# Patient Record
Sex: Female | Born: 1955
Health system: Southern US, Community
[De-identification: ages and names within clinical notes are randomized; demographics above are authoritative.]

## PROBLEM LIST (undated history)

## (undated) DIAGNOSIS — N3010 Interstitial cystitis (chronic) without hematuria: Secondary | ICD-10-CM

## (undated) DIAGNOSIS — R112 Nausea with vomiting, unspecified: Secondary | ICD-10-CM

## (undated) DIAGNOSIS — D649 Anemia, unspecified: Secondary | ICD-10-CM

## (undated) DIAGNOSIS — I341 Nonrheumatic mitral (valve) prolapse: Secondary | ICD-10-CM

## (undated) DIAGNOSIS — A6000 Herpesviral infection of urogenital system, unspecified: Secondary | ICD-10-CM

## (undated) DIAGNOSIS — R011 Cardiac murmur, unspecified: Secondary | ICD-10-CM

## (undated) DIAGNOSIS — I499 Cardiac arrhythmia, unspecified: Secondary | ICD-10-CM

## (undated) DIAGNOSIS — Z800 Family history of malignant neoplasm of digestive organs: Secondary | ICD-10-CM

## (undated) DIAGNOSIS — F320 Major depressive disorder, single episode, mild: Secondary | ICD-10-CM

## (undated) DIAGNOSIS — N6009 Solitary cyst of unspecified breast: Secondary | ICD-10-CM

## (undated) DIAGNOSIS — E785 Hyperlipidemia, unspecified: Secondary | ICD-10-CM

## (undated) DIAGNOSIS — C50919 Malignant neoplasm of unspecified site of unspecified female breast: Secondary | ICD-10-CM

## (undated) DIAGNOSIS — K449 Diaphragmatic hernia without obstruction or gangrene: Secondary | ICD-10-CM

## (undated) DIAGNOSIS — E7800 Pure hypercholesterolemia, unspecified: Secondary | ICD-10-CM

## (undated) DIAGNOSIS — F32A Depression, unspecified: Secondary | ICD-10-CM

## (undated) DIAGNOSIS — M797 Fibromyalgia: Secondary | ICD-10-CM

## (undated) DIAGNOSIS — Z9889 Other specified postprocedural states: Secondary | ICD-10-CM

## (undated) DIAGNOSIS — K589 Irritable bowel syndrome without diarrhea: Secondary | ICD-10-CM

## (undated) DIAGNOSIS — M8580 Other specified disorders of bone density and structure, unspecified site: Secondary | ICD-10-CM

## (undated) DIAGNOSIS — K649 Unspecified hemorrhoids: Secondary | ICD-10-CM

## (undated) DIAGNOSIS — Z8041 Family history of malignant neoplasm of ovary: Secondary | ICD-10-CM

## (undated) DIAGNOSIS — Z803 Family history of malignant neoplasm of breast: Secondary | ICD-10-CM

## (undated) DIAGNOSIS — Z806 Family history of leukemia: Secondary | ICD-10-CM

## (undated) DIAGNOSIS — Z1371 Encounter for nonprocreative screening for genetic disease carrier status: Secondary | ICD-10-CM

## (undated) DIAGNOSIS — K219 Gastro-esophageal reflux disease without esophagitis: Secondary | ICD-10-CM

## (undated) DIAGNOSIS — F419 Anxiety disorder, unspecified: Secondary | ICD-10-CM

## (undated) HISTORY — PX: BREAST EXCISIONAL BIOPSY: SUR124

## (undated) HISTORY — DX: Major depressive disorder, single episode, mild: F32.0

## (undated) HISTORY — PX: EXPLORATORY LAPAROTOMY: SUR591

## (undated) HISTORY — DX: Gastro-esophageal reflux disease without esophagitis: K21.9

## (undated) HISTORY — PX: ABDOMINAL HYSTERECTOMY: SHX81

## (undated) HISTORY — DX: Encounter for nonprocreative screening for genetic disease carrier status: Z13.71

## (undated) HISTORY — DX: Family history of malignant neoplasm of ovary: Z80.41

## (undated) HISTORY — DX: Cardiac murmur, unspecified: R01.1

## (undated) HISTORY — DX: Family history of malignant neoplasm of digestive organs: Z80.0

## (undated) HISTORY — DX: Family history of leukemia: Z80.6

## (undated) HISTORY — DX: Depression, unspecified: F32.A

## (undated) HISTORY — DX: Family history of malignant neoplasm of breast: Z80.3

## (undated) HISTORY — PX: TUBAL LIGATION: SHX77

## (undated) HISTORY — DX: Hyperlipidemia, unspecified: E78.5

## (undated) HISTORY — DX: Irritable bowel syndrome, unspecified: K58.9

## (undated) HISTORY — DX: Diaphragmatic hernia without obstruction or gangrene: K44.9

## (undated) HISTORY — PX: OTHER SURGICAL HISTORY: SHX169

## (undated) HISTORY — PX: APPENDECTOMY: SHX54

## (undated) HISTORY — DX: Herpesviral infection of urogenital system, unspecified: A60.00

## (undated) HISTORY — DX: Nonrheumatic mitral (valve) prolapse: I34.1

## (undated) HISTORY — DX: Pure hypercholesterolemia, unspecified: E78.00

## (undated) HISTORY — DX: Other specified disorders of bone density and structure, unspecified site: M85.80

## (undated) HISTORY — PX: BLADDER SURGERY: SHX569

## (undated) HISTORY — DX: Unspecified hemorrhoids: K64.9

## (undated) HISTORY — DX: Solitary cyst of unspecified breast: N60.09

## (undated) HISTORY — DX: Fibromyalgia: M79.7

## (undated) HISTORY — DX: Interstitial cystitis (chronic) without hematuria: N30.10

## (undated) HISTORY — DX: Anxiety disorder, unspecified: F41.9

---
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
## 2004-11-29 ENCOUNTER — Ambulatory Visit: Payer: Self-pay | Admitting: Family Medicine

## 2004-12-20 ENCOUNTER — Ambulatory Visit: Payer: Self-pay | Admitting: Unknown Physician Specialty

## 2004-12-30 ENCOUNTER — Ambulatory Visit: Payer: Self-pay | Admitting: Unknown Physician Specialty

## 2005-01-16 ENCOUNTER — Ambulatory Visit: Payer: Self-pay

## 2005-05-25 ENCOUNTER — Ambulatory Visit: Payer: Self-pay | Admitting: Family Medicine

## 2005-09-04 ENCOUNTER — Ambulatory Visit: Payer: Self-pay | Admitting: Family Medicine

## 2005-11-03 ENCOUNTER — Ambulatory Visit: Payer: Self-pay | Admitting: General Surgery

## 2005-12-19 ENCOUNTER — Ambulatory Visit: Payer: Self-pay | Admitting: Family Medicine

## 2006-01-10 ENCOUNTER — Ambulatory Visit: Payer: Self-pay | Admitting: Urology

## 2006-01-17 ENCOUNTER — Ambulatory Visit: Payer: Self-pay | Admitting: Family Medicine

## 2006-08-02 ENCOUNTER — Ambulatory Visit: Payer: Self-pay | Admitting: General Surgery

## 2006-11-19 ENCOUNTER — Ambulatory Visit: Payer: Self-pay | Admitting: General Surgery

## 2006-12-06 ENCOUNTER — Ambulatory Visit: Payer: Self-pay | Admitting: Family Medicine

## 2007-03-07 ENCOUNTER — Ambulatory Visit: Payer: Self-pay | Admitting: Unknown Physician Specialty

## 2007-03-26 ENCOUNTER — Telehealth: Payer: Self-pay | Admitting: Family Medicine

## 2007-06-06 ENCOUNTER — Ambulatory Visit: Payer: Self-pay | Admitting: Unknown Physician Specialty

## 2007-07-11 ENCOUNTER — Telehealth: Payer: Self-pay | Admitting: Family Medicine

## 2007-07-30 ENCOUNTER — Ambulatory Visit: Payer: Self-pay | Admitting: Family Medicine

## 2007-07-30 DIAGNOSIS — R42 Dizziness and giddiness: Secondary | ICD-10-CM | POA: Insufficient documentation

## 2007-07-30 DIAGNOSIS — R5381 Other malaise: Secondary | ICD-10-CM | POA: Insufficient documentation

## 2007-07-30 DIAGNOSIS — R5383 Other fatigue: Secondary | ICD-10-CM

## 2007-08-05 ENCOUNTER — Encounter: Payer: Self-pay | Admitting: Family Medicine

## 2007-11-04 ENCOUNTER — Ambulatory Visit: Payer: Self-pay | Admitting: General Surgery

## 2007-11-19 ENCOUNTER — Encounter: Payer: Self-pay | Admitting: Family Medicine

## 2007-11-28 HISTORY — PX: COLONOSCOPY: SHX174

## 2008-01-01 ENCOUNTER — Encounter: Payer: Self-pay | Admitting: Family Medicine

## 2008-02-06 ENCOUNTER — Encounter: Payer: Self-pay | Admitting: Family Medicine

## 2008-02-12 DIAGNOSIS — K589 Irritable bowel syndrome without diarrhea: Secondary | ICD-10-CM | POA: Insufficient documentation

## 2008-02-28 ENCOUNTER — Telehealth: Payer: Self-pay | Admitting: Family Medicine

## 2008-08-17 ENCOUNTER — Telehealth: Payer: Self-pay | Admitting: Family Medicine

## 2008-09-03 ENCOUNTER — Encounter: Payer: Self-pay | Admitting: Family Medicine

## 2008-09-10 ENCOUNTER — Ambulatory Visit: Payer: Self-pay | Admitting: Family Medicine

## 2008-09-10 DIAGNOSIS — E559 Vitamin D deficiency, unspecified: Secondary | ICD-10-CM | POA: Insufficient documentation

## 2008-09-10 DIAGNOSIS — E785 Hyperlipidemia, unspecified: Secondary | ICD-10-CM | POA: Insufficient documentation

## 2008-11-02 ENCOUNTER — Ambulatory Visit: Payer: Self-pay | Admitting: General Surgery

## 2008-11-06 ENCOUNTER — Encounter: Payer: Self-pay | Admitting: Family Medicine

## 2008-11-17 ENCOUNTER — Encounter: Payer: Self-pay | Admitting: Family Medicine

## 2008-12-03 ENCOUNTER — Ambulatory Visit: Payer: Self-pay | Admitting: Family Medicine

## 2008-12-03 DIAGNOSIS — N6019 Diffuse cystic mastopathy of unspecified breast: Secondary | ICD-10-CM | POA: Insufficient documentation

## 2008-12-03 DIAGNOSIS — R002 Palpitations: Secondary | ICD-10-CM | POA: Insufficient documentation

## 2009-01-04 ENCOUNTER — Encounter: Payer: Self-pay | Admitting: Family Medicine

## 2009-01-25 ENCOUNTER — Encounter: Payer: Self-pay | Admitting: Family Medicine

## 2009-02-15 ENCOUNTER — Ambulatory Visit: Payer: Self-pay | Admitting: Family Medicine

## 2009-02-17 ENCOUNTER — Telehealth: Payer: Self-pay | Admitting: Family Medicine

## 2009-03-22 ENCOUNTER — Encounter: Payer: Self-pay | Admitting: Family Medicine

## 2009-03-22 ENCOUNTER — Ambulatory Visit: Payer: Self-pay | Admitting: Unknown Physician Specialty

## 2009-03-22 LAB — HM COLONOSCOPY

## 2009-04-27 ENCOUNTER — Ambulatory Visit: Payer: Self-pay | Admitting: Family Medicine

## 2009-04-27 DIAGNOSIS — R1013 Epigastric pain: Secondary | ICD-10-CM | POA: Insufficient documentation

## 2009-04-27 DIAGNOSIS — R030 Elevated blood-pressure reading, without diagnosis of hypertension: Secondary | ICD-10-CM | POA: Insufficient documentation

## 2009-05-18 ENCOUNTER — Ambulatory Visit: Payer: Self-pay | Admitting: Gastroenterology

## 2009-05-26 ENCOUNTER — Ambulatory Visit: Payer: Self-pay | Admitting: Unknown Physician Specialty

## 2009-06-10 ENCOUNTER — Encounter: Payer: Self-pay | Admitting: Family Medicine

## 2009-06-21 ENCOUNTER — Ambulatory Visit: Payer: Self-pay | Admitting: General Surgery

## 2009-06-24 ENCOUNTER — Encounter: Payer: Self-pay | Admitting: Family Medicine

## 2009-06-30 ENCOUNTER — Encounter: Payer: Self-pay | Admitting: Family Medicine

## 2009-07-08 ENCOUNTER — Ambulatory Visit: Payer: Self-pay | Admitting: Unknown Physician Specialty

## 2009-08-13 ENCOUNTER — Telehealth: Payer: Self-pay | Admitting: Family Medicine

## 2009-10-11 ENCOUNTER — Ambulatory Visit: Payer: Self-pay | Admitting: Family Medicine

## 2009-11-05 ENCOUNTER — Ambulatory Visit: Payer: Self-pay | Admitting: General Surgery

## 2009-11-16 ENCOUNTER — Encounter: Payer: Self-pay | Admitting: Family Medicine

## 2009-11-18 ENCOUNTER — Encounter: Payer: Self-pay | Admitting: Family Medicine

## 2009-12-01 ENCOUNTER — Telehealth: Payer: Self-pay | Admitting: Family Medicine

## 2009-12-22 ENCOUNTER — Ambulatory Visit: Payer: Self-pay | Admitting: Family Medicine

## 2010-03-21 ENCOUNTER — Encounter: Payer: Self-pay | Admitting: Family Medicine

## 2010-03-23 ENCOUNTER — Telehealth: Payer: Self-pay | Admitting: Family Medicine

## 2010-04-14 ENCOUNTER — Encounter: Payer: Self-pay | Admitting: Family Medicine

## 2010-07-04 ENCOUNTER — Encounter (INDEPENDENT_AMBULATORY_CARE_PROVIDER_SITE_OTHER): Payer: Self-pay | Admitting: *Deleted

## 2010-07-29 ENCOUNTER — Telehealth: Payer: Self-pay | Admitting: Family Medicine

## 2010-09-30 ENCOUNTER — Encounter: Payer: Self-pay | Admitting: Family Medicine

## 2010-10-04 ENCOUNTER — Encounter: Payer: Self-pay | Admitting: Family Medicine

## 2010-10-06 ENCOUNTER — Ambulatory Visit: Payer: Self-pay | Admitting: Family Medicine

## 2010-10-06 DIAGNOSIS — F419 Anxiety disorder, unspecified: Secondary | ICD-10-CM

## 2010-10-06 DIAGNOSIS — F329 Major depressive disorder, single episode, unspecified: Secondary | ICD-10-CM | POA: Insufficient documentation

## 2010-10-06 DIAGNOSIS — F32A Depression, unspecified: Secondary | ICD-10-CM | POA: Insufficient documentation

## 2010-11-09 ENCOUNTER — Ambulatory Visit: Payer: Self-pay | Admitting: General Surgery

## 2010-11-17 ENCOUNTER — Encounter: Payer: Self-pay | Admitting: Family Medicine

## 2010-12-25 LAB — CONVERTED CEMR LAB
ALT: 13 units/L
AST: 17 units/L
BUN: 12 mg/dL
Cholesterol: 229 mg/dL
Creatinine, Ser: 0.84 mg/dL
Glucose, Bld: 91 mg/dL
HDL: 55 mg/dL
LDL Cholesterol: 156 mg/dL
Potassium: 3.8 meq/L
Sodium: 141 meq/L
Total Protein: 7.2 g/dL
Triglycerides: 90 mg/dL
Vit D, 1,25-Dihydroxy: 29.6

## 2010-12-28 NOTE — Progress Notes (Signed)
Summary: needs order for labs  Phone Note Call from Patient Call back at Work Phone [phone] Call back at [phone]   Caller: Patient Call For: Shaune Leeks MD Summary of Call: Pt needs order for labs prior to her physical to take to [facility].  She asks that this be mailed to her. Initial call taken by: Lowella Petties CMA,  August 13, 2009 1:26 PM  Follow-up for Phone Call        Done. Follow-up by: Shaune Leeks MD,  August 16, 2009 7:22 AM  Additional Follow-up for Phone Call Additional follow up Details #1::        Order mailed to patient. Additional Follow-up by: Lowella Petties CMA,  August 16, 2009 8:30 AM

## 2010-12-28 NOTE — Assessment & Plan Note (Signed)
Summary: weak,blood pressure low/bir   Vital Signs:  Patient Profile:   55 Years Old Female Weight:      142 pounds Temp:     98.6 degrees F oral Pulse rate:   76 / minute Pulse rhythm:   regular BP sitting:   120 / 80  (left arm) Cuff size:   regular  Vitals Entered By: Providence Crosby (July 30, 2007 3:05 PM)                 Chief Complaint:  complains of feeling week// bp down sat 90/60.  History of Present Illness: Not feeling well, tired and had BP checked in drug store which was 90/60.  Is very stressed at work and thought hr pressure would be high, but was normal when checked here. Hurts across her shoulders a lot,and sometimes in the upper chest, but bilaterally. It is not particularly exertional. She is significantly fatigued. She is seeing Dr Birder Robson who is helping her deal with things. She also has multiple stomach aches and pains with h/o elevated pancreatic enzymes. She sees Dr Mechele Collin for Gi probs and sees Dr Lemar Livings for [facility].  Current Allergies (reviewed today): ! PENICILLIN ! [facility] ! SULFA ! CELEBREX ! * ? CIPRO      Physical Exam  General:     Well-developed,well-nourished,in no acute distress; alert,appropriate and cooperative throughout examination. Head:     Normocephalic and atraumatic without obvious abnormalities. No apparent alopecia or balding. Eyes:     Conjunctiva clear bilaterally.  Ears:     External ear exam shows no significant lesions or deformities.  Otoscopic examination reveals clear canals, tympanic membranes are intact bilaterally without bulging, retraction, inflammation or discharge. Hearing is grossly normal bilaterally. Nose:     External nasal examination shows no deformity or inflammation. Nasal mucosa are pink and moist without lesions or exudates. Mouth:     Oral mucosa and oropharynx without lesions or exudates.  Teeth in good repair. Neck:     No deformities, masses, or tenderness noted. Chest Wall:    No deformities, masses, or tenderness noted. Lungs:     Normal respiratory effort, chest expands symmetrically. Lungs are clear to auscultation, no crackles or wheezes. Heart:     Normal rate and regular rhythm. S1 and S2 normal without gallop, murmur, click, rub or other extra sounds. Abdomen:     Bowel sounds positive,abdomen soft and non-tender without masses, organomegaly or hernias noted.    Impression & Recommendations:  Problem # 1:  FATIGUE (ICD-780.79) Assessment: New Reasonably new although this has been an off and on thing for years...will do lab eval before labelling it depression and anxiety...encouraged  regular exercise with watching her diet carefully, to start slow and increase intensity and duration.  Problem # 2:  DIZZINESS (ICD-780.4) Assessment: New ?hypotension...stop inderal. Insure no anemic or hypothyroid. Her updated medication list for this problem includes:    Allegra 180 Mg Tabs (Fexofenadine hcl) .[name]... 1 once daily prn   Complete Medication List: 1)  Bentyl 10 Mg Caps (Dicyclomine hcl) .[name].. 1 by mouth qid prn 2)  Tofranil 50 Mg Tabs (Imipramine hcl) .[name].. 1 q hs 3)  Tranxene-t 7.5 Mg Tabs (Clorazepate dipotassium) .[name].. 1 bid 4)  Allegra 180 Mg Tabs (Fexofenadine hcl) .[name].. 1 once daily prn   Patient Instructions: 1)  Will report labs via phone tree. 2)  Stop Inderal. 3)  RTC if sxs continue.

## 2010-12-28 NOTE — Assessment & Plan Note (Signed)
Summary: f/u labs/whc   Vital Signs:  Patient Profile:   55 Years Old Female Weight:      145 pounds Temp:     97.7 degrees F Pulse rate:   76 / minute BP sitting:   110 / 60  (left arm) Cuff size:   regular  Vitals Entered By: Providence Crosby (September 10, 2008 3:45 PM)                 Chief Complaint:  followup labs.  History of Present Illness: Pt's brother had surgery last month for fungal abscess of chest with lots of postoperative complications. As usual she bears the responsibility for that and other things. 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 She seems to be doing remarkably well considering all she has on her and knowing her usual state of anxiousness. She has some abdominal discomfort but is fairly unpredictable. She otherwise seems to be mildly fatigued from not sleeping real well but handling thing generally ok. She just had labs done and is here to discuss them.    Prior Medications Reviewed Using: Patient Recall  Current Allergies (reviewed today): ! PENICILLIN ! [facility] ! SULFA ! CELEBREX ! * ? CIPRO      Physical Exam  General:     Well-developed,well-nourished,in no acute distress; alert,appropriate and cooperative throughout examination Head:     Normocephalic and atraumatic without obvious abnormalities. No apparent alopecia or balding. Eyes:     Conjunctiva clear bilaterally.  Ears:     External ear exam shows no significant lesions or deformities.  Otoscopic examination reveals clear canals, tympanic membranes are intact bilaterally without bulging, retraction, inflammation or discharge. Hearing is grossly normal bilaterally. Nose:     External nasal examination shows no deformity or inflammation. Nasal mucosa are pink and moist without lesions or exudates. Mouth:     Oral mucosa and oropharynx without lesions or exudates.  Teeth in good repair. Neck:     No deformities, masses, or tenderness noted. Chest Wall:      No deformities, masses, or tenderness noted. Lungs:     Normal respiratory effort, chest expands symmetrically. Lungs are clear to auscultation, no crackles or wheezes. Heart:     Normal rate and regular rhythm. S1 and S2 normal without gallop, murmur, click, rub or other extra sounds. Abdomen:     Bowel sounds positive,abdomen soft and non-tender without masses, organomegaly or hernias noted.    Impression & Recommendations:  Problem # 1:  IRRITABLE BOWEL SYNDROME (ICD-564.1) Assessment: Unchanged HAs been under lots of stress with some abd discomfort but doing reasonably well. Discussed staying regular and on schedule with eating, exercising and maintaining as she needs to to go to the [place] with BMs regularly tocontrol her IBS.  Problem # 2:  FATIGUE (ICD-780.79) Assessment: Unchanged Chronic but definitely could be impacted by Vit D deficiency.  Problem # 3:  UNSPECIFIED VITAMIN D DEFICIENCY (ICD-268.9) Assessment: New Start Vit D replacement and recheck in 2 mos.  Problem # 4:  HYPERLIPIDEMIA (ICD-272.4) Assessment: Unchanged Tot 221 Trig 90 HDL 49 LDL 154....goal LDL 130, avoid fatty foods as discussed and repeat in future. Pt declines meds at this point.  Complete Medication List: 1)  Tofranil 50 Mg Tabs (Imipramine hcl) .[name].. 1 q hs 2)  Tranxene-t 7.5 Mg Tabs (Clorazepate dipotassium) .[name].. 1 bid 3)  Propranolol Hcl 10 Mg Tabs (Propranolol hcl) .... One tab by mouth qd 4)  Progesterone 1000 Mg/60gm Crea (  Misc natural products) .... As needed hot flashes 5)  Calcium 1000mg / Magnesium 500mg  and Vitamin D 400mg   .... Takes 3 tablet daily 6)  Vitamin D 40981 Unit Caps (Ergocalciferol) .... One tab by mouth once a week for four weeks, then one every other week for four weeks.   Patient Instructions: 1)  RTC 2 mos, Labs at [facility] prior.   Prescriptions:  VITAMIN D 19147 UNIT CAPS (ERGOCALCIFEROL) one tab by mouth once a week for four weeks, then one every other week for four weeks.  #4 x 1   Entered and Authorized by:   Shaune Leeks MD   Signed by:   Shaune Leeks MD on 09/10/2008   Method used:   Print then Give to Patient   RxID:   [id number]  ]

## 2010-12-28 NOTE — Consult Note (Signed)
Summary[facility]Dr[facility]Dr[facility] Lemar Livings   Imported By: Eleonore Chiquito 11/24/2008 15:47:24  _____________________________________________________________________  External Attachment:    Type:   Image     Comment:   External Document

## 2010-12-28 NOTE — Assessment & Plan Note (Signed)
Summary: to discuss labs, med refill/[facility] from 12/14/09- RL   Vital Signs:  Patient profile:   55 year old female Weight:      147 pounds Temp:     98.3 degrees F oral Pulse rate:   80 / minute Pulse rhythm:   regular BP sitting:   128 / 72  (left arm) Cuff size:   regular  Vitals Entered By: Sydell Axon LPN (December 22, 2009 3:36 PM) CC: Follow-up on lab results   History of Present Illness: Pt here fopr followup of Vit D and other typical HM labs. She is very tired but her brother is quite sick and she spwends a lot of time with him...yesterday got home from attending to him to get in bed at 1AM and got up at [facility] to go to work. She tries to eat healthily, avoidiing fatty foods. Other than fatigue, she feels pretty good.  Problems Prior to Update: 1)  Elevated Blood Pressure Without Diagnosis of Hypertension  (ICD-796.2) 2)  Abdominal Pain, Epigastric  (ICD-789.06) 3)  Breast Cysts, Bilateral  (ICD-610.1) 4)  Palpitations, Chronic  (ICD-785.1) 5)  Hyperlipidemia  (ICD-272.4) 6)  Unspecified Vitamin D Deficiency  (ICD-268.9) 7)  Irritable Bowel Syndrome  (ICD-564.1) 8)  Dizziness  (ICD-780.4) 9)  Fatigue  (ICD-780.79)  Medications Prior to Update: 1)  Tofranil 10 Mg Tabs (Imipramine Hcl) .... 30mg  Daily By Mouth 2)  Tranxene-T 7.5 Mg  Tabs (Clorazepate Dipotassium) .[name].. 1 Tablet Twice A Day 3)  Progesterone 1000 Mg/60gm Crea (Misc Natural Products) .... As Needed Hot Flashes 4)  Calcium 1000mg / Magnesium 500mg  and Vitamin D 400mg  .... Takes 3 Tablet Daily 5)  Vitamin D 1000 Unit Tabs (Cholecalciferol) .[name].. 1 Tablet Twice A Day By Mouth 6)  Doxycycline Hyclate 100 Mg Caps (Doxycycline Hyclate) .... One Tab By Mouth Two Times A Day  Allergies: 1)  ! Penicillin 2)  ! * Emycin 3)  ! Sulfa 4)  ! Celebrex  Physical Exam  General:  Well-developed,well-nourished,in no acute distress; alert,appropriate and cooperative throughout examination, nontoxic. Head:  Normocephalic  and atraumatic without obvious abnormalities. No apparent alopecia or balding. Sinuses nontender. Eyes:  Conjunctiva clear Ears:  External ear exam shows no significant lesions or deformities.  Otoscopic examination reveals clear canals, tympanic membranes are intact bilaterally without bulging, retraction, inflammation or discharge. Hearing is grossly normal bilaterally. Cerumen bilat. Nose:  External nasal examination shows no deformity or inflammation. Nasal mucosa are pink and moist without lesions but mild thick exudates. Mouth:  Oral mucosa and oropharynx without lesions or exudates.  Teeth in good repair. Mild clear but thick PND. Neck:  No deformities, masses, or tenderness noted. Lungs:  Normal respiratory effort, chest expands symmetrically. Lungs are clear to auscultation, no crackles or wheezes. Heart:  Normal rate and regular rhythm. S1 and S2 normal without gallop, murmur, click, rub or other extra sounds.   Impression & Recommendations:  Problem # 1:  ELEVATED BLOOD PRESSURE WITHOUT DIAGNOSIS OF HYPERTENSION (ICD-796.2) Assessment Improved Stable. BP today: 128/72 Prior BP: 138/78 (10/11/2009)  Instructed in low sodium diet (DASH Handout) and behavior modification.    Problem # 2:  HYPERLIPIDEMIA (ICD-272.4) Trigs 86 and HDL 59 ...great LDL 152 which iss high and needs to be 130 or less. Probably attainable by diet manipulation.   Problem # 3:  UNSPECIFIED VITAMIN D DEFICIENCY (ICD-268.9) Assessment: Deteriorated Worsened again to 19.5. Will start her on 2000Iu two times a day...she has the pills but hasn't been taking them.  She has known about this previously.  Problem # 4:  FATIGUE (ICD-780.79) Assessment: Unchanged Will improve if can get Vit D successfully replaced.  Complete Medication List: 1)  Tofranil 10 Mg Tabs (Imipramine hcl) .... 30mg  daily by mouth 2)  Tranxene-t 7.5 Mg Tabs (Clorazepate dipotassium) .[name].. 1 tablet twice a day 3)  Progesterone 1000 Mg/60gm  Crea (Misc natural products) .... As needed hot flashes  Patient Instructions: 1)  RTC 3 mos, Vit D lvl 268 prior. (Given script for [facility])  Current Allergies (reviewed today): ! PENICILLIN ! [facility] ! SULFA ! CELEBREX

## 2010-12-28 NOTE — Medication Information (Signed)
Summary: [facility] Prior Auth Form -Valtrex  [facility] Prior Auth Form -Valtrex   Imported By: Beau Fanny 01/01/2008 15:18:30  _____________________________________________________________________  External Attachment:    Type:   Image     Comment:   External Document

## 2010-12-28 NOTE — Letter (Signed)
Summary: Dr.Jeffrey Byrnett,[facility]Note  Dr.Jeffrey Byrnett,[facility]Note   Imported By: Beau Fanny 04/18/2010 09:35:06  _____________________________________________________________________  External Attachment:    Type:   Image     Comment:   External Document

## 2010-12-28 NOTE — Letter (Signed)
Summary: Dr.Jeffrey Byrnett,Surgeon,Note  Dr.Jeffrey Byrnett,Surgeon,Note   Imported By: Beau Fanny 11/22/2009 09:45:47  _____________________________________________________________________  External Attachment:    Type:   Image     Comment:   External Document

## 2010-12-28 NOTE — Assessment & Plan Note (Signed)
Summary: NOT FEELING WELL AFTER GETTING OF BP MEDS / LFW   Vital Signs:  Patient profile:   55 year old female Height:      67 inches Weight:      146 pounds BMI:     22.95 Temp:     98.4 degrees F oral Pulse rate:   80 / minute Pulse rhythm:   regular BP sitting:   140 / 80  (left arm) Cuff size:   regular  Vitals Entered By: Providence Crosby (April 27, 2009 4:04 PM) CC: not feeling well// bp cuff at home shows blood pressure 106/59 at home also thinks she is having gallbladder problem   History of Present Illness: Pt here for having stopped her BP meds about 2 mos ago. It is elevated here today but is typically 100/60 at home. She does not hink her BP has been right since she took Flomax for difficulty urinating with a UTI in the past. She is tired all time. She is havin g "Lots of stomach problems." She is having RUQ pain esp with eating. She has HH with swelling in the periepigastric area. Dr Mechele Collin sent pt to have her gallbladder taken out to Dr Lemar Livings who did not want to do it. Dr Mechele Collin wants to send he to someone to have her gallbladder taken out. She has known IBS and major complaint ins periepigastric discomfort and bloating. She has been able to tease out some things thart bother her such as Green tea, altho she likes it and is not willing to stop it. She goes off of it for a while. We discussed risk of BP, both elevation and depression. She had stopped her 10mg  of Propranolol due to low BP but has now noticed palpitations. Her BP here today is on the lwest end of elevated. Would cont to monitor. She seems to have no acute negative sxs from her current state.  Problems Prior to Update: 1)  Breast Cysts, Bilateral  (ICD-610.1) 2)  Palpitations, Chronic  (ICD-785.1) 3)  Hyperlipidemia  (ICD-272.4) 4)  Unspecified Vitamin D Deficiency  (ICD-268.9) 5)  Irritable Bowel Syndrome  (ICD-564.1) 6)  Dizziness  (ICD-780.4) 7)  Fatigue  (ICD-780.79)  Medications Prior to Update:  1)  Tofranil 10 Mg Tabs (Imipramine Hcl) .... 30mg  Daily By Mouth 2)  Tranxene-T 7.5 Mg  Tabs (Clorazepate Dipotassium) .[name].. 1 Tablet Twice A Day 3)  Propranolol Hcl 10 Mg  Tabs (Propranolol Hcl) .... One Tab By Mouth Daily 4)  Progesterone 1000 Mg/60gm Crea (Misc Natural Products) .... As Needed Hot Flashes 5)  Calcium 1000mg / Magnesium 500mg  and Vitamin D 400mg  .... Takes 3 Tablet Daily 6)  Vitamin D 1000 Unit Tabs (Cholecalciferol) .[name].. 1 Tablet Twice A Day By Mouth 7)  Cipro 500 Mg Tabs (Ciprofloxacin Hcl) .... Just Finished A Round of Abx  Allergies: 1)  ! Penicillin 2)  ! * Emycin 3)  ! Sulfa 4)  ! Celebrex 5)  ! * ? Cipro  Physical Exam  General:  Well-developed,well-nourished,in no acute distress; alert,appropriate and cooperative throughout examination, nontoxic. Head:  Normocephalic and atraumatic without obvious abnormalities. No apparent alopecia or balding.Sinuses nontender. Eyes:  Conjunctiva clear Ears:  External ear exam shows no significant lesions or deformities.  Otoscopic examination reveals clear canals, tympanic membranes are intact bilaterally without bulging, retraction, inflammation or discharge. Hearing is grossly normal bilaterally. Cerumen bilat. Nose:  External nasal examination shows no deformity or inflammation. Nasal mucosa are pink and moist without lesions or  exudates. Mouth:  Oral mucosa and oropharynx without lesions or exudates.  Teeth in good repair. Mild clear but thick PND. Neck:  No deformities, masses, or tenderness noted. Chest Wall:  No deformities, masses, or tenderness noted. Lungs:  Normal respiratory effort, chest expands symmetrically. Lungs are clear to auscultation, no crackles or wheezes. Heart:  Normal rate and regular rhythm. S1 and S2 normal without gallop, murmur, click, rub or other extra sounds.  Abdomen:  Bowel sounds positive,abdomen soft and non-tender without masses, organomegaly or hernias noted. Pt feels like she has mass in LLQ which I do not feel. She is tympanitic throughout the abdomen.   Impression & Recommendations:  Problem # 1:  PALPITATIONS, CHRONIC (ICD-785.1) Assessment Deteriorated  Slightly more prevalent than when on Propranolo. To be expected. Discussed. Does not seem worthwhile going back on meds at this point for this. The following medications were removed from the medication list:    Propranolol Hcl 10 Mg Tabs (Propranolol hcl) ..... One tab by mouth daily  Avoid caffeine, chocolate, and stimulants. Stress reduction as well as medication options discussed.   Problem # 2:  IRRITABLE BOWEL SYNDROME (ICD-564.1) Assessment: Unchanged I think this is the etiology of most iof her abdominal complaints. Her exam is benign except for distention and tympany. We discussed regularity of approach and stressed consistency.  Problem # 3:  FATIGUE (ICD-780.79) Assessment: Unchanged Chronic problem, probably most related to sleep or lack thereof...discussede hygiene at length.  Problem # 4:  ABDOMINAL PAIN, EPIGASTRIC (ICD-789.06) Assessment: [facility] but seems more IBS related with bloating and tympany...discussed trying multiple small things that if help, can add up to large things.  Problem # 5:  ELEVATED BLOOD PRESSURE WITHOUT DIAGNOSIS OF HYPERTENSION (ICD-796.2) Assessment: New  Poss result of acutely stopping beta blocker, even at low dose. Will follow. Discussed parameters for which to return. The following medications were removed from the medication list:    Propranolol Hcl 10 Mg Tabs (Propranolol hcl) ..... One tab by mouth daily  BP today: 140/80 Prior BP: 130/80 (02/15/2009)  Instructed in low sodium diet (DASH Handout) and behavior modification.    Complete Medication List: 1)  Tofranil 10 Mg Tabs (Imipramine hcl) .... 30mg  daily by mouth  2)  Tranxene-t 7.5 Mg Tabs (Clorazepate dipotassium) .[name].. 1 tablet twice a day 3)  Progesterone 1000 Mg/60gm Crea (Misc natural products) .... As needed hot flashes 4)  Calcium 1000mg / Magnesium 500mg  and Vitamin D 400mg   .... Takes 3 tablet daily 5)  Vitamin D 1000 Unit Tabs (Cholecalciferol) .[name].. 1 tablet twice a day by mouth 6)  Cipro 500 Mg Tabs (Ciprofloxacin hcl) .... Just finished a round of abx  Patient Instructions: 1)  RTC as discussed as needed.

## 2010-12-28 NOTE — Progress Notes (Signed)
Summary: New RX for BP Medication Called in [place]!  Phone Note Call from Patient Call back at [phone]   Caller: Patient Summary of Call: Pt needs new RX for BP medication. She used to get it mail order but she does not want it that way anymore. She wants it called into [facility] pharmacy  [phone] in [place]. The Medication in Propranolo (She says). Initial call taken by: Mickle Asper,  February 28, 2008 9:17 AM    New/Updated Medications: PROPRANOLOL HCL 10 MG  TABS (PROPRANOLOL HCL) one tab by mouth qd   Prescriptions: PROPRANOLOL HCL 10 MG  TABS (PROPRANOLOL HCL) one tab by mouth qd  #30 x 12   Entered and Authorized by:   Shaune Leeks MD   Signed by:   Shaune Leeks MD on 02/28/2008   Method used:   Electronically sent to ...       [facility]*       [address]       [place], [place]  [address]       Ph: [phone]       Fax: [phone]   RxID:   [id number]

## 2010-12-28 NOTE — Progress Notes (Signed)
Summary: Lab results  Phone Note Call from Patient Call back at [phone] work   Caller: Patient Call For: Shaune Leeks MD Summary of Call: pt calling wanting lab results, I advised pt that she needs to make an physical appt to discuss these results with you. Pt states she has already had her mammo and pap, does she really need an appt? and does it need to be a physical? can it be a appt to discuss labs? Initial call taken by: Mervin Hack CMA Duncan Dull),  December 01, 2009 10:53 AM  Follow-up for Phone Call        If that is what she wants, that is what she will get.Marland KitchenMarland KitchenMake appt for  15 mins and she will get medication refills ONLY. Follow-up by: Shaune Leeks MD,  December 01, 2009 11:37 AM  Additional Follow-up for Phone Call Additional follow up Details #1::        Appointment scheduled for 12/14/2009.   Additional Follow-up by: Linde Gillis CMA (AAMA),  December 01, 2009 2:40 PM

## 2010-12-28 NOTE — Procedures (Signed)
Summary: [facility] / F/U COLONOSCOPY / DR. Lynnae Prude  [facility] / F/U COLONOSCOPY / DR. Molly Maduro ELLIOTT   Imported By: Carin Primrose 02/25/2009 16:41:01  _____________________________________________________________________  External Attachment:    Type:   Image     Comment:   External Document

## 2010-12-28 NOTE — Progress Notes (Signed)
Summary: PHONE NOTE Debra Little)  Phone Note Call from Patient Call back at Work Phone [phone]   Caller: Patient Call For: [facility] Summary of Call: NEEDS TO KNOW WHAT YOU USUALLY GIVE HER FOR BLADDER INFECTIONS, B/C SHE'S ALLERGIC TO ALOT OF OTHER MEDS. Initial call taken by: Debra Little,  March 26, 2007 11:58 AM  Follow-up for Phone Call        PT WAS CALLED ADVISED WHE WAS LAT GIVEN MACROBID, BUT THAT WAS CHANGED TO KEFLEX 500MG . Follow-up by: Debra Little,  March 26, 2007 2:11 PM

## 2010-12-28 NOTE — Progress Notes (Signed)
Summary: ? Tick bite  Phone Note Call from Patient Call back at [phone] work   Caller: Patient Call For: Shaune Leeks MD Summary of Call: Found a tick on her this morning.  It was stuck to her but she pulled it off with her fingers.  Head and everything still intact and it was still alive.  Area where tick was is red, she is having no symptoms.  What should she do?  Uses [facility].  Initial call taken by: Linde Gillis CMA Duncan Dull),  March 23, 2010 8:35 AM  Follow-up for Phone Call        Continue to [facility]. Apply Neosporin and use icwe every hour today and tomm. Come in if develops rash. Follow-up by: Shaune Leeks MD,  March 23, 2010 9:43 AM  Additional Follow-up for Phone Call Additional follow up Details #1::        Patient advised as instructed. Additional Follow-up by: Linde Gillis CMA Duncan Dull),  March 23, 2010 10:04 AM

## 2010-12-28 NOTE — Progress Notes (Signed)
Summary: restart med (fyi)  Phone Note Call from Patient Call back at Work Phone [phone]   Caller: Patient Call For: Callaway Hardigree Reason for Call: Referral Summary of Call: pt was on  dicyclomine hcl 10mg  caps 2-4 caps daily, she d/c in 07 but wants to restart it Initial call taken by: Liane Comber,  July 11, 2007 9:31 AM  Follow-up for Phone Call        I put px for 1 month on EMR for call in Follow-up by: Judith Part MD,  July 11, 2007 1:08 PM  Additional Follow-up for Phone Call Additional follow up Details #1::        Advised patient, Rx called to pharmacy ..................................................................Marland KitchenLiane Comber  July 11, 2007 1:25 PM  Additional Follow-up by: Shaune Leeks MD,  July 15, 2007 8:15 AM    New/Updated Medications: BENTYL 10 MG  CAPS (DICYCLOMINE HCL) 1 by mouth qid prn   Prescriptions: BENTYL 10 MG  CAPS (DICYCLOMINE HCL) 1 by mouth qid prn  #1 month x 0   Entered and Authorized by:   Judith Part MD   Signed by:   Liane Comber on 07/11/2007   Method used:   Telephoned to ...         RxID:   [id number]

## 2010-12-28 NOTE — Assessment & Plan Note (Signed)
Summary: GET ESTABLISHED, REVIEW LABS/ 30 MINS   Vital Signs:  Patient profile:   55 year old female Height:      67 inches Weight:      149.75 pounds BMI:     23.54 Temp:     98.3 degrees F oral Pulse rate:   84 / minute Pulse rhythm:   regular BP sitting:   122 / 74  (left arm) Cuff size:   regular  Vitals Entered By: Delilah Shan CMA  Dull) (10-25-2010 3:38 PM) CC: Establish from RNS - Review labs   History of Present Illness: H/o anxiety and palpitations.  Some relief with meds.  Longstanding.  Continued stress at home (brother's chronic illness) and work.    ?of fibromyalgia.  Longstanding aching in muscles.  Not on formal tx for this yet, but is planning on starting neurontin in the near future.  HLD- labs reviewed wtih patient.  see plan.  h/o low vit D= labs reviewed with patient.  prev wtih DXA done by gyn.    Allergies: 1)  ! Penicillin 2)  ! * Emycin 3)  ! Sulfa 4)  ! Celebrex  Past History:  Family History: Last updated: 10-25-10 M dead cancer, breast F dead cancer, colon  Social History: Last updated: 10/25/10 Married, 1998- 1 son from prev relationship brother with MVA at 101, in rest home as of 2011 working at [employer] no tob  no alcohol  Past Medical History: IBS, constipation predominant hiatal hernia breast cysts h/o vit D def anxiety- See Dr. Nolen Mu with pysch Gyn- [facility] in [place]   Past Surgical History: Colonoscopy Polyp Int Hemms  (Dr Mechele Collin) 03/22/09 Hysterectomy h/o breast cyst excision, mult cyst aspirations  Family History: M dead cancer, breast F dead cancer, colon  Social History: Married, 1998- 1 son from prev relationship brother with MVA at 43, in rest home as of 2011 working at [employer] no tob  no alcohol  Review of Systems       See HPI.  Otherwise negative.    Physical Exam  General:  GEN: nad, alert and oriented NECK: supple w/o LA CV: rrr.   murmur noted PULM: ctab, no inc  wob ABD: soft, +bs EXT: no edema SKIN: no acute rash    Impression & Recommendations:  Problem # 1:  UNSPECIFIED VITAMIN D DEFICIENCY (ICD-268.9) I would continue otc vitamin D.  She has been taking this episodically.    Problem # 2:  ? of FIBROMYALGIA (ICD-729.1) I would start neurontin and follow up as needed.  This may help.   Problem # 3:  ANXIETY STATE, UNSPECIFIED (ICD-300.00) No change in meds.  Consider counseling.  She will think about this.  Her updated medication list for this problem includes:    Tofranil 10 Mg Tabs (Imipramine hcl) ..... 30mg  daily by mouth    Tranxene-t 7.5 Mg Tabs (Clorazepate dipotassium) .[name]... 1 tablet twice a day  Problem # 4:  HYPERLIPIDEMIA (ICD-272.4) i would work on diet and exercise in meantime.  I wouldn't start statin with her aches already a problem.  She agrees.   Complete Medication List: 1)  Tofranil 10 Mg Tabs (Imipramine hcl) .... 30mg  daily by mouth 2)  Tranxene-t 7.5 Mg Tabs (Clorazepate dipotassium) .[name].. 1 tablet twice a day 3)  Progesterone 1000 Mg/60gm Crea (Misc natural products) .... As needed hot flashes 4)  Vitamin D3 1000 Unit Caps (Cholecalciferol) .[name].. 1 by mouth once daily 5)  Neurontin 100 Mg Caps (Gabapentin) .Marland KitchenMarland KitchenMarland Kitchen  1 by mouth as directed by dr. Nolen Mu  Patient Instructions: 1)  I would start on the neurontin as directed and see if that helps.  I would consider counseling here with Dr. Laymond Purser.   2)  We can recheck your lipids in 6 months.  Call back to set that up. 3)  Take the vitamin D daily. 4)  Take care; glad to see you today.    Orders Added: 1)  Est. Patient Level IV [30865]   Immunization History:  Influenza Immunization History:    Influenza:  historical (09/08/2010)   Immunization History:  Influenza Immunization History:    Influenza:  Historical (09/08/2010)   Appended Document: GET ESTABLISHED, REVIEW LABS/ 30 MINS    Clinical Lists Changes  Medications: Added new medication of  PRILOSEC OTC 20 MG TBEC (OMEPRAZOLE MAGNESIUM) 1 by mouth two times a day Observations: Added new observation of MEDRECON: current updated (10/06/2010 17:09)        Current Medications (verified): 1)  Tofranil 10 Mg Tabs (Imipramine Hcl) .... 30mg  Daily By Mouth 2)  Tranxene-T 7.5 Mg  Tabs (Clorazepate Dipotassium) .[name].. 1 Tablet Twice A Day 3)  Progesterone 1000 Mg/60gm Crea (Misc Natural Products) .... As Needed Hot Flashes 4)  Vitamin D3 1000 Unit Caps (Cholecalciferol) .[name].. 1 By Mouth Once Daily 5)  Neurontin 100 Mg Caps (Gabapentin) .[name].. 1 By Mouth As Directed By Dr. Nolen Mu 6)  Prilosec Otc 20 Mg Tbec (Omeprazole Magnesium) .[name].. 1 By Mouth Two Times A Day  Allergies: 1)  ! Penicillin 2)  ! * Emycin 3)  ! Sulfa 4)  ! Celebrex

## 2010-12-28 NOTE — Progress Notes (Signed)
Summary: Mail order RX for BP medication and Lab work RX requested  Phone Note Call from Patient Call back at Work Phone [phone] Call back at [phone]   Caller: Patient Summary of Call: Pt requests a RX for her BP medication. Mail order so it needs to be written for a 90 day supply w/3 refills. Also she needs an RX for bloodwork so she can get her labs drawn for her upcoming CPX. You can reach her at the above numbers to let her know these things are done and ready for her to pick up. Initial call taken by: Mickle Asper,  August 17, 2008 11:31 AM  Follow-up for Phone Call        Script for med and labs both written. Follow-up by: Shaune Leeks MD,  August 17, 2008 2:57 PM  Additional Follow-up for Phone Call Additional follow up Details #1::        PATIENT NOTIFIED TO PICK UP PRESCRIBTIONS. Additional Follow-up by: Providence Crosby,  August 17, 2008 3:25 PM      Prescriptions: PROPRANOLOL HCL 10 MG  TABS (PROPRANOLOL HCL) one tab by mouth qd  #90 x 3   Entered and Authorized by:   Shaune Leeks MD   Signed by:   Shaune Leeks MD on 08/17/2008   Method used:   Print then Give to Patient   RxID:   [id number]

## 2010-12-28 NOTE — Assessment & Plan Note (Signed)
Summary: CONGESTION,ST,SWOLLEN GLANDS/CLE   Vital Signs:  Patient profile:   55 year old female Weight:      146 pounds Temp:     98.5 degrees F oral Pulse rate:   76 / minute Pulse rhythm:   regular BP sitting:   130 / 80  (left arm) Cuff size:   regular  Vitals Entered By: Providence Crosby (February 15, 2009 11:36 AM)  History of Present Illness: Has been sick since last Tue nite. She has felt feverish but no fever by thermometer. She has used Tussionex but has gagging at night and has to get up. Showering tires her. She has laid around all weekend. Her brother is at a NH that has been closed to visitors for Norvovirus. She had bad pelvic pain one month ago and couldn't get in here or to see Dr Lonna Cobb, saw Dr Wanda Plump who was very noncaring  and was given Macrobid and Flomax...Marland Kitchenfell after being on commode during middle of the night and had lots of abrasions and contusions with bloody lip. Macrobiud also worsened her indigestion. Her urine culture showed resistance to Macrobid and was put on Cipro. She was scoped by Dr Leonette Monarch and things looked ok. Her neck still bothers her from the fall.   Allergies: 1)  ! Penicillin 2)  ! * Emycin 3)  ! Sulfa 4)  ! Celebrex 5)  ! * ? Cipro  Physical Exam  General:  Well-developed,well-nourished,in no acute distress; alert,appropriate and cooperative throughout examination Head:  Normocephalic and atraumatic without obvious abnormalities. No apparent alopecia or balding.Sinuses nonternder. Eyes:  Conjunctiva clear Ears:  External ear exam shows no significant lesions or deformities.  Otoscopic examination reveals clear canals, tympanic membranes are intact bilaterally without bulging, retraction, inflammation or discharge. Hearing is grossly normal bilaterally. Cerumen bilat. Nose:  External nasal examination shows no deformity or inflammation. Nasal mucosa are pink and moist without lesions or exudates. Mouth:  Oral mucosa and oropharynx without  lesions or exudates.  Teeth in good repair. Mild clear but thick PND. Neck:  No deformities, masses, or tenderness noted. Chest Wall:  No deformities, masses, or tenderness noted. Lungs:  Normal respiratory effort, chest expands symmetrically. Lungs are clear to auscultation, no crackles or wheezes. Heart:  Normal rate and regular rhythm. S1 and S2 normal without gallop, murmur, click, rub or other extra sounds.   Impression & Recommendations:  Problem # 1:  URI (ICD-465.9) Assessment New See instructions.  Complete Medication List: 1)  Tofranil 10 Mg Tabs (Imipramine hcl) .... 30mg  daily by mouth 2)  Tranxene-t 7.5 Mg Tabs (Clorazepate dipotassium) .[name].. 1 tablet twice a day 3)  Propranolol Hcl 10 Mg Tabs (Propranolol hcl) .... One tab by mouth daily 4)  Progesterone 1000 Mg/60gm Crea (Misc natural products) .... As needed hot flashes 5)  Calcium 1000mg / Magnesium 500mg  and Vitamin D 400mg   .... Takes 3 tablet daily 6)  Vitamin D 1000 Unit Tabs (Cholecalciferol) .[name].. 1 tablet twice a day by mouth 7)  Cipro 500 Mg Tabs (Ciprofloxacin hcl) .... Just finished a round of abx  Patient Instructions: 1)  Take Guaifenesin by [facility] or [facility] and getting MUCOUS RELIEF EXPECTORANT (400mg ), take 11/2 tabs by mouth AM and NOON. 2)  Drink lots of fluids anytime taking Guaifenesin.  3)  Tyl Es (500mg ) 2 tabs by mouth three times a day  4)  Lozenges 5)  Vicks at night. 6)  Call if sxs don't improve. 7)  The medication  list was reviewed and reconciled.  All changed / newly prescribed medications were explained.  A complete medication list was provided to the patient / caregiver.

## 2010-12-28 NOTE — Letter (Signed)
Summary: [facility] / LEFT BREAST CYST / DR. JEFFREY BYRNETT  [facility] / LEFT BREAST CYST / DR. JEFFREY BYRNETT   Imported By: Carin Primrose 07/01/2009 16:25:02  _____________________________________________________________________  External Attachment:    Type:   Image     Comment:   External Document

## 2010-12-28 NOTE — Consult Note (Signed)
Summary: [facility]Dr. Servando Snare  [facility]Dr. Servando Snare   Imported By: Eleonore Chiquito 02/11/2008 08:59:27  _____________________________________________________________________  External Attachment:    Type:   Image     Comment:   External Document  Appended Document: [facility]Dr. Servando Snare    Clinical Lists Changes  Problems: Added new problem of IRRITABLE BOWEL SYNDROME (ICD-564.1)

## 2010-12-28 NOTE — Progress Notes (Signed)
Summary: sinus congestion  Phone Note Call from Patient Call back at Home Phone [phone]   Caller: Patient Call For: Shaune Leeks MD Summary of Call: Pt called to report that she now has sinus congestion.  She was seen on monday for chest congestion.  She has been taking guaifenesin but she is asking if that will help with sinus congestion.  [facility]ses [facility] [address] Initial call taken by: Lowella Petties,  February 17, 2009 10:19 AM  Follow-up for Phone Call        The guaif should help, cont all. Drink lots of fluids. This is typical for what is going around and is lasting a long time. Follow-up by: Shaune Leeks MD,  February 17, 2009 11:13 AM  Additional Follow-up for Phone Call Additional follow up Details #1::        Advised pt. Additional Follow-up by: Lowella Petties,  February 17, 2009 11:27 AM

## 2010-12-28 NOTE — Assessment & Plan Note (Signed)
Summary: SWOLLEN GLANDS/CLE   Vital Signs:  Patient profile:   55 year old female Weight:      148 pounds Temp:     98.1 degrees F oral Pulse rate:   88 / minute Pulse rhythm:   regular BP sitting:   138 / 78  (left arm) Cuff size:   regular  Vitals Entered By: Sydell Axon LPN (October 11, 2009 4:00 PM) CC: ? Sinus infection, head stopped up, feels tired and cough   History of Present Illness: Pt here because she is sick fo last 8 days. Husband had same thing three weeks ago. He rglands are swollen, she is seeing some blood. She was out of work Friday on the couch. She has had no fever taking 2 Tyl once a day. She has had headaches. She gets up thick green and blood. She has had to do some lifting things at work. No ear pain but they are popping. SAhe has some rhinitis with green and blood also, mostly in the mornings. She took a cough drop last night and it got all over her face and pillowcase. ST started and is now ok. She has taken Mucinex.   Problems Prior to Update: 1)  Elevated Blood Pressure Without Diagnosis of Hypertension  (ICD-796.2) 2)  Abdominal Pain, Epigastric  (ICD-789.06) 3)  Breast Cysts, Bilateral  (ICD-610.1) 4)  Palpitations, Chronic  (ICD-785.1) 5)  Hyperlipidemia  (ICD-272.4) 6)  Unspecified Vitamin D Deficiency  (ICD-268.9) 7)  Irritable Bowel Syndrome  (ICD-564.1) 8)  Dizziness  (ICD-780.4) 9)  Fatigue  (ICD-780.79)  Medications Prior to Update: 1)  Tofranil 10 Mg Tabs (Imipramine Hcl) .... 30mg  Daily By Mouth 2)  Tranxene-T 7.5 Mg  Tabs (Clorazepate Dipotassium) .[name].. 1 Tablet Twice A Day 3)  Progesterone 1000 Mg/60gm Crea (Misc Natural Products) .... As Needed Hot Flashes 4)  Calcium 1000mg / Magnesium 500mg  and Vitamin D 400mg  .... Takes 3 Tablet Daily 5)  Vitamin D 1000 Unit Tabs (Cholecalciferol) .[name].. 1 Tablet Twice A Day By Mouth 6)  Cipro 500 Mg Tabs (Ciprofloxacin Hcl) .... Just Finished A Round of Abx  Allergies: 1)  ! Penicillin  2)  ! * Emycin 3)  ! Sulfa 4)  ! Celebrex  Physical Exam  General:  Well-developed,well-nourished,in no acute distress; alert,appropriate and cooperative throughout examination, nontoxic. Head:  Normocephalic and atraumatic without obvious abnormalities. No apparent alopecia or balding. Sinuses nontender. Eyes:  Conjunctiva clear Ears:  External ear exam shows no significant lesions or deformities.  Otoscopic examination reveals clear canals, tympanic membranes are intact bilaterally without bulging, retraction, inflammation or discharge. Hearing is grossly normal bilaterally. Cerumen bilat. Nose:  External nasal examination shows no deformity or inflammation. Nasal mucosa are pink and moist without lesions but mild thick exudates. Mouth:  Oral mucosa and oropharynx without lesions or exudates.  Teeth in good repair. Mild clear but thick PND. Neck:  No deformities, masses, or tenderness noted. Lungs:  Normal respiratory effort, chest expands symmetrically. Lungs are clear to auscultation, no crackles or wheezes. Heart:  Normal rate and regular rhythm. S1 and S2 normal without gallop, murmur, click, rub or other extra sounds. Cervical Nodes:  Mild ant cervical swelling superiorally bilat R>L, shoddy and mobile.   Impression & Recommendations:  Problem # 1:  URI (ICD-465.9)  See instructions. The following medications were removed from the medication list:    Mucinex 600 Mg Xr12h-tab (Guaifenesin) .[name]... As needed  Instructed on symptomatic treatment. Call if symptoms persist or worsen.  Complete Medication List: 1)  Tofranil 10 Mg Tabs (Imipramine hcl) .... 30mg  daily by mouth 2)  Tranxene-t 7.5 Mg Tabs (Clorazepate dipotassium) .[name].. 1 tablet twice a day 3)  Progesterone 1000 Mg/60gm Crea (Misc natural products) .... As needed hot flashes 4)  Calcium 1000mg / Magnesium 500mg  and Vitamin D 400mg   .... Takes 3 tablet daily  5)  Vitamin D 1000 Unit Tabs (Cholecalciferol) .[name].. 1 tablet twice a day by mouth 6)  Doxycycline Hyclate 100 Mg Caps (Doxycycline hyclate) .... One tab by mouth two times a day  Patient Instructions: 1)  Take Guaifenesin by [facility] or [facility] and getting MUCOUS RELIEF EXPECTORANT (400mg ), take 11/2 tabs by mouth AM and NOON. 2)  Drink lots of fluids anytime taking Guaifenesin.  3)  Take Tyl Arthritis 2 two times a day  4)  Gargle frequently with warm salt water. 5)  Keep a lozenge in your mouth. 6)  Take Doxy if no improvement. Prescriptions: DOXYCYCLINE HYCLATE 100 MG CAPS (DOXYCYCLINE HYCLATE) one tab by mouth two times a day  #20 x 0   Entered and Authorized by:   Shaune Leeks MD   Signed by:   Shaune Leeks MD on 10/11/2009   Method used:   Print then Give to Patient   RxID:   [id number]   Current Allergies (reviewed today): ! PENICILLIN ! [place] ! SULFA ! CELEBREX

## 2010-12-28 NOTE — Letter (Signed)
Summary: [facility]/Dr. Wanda Plump  [facility]/Dr. Wanda Plump   Imported By: Eleonore Chiquito 01/05/2009 12:05:18  _____________________________________________________________________  External Attachment:    Type:   Image     Comment:   External Document

## 2010-12-28 NOTE — Letter (Signed)
Summary: Nadara Eaton letter  [facility] at [facility]  [address] [place], [place] [address]   Phone: [phone]  Fax: [phone]       07/04/2010 MRN: [id number]  [facility] [address] [place], [place]  [address]  Dear Ms. [facility] [place], and [place] announce the retirement of Arta Silence, M.D., from full-time practice at the [facility] office effective May 26, 2010 and his plans of returning part-time.  It is important to Dr. Hetty Ely and to our practice that you understand that [facility] has seven physicians in our office for your health care needs.  We will continue to offer the same exceptional care that you have today.    Dr. Hetty Ely has spoken to many of you about his plans for retirement and returning part-time in the fall.   We will continue to work with you through the transition to schedule appointments for you in the office and meet the high standards that [place] is committed to.   Again, it is with great pleasure that we share the news that Dr. Hetty Ely will return to [facility] at [facility] in October of 2011 with a reduced schedule.    If you have any questions, or would like to request an appointment with one of our physicians, please call us at [phone] and press the option for Scheduling an appointment.  We take pleasure in providing you with excellent patient care and look forward to seeing you at your next office visit.  Our [facility] Physicians are:  Tillman Abide, M.D. Laurita Quint, M.D. Roxy Manns, M.D. Kerby Nora, M.D. Hannah Beat, M.D. Ruthe Mannan, M.D. We proudly welcomed Raechel Ache, M.D. and Eustaquio Boyden, M.D. to the practice in July/August 2011.  Sincerely,  [facility]

## 2010-12-28 NOTE — Assessment & Plan Note (Signed)
Summary: NOT FEELING WELL/CLE   Vital Signs:  Patient Profile:   55 Years Old Female Weight:      146 pounds Temp:     98.2 degrees F oral Pulse rate:   80 / minute Pulse rhythm:   regular BP sitting:   120 / 80  (left arm) Cuff size:   regular  Vitals Entered By: Providence Crosby (December 03, 2008 3:51 PM)                 Chief Complaint:  not feeling well.  History of Present Illness: Pt, w/ past hx arrythmia, IBS, family hx Colon cancer, VitD deficiency, fatigue presents to discuss BP meds. She has been taking 1/2 pill Propranolol every few days instead of daily, bc she feels they make her very cold. Her fingers often turn purple when she is cold. If she takes a whole pill, her BP drops too low. BP today is 120/80, usually is 120/70. She asks if skipping days will hurt her.  Her heart still skips beats, especially with caffeine and stress.   In addition she has been feeling tired. She currently has a lot of stress- job, brother in rest home (she is his power of attorney), not enough sleep.   She still has IBS problems- constipation lately, cannot pass stool w/ medication. Scheduled for colonoscopy Worried about cysts in breasts, has had normal mammograms. want to be sent to [facility]- may have better diagnostics.   Vitamin D levels now normal, was previously low.      Prior Medications Reviewed Using: Patient Recall  Current Allergies (reviewed today): ! PENICILLIN ! [facility] ! SULFA ! CELEBREX ! * ? CIPRO      Physical Exam  General:     Well-developed,well-nourished,in no acute distress; alert,appropriate and cooperative throughout examination Head:     Normocephalic and atraumatic without obvious abnormalities. No apparent alopecia or balding. Eyes:     Conjunctiva clear Ears:      External ear exam shows no significant lesions or deformities.  Otoscopic examination reveals clear canals, tympanic membranes are intact bilaterally without bulging, retraction, inflammation or discharge. Hearing is grossly normal bilaterally. Nose:     External nasal examination shows no deformity or inflammation. Nasal mucosa are pink and moist without lesions or exudates. Mouth:     Oral mucosa and oropharynx without lesions or exudates.  Teeth in good repair. Lungs:     Normal respiratory effort, chest expands symmetrically. Lungs are clear to auscultation, no crackles or wheezes. Heart:     Normal rate and regular rhythm. S1 and S2 normal without gallop, murmur, click, rub or other extra sounds. Abdomen:     Bowel sounds positive,abdomen soft and non-tender without masses, organomegaly or hernias noted. Minimal global discomfort with nml active BS. Psych:     Cognition and judgment appear intact. Alert and cooperative with normal attention span and concentration. No apparent delusions, illusions, hallucinations. Normal baseline anxiety.    Impression & Recommendations:  Problem # 1:  PALPITATIONS, CHRONIC (ICD-785.1) Assessment: Unchanged Stable on current dose of Inderal...may continue 1/2 every other day. Her updated medication list for this problem includes:    Propranolol Hcl 10 Mg Tabs (Propranolol hcl) ..... One tab by mouth qd Avoid caffeine, chocolate, and stimulants. Stress reduction as well as medication options discussed.   Problem # 2:  FATIGUE (ICD-780.79) Assessment: Unchanged Baseline. Reassured.  Problem # 3:  IRRITABLE BOWEL SYNDROME (ICD-564.1) Assessment: Unchanged Anxiety worsens, discussed BMs and need  for laxative. Try to avoid.  Problem # 4:  BREAST CYSTS, BILATERAL (ICD-610.1) Assessment: Unchanged Reassured [place] adequate for following.  Problem # 5:  VITAMIN D  Nml, start Vitamin D 1000IU two times a day for three months. Check via lab and see me back.  Complete Medication List: 1)  Tofranil 10 Mg Tabs (Imipramine hcl) .... 30mg  daily by mouth 2)  Tranxene-t 7.5 Mg Tabs (Clorazepate dipotassium) .[name].. 1 bid 3)  Propranolol Hcl 10 Mg Tabs (Propranolol hcl) .... One tab by mouth qd 4)  Progesterone 1000 Mg/60gm Crea (Misc natural products) .... As needed hot flashes 5)  Calcium 1000mg / Magnesium 500mg  and Vitamin D 400mg   .... Takes 3 tablet daily 6)  Vitamin D 45409 Unit Caps (Ergocalciferol) .... One tab by mouth once a week for four weeks, then one every other week for four weeks.   Patient Instructions: 1)  Take Vit D 1000IU two times a day until seen. 2)  RTC 3 months and get Vit D lvl prior.   ]

## 2010-12-28 NOTE — Progress Notes (Signed)
Summary: needs order for labs   Phone Note Call from Patient Call back at Home Phone [phone]   Caller: Patient Call For: Shaune Leeks MD Summary of Call: Patient sates that every year around this time Dr. Hetty Ely would give her an order to have her labs done at [facility]  prior to her her yearly exam. She is asking if she could have this mailed to her home.  Initial call taken by: Melody Comas,  July 29, 2010 1:46 PM  Follow-up for Phone Call        hand written fx for vit D 268.9 and cmet/lipid 272.0.  please mail to patient.  Follow-up by: Crawford Givens MD,  July 29, 2010 2:04 PM  Additional Follow-up for Phone Call Additional follow up Details #1::        Order mailed  Additional Follow-up by: Lowella Petties CMA,  July 29, 2010 2:52 PM

## 2010-12-28 NOTE — Procedures (Signed)
Summary: [facility]/Colonoscopy/Dr. Mechele Collin  [facility]/Colonoscopy/Dr. Mechele Collin   Imported By: Eleonore Chiquito 03/26/2009 09:42:10  _____________________________________________________________________  External Attachment:    Type:   Image     Comment:   External Document  Appended Document: [facility]/Colonoscopy/Dr. Mechele Collin    Clinical Lists Changes  Observations: Added new observation of PAST SURG HX: Colonoscopy [facility]  (Dr Mechele Collin) 03/22/09 (03/28/2009 20:41)       Past Surgical History:    Colonoscopy [facility]  (Dr Mechele Collin) 03/22/09

## 2010-12-28 NOTE — Miscellaneous (Signed)
Summary: [facility] Values  Clinical Lists Changes  Observations: Added new observation of VIT D 1,25OH: 29.6  (09/30/2010 10:30) Added new observation of PROTEIN, TOT: 7.2 g/dL (16/08/9603 54:09) Added new observation of SGPT (ALT): 13 units/L (09/30/2010 10:30) Added new observation of SGOT (AST): 17 units/L (09/30/2010 10:30) Added new observation of CREATININE: 0.84 mg/dL (81/19/1478 29:56) Added new observation of BUN: 12 mg/dL (21/30/8657 84:69) Added new observation of BG RANDOM: 91 mg/dL (62/95/2841 32:44) Added new observation of K SERUM: 3.8 meq/L (09/30/2010 10:30) Added new observation of NA: 141 meq/L (09/30/2010 10:30) Added new observation of LDL: 156 mg/dL (11/29/7251 66:44) Added new observation of HDL: 55 mg/dL (03/47/4259 56:38) Added new observation of TRIGLYC TOT: 90 mg/dL (75/64/3329 51:88) Added new observation of CHOLESTEROL: 229 mg/dL (41/66/0630 16:01)

## 2010-12-28 NOTE — Consult Note (Signed)
Summary: [facility]  Dr Lemar Livings [facility]   Imported By: Darral Dash 11/27/2007 14:54:11  _____________________________________________________________________  External Attachment:    Type:   Image     Comment:   External Document

## 2010-12-28 NOTE — Letter (Signed)
Summary: Dr.Jeffrey Byrnett Note  Dr.Jeffrey Byrnett Note   Imported By: Beau Fanny 07/06/2009 14:55:16  _____________________________________________________________________  External Attachment:    Type:   Image     Comment:   External Document

## 2010-12-28 NOTE — Letter (Signed)
Summary: [facility]/Dr. Lemar Livings   Imported By: Eleonore Chiquito 06/15/2009 09:44:21  _____________________________________________________________________  External Attachment:    Type:   Image     Comment:   External Document

## 2010-12-29 NOTE — Letter (Signed)
Summary: [facility]  [facility]   Imported By: Maryln Gottron 12/01/2010 10:58:16  _____________________________________________________________________  External Attachment:    Type:   Image     Comment:   External Document  Appended Document: [facility]    Clinical Lists Changes  Observations: Added new observation of PAST MED HX: IBS, constipation predominant hiatal hernia breast cysts per Dr. Lemar Livings h/o vit D def anxiety- See Dr. Nolen Mu with pysch Gyn- [facility] in [place] [facility]  (12/01/2010 13:55)       Past History:  Past Medical History: IBS, constipation predominant hiatal hernia breast cysts per Dr. Lemar Livings h/o vit D def anxiety- See Dr. Nolen Mu with pysch Gyn- [facility] in [place] [facility]

## 2011-01-23 ENCOUNTER — Ambulatory Visit: Payer: Self-pay | Admitting: Family Medicine

## 2011-02-02 ENCOUNTER — Ambulatory Visit (INDEPENDENT_AMBULATORY_CARE_PROVIDER_SITE_OTHER): Payer: 59 | Admitting: Family Medicine

## 2011-02-02 ENCOUNTER — Encounter: Payer: Self-pay | Admitting: Family Medicine

## 2011-02-02 DIAGNOSIS — J069 Acute upper respiratory infection, unspecified: Secondary | ICD-10-CM | POA: Insufficient documentation

## 2011-02-07 NOTE — Assessment & Plan Note (Signed)
Summary: Congestion, ears   Vital Signs:  Patient profile:   55 year old female Height:      67 inches Weight:      148 pounds BMI:     23.26 Temp:     98.2 degrees F oral Pulse rate:   72 / minute Pulse rhythm:   regular BP sitting:   134 / 80  (left arm) Cuff size:   regular  Vitals Entered By: Delilah Shan CMA Monetta Lick Dull) (February 02, 2011 3:45 PM) CC: Congestion, ears.   History of Present Illness: Mult sick contacts.  R ear feels stopped up.  Nasal passages feel dry.  Clear post nasal gtt. Sx started  ~1 week ago. Took phenylephine.  No other meds new meds.  No fevers.  Some aches.  Minimal cough, no sputum. Fatigued.  ST prev, but resolved now.  Had a flu shot in the fall.    Allergies: 1)  ! Penicillin 2)  ! * Emycin 3)  ! Sulfa 4)  ! Celebrex  Social History: Married, 1998- 1 son from prev relationship brother with MVA at 47, in rest home as of 2011 working at [employer] no tob  no alcohol  Review of Systems       See HPI.  Otherwise negative.    Physical Exam  General:  GEN: nad, alert and oriented HEENT: mucous membranes moist, TM w/o erythema but some cerumen bilaterally, nasal epithelium injected, OP with cobblestoning NECK: supple w/o LA CV: rrr. PULM: ctab, no inc wob EXT: no edema  R TM mobile with valsalva   Impression & Recommendations:  Problem # 1:  URI (ICD-465.9) Likely viral with mild R ETD.  Able to move TM with valsalva today.  I would use flonase (she may already have some at home), rest and drink plenty of fluids.  follow up as needed.  sinuses not tender to palpation.  nontoxic, follow up as needed.  she agrees.  Complete Medication List: 1)  Tofranil 10 Mg Tabs (Imipramine hcl) .... 30mg  daily by mouth 2)  Tranxene-t 7.5 Mg Tabs (Clorazepate dipotassium) .[name].. 1 tablet twice a day 3)  Progesterone 1000 Mg/60gm Crea (Misc natural products) .... As needed hot flashes 4)  Neurontin 100 Mg Caps (Gabapentin) .[name].. 1 by mouth as directed by dr.  Nolen Mu as needed 5)  Prilosec Otc 20 Mg Tbec (Omeprazole magnesium) .[name].. 1 by mouth two times a day 6)  Flonase 50 Mcg/act Susp (Fluticasone propionate) .... 2 sprays per nostril per day  Patient Instructions: 1)  I would use the flonase, 2 sprays in each nostril.  Mucinex may help some.  Drink plenty of fluids and get some rest.  Take care.  This should gradually resolve.  Prescriptions: FLONASE 50 MCG/ACT SUSP (FLUTICASONE PROPIONATE) 2 sprays per nostril per day  #1 x 2   Entered and Authorized by:   Crawford Givens MD   Signed by:   Crawford Givens MD on 02/02/2011   Method used:   Print then Give to Patient   RxID:   [id number]    Orders Added: 1)  Est. Patient Level III [14782]    Current Allergies (reviewed today): ! PENICILLIN ! [facility] ! SULFA ! CELEBREX

## 2011-05-18 ENCOUNTER — Encounter: Payer: Self-pay | Admitting: Family Medicine

## 2011-05-19 ENCOUNTER — Ambulatory Visit (INDEPENDENT_AMBULATORY_CARE_PROVIDER_SITE_OTHER): Payer: 59 | Admitting: Family Medicine

## 2011-05-19 ENCOUNTER — Encounter: Payer: Self-pay | Admitting: Family Medicine

## 2011-05-19 DIAGNOSIS — R002 Palpitations: Secondary | ICD-10-CM

## 2011-05-19 DIAGNOSIS — R5383 Other fatigue: Secondary | ICD-10-CM

## 2011-05-19 DIAGNOSIS — R5381 Other malaise: Secondary | ICD-10-CM

## 2011-05-19 NOTE — Progress Notes (Signed)
FIbromyalgia.  Changes at work noted and she continues to care for brother, who is ill. All has been stressful for her.  Continues with gradual increase in weight.  Sleeping ~5-6 hours a night.  Not exercising.  She continues with Debra Little.  Recently with death in family- a close cousin.  Continues with aches, frequent, even her skin is sore to light touch. She hadn't looked into counseling otherwise.  Couldn't tolerate neurontin.   H/o known MVP and occ palpitations.  No fainting.  We talked about referral today.  See plan.  Meds, vitals, and allergies reviewed.   ROS: See HPI.  Otherwise, noncontributory.  nad but flat affect  Mmm Neck with TMG rrr ctab Ext w/o edema

## 2011-05-19 NOTE — Patient Instructions (Signed)
See Debra Little about your referral before your leave today. I would get your labs done and then talk with [facility]. We'll contact you with your lab report.  Take care.

## 2011-05-19 NOTE — Assessment & Plan Note (Addendum)
Likely related to fibromyalgia and social upheaval.  Check cbc/tsh/b12 at [facility].  Written rx given.  She'll get that done and then check with Nolen Mu about tx for fibromyalgia.

## 2011-05-19 NOTE — Assessment & Plan Note (Addendum)
Refer to cards.  She was regular on exam today and had no symptoms.  She may benefit from ambulatory monitoring.  I would like cards input.

## 2011-05-21 ENCOUNTER — Encounter: Payer: Self-pay | Admitting: Family Medicine

## 2011-05-29 ENCOUNTER — Encounter: Payer: Self-pay | Admitting: Family Medicine

## 2011-06-04 ENCOUNTER — Encounter: Payer: Self-pay | Admitting: Family Medicine

## 2011-06-05 ENCOUNTER — Telehealth: Payer: Self-pay | Admitting: *Deleted

## 2011-06-05 NOTE — Telephone Encounter (Signed)
Patient called stating that she was on Prilosec for acid reflux and GERD. Patient stated that you are aware of the problems that she had with this medication and had to stop it. Patient is requesting that you [facility] in [facility] [address] [place].

## 2011-06-05 NOTE — Telephone Encounter (Signed)
I called and LMOVM for patient. Please call and get more details on this (esp since nexium is in regular omeprazole).  Thanks.

## 2011-06-06 NOTE — Telephone Encounter (Signed)
Noted  

## 2011-06-06 NOTE — Telephone Encounter (Signed)
Spoke with patient. She said the Prilosec seems to make her "heart skip" when she takes it. She says it doesn't do it when she doesn't take the medication. I told her I would let you know and see if there was something else you wanted to try with her. She said you were aware she has an upcoming cardiology appt.   Verbal per Dr. Trudi Ida patient try OTC Prevacid since Nexium could cause the same symptoms as the prilosec. Patient notified and will call with an update.

## 2011-07-23 ENCOUNTER — Encounter: Payer: Self-pay | Admitting: Family Medicine

## 2011-09-15 ENCOUNTER — Encounter: Payer: Self-pay | Admitting: *Deleted

## 2011-09-15 ENCOUNTER — Telehealth: Payer: Self-pay | Admitting: *Deleted

## 2011-09-15 NOTE — Telephone Encounter (Signed)
Pt says that it's time for her yearly labs.  She works at [employer] and would like an order mailed to her home address.

## 2011-09-15 NOTE — Telephone Encounter (Signed)
Please print letter with fasting CMET/lipid-272.0 and vitamin D (25 hydroxy)-268.9.  Please send to pt.  Thanks.

## 2011-09-15 NOTE — Telephone Encounter (Signed)
Order written and mailed.

## 2011-10-16 ENCOUNTER — Encounter: Payer: Self-pay | Admitting: Family Medicine

## 2011-10-16 ENCOUNTER — Ambulatory Visit (INDEPENDENT_AMBULATORY_CARE_PROVIDER_SITE_OTHER): Payer: 59 | Admitting: Family Medicine

## 2011-10-16 VITALS — BP 140/76 | HR 88 | Temp 98.4°F | Wt 150.1 lb

## 2011-10-16 DIAGNOSIS — J029 Acute pharyngitis, unspecified: Secondary | ICD-10-CM

## 2011-10-16 DIAGNOSIS — J069 Acute upper respiratory infection, unspecified: Secondary | ICD-10-CM

## 2011-10-16 LAB — POCT RAPID STREP A (OFFICE): Rapid Strep A Screen: NEGATIVE

## 2011-10-16 MED ORDER — BENZONATATE 200 MG PO CAPS: 200.0000 mg | ORAL_CAPSULE | Freq: Three times a day (TID) | ORAL | Status: AC | PRN

## 2011-10-16 NOTE — Progress Notes (Signed)
Had flu shot.  Started feeling bad last week, on the 14th.  Started with ST.  Tried OTC meds, no help with mucinex DM and tussin DM.  Was off from work 10/13/11.  She rested that day and the weekend.  Still with tickle in throat and fatigue.  Drinking plenty of fluids.  Still feels hot.  When supine the tickle in the throat is worse.  Some nausea, no vomiting. Having to clear throat frequently.  Using throat spray and cough drops.  + ear congestion.  Throat feels irritated.    Meds, vitals, and allergies reviewed.   ROS: See HPI.  Otherwise, noncontributory.  GEN: nad, alert and oriented HEENT: mucous membranes moist, canals with cerumen B, nasal exam w/ mild erythema, clear discharge noted,  OP with cobblestoning and erythema but no exudates, frontal and max sinuses not ttp NECK: supple w/o LA CV: rrr.   PULM: ctab, no inc wob, dry cough noted EXT: no edema SKIN: no acute rash

## 2011-10-16 NOTE — Assessment & Plan Note (Signed)
Likely viral, ddx d/w pt. URI prevalent in community.  Nontoxic, supportive tx and f/u prn.  Tessalon prn for cough.  She agrees. Not for work given.

## 2011-10-16 NOTE — Patient Instructions (Signed)
Drink plenty of fluids, take tylenol as needed, and gargle with warm salt water for your throat.  Use the tessalon for the cough.  Out work in the meantime.  This should gradually improve.  Take care.  Let us know if you have other concerns.

## 2011-10-17 ENCOUNTER — Telehealth: Payer: Self-pay | Admitting: *Deleted

## 2011-10-17 NOTE — Telephone Encounter (Signed)
I'll address the hard copy.  

## 2011-10-17 NOTE — Telephone Encounter (Signed)
Pt was seen yesterday and will need FMLA forms completed.  She will let us know what days she needs this completed for, thinks it will be for all this week except Thursday.  She doesn't think she will be able to go back at all this week.  She said you gave her a letter, but [facility] doesn't except letters.  She will drop these forms off when she gets them in the mail.

## 2011-10-18 ENCOUNTER — Telehealth: Payer: Self-pay | Admitting: Family Medicine

## 2011-10-18 NOTE — Telephone Encounter (Signed)
Patient is still not feeling better; burning in throat; and asked [facility]  Pt call back is [phone]

## 2011-10-18 NOTE — Telephone Encounter (Signed)
I do not see that a prescription was called in except for tessalon which, using three times a day, should last her 10 days. She was seen two days ago. If her throat is bothering her, gargling with warm salt water will help sxs more than anything else.

## 2011-10-18 NOTE — Telephone Encounter (Signed)
Patient advised.

## 2011-10-27 ENCOUNTER — Ambulatory Visit: Payer: 59 | Admitting: Family Medicine

## 2011-10-31 ENCOUNTER — Encounter: Payer: Self-pay | Admitting: Family Medicine

## 2011-11-01 ENCOUNTER — Telehealth: Payer: Self-pay | Admitting: *Deleted

## 2011-11-01 DIAGNOSIS — Z0289 Encounter for other administrative examinations: Secondary | ICD-10-CM

## 2011-11-01 NOTE — Telephone Encounter (Signed)
Today was the first day I had ability to address this, as I have been out of office with illness until today.  Form is done.  Please send.

## 2011-11-01 NOTE — Telephone Encounter (Signed)
LMOVM of husband's call back number.  Form faxed to number on form and placed in Cynthia's box for billing and scanning.

## 2011-11-01 NOTE — Telephone Encounter (Signed)
Patient's husband called to say that she dropped off some paperwork here last week.  If her employer does not receive it by 12 o'clock tonight, she will lose her job.

## 2011-11-09 ENCOUNTER — Ambulatory Visit (INDEPENDENT_AMBULATORY_CARE_PROVIDER_SITE_OTHER): Payer: 59 | Admitting: Family Medicine

## 2011-11-09 ENCOUNTER — Encounter: Payer: Self-pay | Admitting: Family Medicine

## 2011-11-09 DIAGNOSIS — Z0000 Encounter for general adult medical examination without abnormal findings: Secondary | ICD-10-CM

## 2011-11-09 DIAGNOSIS — E559 Vitamin D deficiency, unspecified: Secondary | ICD-10-CM

## 2011-11-09 MED ORDER — ERGOCALCIFEROL 1.25 MG (50000 UT) PO CAPS: 50000.0000 [IU] | ORAL_CAPSULE | ORAL | Status: DC

## 2011-11-09 NOTE — Progress Notes (Signed)
CPE- See plan.  Routine anticipatory guidance given to patient.  See health maintenance.  It's been rough for patient recently, caring for brother in rest home, son had to have surgery and her dog died.    PMH and SH reviewed  Meds, vitals, and allergies reviewed.   ROS: See HPI.  Otherwise negative.    GEN: nad, alert and oriented HEENT: mucous membranes moist NECK: supple w/o LA CV: rrr. PULM: ctab, no inc wob ABD: soft, +bs EXT: no edema SKIN: no acute rash

## 2011-11-09 NOTE — Patient Instructions (Signed)
Take the vitamin D, recheck level in 12 weeks.  I'll send a note to your other docs.  Take care.

## 2011-11-13 DIAGNOSIS — Z0000 Encounter for general adult medical examination without abnormal findings: Secondary | ICD-10-CM | POA: Insufficient documentation

## 2011-11-13 NOTE — Assessment & Plan Note (Addendum)
Mammogram and breast exam per Dr. Lemar Livings.   Pelvic and pap per [facility].   Colonoscopy 2010, f/u 2016 per Dr. Mechele Collin.   Pt to check on prev Td/Tdap date.  Flu vaccine already done.  Continues with Dr. Nolen Mu with counseling.   Low vit D, replete and recheck level.   Labs d/w pt.  With BP controlled, I wouldn't treat LDL.  D/w pt.

## 2011-11-14 ENCOUNTER — Ambulatory Visit: Payer: Self-pay | Admitting: General Surgery

## 2011-11-22 ENCOUNTER — Encounter: Payer: Self-pay | Admitting: Family Medicine

## 2011-12-20 ENCOUNTER — Encounter: Payer: Self-pay | Admitting: Family Medicine

## 2012-02-05 ENCOUNTER — Ambulatory Visit: Payer: 59 | Admitting: Family Medicine

## 2012-02-07 ENCOUNTER — Ambulatory Visit: Payer: 59 | Admitting: Family Medicine

## 2012-02-07 ENCOUNTER — Ambulatory Visit (INDEPENDENT_AMBULATORY_CARE_PROVIDER_SITE_OTHER): Payer: 59 | Admitting: Family Medicine

## 2012-02-07 ENCOUNTER — Encounter: Payer: Self-pay | Admitting: Family Medicine

## 2012-02-07 VITALS — BP 112/70 | HR 60 | Temp 98.4°F | Wt 152.2 lb

## 2012-02-07 DIAGNOSIS — R509 Fever, unspecified: Secondary | ICD-10-CM

## 2012-02-07 DIAGNOSIS — M545 Low back pain, unspecified: Secondary | ICD-10-CM

## 2012-02-07 LAB — POCT URINALYSIS DIPSTICK
Bilirubin, UA: NEGATIVE
Blood, UA: NEGATIVE
Glucose, UA: NEGATIVE
Ketones, UA: NEGATIVE
Nitrite, UA: NEGATIVE
Protein, UA: NEGATIVE
Spec Grav, UA: 1.015
Urobilinogen, UA: 0.2
pH, UA: 5

## 2012-02-07 NOTE — Progress Notes (Signed)
Diffuse aches starting about 5 days ago, worse than normal.  Taking tylenol and aleve.  It didn't feel like typical fibromyalgia pain.  She had a fever over the weekend.  She started taking some left over cipro from a urology rx.  She's taking 500mg  bid.  Today is the 3rd day of abx.  She had B back pain and R abd pain. HA. She was constipated but didn't have burning with urination.  She is better overall today, less aches and no fever.  She still feels weak, diffusely.  Today is the first day she was out of the house since the weekend. Was out of work 3/11, 3/12, and 3/13.    Meds, vitals, and allergies reviewed.   ROS: See HPI.  Otherwise, noncontributory.  nad ncat Mmm rrr ctab abd soft, normal BS No CVA pain  Ua with trace LE but normal o/w.  Ucx pending.

## 2012-02-07 NOTE — Patient Instructions (Signed)
I would take aleve and tylenol in the meantime.  Get some rest and drink plenty of fluids.  Stop the cipro after tonight.  We'll contact you with your lab report. I'll fill out your FMLA papers.  I think you don't think you had a kidney infection.  It may have been a virus.

## 2012-02-08 ENCOUNTER — Ambulatory Visit: Payer: 59 | Admitting: Family Medicine

## 2012-02-08 DIAGNOSIS — R509 Fever, unspecified: Secondary | ICD-10-CM | POA: Insufficient documentation

## 2012-02-08 NOTE — Assessment & Plan Note (Signed)
Likely not a UTI, but check ucx today.  Stop abx after today.  Likely was a URI with exacerbation of muscle pain.  Was likely contagious, so she'll need FMLA papers and she'll likely be out through the 15th.  F/u prn.  Supportive tx o/w.

## 2012-02-09 LAB — URINE CULTURE: Colony Count: 6000

## 2012-02-12 ENCOUNTER — Telehealth: Payer: Self-pay | Admitting: Family Medicine

## 2012-02-12 NOTE — Telephone Encounter (Signed)
Noted. Letter done.

## 2012-02-12 NOTE — Telephone Encounter (Signed)
Pt called, need to speak w/ CMA asap.Debra KitchenMarland KitchenPlease call her back regarding a note for work, and Rx's  Call back # [phone].  See Previous message.Debra Little

## 2012-02-12 NOTE — Telephone Encounter (Signed)
Patient called the office asking to speak with Dr. Para March. She stated that she came in last week for a sick appointment and in the appointment, Dr. Para March had told her not to go back to work until she felt better. She didn't go to work to today but when she called her work they told her that since she had missed 5 working days it was considered as short term disability.

## 2012-02-12 NOTE — Telephone Encounter (Signed)
Patient also says to tell you that she has an appt with Emerson Monte next Thursday.

## 2012-02-12 NOTE — Telephone Encounter (Signed)
Patient says she needs the work note to cover all of last week and today.  (March 11-15 and March 18).  She says she came in last week with flu-like symptoms and had a UTI and she was out today because she took the Neurontin for her fibromyalgia and couldn't go to work.

## 2012-02-12 NOTE — Telephone Encounter (Signed)
Patient advised and letter left at front desk for pick up.

## 2012-02-13 ENCOUNTER — Telehealth: Payer: Self-pay | Admitting: Family Medicine

## 2012-02-13 NOTE — Telephone Encounter (Signed)
If she needs to be seen, then try to get her in tomorrow.  If it is more reasonable to f/u with Nolen Mu, then I would do that Thursday.

## 2012-02-13 NOTE — Telephone Encounter (Signed)
Get more details and let me know.

## 2012-02-13 NOTE — Telephone Encounter (Signed)
Spoke to patient and was advised that she is taking Neurontin and it makes her drowsy and she can not work when she takes it. Patient states that she is real nervous and breaks out in sweat at times and feels like she does not want to be around people and was not able to go back to work today. Patient states that she has a lot of issues going on at home, outside of home and her job is real stressful. Patient states that she has an appointment with Emerson Monte this Thursday. Patient states that her employer has put her on short term disability since she did not go back to work yesterday.

## 2012-02-13 NOTE — Telephone Encounter (Signed)
Pt called, need to be seen today having some depression and other issues, Please advise..Debra Little

## 2012-02-13 NOTE — Telephone Encounter (Signed)
Patient was originally requesting an appt today (which wasn't available) and you had asked Rene Kocher to get more information.  Shall I make her an appt for tomorrow?

## 2012-02-13 NOTE — Telephone Encounter (Signed)
Noted.  I think f/u with Debra Little is reasonable.  What else do I need to address now?  I'll fill out her papers if needed when they arrive.

## 2012-02-13 NOTE — Telephone Encounter (Signed)
Patient states she will keep the appt with Emerson Monte on Thursday but would like to schedule OV with [employer] on Friday for short term disability paperwork to be filled out.  She does not have that paperwork now so she prefers to wait until Friday to have that in hand.

## 2012-02-16 ENCOUNTER — Encounter: Payer: Self-pay | Admitting: Family Medicine

## 2012-02-16 ENCOUNTER — Ambulatory Visit (INDEPENDENT_AMBULATORY_CARE_PROVIDER_SITE_OTHER): Payer: 59 | Admitting: Family Medicine

## 2012-02-16 VITALS — BP 122/72 | HR 81 | Temp 98.3°F | Wt 152.8 lb

## 2012-02-16 DIAGNOSIS — F329 Major depressive disorder, single episode, unspecified: Secondary | ICD-10-CM

## 2012-02-16 DIAGNOSIS — F32A Depression, unspecified: Secondary | ICD-10-CM

## 2012-02-16 DIAGNOSIS — R300 Dysuria: Secondary | ICD-10-CM | POA: Insufficient documentation

## 2012-02-16 DIAGNOSIS — F341 Dysthymic disorder: Secondary | ICD-10-CM

## 2012-02-16 DIAGNOSIS — F419 Anxiety disorder, unspecified: Secondary | ICD-10-CM

## 2012-02-16 MED ORDER — GABAPENTIN 100 MG PO CAPS: ORAL_CAPSULE | ORAL | Status: DC

## 2012-02-16 NOTE — Assessment & Plan Note (Signed)
Done with abx, recheck ucx.

## 2012-02-16 NOTE — Progress Notes (Signed)
She's nervous, sweaty, worried, tired.  Worst in the AM. Inc in job stress. Caring for brother.  She's seeing Dr. Nolen Mu and rec'd psychology f/u.  She would like to see Dr. Laymond Purser.  She got some relief from the neurontin at 100mg  but was very drowsy the next day.  She is going to try go cut back to 50mg  a day.  It does help with aches.  Her son was recently kicked out by her husband.  She's been more tearful, esp Monday AM.    She's on short term disability now.  Needs papers filled out to reflect her situation.    [employer] asked her to inc the tranxene to 3 a day.  We may need to consider other options (liquid gabapentin vs other med) if she can't get 50mg  gabapentin as is or if she doesn't get relief.  She is seeing Dr. Nolen Mu again in May.    Again with dysuria.  No fevers.  [facility]heck uc[facility] at [facility].    Meds, vitals, and allergies reviewed.   ROS: See HPI.  Otherwise, noncontributory.  nad but flat affect.  Normal judgement but speech is slow and soft No tremor rrr ctab abd benign No SI/HI

## 2012-02-16 NOTE — Patient Instructions (Signed)
Get your urine checked at [facility].  Try taking 50mg  of gabapentin. I'll work on Environmental manager.  Call with an update next week.

## 2012-02-19 ENCOUNTER — Encounter: Payer: Self-pay | Admitting: Family Medicine

## 2012-02-19 ENCOUNTER — Telehealth: Payer: Self-pay | Admitting: Family Medicine

## 2012-02-19 MED ORDER — TETRACYCLINE HCL 500 MG PO CAPS: 500.0000 mg | ORAL_CAPSULE | Freq: Two times a day (BID) | ORAL | Status: DC

## 2012-02-19 NOTE — Assessment & Plan Note (Signed)
Continue with tranxene, TCA and gabapentin.  She is sedated with 100mg  of gabapentin, so she'll try 50mg .  She is tearful, panicked, fatigued, anxious and depressed, here with a flat affect today.  She is reasonable for short term out of work with psychology/psychiatry f/u.  >25 min spent with face to face with patient, >50% counseling and/or coordinating care.

## 2012-02-19 NOTE — Telephone Encounter (Signed)
See rx below and notes on labs.

## 2012-02-20 ENCOUNTER — Telehealth: Payer: Self-pay

## 2012-02-20 MED ORDER — DOXYCYCLINE HYCLATE 100 MG PO TABS: 100.0000 mg | ORAL_TABLET | Freq: Two times a day (BID) | ORAL | Status: DC

## 2012-02-20 NOTE — Telephone Encounter (Signed)
[facility] and [facility] both says that Tetracycline has been on back order for over a year and they are not giving any release date.  They say it is no longer being made.

## 2012-02-20 NOTE — Telephone Encounter (Signed)
Patient advised.

## 2012-02-20 NOTE — Telephone Encounter (Signed)
Doxy wouldn't likely get a high enough concentration in urine.  I would check with other pharmacies about availability.

## 2012-02-20 NOTE — Telephone Encounter (Signed)
[name] with [facility] [address] left v/m that Tetracycline is on back order and requested Dr Para March to send another antibiotic; [name] suggested Doxycycline to [facility] [address].Please advise.

## 2012-02-20 NOTE — Telephone Encounter (Signed)
In that case, doxy is likely the best option, though she'll need to be on it longer.  Take doxy 100mg  po bid x10 days.  rx sent.  Please cancel the tetracycline rx.

## 2012-02-26 ENCOUNTER — Telehealth: Payer: Self-pay

## 2012-02-26 NOTE — Telephone Encounter (Signed)
If sx resolved, no repeat ucx needed.  Stop doxy.  Doxy added to intolerance list.

## 2012-02-26 NOTE — Telephone Encounter (Signed)
Pt said started Doxycycline on 02/21/12. Pt stopped med on 02/24/12 due to pt having hiatal hernia and after taking Doxycycline had mid chest pain that went to her back also with irregular heart beat. Pt said since stopped Doxycycline no more pain or heart skipping. Having no UTI symptoms now; no burning or pain when urinate and no frequency. Pt wanted Dr Para March to know how Doxycyline affected her and if Dr Para March wants pt to repeat urine culture pts husband Rex Chevere has appt with Dr Sharen Hones today at 2:30pm and would like written order given to pts husband because pt is a [employer]. Pt uses [facility] and can be reached at [phone].Please advise.

## 2012-02-26 NOTE — Telephone Encounter (Signed)
Patient advised.

## 2012-03-06 ENCOUNTER — Ambulatory Visit (INDEPENDENT_AMBULATORY_CARE_PROVIDER_SITE_OTHER): Payer: 59 | Admitting: Psychology

## 2012-03-06 DIAGNOSIS — F4323 Adjustment disorder with mixed anxiety and depressed mood: Secondary | ICD-10-CM

## 2012-03-06 DIAGNOSIS — F331 Major depressive disorder, recurrent, moderate: Secondary | ICD-10-CM

## 2012-03-13 ENCOUNTER — Telehealth: Payer: Self-pay | Admitting: Family Medicine

## 2012-03-13 NOTE — Telephone Encounter (Signed)
They had requested interval notes and it was faxed back previously.

## 2012-03-13 NOTE — Telephone Encounter (Signed)
Pt is calling back after being contacted by someone in the ofc today ( she doesn't know the person's name.) York Spaniel person advises she has not been seen in the ofc since 07/12. Pt was seen in the ofc on 02/19/12, is out on short term disability. Dr. Laymond Purser was to give Dr. Para March her notes after pts appt on 03/06/12. She has another appt on 03/22/12 with Dr. Para March. Dr. Para March was to fill out the disability forms. The forms have not been received by her employer/[facility]. Pls call.

## 2012-03-13 NOTE — Telephone Encounter (Signed)
Caller: Debra Little/Patient; PCP: [name]); CB#: [phone]; ; ; Call regarding Follow Up Short Term Disability Forms; states office contacted her 03/07/12 and told her they received the forms.  The forms were to have been sent back to the STD/employer by 03/13/12; states they tell her they have not received those forms back.  No forms/contact info in Epic.   TC to office; caller transferred to office for further assistance.

## 2012-03-13 NOTE — Telephone Encounter (Signed)
Patient notified that completed forms were faxed back on 03/11/12 to [facility].

## 2012-03-13 NOTE — Telephone Encounter (Signed)
Patient notified as instructed by telephone. Patient stated that all she thinks that they need now is her next appointment date with Dr. Para March. Advised patient that they had called earlier this afternoon and Lyla Son had given them that information.

## 2012-03-13 NOTE — Telephone Encounter (Signed)
They had requested interval notes and it was faxed back previously. I don't what they need at this point.  Have them send what they need and have them process what we send back.   We'll start the process all over.

## 2012-03-20 ENCOUNTER — Ambulatory Visit (INDEPENDENT_AMBULATORY_CARE_PROVIDER_SITE_OTHER): Payer: 59 | Admitting: Psychology

## 2012-03-20 DIAGNOSIS — F411 Generalized anxiety disorder: Secondary | ICD-10-CM

## 2012-03-20 DIAGNOSIS — F331 Major depressive disorder, recurrent, moderate: Secondary | ICD-10-CM

## 2012-03-22 ENCOUNTER — Encounter: Payer: Self-pay | Admitting: Family Medicine

## 2012-03-22 ENCOUNTER — Ambulatory Visit (INDEPENDENT_AMBULATORY_CARE_PROVIDER_SITE_OTHER): Payer: 59 | Admitting: Family Medicine

## 2012-03-22 VITALS — BP 138/78 | HR 94 | Temp 98.2°F | Wt 152.8 lb

## 2012-03-22 DIAGNOSIS — F341 Dysthymic disorder: Secondary | ICD-10-CM

## 2012-03-22 DIAGNOSIS — F32A Depression, unspecified: Secondary | ICD-10-CM

## 2012-03-22 DIAGNOSIS — F329 Major depressive disorder, single episode, unspecified: Secondary | ICD-10-CM

## 2012-03-22 NOTE — Assessment & Plan Note (Signed)
Continue with f/u with Drs. Laymond Purser and [place].  Will cut one of the tranxene in half and see if that helps her energy level.  Continue as is with meds o/w.  No Si/Hi.  Still with sig anxiety sx and tearfulness and not yet ready to return to work but okay for outpatient f/u.  App help of all involved.  >25 min spent with face to face with patient, >50% counseling and/or coordinating care

## 2012-03-22 NOTE — Patient Instructions (Signed)
Cut back to 1/2 tab of tranxene as needed (can take 2.5 pills total in the day, spread across the day).   Keep the appointments with Dr. Nolen Mu and Laymond Purser and then see me in the last week of May.  Out of work in the meantime.

## 2012-03-22 NOTE — Progress Notes (Signed)
Last seen by me 02/16/12.  Prev note.    She's nervous, sweaty, worried, tired. Worst in the AM. Inc in job stress. Caring for brother. She's seeing Dr. Nolen Mu and rec'd psychology f/u. She would like to see Dr. Laymond Purser. She got some relief from the neurontin at 100mg  but was very drowsy the next day. She is going to try go cut back to 50mg  a day. It does help with aches. Her son was recently kicked out by her husband. She's been more tearful, esp Monday AM.  She's on short term disability now. Needs papers filled out to reflect her situation.  [employer] asked her to inc the tranxene to 3 a day. We may need to consider other options (liquid gabapentin vs other med) if she can't get 50mg  gabapentin as  is or if she doesn't get relief. She is seeing Dr. Nolen Mu again in May.   Today's note:  Her urinary sx are better in the interval.  She's still really sore diffusely, tearful and fatiged.  Her husband let the bills get behind and now their house is in foreclosure.  Per patient, he didn't tell her because "he didn't want to worry me."  She is working on alternate housing.  She's been served papers by [employer] about the foreclosure.   She's still trying to care for her brother.  He had aspirated previously.   She has seen Dr. Laymond Purser.  Will see Dr. Nolen Mu and Dr. Laymond Purser again each in May.   She would like to go back to work but she already feels overwhelmed and is worried about breaking into tears at work.  We talked about exercise and lifestyle changes.  She's hasn't been getting out a lot but can work on that.   The tranxene helps with anxiety but she can be drowsy the next day after taking 3 in a day.  She can break it in half.   "Who can you depend on now?"  "I hope, my husband.  And the Lord."  She has some support of cousins.   No Si/Hi.   She's trying to stay positive.    Meds, vitals, and allergies reviewed.   ROS: See HPI.  Otherwise, noncontributory.  Tearful but regains composure.     rrr ctab No tremor

## 2012-04-01 ENCOUNTER — Telehealth: Payer: Self-pay | Admitting: Family Medicine

## 2012-04-01 NOTE — Telephone Encounter (Signed)
These were sent last month.  Please clarify with them.

## 2012-04-01 NOTE — Telephone Encounter (Signed)
Message copied by Lars Mage on Mon Apr 01, 2012  5:12 PM ------      Message from: Cecille Po      Created: Mon Apr 01, 2012  4:40 PM      Contact: Debra Little is calling regarding her short term disability claim. The patient stated that she saw Dr. Para March on 4/26,and that papers were sent over to [facility] regarding her claim. [place] told Debra Little that they faxed over papers stating they needed more information and medical records by 5/3. Patient states that they have denied her claim. Please return a call to her as soon as possible regarding this. Thank-you. [phone]

## 2012-04-01 NOTE — Telephone Encounter (Signed)
I called her.  I don't know why this was denied.  I told the patient that.  I asked patient to call the company and have them call us.  She agreed.  I asked for patient to get the company to ask for [place].

## 2012-04-01 NOTE — Telephone Encounter (Signed)
Pt is saying that she was denied Short Term Disability because forms were not sent back that were faxed in on the 03/26/12 concerning more questions reguarding the short term disability. She is wondering what she needs to do ??

## 2012-04-09 ENCOUNTER — Ambulatory Visit (INDEPENDENT_AMBULATORY_CARE_PROVIDER_SITE_OTHER): Payer: 59 | Admitting: Psychology

## 2012-04-09 DIAGNOSIS — F331 Major depressive disorder, recurrent, moderate: Secondary | ICD-10-CM

## 2012-04-09 DIAGNOSIS — F411 Generalized anxiety disorder: Secondary | ICD-10-CM

## 2012-04-11 ENCOUNTER — Telehealth: Payer: Self-pay | Admitting: Family Medicine

## 2012-04-11 NOTE — Telephone Encounter (Signed)
Patient called to let you know that Daisey Must will be calling you today.  Patient told Daisey Must to call on the physician line and ask for Lugene.

## 2012-04-11 NOTE — Telephone Encounter (Signed)
Noted.  She saw Dr. Laymond Purser earlier this week.  Her anxiety, depression, and fatigue continue.

## 2012-04-15 ENCOUNTER — Telehealth: Payer: Self-pay | Admitting: Family Medicine

## 2012-04-15 NOTE — Telephone Encounter (Signed)
Seward Grater is the contact person to reach.  I called and tried to get through but couldn't.  I'll try to call back later.

## 2012-04-15 NOTE — Telephone Encounter (Signed)
I called and tried to get through.  No one in her department could answer the call.  I'll try to call back later.

## 2012-04-15 NOTE — Telephone Encounter (Signed)
Caller: Brittony/Patient; PCP: [name]); CB#: [phone];  Call regarding Debra Little (employer) Called On 04/12/12 To Check On Short Term Disablity Documentation From Dr. Para March Giving Medical Reason for Not Being Able To Work At This Time; They did not get through to the office. [employer] is calling to check on Paperwork. Call tx to the office to speak with Lugae.

## 2012-04-15 NOTE — Telephone Encounter (Signed)
[phone].  Use option 0.  This is the number to call the company.

## 2012-04-16 ENCOUNTER — Telehealth: Payer: Self-pay | Admitting: Family Medicine

## 2012-04-16 NOTE — Telephone Encounter (Signed)
I called again (3rd try).  LMOVM.  I asked them to call back.  I'll await return call.

## 2012-04-16 NOTE — Telephone Encounter (Signed)
Caller: Maya/Patient; Phone Number: [phone]; Message from caller: Pt spoke w/Dr [facility] nurse yesterday but has not yet received call back.  She is having trouble with her disability and needs to speak with her again.  Please call at your earliest convenience.

## 2012-04-16 NOTE — Telephone Encounter (Signed)
See prev phone note.  I've call mult time to disability office.  Notify pt.  Thanks.

## 2012-04-17 ENCOUNTER — Telehealth: Payer: Self-pay | Admitting: Family Medicine

## 2012-04-17 NOTE — Telephone Encounter (Signed)
Call back from [place].  Pt is approved through 04/29/12.  Next deadline is 05/06/12.  I'll send the next OV note after she comes back at the end of the month.  They have notified the patient.

## 2012-04-17 NOTE — Telephone Encounter (Signed)
I got notice of pt's denial.  I called for the 4th time and was transferred and then parked on hold w/o an answer.  I called for the 5th time and explained to the switchboard operator that I was completely disappointed in how this had been handled.    I finally got through to [facility] [phone].  I explained the situation in detail.  She understood and would review. I gave the extension date as 04/29/12 as this will allow for her next OV with me and Dr. Laymond Purser.  This date is not fixed, it'll depend on her symptoms and status.  We'll address her status at the next OV.  I will await a return call from [place].  I thanked her for her effort.   Please notify pt.

## 2012-04-17 NOTE — Telephone Encounter (Signed)
Updated, see next phone note.

## 2012-04-17 NOTE — Telephone Encounter (Signed)
Patient notified as instructed by telephone. Was informed by patient that she got a call today stating that this has been approved. Patient wanted to thank Dr. Para March for taking care of this.

## 2012-04-23 ENCOUNTER — Ambulatory Visit: Payer: 59 | Admitting: Psychology

## 2012-04-24 ENCOUNTER — Ambulatory Visit (INDEPENDENT_AMBULATORY_CARE_PROVIDER_SITE_OTHER): Payer: 59 | Admitting: Family Medicine

## 2012-04-24 ENCOUNTER — Encounter: Payer: Self-pay | Admitting: Family Medicine

## 2012-04-24 VITALS — BP 132/84 | HR 76 | Temp 98.6°F | Wt 150.8 lb

## 2012-04-24 DIAGNOSIS — F419 Anxiety disorder, unspecified: Secondary | ICD-10-CM

## 2012-04-24 DIAGNOSIS — K589 Irritable bowel syndrome without diarrhea: Secondary | ICD-10-CM

## 2012-04-24 DIAGNOSIS — F32A Depression, unspecified: Secondary | ICD-10-CM

## 2012-04-24 DIAGNOSIS — F341 Dysthymic disorder: Secondary | ICD-10-CM

## 2012-04-24 DIAGNOSIS — F329 Major depressive disorder, single episode, unspecified: Secondary | ICD-10-CM

## 2012-04-24 MED ORDER — DICYCLOMINE HCL 10 MG PO CAPS: 10.0000 mg | ORAL_CAPSULE | Freq: Three times a day (TID) | ORAL | Status: DC

## 2012-04-24 NOTE — Patient Instructions (Signed)
Keep taking the higher dose of imipramine and come back and see me in about 6 weeks, after you see Dr. Nolen Mu.   Take the bentyl for the constipation.  See if that helps.

## 2012-04-24 NOTE — Progress Notes (Signed)
Had been seen by Dr. Nolen Mu last week (04/18/12) and TCA was increased.  She is trying to adjust to the dose.  Still frequently crying, tearful, worried.  Taking up to 3 tranxene a day now.  Still on gabapentin.  Sleep is minimally tolerable with meds.  She'll get intermittent palpitations and chest pain, likely related to anxiety.  Inc in heartburn in the meantime, also likely related to anxiety.    Her housing situation- they talked with a lawyer and are trying short sell and move quickly to avoid a foreclosure, if possible.  She still may be foreclosed, and this is up in the air.  Her husband is trying to be supportive and this is a good change.  He's paying bills now; she's trying to monitor this.  She's been trying to sell items to cover bills.   She has f/u with Dr. Laymond Purser next week.    Sleep decreased/ Insomnia: yes Interest decreased in activities: yes Guilt or worthlessness:yes Energy decreased:yes Concentration difficulties: yes Appetite disturbance or weight loss: no Aagitation: no Suicidal thoughts:no  "I want to work but I'm having trouble thinking.  I want to be normal again."   Was prev treated with dicyclomine for likely IBS in periods of high stress.  Asking about trying this again.  She has been more constipated in the meantime.  No blood in stool.  This may be worse on the TCA, but this isn't clear.   Meds, vitals, and allergies reviewed.   ROS: See HPI.  Otherwise, noncontributory.  Affect is flat today.   Mmm rrr ctab abd soft, not ttp

## 2012-04-25 ENCOUNTER — Ambulatory Visit: Payer: 59 | Admitting: Family Medicine

## 2012-04-25 NOTE — Assessment & Plan Note (Signed)
Hold out for another 6 weeks then f/u with me.  She isn't ready to return to work due to symptoms above.  She is in midst of med titration as above. She contracts for safety.  Continue with f/u with Drs. Laymond Purser and [place] as planned. Continue as is with meds o/w. No Si/Hi. Still with sig anxiety sx and tearfulness and not yet ready to return to work but okay for outpatient f/u. App help of all involved. >25 min spent with face to face with patient, >50% counseling and/or coordinating care.

## 2012-04-25 NOTE — Assessment & Plan Note (Signed)
Gradually uptitrate the bentyl to see if this helps with IBS sx.

## 2012-05-08 ENCOUNTER — Ambulatory Visit (INDEPENDENT_AMBULATORY_CARE_PROVIDER_SITE_OTHER): Payer: 59 | Admitting: Psychology

## 2012-05-08 DIAGNOSIS — F331 Major depressive disorder, recurrent, moderate: Secondary | ICD-10-CM

## 2012-05-08 DIAGNOSIS — F411 Generalized anxiety disorder: Secondary | ICD-10-CM

## 2012-05-28 ENCOUNTER — Ambulatory Visit (INDEPENDENT_AMBULATORY_CARE_PROVIDER_SITE_OTHER): Payer: 59 | Admitting: Family Medicine

## 2012-05-28 ENCOUNTER — Encounter: Payer: Self-pay | Admitting: Family Medicine

## 2012-05-28 VITALS — BP 130/76 | HR 90 | Temp 98.6°F | Wt 151.0 lb

## 2012-05-28 DIAGNOSIS — F32A Depression, unspecified: Secondary | ICD-10-CM

## 2012-05-28 DIAGNOSIS — F341 Dysthymic disorder: Secondary | ICD-10-CM

## 2012-05-28 DIAGNOSIS — F329 Major depressive disorder, single episode, unspecified: Secondary | ICD-10-CM

## 2012-05-28 NOTE — Patient Instructions (Addendum)
Start the zoloft and follow up in 6 weeks.  Ask Dr. Laymond Purser tomorrow at the appointment about support groups.  Out of work in the meantime, until cleared by MD.

## 2012-05-28 NOTE — Progress Notes (Signed)
They are closing the condo 06/06/12 and she'll be moving to her parent's house.  She can get angry with her husband when she considers the situation.  "Once I get it out of me, I get better for a while."  Still very anxious and worried.  Saw Dr. Nolen Mu and will start on sertraline, in addition to the tofranil.  Seeing Dr. Laymond Purser tomorrow for counseling.   Has been crying.  Tearful.  Distracted easily.  Has been perseverating on losses and upheaval recently.  Sleep is disrupted and she remains fatigued.    Still needing help with constipation and bentyl didn't help.    She continues to be worried about her son (who keeps asking her for money) and her brother (who is ill and in a nursing home- Armed forces operational officer).    She remains out of work.  She is worried about her ability to complete tasks at work.  No Si/Hi.    Meds, vitals, and allergies reviewed.   ROS: See HPI.  Otherwise, noncontributory.  Affect is flat today, tearful Mmm  rrr  ctab  abd soft, not ttp Speech fluent Judgement intact

## 2012-05-28 NOTE — Assessment & Plan Note (Signed)
Hold out for another 6 weeks then f/u with me. She isn't ready to return to work due to symptoms above. She is in midst of med titration as above with SSRI start. She contracts for safety. Continue with f/u with Drs. Laymond Purser and [place] as planned. Continue as is with meds o/w. No Si/Hi. Still with sig anxiety sx and not yet ready to return to work but okay for outpatient f/u. App help of all involved.

## 2012-05-29 ENCOUNTER — Ambulatory Visit (INDEPENDENT_AMBULATORY_CARE_PROVIDER_SITE_OTHER): Payer: 59 | Admitting: Psychology

## 2012-05-29 DIAGNOSIS — F411 Generalized anxiety disorder: Secondary | ICD-10-CM

## 2012-05-29 DIAGNOSIS — F331 Major depressive disorder, recurrent, moderate: Secondary | ICD-10-CM

## 2012-06-06 ENCOUNTER — Telehealth: Payer: Self-pay

## 2012-06-06 NOTE — Telephone Encounter (Signed)
Debra Little with Debra Little request pts next appt date and time. I requested Debra Little to fax copy of release of information from pt to our office and then Debra Little will call at later time for info.

## 2012-06-18 ENCOUNTER — Ambulatory Visit: Payer: 59 | Admitting: Psychology

## 2012-06-28 ENCOUNTER — Encounter: Payer: Self-pay | Admitting: Family Medicine

## 2012-06-28 ENCOUNTER — Ambulatory Visit (INDEPENDENT_AMBULATORY_CARE_PROVIDER_SITE_OTHER): Payer: 59 | Admitting: Family Medicine

## 2012-06-28 VITALS — BP 128/80 | HR 81 | Temp 98.3°F | Ht 67.0 in | Wt 153.0 lb

## 2012-06-28 DIAGNOSIS — IMO0001 Reserved for inherently not codable concepts without codable children: Secondary | ICD-10-CM

## 2012-06-28 DIAGNOSIS — R350 Frequency of micturition: Secondary | ICD-10-CM

## 2012-06-28 DIAGNOSIS — N390 Urinary tract infection, site not specified: Secondary | ICD-10-CM | POA: Insufficient documentation

## 2012-06-28 LAB — POCT URINALYSIS DIPSTICK
Bilirubin, UA: NEGATIVE
Blood, UA: NEGATIVE
Glucose, UA: NEGATIVE
Ketones, UA: NEGATIVE
Nitrite, UA: NEGATIVE
Spec Grav, UA: <=1.005
Urobilinogen, UA: 0.2
pH, UA: 6.5

## 2012-06-28 NOTE — Progress Notes (Signed)
Dysuria:yes prev, frequency- today is the 3rd day duration of symptoms:see above abdominal pain: no fevers:no back pain: occ, mild, but she has been lifting boxes with her move into the new house Vomiting: no other concerns: took one dose of cipro yesterday along with AZO. She had cipro rx from urology and doesn't need a refill.  Fatigued, fair appetite.    ua reviewed with patient.   Meds, vitals, and allergies reviewed.   ROS: See HPI.  Otherwise negative.    GEN: nad, alert and oriented HEENT: mucous membranes moist NECK: supple CV: rrr. Murmur noted PULM: ctab, no inc wob ABD: soft, +bs, suprapubic area mildly tender EXT: no edema SKIN: no acute rash BACK: no CVA pain

## 2012-06-28 NOTE — Patient Instructions (Addendum)
Drink plenty of water and start the antibiotics today.  We'll contact you with your lab report.  Take care.   

## 2012-06-28 NOTE — Assessment & Plan Note (Signed)
Continue cipro, check ucx and f/u prn.  She is having more aches.  This is likely related to combination of fibromyalgia, exertion with the move, and UTI.  Will follow clinically.

## 2012-06-30 LAB — URINE CULTURE: Colony Count: 7000

## 2012-07-02 ENCOUNTER — Ambulatory Visit (INDEPENDENT_AMBULATORY_CARE_PROVIDER_SITE_OTHER): Payer: 59 | Admitting: Psychology

## 2012-07-02 DIAGNOSIS — F331 Major depressive disorder, recurrent, moderate: Secondary | ICD-10-CM

## 2012-07-02 DIAGNOSIS — F411 Generalized anxiety disorder: Secondary | ICD-10-CM

## 2012-07-09 ENCOUNTER — Ambulatory Visit (INDEPENDENT_AMBULATORY_CARE_PROVIDER_SITE_OTHER): Payer: 59 | Admitting: Family Medicine

## 2012-07-09 ENCOUNTER — Encounter: Payer: Self-pay | Admitting: Family Medicine

## 2012-07-09 VITALS — BP 122/80 | HR 85 | Temp 97.9°F | Wt 151.0 lb

## 2012-07-09 DIAGNOSIS — F341 Dysthymic disorder: Secondary | ICD-10-CM

## 2012-07-09 DIAGNOSIS — F32A Depression, unspecified: Secondary | ICD-10-CM

## 2012-07-09 DIAGNOSIS — F329 Major depressive disorder, single episode, unspecified: Secondary | ICD-10-CM

## 2012-07-09 NOTE — Assessment & Plan Note (Signed)
Will proceed with another trial of SSRI to see if she can tolerate it.  Hold out of work for another 6 weeks.  She'll f/u with Dr. Nolen Mu and Dr. Laymond Purser in the meantime.  She doesn't appear able/ready to start back at work.

## 2012-07-09 NOTE — Progress Notes (Signed)
She's still trying to move and establish housing.  She has to be out of the old residence by the end of the week.    She still continues to be worried about her son (who keeps asking her for money) and her brother (who is ill and in a nursing home- Armed forces operational officer).  She remains concerned about her husband's health.  Has been crying. Tearful.  Still distracted easily. Has been perseverating on losses and upheaval recently. Sleep is disrupted and she remains fatigued. H/o IC and her dysuria sx are some better, intermittently.  She burst into tears today without a trigger at lunch.    She remains out of work. She is still worried about her ability to complete tasks at work.  No Si/Hi. She continues to have diffuse aches, "like I have the flu" and has hot flashes at night.  She has episodes of chills.   Moving back to her parent's house has been depressing for her.  She is approaching her limit with work leave.     She has seen Dr. Nolen Mu and Dr. Laymond Purser since the last OV and has f/u with both pending.   She's had some diarrhea with trial of sertraline and has to stop.  She'll restart soon.    Meds, vitals, and allergies reviewed.   ROS: See HPI. Otherwise, noncontributory.   Affect is flat today Mmm  rrr  ctab  abd soft, not ttp  Speech fluent  Judgement intact

## 2012-07-09 NOTE — Patient Instructions (Addendum)
Try the sertraline again and keep the follow up appointments with Dr. Laymond Purser and Dr. Nolen Mu.

## 2012-07-18 ENCOUNTER — Encounter: Payer: Self-pay | Admitting: Family Medicine

## 2012-07-18 ENCOUNTER — Ambulatory Visit (INDEPENDENT_AMBULATORY_CARE_PROVIDER_SITE_OTHER): Payer: 59 | Admitting: Family Medicine

## 2012-07-18 VITALS — BP 128/78 | HR 91 | Temp 98.3°F | Wt 151.0 lb

## 2012-07-18 DIAGNOSIS — F341 Dysthymic disorder: Secondary | ICD-10-CM

## 2012-07-18 DIAGNOSIS — F329 Major depressive disorder, single episode, unspecified: Secondary | ICD-10-CM

## 2012-07-18 DIAGNOSIS — F32A Depression, unspecified: Secondary | ICD-10-CM

## 2012-07-18 NOTE — Patient Instructions (Addendum)
Stay on the zoloft and keep the appointments with Nolen Mu and Laymond Purser.  Give me an update in a few weeks.

## 2012-07-18 NOTE — Assessment & Plan Note (Signed)
See letter from today re: STD vs LTD.  She doesn't appear to be able to work currently.  Okay for outpatient f/u.  If not improved with current meds, then cymbalta would be a reasonable option.  She agrees with plan.  Will keep f/u with psychology and psychiatry and then weill update me. No SI/HI.

## 2012-07-18 NOTE — Progress Notes (Signed)
She was able to get moved out of the old residence. Is on zoloft now, 25mg  a day.  She gets flushed/sweaty with this bus is able to tolerate so far.  Hasn't had time for effect from medicine yet.  Still tearful, will break into tears w/o warning.  Mood is still depressed.  Still with lack of motivation.  Fatigued.  She's still worried about her ability to complete her tasks at work if she goes back now.  No Si/Hi.  She does have hope for improvement.  She does want to work eventually.    Her marriage continues to be a source of stress and she is worried about her husband. She is also worried about her brother and son.   She has had f/u with psychology and psychiatry.   Has f/u with both pending.    Has papers re: STD and LTD.   Taking miralax for constipation, with partial relief.   Taking it once a day.    Meds, vitals, and allergies reviewed.   ROS: See HPI.  Otherwise, noncontributory.  Affect is flat.   Nearly tearful during exam.  Speech pattern is flat No tremor rrr soft murmur noted No tremor Judgement intact

## 2012-07-30 ENCOUNTER — Ambulatory Visit (INDEPENDENT_AMBULATORY_CARE_PROVIDER_SITE_OTHER): Payer: 59 | Admitting: Psychology

## 2012-07-30 DIAGNOSIS — F331 Major depressive disorder, recurrent, moderate: Secondary | ICD-10-CM

## 2012-07-30 DIAGNOSIS — F411 Generalized anxiety disorder: Secondary | ICD-10-CM

## 2012-08-01 ENCOUNTER — Telehealth: Payer: Self-pay | Admitting: Family Medicine

## 2012-08-01 NOTE — Telephone Encounter (Signed)
Patient notified as instructed by telephone. 

## 2012-08-01 NOTE — Telephone Encounter (Signed)
Caller: [name]/Patient; Patient Name: Debra Little; PCP: [name]) [facility]); Best Callback Phone Number: [phone].  Call regarding Insurance lapsing on 9-6.  Patient requesting order to draw blood work on 9-6 before insurance lapses.  Patient is scheduled office visit on 08-20-12.  Patient has no symptoms to assess.  PLEASE FOLLOW UP WITH PATIENT IF MD WILL WRITE LAB ORDER.

## 2012-08-01 NOTE — Telephone Encounter (Signed)
She doesn't need labs drawn.

## 2012-08-20 ENCOUNTER — Ambulatory Visit (INDEPENDENT_AMBULATORY_CARE_PROVIDER_SITE_OTHER): Payer: 59 | Admitting: Family Medicine

## 2012-08-20 ENCOUNTER — Encounter: Payer: Self-pay | Admitting: Family Medicine

## 2012-08-20 VITALS — BP 128/72 | HR 87 | Temp 98.1°F | Wt 153.8 lb

## 2012-08-20 DIAGNOSIS — F419 Anxiety disorder, unspecified: Secondary | ICD-10-CM

## 2012-08-20 DIAGNOSIS — F341 Dysthymic disorder: Secondary | ICD-10-CM

## 2012-08-20 DIAGNOSIS — F32A Depression, unspecified: Secondary | ICD-10-CM

## 2012-08-20 DIAGNOSIS — F329 Major depressive disorder, single episode, unspecified: Secondary | ICD-10-CM

## 2012-08-20 NOTE — Patient Instructions (Addendum)
Let me talk to [employer] and we'll be in touch.  Take care.

## 2012-08-20 NOTE — Progress Notes (Signed)
She couldn't tolerate zoloft due to nausea and diarrhea.  She's moved into the new housing but not unpacked.  Husband is working.  She's not at work, off STD.  She's applying for LTD (it is pending); still seeing Dr. Laymond Purser and Dr. Nolen Mu (last saw [facility] 06/2012).  Mood is still down.  Affect is flat. She's more worried about her situation than prev.  Sleep is fair, inc in dreams but no nightmares.  Crying more (mult friends have died recently).  Her husband is not accepting responsibility for financial stress that contributed to this episode.   She still feel overwhelmed.  No Si/Hi.  Son is due in court in the near future (and asking he for money) and this is stressful for her.   She feels some better with 3 tranxene pills in a day for the anxiety, but is still anxious and tearful.     Meds, vitals, and allergies reviewed.   ROS: See HPI.  Otherwise, noncontributory.  Affect is flat.  Nearly tearful during exam.  Speech pattern is flat  No tremor  rrr soft murmur noted  No tremor  Judgement intact

## 2012-08-21 NOTE — Assessment & Plan Note (Signed)
No SI/HI.  I have d/w Dr. Laymond Purser and she continues to consider the patient's case.  I have called Dr. Loralie Champagne clinic to discuss.  I will defer med changes at this point.  Pt agrees.

## 2012-08-23 ENCOUNTER — Telehealth: Payer: Self-pay | Admitting: Family Medicine

## 2012-08-23 MED ORDER — MIRTAZAPINE 15 MG PO TABS: 15.0000 mg | ORAL_TABLET | Freq: Every day | ORAL | Status: DC

## 2012-08-23 NOTE — Telephone Encounter (Signed)
Call pt.  I talked with Dr. Laymond Purser and Dr. Nolen Mu.  [employer] and I agreed that a trial of mirtazepine would be reasonable.  It shouldn't cause GI side effects and she should be able to tolerate it.  Take it at night.  It may help her sleep.  Have her call back with update in a few weeks. Thanks.  rx sent. Don't change her other meds.

## 2012-08-23 NOTE — Telephone Encounter (Signed)
Patient advised.

## 2012-08-27 ENCOUNTER — Ambulatory Visit: Payer: Self-pay | Admitting: Psychology

## 2012-09-05 ENCOUNTER — Telehealth: Payer: Self-pay | Admitting: Family Medicine

## 2012-09-05 NOTE — Telephone Encounter (Signed)
Patient advised.

## 2012-09-05 NOTE — Telephone Encounter (Signed)
At this point, I would ask that patient contact Dr. Nolen Mu for her advice.  I would try to continue the 7.5mg  in the meantime.  Thanks.

## 2012-09-05 NOTE — Telephone Encounter (Signed)
Caller: [name]/Patient; Patient Name: Debra Little; PCP: [name]) [facility]); Best Callback Phone Number: [phone]. Patient states she was instructed by Dr Para March to call back and give update as to how the new medication Mirtazapine 15 mg is working for her.  Patient states she feels medication is making her to sleepy and she had even tried to cut tablet in half (7.5 mg).  Patient would like to laso report she was denied long term dissability and has no insurance and job terminated.  PLEASE F/U WITH PATIENT.  THANK YOU.

## 2012-09-23 ENCOUNTER — Telehealth: Payer: Self-pay | Admitting: Family Medicine

## 2012-09-23 NOTE — Telephone Encounter (Signed)
If she has some improvement, then print a letter stating that she is released from disability as of today.  Thanks.

## 2012-09-23 NOTE — Telephone Encounter (Signed)
She is calling because she was out on short term disability for depression and her long term was denied.  Her job was also terminated so currently she gets no income.  She would like to know if Dr Para March could release her and that way she could at least apply for her unemployment so will have some income.  She said she is some better than she was but not 100%.  She said doctor has told her since she has no insurance to call to see if they can handle things without her coming in the office.

## 2012-09-24 ENCOUNTER — Encounter: Payer: Self-pay | Admitting: *Deleted

## 2012-09-24 NOTE — Telephone Encounter (Signed)
Letter printed for signature.  Patient advised.

## 2012-11-13 ENCOUNTER — Ambulatory Visit: Payer: Self-pay

## 2013-02-24 ENCOUNTER — Telehealth: Payer: Self-pay

## 2013-02-24 ENCOUNTER — Telehealth: Payer: Self-pay | Admitting: Family Medicine

## 2013-02-24 ENCOUNTER — Ambulatory Visit (INDEPENDENT_AMBULATORY_CARE_PROVIDER_SITE_OTHER): Payer: Self-pay | Admitting: Family Medicine

## 2013-02-24 ENCOUNTER — Encounter: Payer: Self-pay | Admitting: Family Medicine

## 2013-02-24 VITALS — BP 162/82 | HR 89 | Temp 97.5°F | Wt 152.8 lb

## 2013-02-24 DIAGNOSIS — R0789 Other chest pain: Secondary | ICD-10-CM | POA: Insufficient documentation

## 2013-02-24 DIAGNOSIS — R300 Dysuria: Secondary | ICD-10-CM

## 2013-02-24 DIAGNOSIS — R071 Chest pain on breathing: Secondary | ICD-10-CM

## 2013-02-24 DIAGNOSIS — K589 Irritable bowel syndrome without diarrhea: Secondary | ICD-10-CM

## 2013-02-24 LAB — POCT URINALYSIS DIPSTICK
Bilirubin, UA: NEGATIVE
Glucose, UA: NEGATIVE
Ketones, UA: NEGATIVE
Nitrite, UA: NEGATIVE
Protein, UA: NEGATIVE
Spec Grav, UA: <=1.005
Urobilinogen, UA: NEGATIVE
pH, UA: 6

## 2013-02-24 NOTE — Telephone Encounter (Signed)
She was advised to keep the appointment and this is reasonable.  I didn't send the rx.  Will see this afternoon.

## 2013-02-24 NOTE — Progress Notes (Signed)
dysuria:yes duration of symptoms: a few days abdominal pain:yes, lower abd cramps, see below fevers:no back pain: lower back pain Vomiting:no  She has already taken some cipro and some AZO.    She has been having lower abd cramps with BMs.  She has a h/o IBS.  She has had some blood per rectum.  She had a colonoscopy last in 2010.  I offered GI eval.  She is trying to get insurance and wanted to hold off on the referral.   Her brother was in [facility] for 2 weeks with a UTI, c diff, and a bowel obstruction with subsequent surgery.  Then he was back in the ER for a wound infection.   She has nonexertional episodic R sided anterior chest pain.  She has no known CAD.  She has sig troubles with anxiety, see her brother's history above.    Meds, vitals, and allergies reviewed.   ROS: See HPI.  Otherwise negative.    GEN: nad, alert and oriented HEENT: mucous membranes moist NECK: supple CV: rrr. Chest wall diffusely ttp w/o bruising PULM: ctab, no inc wob ABD: soft, +bs, suprapubic area not tender EXT: no edema SKIN: no acute rash BACK: no CVA pain

## 2013-02-24 NOTE — Telephone Encounter (Signed)
See following phone note. 

## 2013-02-24 NOTE — Telephone Encounter (Signed)
Pt left v/m; pt had few cipro at home and started taking due to UTI; pt does not have health insurance and is going to cancel appt already scheduled today at 2:15 due to cannot afford to come for appt. Pt request cipro sent to [facility]  [employer]. I tried to call pt to get UTI symptoms but could not reach pt by phone.Please advise.

## 2013-02-24 NOTE — Assessment & Plan Note (Signed)
Now with episode of likely vagal symptoms due to abd pain and small amount of bright red blood per rectum x1.  I encouraged GI eval.

## 2013-02-24 NOTE — Assessment & Plan Note (Signed)
Presumed UTI, continue cipro.  No reason to check ucx with cipro already used by patient.  Nontoxic.

## 2013-02-24 NOTE — Patient Instructions (Addendum)
If you need a referral for GI, let me know. I want you to see the GI clinic. Drink plenty of water and keep taking the cipro twice a day for 3 days total.   Check your BP out of the clinic- in the AM after you have urinated and then sat down for a few minutes- let me know if your BP is consistently >140/90.   Take care.

## 2013-02-24 NOTE — Assessment & Plan Note (Signed)
Nonexertional, no reason to suspect intrathoracic source.

## 2013-02-24 NOTE — Telephone Encounter (Signed)
Patient Information:  Caller Name: Aireona  Phone: [phone]  Patient: Debra Little, Debra Little  Gender: Female  DOB: 03-Oct-1956  Age: 57 Years  PCP: [name]) [facility])  Pregnant: No  Office Follow Up:  Does the office need to follow up with this patient?: No  Instructions For The Office: N/A  RN Note:  Pt already has appt at 2:15 today.  Had 1 pill of Cipro on hand and took it this morning.  States she does not have the amount of money needed to be seen.  Per dispositon, pt should be seen today.  Transferred to office to discuss payment options.  Advised pt to keep appt.  Symptoms  Reason For Call & Symptoms: Burning with urination  Reviewed Health History In EMR: Yes  Reviewed Medications In EMR: Yes  Reviewed Allergies In EMR: Yes  Reviewed Surgeries / Procedures: Yes  Date of Onset of Symptoms: 02/21/2013  Treatments Tried: Had Cipro on hand, has taken 1 pill 500mg   Treatments Tried Worked: No OB / GYN:  LMP: Unknown  Guideline(s) Used:  Urination Pain - Female  Disposition Per Guideline:   Go to Office Now  Reason For Disposition Reached:   Side (flank) or lower back pain present  Advice Given:  N/A  Patient Will Follow Care Advice:  YES  Appointment Scheduled:  02/24/2013 14:15:00 Appointment Scheduled Provider:  N/A

## 2013-03-05 ENCOUNTER — Telehealth: Payer: Self-pay | Admitting: Family Medicine

## 2013-03-05 NOTE — Telephone Encounter (Signed)
No more abx.  We couldn't culture her last time since she was already on cipro.  She needs an OV.  I can't do anything safely w/o seeing her.

## 2013-03-05 NOTE — Telephone Encounter (Signed)
Advised patient.  She states she just cant afford to come in for another office visit and asks if she can come in and leave urine sample for culture. I told her that would require office visit, but I would ask you.  Please advise.

## 2013-03-05 NOTE — Telephone Encounter (Signed)
Patient Information:  Caller Name: Debra Little  Phone: [phone]  Patient: Debra, Little  Gender: Female  DOB: December 24, 1955  Age: 57 Years  PCP: Debra Givens Clelia Croft) [facility])  Pregnant: No  Office Follow Up:  Does the office need to follow up with this patient?: Yes  Instructions For The Office: OFFICE PLEASE FOLLOW UP WITH PATIENT IF SHE CAN COME BY TO HAVE HER URINE TESTED FOR INFECTION.  RN Note:  Pt is requesting to come in for urine test only.  Pt states she does not have insurance and cannot afford another visit this soon.  Symptoms  Reason For Call & Symptoms: pt was seen in the office on 02/24/13 for UTI.  Pt had already started Cipro before visit.  Pt took Cipro x 4 days.  Pt is still having pain in lower back and some in kidneys.  No burning with urination.  Pt does report frequency.  Reviewed Health History In EMR: Yes  Reviewed Medications In EMR: Yes  Reviewed Allergies In EMR: Yes  Reviewed Surgeries / Procedures: Yes  Date of Onset of Symptoms: 02/17/2013 OB / GYN:  LMP: Unknown  Guideline(s) Used:  Urination Pain - Female  Disposition Per Guideline:   Go to Office Now  Reason For Disposition Reached:   Side (flank) or lower back pain present  Advice Given:  Fluids:   Drink extra fluids. Drink 8-10 glasses of liquids a day (Reason: to produce a dilute, non-irritating urine).  Cranberry Juice:   Some people think that drinking cranberry juice may help in fighting urinary tract infections. However, there is no good research that has ever proved this.  Dosage Cranberry Juice Cocktail: 8 oz (240 ml) twice a day.  Warm Saline SITZ Baths to Reduce Pain:  Sit in a warm saline bath for 20 minutes to cleanse the area and to reduce pain. Add 2 oz. of table salt or baking soda to a tub of water.  Call Back If:  You become worse.  Patient Refused Recommendation:  Patient Refused Care Advice  Pt is requesting to come in to have her urine tested only to  see if there is an infection.

## 2013-03-05 NOTE — Telephone Encounter (Signed)
Advised patient, she says she is unable to pay for visit at this time, so she will not be coming in.  I advised her to go through [employer] patient assistance but she says she has called them 3 times and they have never sent her form to fill out to apply.  I suggested she call them again.

## 2013-03-05 NOTE — Telephone Encounter (Signed)
Spoke with patient and she doesn't have more money to come back for another OV, pt has no insurance and would like a different abx if possible. Please advise

## 2013-03-05 NOTE — Telephone Encounter (Signed)
This is not just about the urine culture.  She needs to be checked.

## 2013-03-05 NOTE — Telephone Encounter (Signed)
She needs an OV 

## 2013-05-05 ENCOUNTER — Ambulatory Visit (INDEPENDENT_AMBULATORY_CARE_PROVIDER_SITE_OTHER): Payer: BC Managed Care – PPO | Admitting: Family Medicine

## 2013-05-05 ENCOUNTER — Encounter: Payer: Self-pay | Admitting: Family Medicine

## 2013-05-05 VITALS — BP 126/68 | HR 88 | Temp 98.8°F | Wt 150.2 lb

## 2013-05-05 DIAGNOSIS — J069 Acute upper respiratory infection, unspecified: Secondary | ICD-10-CM

## 2013-05-05 DIAGNOSIS — J020 Streptococcal pharyngitis: Secondary | ICD-10-CM

## 2013-05-05 LAB — POCT RAPID STREP A (OFFICE): Rapid Strep A Screen: NEGATIVE

## 2013-05-05 NOTE — Patient Instructions (Addendum)
Drink plenty of fluids, take tylenol as needed, and gargle with warm salt water for your throat.  This should gradually improve.  Take care.  Let us know if you have other concerns.    

## 2013-05-05 NOTE — Assessment & Plan Note (Signed)
Sinuses not ttp, likely viral.  Notoxic.  F/u prn.  Supportive tx.  ddx d/w pt.

## 2013-05-05 NOTE — Progress Notes (Signed)
She started to get achy last week.  Started with back pain.  She tried taking gabapentin but it didn't help.  She had a ST.  Was around sick contacts last well. B ear pain, pain with swallowing. ST noted. Taking robitussin DM and Cepacol, tylenol.  She felt feverish.  Some cough, dry.  Still aching, usually worse in the afternoon. ST is worst at night.  Has been sick for about 1 week.    She is still looking for work.  She put in for jobs.  She has seen uro and GI recently.    Meds, vitals, and allergies reviewed.   ROS: See HPI.  Otherwise, noncontributory.  GEN: nad, alert and oriented HEENT: mucous membranes moist, tm w/o erythema, nasal exam w/o erythema, clear discharge noted,  OP with cobblestoning NECK: supple w/o LA CV: rrr.   PULM: ctab, no inc wob EXT: no edema SKIN: no acute rash  RST neg

## 2013-05-16 ENCOUNTER — Encounter: Payer: Self-pay | Admitting: *Deleted

## 2013-07-23 ENCOUNTER — Ambulatory Visit (INDEPENDENT_AMBULATORY_CARE_PROVIDER_SITE_OTHER): Payer: BC Managed Care – PPO | Admitting: Family Medicine

## 2013-07-23 ENCOUNTER — Encounter: Payer: Self-pay | Admitting: Family Medicine

## 2013-07-23 VITALS — BP 120/70 | HR 95 | Temp 98.1°F | Wt 152.0 lb

## 2013-07-23 DIAGNOSIS — R109 Unspecified abdominal pain: Secondary | ICD-10-CM

## 2013-07-23 NOTE — Patient Instructions (Addendum)
Shirlee Limerick will call about your referral for the ultrasound.   Take care.

## 2013-07-23 NOTE — Progress Notes (Signed)
R arm pain.  Yesterday she had R sided abd pain (at rest) that extended up to the R jaw. It got better eating a cracker. This hasn't happened before like this.  No pain now.  No clear trigger for the event. She'll occ have jaw pain when she wakes in the AM, but this isn't exertional and wasn't a/w the abd pain prev.  No exertional CP.  She is fatigued.  No fevers.  Still with some R shoulder pain.  She has a h/o likely rotator cuff pain on the R shoulder.    She has a h/o RUQ pain prev.  FH of GB pathology noted, ie grandmother.    She has seen GI and may have an EGD/colonoscopy in the future.    Had been on abx and uribel intermittently over the last 2 months.  Had f/u about this pending. She is trying to cut all caffeine due to h/o IC.   LFTs recently wnl, 6/14 at GI.    Meds, vitals, and allergies reviewed.   ROS: See HPI.  Otherwise, noncontributory.  GEN: nad, alert and oriented HEENT: mucous membranes moist, flat affect NECK: supple w/o LA CV: rrr. PULM: ctab, no inc wob ABD: soft, +bs, RUQ mildly ttp but no rebound EXT: no edema SKIN: no acute rash, no juandice R shoulder with mild pain on int/ext rotation and supraspinatus testing but no arm drop, no dec in AROM

## 2013-07-23 NOTE — Assessment & Plan Note (Signed)
Atypical sx but very likely not a cardiac, concern for GB given FH. LFTs okay 6/14.  Would check u/s and proceed from there.  She agrees.  Nontoxic. No CVA pain.

## 2013-07-25 ENCOUNTER — Ambulatory Visit
Admission: RE | Admit: 2013-07-25 | Discharge: 2013-07-25 | Disposition: A | Discharge status: Home / Self Care | Payer: BC Managed Care – PPO | Source: Ambulatory Visit | Attending: Family Medicine | Admitting: Family Medicine

## 2013-07-25 DIAGNOSIS — R109 Unspecified abdominal pain: Secondary | ICD-10-CM

## 2013-07-29 ENCOUNTER — Telehealth: Payer: Self-pay | Admitting: Family Medicine

## 2013-07-29 DIAGNOSIS — R109 Unspecified abdominal pain: Secondary | ICD-10-CM

## 2013-07-29 NOTE — Telephone Encounter (Signed)
Referral to GI ordered. See notes in u/s.  Thanks.

## 2013-09-09 ENCOUNTER — Ambulatory Visit: Payer: Self-pay

## 2013-09-11 ENCOUNTER — Ambulatory Visit: Payer: Self-pay

## 2013-09-16 ENCOUNTER — Ambulatory Visit: Payer: Self-pay

## 2013-09-23 ENCOUNTER — Ambulatory Visit (INDEPENDENT_AMBULATORY_CARE_PROVIDER_SITE_OTHER): Payer: BC Managed Care – PPO

## 2013-09-23 DIAGNOSIS — Z23 Encounter for immunization: Secondary | ICD-10-CM

## 2013-11-14 ENCOUNTER — Ambulatory Visit: Payer: Self-pay | Admitting: General Surgery

## 2013-11-17 ENCOUNTER — Encounter: Payer: Self-pay | Admitting: General Surgery

## 2013-12-04 ENCOUNTER — Ambulatory Visit (INDEPENDENT_AMBULATORY_CARE_PROVIDER_SITE_OTHER): Payer: Self-pay | Admitting: Internal Medicine

## 2013-12-04 ENCOUNTER — Encounter: Payer: Self-pay | Admitting: Internal Medicine

## 2013-12-04 VITALS — BP 124/72 | HR 89 | Temp 98.0°F | Wt 151.0 lb

## 2013-12-04 DIAGNOSIS — M2550 Pain in unspecified joint: Secondary | ICD-10-CM

## 2013-12-04 LAB — RHEUMATOID FACTOR: Rhuematoid fact SerPl-aCnc: <10 IU/mL (ref <=14)

## 2013-12-04 LAB — SEDIMENTATION RATE: Sed Rate: 6 mm/hr (ref 0–22)

## 2013-12-04 NOTE — Progress Notes (Signed)
Pre-visit discussion using our clinic review tool. No additional management support is needed unless otherwise documented below in the visit note.  

## 2013-12-04 NOTE — Patient Instructions (Signed)

## 2013-12-04 NOTE — Progress Notes (Signed)
Subjective:    Patient ID: Debra Little, female    DOB: [birth year], 58 y.o.   MRN: [id number]  HPI  Pt presents to the clinic today with c/o "arthritis pain". This started many years ago but has gotten worse the last 6 months. She did go to [place] [facility] for the same. Xrays were done and she was told that she arthritis. She reports that she has tried meloxicam, but stopped it because she felt like it was making her heart race. She is not positive that it is arthritis. She does hurt in her joint but all of her muscles hurt as well. She reports that she was diagnosed with fibromyalgia, but is unable to find a rheumatologist that would take her.  Review of Systems  Past Medical History  Diagnosis Date  . IBS (irritable bowel syndrome)     constipation predominant  . Hiatal hernia   . Cyst of breast     per [name]  . Vitamin D deficiency     history of  . Anxiety     sees Dr. [name]  . MVP (mitral valve prolapse)     [facility] cards eval 2012  . Fibromyalgia   . IC (interstitial cystitis)     per [place] uro    Current Outpatient Prescriptions  Medication Sig Dispense Refill  . ciprofloxacin (CIPRO) 250 MG tablet Take 250 mg by mouth 2 (two) times daily.       . clorazepate (TRANXENE-T) 7.5 MG tablet Take 7.5 mg by mouth 3 (three) times daily as needed.       . gabapentin (NEURONTIN) 100 MG capsule Take 50 mg by mouth daily as needed. Take 74m a day      . imipramine (TOFRANIL) 10 MG tablet Take 30 mg by mouth daily.       . Progesterone Micronized (PROGESTERONE, BULK,) POWD as needed.        No current facility-administered medications for this visit.    Allergies  Allergen Reactions  . Gabapentin     drowsy  . Bentyl [Dicyclomine Hcl]     Lack of effect  . Celecoxib     REACTION: unspecified  . Doxycycline     Palpitations and chest pain  . Nitrofurantoin Monohyd Macro   . Penicillins     REACTION: rash  . Sulfonamide Derivatives     REACTION:  nausea  . Zoloft [Sertraline Hcl]     nausea and diarrhea.       Family History  Problem Relation Age of Onset  . Cancer Mother     breast  . Cancer Father     colon    History   Social History  . Marital Status: Married    Spouse Name: N/A    Number of Children: 1  . Years of Education: N/A   Occupational History  . [employer]    Social History Main Topics  . Smoking status: Never Smoker   . Smokeless tobacco: Never Used  . Alcohol Use: No  . Drug Use: No  . Sexual Activity: Not on file   Other Topics Concern  . Not on file   Social History Narrative   Married in 1998, 1 son from previous relationship   Brother with MVA at 176 in rest home as of 2011.   Works at [employer]    Constitutional: Denies fever, malaise, fatigue, headache or abrupt weight changes.  Musculoskeletal: Denies decrease in range of motion, difficulty with gait.  Neurological: Denies dizziness, difficulty with memory, difficulty with speech or problems with balance and coordination.   No other specific complaints in a complete review of systems (except as listed in HPI above).     Objective:   Physical Exam   BP 124/72  Pulse 89  Temp(Src) 98 F (36.7 C) (Oral)  Wt 151 lb (68.493 kg)  SpO2 98% Wt Readings from Last 3 Encounters:  12/04/13 151 lb (68.493 kg)  07/23/13 152 lb (68.947 kg)  05/05/13 150 lb 4 oz (68.153 kg)    General: Appears her stated age, well developed, well nourished in NAD. Cardiovascular: Normal rate and rhythm. S1,S2 noted.  No murmur, rubs or gallops noted. No JVD or BLE edema. No carotid bruits noted. Pulmonary/Chest: Normal effort and positive vesicular breath sounds. No respiratory distress. No wheezes, rales or ronchi noted.  Musculoskeletal: Normal range of motion. No signs of joint swelling. No difficulty with gait.  Neurological: Alert and oriented. Cranial nerves II-XII intact. Coordination normal. +DTRs bilaterally.  BMET    Component Value  Date/Time   NA 141 09/30/2010 0000   K 3.8 09/30/2010 0000   GLUCOSE 91 09/30/2010 0000   BUN 12 09/30/2010 0000   CREATININE 0.84 09/30/2010 0000    Lipid Panel     Component Value Date/Time   CHOL 229 09/30/2010 0000   TRIG 90 09/30/2010 0000   HDL 55 09/30/2010 0000   LDLCALC 156 09/30/2010 0000    CBC No results found for this basename: wbc, rbc, hgb, hct, plt, mcv, mch, mchc, rdw, neutrabs, lymphsabs, monoabs, eosabs, basosabs    Hgb A1C No results found for this basename: HGBA1C        Assessment & Plan:   Multiple joint pains and generalized muscle aches:  Will check ESR, RF and [name] If positive will refer to [facility] rheumatology  Will follow up with you after test results

## 2013-12-05 ENCOUNTER — Other Ambulatory Visit: Payer: Self-pay

## 2013-12-05 DIAGNOSIS — Z1231 Encounter for screening mammogram for malignant neoplasm of breast: Secondary | ICD-10-CM

## 2013-12-05 LAB — ANA: Anti Nuclear Antibody(ANA): NEGATIVE

## 2013-12-19 ENCOUNTER — Encounter: Payer: Self-pay | Admitting: Family Medicine

## 2013-12-19 ENCOUNTER — Ambulatory Visit (INDEPENDENT_AMBULATORY_CARE_PROVIDER_SITE_OTHER): Payer: Self-pay | Admitting: Family Medicine

## 2013-12-19 VITALS — BP 122/78 | HR 88 | Temp 98.4°F | Wt 154.0 lb

## 2013-12-19 DIAGNOSIS — F32A Depression, unspecified: Secondary | ICD-10-CM

## 2013-12-19 DIAGNOSIS — F329 Major depressive disorder, single episode, unspecified: Secondary | ICD-10-CM

## 2013-12-19 DIAGNOSIS — F419 Anxiety disorder, unspecified: Principal | ICD-10-CM

## 2013-12-19 DIAGNOSIS — F341 Dysthymic disorder: Secondary | ICD-10-CM

## 2013-12-19 NOTE — Progress Notes (Signed)
Pre-visit discussion using our clinic review tool. No additional management support is needed unless otherwise documented below in the visit note.  She has still been seeing Dr. [name] episodically.  She lost her insurance.  She is diffusely aching. Had seen [facility] clinic about her R shoulder pain.  She had trouble taking mobic due to palpitations.  Aleve helps some but causes GI upset.  She is profoundly fatigued.  She continues to have a sensation of weakness in her legs, fell recently and hit her R knee.  Scabbed over in the meantime.  No LOC with the fall.  She continues to be anhedonic and nervous at the same time.   She has failed in her job applications. She was asking about disability.  See plan.   Meds, vitals, and allergies reviewed.   ROS: See HPI.  Otherwise, noncontributory.  nad Flat affect rrr ctab abd soft, not ttp Small scab with mild irritation near the R patella.

## 2013-12-19 NOTE — Patient Instructions (Signed)
Call about disability and see what they have to offer. Your knee should heal up.  Keep it clean.  Take care.

## 2013-12-21 NOTE — Assessment & Plan Note (Signed)
She has seen pysch episodically.  I don't have other ideas for treatment to offer.  She asks about disability.  I have nothing to offer other than her records.  She'll need to f/u with disability office. I don't do evals for disability.  Since I didn't change anything today, I no charged the visit.

## 2013-12-22 ENCOUNTER — Ambulatory Visit: Payer: Self-pay | Admitting: General Surgery

## 2013-12-25 ENCOUNTER — Ambulatory Visit
Admission: RE | Admit: 2013-12-25 | Discharge: 2013-12-25 | Disposition: A | Discharge status: Home / Self Care | Payer: Self-pay | Source: Ambulatory Visit

## 2013-12-25 DIAGNOSIS — Z1231 Encounter for screening mammogram for malignant neoplasm of breast: Secondary | ICD-10-CM

## 2014-01-06 ENCOUNTER — Encounter: Payer: Self-pay | Admitting: Family Medicine

## 2014-02-03 ENCOUNTER — Encounter: Payer: Self-pay | Admitting: *Deleted

## 2014-02-09 ENCOUNTER — Encounter: Payer: Self-pay | Admitting: Family Medicine

## 2014-02-09 ENCOUNTER — Ambulatory Visit (INDEPENDENT_AMBULATORY_CARE_PROVIDER_SITE_OTHER): Payer: BC Managed Care – PPO | Admitting: Family Medicine

## 2014-02-09 ENCOUNTER — Telehealth: Payer: Self-pay | Admitting: General Surgery

## 2014-02-09 VITALS — BP 122/64 | HR 81 | Temp 97.9°F | Wt 153.5 lb

## 2014-02-09 DIAGNOSIS — M25519 Pain in unspecified shoulder: Secondary | ICD-10-CM

## 2014-02-09 MED ORDER — TRAMADOL HCL 50 MG PO TABS: 50.0000 mg | ORAL_TABLET | Freq: Three times a day (TID) | ORAL | Status: DC | PRN

## 2014-02-09 NOTE — Patient Instructions (Signed)
I would try taking tramadol for now.  It can make you drowsy.   I wouldn't get the next steroid shot since you had a reaction twice.   I would look into the neuro vs. chiropractor evaluation.   Take care.

## 2014-02-09 NOTE — Progress Notes (Signed)
Pre visit review using our clinic review tool, if applicable. No additional management support is needed unless otherwise documented below in the visit note.  Had been seen by Dr. Dawayne Patricia with [name].  Was put on mobic and had heart racing.  She was asking about options today.  She had tolerated cortisone shots in the distant past, but the last two time she had cortisone injections she had skin redness and "like I was sunburned" all over.  She didn't have tongue or airway sx.  It lasted for a few days. She continues to have R arm and shoulder pain.  Per patient Dr. Joie Bimler wanted her to see neuro clinic.  She is considering seeking a second opinion from a chiropractor.  She isn't in PT yet.  "I hurt all over, but worse in the right shoulder."  She continues to have nocturnal R shoulder pain, worse laying on the R side. She has to pick up objects with both hands to prevent drops due to pain.  She is taking gabapentin and tylenol. She hasn't tried tramadol.    She put in for disability.    Meds, vitals, and allergies reviewed.   ROS: See HPI.  Otherwise, noncontributory.  nad Flat affect Neck supple, no LA Pain with int>ext rotation at the R shoulder.  No arm drop.   rrr ctab abd soft

## 2014-02-09 NOTE — Telephone Encounter (Signed)
02-09-14 PATIENT CALLED TODAY STATING SHE HAD RECEIVED A LETTER FROM [place].SHE HAD CANCELLED HER MAMMO FOLLOW UP APPOINTMENT  HERE FOR 12-22-13. SHE STATES SHE HAD  MAMMOGRAM(ORDERED BY [place]) @ [place] IN DECEMBER 2014.[place] SENT HER A LETTER SHOWING  HER MAMMOGRAM WAS OK,BUT HAD SOME DENSITY.SHE NEEDED TO KEEP A CHECK ON. SHE HAD HER PRIMARY DR Elsie Stain SCHEDULE  HER FOR A 3-D MAMMOGRAM IN [place]. SHE DOESN'T WANT TO MAKE AN APPOINTMENT AT THIS TIME.SHE WILL HAVE MAMMO THROUGH HER PRIMARY & RETURN HERE IF NEEDED.

## 2014-02-10 DIAGNOSIS — M25519 Pain in unspecified shoulder: Secondary | ICD-10-CM | POA: Insufficient documentation

## 2014-02-10 NOTE — Assessment & Plan Note (Signed)
Looks to have cuff sx on top of chronic pain from fibromyalgia. I'll defer the second opinion to her.  If she's had a reaction to steroid injection in the past, then I wouldn't repeat.  D/w pt.  She can try tramadol in the meantime.  Routine cautions given.

## 2014-03-24 ENCOUNTER — Telehealth: Payer: Self-pay | Admitting: Family Medicine

## 2014-03-24 MED ORDER — MACROBID 100 MG PO CAPS: 100.0000 mg | ORAL_CAPSULE | Freq: Two times a day (BID) | ORAL | Status: DC

## 2014-03-24 NOTE — Telephone Encounter (Signed)
H/o interstitial cystitis. This is really something that should be taken care of by [place] urology who saw her and ordered urinalysis. May try brand name macrobid (phone in) but keep appt with PCP for tomorrow unless he wants her to return to urology. Pt states has taken brand macrobid well although allergy listed to nitrofurantoin in chart.

## 2014-03-24 NOTE — Telephone Encounter (Signed)
Patient Information:  Caller Name: Mishell  Phone: [phone]  Patient: Debra Little, Debra Little  Gender: Female  DOB: 06/25/1956  Age: 58 Years  PCP: Elsie Stain Brigitte Pulse) [facility])  Pregnant: No  Office Follow Up:  Does the office need to follow up with this patient?: Yes  Instructions For The Office: Requesting antibiotic. Asking should she keep appt scheduled with Dr. Damita Dunnings  on 03/25/14 @ 18:15.   Symptoms  Reason For Call & Symptoms: Ashliegh states she  took a urine specimen to a urologist on 03/20/14 for burning and discomfort with urination.. Was ordered Macrobid for UTI on 03/20/14. States she onset of pain in epigastric region and increased acid reflux on 03/20/14 after starting Macrobid. States she took one day of 10 days of Macrobid and stopped Macrobid due to epigastric pain and reflux.States she notified urologist of side effects on 03/23/14 and was told nothing else can be given. Was advised by urology to notify PCP. Ben states she " just remembered during this call that she took name brand Macrobid a long time ago and did not have a problem." Tekla states she is taking Uribel one tab po BID.  Cont to have burning and discomfort with urination. Rates discomfort 3 on 1/10 pt scale. Per urination with pain protocol has see today in office due to having > 2 UTI's in last year. No appts left in [facility]. States she has an appointment with Dr. Damita Dunnings on 03/25/14 at 18:15. Asking can another medication be called in to [facility] [phone]? Asking should she keep appointment on 03/25/14? Please call [place] at [phone].  Reviewed Health History In EMR: Yes  Reviewed Medications In EMR: Yes  Reviewed Allergies In EMR: Yes  Reviewed Surgeries / Procedures: Yes  Date of Onset of Symptoms: 03/17/2014  Treatments Tried: Macrobid, Uribel  Treatments Tried Worked: No OB / GYN:  LMP: Unknown  Guideline(s) Used:  Urination Pain - Female  Disposition Per  Guideline:   See Today in Office  Reason For Disposition Reached:   > 2 UTIs in last year  Advice Given:  N/A  Patient Will Follow Care Advice:  YES

## 2014-03-24 NOTE — Telephone Encounter (Signed)
Patient notified and Rx called in as directed. 

## 2014-03-25 ENCOUNTER — Ambulatory Visit: Payer: Self-pay | Admitting: Family Medicine

## 2014-03-30 ENCOUNTER — Ambulatory Visit (INDEPENDENT_AMBULATORY_CARE_PROVIDER_SITE_OTHER): Payer: BC Managed Care – PPO | Admitting: Family Medicine

## 2014-03-30 ENCOUNTER — Encounter: Payer: Self-pay | Admitting: Family Medicine

## 2014-03-30 VITALS — BP 128/70 | HR 96 | Temp 98.0°F | Wt 153.5 lb

## 2014-03-30 DIAGNOSIS — N3010 Interstitial cystitis (chronic) without hematuria: Secondary | ICD-10-CM

## 2014-03-30 DIAGNOSIS — R300 Dysuria: Secondary | ICD-10-CM

## 2014-03-30 LAB — POCT URINALYSIS DIPSTICK
Bilirubin, UA: NEGATIVE
Blood, UA: NEGATIVE
Glucose, UA: NEGATIVE
Ketones, UA: NEGATIVE
Leukocytes, UA: NEGATIVE
Nitrite, UA: NEGATIVE
Protein, UA: NEGATIVE
Spec Grav, UA: <=1.005
Urobilinogen, UA: NEGATIVE
pH, UA: 6

## 2014-03-30 NOTE — Patient Instructions (Signed)
Talk to GI and cardiology about follow up.  We'll contact you with your lab report. If needed, take another/extra prilosec in the meantime.

## 2014-03-30 NOTE — Progress Notes (Signed)
Pre visit review using our clinic review tool, if applicable. No additional management support is needed unless otherwise documented below in the visit note.  Reportedly, prev seen by uro and started on macrobid.  Notes not available at this point.  H/o IC.  H/o having cipro per uro "to keep on hand."  Per patient the only sensitive med was macrobid.  She was changed another distributor's production of macrobid in the meantime.  Now with more heartburn in the meantime.  Ginger ale and TUMS helped some.    She had seen GI [name]) about her hiatal hernia.  She is taking prilosec daily already.    She has no abd pain or burning with urination now.  She is off abx currently.   Meds, vitals, and allergies reviewed.   ROS: See HPI.  Otherwise, noncontributory.  nad ncat Mmm rrr ctab abd soft, not ttp Suprapubic area not ttp Ext w/o edema

## 2014-03-31 DIAGNOSIS — N3010 Interstitial cystitis (chronic) without hematuria: Secondary | ICD-10-CM | POA: Insufficient documentation

## 2014-03-31 DIAGNOSIS — R300 Dysuria: Secondary | ICD-10-CM | POA: Insufficient documentation

## 2014-03-31 NOTE — Assessment & Plan Note (Signed)
Now resolved, requesting uro recs.  Would have her f/u with GI re: abd pain that sounds to be GERD/med related.  Can use BID PPI prn in the meantime.  Benign exam today.  Check ucx, pending.

## 2014-04-01 LAB — URINE CULTURE: Colony Count: 60000

## 2014-04-02 ENCOUNTER — Other Ambulatory Visit: Payer: BC Managed Care – PPO

## 2014-04-02 ENCOUNTER — Encounter: Payer: Self-pay | Admitting: General Surgery

## 2014-04-02 ENCOUNTER — Ambulatory Visit (INDEPENDENT_AMBULATORY_CARE_PROVIDER_SITE_OTHER): Payer: BC Managed Care – PPO | Admitting: General Surgery

## 2014-04-02 VITALS — BP 120/72 | HR 76 | Resp 12 | Ht 67.0 in | Wt 152.0 lb

## 2014-04-02 DIAGNOSIS — N6009 Solitary cyst of unspecified breast: Secondary | ICD-10-CM

## 2014-04-02 DIAGNOSIS — N63 Unspecified lump in unspecified breast: Secondary | ICD-10-CM

## 2014-04-02 DIAGNOSIS — N644 Mastodynia: Secondary | ICD-10-CM

## 2014-04-02 NOTE — Patient Instructions (Signed)
Follow up appointment to be announced.  

## 2014-04-02 NOTE — Progress Notes (Addendum)
Patient ID: Debra Little, female   DOB: 1956-04-06, 58 y.o.   MRN: [id number]  Chief Complaint  Patient presents with  . Follow-up    breast lumps    HPI Debra Little is a 57 y.o. female here today for a evaluation of right breast lumps. She noticed them February 2015. Patient states she has pain in her left breast been going on for about one year. Patient last Mammogram was on 01/11/14 at [place]. Patient does not perform self breast checks. Patient states she fell 2 months ago and landed on her knees. No breast trauma during this event.   The patient was last seen in this office in January 2014. She previously undergone several biopsies of both breasts. Stromal fibrosis had been identified and should undergone cyst aspirations as well. Her previous screening mammogram at that visit 11/13/2012 had scribed involutional changes as well as rounded, nodular densities unchanged from past exams.  The patient's more recent mammograms completed both locally and in [place], the latter study was a tomosynthesis exam.     HPI  Past Medical History  Diagnosis Date  . IBS (irritable bowel syndrome)     constipation predominant  . Hiatal hernia   . Cyst of breast     per [name]  . Vitamin D deficiency     history of  . Anxiety     sees Dr. [name]  . MVP (mitral valve prolapse)     [facility] cards eval 2012  . Fibromyalgia   . IC (interstitial cystitis)     per [place] [facility]  . Hemorrhoids   . Hyperlipidemia     Past Surgical History  Procedure Laterality Date  . Excision of breast cysts      hx of multiple cyst aspirations  . Abdominal hysterectomy    . Exploratory laparotomy    . Tubal ligation      Family History  Problem Relation Age of Onset  . Cancer Mother     breast  . Cancer Father     colon    Social History History  Substance Use Topics  . Smoking status: Never Smoker   . Smokeless tobacco: Never Used  . Alcohol Use: No     Allergies  Allergen Reactions  . Gabapentin     drowsy  . Bentyl [Dicyclomine Hcl]     Lack of effect  . Celecoxib     REACTION: unspecified  . Doxycycline     Palpitations and chest pain  . Mobic [Meloxicam]     Palpitations.   . Nitrofurantoin Monohyd Macro   . Penicillins     REACTION: rash  . Sulfonamide Derivatives     REACTION: nausea  . Zoloft [Sertraline Hcl]     nausea and diarrhea.       Current Outpatient Prescriptions  Medication Sig Dispense Refill  . cholecalciferol (VITAMIN D) 400 UNITS TABS tablet Take 400 Units by mouth.      . clorazepate (TRANXENE-T) 7.5 MG tablet Take 7.5 mg by mouth 3 (three) times daily as needed.       . gabapentin (NEURONTIN) 100 MG capsule Take 100 mg by mouth 3 (three) times daily.       [name] imipramine (TOFRANIL) 10 MG tablet Take 30 mg by mouth daily.       . Meth-Hyo-M Bl-Na Phos-Ph Sal (URIBEL) 118 MG CAPS       . omeprazole (PRILOSEC) 20 MG capsule Take 20 mg by mouth 2 (two)  times daily before a meal. BRAND NAME ONLY      . progesterone (PROMETRIUM) 100 MG capsule Take 100 mg by mouth daily.      . traMADol (ULTRAM) 50 MG tablet Take 1 tablet (50 mg total) by mouth every 8 (eight) hours as needed for severe pain.  30 tablet  0  . benzonatate (TESSALON) 200 MG capsule Take 1 capsule (200 mg total) by mouth 3 (three) times daily as needed for cough.  20 capsule  0  . fluticasone (FLONASE) 50 MCG/ACT nasal spray Place 2 sprays into both nostrils daily.  16 g  1  . loratadine (CLARITIN) 10 MG tablet Take 1 tablet (10 mg total) by mouth daily.       No current facility-administered medications for this visit.    Review of Systems Review of Systems  Constitutional: Negative.   Respiratory: Negative.   Cardiovascular: Negative.     Blood pressure 120/72, pulse 76, resp. rate 12, height 5\' 7"  (1.702 m), weight 152 lb (68.947 kg).  Physical Exam Physical Exam  Constitutional: She is oriented to person, place, and time. She  appears well-developed and well-nourished.  Eyes: Conjunctivae are normal.  Neck: Neck supple.  Cardiovascular: Normal rate, regular rhythm and normal heart sounds.   Pulmonary/Chest: Effort normal and breath sounds normal. Right breast exhibits no inverted nipple, no mass, no nipple discharge, no skin change and no tenderness. Left breast exhibits no inverted nipple, no mass, no nipple discharge, no skin change and no tenderness.  Well healed incision upper left quadrant and second incision from 12 to 3 o'clock around the areolar left breast.Muscule tenderness left laterally left breast. Right breast 3 o'clock  1 cm from nipple  And 6 o'clock and 4 cm from nipple.  Abdominal: Soft. Bowel sounds are normal. There is no tenderness.  Lymphadenopathy:    She has no cervical adenopathy.    She has no axillary adenopathy.  Neurological: She is alert and oriented to person, place, and time.  Skin: Skin is warm and dry.    Data Reviewed The patient had previously undergone a left breast mass excision in 07/04/1985. His were reported as mammary dysplasia (predominantly fibrous dysplasia. Limited pericanalicular fibroadenomatous changes were appreciated. It was felt to be most consistent with fibrocystic disease. In February of 1994 the patient underwent the biopsy of the left breast with identification of a fibroadenoma.  Previous breast cyst aspiration of the left completed in 2010 showed no evidence of malignancy.  In October 2014 the patient underwent core biopsy of 2 areas in the left breast at the 12 and 2:00 position. One showed stromal fibrosis and the second an organizing hematoma with ductal hyperplasia without atypia.  February 2015 mammogram results: CLINICAL DATA: Screening.  EXAM:  DIGITAL SCREENING BILATERAL MAMMOGRAM WITH 3D TOMO WITH CAD  DIGITAL BREAST TOMOSYNTHESIS  Digital breast tomosynthesis images are acquired in two projections.  These images are reviewed in combination  with the digital mammogram,  confirming the findings below.  COMPARISON: Previous exam(s).  ACR Breast Density Category c: The breast tissue is heterogeneously  dense, which may obscure small masses.  FINDINGS:  There are no findings suspicious for malignancy. Images were  processed with CAD.  IMPRESSION:  No mammographic evidence of malignancy. A result letter of this  screening mammogram will be mailed directly to the patient.  RECOMMENDATION:  Screening mammogram in one year. (Code:SM-B-01Y)  BI-RADS CATEGORY 1: Negative.  Electronically Signed  By: Luberta Robertson M.D.  On:  01/05/2014 15:26   Ultrasound examination of the right breast in the 3:00 position 8 cm from the nipple in an area of focal thickening showed an ill-defined lobulated hypoechoic area with focal posterior acoustic shadowing measuring 0.5 x 0.5 x 0.7 cm. This is an indeterminate lesion, BI-RADS 3.  In the right breast at the 5:00 position 4 cm from the nipple a well-defined softly lobulated cystic mass was smooth borders, good posterior acoustic enhancement and evidence of Brownie and motion was appreciated. At the 7:00 position a similar lesion measuring 1.8 x 2.1 x 2.2 cm was identified. Immediately adjacent to this was a similar area. The patient was amenable to aspiration of the 7:00 lesion and this was completed with complete resolution.  Fine-needle aspiration cytology of the lesion at the 3-4:00 position of the right breast, 8 cm the nipple was also completed with slides x4 prepared for cytologic review.  Assessment    Multiple breast cysts.  Numerous previous biopsies for benign disease.  Indeterminate lesion right breast 3:00 position.  Family history of breast cancer.      Plan    The patient will be contacted when the cytology results are available.  Based on her family history, birth history, and previous biopsy results (discounting the reported dysplasia in 1986 exam) the Baker Janus model risk is 3.4%  for 5 year the vent with breast cancer, 21.8% lifetime risk. She does meet criteria for screening MRI by most current standards.  The patient has very dense breasts, and even with tomosynthesis areas of concern could be missed.  With her history of anxiety, depression,-year-old bowel syndrome and interstitial cystitis, not sure if she'll tolerate chemoprevention.      PCP: Ebony Cargo Byrnett 04/22/2014, 8:50 AM

## 2014-04-05 DIAGNOSIS — N6009 Solitary cyst of unspecified breast: Secondary | ICD-10-CM | POA: Insufficient documentation

## 2014-04-05 DIAGNOSIS — N644 Mastodynia: Secondary | ICD-10-CM | POA: Insufficient documentation

## 2014-04-05 DIAGNOSIS — N63 Unspecified lump in unspecified breast: Secondary | ICD-10-CM | POA: Insufficient documentation

## 2014-04-07 LAB — FINE-NEEDLE ASPIRATION

## 2014-04-08 ENCOUNTER — Telehealth: Payer: Self-pay | Admitting: *Deleted

## 2014-04-08 NOTE — Telephone Encounter (Signed)
Notified patient as instructed, patient pleased. Discussed follow-up appointments, patient agrees. Placed in recalls.  

## 2014-04-08 NOTE — Telephone Encounter (Signed)
Message copied by Carson Myrtle on Wed Apr 08, 2014  2:36 PM ------      Message from: [place], [place]      Created: Wed Apr 08, 2014  1:53 PM       Please notify the patient the cytology report did NOT show any evidence of cancer or abnormal cells.  Would recommend repeat ultrasound and office exam in six months. While biopsy (vacuum) could be completed, I would not recommend to a family member.      ----- Message -----         From: [facility] Lab Results In Interface         Sent: 04/07/2014   4:39 PM           To: Robert Bellow, MD                   ------

## 2014-04-08 NOTE — Telephone Encounter (Signed)
Patient called asking about cytology results?

## 2014-04-10 ENCOUNTER — Ambulatory Visit (INDEPENDENT_AMBULATORY_CARE_PROVIDER_SITE_OTHER): Payer: BC Managed Care – PPO | Admitting: Family Medicine

## 2014-04-10 ENCOUNTER — Encounter: Payer: Self-pay | Admitting: Family Medicine

## 2014-04-10 VITALS — BP 140/78 | HR 87 | Temp 98.0°F | Wt 154.5 lb

## 2014-04-10 DIAGNOSIS — R05 Cough: Secondary | ICD-10-CM

## 2014-04-10 DIAGNOSIS — R059 Cough, unspecified: Secondary | ICD-10-CM | POA: Insufficient documentation

## 2014-04-10 MED ORDER — FLUTICASONE PROPIONATE 50 MCG/ACT NA SUSP: 2.0000 | Freq: Every day | NASAL | Status: DC

## 2014-04-10 MED ORDER — LORATADINE 10 MG PO TABS: 10.0000 mg | ORAL_TABLET | Freq: Every day | ORAL | Status: AC

## 2014-04-10 NOTE — Progress Notes (Signed)
Pre visit review using our clinic review tool, if applicable. No additional management support is needed unless otherwise documented below in the visit note.  Sx for 1 week. Noted ST initially, then post nasal gtt.  Cough.  Aches. No known fevers.  Rhinorrhea and stuffy.  Had to use afrin occ.  Eye watering. The cough is most bothersome for her. Some post-tussive emesis recently.  Last 3 nights she has had to sleep in a recliner to get more comfortable/dec the cough.  Taking mucinex and tussin DM cough syrup, w/o much relief.   Meds, vitals, and allergies reviewed.   ROS: See HPI.  Otherwise, noncontributory.  GEN: nad, alert and oriented HEENT: mucous membranes moist, tm w/o erythema, nasal exam w/o erythema, clear discharge noted,  OP with cobblestoning, sinuses not ttp x4 NECK: supple w/o LA CV: rrr.   PULM: ctab, no inc wob EXT: no edema

## 2014-04-10 NOTE — Patient Instructions (Addendum)
Take claritin 10mg  a day and use the flonase daily. Take plain mucinex (not -D) with plenty of fluid.  This should help some in the meantime.  Take care. Try to get some rest.

## 2014-04-10 NOTE — Assessment & Plan Note (Signed)
Likely either from seasonal allergic sx or a viral process.  Okay for outpatient f/u.  No indication for abx at this point.  D/w pt.  Take claritin 10mg  a day with flonase daily.  Take plain mucinex (not -D) with plenty of fluid.  F/u prn.

## 2014-04-15 ENCOUNTER — Telehealth: Payer: Self-pay | Admitting: Family Medicine

## 2014-04-15 MED ORDER — BENZONATATE 200 MG PO CAPS: 200.0000 mg | ORAL_CAPSULE | Freq: Three times a day (TID) | ORAL | Status: DC | PRN

## 2014-04-15 NOTE — Telephone Encounter (Signed)
Can try tessalon.  If sig worsening, then f/u here in the office.  Thanks.

## 2014-04-15 NOTE — Telephone Encounter (Signed)
Patient notified as instructed by telephone. Patient stated that she will try the tessalon and call back tomorrow for an appointment if they do not help.

## 2014-04-15 NOTE — Telephone Encounter (Signed)
Patient Information:  Caller Name: [place]  Phone: [phone]  Patient: Debra Little, Debra Little  Gender: Female  DOB: 02-Jun-1956  Age: 58 Years  PCP: Elsie Stain Brigitte Pulse) [facility])  Pregnant: No  Office Follow Up:  Does the office need to follow up with this patient?: Yes  Instructions For The Office: Please review.  Contact patient at  [phone].  RN Note:  Would like Rx for cough called to [facility]  [phone]   since going out of town on Friday 5/22.  Symptoms  Reason For Call & Symptoms: Started with runny nose, sore throat, some sneezing then cough about 5/8.  Seen in office on Friday 5/15  Dx allergies - started nasal spray, Claritin.  Cough has increased and is worse especially at night.  Cough dry, has coughed up small amount of green mucus in mornings.  Feels like cough is pulling her down, sometimes coughing until turning red and gagging.  Reviewed Health History In EMR: Yes  Reviewed Medications In EMR: Yes  Reviewed Allergies In EMR: Yes  Reviewed Surgeries / Procedures: Yes  Date of Onset of Symptoms: 04/03/2014  Treatments Tried: Gone thru whole bag of cough drops, taking warm liquids.  Treatments Tried Worked: No OB / GYN:  LMP: Unknown  Guideline(s) Used:  Cough  Disposition Per Guideline:   See Within 3 Days in Office  Reason For Disposition Reached:   Cough has been present for > 10 days  Advice Given:  N/A  Patient Refused Recommendation:  Patient Requests Prescription  Says will try to come in if Dr Damita Dunnings particularily requests it. Can reach patient at  [phone].

## 2014-04-23 ENCOUNTER — Telehealth: Payer: Self-pay | Admitting: Family Medicine

## 2014-04-23 NOTE — Telephone Encounter (Signed)
Patient: Debra Little,Debra Little; DOB: May 31, 1956; PCP: Elsie Stain Brigitte Pulse) - Patient thinks she has sores on her mouth and it hurts. Also irritation when she swallows. Thinks she has thrush.  Did not want Triage.  Wants to see if Dr. Damita Dunnings will call her in a prescription.

## 2014-04-24 NOTE — Telephone Encounter (Signed)
Patient advised.

## 2014-04-24 NOTE — Telephone Encounter (Signed)
She needs to be seen.

## 2014-04-30 DIAGNOSIS — I341 Nonrheumatic mitral (valve) prolapse: Secondary | ICD-10-CM | POA: Insufficient documentation

## 2014-05-01 ENCOUNTER — Ambulatory Visit (INDEPENDENT_AMBULATORY_CARE_PROVIDER_SITE_OTHER): Payer: BC Managed Care – PPO | Admitting: Family Medicine

## 2014-05-01 ENCOUNTER — Encounter: Payer: Self-pay | Admitting: Family Medicine

## 2014-05-01 VITALS — BP 122/60 | HR 88 | Temp 98.1°F | Wt 155.5 lb

## 2014-05-01 DIAGNOSIS — R21 Rash and other nonspecific skin eruption: Secondary | ICD-10-CM

## 2014-05-01 MED ORDER — TRIAMCINOLONE ACETONIDE 0.1 % EX CREA: 1.0000 "application " | TOPICAL_CREAM | Freq: Two times a day (BID) | CUTANEOUS | Status: DC

## 2014-05-01 NOTE — Progress Notes (Signed)
   BP 122/60  Pulse 88  Temp(Src) 98.1 F (36.7 C) (Oral)  Wt 155 lb 8 oz (70.534 kg)   CC: shingles?  Subjective:    Patient ID: Debra Little, female    DOB: January 14, 1956, 58 y.o.   MRN: [id number]  HPI: Debra Little is a 58 y.o. female presenting on 05/01/2014 for Redness   5d ago noticed "breakout" on back. Since then, noticing shoulder aching. Then noticed red spot on R posterior shoulder that has been itchy and tender. No numbness or tingling. Does have chronic R shoulder pain 2/2 fibromyalgia Went to beach last week, returned on Tuesday. Has been using OTC cortisone cream which may have helped but it is persisting.  H/o shingles at age 81yo treated with antiviral (posterior R leg).  Relevant past medical, surgical, family and social history reviewed and updated as indicated.  Allergies and medications reviewed and updated. Current Outpatient Prescriptions on File Prior to Visit  Medication Sig  . benzonatate (TESSALON) 200 MG capsule Take 1 capsule (200 mg total) by mouth 3 (three) times daily as needed for cough.  . cholecalciferol (VITAMIN D) 400 UNITS TABS tablet Take 400 Units by mouth.  . clorazepate (TRANXENE-T) 7.5 MG tablet Take 7.5 mg by mouth 3 (three) times daily as needed.   . fluticasone (FLONASE) 50 MCG/ACT nasal spray Place 2 sprays into both nostrils daily.  [name] gabapentin (NEURONTIN) 100 MG capsule Take 100 mg by mouth 3 (three) times daily.   [name] imipramine (TOFRANIL) 10 MG tablet Take 30 mg by mouth daily.   [name] loratadine (CLARITIN) 10 MG tablet Take 1 tablet (10 mg total) by mouth daily.  . Meth-Hyo-M Bl-Na Phos-Ph Sal (URIBEL) 118 MG CAPS   . omeprazole (PRILOSEC) 20 MG capsule Take 20 mg by mouth 2 (two) times daily before a meal. BRAND NAME ONLY  . progesterone (PROMETRIUM) 100 MG capsule Take 100 mg by mouth daily.  . traMADol (ULTRAM) 50 MG tablet Take 1 tablet (50 mg total) by mouth every 8 (eight) hours as needed for severe pain.   No current  facility-administered medications on file prior to visit.    Review of Systems Per HPI unless specifically indicated above    Objective:    BP 122/60  Pulse 88  Temp(Src) 98.1 F (36.7 C) (Oral)  Wt 155 lb 8 oz (70.534 kg)  Physical Exam  Nursing note and vitals reviewed. Constitutional: She appears well-developed and well-nourished. No distress.  Skin: Rash noted. There is erythema.  Erythematous raised patch R posterior shoulder about 4cm diameter - pruritic and mildly tender. Non vesicular.       Assessment & Plan:   Problem List Items Addressed This Visit   Skin rash - Primary     Anticipate skin reaction after bug bite sustained at beach. Reassured not consistent with shingles rash - rec treat with TCI cream bid for next 5 days and update [place] if not improving as expected.        Follow up plan: Return if symptoms worsen or fail to improve.

## 2014-05-01 NOTE — Progress Notes (Signed)
Pre visit review using our clinic review tool, if applicable. No additional management support is needed unless otherwise documented below in the visit note. 

## 2014-05-01 NOTE — Patient Instructions (Signed)
I don't think this is shingles. Treat with triamcinolone cream twice daily for next week and let us know if not improving as expected. Good to see you today, call us with questions.

## 2014-05-01 NOTE — Assessment & Plan Note (Signed)
Anticipate skin reaction after bug bite sustained at beach. Reassured not consistent with shingles rash - rec treat with TCI cream bid for next 5 days and update [place] if not improving as expected.

## 2014-05-19 ENCOUNTER — Encounter: Payer: Self-pay | Admitting: General Surgery

## 2014-05-25 ENCOUNTER — Ambulatory Visit: Payer: Self-pay | Admitting: Unknown Physician Specialty

## 2014-06-29 ENCOUNTER — Ambulatory Visit: Payer: Self-pay | Admitting: Unknown Physician Specialty

## 2014-08-26 ENCOUNTER — Telehealth: Payer: Self-pay | Admitting: Family Medicine

## 2014-08-26 NOTE — Telephone Encounter (Signed)
If she is going to start the cipro today, then we don't need to get a u/a or ucx.  We can see her tomorrow if needed.  I will defer to patient/urology in the meantime.

## 2014-08-26 NOTE — Telephone Encounter (Signed)
Noted, thanks!

## 2014-08-26 NOTE — Telephone Encounter (Signed)
Patient Information:  Caller Name: Debra Little  Phone: [phone]  Patient: Debra, Little  Gender: Female  DOB: 1955-12-30  Age: 59 Years  PCP: Elsie Stain Brigitte Pulse) [facility])  Office Follow Up:  Does the office need to follow up with this patient?: Yes  Instructions For The Office: Please call and advise.   Symptoms  Reason For Call & Symptoms: Pt is having pelvic; low back and leg pain (at the top of her thighs). Pt called for an appt and was given one for tomorrow. Onset 3 days ago. Her urine has a bit of an odor and she is having frequency. Pt has a hx of interstitial cystitis. Pt denies any burning or sense of urgency. Pt has Cipro 250mg  BID from her urologist at  home that she is going to go ahead and take. Her urologist has given her this script to have on hand.  Reviewed Health History In EMR: Yes  Reviewed Medications In EMR: Yes  Reviewed Allergies In EMR: Yes  Reviewed Surgeries / Procedures: Yes  Date of Onset of Symptoms: 08/23/2014  Guideline(s) Used:  Urination Pain - Female  Disposition Per Guideline:   Go to Office Now  Reason For Disposition Reached:   Side (flank) or lower back pain present  Advice Given:  N/A  RN Overrode Recommendation:  Follow Up With Office Later  Pt wanted MD to know she was starting the antibiotic. Could she run over a urine specimen today based on her history?

## 2014-08-26 NOTE — Telephone Encounter (Signed)
Left message on voice mail  to call back

## 2014-08-26 NOTE — Telephone Encounter (Signed)
Patient notified as instructed by telephone. Was advised by patient that she has already started the Cipro. Patient stated the reason she called here instead of her urologist is because her co-pay is cheaper. Patient requested that her appointment be cancelled for tomorrow and she will call back if symptoms do not improve with the Cipro. Appointment cancelled per patient's request.

## 2014-08-27 ENCOUNTER — Ambulatory Visit: Payer: Self-pay | Admitting: Family Medicine

## 2014-09-27 HISTORY — PX: COLONOSCOPY: SHX174

## 2014-09-28 ENCOUNTER — Encounter: Payer: Self-pay | Admitting: Family Medicine

## 2014-09-29 LAB — HM COLONOSCOPY

## 2014-10-06 ENCOUNTER — Ambulatory Visit (INDEPENDENT_AMBULATORY_CARE_PROVIDER_SITE_OTHER): Payer: BC Managed Care – PPO | Admitting: General Surgery

## 2014-10-06 ENCOUNTER — Encounter: Payer: Self-pay | Admitting: General Surgery

## 2014-10-06 ENCOUNTER — Ambulatory Visit: Payer: BC Managed Care – PPO

## 2014-10-06 VITALS — BP 100/70 | HR 80 | Resp 14 | Ht 67.0 in | Wt 153.0 lb

## 2014-10-06 DIAGNOSIS — N63 Unspecified lump in unspecified breast: Secondary | ICD-10-CM

## 2014-10-06 DIAGNOSIS — N6009 Solitary cyst of unspecified breast: Secondary | ICD-10-CM

## 2014-10-06 NOTE — Patient Instructions (Addendum)
Continue self breast exams. Call office for any new breast issues or concerns. Patient to return in 1 year for follow up after mammogram is done.

## 2014-10-06 NOTE — Progress Notes (Signed)
Patient ID: Debra Little, female   DOB: 1956-10-14, 58 y.o.   MRN: [id number]  Chief Complaint  Patient presents with  . Follow-up    6 month follow up right breast ultrasound     HPI Debra Little is a 58 y.o. female who presents for a 6 month right breast follow up. The patient is to have a right breast ultrasound performed at this visit. She denies any new problems at this time.   HPI  Past Medical History  Diagnosis Date  . IBS (irritable bowel syndrome)     constipation predominant  . Hiatal hernia   . Cyst of breast     per [name]  . Vitamin D deficiency     history of  . Anxiety     sees Dr. [name]  . MVP (mitral valve prolapse)     [facility] cards eval 2012  . Fibromyalgia   . IC (interstitial cystitis)     per [place] [facility]  . Hemorrhoids   . Hyperlipidemia     Past Surgical History  Procedure Laterality Date  . Excision of breast cysts      hx of multiple cyst aspirations  . Abdominal hysterectomy    . Exploratory laparotomy    . Tubal ligation    . Colonoscopy  09/2014    Dr. [name]    Family History  Problem Relation Age of Onset  . Cancer Mother     breast  . Cancer Father     colon    Social History History  Substance Use Topics  . Smoking status: Never Smoker   . Smokeless tobacco: Never Used  . Alcohol Use: No    Allergies  Allergen Reactions  . Gabapentin     drowsy  . Bentyl [Dicyclomine Hcl]     Lack of effect  . Celecoxib     REACTION: unspecified  . Cortisone Other (See Comments)    flush  . Doxycycline     Palpitations and chest pain  . Mobic [Meloxicam]     Palpitations.   . Nitrofurantoin Monohyd Macro   . Penicillins     REACTION: rash  . Sulfonamide Derivatives     REACTION: nausea  . Zoloft [Sertraline Hcl]     nausea and diarrhea.       Current Outpatient Prescriptions  Medication Sig Dispense Refill  . benzonatate (TESSALON) 200 MG capsule Take 1 capsule (200 mg total) by mouth 3 (three)  times daily as needed for cough. 20 capsule 0  . clorazepate (TRANXENE-T) 7.5 MG tablet Take 7.5 mg by mouth 3 (three) times daily as needed.     . fluticasone (FLONASE) 50 MCG/ACT nasal spray Place 2 sprays into both nostrils daily. (Patient taking differently: Place 2 sprays into both nostrils daily as needed. ) 16 g 1  . gabapentin (NEURONTIN) 100 MG capsule Take 100 mg by mouth 3 (three) times daily.     [name] imipramine (TOFRANIL) 10 MG tablet Take 30 mg by mouth daily.     [name] loratadine (CLARITIN) 10 MG tablet Take 1 tablet (10 mg total) by mouth daily. (Patient taking differently: Take 10 mg by mouth daily as needed. )    . Meth-Hyo-M Bl-Na Phos-Ph Sal (URIBEL) 118 MG CAPS     . omeprazole (PRILOSEC) 20 MG capsule Take 20 mg by mouth 2 (two) times daily before a meal. BRAND NAME ONLY    . progesterone (PROMETRIUM) 100 MG capsule Take 100 mg by mouth  daily.    . traMADol (ULTRAM) 50 MG tablet Take 1 tablet (50 mg total) by mouth every 8 (eight) hours as needed for severe pain. 30 tablet 0   No current facility-administered medications for this visit.    Review of Systems Review of Systems  Constitutional: Negative.   Respiratory: Negative.   Cardiovascular: Negative.     Blood pressure 100/70, pulse 80, resp. rate 14, height 5\' 7"  (1.702 m), weight 153 lb (69.4 kg).  Physical Exam Physical Exam  Constitutional: She is oriented to person, place, and time. She appears well-developed and well-nourished.  Neck: Neck supple. No thyromegaly present.  Cardiovascular: Normal rate, regular rhythm and normal heart sounds.   No murmur heard. Pulmonary/Chest: Effort normal and breath sounds normal. Right breast exhibits tenderness (mild tenderness). Right breast exhibits no inverted nipple, no mass, no nipple discharge and no skin change. Left breast exhibits tenderness (mild tenderness). Left breast exhibits no inverted nipple, no mass, no nipple discharge and no skin change.  Little thickening  at 1 o'clock as well as 6 o'clock right breast.  Lymphadenopathy:    She has no cervical adenopathy.    She has no axillary adenopathy.  Neurological: She is alert and oriented to person, place, and time.  Skin: Skin is warm and dry.    Data Reviewed Ultrasound examination of the right breast was undertaken to evaluate the multiple areas of nodular tissue previously identified.  In the 1:00 position 3 cm from the nipple, an area of clinical thickening a simple cyst measuring 0.9 x 1.1 x 1.3 cm it is identified. Adjacent to this are multiple smaller cysts measuring less than 4 mm in diameter and an aggregate 0.7 cm in diameter. The previously identified hypoechoic area at the 3:00 position of the right breast, 7 cm from the nipple is smaller measuring 0.3 x 0.3 x 0.46 cm. At the 4:00 position, 4 cm from the nipple in previously identified cystic structure with strong acoustic enhancement and sharp edge effect measuring 1.3 x 1.4 x 1.6 cm is noted. Brownian motion is identified within the cyst fluid. At the 7:00 position a simple cyst measuring less than 0.7 is identified as well as a small hypoechoic area adjacent this measuring less than 0.7 cm. These areas are unchanged. In the 8:00 position a 0.5 x 0.6 x 0.8 cm simple cyst is identified 2 cm from the nipple.  Assessment    Multiple breast cysts.     Plan    The patient's last mammogram was scheduled by her PCP. This is due again in January 2016. We'll plan to have the patient return in one year for clinical exam and ultrasound if necessary. She's been encouraged to call me interval if she has any concerns.   The patient is at a slight increased risk of breast malignancy based on her multiple previous procedures. Baker Janus model risk is 3.7%. Benefits of antiestrogen therapy were reviewed, side effects would likely exacerbate the profound hot flashes she is presently experiencing. At this time chemoprevention has not felt warranted.    PCP/Ref  MD:  Azucena Freed 10/06/2014, 8:46 PM

## 2014-12-14 ENCOUNTER — Ambulatory Visit: Payer: Self-pay | Admitting: Family Medicine

## 2014-12-16 ENCOUNTER — Ambulatory Visit: Payer: Self-pay | Admitting: Family Medicine

## 2014-12-21 ENCOUNTER — Ambulatory Visit (INDEPENDENT_AMBULATORY_CARE_PROVIDER_SITE_OTHER): Payer: BLUE CROSS/BLUE SHIELD | Admitting: Family Medicine

## 2014-12-21 ENCOUNTER — Other Ambulatory Visit: Payer: Self-pay | Admitting: Family Medicine

## 2014-12-21 ENCOUNTER — Encounter: Payer: Self-pay | Admitting: Family Medicine

## 2014-12-21 VITALS — BP 136/80 | HR 84 | Temp 98.4°F | Wt 155.8 lb

## 2014-12-21 DIAGNOSIS — N644 Mastodynia: Secondary | ICD-10-CM

## 2014-12-21 DIAGNOSIS — F329 Major depressive disorder, single episode, unspecified: Secondary | ICD-10-CM

## 2014-12-21 DIAGNOSIS — F32A Depression, unspecified: Secondary | ICD-10-CM

## 2014-12-21 DIAGNOSIS — F418 Other specified anxiety disorders: Secondary | ICD-10-CM

## 2014-12-21 DIAGNOSIS — F419 Anxiety disorder, unspecified: Secondary | ICD-10-CM

## 2014-12-21 NOTE — Patient Instructions (Signed)
Rosaria Ferries will call about your referral. See her on the way out.  Take care.

## 2014-12-21 NOTE — Progress Notes (Signed)
Pre visit review using our clinic review tool, if applicable. No additional management support is needed unless otherwise documented below in the visit note.  Some dec in ABD pain with sucralfate per GI.  She had colonoscopy 09/29/2014.   R breast pain.  Noted about 2 weeks ago.  Better now.  Prev with breast exam and multiple breast cysts on u/s per [name] 09/2014.   No new lumps, masses, discharge, or other breast findings.    Meds, vitals, and allergies reviewed.   ROS: See HPI.  Otherwise, noncontributory.  nad Chaperoned exam. B lateral breast tenderness w/o specific mass, no LA in the axilla or along clavicle B.  No nipple changes.  No rash or other skin changes.

## 2014-12-22 DIAGNOSIS — N644 Mastodynia: Secondary | ICD-10-CM | POA: Insufficient documentation

## 2014-12-22 NOTE — Assessment & Plan Note (Signed)
Refer for diag mammo. D/w pt.

## 2014-12-22 NOTE — Assessment & Plan Note (Signed)
Still seeing Dr. [name], wanted to see about restarting counseling. I asked her to check with Rosaria Ferries re: restart with Dr. Rexene Edison and patient will notify me if needing a referral.

## 2014-12-29 ENCOUNTER — Ambulatory Visit
Admission: RE | Admit: 2014-12-29 | Discharge: 2014-12-29 | Disposition: A | Discharge status: Home / Self Care | Payer: BLUE CROSS/BLUE SHIELD | Source: Ambulatory Visit | Attending: Family Medicine | Admitting: Family Medicine

## 2014-12-29 DIAGNOSIS — N644 Mastodynia: Secondary | ICD-10-CM

## 2015-03-04 ENCOUNTER — Ambulatory Visit (INDEPENDENT_AMBULATORY_CARE_PROVIDER_SITE_OTHER): Payer: BLUE CROSS/BLUE SHIELD | Admitting: Family Medicine

## 2015-03-04 ENCOUNTER — Encounter: Payer: Self-pay | Admitting: Family Medicine

## 2015-03-04 VITALS — BP 130/68 | HR 70 | Temp 98.3°F | Wt 156.0 lb

## 2015-03-04 DIAGNOSIS — R5383 Other fatigue: Secondary | ICD-10-CM | POA: Diagnosis not present

## 2015-03-04 DIAGNOSIS — R300 Dysuria: Secondary | ICD-10-CM

## 2015-03-04 LAB — POCT URINALYSIS DIPSTICK
Bilirubin, UA: NEGATIVE
Blood, UA: NEGATIVE
Glucose, UA: NEGATIVE
Ketones, UA: NEGATIVE
Leukocytes, UA: NEGATIVE
Nitrite, UA: NEGATIVE
Protein, UA: NEGATIVE
Spec Grav, UA: 1.015
Urobilinogen, UA: 4
pH, UA: 6

## 2015-03-04 NOTE — Patient Instructions (Signed)
Take uribel if needed over the next few days.   Go to the lab on the way out.  We'll contact you with your lab report. Keep the appointment with urology and [name].  Take care.

## 2015-03-04 NOTE — Progress Notes (Signed)
Pre visit review using our clinic review tool, if applicable. No additional management support is needed unless otherwise documented below in the visit note.  She is going to follow up with Dr. [name] in the meantime, that OV is pending.  She still has some dec in mood, with some crying spells.   She felt more sluggish and tired for the last few days, more than expected.  She has some external pain with urination recently, at the urethra.  She had some B back pain and muscle tightness.  She took one uribel last night before bed and that helped some, she slept better after that.    She has f/u with uro pending for Monday.    Meds, vitals, and allergies reviewed.   ROS: See HPI.  Otherwise, noncontributory.  nad ncat Mmm rrr ctab abd soft, not ttp, no rebound Back w/o CVA pain Ext w/o edema

## 2015-03-05 ENCOUNTER — Other Ambulatory Visit: Payer: Self-pay | Admitting: Family Medicine

## 2015-03-05 DIAGNOSIS — R5383 Other fatigue: Secondary | ICD-10-CM | POA: Insufficient documentation

## 2015-03-05 DIAGNOSIS — E538 Deficiency of other specified B group vitamins: Secondary | ICD-10-CM | POA: Insufficient documentation

## 2015-03-05 LAB — COMPREHENSIVE METABOLIC PANEL
ALT: 8 U/L (ref 0–35)
AST: 14 U/L (ref 0–37)
Albumin: 3.9 g/dL (ref 3.5–5.2)
Alkaline Phosphatase: 63 U/L (ref 39–117)
BUN: 15 mg/dL (ref 6–23)
CO2: 29 mEq/L (ref 19–32)
Calcium: 9.5 mg/dL (ref 8.4–10.5)
Chloride: 102 mEq/L (ref 96–112)
Creatinine, Ser: 0.87 mg/dL (ref 0.40–1.20)
GFR: 71.46 mL/min (ref >60.00)
Glucose, Bld: 89 mg/dL (ref 70–99)
Potassium: 4.1 mEq/L (ref 3.5–5.1)
Sodium: 138 mEq/L (ref 135–145)
Total Bilirubin: 0.2 mg/dL (ref 0.2–1.2)
Total Protein: 7.4 g/dL (ref 6.0–8.3)

## 2015-03-05 LAB — CBC WITH DIFFERENTIAL/PLATELET
Basophils Absolute: 0 10*3/uL (ref 0.0–0.1)
Basophils Relative: 0.3 % (ref 0.0–3.0)
Eosinophils Absolute: 0.3 10*3/uL (ref 0.0–0.7)
Eosinophils Relative: 2.7 % (ref 0.0–5.0)
HCT: 37.2 % (ref 36.0–46.0)
Hemoglobin: 12.6 g/dL (ref 12.0–15.0)
Lymphocytes Relative: 24.7 % (ref 12.0–46.0)
Lymphs Abs: 2.3 10*3/uL (ref 0.7–4.0)
MCHC: 34 g/dL (ref 30.0–36.0)
MCV: 82.5 fl (ref 78.0–100.0)
Monocytes Absolute: 0.7 10*3/uL (ref 0.1–1.0)
Monocytes Relative: 7 % (ref 3.0–12.0)
Neutro Abs: 6.2 10*3/uL (ref 1.4–7.7)
Neutrophils Relative %: 65.3 % (ref 43.0–77.0)
Platelets: 300 10*3/uL (ref 150.0–400.0)
RBC: 4.51 Mil/uL (ref 3.87–5.11)
RDW: 12.3 % (ref 11.5–15.5)
WBC: 9.5 10*3/uL (ref 4.0–10.5)

## 2015-03-05 LAB — VITAMIN B12: Vitamin B-12: 164 pg/mL — ABNORMAL LOW (ref 211–911)

## 2015-03-05 LAB — TSH: TSH: 2.13 u[IU]/mL (ref 0.35–4.50)

## 2015-03-05 NOTE — Assessment & Plan Note (Signed)
Normal u/a.  No sign of pyelo.  No fevers.  Check ucx.  Will update patient.  She agrees.  This could have been from [place] and not from a UTI.

## 2015-03-05 NOTE — Assessment & Plan Note (Addendum)
With depression likely contributing, but will check basic labs today.  D/w pt.  See notes on labs. >25 minutes spent in face to face time with patient, >50% spent in counselling or coordination of care.

## 2015-03-06 LAB — URINE CULTURE
Colony Count: NO GROWTH
Organism ID, Bacteria: NO GROWTH

## 2015-03-11 ENCOUNTER — Ambulatory Visit (INDEPENDENT_AMBULATORY_CARE_PROVIDER_SITE_OTHER): Payer: BLUE CROSS/BLUE SHIELD

## 2015-03-11 DIAGNOSIS — E538 Deficiency of other specified B group vitamins: Secondary | ICD-10-CM

## 2015-03-11 MED ORDER — CYANOCOBALAMIN 1000 MCG/ML IJ SOLN
1000.0000 ug | Freq: Once | INTRAMUSCULAR | Status: AC
  Administered 2015-03-11: 1000 ug via INTRAMUSCULAR

## 2015-04-13 ENCOUNTER — Ambulatory Visit: Payer: Self-pay

## 2015-04-15 ENCOUNTER — Ambulatory Visit (INDEPENDENT_AMBULATORY_CARE_PROVIDER_SITE_OTHER): Payer: BLUE CROSS/BLUE SHIELD | Admitting: *Deleted

## 2015-04-15 DIAGNOSIS — E538 Deficiency of other specified B group vitamins: Secondary | ICD-10-CM | POA: Diagnosis not present

## 2015-04-15 MED ORDER — CYANOCOBALAMIN 1000 MCG/ML IJ SOLN
1000.0000 ug | Freq: Once | INTRAMUSCULAR | Status: AC
  Administered 2015-04-15: 1000 ug via INTRAMUSCULAR

## 2015-04-28 DIAGNOSIS — A6000 Herpesviral infection of urogenital system, unspecified: Secondary | ICD-10-CM

## 2015-04-28 HISTORY — DX: Herpesviral infection of urogenital system, unspecified: A60.00

## 2015-05-18 ENCOUNTER — Ambulatory Visit (INDEPENDENT_AMBULATORY_CARE_PROVIDER_SITE_OTHER): Payer: BLUE CROSS/BLUE SHIELD

## 2015-05-18 DIAGNOSIS — E538 Deficiency of other specified B group vitamins: Secondary | ICD-10-CM

## 2015-05-18 MED ORDER — CYANOCOBALAMIN 1000 MCG/ML IJ SOLN
1000.0000 ug | Freq: Once | INTRAMUSCULAR | Status: AC
  Administered 2015-05-18: 1000 ug via INTRAMUSCULAR

## 2015-06-18 ENCOUNTER — Ambulatory Visit (INDEPENDENT_AMBULATORY_CARE_PROVIDER_SITE_OTHER): Payer: BLUE CROSS/BLUE SHIELD

## 2015-06-18 DIAGNOSIS — E538 Deficiency of other specified B group vitamins: Secondary | ICD-10-CM

## 2015-06-18 MED ORDER — CYANOCOBALAMIN 1000 MCG/ML IJ SOLN
1000.0000 ug | Freq: Once | INTRAMUSCULAR | Status: AC
  Administered 2015-06-18: 1000 ug via INTRAMUSCULAR

## 2015-07-14 ENCOUNTER — Other Ambulatory Visit (INDEPENDENT_AMBULATORY_CARE_PROVIDER_SITE_OTHER): Payer: BLUE CROSS/BLUE SHIELD

## 2015-07-14 DIAGNOSIS — E538 Deficiency of other specified B group vitamins: Secondary | ICD-10-CM

## 2015-07-14 LAB — VITAMIN B12: Vitamin B-12: 314 pg/mL (ref 211–911)

## 2015-07-15 ENCOUNTER — Other Ambulatory Visit: Payer: Self-pay | Admitting: Family Medicine

## 2015-07-15 DIAGNOSIS — E538 Deficiency of other specified B group vitamins: Secondary | ICD-10-CM

## 2015-07-27 ENCOUNTER — Ambulatory Visit (INDEPENDENT_AMBULATORY_CARE_PROVIDER_SITE_OTHER): Payer: BLUE CROSS/BLUE SHIELD | Admitting: Family Medicine

## 2015-07-27 ENCOUNTER — Encounter: Payer: Self-pay | Admitting: Family Medicine

## 2015-07-27 VITALS — BP 122/76 | HR 78 | Temp 98.3°F | Wt 156.8 lb

## 2015-07-27 DIAGNOSIS — M792 Neuralgia and neuritis, unspecified: Secondary | ICD-10-CM | POA: Insufficient documentation

## 2015-07-27 MED ORDER — GABAPENTIN 100 MG PO CAPS: 100.0000 mg | ORAL_CAPSULE | Freq: Three times a day (TID) | ORAL | Status: DC | PRN

## 2015-07-27 NOTE — Assessment & Plan Note (Signed)
D/w pt re: ddx.  Imaging seen today isn't definitive, as it is plain film of the C spine.  Sounds to be radicular.   She doesn't have weakness.  DTR for BUE are still 2+ B.   Okay for outpatient f/u.   Can take extra strength tylenol. 2 tabs 3 times a day  Take gabapentin 3 times a day if tolerated.  It is reasonable to consider seeing ortho, PT, or the chiropractor. I'll defer to her, can refer if needed.   I'll await f/u notes.  Update me as needed.  >25 minutes spent in face to face time with patient, >50% spent in counselling or coordination of care.

## 2015-07-27 NOTE — Patient Instructions (Addendum)
Extra strength tylenol.  2 tabs 3 times a day.  Take gabapentin 3 times a day if tolerated.  It is reasonable to consider seeing ortho, PT, or the chiropractor.   Update me as needed.  Take care.   I'll await notes from Dr. [name].

## 2015-07-27 NOTE — Progress Notes (Signed)
Pre visit review using our clinic review tool, if applicable. No additional management support is needed unless otherwise documented below in the visit note.  Neck pain.  Seeing chiropractor.  Pain into the L arm, radicular sensation.  No rash.  No FCNAVD.  Intolerant of tramadol.  Grip still wnl.  Normal sensation in the hand, but occ feeling that the L arm is going to sleep.  Taking gabapentin w/o a lot of relief, taking it BID.    She had outside imaging from chiropractor that I reviewed and sent for scanning. This isn't a formal radiology read.  She appears to have some mild degenerative changes in the lower C spine w/o acute process seen but the image quality is limited as it was printed on paper and I can't manipulate the images.   Meds, vitals, and allergies reviewed.   ROS: See HPI.  Otherwise, noncontributory.  nad ncat Neck supple, no LA L side of posterior neck with muscle spasms noted but no bruising or rash.   spurling's test neg S/S wnl BUE No grip weakness.  L shoulder with normal ROM and neg testing for int/ext rotation. rrr ctab

## 2015-10-14 ENCOUNTER — Other Ambulatory Visit (INDEPENDENT_AMBULATORY_CARE_PROVIDER_SITE_OTHER): Payer: BLUE CROSS/BLUE SHIELD

## 2015-10-14 DIAGNOSIS — E538 Deficiency of other specified B group vitamins: Secondary | ICD-10-CM

## 2015-10-14 LAB — VITAMIN B12: Vitamin B-12: 980 pg/mL — ABNORMAL HIGH (ref 211–911)

## 2015-10-15 ENCOUNTER — Other Ambulatory Visit: Payer: Self-pay | Admitting: Family Medicine

## 2015-10-15 MED ORDER — VITAMIN B-12 1000 MCG PO TABS: 1000.0000 ug | ORAL_TABLET | Freq: Every day | ORAL | Status: DC

## 2015-11-10 ENCOUNTER — Encounter: Payer: Self-pay | Admitting: *Deleted

## 2015-12-01 ENCOUNTER — Other Ambulatory Visit: Payer: Self-pay

## 2015-12-01 DIAGNOSIS — Z803 Family history of malignant neoplasm of breast: Secondary | ICD-10-CM

## 2015-12-01 DIAGNOSIS — Z1231 Encounter for screening mammogram for malignant neoplasm of breast: Secondary | ICD-10-CM

## 2015-12-28 DIAGNOSIS — M659 Synovitis and tenosynovitis, unspecified: Secondary | ICD-10-CM | POA: Diagnosis not present

## 2015-12-28 DIAGNOSIS — M79672 Pain in left foot: Secondary | ICD-10-CM | POA: Diagnosis not present

## 2015-12-28 DIAGNOSIS — M24571 Contracture, right ankle: Secondary | ICD-10-CM | POA: Diagnosis not present

## 2015-12-28 DIAGNOSIS — M79671 Pain in right foot: Secondary | ICD-10-CM | POA: Diagnosis not present

## 2015-12-31 ENCOUNTER — Ambulatory Visit: Payer: Self-pay

## 2016-01-04 DIAGNOSIS — I341 Nonrheumatic mitral (valve) prolapse: Secondary | ICD-10-CM | POA: Diagnosis not present

## 2016-01-04 DIAGNOSIS — R5383 Other fatigue: Secondary | ICD-10-CM | POA: Diagnosis not present

## 2016-01-04 DIAGNOSIS — E782 Mixed hyperlipidemia: Secondary | ICD-10-CM | POA: Diagnosis not present

## 2016-01-04 DIAGNOSIS — Z8679 Personal history of other diseases of the circulatory system: Secondary | ICD-10-CM | POA: Diagnosis not present

## 2016-01-04 DIAGNOSIS — R0789 Other chest pain: Secondary | ICD-10-CM | POA: Diagnosis not present

## 2016-01-04 DIAGNOSIS — I059 Rheumatic mitral valve disease, unspecified: Secondary | ICD-10-CM | POA: Diagnosis not present

## 2016-01-07 DIAGNOSIS — R0789 Other chest pain: Secondary | ICD-10-CM | POA: Diagnosis not present

## 2016-01-11 ENCOUNTER — Ambulatory Visit
Admission: RE | Admit: 2016-01-11 | Discharge: 2016-01-11 | Disposition: A | Discharge status: Home / Self Care | Payer: Medicare HMO | Source: Ambulatory Visit

## 2016-01-11 DIAGNOSIS — Z1231 Encounter for screening mammogram for malignant neoplasm of breast: Secondary | ICD-10-CM

## 2016-01-11 DIAGNOSIS — Z803 Family history of malignant neoplasm of breast: Secondary | ICD-10-CM

## 2016-01-13 ENCOUNTER — Ambulatory Visit: Payer: Self-pay | Admitting: General Surgery

## 2016-01-17 ENCOUNTER — Ambulatory Visit: Payer: Self-pay | Admitting: General Surgery

## 2016-01-25 ENCOUNTER — Ambulatory Visit: Payer: Self-pay | Admitting: General Surgery

## 2016-01-28 ENCOUNTER — Encounter: Payer: Self-pay | Admitting: Family Medicine

## 2016-02-08 ENCOUNTER — Ambulatory Visit: Payer: Self-pay | Admitting: General Surgery

## 2016-02-17 ENCOUNTER — Encounter: Payer: Self-pay | Admitting: General Surgery

## 2016-02-17 ENCOUNTER — Ambulatory Visit (INDEPENDENT_AMBULATORY_CARE_PROVIDER_SITE_OTHER): Payer: Medicare HMO | Admitting: General Surgery

## 2016-02-17 VITALS — BP 124/72 | HR 76 | Resp 14 | Ht 67.0 in | Wt 171.0 lb

## 2016-02-17 DIAGNOSIS — N6001 Solitary cyst of right breast: Secondary | ICD-10-CM | POA: Diagnosis not present

## 2016-02-17 NOTE — Progress Notes (Signed)
Patient ID: Debra Little, female   DOB: [birth year], 58 y.o.   MRN: [id number]  Chief Complaint  Patient presents with  . Follow-up    breast cyst    HPI Debra Little is a 60 y.o. female herer today for her follow up right breast cyst. Patient had her last mammogram on 01/11/16. No new breast issues.  I person reviewed the patient's history.     HPI  Past Medical History  Diagnosis Date  . IBS (irritable bowel syndrome)     constipation predominant  . Hiatal hernia   . Cyst of breast     per [name]  . Vitamin D deficiency     history of  . Anxiety     sees Dr. [name]  . MVP (mitral valve prolapse)     [facility] cards eval 2012  . Fibromyalgia   . IC (interstitial cystitis)     per [facility]  . Hemorrhoids   . Hyperlipidemia     Past Surgical History  Procedure Laterality Date  . Excision of breast cysts      hx of multiple cyst aspirations  . Abdominal hysterectomy    . Exploratory laparotomy    . Tubal ligation    . Colonoscopy  09/2014    Dr. [name]    Family History  Problem Relation Age of Onset  . Cancer Mother     breast  . Cancer Father     colon    Social History Social History  Substance Use Topics  . Smoking status: Never Smoker   . Smokeless tobacco: Never Used  . Alcohol Use: No    Allergies  Allergen Reactions  . Gabapentin     Drowsy at high doses  . Bentyl [Dicyclomine Hcl]     Lack of effect  . Celecoxib     REACTION: unspecified  . Cortisone Other (See Comments)    flush  . Doxycycline     Palpitations and chest pain  . Lipitor [Atorvastatin] Other (See Comments)    abdominal pain  . Mobic [Meloxicam]     Palpitations.  But can tolerate aleve  . Nitrofurantoin Monohyd Macro   . Penicillins     REACTION: rash  . Sulfonamide Derivatives     REACTION: nausea  . Tramadol Other (See Comments)    Intolerant.   [name] Zoloft [Sertraline Hcl]     nausea and diarrhea.       Current Outpatient  Prescriptions  Medication Sig Dispense Refill  . benzonatate (TESSALON) 200 MG capsule Take 1 capsule (200 mg total) by mouth 3 (three) times daily as needed for cough. 20 capsule 0  . clorazepate (TRANXENE-T) 7.5 MG tablet Take 7.5 mg by mouth 3 (three) times daily as needed.     . fluticasone (FLONASE) 50 MCG/ACT nasal spray Place 2 sprays into both nostrils daily. (Patient taking differently: Place 2 sprays into both nostrils daily as needed. ) 16 g 1  . gabapentin (NEURONTIN) 100 MG capsule Take 1 capsule (100 mg total) by mouth 3 (three) times daily as needed.    [name] imipramine (TOFRANIL) 10 MG tablet Take 30 mg by mouth daily.     [name] loratadine (CLARITIN) 10 MG tablet Take 1 tablet (10 mg total) by mouth daily. (Patient taking differently: Take 10 mg by mouth daily as needed. )    . Meth-Hyo-M Bl-Na Phos-Ph Sal (URIBEL) 118 MG CAPS As needed.    [name] omeprazole (PRILOSEC) 20 MG capsule Take  20 mg by mouth 2 (two) times daily before a meal. BRAND NAME ONLY    . senna (SENOKOT) 8.6 MG tablet Take 2 tablets by mouth twice weekly.    . vitamin B-12 (CYANOCOBALAMIN) 1000 MCG tablet Take 1 tablet (1,000 mcg total) by mouth daily.    . progesterone (PROMETRIUM) 100 MG capsule Take 100 mg by mouth daily.     No current facility-administered medications for this visit.    Review of Systems Review of Systems  Constitutional: Negative.   Respiratory: Negative.   Cardiovascular: Negative.     Blood pressure 124/72, pulse 76, resp. rate 14, height 5\' 7"  (1.702 m), weight 171 lb (77.565 kg).  Physical Exam Physical Exam  Constitutional: She is oriented to person, place, and time. She appears well-developed and well-nourished.  Eyes: Conjunctivae are normal. No scleral icterus.  Neck: Neck supple.  Cardiovascular: Normal rate, regular rhythm and normal heart sounds.   Pulmonary/Chest: Effort normal and breath sounds normal. Right breast exhibits no inverted nipple, no mass, no nipple discharge, no  skin change and no tenderness. Left breast exhibits no inverted nipple, no mass, no nipple discharge, no skin change and no tenderness.    Abdominal: Soft. Bowel sounds are normal. There is no tenderness.  Lymphadenopathy:    She has no cervical adenopathy.  Neurological: She is alert and oriented to person, place, and time.  Skin: Skin is warm and dry.    Data Reviewed Bilateral mammograms dated 01/11/2016 completed in [place] were reviewed. No interval change. BI-RADS-1.  Assessment    Benign breast exam. No palpable cyst. No focal tenderness necessitating aspiration or ultrasound    Plan    The patient should have annual screening mammograms with her primary care provider. She is well return at any time if new changes are noted either on her own self exam or on mammographic imaging.    Patient to return as needed.  PCP:  Tonia Ghent  This information has been scribed by Gaspar Cola CMA.    Robert Bellow 02/18/2016, 2:28 PM

## 2016-02-17 NOTE — Patient Instructions (Signed)
Patient to return as needed. Continue self breast exams. Call office for any new breast issues or concerns.'  

## 2016-02-22 DIAGNOSIS — R1084 Generalized abdominal pain: Secondary | ICD-10-CM | POA: Diagnosis not present

## 2016-02-22 DIAGNOSIS — K5909 Other constipation: Secondary | ICD-10-CM | POA: Diagnosis not present

## 2016-02-22 DIAGNOSIS — R1011 Right upper quadrant pain: Secondary | ICD-10-CM | POA: Diagnosis not present

## 2016-02-28 DIAGNOSIS — H900 Conductive hearing loss, bilateral: Secondary | ICD-10-CM | POA: Diagnosis not present

## 2016-02-28 DIAGNOSIS — H6123 Impacted cerumen, bilateral: Secondary | ICD-10-CM | POA: Diagnosis not present

## 2016-04-13 DIAGNOSIS — H524 Presbyopia: Secondary | ICD-10-CM | POA: Diagnosis not present

## 2016-04-13 DIAGNOSIS — H2513 Age-related nuclear cataract, bilateral: Secondary | ICD-10-CM | POA: Diagnosis not present

## 2016-04-14 ENCOUNTER — Ambulatory Visit: Payer: Self-pay | Admitting: Primary Care

## 2016-07-04 DIAGNOSIS — D518 Other vitamin B12 deficiency anemias: Secondary | ICD-10-CM | POA: Diagnosis not present

## 2016-07-04 DIAGNOSIS — I059 Rheumatic mitral valve disease, unspecified: Secondary | ICD-10-CM | POA: Diagnosis not present

## 2016-07-04 DIAGNOSIS — E782 Mixed hyperlipidemia: Secondary | ICD-10-CM | POA: Diagnosis not present

## 2016-07-04 DIAGNOSIS — I341 Nonrheumatic mitral (valve) prolapse: Secondary | ICD-10-CM | POA: Diagnosis not present

## 2016-07-06 DIAGNOSIS — I341 Nonrheumatic mitral (valve) prolapse: Secondary | ICD-10-CM | POA: Diagnosis not present

## 2016-07-06 DIAGNOSIS — I059 Rheumatic mitral valve disease, unspecified: Secondary | ICD-10-CM | POA: Diagnosis not present

## 2016-07-06 DIAGNOSIS — E782 Mixed hyperlipidemia: Secondary | ICD-10-CM | POA: Diagnosis not present

## 2016-07-06 DIAGNOSIS — D518 Other vitamin B12 deficiency anemias: Secondary | ICD-10-CM | POA: Diagnosis not present

## 2016-07-25 DIAGNOSIS — I341 Nonrheumatic mitral (valve) prolapse: Secondary | ICD-10-CM | POA: Diagnosis not present

## 2016-07-27 DIAGNOSIS — E7801 Familial hypercholesterolemia: Secondary | ICD-10-CM | POA: Diagnosis not present

## 2016-07-27 DIAGNOSIS — Z8679 Personal history of other diseases of the circulatory system: Secondary | ICD-10-CM | POA: Diagnosis not present

## 2016-07-27 DIAGNOSIS — I341 Nonrheumatic mitral (valve) prolapse: Secondary | ICD-10-CM | POA: Diagnosis not present

## 2016-07-27 DIAGNOSIS — E782 Mixed hyperlipidemia: Secondary | ICD-10-CM | POA: Diagnosis not present

## 2016-07-27 DIAGNOSIS — I059 Rheumatic mitral valve disease, unspecified: Secondary | ICD-10-CM | POA: Diagnosis not present

## 2016-08-16 DIAGNOSIS — Z315 Encounter for genetic counseling: Secondary | ICD-10-CM | POA: Diagnosis not present

## 2016-08-16 DIAGNOSIS — Z1239 Encounter for other screening for malignant neoplasm of breast: Secondary | ICD-10-CM | POA: Diagnosis not present

## 2016-08-16 DIAGNOSIS — Z79890 Hormone replacement therapy (postmenopausal): Secondary | ICD-10-CM | POA: Diagnosis not present

## 2016-08-16 DIAGNOSIS — Z124 Encounter for screening for malignant neoplasm of cervix: Secondary | ICD-10-CM | POA: Diagnosis not present

## 2016-08-16 DIAGNOSIS — Z8041 Family history of malignant neoplasm of ovary: Secondary | ICD-10-CM | POA: Diagnosis not present

## 2016-08-16 DIAGNOSIS — Z01419 Encounter for gynecological examination (general) (routine) without abnormal findings: Secondary | ICD-10-CM | POA: Diagnosis not present

## 2016-08-16 DIAGNOSIS — N951 Menopausal and female climacteric states: Secondary | ICD-10-CM | POA: Diagnosis not present

## 2016-08-29 DIAGNOSIS — R69 Illness, unspecified: Secondary | ICD-10-CM | POA: Diagnosis not present

## 2016-09-07 DIAGNOSIS — H902 Conductive hearing loss, unspecified: Secondary | ICD-10-CM | POA: Diagnosis not present

## 2016-09-07 DIAGNOSIS — R42 Dizziness and giddiness: Secondary | ICD-10-CM | POA: Diagnosis not present

## 2016-09-07 DIAGNOSIS — H6123 Impacted cerumen, bilateral: Secondary | ICD-10-CM | POA: Diagnosis not present

## 2016-09-12 DIAGNOSIS — M8588 Other specified disorders of bone density and structure, other site: Secondary | ICD-10-CM | POA: Diagnosis not present

## 2016-09-12 DIAGNOSIS — E28319 Asymptomatic premature menopause: Secondary | ICD-10-CM | POA: Diagnosis not present

## 2016-09-12 DIAGNOSIS — M8589 Other specified disorders of bone density and structure, multiple sites: Secondary | ICD-10-CM | POA: Diagnosis not present

## 2016-10-26 ENCOUNTER — Ambulatory Visit (INDEPENDENT_AMBULATORY_CARE_PROVIDER_SITE_OTHER): Payer: Medicare HMO | Admitting: Family Medicine

## 2016-10-26 ENCOUNTER — Encounter: Payer: Self-pay | Admitting: Family Medicine

## 2016-10-26 VITALS — BP 130/76 | HR 81 | Temp 99.0°F | Wt 153.8 lb

## 2016-10-26 DIAGNOSIS — F329 Major depressive disorder, single episode, unspecified: Secondary | ICD-10-CM

## 2016-10-26 DIAGNOSIS — F418 Other specified anxiety disorders: Secondary | ICD-10-CM

## 2016-10-26 DIAGNOSIS — F32A Depression, unspecified: Secondary | ICD-10-CM

## 2016-10-26 DIAGNOSIS — F419 Anxiety disorder, unspecified: Secondary | ICD-10-CM

## 2016-10-26 DIAGNOSIS — K589 Irritable bowel syndrome without diarrhea: Secondary | ICD-10-CM

## 2016-10-26 DIAGNOSIS — E785 Hyperlipidemia, unspecified: Secondary | ICD-10-CM

## 2016-10-26 MED ORDER — TRAMADOL HCL 50 MG PO TABS: 50.0000 mg | ORAL_TABLET | Freq: Two times a day (BID) | ORAL | 0 refills | Status: DC | PRN

## 2016-10-26 NOTE — Progress Notes (Signed)
"  I just don't feel good."   Diffuse aches, unclear if worse from statin.  D/w pt.  She is back on lipitor in the meantime.  She had taken tramadol occ w/o ADE recently.  D/w pt. Needed refill on tramadol.    Separate issue- upper abd pain- intermittent, can maybe go a month w/o sx- when present can radiate to the back.  Drinking ginger ale helps some.  She has talked to Dr. Seabron Spates about this.   It is noted that the patient did experience mild cramping symptoms during prev CCK infusion, with subsided after, on prev HIDA scan.   Dec in GB EF on progressive HIDA scans noted.    Constipation.  Amitiza didn't help.  Taking MOM as needed.  She can tolerate that.    Seeing psych with depression and wanted to get set up with counseling.  Referred.  No SI/HI.   PMH and SH reviewed  ROS: Per HPI unless specifically indicated in ROS section   Meds, vitals, and allergies reviewed.   GEN: nad, alert and oriented HEENT: mucous membranes moist NECK: supple w/o LA CV: rrr PULM: ctab, no inc wob ABD: soft, +bs EXT: no edema SKIN: no acute rash

## 2016-10-26 NOTE — Patient Instructions (Signed)
See Rosaria Ferries on the way out.  Let me talk to GI and surgery in the meantime.  Take care.  Glad to see you.

## 2016-10-26 NOTE — Progress Notes (Signed)
Pre visit review using our clinic review tool, if applicable. No additional management support is needed unless otherwise documented below in the visit note. 

## 2016-10-27 NOTE — Assessment & Plan Note (Addendum)
>  25 minutes spent in face to face time with patient, >50% spent in counselling or coordination of care. Continue statin for now.  She is putting up with some aches on med, though some of her MSK aches are likely multifactorial.  Can use tramadol when aches are at worst, used rarely.

## 2016-10-27 NOTE — Assessment & Plan Note (Addendum)
With constipation at baseline, taking MOM as needed.  Also with a separate abd pain and I question if GB pathology is causing sx.  Will ask for input from surgery and GI. It is noted that the patient did experience mild cramping symptoms during prev CCK infusion, with subsided after, on prev HIDA scan.   Dec in GB EF on progressive HIDA scans noted.

## 2016-10-27 NOTE — Assessment & Plan Note (Signed)
Refer for counseling.  Okay for outpatient f/u.

## 2016-11-23 ENCOUNTER — Telehealth: Payer: Self-pay | Admitting: *Deleted

## 2016-11-23 NOTE — Telephone Encounter (Signed)
She called to let us know that she was having some occasional left breast shooting pains. She states this started after having a cardiac echo. Advised to use tylenol and heating pad for comfort. Appointment made for evaluation, her mammogram is due for February.

## 2016-11-30 ENCOUNTER — Encounter: Payer: Self-pay | Admitting: *Deleted

## 2016-12-06 ENCOUNTER — Ambulatory Visit: Payer: Medicare HMO | Admitting: General Surgery

## 2016-12-08 ENCOUNTER — Other Ambulatory Visit: Payer: Self-pay | Admitting: General Surgery

## 2016-12-08 DIAGNOSIS — Z1231 Encounter for screening mammogram for malignant neoplasm of breast: Secondary | ICD-10-CM

## 2016-12-11 ENCOUNTER — Ambulatory Visit: Payer: Self-pay | Admitting: Psychology

## 2017-01-02 ENCOUNTER — Ambulatory Visit: Payer: Medicare HMO | Admitting: Urology

## 2017-01-02 ENCOUNTER — Encounter: Payer: Self-pay | Admitting: Urology

## 2017-01-02 VITALS — BP 121/72 | HR 88 | Ht 67.0 in | Wt 150.9 lb

## 2017-01-02 DIAGNOSIS — N3946 Mixed incontinence: Secondary | ICD-10-CM

## 2017-01-02 DIAGNOSIS — R350 Frequency of micturition: Secondary | ICD-10-CM | POA: Diagnosis not present

## 2017-01-02 LAB — BLADDER SCAN AMB NON-IMAGING: Scan Result: 102

## 2017-01-02 NOTE — Progress Notes (Signed)
01/02/2017 2:42 PM   Debra Little [birth year] [id number]  Referring provider: Tonia Ghent, MD [address] [place], [place] [address]  Chief Complaint  Patient presents with  . Urinary Frequency    HPI: Patient is a 61 -year-old Caucasian female who is referred to [place] by, Dr. Damita Dunnings, for urinary frequency.    Patient states today she is having frequency x 8, urgency x 8, engaging in toilet mapping, incontinence x 7 and nocturia x 3.  She has had these symptoms for several years.  She hasn't been back as she wanted a break from going to doctors all the time.  What brings her in today is having leakage with intercourse.  This started several weeks ago.  She is having urge incontinence and with stress.  Her incontinence volume is large.   She is wearing 5 thin pads daily.  She does have episodes of incontinence that go through her clothes.    She is drinking three 20 oz bottle of H2O daily.  She does not have a history of urinary tract infections, STI's or injury to the bladder.  She denies dysuria, gross hematuria, suprapubic pain, back pain, abdominal pain or flank pain.  She has not had any recent fevers, chills, nausea or vomiting.   She does not have a history of nephrolithiasis, GU surgery or GU trauma.   She is not sexually active.  She has noted incontinence with sexual intercourse.  She is post menopausal.   She admits to constipation.   She is not having pain with bladder filling.  She has not had any recent imaging studies.    Her risk factors for incontinence are a family history of incontinence, age, depression, vaginal atrophy and pelvic surgery  She is taking antidepressants.  She had undergone urethral dilations in the past.     PMH: Past Medical History:  Diagnosis Date  . Anxiety    sees Dr. [name]  . Cyst of breast    per [name]  . Fibromyalgia   . Hemorrhoids   . Hiatal hernia   . Hyperlipidemia   . IBS (irritable bowel syndrome)      constipation predominant  . IC (interstitial cystitis)    per [facility]  . MVP (mitral valve prolapse)    [facility] cards eval 2012  . Vitamin D deficiency    history of    Surgical History: Past Surgical History:  Procedure Laterality Date  . ABDOMINAL HYSTERECTOMY    . COLONOSCOPY  09/2014   Dr. [name]  . excision of breast cysts     hx of multiple cyst aspirations  . EXPLORATORY LAPAROTOMY    . TUBAL LIGATION      Home Medications:  Allergies as of 01/02/2017      Reactions   Gabapentin    Drowsy at high doses   Bentyl [dicyclomine Hcl]    Lack of effect   Celecoxib    REACTION: unspecified   Cortisone Other (See Comments)   flush   Doxycycline    Palpitations and chest pain   Mobic [meloxicam]    Palpitations.  But can tolerate aleve   Nitrofurantoin Monohyd Macro    Penicillins    REACTION: rash   Sulfonamide Derivatives    REACTION: nausea   Zoloft [sertraline Hcl]    nausea and diarrhea.        Medication List       Accurate as of 01/02/17  2:42 PM. Always  use your most recent med list.          atorvastatin 10 MG tablet Commonly known as:  LIPITOR Take 10 mg by mouth daily.   benzonatate 200 MG capsule Commonly known as:  TESSALON Take 1 capsule (200 mg total) by mouth 3 (three) times daily as needed for cough.   cholecalciferol 1000 units tablet Commonly known as:  VITAMIN D Take 1,000 Units by mouth daily.   fluticasone 50 MCG/ACT nasal spray Commonly known as:  FLONASE Place 2 sprays into both nostrils daily.   gabapentin 100 MG capsule Commonly known as:  NEURONTIN Take 1 capsule (100 mg total) by mouth 3 (three) times daily as needed.   imipramine 10 MG tablet Commonly known as:  TOFRANIL Take 30 mg by mouth daily.   loratadine 10 MG tablet Commonly known as:  CLARITIN Take 1 tablet (10 mg total) by mouth daily.   omeprazole 20 MG capsule Commonly known as:  PRILOSEC Take 20 mg by mouth 2 (two) times daily before a  meal. BRAND NAME ONLY   progesterone 100 MG capsule Commonly known as:  PROMETRIUM Take 100 mg by mouth daily.   senna 8.6 MG tablet Commonly known as:  SENOKOT Take 2 tablets by mouth twice weekly.   traMADol 50 MG tablet Commonly known as:  ULTRAM Take 1 tablet (50 mg total) by mouth every 12 (twelve) hours as needed (for pain).   TRANXENE-T 7.5 MG tablet Generic drug:  clorazepate Take 7.5 mg by mouth 3 (three) times daily as needed.   URIBEL 118 MG Caps As needed.   vitamin B-12 1000 MCG tablet Commonly known as:  CYANOCOBALAMIN Take 1 tablet (1,000 mcg total) by mouth daily.       Allergies:  Allergies  Allergen Reactions  . Gabapentin     Drowsy at high doses  . Bentyl [Dicyclomine Hcl]     Lack of effect  . Celecoxib     REACTION: unspecified  . Cortisone Other (See Comments)    flush  . Doxycycline     Palpitations and chest pain  . Mobic [Meloxicam]     Palpitations.  But can tolerate aleve  . Nitrofurantoin Monohyd Macro   . Penicillins     REACTION: rash  . Sulfonamide Derivatives     REACTION: nausea  . Zoloft [Sertraline Hcl]     nausea and diarrhea.       Family History: Family History  Problem Relation Age of Onset  . Cancer Mother     breast  . Cancer Father     colon    Social History:  reports that she has never smoked. She has never used smokeless tobacco. She reports that she does not drink alcohol or use drugs.  ROS: UROLOGY Frequent Urination?: Yes Hard to postpone urination?: Yes Burning/pain with urination?: No Get up at night to urinate?: Yes Leakage of urine?: Yes Urine stream starts and stops?: No Trouble starting stream?: No Do you have to strain to urinate?: No Blood in urine?: No Urinary tract infection?: No Sexually transmitted disease?: No Injury to kidneys or bladder?: No Painful intercourse?: No Weak stream?: No Currently pregnant?: No Vaginal bleeding?: No Last menstrual period?:  n  Gastrointestinal Nausea?: No Vomiting?: No Indigestion/heartburn?: No Diarrhea?: No Constipation?: Yes  Constitutional Fever: No Night sweats?: No Weight loss?: No Fatigue?: Yes  Skin Skin rash/lesions?: No Itching?: No  Eyes Blurred vision?: No Double vision?: No  Ears/Nose/Throat Sore throat?: No Sinus problems?: No  Hematologic/Lymphatic Swollen glands?: No Easy bruising?: No  Cardiovascular Leg swelling?: No Chest pain?: No  Respiratory Cough?: No Shortness of breath?: No  Endocrine Excessive thirst?: Yes  Musculoskeletal Back pain?: No Joint pain?: Yes  Neurological Headaches?: No Dizziness?: No  Psychologic Depression?: No Anxiety?: Yes  Physical Exam: BP 121/72 (BP Location: Left Arm, Patient Position: Sitting, Cuff Size: Normal)   Pulse 88   Ht 5\' 7"  (1.702 m)   Wt 150 lb 14.4 oz (68.4 kg)   BMI 23.63 kg/m   Constitutional: Well nourished. Alert and oriented, No acute distress. HEENT:  AT, moist mucus membranes. Trachea midline, no masses. Cardiovascular: No clubbing, cyanosis, or edema. Respiratory: Normal respiratory effort, no increased work of breathing. GI: Abdomen is soft, non tender, non distended, no abdominal masses. Liver and spleen not palpable.  No hernias appreciated.  Stool sample for occult testing is not indicated.   GU: No CVA tenderness.  No bladder fullness or masses.  Atorphicexternal genitalia, normal pubic hair distribution, no lesions.  Normal urethral meatus, no lesions, no prolapse, no discharge.   No urethral masses, tenderness and/or tenderness. No bladder fullness, tenderness or masses.  Pale vagina mucosa, poor estrogen effect, no discharge, no lesions, good pelvic support, Grade II cystocele is noted.  No  rectocele noted.  Cervix, uterus and adnexa are surgically absent.  Anus and perineum are without rashes or lesions.    Skin: No rashes, bruises or suspicious lesions. Lymph: No cervical or inguinal  adenopathy. Neurologic: Grossly intact, no focal deficits, moving all 4 extremities. Psychiatric: Normal mood and affect.  Laboratory Data: Lab Results  Component Value Date   WBC 9.5 03/04/2015   HGB 12.6 03/04/2015   HCT 37.2 03/04/2015   MCV 82.5 03/04/2015   PLT 300.0 03/04/2015    Lab Results  Component Value Date   CREATININE 0.87 03/04/2015    Lab Results  Component Value Date   TSH 2.13 03/04/2015       Component Value Date/Time   CHOL 229 09/30/2010 0000   HDL 55 09/30/2010 0000   LDLCALC 156 09/30/2010 0000    Lab Results  Component Value Date   AST 14 03/04/2015   Lab Results  Component Value Date   ALT 8 03/04/2015    Pertinent Imaging: Results for MERCED, TRENCHARD (MRN [id number]) as of 01/02/2017 14:27  Ref. Range 01/02/2017 13:57  Scan Result Unknown 102    Assessment & Plan:    1. Mixed incontinence  - offered behavioral therapies, bladder training, bladder control strategies and pelvic floor muscle training - patient deferred  - fluid management - patient is drinking a good amount of water  - offered medical therapy with anticholinergic therapy or beta-3 adrenergic receptor agonist and the potential side effects of each therapy - patient deferred  - offered refer to gynecology for a pessary fitting - not interested in this as many her friends have had bad experiencing with pessary  - offered an appointment with one of our physician for further evaluation and management - patient would like an appointment with Dr. Matilde Sprang as she is very unsure of what treatment options she should pursue and she has difficult time making decisions    2. Urinary frequency  - Bladder Scan (Post Void Residual) in office  - see above   Return for appointment with Dr. Matilde Sprang.  These notes generated with voice recognition software. I apologize for typographical errors.  Zara Council, [facility] [address]  [place], [place] [address] [phone]

## 2017-01-11 ENCOUNTER — Ambulatory Visit: Payer: Self-pay

## 2017-01-11 DIAGNOSIS — I10 Essential (primary) hypertension: Secondary | ICD-10-CM | POA: Diagnosis not present

## 2017-01-11 DIAGNOSIS — K219 Gastro-esophageal reflux disease without esophagitis: Secondary | ICD-10-CM | POA: Diagnosis not present

## 2017-01-11 DIAGNOSIS — I341 Nonrheumatic mitral (valve) prolapse: Secondary | ICD-10-CM | POA: Diagnosis not present

## 2017-01-11 DIAGNOSIS — I059 Rheumatic mitral valve disease, unspecified: Secondary | ICD-10-CM | POA: Diagnosis not present

## 2017-01-11 DIAGNOSIS — E782 Mixed hyperlipidemia: Secondary | ICD-10-CM | POA: Diagnosis not present

## 2017-01-12 ENCOUNTER — Ambulatory Visit
Admission: RE | Admit: 2017-01-12 | Discharge: 2017-01-12 | Disposition: A | Discharge status: Home / Self Care | Payer: Medicare HMO | Source: Ambulatory Visit | Attending: General Surgery | Admitting: General Surgery

## 2017-01-12 DIAGNOSIS — Z1231 Encounter for screening mammogram for malignant neoplasm of breast: Secondary | ICD-10-CM | POA: Diagnosis not present

## 2017-01-15 ENCOUNTER — Other Ambulatory Visit: Payer: Self-pay | Admitting: General Surgery

## 2017-01-15 DIAGNOSIS — R928 Other abnormal and inconclusive findings on diagnostic imaging of breast: Secondary | ICD-10-CM

## 2017-01-17 ENCOUNTER — Other Ambulatory Visit: Payer: Self-pay | Admitting: Family Medicine

## 2017-01-17 DIAGNOSIS — R928 Other abnormal and inconclusive findings on diagnostic imaging of breast: Secondary | ICD-10-CM

## 2017-01-18 ENCOUNTER — Ambulatory Visit: Payer: Medicare HMO | Admitting: Urology

## 2017-01-18 ENCOUNTER — Encounter: Payer: Self-pay | Admitting: Urology

## 2017-01-18 VITALS — BP 126/73 | HR 75 | Ht 67.0 in | Wt 149.1 lb

## 2017-01-18 DIAGNOSIS — N3946 Mixed incontinence: Secondary | ICD-10-CM

## 2017-01-18 NOTE — Progress Notes (Signed)
01/18/2017 3:49 PM   Debra Little [birth year] [id number]  Referring provider: Tonia Ghent, MD [address] [place], [place] [address]  Chief Complaint  Patient presents with  . Follow-up    mixed stress and urge incontinence    HPI: Debra Little: Patient states today she is having frequency x 8, urgency x 8, engaging in toilet mapping, incontinence x 7 and nocturia x 3.  She has had these symptoms for several years.  She hasn't been back as she wanted a break from going to doctors all the time.  What brings her in today is having leakage with intercourse.  This started several weeks ago.  She is having urge incontinence and with stress.  Her incontinence volume is large.   She is wearing 5 thin pads daily.  She does have episodes of incontinence that go through her clothes.    Today The patient has had worsening incontinence over a number years. She leaks with coughing sneezing bending and lifting. She has urgency incontinence. She believes a stress component is most significant. Sometimes she leaks during intercourse with resting and other times with urgency. She does not have bedwetting. She wears 3 or 4 pads a day that are variably wet.  She voids every 1-2 hours and gets up twice at night to urinate. Her flow is good.  Many years ago she describes an abdominal hysterectomy and a possible bladder suspension. She was told many years ago in [place] she interstitial cystitis.  She denies a history kidney stones and urinary tract infections. Her bowel function is normal. She has no neurologic issues. The presentation is not been medically treatedModifying factors: There are no other modifying factors  Associated signs and symptoms: There are no other associated signs and symptoms Aggravating and relieving factors: There are no other aggravating or relieving factors Severity: Moderate Duration: Persistent       PMH: Past Medical History:  Diagnosis Date  .  Anxiety    sees Dr. [name]  . Cyst of breast    per [name]  . Fibromyalgia   . Hemorrhoids   . Hiatal hernia   . Hyperlipidemia   . IBS (irritable bowel syndrome)    constipation predominant  . IC (interstitial cystitis)    per [facility]  . MVP (mitral valve prolapse)    [facility] cards eval 2012  . Vitamin D deficiency    history of    Surgical History: Past Surgical History:  Procedure Laterality Date  . ABDOMINAL HYSTERECTOMY    . BREAST EXCISIONAL BIOPSY Right   . BREAST EXCISIONAL BIOPSY Bilateral   . COLONOSCOPY  09/2014   Dr. [name]  . excision of breast cysts     hx of multiple cyst aspirations  . EXPLORATORY LAPAROTOMY    . TUBAL LIGATION      Home Medications:  Allergies as of 01/18/2017      Reactions   Gabapentin    Drowsy at high doses   Bentyl [dicyclomine Hcl]    Lack of effect   Celecoxib    REACTION: unspecified   Cortisone Other (See Comments)   flush   Doxycycline    Palpitations and chest pain   Mobic [meloxicam]    Palpitations.  But can tolerate aleve   Nitrofurantoin Monohyd Macro    Penicillins    REACTION: rash   Sulfonamide Derivatives    REACTION: nausea   Zoloft [sertraline Hcl]    nausea and diarrhea.  Medication List       Accurate as of 01/18/17  3:49 PM. Always use your most recent med list.          atorvastatin 10 MG tablet Commonly known as:  LIPITOR Take 10 mg by mouth daily.   cholecalciferol 1000 units tablet Commonly known as:  VITAMIN D Take 1,000 Units by mouth daily.   fluticasone 50 MCG/ACT nasal spray Commonly known as:  FLONASE Place 2 sprays into both nostrils daily.   gabapentin 100 MG capsule Commonly known as:  NEURONTIN Take 1 capsule (100 mg total) by mouth 3 (three) times daily as needed.   imipramine 10 MG tablet Commonly known as:  TOFRANIL Take 30 mg by mouth daily.   loratadine 10 MG tablet Commonly known as:  CLARITIN Take 1 tablet (10 mg total) by mouth  daily.   magnesium hydroxide 400 MG/5ML suspension Commonly known as:  MILK OF MAGNESIA Take by mouth daily as needed for mild constipation.   omeprazole 20 MG capsule Commonly known as:  PRILOSEC Take 20 mg by mouth 2 (two) times daily before a meal. BRAND NAME ONLY   progesterone 100 MG capsule Commonly known as:  PROMETRIUM Take 100 mg by mouth daily.   senna 8.6 MG tablet Commonly known as:  SENOKOT Take 2 tablets by mouth twice weekly.   traMADol 50 MG tablet Commonly known as:  ULTRAM Take 1 tablet (50 mg total) by mouth every 12 (twelve) hours as needed (for pain).   TRANXENE-T 7.5 MG tablet Generic drug:  clorazepate Take 7.5 mg by mouth 3 (three) times daily as needed.   URIBEL 118 MG Caps As needed.   vitamin B-12 1000 MCG tablet Commonly known as:  CYANOCOBALAMIN Take 1 tablet (1,000 mcg total) by mouth daily.       Allergies:  Allergies  Allergen Reactions  . Gabapentin     Drowsy at high doses  . Bentyl [Dicyclomine Hcl]     Lack of effect  . Celecoxib     REACTION: unspecified  . Cortisone Other (See Comments)    flush  . Doxycycline     Palpitations and chest pain  . Mobic [Meloxicam]     Palpitations.  But can tolerate aleve  . Nitrofurantoin Monohyd Macro   . Penicillins     REACTION: rash  . Sulfonamide Derivatives     REACTION: nausea  . Zoloft [Sertraline Hcl]     nausea and diarrhea.       Family History: Family History  Problem Relation Age of Onset  . Cancer Mother     breast  . Cancer Father     colon    Social History:  reports that she has never smoked. She has never used smokeless tobacco. She reports that she does not drink alcohol or use drugs.  ROS:                                        Physical Exam: BP 126/73   Pulse 75   Ht 5\' 7"  (1.702 m)   Wt 67.6 kg (149 lb 1.6 oz)   BMI 23.35 kg/m   Constitutional:  Alert and oriented, No acute distress. HEENT: [place] AT, moist mucus membranes.   Trachea midline, no masses. Cardiovascular: No clubbing, cyanosis, or edema. Respiratory: Normal respiratory effort, no increased work of breathing. GI: Abdomen is soft, nontender, nondistended, no  abdominal masses GU: No CVA tenderness. The patient on pelvic examination had no stress incontinence. She has grade 2 hypermobility of the bladder neck. One could argue she has a cystourethrocele but no true cystocele. She had no rectocele and good vaginal length. The rotational descent of the urethra and bladder neck was her primary problem Skin: No rashes, bruises or suspicious lesions. Lymph: No cervical or inguinal adenopathy. Neurologic: Grossly intact, no focal deficits, moving all 4 extremities. Psychiatric: Normal mood and affect.  Laboratory Data: Lab Results  Component Value Date   WBC 9.5 03/04/2015   HGB 12.6 03/04/2015   HCT 37.2 03/04/2015   MCV 82.5 03/04/2015   PLT 300.0 03/04/2015    Lab Results  Component Value Date   CREATININE 0.87 03/04/2015    No results found for: PSA  No results found for: TESTOSTERONE  No results found for: HGBA1C  Urinalysis    Component Value Date/Time   BILIRUBINUR Neg 03/04/2015 1655   PROTEINUR Neg 03/04/2015 1655   UROBILINOGEN 4.0 03/04/2015 1655   NITRITE Neg 03/04/2015 1655   LEUKOCYTESUR Negative 03/04/2015 1655    Pertinent Imaging: none  Assessment & Plan:  The patient has mixed incontinence. She has frequency and nocturia. She may have had a previous abdominal bladder suspension. I'm not convinced she has interstitial cystitis but evidence of pain with bladder filling will be assessed during urodynamics. I think cystoscopy and urodynamics would be helpful. She does not describe pelvic pain or but at times has some intermittent dyspareunia  During urodynamics I will also look for the presence of a larger cystocele.  There are no diagnoses linked to this encounter.  Return in about 2 weeks (around 02/01/2017) for UDS  [place]; cysto when she returns for results.  Reece Packer, MD  [facility] [address], [place],  [address] [phone]

## 2017-01-19 ENCOUNTER — Ambulatory Visit
Admission: RE | Admit: 2017-01-19 | Discharge: 2017-01-19 | Disposition: A | Discharge status: Home / Self Care | Payer: Medicare HMO | Source: Ambulatory Visit | Attending: General Surgery | Admitting: General Surgery

## 2017-01-19 DIAGNOSIS — R922 Inconclusive mammogram: Secondary | ICD-10-CM | POA: Diagnosis not present

## 2017-01-19 DIAGNOSIS — R928 Other abnormal and inconclusive findings on diagnostic imaging of breast: Secondary | ICD-10-CM

## 2017-01-19 DIAGNOSIS — N6489 Other specified disorders of breast: Secondary | ICD-10-CM | POA: Diagnosis not present

## 2017-01-25 DIAGNOSIS — F410 Panic disorder [episodic paroxysmal anxiety] without agoraphobia: Secondary | ICD-10-CM | POA: Diagnosis not present

## 2017-01-25 DIAGNOSIS — F330 Major depressive disorder, recurrent, mild: Secondary | ICD-10-CM | POA: Diagnosis not present

## 2017-01-25 DIAGNOSIS — R69 Illness, unspecified: Secondary | ICD-10-CM | POA: Diagnosis not present

## 2017-01-30 ENCOUNTER — Telehealth: Payer: Self-pay

## 2017-01-30 NOTE — Telephone Encounter (Signed)
Notified patient as instructed, patient pleased. Discussed follow-up appointments, patient agrees  

## 2017-01-30 NOTE — Telephone Encounter (Signed)
-----   Message from Robert Bellow, MD sent at 01/29/2017  4:01 PM EST ----- Please notify the patient that I reviewed her two sets of mammograms from February. All well.   ----- Message ----- From: Interface, Rad Results In Sent: 01/12/2017   4:04 PM To: Robert Bellow, MD

## 2017-02-02 ENCOUNTER — Ambulatory Visit: Payer: Self-pay | Admitting: Family Medicine

## 2017-02-05 ENCOUNTER — Ambulatory Visit: Payer: Self-pay | Admitting: Psychology

## 2017-02-12 ENCOUNTER — Ambulatory Visit (INDEPENDENT_AMBULATORY_CARE_PROVIDER_SITE_OTHER): Payer: Medicare HMO | Admitting: Psychology

## 2017-02-12 DIAGNOSIS — F411 Generalized anxiety disorder: Secondary | ICD-10-CM | POA: Diagnosis not present

## 2017-02-12 DIAGNOSIS — F330 Major depressive disorder, recurrent, mild: Secondary | ICD-10-CM

## 2017-02-12 DIAGNOSIS — R69 Illness, unspecified: Secondary | ICD-10-CM | POA: Diagnosis not present

## 2017-02-14 ENCOUNTER — Telehealth: Payer: Self-pay

## 2017-02-14 ENCOUNTER — Other Ambulatory Visit: Payer: Self-pay | Admitting: Obstetrics and Gynecology

## 2017-02-14 MED ORDER — VALACYCLOVIR HCL 500 MG PO TABS: 500.0000 mg | ORAL_TABLET | Freq: Every day | ORAL | 6 refills | Status: DC

## 2017-02-14 NOTE — Telephone Encounter (Signed)
Pt wants to know if she can have enough refills to last until September her annual, also wants to know if she can take 1 pill aday. [facility]

## 2017-02-14 NOTE — Telephone Encounter (Signed)
Pt requesting to speak w/[facility] regarding Valacyclovir. [phone]

## 2017-02-14 NOTE — Progress Notes (Unsigned)
valtre

## 2017-02-19 ENCOUNTER — Encounter: Payer: Self-pay | Admitting: Internal Medicine

## 2017-02-19 ENCOUNTER — Ambulatory Visit (INDEPENDENT_AMBULATORY_CARE_PROVIDER_SITE_OTHER): Payer: Medicare HMO | Admitting: Internal Medicine

## 2017-02-19 VITALS — BP 124/76 | HR 86 | Temp 98.0°F | Wt 148.5 lb

## 2017-02-19 DIAGNOSIS — L298 Other pruritus: Secondary | ICD-10-CM

## 2017-02-19 DIAGNOSIS — N949 Unspecified condition associated with female genital organs and menstrual cycle: Secondary | ICD-10-CM | POA: Diagnosis not present

## 2017-02-19 DIAGNOSIS — B3731 Acute candidiasis of vulva and vagina: Secondary | ICD-10-CM

## 2017-02-19 DIAGNOSIS — N898 Other specified noninflammatory disorders of vagina: Secondary | ICD-10-CM | POA: Diagnosis not present

## 2017-02-19 DIAGNOSIS — B373 Candidiasis of vulva and vagina: Secondary | ICD-10-CM | POA: Diagnosis not present

## 2017-02-19 LAB — POC URINALSYSI DIPSTICK (AUTOMATED)
Bilirubin, UA: NEGATIVE
Blood, UA: NEGATIVE
Glucose, UA: NEGATIVE
Ketones, UA: NEGATIVE
Leukocytes, UA: NEGATIVE
Nitrite, UA: NEGATIVE
Protein, UA: NEGATIVE
Spec Grav, UA: 1.01 (ref 1.030–1.035)
Urobilinogen, UA: NEGATIVE (ref ?–2.0)
pH, UA: 6 (ref 5.0–8.0)

## 2017-02-19 MED ORDER — FLUCONAZOLE 150 MG PO TABS: 150.0000 mg | ORAL_TABLET | Freq: Once | ORAL | 0 refills | Status: AC

## 2017-02-19 NOTE — Addendum Note (Signed)
Addended by: Lurlean Nanny on: 02/19/2017 02:54 PM   Modules accepted: Orders

## 2017-02-19 NOTE — Progress Notes (Signed)
Subjective:    Patient ID: Debra Little, female    DOB: 09/15/1956, 61 y.o.   MRN: [id number]  HPI  Pt presents to the clinic today with c/o vaginal irritation, itching and burning. This started 1 week ago. She denies any vaginal discharge or odor. She denies abnormal bleeding. She has tried OTC yeast medication without any relief.  Review of Systems      Past Medical History:  Diagnosis Date  . Anxiety    sees Dr. [name]  . Cyst of breast    per [name]  . Fibromyalgia   . Hemorrhoids   . Hiatal hernia   . Hyperlipidemia   . IBS (irritable bowel syndrome)    constipation predominant  . IC (interstitial cystitis)    per [facility]  . MVP (mitral valve prolapse)    [facility] cards eval 2012  . Vitamin D deficiency    history of    Current Outpatient Prescriptions  Medication Sig Dispense Refill  . atorvastatin (LIPITOR) 40 MG tablet Take 40 mg by mouth daily.  1  . cholecalciferol (VITAMIN D) 1000 units tablet Take 1,000 Units by mouth daily.    . clonazePAM (KLONOPIN) 0.5 MG tablet Take 0.5 mg by mouth 2 (two) times daily as needed.     . clorazepate (TRANXENE-T) 7.5 MG tablet Take 7.5 mg by mouth 3 (three) times daily as needed.     . fluticasone (FLONASE) 50 MCG/ACT nasal spray Place 2 sprays into both nostrils daily. (Patient taking differently: Place 2 sprays into both nostrils daily as needed. ) 16 g 1  . gabapentin (NEURONTIN) 100 MG capsule Take 1 capsule (100 mg total) by mouth 3 (three) times daily as needed.    [name] imipramine (TOFRANIL) 10 MG tablet Take 30 mg by mouth daily.     [name] loratadine (CLARITIN) 10 MG tablet Take 1 tablet (10 mg total) by mouth daily. (Patient taking differently: Take 10 mg by mouth daily as needed. )    . magnesium hydroxide (MILK OF MAGNESIA) 400 MG/5ML suspension Take by mouth daily as needed for mild constipation.    . Meth-Hyo-M Bl-Na Phos-Ph Sal (URIBEL) 118 MG CAPS As needed.    [name] omeprazole (PRILOSEC) 20 MG capsule  Take 20 mg by mouth 2 (two) times daily before a meal. BRAND NAME ONLY    . progesterone (PROMETRIUM) 100 MG capsule Take 100 mg by mouth daily.    [name] senna (SENOKOT) 8.6 MG tablet Take 2 tablets by mouth twice weekly.    . traMADol (ULTRAM) 50 MG tablet Take 1 tablet (50 mg total) by mouth every 12 (twelve) hours as needed (for pain). 20 tablet 0  . valACYclovir (VALTREX) 500 MG tablet Take 1 tablet (500 mg total) by mouth daily. 30 tablet 6  . vitamin B-12 (CYANOCOBALAMIN) 1000 MCG tablet Take 1 tablet (1,000 mcg total) by mouth daily.     No current facility-administered medications for this visit.     Allergies  Allergen Reactions  . Gabapentin     Drowsy at high doses  . Bentyl [Dicyclomine Hcl]     Lack of effect  . Celecoxib     REACTION: unspecified  . Cortisone Other (See Comments)    flush  . Doxycycline     Palpitations and chest pain  . Mobic [Meloxicam]     Palpitations.  But can tolerate aleve  . Nitrofurantoin Monohyd Macro   . Penicillins     REACTION: rash  .  Sulfonamide Derivatives     REACTION: nausea  . Zoloft [Sertraline Hcl]     nausea and diarrhea.       Family History  Problem Relation Age of Onset  . Cancer Mother     breast  . Cancer Father     colon    Social History   Social History  . Marital status: Married    Spouse name: N/A  . Number of children: 1  . Years of education: N/A   Occupational History  . [employer]    Social History Main Topics  . Smoking status: Never Smoker  . Smokeless tobacco: Never Used  . Alcohol use No  . Drug use: No  . Sexual activity: Not on file   Other Topics Concern  . Not on file   Social History Narrative   Married in 1998, 1 son from previous relationship   Brother with MVA at 27, in rest home as of 2011.     Constitutional: Denies fever, malaise, fatigue, headache or abrupt weight changes.  Gastrointestinal: Denies abdominal pain, bloating, constipation, diarrhea or blood in the stool.    GU: Pt reports vaginal itching, irritation and burning sensation. Denies urgency, frequency, pain with urination, blood in urine, odor or discharge.   No other specific complaints in a complete review of systems (except as listed in HPI above).  Objective:   Physical Exam  BP 124/76   Pulse 86   Temp 98 F (36.7 C) (Oral)   Wt 148 lb 8 oz (67.4 kg)   SpO2 99%   BMI 23.26 kg/m  Wt Readings from Last 3 Encounters:  02/19/17 148 lb 8 oz (67.4 kg)  01/18/17 149 lb 1.6 oz (67.6 kg)  01/02/17 150 lb 14.4 oz (68.4 kg)    General: Appears her stated age, well developed, well nourished in NAD. Abdomen: Soft and nontender. Normal bowel sounds. No distention or masses noted.  Pelvic: Pt self swabbed.  BMET    Component Value Date/Time   NA 138 03/04/2015 1702   K 4.1 03/04/2015 1702   CL 102 03/04/2015 1702   CO2 29 03/04/2015 1702   GLUCOSE 89 03/04/2015 1702   BUN 15 03/04/2015 1702   CREATININE 0.87 03/04/2015 1702   CALCIUM 9.5 03/04/2015 1702    Lipid Panel     Component Value Date/Time   CHOL 229 09/30/2010 0000   TRIG 90 09/30/2010 0000   HDL 55 09/30/2010 0000   LDLCALC 156 09/30/2010 0000    CBC    Component Value Date/Time   WBC 9.5 03/04/2015 1702   RBC 4.51 03/04/2015 1702   HGB 12.6 03/04/2015 1702   HCT 37.2 03/04/2015 1702   PLT 300.0 03/04/2015 1702   MCV 82.5 03/04/2015 1702   MCHC 34.0 03/04/2015 1702   RDW 12.3 03/04/2015 1702   LYMPHSABS 2.3 03/04/2015 1702   MONOABS 0.7 03/04/2015 1702   EOSABS 0.3 03/04/2015 1702   BASOSABS 0.0 03/04/2015 1702    Hgb A1C No results found for: HGBA1C          Assessment & Plan:   Vaginal discharge, itching and irritation secondary to candida vaginitis:  Wet Prep: + yeast, no clue cells, no trich eRx for Diflucan 150 mg PO x 1, may repeat in 3 days if needed Urinalysis: normal  RTC as needed or if symptoms persist or worsen BAITY, REGINA, NP

## 2017-02-19 NOTE — Patient Instructions (Signed)

## 2017-02-22 DIAGNOSIS — F410 Panic disorder [episodic paroxysmal anxiety] without agoraphobia: Secondary | ICD-10-CM | POA: Diagnosis not present

## 2017-02-22 DIAGNOSIS — R69 Illness, unspecified: Secondary | ICD-10-CM | POA: Diagnosis not present

## 2017-02-22 DIAGNOSIS — F330 Major depressive disorder, recurrent, mild: Secondary | ICD-10-CM | POA: Diagnosis not present

## 2017-03-06 DIAGNOSIS — N3946 Mixed incontinence: Secondary | ICD-10-CM | POA: Diagnosis not present

## 2017-03-07 ENCOUNTER — Ambulatory Visit: Payer: Medicare HMO | Admitting: Obstetrics and Gynecology

## 2017-03-12 ENCOUNTER — Other Ambulatory Visit: Payer: Medicare HMO

## 2017-03-13 DIAGNOSIS — R69 Illness, unspecified: Secondary | ICD-10-CM | POA: Diagnosis not present

## 2017-03-13 DIAGNOSIS — F410 Panic disorder [episodic paroxysmal anxiety] without agoraphobia: Secondary | ICD-10-CM | POA: Diagnosis not present

## 2017-03-13 DIAGNOSIS — F330 Major depressive disorder, recurrent, mild: Secondary | ICD-10-CM | POA: Diagnosis not present

## 2017-03-16 ENCOUNTER — Ambulatory Visit (INDEPENDENT_AMBULATORY_CARE_PROVIDER_SITE_OTHER): Payer: Medicare HMO | Admitting: Family Medicine

## 2017-03-16 ENCOUNTER — Encounter: Payer: Self-pay | Admitting: Family Medicine

## 2017-03-16 VITALS — BP 132/74 | HR 71 | Temp 98.5°F | Wt 148.5 lb

## 2017-03-16 DIAGNOSIS — D1720 Benign lipomatous neoplasm of skin and subcutaneous tissue of unspecified limb: Secondary | ICD-10-CM | POA: Diagnosis not present

## 2017-03-16 DIAGNOSIS — Z1159 Encounter for screening for other viral diseases: Secondary | ICD-10-CM

## 2017-03-16 DIAGNOSIS — Z119 Encounter for screening for infectious and parasitic diseases, unspecified: Secondary | ICD-10-CM | POA: Diagnosis not present

## 2017-03-16 DIAGNOSIS — N761 Subacute and chronic vaginitis: Secondary | ICD-10-CM | POA: Diagnosis not present

## 2017-03-16 DIAGNOSIS — Z114 Encounter for screening for human immunodeficiency virus [HIV]: Secondary | ICD-10-CM

## 2017-03-16 DIAGNOSIS — R69 Illness, unspecified: Secondary | ICD-10-CM | POA: Diagnosis not present

## 2017-03-16 MED ORDER — FLUCONAZOLE 150 MG PO TABS: 150.0000 mg | ORAL_TABLET | Freq: Once | ORAL | 0 refills | Status: AC

## 2017-03-16 NOTE — Patient Instructions (Addendum)
This feels like a small lipoma.   These are non-cancerous fatty lesions in the skin.  If it gets bigger or painful or bother some you can get it cut out by dermatology or the surgeons.   You have to do anything about it in the meantime unless it bothers you.  Go to the lab on the way out.  We'll contact you with your lab report. Take care.  Glad to see you.

## 2017-03-16 NOTE — Progress Notes (Signed)
Knot on L thigh. Present for about 1 month.  Not painful.  No trauma.  No bruising, no drainage.  No redness.  No R leg sx.  No other similar lesions.  Some vaginal itching, better with diflucan but not resolved, wanted refill.  D/w pt.  Done at [facility].   Pt opts in for HIV screening.  D/w pt re: routine screening.   Pt opts in for HCV screening.  D/w pt re: routine screening.    She is following up with psychiatry routinely.   Meds, vitals, and allergies reviewed.   ROS: Per HPI unless specifically indicated in ROS section   Chaperoned exam. nad ncat Lesion noted on the left thigh. Not attached to the quad, it is mobile within the skin even when she is flexing her quadriceps. This feels rubbery and well circumscribed, typical for lipoma.

## 2017-03-16 NOTE — Progress Notes (Signed)
Pre visit review using our clinic review tool, if applicable. No additional management support is needed unless otherwise documented below in the visit note. 

## 2017-03-18 DIAGNOSIS — Z119 Encounter for screening for infectious and parasitic diseases, unspecified: Secondary | ICD-10-CM | POA: Insufficient documentation

## 2017-03-18 DIAGNOSIS — N760 Acute vaginitis: Secondary | ICD-10-CM | POA: Insufficient documentation

## 2017-03-18 DIAGNOSIS — D179 Benign lipomatous neoplasm, unspecified: Secondary | ICD-10-CM | POA: Insufficient documentation

## 2017-03-18 NOTE — Assessment & Plan Note (Signed)
See notes on labs. 

## 2017-03-18 NOTE — Assessment & Plan Note (Signed)
Okay to repeat Diflucan. Update [place] if not better.

## 2017-03-18 NOTE — Assessment & Plan Note (Signed)
Likely a lipoma. Reassured patient. Lipomas in general discussed with patient. Would only need intervention if bothersome to patient. She agrees.

## 2017-03-19 ENCOUNTER — Ambulatory Visit: Payer: Medicare HMO | Admitting: Psychology

## 2017-03-19 LAB — HIV ANTIBODY (ROUTINE TESTING W REFLEX): HIV 1&2 Ab, 4th Generation: NONREACTIVE

## 2017-03-20 ENCOUNTER — Encounter: Payer: Self-pay | Admitting: *Deleted

## 2017-03-20 LAB — HEPATITIS C ANTIBODY: HCV Ab: NEGATIVE

## 2017-03-29 DIAGNOSIS — L821 Other seborrheic keratosis: Secondary | ICD-10-CM | POA: Diagnosis not present

## 2017-03-29 DIAGNOSIS — L918 Other hypertrophic disorders of the skin: Secondary | ICD-10-CM | POA: Diagnosis not present

## 2017-03-29 DIAGNOSIS — L814 Other melanin hyperpigmentation: Secondary | ICD-10-CM | POA: Diagnosis not present

## 2017-03-29 DIAGNOSIS — D485 Neoplasm of uncertain behavior of skin: Secondary | ICD-10-CM | POA: Diagnosis not present

## 2017-03-29 DIAGNOSIS — X32XXXA Exposure to sunlight, initial encounter: Secondary | ICD-10-CM | POA: Diagnosis not present

## 2017-04-02 DIAGNOSIS — H900 Conductive hearing loss, bilateral: Secondary | ICD-10-CM | POA: Diagnosis not present

## 2017-04-02 DIAGNOSIS — H6123 Impacted cerumen, bilateral: Secondary | ICD-10-CM | POA: Diagnosis not present

## 2017-04-12 DIAGNOSIS — R69 Illness, unspecified: Secondary | ICD-10-CM | POA: Diagnosis not present

## 2017-04-12 DIAGNOSIS — F411 Generalized anxiety disorder: Secondary | ICD-10-CM | POA: Diagnosis not present

## 2017-04-18 DIAGNOSIS — N3946 Mixed incontinence: Secondary | ICD-10-CM | POA: Diagnosis not present

## 2017-04-24 DIAGNOSIS — F410 Panic disorder [episodic paroxysmal anxiety] without agoraphobia: Secondary | ICD-10-CM | POA: Diagnosis not present

## 2017-04-24 DIAGNOSIS — F330 Major depressive disorder, recurrent, mild: Secondary | ICD-10-CM | POA: Diagnosis not present

## 2017-04-24 DIAGNOSIS — R69 Illness, unspecified: Secondary | ICD-10-CM | POA: Diagnosis not present

## 2017-05-08 DIAGNOSIS — R350 Frequency of micturition: Secondary | ICD-10-CM | POA: Diagnosis not present

## 2017-05-08 DIAGNOSIS — N1339 Other hydronephrosis: Secondary | ICD-10-CM | POA: Diagnosis not present

## 2017-05-08 DIAGNOSIS — N3946 Mixed incontinence: Secondary | ICD-10-CM | POA: Diagnosis not present

## 2017-05-10 ENCOUNTER — Ambulatory Visit (INDEPENDENT_AMBULATORY_CARE_PROVIDER_SITE_OTHER): Payer: Medicare HMO | Admitting: Family Medicine

## 2017-05-10 ENCOUNTER — Encounter: Payer: Self-pay | Admitting: Family Medicine

## 2017-05-10 VITALS — BP 126/64 | HR 88 | Temp 98.7°F | Wt 145.5 lb

## 2017-05-10 DIAGNOSIS — R69 Illness, unspecified: Secondary | ICD-10-CM | POA: Diagnosis not present

## 2017-05-10 DIAGNOSIS — F419 Anxiety disorder, unspecified: Secondary | ICD-10-CM | POA: Diagnosis not present

## 2017-05-10 DIAGNOSIS — R0789 Other chest pain: Secondary | ICD-10-CM

## 2017-05-10 DIAGNOSIS — F329 Major depressive disorder, single episode, unspecified: Secondary | ICD-10-CM

## 2017-05-10 DIAGNOSIS — F32A Depression, unspecified: Secondary | ICD-10-CM

## 2017-05-10 NOTE — Assessment & Plan Note (Signed)
See above. Per psych.  I'll defer at this point.

## 2017-05-10 NOTE — Assessment & Plan Note (Signed)
Improving, benign exam, not likely that imaging would change plan so deferred.  Tylenol, ice, update me as needed.  She agrees.

## 2017-05-10 NOTE — Patient Instructions (Addendum)
See about docs/counselors in your network.  I can check the list if needed.   The rib pain should get better slowly.  Take tylenol and ice the area as needed.   Take care.  Glad to see you.

## 2017-05-10 NOTE — Progress Notes (Signed)
She was changed to cymbalta but had hair changes and was changed to effexor.  She just started higher dose, 150mg  a day.  She is a little nauseated with that medicine. No diarrhea.  "it's an uneasy feeling" in her stomach.  Gabapentin has helped her sleep recently.  She was asking about possibly getting with a psych clinic that was in network due to cost considerations.  I have all confidence in her current psych MD, d/w pt.   She has sig strain with caring for her husband.  She is in the midst of a divorce.  She is awaiting a ruling on that.    She was started on myrbetriq per urology.  No ADE on med.   She was stretching last week and felt a pop in the L chest wall. No bruising.  No trauma.  Sig pain on the L side initially, but better in the meantime.  Pain with rolling over, with a deep breath.  No fevers, no chills.  No cough.    Meds, vitals, and allergies reviewed.   ROS: Per HPI unless specifically indicated in ROS section   nad Flat affect at baseline but speech wnl and judgement intact ncat rrr ctab abd soft, not ttp L posterior chest wall ttp laterally along the ribs just lateral to the L mid-scapula w/o bruising.  No rash.

## 2017-05-18 DIAGNOSIS — F330 Major depressive disorder, recurrent, mild: Secondary | ICD-10-CM | POA: Diagnosis not present

## 2017-05-18 DIAGNOSIS — R69 Illness, unspecified: Secondary | ICD-10-CM | POA: Diagnosis not present

## 2017-05-18 DIAGNOSIS — F410 Panic disorder [episodic paroxysmal anxiety] without agoraphobia: Secondary | ICD-10-CM | POA: Diagnosis not present

## 2017-05-24 ENCOUNTER — Encounter: Payer: Self-pay | Admitting: General Surgery

## 2017-05-24 ENCOUNTER — Ambulatory Visit (INDEPENDENT_AMBULATORY_CARE_PROVIDER_SITE_OTHER): Payer: Medicare HMO | Admitting: General Surgery

## 2017-05-24 VITALS — BP 126/60 | HR 82 | Resp 14 | Ht 66.5 in | Wt 146.0 lb

## 2017-05-24 DIAGNOSIS — R229 Localized swelling, mass and lump, unspecified: Secondary | ICD-10-CM

## 2017-05-24 DIAGNOSIS — R0789 Other chest pain: Secondary | ICD-10-CM | POA: Diagnosis not present

## 2017-05-24 MED ORDER — CLOBETASOL PROPIONATE 0.05 % EX OINT: 1.0000 "application " | TOPICAL_OINTMENT | Freq: Two times a day (BID) | CUTANEOUS | 0 refills | Status: DC

## 2017-05-24 NOTE — Patient Instructions (Addendum)
Use a small dot of the steroid cream to the area twice daily.

## 2017-05-24 NOTE — Progress Notes (Signed)
Patient ID: Debra Little, female   DOB: 1956-02-13, 61 y.o.   MRN: [id number]  Chief Complaint  Patient presents with  . Other    left breast pain    HPI Debra Little is a 61 y.o. female here for evaluation of a red mark and pain in the left breast. This has been there for about a month. She states the area looks like a pimple and pain will shoot thru the area. She has used Neosporin to the area but it does not seem to cure it.  She states at times she cannot lay on her left side. She denies any changes in breast size. She states activity does not seem to bother it.  HPI  Past Medical History:  Diagnosis Date  . Anxiety    sees Dr. [name]  . Cyst of breast    per [name]  . Fibromyalgia   . Hemorrhoids   . Hiatal hernia   . Hyperlipidemia   . IBS (irritable bowel syndrome)    constipation predominant  . IC (interstitial cystitis)    per [facility]  . MVP (mitral valve prolapse)    [facility] cards eval 2012  . Vitamin D deficiency    history of    Past Surgical History:  Procedure Laterality Date  . ABDOMINAL HYSTERECTOMY    . BREAST EXCISIONAL BIOPSY Right   . BREAST EXCISIONAL BIOPSY Bilateral   . COLONOSCOPY  09/2014   Dr. [name]  . excision of breast cysts     hx of multiple cyst aspirations  . EXPLORATORY LAPAROTOMY    . TUBAL LIGATION      Family History  Problem Relation Age of Onset  . Cancer Mother        breast  . Cancer Father        colon    Social History Social History  Substance Use Topics  . Smoking status: Never Smoker  . Smokeless tobacco: Never Used  . Alcohol use No    Allergies  Allergen Reactions  . Gabapentin     Drowsy at high doses  . Bentyl [Dicyclomine Hcl]     Lack of effect  . Celecoxib     REACTION: unspecified  . Cortisone Other (See Comments)    flush  . Cymbalta [Duloxetine Hcl] Other (See Comments)    Hair loss/changes  . Doxycycline     Palpitations and chest pain  . Mobic [Meloxicam]      Palpitations.  But can tolerate aleve  . Nitrofurantoin Monohyd Macro   . Penicillins     REACTION: rash  . Sulfonamide Derivatives     REACTION: nausea  . Zoloft [Sertraline Hcl]     nausea and diarrhea.       Current Outpatient Prescriptions  Medication Sig Dispense Refill  . atorvastatin (LIPITOR) 40 MG tablet Take 40 mg by mouth daily.  1  . busPIRone (BUSPAR) 7.5 MG tablet Take 7.5 mg by mouth 3 (three) times daily as needed.    . cholecalciferol (VITAMIN D) 1000 units tablet Take 1,000 Units by mouth daily.    . clonazePAM (KLONOPIN) 0.5 MG tablet Take 0.5 mg by mouth 2 (two) times daily.     . fluticasone (FLONASE) 50 MCG/ACT nasal spray Place 2 sprays into both nostrils daily. (Patient taking differently: Place 2 sprays into both nostrils daily as needed. ) 16 g 1  . gabapentin (NEURONTIN) 100 MG capsule Take 1 capsule (100 mg total) by mouth 3 (three)  times daily as needed.    . loratadine (CLARITIN) 10 MG tablet Take 1 tablet (10 mg total) by mouth daily. (Patient taking differently: Take 10 mg by mouth daily as needed. )    . magnesium hydroxide (MILK OF MAGNESIA) 400 MG/5ML suspension Take by mouth daily as needed for mild constipation.    . Meth-Hyo-M Bl-Na Phos-Ph Sal (URIBEL) 118 MG CAPS As needed.    . mirabegron ER (MYRBETRIQ) 50 MG TB24 tablet Take 50 mg by mouth daily.    [name] omeprazole (PRILOSEC) 20 MG capsule Take 20 mg by mouth 2 (two) times daily before a meal. BRAND NAME ONLY    . progesterone (PROMETRIUM) 100 MG capsule Take 100 mg by mouth daily.    . traMADol (ULTRAM) 50 MG tablet Take 1 tablet (50 mg total) by mouth every 12 (twelve) hours as needed (for pain). 20 tablet 0  . valACYclovir (VALTREX) 500 MG tablet Take 1 tablet (500 mg total) by mouth daily. 30 tablet 6  . venlafaxine XR (EFFEXOR-XR) 150 MG 24 hr capsule Take 150 mg by mouth daily with breakfast.    . vitamin B-12 (CYANOCOBALAMIN) 1000 MCG tablet Take 1 tablet (1,000 mcg total) by mouth daily.     . clobetasol ointment (TEMOVATE) 5.70 % Apply 1 application topically 2 (two) times daily. 30 g 0   No current facility-administered medications for this visit.     Review of Systems Review of Systems  Constitutional: Negative.   Respiratory: Negative.   Cardiovascular: Negative.     Blood pressure 126/60, pulse 82, resp. rate 14, height 5' 6.5" (1.689 m), weight 146 lb (66.2 kg).  Physical Exam Physical Exam  Constitutional: She is oriented to person, place, and time. She appears well-developed and well-nourished.  Eyes: Conjunctivae are normal. No scleral icterus.  Neck: Neck supple.  Cardiovascular: Normal rate, regular rhythm and normal heart sounds.   Pulmonary/Chest: Effort normal and breath sounds normal. She exhibits tenderness. Right breast exhibits no inverted nipple, no mass, no nipple discharge, no skin change and no tenderness. Left breast exhibits no inverted nipple, no mass, no nipple discharge, no skin change and no tenderness.    3x4 mm raised red pimple along the intramammary fold of the left breast.   Lymphadenopathy:    She has no cervical adenopathy.    She has no axillary adenopathy.  Neurological: She is alert and oriented to person, place, and time.  Skin: Skin is warm and dry.  Psychiatric: She has a normal mood and affect.    Data Reviewed February 2018 mammograms and left breast ultrasound re-reviewed. No bearing on present exam. BI-RADS-2.  Assessment    Chest wall pain likely secondary to physical exertion.  Small skin nodule likely related to an inflammatory rather infectious process.    Plan    The patient will make use of clobetasol cream to the area twice a day. She's been asked to give a phone follow-up in 2 weeks.    HPI, Physical Exam, Assessment and Plan have been scribed under the direction and in the presence of Robert Bellow, MD  Concepcion Living, LPN  I have completed the exam and reviewed the above documentation for  accuracy and completeness.  I agree with the above.  Haematologist has been used and any errors in dictation or transcription are unintentional.  Hervey Ard, M.D., F.A.C.S.   Robert Bellow 05/25/2017, 8:31 PM

## 2017-05-25 DIAGNOSIS — R229 Localized swelling, mass and lump, unspecified: Secondary | ICD-10-CM | POA: Insufficient documentation

## 2017-06-08 DIAGNOSIS — F411 Generalized anxiety disorder: Secondary | ICD-10-CM | POA: Diagnosis not present

## 2017-06-08 DIAGNOSIS — R69 Illness, unspecified: Secondary | ICD-10-CM | POA: Diagnosis not present

## 2017-06-14 DIAGNOSIS — F411 Generalized anxiety disorder: Secondary | ICD-10-CM | POA: Diagnosis not present

## 2017-06-14 DIAGNOSIS — R69 Illness, unspecified: Secondary | ICD-10-CM | POA: Diagnosis not present

## 2017-06-22 ENCOUNTER — Telehealth: Payer: Self-pay

## 2017-06-22 NOTE — Telephone Encounter (Signed)
Spoke to pt. She will call back to schedule AWV and CPE.  NOTE* Never had AWV before

## 2017-06-22 NOTE — Telephone Encounter (Signed)
Patient is on the list for [employer] 2018 and may be a good candidate for an AWV. Please let me know if/when appt is scheduled.   

## 2017-06-27 DIAGNOSIS — I341 Nonrheumatic mitral (valve) prolapse: Secondary | ICD-10-CM | POA: Diagnosis not present

## 2017-06-28 DIAGNOSIS — R69 Illness, unspecified: Secondary | ICD-10-CM | POA: Diagnosis not present

## 2017-06-28 DIAGNOSIS — F410 Panic disorder [episodic paroxysmal anxiety] without agoraphobia: Secondary | ICD-10-CM | POA: Diagnosis not present

## 2017-06-28 DIAGNOSIS — F330 Major depressive disorder, recurrent, mild: Secondary | ICD-10-CM | POA: Diagnosis not present

## 2017-06-29 DIAGNOSIS — R69 Illness, unspecified: Secondary | ICD-10-CM | POA: Diagnosis not present

## 2017-07-03 ENCOUNTER — Telehealth: Payer: Self-pay

## 2017-07-03 NOTE — Telephone Encounter (Signed)
Patient called to give a 2 week phone follow up. She has been using the cream to the reddened pimple like area every day as prescribed. She states that the area looks the same as when you saw it, but she states that it no longer hurts. She would like to know if she should continue to use the cream or if you would like to see her back in the office.

## 2017-07-05 ENCOUNTER — Encounter: Payer: Self-pay | Admitting: Family Medicine

## 2017-07-05 LAB — LAB REPORT - SCANNED
ALT: 10
AST: 13
Creatinine, Ser: 0.88
Folate: 7
Glucose: 121
TSH: 1.3
VITAMIN B12: 1537

## 2017-07-06 NOTE — Telephone Encounter (Signed)
Pt declined AWV at this time. Will call back later if she gets interested.

## 2017-07-11 DIAGNOSIS — I341 Nonrheumatic mitral (valve) prolapse: Secondary | ICD-10-CM | POA: Diagnosis not present

## 2017-07-11 DIAGNOSIS — I059 Rheumatic mitral valve disease, unspecified: Secondary | ICD-10-CM | POA: Diagnosis not present

## 2017-07-11 DIAGNOSIS — E782 Mixed hyperlipidemia: Secondary | ICD-10-CM | POA: Diagnosis not present

## 2017-07-11 DIAGNOSIS — R002 Palpitations: Secondary | ICD-10-CM | POA: Diagnosis not present

## 2017-07-11 NOTE — Telephone Encounter (Signed)
The patient reports that the area is no longer tender to touch, but does tend to be irritated by her bra. Size has not changed. Redness faded somewhat but not completely resolved.  Options for management reviewed: 1) continued observation now that it is pain-free expecting its a dermal lesion and will become less symptomatic versus 2) excision for peace of mind. Recognizing that its location may make it uncomfortable to wear a bra for a week or so after excision.  The patient will consider her options and notify us how she died to proceed.

## 2017-07-11 NOTE — Telephone Encounter (Signed)
PATIENT CALLED IN TODAY TO CHECK ON MESSAGE LEFT ON 07-03-17.PLEASE ADVISE.

## 2017-07-12 DIAGNOSIS — R350 Frequency of micturition: Secondary | ICD-10-CM | POA: Diagnosis not present

## 2017-07-12 DIAGNOSIS — N3946 Mixed incontinence: Secondary | ICD-10-CM | POA: Diagnosis not present

## 2017-07-25 DIAGNOSIS — E782 Mixed hyperlipidemia: Secondary | ICD-10-CM | POA: Diagnosis not present

## 2017-08-01 DIAGNOSIS — H2513 Age-related nuclear cataract, bilateral: Secondary | ICD-10-CM | POA: Diagnosis not present

## 2017-08-20 DIAGNOSIS — Z800 Family history of malignant neoplasm of digestive organs: Secondary | ICD-10-CM | POA: Diagnosis not present

## 2017-08-20 DIAGNOSIS — Z86010 Personal history of colonic polyps: Secondary | ICD-10-CM | POA: Diagnosis not present

## 2017-08-20 DIAGNOSIS — R1013 Epigastric pain: Secondary | ICD-10-CM | POA: Diagnosis not present

## 2017-08-20 DIAGNOSIS — R1310 Dysphagia, unspecified: Secondary | ICD-10-CM | POA: Diagnosis not present

## 2017-08-20 DIAGNOSIS — K5909 Other constipation: Secondary | ICD-10-CM | POA: Diagnosis not present

## 2017-08-20 DIAGNOSIS — K219 Gastro-esophageal reflux disease without esophagitis: Secondary | ICD-10-CM | POA: Diagnosis not present

## 2017-08-24 DIAGNOSIS — R208 Other disturbances of skin sensation: Secondary | ICD-10-CM | POA: Diagnosis not present

## 2017-08-24 DIAGNOSIS — L538 Other specified erythematous conditions: Secondary | ICD-10-CM | POA: Diagnosis not present

## 2017-08-24 DIAGNOSIS — L820 Inflamed seborrheic keratosis: Secondary | ICD-10-CM | POA: Diagnosis not present

## 2017-09-04 DIAGNOSIS — R69 Illness, unspecified: Secondary | ICD-10-CM | POA: Diagnosis not present

## 2017-09-05 ENCOUNTER — Encounter: Payer: Self-pay | Admitting: Obstetrics and Gynecology

## 2017-09-05 ENCOUNTER — Ambulatory Visit (INDEPENDENT_AMBULATORY_CARE_PROVIDER_SITE_OTHER): Payer: Medicare HMO | Admitting: Obstetrics and Gynecology

## 2017-09-05 VITALS — BP 128/72 | HR 84 | Ht 66.5 in | Wt 142.0 lb

## 2017-09-05 DIAGNOSIS — R69 Illness, unspecified: Secondary | ICD-10-CM | POA: Diagnosis not present

## 2017-09-05 DIAGNOSIS — Z79890 Hormone replacement therapy (postmenopausal): Secondary | ICD-10-CM | POA: Diagnosis not present

## 2017-09-05 DIAGNOSIS — N951 Menopausal and female climacteric states: Secondary | ICD-10-CM | POA: Diagnosis not present

## 2017-09-05 DIAGNOSIS — A6004 Herpesviral vulvovaginitis: Secondary | ICD-10-CM | POA: Diagnosis not present

## 2017-09-05 MED ORDER — VALACYCLOVIR HCL 500 MG PO TABS: 500.0000 mg | ORAL_TABLET | Freq: Every day | ORAL | 6 refills | Status: DC

## 2017-09-05 MED ORDER — PROGESTERONE MICRONIZED 100 MG PO CAPS: 100.0000 mg | ORAL_CAPSULE | Freq: Every day | ORAL | 6 refills | Status: DC

## 2017-09-05 NOTE — Progress Notes (Signed)
PCP: Tonia Ghent, MD   Chief Complaint  Patient presents with  . Gynecologic Exam    No Complaints. Last pap 08/16/16 Neg; HRT f/u (Medicare)   Had medicare annual 9/17  HPI:      Ms. Debra Little is a 61 y.o. G1P1 who LMP was No LMP recorded. Patient has had a hysterectomy., presents today for HRT f/u.  Her menses are absent due to [facility] 2002 for endometriosis, and she is postmenopausal. She does not have intermenstrual bleeding.  She does not have vasomotor sx. She uses prometrium 100 mg caps 6 nights on, 1 night off. Sx well controlled, but can be a little worse under stress or hot temps.  Sex activity: single partner, contraception - post menopausal status. She does not have vaginal dryness.  Last Pap: 08/16/16 Results were: no abnormalities ; neg HPV DNA 2015. Hx of HSV, takes valtrex daily. No recent outbreaks.  Last mammogram: January 19, 2017  Results were: normal--routine follow-up in 12 months There is a FH of breast cancer in her mother. There is a FH of ovarian cancer in her mat aunt. Pt's ins denied cancer genetic testing in the past and now she has medicare, but she doesn't meet their testing guidelines. The patient does do self-breast exams.  Colonoscopy: colonoscopy 3 years ago with abnormalities. . Repeat due after 3 years. Has sched in a couple wks, along with upper GI. DEXA: 10/17--osteopenia in spine/fem neck.  Lab with PCP.  Past Medical History:  Diagnosis Date  . Anxiety    sees Dr. [name]  . Cyst of breast    per [name]  . Fibromyalgia   . Hemorrhoids   . Hiatal hernia   . Hyperlipidemia   . IBS (irritable bowel syndrome)    constipation predominant  . IC (interstitial cystitis)    per [facility]  . MVP (mitral valve prolapse)    [facility] cards eval 2012  . Vitamin D deficiency    history of    Past Surgical History:  Procedure Laterality Date  . ABDOMINAL HYSTERECTOMY    . BREAST EXCISIONAL BIOPSY Right   . BREAST  EXCISIONAL BIOPSY Bilateral   . COLONOSCOPY  09/2014   Dr. [name]  . excision of breast cysts     hx of multiple cyst aspirations  . EXPLORATORY LAPAROTOMY    . TUBAL LIGATION      Family History  Problem Relation Age of Onset  . Breast cancer Mother 81  . Colon cancer Father 60  . Ovarian cancer Maternal Aunt 80  . Breast cancer Cousin 17  . Gallbladder disease Paternal Grandmother     Social History   Social History  . Marital status: Married    Spouse name: N/A  . Number of children: 1  . Years of education: N/A   Occupational History  . [employer]    Social History Main Topics  . Smoking status: Never Smoker  . Smokeless tobacco: Never Used  . Alcohol use No  . Drug use: No  . Sexual activity: Not on file   Other Topics Concern  . Not on file   Social History Narrative   Married in 1998, 1 son from previous relationship   Brother with MVA at 23, in rest home as of 2011.   Current Meds  Medication Sig  . DULoxetine (CYMBALTA) 60 MG capsule Take 60 mg by mouth daily.    ROS:  Review of Systems  Constitutional: Negative for fever.  Gastrointestinal: Positive for constipation. Negative for blood in stool, diarrhea, nausea and vomiting.  Genitourinary: Positive for frequency. Negative for dyspareunia, dysuria, flank pain, hematuria, urgency, vaginal bleeding, vaginal discharge and vaginal pain.  Musculoskeletal: Negative for back pain.  Skin: Negative for rash.     Objective: BP 128/72 (BP Location: Left Arm, Patient Position: Sitting, Cuff Size: Normal)   Pulse 84   Ht 5' 6.5" (1.689 m)   Wt 142 lb (64.4 kg)   BMI 22.58 kg/m    Physical Exam  Constitutional: She is oriented to person, place, and time. She appears well-developed.  Neurological: She is alert and oriented to person, place, and time.  Psychiatric: She has a normal mood and affect. Her behavior is normal. Thought content normal.  Vitals reviewed.   Assessment/Plan:  Hormone  replacement therapy (HRT) - Sx controlled with prog 100 mg 6 nights on, 1 night off. Will decrease to 100 mg QOHS (can't split caps in half). Can go back to daily dosing prn. Rx RF. F/u pr - Plan: progesterone (PROMETRIUM) 100 MG capsule  Vasomotor symptoms due to menopause - Discussed weaning down over time to get off HRT - Plan: progesterone (PROMETRIUM) 100 MG capsule  Herpes simplex vulvovaginitis - Rx RF valtrex.  - Plan: valACYclovir (VALTREX) 500 MG tablet   Meds ordered this encounter  Medications  . DULoxetine (CYMBALTA) 60 MG capsule    Sig: Take 60 mg by mouth daily.  . valACYclovir (VALTREX) 500 MG tablet    Sig: Take 1 tablet (500 mg total) by mouth daily. 6 nights on, 1 night off    Dispense:  30 tablet    Refill:  6  . progesterone (PROMETRIUM) 100 MG capsule    Sig: Take 1 capsule (100 mg total) by mouth daily.    Dispense:  30 capsule    Refill:  6            GYN counsel mammography screening, use and side effects of HRT, diet and exercise    F/U  Return in about 1 year (around 09/05/2018) for Medicare pelvic/breast.  Elmo Putt B. Copland, PA-C 09/05/2017 11:50 AM

## 2017-09-11 DIAGNOSIS — R69 Illness, unspecified: Secondary | ICD-10-CM | POA: Diagnosis not present

## 2017-09-11 DIAGNOSIS — F330 Major depressive disorder, recurrent, mild: Secondary | ICD-10-CM | POA: Diagnosis not present

## 2017-09-11 DIAGNOSIS — F410 Panic disorder [episodic paroxysmal anxiety] without agoraphobia: Secondary | ICD-10-CM | POA: Diagnosis not present

## 2017-09-20 DIAGNOSIS — D1339 Benign neoplasm of other parts of small intestine: Secondary | ICD-10-CM | POA: Diagnosis not present

## 2017-09-20 DIAGNOSIS — R1310 Dysphagia, unspecified: Secondary | ICD-10-CM | POA: Diagnosis not present

## 2017-09-20 DIAGNOSIS — K5730 Diverticulosis of large intestine without perforation or abscess without bleeding: Secondary | ICD-10-CM | POA: Diagnosis not present

## 2017-09-20 DIAGNOSIS — K319 Disease of stomach and duodenum, unspecified: Secondary | ICD-10-CM | POA: Diagnosis not present

## 2017-09-20 DIAGNOSIS — D124 Benign neoplasm of descending colon: Secondary | ICD-10-CM | POA: Diagnosis not present

## 2017-09-20 DIAGNOSIS — D1330 Benign neoplasm of unspecified part of small intestine: Secondary | ICD-10-CM | POA: Diagnosis not present

## 2017-09-20 DIAGNOSIS — D132 Benign neoplasm of duodenum: Secondary | ICD-10-CM | POA: Diagnosis not present

## 2017-09-20 DIAGNOSIS — K648 Other hemorrhoids: Secondary | ICD-10-CM | POA: Diagnosis not present

## 2017-09-20 DIAGNOSIS — K640 First degree hemorrhoids: Secondary | ICD-10-CM | POA: Diagnosis not present

## 2017-09-20 DIAGNOSIS — D125 Benign neoplasm of sigmoid colon: Secondary | ICD-10-CM | POA: Diagnosis not present

## 2017-09-20 DIAGNOSIS — D126 Benign neoplasm of colon, unspecified: Secondary | ICD-10-CM | POA: Diagnosis not present

## 2017-09-20 DIAGNOSIS — Z86010 Personal history of colonic polyps: Secondary | ICD-10-CM | POA: Diagnosis not present

## 2017-09-20 DIAGNOSIS — K449 Diaphragmatic hernia without obstruction or gangrene: Secondary | ICD-10-CM | POA: Diagnosis not present

## 2017-09-20 DIAGNOSIS — K3189 Other diseases of stomach and duodenum: Secondary | ICD-10-CM | POA: Diagnosis not present

## 2017-09-28 LAB — HM COLONOSCOPY

## 2017-10-04 DIAGNOSIS — H900 Conductive hearing loss, bilateral: Secondary | ICD-10-CM | POA: Diagnosis not present

## 2017-10-04 DIAGNOSIS — H6123 Impacted cerumen, bilateral: Secondary | ICD-10-CM | POA: Diagnosis not present

## 2017-11-08 DIAGNOSIS — R69 Illness, unspecified: Secondary | ICD-10-CM | POA: Diagnosis not present

## 2017-11-08 DIAGNOSIS — F410 Panic disorder [episodic paroxysmal anxiety] without agoraphobia: Secondary | ICD-10-CM | POA: Diagnosis not present

## 2017-11-08 DIAGNOSIS — F330 Major depressive disorder, recurrent, mild: Secondary | ICD-10-CM | POA: Diagnosis not present

## 2017-11-14 ENCOUNTER — Telehealth: Payer: Self-pay | Admitting: Family Medicine

## 2017-11-14 ENCOUNTER — Ambulatory Visit (INDEPENDENT_AMBULATORY_CARE_PROVIDER_SITE_OTHER): Payer: Medicare HMO | Admitting: Family Medicine

## 2017-11-14 ENCOUNTER — Encounter: Payer: Self-pay | Admitting: Family Medicine

## 2017-11-14 VITALS — BP 120/78 | HR 82 | Temp 98.8°F | Wt 144.0 lb

## 2017-11-14 DIAGNOSIS — F32A Depression, unspecified: Secondary | ICD-10-CM

## 2017-11-14 DIAGNOSIS — F329 Major depressive disorder, single episode, unspecified: Secondary | ICD-10-CM

## 2017-11-14 DIAGNOSIS — J0100 Acute maxillary sinusitis, unspecified: Secondary | ICD-10-CM

## 2017-11-14 DIAGNOSIS — F419 Anxiety disorder, unspecified: Secondary | ICD-10-CM

## 2017-11-14 MED ORDER — AZITHROMYCIN 250 MG PO TABS: ORAL_TABLET | ORAL | 0 refills | Status: DC

## 2017-11-14 NOTE — Telephone Encounter (Signed)
TC to pharmacy. They have the patient's prescription ready.  TC to patient, left voicemail stating [facility].

## 2017-11-14 NOTE — Progress Notes (Signed)
She sold her house and is moving out, living with a friend in the meantime.  D/w pt.    She has seen psych in the meantime.  Her brother has ongoing needs and there was sig stressors with her husband, d/w pt.  She still has sig anxiety and was prev/is still on klonopin.  She couldn't tolerate buspar.  She is still on cymbalta still.  I asked patient to discuss this with her psych MD and update me as needed.    Cough and congestion.  For about 3 weeks.  Voice is hoarse.  Taking otc cough meds and nasacort robitussin.  No known fevers but sometimes felt a little hot.  Discolored sputum and rhinorrhea from the L nare.  Sig congestion.  More sputum in the AM.  Facial pain.  Tooth pain noted.    She had EGD and she had questions about her pathology report.  I asked her to follow-up with GI with her questions. She agreed to do so.    Meds, vitals, and allergies reviewed.   ROS: Per HPI unless specifically indicated in ROS section   GEN: nad, alert and oriented HEENT: mucous membranes moist, tm w/o erythema, nasal exam w/o erythema, clear discharge noted,  OP with cobblestoning, max sinuses ttp B, L>R.  NECK: supple w/o LA CV: rrr.   PULM: ctab, no inc wob EXT: no edema SKIN: no acute rash  See AVS.

## 2017-11-14 NOTE — Telephone Encounter (Signed)
Copied from [place] [id number]. Topic: Quick Communication - Rx Refill/Question >> Nov 14, 2017  3:38 PM Arletha Grippe wrote: Has the patient contacted their pharmacy? Yes.     (Agent: If no, request that the patient contact the pharmacy for the refill.)   Preferred Pharmacy (with phone number or street name): [facility] and [facility] were told that the pharmacy would not have it ready until tomorrow due to not having enough help. Pt is wanting rx to be sent to  instead.  cb # [phone] (ZITHROMAX) 250 MG tablet     Agent: Please be advised that RX refills may take up to 3 business days. We ask that you follow-up with your pharmacy.

## 2017-11-14 NOTE — Patient Instructions (Addendum)
Start zithromax and update me as needed.   Keep taking the OTC cough meds and drink plenty of fluids.  Call GI about follow up. I'll check your report in the meantime.   Talk to the psychiatry clinic about med options.  Take care.  Glad to see you.

## 2017-11-15 DIAGNOSIS — J0100 Acute maxillary sinusitis, unspecified: Secondary | ICD-10-CM | POA: Insufficient documentation

## 2017-11-15 NOTE — Assessment & Plan Note (Signed)
See above, see after visit summary.  I asked her to follow-up with psychiatry.

## 2017-11-15 NOTE — Assessment & Plan Note (Signed)
Nontoxic, supportive care, start Zithromax, routine cautions given.  Update me as needed.  She agrees.

## 2017-12-12 ENCOUNTER — Other Ambulatory Visit: Payer: Self-pay

## 2017-12-12 DIAGNOSIS — Z1231 Encounter for screening mammogram for malignant neoplasm of breast: Secondary | ICD-10-CM

## 2017-12-24 ENCOUNTER — Telehealth: Payer: Self-pay | Admitting: Family Medicine

## 2017-12-24 NOTE — Telephone Encounter (Signed)
GI notes from 09/28/17 pasted below.    Dr. [name] has finalized pathology and per RTE:  "Tissue in Duodenum not harmful and no repeat EGD needed"   Polyps removed were adenomatous and will repeat colonoscopy in 5 years for follow up.   Spoke with patient and advised of Colon/EGD results. Advised that she will receive a result letter detailing this information as well. Advised that our office will contact her in 5 years when due for a repeat procedure. Advised patient to contact our office with any questions or concerns related to her GI care. She verbalized understanding to all information provided today.

## 2018-01-04 ENCOUNTER — Ambulatory Visit: Payer: Self-pay

## 2018-01-04 NOTE — Telephone Encounter (Signed)
Patient advised.

## 2018-01-04 NOTE — Telephone Encounter (Signed)
Pt c/o neck pain located to the back of the neck. States its a 7/10 and describes it as aching. Pt also c/o left upper arm pain that ends at the elbow. She describes that as an ache. She states she had taken some tramadol that was rx in 2017. She states she has tried Acetaminophen and naproxen and nothing has helped with the pain. Pt denies weakness to either arm. Pt thinks a fall a few weeks ago may have contributed to her neck pain coming back.  Pt requesting Dr Damita Dunnings to call her [facility]. Pt refused to make appt.   Reason for Disposition . [1] MODERATE neck pain (e.g., interferes with normal activities AND [2] present > 3 days . Neck pain present > 2 weeks  Additional Information . Commented on: Pain shoots (radiates) into arm or hand    Shoots to left elbow  Answer Assessment - Initial Assessment Questions 1. ONSET: "When did the pain begin?"      3 weeks ago 2. LOCATION: "Where does it hurt?"      Neck pain 3. PATTERN "Does the pain come and go, or has it been constant since it started?"      Occasionally the pain will go away of pt gets in a comfortable position 4. SEVERITY: "How bad is the pain?"  (Scale 1-10; or mild, moderate, severe)   - MILD (1-3): doesn't interfere with normal activities    - MODERATE (4-7): interferes with normal activities or awakens from sleep    - SEVERE (8-10):  excruciating pain, unable to do any normal activities      7/10 5. RADIATION: "Does the pain go anywhere else, shoot into your arms?"     Shoots to left elbow- states pain feels achy 6. CORD SYMPTOMS: "Any weakness or numbness of the arms or legs?"     no 7. CAUSE: "What do you think is causing the neck pain?"     Pt states she has a h/o neck pain but states she fell a few weeks ago and that is when pain started, states she has been packing for a move and has been under a lot of stress lately 8. NECK OVERUSE: "Any recent activities that involved turning or  twisting the neck?"     no 9. OTHER SYMPTOMS: "Do you have any other symptoms?" (e.g., headache, fever, chest pain, difficulty breathing, neck swelling)     no 10. PREGNANCY: "Is there any chance you are pregnant?" "When was your last menstrual period?"       n/a  Protocols used: NECK PAIN OR STIFFNESS-A-AH

## 2018-01-04 NOTE — Telephone Encounter (Signed)
Needs OV/UC/sat clinic.  I didn't call in med.

## 2018-01-07 ENCOUNTER — Encounter: Payer: Self-pay | Admitting: Family Medicine

## 2018-01-07 ENCOUNTER — Ambulatory Visit (INDEPENDENT_AMBULATORY_CARE_PROVIDER_SITE_OTHER): Payer: PPO | Admitting: Family Medicine

## 2018-01-07 VITALS — BP 140/78 | HR 52 | Temp 98.4°F | Wt 141.5 lb

## 2018-01-07 DIAGNOSIS — M542 Cervicalgia: Secondary | ICD-10-CM

## 2018-01-07 MED ORDER — METHOCARBAMOL 500 MG PO TABS: 500.0000 mg | ORAL_TABLET | Freq: Four times a day (QID) | ORAL | 0 refills | Status: DC

## 2018-01-07 MED ORDER — PREDNISONE 10 MG PO TABS: ORAL_TABLET | ORAL | 0 refills | Status: DC

## 2018-01-07 NOTE — Patient Instructions (Signed)
Please start prednisone today- with a meal and full glass of water, no Alleve/Excedrin/Ibuprofen with this  Do gentle range of motion exercises 3-4 times a day, use after heat  If not better in a couple of days, please follow up with Dr. Damita Dunnings

## 2018-01-07 NOTE — Progress Notes (Signed)
Subjective:    Patient ID: Debra Little, female    DOB: [birth year], 62 y.o.   MRN: [id number]  HPI Patient presents today with 6 days of neck pain. Has had neck pain in the past, saw chiropractor, has appointment with her chiropractor today.  She fell about a month ago, no pain then, about a week later, she awoke with a neck pain which resolved.  Has been using heating pad and used some old Tramadol. No relief with Alleve or Tylenol, some relief with Excedrin. Some radiation down left elbow. If she is sitting still, no pain, but pain with getting up and lying down. Some tingling in left elbow. No weakness in arms or legs. No bowel or bladder changes. No visual changes.   Chronic constipation, takes Linzess PRN.     Past Medical History:  Diagnosis Date  . Anxiety    sees Dr. [name]  . Cyst of breast    per [name]  . Fibromyalgia   . Hemorrhoids   . Hiatal hernia   . Hyperlipidemia   . IBS (irritable bowel syndrome)    constipation predominant  . IC (interstitial cystitis)    per [place] [facility]  . MVP (mitral valve prolapse)    [facility] cards eval 2012  . Vitamin D deficiency    history of   Past Surgical History:  Procedure Laterality Date  . ABDOMINAL HYSTERECTOMY    . BREAST EXCISIONAL BIOPSY Right   . BREAST EXCISIONAL BIOPSY Bilateral   . COLONOSCOPY  09/2014   Dr. [name]  . excision of breast cysts     hx of multiple cyst aspirations  . EXPLORATORY LAPAROTOMY    . TUBAL LIGATION     Family History  Problem Relation Age of Onset  . Breast cancer Mother 48  . Colon cancer Father 44  . Ovarian cancer Maternal Aunt 80  . Breast cancer Cousin 27  . Gallbladder disease Paternal Grandmother    Social History   Tobacco Use  . Smoking status: Never Smoker  . Smokeless tobacco: Never Used  Substance Use Topics  . Alcohol use: No    Alcohol/week: 0.0 oz  . Drug use: No      Review of Systems Per HPI    Objective:   Physical Exam    Constitutional: She is oriented to person, place, and time. She appears well-developed and well-nourished. No distress.  HENT:  Head: Normocephalic and atraumatic.  Mouth/Throat: Oropharynx is clear and moist. No oropharyngeal exudate.  Eyes: Conjunctivae and EOM are normal. Pupils are equal, round, and reactive to light. Right eye exhibits no discharge. Left eye exhibits no discharge.  Neck: Normal range of motion. Neck supple.  Cardiovascular: Regular rhythm and normal heart sounds.  Pulmonary/Chest: Effort normal and breath sounds normal.  Musculoskeletal:       Cervical back: She exhibits tenderness (left trapezius). She exhibits normal range of motion, no bony tenderness, no swelling and no deformity.  Lymphadenopathy:    She has no cervical adenopathy.  Neurological: She is alert and oriented to person, place, and time. No cranial nerve deficit. Coordination normal.  Gait normal. UE/LE strength 5/5.   Skin: Skin is warm and dry. She is not diaphoretic.  Psychiatric: She has a normal mood and affect. Her behavior is normal. Judgment and thought content normal.  Vitals reviewed.     BP 140/78   Pulse (!) 52   Temp 98.4 F (36.9 C) (Oral)   Wt 141 lb 8  oz (64.2 kg)   SpO2 98%   BMI 22.50 kg/m  Wt Readings from Last 3 Encounters:  01/07/18 141 lb 8 oz (64.2 kg)  11/14/17 144 lb (65.3 kg)  09/05/17 142 lb (64.4 kg)       Assessment & Plan:  1. Neck pain - Discussed avoiding tramadol since she is on duloxetine and combination can cause serotonin syndrome.  - methocarbamol (ROBAXIN) 500 MG tablet; Take 1 tablet (500 mg total) by mouth 4 (four) times daily.  Dispense: 20 tablet; Refill: 0 - predniSONE (DELTASONE) 10 MG tablet; Take 4 x 2 days, 3 x 2 days, 2 x 2 days, 1 x 2 days  Dispense: 20 tablet; Refill: 0 - encouraged her to use heat and do gentle ROM several times a day - RTC precautions reviewed  Clarene Reamer, FNP-BC  [facility], [facility]  01/08/2018 8:28 AM

## 2018-01-08 ENCOUNTER — Encounter: Payer: Self-pay | Admitting: Family Medicine

## 2018-01-08 DIAGNOSIS — M542 Cervicalgia: Secondary | ICD-10-CM | POA: Diagnosis not present

## 2018-01-08 DIAGNOSIS — M25512 Pain in left shoulder: Secondary | ICD-10-CM | POA: Diagnosis not present

## 2018-01-08 DIAGNOSIS — M5033 Other cervical disc degeneration, cervicothoracic region: Secondary | ICD-10-CM | POA: Diagnosis not present

## 2018-01-08 DIAGNOSIS — R202 Paresthesia of skin: Secondary | ICD-10-CM | POA: Diagnosis not present

## 2018-01-08 DIAGNOSIS — M9901 Segmental and somatic dysfunction of cervical region: Secondary | ICD-10-CM | POA: Diagnosis not present

## 2018-01-08 DIAGNOSIS — M791 Myalgia: Secondary | ICD-10-CM | POA: Diagnosis not present

## 2018-01-08 DIAGNOSIS — M5412 Radiculopathy, cervical region: Secondary | ICD-10-CM | POA: Diagnosis not present

## 2018-01-09 ENCOUNTER — Encounter: Payer: Self-pay | Admitting: Family Medicine

## 2018-01-09 ENCOUNTER — Ambulatory Visit (INDEPENDENT_AMBULATORY_CARE_PROVIDER_SITE_OTHER): Payer: PPO | Admitting: Family Medicine

## 2018-01-09 DIAGNOSIS — M792 Neuralgia and neuritis, unspecified: Secondary | ICD-10-CM | POA: Diagnosis not present
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
## 2018-01-09 NOTE — Patient Instructions (Signed)
Gradually increase the gabapentin up to 300mg  per dose, up to 3 times a day.   Take 1-1-2---> 1-1-3, etc.   Update me if this isn't getting better so we can consider an xray or other options.  Take care.  Glad to see you.

## 2018-01-09 NOTE — Assessment & Plan Note (Signed)
This sounds to be radicular.  She does not have typical carpal tunnel symptoms.  Discussed with patient about imaging if her symptoms do not improve.  Likely reasonable to try higher dose of gabapentin, up to 300 mg 3 times a day.  Discussed with patient about slow increase of dose.  Would not use opiates as the primary pain medication in this situation.  Discussed.  Update me as needed.  Okay to continue with chiropractic treatment.

## 2018-01-10 ENCOUNTER — Encounter: Payer: Self-pay | Admitting: Family Medicine

## 2018-01-10 DIAGNOSIS — E782 Mixed hyperlipidemia: Secondary | ICD-10-CM | POA: Diagnosis not present

## 2018-01-10 DIAGNOSIS — I341 Nonrheumatic mitral (valve) prolapse: Secondary | ICD-10-CM | POA: Diagnosis not present

## 2018-01-10 DIAGNOSIS — R202 Paresthesia of skin: Secondary | ICD-10-CM | POA: Diagnosis not present

## 2018-01-10 DIAGNOSIS — M9901 Segmental and somatic dysfunction of cervical region: Secondary | ICD-10-CM | POA: Diagnosis not present

## 2018-01-10 DIAGNOSIS — M5033 Other cervical disc degeneration, cervicothoracic region: Secondary | ICD-10-CM | POA: Diagnosis not present

## 2018-01-10 DIAGNOSIS — M5412 Radiculopathy, cervical region: Secondary | ICD-10-CM | POA: Diagnosis not present

## 2018-01-10 DIAGNOSIS — M791 Myalgia: Secondary | ICD-10-CM | POA: Diagnosis not present

## 2018-01-10 DIAGNOSIS — M7918 Myalgia, other site: Secondary | ICD-10-CM | POA: Diagnosis not present

## 2018-01-10 DIAGNOSIS — M542 Cervicalgia: Secondary | ICD-10-CM | POA: Diagnosis not present

## 2018-01-10 DIAGNOSIS — M25512 Pain in left shoulder: Secondary | ICD-10-CM | POA: Diagnosis not present

## 2018-01-10 DIAGNOSIS — R002 Palpitations: Secondary | ICD-10-CM | POA: Diagnosis not present

## 2018-01-11 ENCOUNTER — Ambulatory Visit (INDEPENDENT_AMBULATORY_CARE_PROVIDER_SITE_OTHER)
Admission: RE | Admit: 2018-01-11 | Discharge: 2018-01-11 | Disposition: A | Discharge status: Home / Self Care | Payer: PPO | Source: Ambulatory Visit | Attending: Family Medicine | Admitting: Family Medicine

## 2018-01-11 ENCOUNTER — Telehealth: Payer: Self-pay | Admitting: Family Medicine

## 2018-01-11 DIAGNOSIS — M7918 Myalgia, other site: Secondary | ICD-10-CM | POA: Diagnosis not present

## 2018-01-11 DIAGNOSIS — M5412 Radiculopathy, cervical region: Secondary | ICD-10-CM | POA: Diagnosis not present

## 2018-01-11 DIAGNOSIS — M9901 Segmental and somatic dysfunction of cervical region: Secondary | ICD-10-CM | POA: Diagnosis not present

## 2018-01-11 DIAGNOSIS — R002 Palpitations: Secondary | ICD-10-CM | POA: Diagnosis not present

## 2018-01-11 DIAGNOSIS — R202 Paresthesia of skin: Secondary | ICD-10-CM | POA: Diagnosis not present

## 2018-01-11 DIAGNOSIS — M791 Myalgia: Secondary | ICD-10-CM | POA: Diagnosis not present

## 2018-01-11 DIAGNOSIS — M792 Neuralgia and neuritis, unspecified: Secondary | ICD-10-CM

## 2018-01-11 DIAGNOSIS — M25512 Pain in left shoulder: Secondary | ICD-10-CM | POA: Diagnosis not present

## 2018-01-11 DIAGNOSIS — M542 Cervicalgia: Secondary | ICD-10-CM | POA: Diagnosis not present

## 2018-01-11 DIAGNOSIS — M5033 Other cervical disc degeneration, cervicothoracic region: Secondary | ICD-10-CM | POA: Diagnosis not present

## 2018-01-11 NOTE — Telephone Encounter (Signed)
Patient advised.

## 2018-01-11 NOTE — Telephone Encounter (Signed)
Copied from [place]. Topic: Inquiry >> Jan 11, 2018 12:35 PM Conception Chancy, NT wrote: Patient states she was seen on 01/09/18 for her neck pain and states she is still not feeling better and is wondering if she needs a MRI done. She would like a call back on the next step.

## 2018-01-11 NOTE — Telephone Encounter (Signed)
Needs xray first.  Ordered.  Thanks.

## 2018-01-13 ENCOUNTER — Other Ambulatory Visit: Payer: Self-pay | Admitting: Family Medicine

## 2018-01-13 DIAGNOSIS — M5412 Radiculopathy, cervical region: Secondary | ICD-10-CM

## 2018-01-14 ENCOUNTER — Telehealth: Payer: Self-pay | Admitting: Family Medicine

## 2018-01-14 ENCOUNTER — Ambulatory Visit
Admission: RE | Admit: 2018-01-14 | Discharge: 2018-01-14 | Disposition: A | Discharge status: Home / Self Care | Payer: PPO | Source: Ambulatory Visit | Attending: General Surgery | Admitting: General Surgery

## 2018-01-14 DIAGNOSIS — R202 Paresthesia of skin: Secondary | ICD-10-CM | POA: Diagnosis not present

## 2018-01-14 DIAGNOSIS — M9901 Segmental and somatic dysfunction of cervical region: Secondary | ICD-10-CM | POA: Diagnosis not present

## 2018-01-14 DIAGNOSIS — M5033 Other cervical disc degeneration, cervicothoracic region: Secondary | ICD-10-CM | POA: Diagnosis not present

## 2018-01-14 DIAGNOSIS — M5412 Radiculopathy, cervical region: Secondary | ICD-10-CM | POA: Diagnosis not present

## 2018-01-14 DIAGNOSIS — Z1231 Encounter for screening mammogram for malignant neoplasm of breast: Secondary | ICD-10-CM

## 2018-01-14 DIAGNOSIS — M25512 Pain in left shoulder: Secondary | ICD-10-CM | POA: Diagnosis not present

## 2018-01-14 DIAGNOSIS — M542 Cervicalgia: Secondary | ICD-10-CM | POA: Diagnosis not present

## 2018-01-14 DIAGNOSIS — M7918 Myalgia, other site: Secondary | ICD-10-CM | POA: Diagnosis not present

## 2018-01-14 MED ORDER — HYDROCODONE-ACETAMINOPHEN 5-325 MG PO TABS: 0.5000 | ORAL_TABLET | Freq: Three times a day (TID) | ORAL | 0 refills | Status: DC | PRN

## 2018-01-14 NOTE — Telephone Encounter (Signed)
Copied from [place]. Topic: Quick Communication - See Telephone Encounter >> Jan 14, 2018  2:04 PM Vernona Rieger wrote: CRM for notification. See Telephone encounter for:   01/14/18.  Pt said that Dr Damita Dunnings advised her to up her dosage on her gabapentin. She said she has been doing that & is still in a lot of pain. Her MRI is not until next week. Her neck, shoulder & left arm is still hurting. She wants to know what else Dr Damita Dunnings would like her to do to help with the pain until the MRI. Call back is [phone]

## 2018-01-14 NOTE — Telephone Encounter (Signed)
Patient advised.

## 2018-01-14 NOTE — Telephone Encounter (Signed)
Pt uses [facility] [place] and request cb when med sent to pharmacy.

## 2018-01-14 NOTE — Telephone Encounter (Signed)
Sent hydrocodone, sedation caution.  Update [place] as needed. Thanks.

## 2018-01-17 ENCOUNTER — Telehealth: Payer: Self-pay | Admitting: *Deleted

## 2018-01-17 MED ORDER — GABAPENTIN 100 MG PO CAPS: 100.0000 mg | ORAL_CAPSULE | Freq: Three times a day (TID) | ORAL | 1 refills | Status: DC | PRN

## 2018-01-17 NOTE — Addendum Note (Signed)
Addended by: Tonia Ghent on: 01/17/2018 02:03 PM   Modules accepted: Orders

## 2018-01-17 NOTE — Telephone Encounter (Signed)
Patient advised.

## 2018-01-17 NOTE — Telephone Encounter (Signed)
Patient states that she has been in more pain and is in need of an increase in her Gabapentin.  Pharmacy asks if they can get a new Rx?

## 2018-01-17 NOTE — Telephone Encounter (Signed)
She can gradually increase the dose as long as she doesn't have sedation from her medications.  rx sent.  Thanks.

## 2018-01-22 ENCOUNTER — Ambulatory Visit
Admission: RE | Admit: 2018-01-22 | Discharge: 2018-01-22 | Disposition: A | Discharge status: Home / Self Care | Payer: PPO | Source: Ambulatory Visit | Attending: Family Medicine | Admitting: Family Medicine

## 2018-01-22 DIAGNOSIS — M4802 Spinal stenosis, cervical region: Secondary | ICD-10-CM | POA: Diagnosis not present

## 2018-01-22 DIAGNOSIS — M542 Cervicalgia: Secondary | ICD-10-CM | POA: Diagnosis not present

## 2018-01-22 DIAGNOSIS — M5021 Other cervical disc displacement,  high cervical region: Secondary | ICD-10-CM | POA: Insufficient documentation

## 2018-01-22 DIAGNOSIS — M5412 Radiculopathy, cervical region: Secondary | ICD-10-CM | POA: Insufficient documentation

## 2018-01-23 ENCOUNTER — Ambulatory Visit (INDEPENDENT_AMBULATORY_CARE_PROVIDER_SITE_OTHER): Payer: PPO | Admitting: General Surgery

## 2018-01-23 VITALS — BP 138/78 | HR 71 | Resp 12 | Ht 66.0 in | Wt 142.0 lb

## 2018-01-23 DIAGNOSIS — N6012 Diffuse cystic mastopathy of left breast: Secondary | ICD-10-CM | POA: Diagnosis not present

## 2018-01-23 DIAGNOSIS — N6011 Diffuse cystic mastopathy of right breast: Secondary | ICD-10-CM | POA: Diagnosis not present

## 2018-01-23 DIAGNOSIS — N6001 Solitary cyst of right breast: Secondary | ICD-10-CM | POA: Diagnosis not present

## 2018-01-23 NOTE — Progress Notes (Signed)
Patient ID: Debra Little, female   DOB: [birth year], 62 y.o.   MRN: [id number]  Chief Complaint  Patient presents with  . Follow-up    HPI Debra Little is a 62 y.o. female who presents for a breast evaluation. The most recent mammogram was done on 01/14/2018.  Patient does perform regular self breast checks and gets regular mammograms done.    HPI  Past Medical History:  Diagnosis Date  . Anxiety    sees Dr. Nolen Mu  . Cyst of breast    per Dr. Lemar Livings  . Fibromyalgia   . Hemorrhoids   . Hiatal hernia   . Hyperlipidemia   . IBS (irritable bowel syndrome)    constipation predominant  . IC (interstitial cystitis)    per [facility]  . MVP (mitral valve prolapse)    [facility] cards eval 2012  . Vitamin D deficiency    history of    Past Surgical History:  Procedure Laterality Date  . ABDOMINAL HYSTERECTOMY    . BREAST EXCISIONAL BIOPSY Right    benign  . BREAST EXCISIONAL BIOPSY Bilateral    benign  . COLONOSCOPY  09/2014   Dr. Mechele Collin  . excision of breast cysts     hx of multiple cyst aspirations  . EXPLORATORY LAPAROTOMY    . TUBAL LIGATION      Family History  Problem Relation Age of Onset  . Breast cancer Mother 55  . Colon cancer Father 64  . Ovarian cancer Maternal Aunt 80  . Breast cancer Cousin 47  . Gallbladder disease Paternal Grandmother     Social History Social History   Tobacco Use  . Smoking status: Never Smoker  . Smokeless tobacco: Never Used  Substance Use Topics  . Alcohol use: No    Alcohol/week: 0.0 oz  . Drug use: No    Allergies  Allergen Reactions  . Bentyl [Dicyclomine Hcl]     Lack of effect  . Buspar [Buspirone] Other (See Comments)    Palpitations.   . Celecoxib     REACTION: unspecified  . Cortisone Other (See Comments)    flush  . Doxycycline     Palpitations and chest pain  . Mobic [Meloxicam]     Palpitations.  But can tolerate aleve  . Nitrofurantoin Monohyd Macro   . Penicillins     REACTION:  rash  . Sulfonamide Derivatives     REACTION: nausea  . Zoloft [Sertraline Hcl]     nausea and diarrhea.       Current Outpatient Medications  Medication Sig Dispense Refill  . atorvastatin (LIPITOR) 40 MG tablet Take 40 mg by mouth daily.  1  . cholecalciferol (VITAMIN D) 1000 units tablet Take 1,000 Units by mouth daily.    . clobetasol ointment (TEMOVATE) 0.05 % Apply 1 application topically 2 (two) times daily. 30 g 0  . clonazePAM (KLONOPIN) 0.5 MG tablet Take 0.5 mg by mouth 2 (two) times daily.     . DULoxetine (CYMBALTA) 60 MG capsule Take 60 mg by mouth daily.    [name] gabapentin (NEURONTIN) 100 MG capsule Take 1-2 capsules (100-200 mg total) by mouth 3 (three) times daily as needed. 90 capsule 1  . HYDROcodone-acetaminophen (NORCO/VICODIN) 5-325 MG tablet Take 0.5-1 tablets by mouth 3 (three) times daily as needed for moderate pain. 15 tablet 0  . loratadine (CLARITIN) 10 MG tablet Take 1 tablet (10 mg total) by mouth daily. (Patient taking differently: Take 10 mg by mouth daily as  needed. )    . magnesium hydroxide (MILK OF MAGNESIA) 400 MG/5ML suspension Take by mouth daily as needed for mild constipation.    . Meth-Hyo-M Bl-Na Phos-Ph Sal (URIBEL) 118 MG CAPS As needed.    . methocarbamol (ROBAXIN) 500 MG tablet Take 1 tablet (500 mg total) by mouth 4 (four) times daily. 20 tablet 0  . mirabegron ER (MYRBETRIQ) 50 MG TB24 tablet Take 50 mg by mouth daily.    [name] omeprazole (PRILOSEC) 20 MG capsule Take 20 mg by mouth 2 (two) times daily before a meal. BRAND NAME ONLY    . progesterone (PROMETRIUM) 100 MG capsule Take 1 capsule (100 mg total) by mouth daily. (Patient taking differently: Take 100 mg by mouth every other day. ) 30 capsule 6  . Triamcinolone Acetonide (NASACORT AQ NA) Place into the nose at bedtime.    . valACYclovir (VALTREX) 500 MG tablet Take 1 tablet (500 mg total) by mouth daily. 6 nights on, 1 night off (Patient taking differently: Take 500 mg by mouth daily. ) 30  tablet 6  . vitamin B-12 (CYANOCOBALAMIN) 1000 MCG tablet Take 1 tablet (1,000 mcg total) by mouth daily.    [name] guaiFENesin (MUCUS RELIEF CHEST CONGESTION) 200 MG tablet Take 200 mg by mouth every 4 (four) hours as needed for cough or to loosen phlegm.     No current facility-administered medications for this visit.     Review of Systems Review of Systems  Constitutional: Negative.   Respiratory: Negative.   Cardiovascular: Negative.     Blood pressure 138/78, pulse 71, resp. rate 12, height 5\' 6"  (1.676 m), weight 142 lb (64.4 kg).  Physical Exam Physical Exam  Constitutional: She is oriented to person, place, and time. She appears well-developed and well-nourished.  Eyes: Conjunctivae are normal. No scleral icterus.  Neck: Neck supple.  Cardiovascular: Normal rate, regular rhythm and normal heart sounds.  Pulmonary/Chest: Effort normal and breath sounds normal. Right breast exhibits no inverted nipple, no mass, no nipple discharge, no skin change and no tenderness. Left breast exhibits no inverted nipple, no mass, no nipple discharge, no skin change and no tenderness.  Lymphadenopathy:    She has no cervical adenopathy.    She has no axillary adenopathy.  Neurological: She is alert and oriented to person, place, and time.  Skin: Skin is warm and dry.    Data Reviewed January 14, 2018 bilateral screening mammograms were reviewed.  BI-RADS-1.  Assessment    Unremarkable breast exam.  Candidate for annual follow-up with her PCP.  A great deal of stress related to her divorce.    Plan  Patient will be asked to return to her PCP  in one year with a bilateral screening mammogram. The patient is aware to call back for any questions or concerns.    HPI, Physical Exam, Assessment and Plan have been scribed under the direction and in the presence of Donnalee Curry, MD.  Ples Specter, CMA  I have completed the exam and reviewed the above documentation for accuracy and  completeness.  I agree with the above.  Museum/gallery conservator has been used and any errors in dictation or transcription are unintentional.  Donnalee Curry, M.D., F.A.C.S.  Merrily Pew Hilja Kintzel 01/24/2018, 9:28 PM

## 2018-01-23 NOTE — Patient Instructions (Signed)
Patient will be asked to return to her PCP  in one year with a bilateral screening mammogram.The patient is aware to call back for any questions or concerns.  

## 2018-01-24 ENCOUNTER — Other Ambulatory Visit: Payer: Self-pay | Admitting: Family Medicine

## 2018-01-24 DIAGNOSIS — N6011 Diffuse cystic mastopathy of right breast: Secondary | ICD-10-CM | POA: Insufficient documentation

## 2018-01-24 DIAGNOSIS — N6012 Diffuse cystic mastopathy of left breast: Secondary | ICD-10-CM

## 2018-01-24 DIAGNOSIS — M792 Neuralgia and neuritis, unspecified: Secondary | ICD-10-CM

## 2018-01-25 ENCOUNTER — Encounter: Payer: Self-pay | Admitting: Family Medicine

## 2018-01-25 ENCOUNTER — Telehealth: Payer: Self-pay

## 2018-01-25 MED ORDER — HYDROCODONE-ACETAMINOPHEN 5-325 MG PO TABS: 1.0000 | ORAL_TABLET | Freq: Three times a day (TID) | ORAL | 0 refills | Status: DC | PRN

## 2018-01-25 NOTE — Telephone Encounter (Signed)
Addendum- I misspoke.  I have seen the MRI results.   She has been referred.  I meant to say I'll await neurosurgery input.  Thanks.

## 2018-01-25 NOTE — Telephone Encounter (Signed)
Copied from [place]. Topic: Quick Communication - See Telephone Encounter >> Jan 14, 2018  2:04 PM Vernona Rieger wrote: CRM for notification. See Telephone encounter for:   01/14/18.  Pt said that Dr Damita Dunnings advised her to up her dosage on her gabapentin. She said she has been doing that & is still in a lot of pain. Her MRI is not until next week. Her neck, shoulder & left arm is still hurting. She wants to know what else Dr Damita Dunnings would like her to do to help with the pain until the MRI. Call back is [phone] >> Jan 25, 2018  3:04 PM Oneta Rack wrote: Relation to pt: self  Call back [place]: [id number] [place] [place]  Reason for call:  Patient states HYDROcodone-acetaminophen (NORCO/VICODIN) 5-325 MG tablet is not working for neck, shoulder pain, please advise

## 2018-01-25 NOTE — Telephone Encounter (Signed)
I would try taking 1.5 tab of hydrocodone per dose in the meantime.   She had prev rx for prednisone and gabapentin.  If those didn't help then would try higher dose of hydrocodone.  I'll await the MRI.   rx sent.  Thanks.

## 2018-01-25 NOTE — Telephone Encounter (Signed)
error:[id number] ° °

## 2018-01-25 NOTE — Telephone Encounter (Signed)
Spoke to pt. She already received the results and is being referred to a neurosurgeon in [place].

## 2018-01-30 DIAGNOSIS — R350 Frequency of micturition: Secondary | ICD-10-CM | POA: Diagnosis not present

## 2018-01-30 DIAGNOSIS — N3946 Mixed incontinence: Secondary | ICD-10-CM | POA: Diagnosis not present

## 2018-01-31 ENCOUNTER — Other Ambulatory Visit: Payer: Self-pay | Admitting: Neurosurgery

## 2018-01-31 DIAGNOSIS — M5020 Other cervical disc displacement, unspecified cervical region: Secondary | ICD-10-CM | POA: Diagnosis not present

## 2018-01-31 DIAGNOSIS — M4722 Other spondylosis with radiculopathy, cervical region: Secondary | ICD-10-CM | POA: Diagnosis not present

## 2018-01-31 DIAGNOSIS — M542 Cervicalgia: Secondary | ICD-10-CM | POA: Diagnosis not present

## 2018-01-31 DIAGNOSIS — M5030 Other cervical disc degeneration, unspecified cervical region: Secondary | ICD-10-CM | POA: Diagnosis not present

## 2018-02-01 DIAGNOSIS — F411 Generalized anxiety disorder: Secondary | ICD-10-CM | POA: Diagnosis not present

## 2018-02-01 DIAGNOSIS — F330 Major depressive disorder, recurrent, mild: Secondary | ICD-10-CM | POA: Diagnosis not present

## 2018-02-01 DIAGNOSIS — F410 Panic disorder [episodic paroxysmal anxiety] without agoraphobia: Secondary | ICD-10-CM | POA: Diagnosis not present

## 2018-02-11 ENCOUNTER — Encounter (HOSPITAL_COMMUNITY): Admission: RE | Payer: Self-pay | Source: Ambulatory Visit

## 2018-02-11 ENCOUNTER — Inpatient Hospital Stay (HOSPITAL_COMMUNITY): Admission: RE | Admit: 2018-02-11 | Payer: PPO | Source: Ambulatory Visit | Admitting: Neurosurgery

## 2018-02-11 SURGERY — ANTERIOR CERVICAL DECOMPRESSION/DISCECTOMY FUSION 3 LEVELS
Anesthesia: General

## 2018-03-04 ENCOUNTER — Other Ambulatory Visit: Payer: Self-pay | Admitting: Family Medicine

## 2018-03-04 NOTE — Telephone Encounter (Signed)
Electronic refill request Last refill 01/25/18 #30 Last office visit 01/09/18

## 2018-03-04 NOTE — Telephone Encounter (Signed)
Copied from [place]. Topic: Quick Communication - Rx Refill/Question >> Mar 04, 2018 12:12 PM Cleaster Corin, NT wrote: Medication:HYDROcodone-acetaminophen (NORCO/VICODIN) 5-325 MG tablet [[id number]]  Has the patient contacted their pharmacy? no (Agent: If no, request that the patient contact the pharmacy for the refill.) Preferred Pharmacy (with phone number or street name): [id number] [place], [address] [place] [place] [address] Phone: [phone] Fax: [phone]   Agent: Please be advised that RX refills may take up to 3 business days. We ask that you follow-up with your pharmacy.

## 2018-03-05 NOTE — Telephone Encounter (Signed)
Patient says it was supposed to have been on the 18th (of March, I suppose) but her brother wasn't doing well at the time and she wasn't hurting as bad at the time so she cancelled the surgery.  She called yesterday to reschedule and is expecting to hear from them today.  I asked the patient to call when the appt is rescheduled.

## 2018-03-05 NOTE — Telephone Encounter (Signed)
What happened with her planning to go through with surgery?  What is her status?  When is she going to see neurosurgery again?  Please let me know.  Thanks. I did not refill this yet.

## 2018-03-06 MED ORDER — HYDROCODONE-ACETAMINOPHEN 5-325 MG PO TABS: 1.0000 | ORAL_TABLET | Freq: Three times a day (TID) | ORAL | 0 refills | Status: DC | PRN

## 2018-03-06 NOTE — Telephone Encounter (Addendum)
Pt called; pt has been trying to get In touch with neurosurgeon; pt found out neurosurgeon is out of office for 1 1/2 weeks; pt has 8 hydrocodone left and pain in neck has restarted. Pt request refill [facility]cod[facility] to [facility]; pt request cb.

## 2018-03-06 NOTE — Addendum Note (Signed)
Addended by: Helene Shoe on: 03/06/2018 08:53 AM   Modules accepted: Orders

## 2018-03-06 NOTE — Telephone Encounter (Signed)
Sent.  [place] database reviewed.  Nothing out of order.  I'll await the input from the neurosurgery clinic.  She needs to f/u with them.  If new sx or weakness in the meantime, she needs eval/tx sooner.  Thanks.

## 2018-03-06 NOTE — Addendum Note (Signed)
Addended by: Tonia Ghent on: 03/06/2018 11:02 AM   Modules accepted: Orders

## 2018-03-06 NOTE — Telephone Encounter (Signed)
Patient notified as instructed by telephone and verbalized understanding. 

## 2018-03-21 ENCOUNTER — Other Ambulatory Visit: Payer: Self-pay | Admitting: Neurosurgery

## 2018-03-21 DIAGNOSIS — M5020 Other cervical disc displacement, unspecified cervical region: Secondary | ICD-10-CM | POA: Diagnosis not present

## 2018-03-21 DIAGNOSIS — M5412 Radiculopathy, cervical region: Secondary | ICD-10-CM | POA: Diagnosis not present

## 2018-03-21 DIAGNOSIS — M4722 Other spondylosis with radiculopathy, cervical region: Secondary | ICD-10-CM | POA: Diagnosis not present

## 2018-03-21 DIAGNOSIS — M5030 Other cervical disc degeneration, unspecified cervical region: Secondary | ICD-10-CM | POA: Diagnosis not present

## 2018-03-29 DIAGNOSIS — E782 Mixed hyperlipidemia: Secondary | ICD-10-CM | POA: Diagnosis not present

## 2018-03-29 DIAGNOSIS — R079 Chest pain, unspecified: Secondary | ICD-10-CM | POA: Diagnosis not present

## 2018-03-29 DIAGNOSIS — I341 Nonrheumatic mitral (valve) prolapse: Secondary | ICD-10-CM | POA: Diagnosis not present

## 2018-04-05 ENCOUNTER — Encounter: Payer: Self-pay | Admitting: Family Medicine

## 2018-04-05 ENCOUNTER — Ambulatory Visit (INDEPENDENT_AMBULATORY_CARE_PROVIDER_SITE_OTHER): Payer: PPO | Admitting: Family Medicine

## 2018-04-05 VITALS — BP 126/70 | HR 78 | Temp 98.7°F | Ht 66.0 in | Wt 142.5 lb

## 2018-04-05 DIAGNOSIS — Z87898 Personal history of other specified conditions: Secondary | ICD-10-CM

## 2018-04-05 DIAGNOSIS — M792 Neuralgia and neuritis, unspecified: Secondary | ICD-10-CM

## 2018-04-05 DIAGNOSIS — M542 Cervicalgia: Secondary | ICD-10-CM

## 2018-04-05 LAB — GLUCOSE, RANDOM: Glucose, Bld: 75 mg/dL (ref 70–99)

## 2018-04-05 MED ORDER — METHOCARBAMOL 500 MG PO TABS: 500.0000 mg | ORAL_TABLET | Freq: Four times a day (QID) | ORAL | 1 refills | Status: DC

## 2018-04-05 NOTE — Patient Instructions (Signed)
I think it is reasonable to get through the cardiology testing and then go from there.  Take care.  Glad to see you.  Update me as needed.

## 2018-04-05 NOTE — Progress Notes (Signed)
He isn't having pain now and was able to stop hydrocodone.  She has surgery scheduled.  She had relief with traction in the meantime.  She wanted to talk about her surgery.  No neck or arm pain now.    We talked about risk vs benefit of surgery.  She is going to have to have cardiac eval in the meantime.  She has some palpitations, unclear how much of that she can attributed that to stress and worry about the surgery.    She has the chronic stressor of her brother's care needs.    She was worried about relative hypoglycemia, see notes on labs.    Meds, vitals, and allergies reviewed.   ROS: Per HPI unless specifically indicated in ROS section   nad ncat Neck supple, no LA rrr ctab Abd soft Ext w/o edema.

## 2018-04-07 NOTE — Assessment & Plan Note (Signed)
Her pain is improved in the meantime.  She is trying to figure out what to do about surgery.  The concern is that if she does not have the surgery she will end up back in the same situation she was before.  However she also does not want to go through with surgery if she does not need to.  I told her the decision really was not up to me.  She needs to make a decision that she can tolerate.  It probably makes sense to go through with the cardiac evaluation just to make sure there is no contraindication to the surgery in and of itself.  Discussed with patient about risk and benefit.  She understood.  She will go to cardiac evaluation in the meantime.  She was worried about her blood glucose level in the meantime and it is reasonable to check a serum glucose.  We attempted a fingerstick but could not get an adequate sample for reliable reading.  See notes on labs.

## 2018-04-09 DIAGNOSIS — H6123 Impacted cerumen, bilateral: Secondary | ICD-10-CM | POA: Diagnosis not present

## 2018-04-09 DIAGNOSIS — H902 Conductive hearing loss, unspecified: Secondary | ICD-10-CM | POA: Diagnosis not present

## 2018-04-12 DIAGNOSIS — I341 Nonrheumatic mitral (valve) prolapse: Secondary | ICD-10-CM | POA: Diagnosis not present

## 2018-04-12 DIAGNOSIS — R079 Chest pain, unspecified: Secondary | ICD-10-CM | POA: Diagnosis not present

## 2018-04-16 ENCOUNTER — Other Ambulatory Visit (HOSPITAL_COMMUNITY): Payer: Self-pay

## 2018-04-18 DIAGNOSIS — R079 Chest pain, unspecified: Secondary | ICD-10-CM | POA: Diagnosis not present

## 2018-04-18 DIAGNOSIS — E782 Mixed hyperlipidemia: Secondary | ICD-10-CM | POA: Diagnosis not present

## 2018-04-18 DIAGNOSIS — I341 Nonrheumatic mitral (valve) prolapse: Secondary | ICD-10-CM | POA: Diagnosis not present

## 2018-04-24 ENCOUNTER — Encounter (HOSPITAL_COMMUNITY): Admission: RE | Payer: Self-pay | Source: Ambulatory Visit

## 2018-04-24 ENCOUNTER — Ambulatory Visit (HOSPITAL_COMMUNITY): Admission: RE | Admit: 2018-04-24 | Payer: PPO | Source: Ambulatory Visit | Admitting: Neurosurgery

## 2018-04-24 SURGERY — ANTERIOR CERVICAL DECOMPRESSION/DISCECTOMY FUSION 2 LEVELS
Anesthesia: General

## 2018-04-30 DIAGNOSIS — F411 Generalized anxiety disorder: Secondary | ICD-10-CM | POA: Diagnosis not present

## 2018-04-30 DIAGNOSIS — F330 Major depressive disorder, recurrent, mild: Secondary | ICD-10-CM | POA: Diagnosis not present

## 2018-04-30 DIAGNOSIS — F410 Panic disorder [episodic paroxysmal anxiety] without agoraphobia: Secondary | ICD-10-CM | POA: Diagnosis not present

## 2018-05-20 DIAGNOSIS — K5909 Other constipation: Secondary | ICD-10-CM | POA: Diagnosis not present

## 2018-05-20 DIAGNOSIS — K219 Gastro-esophageal reflux disease without esophagitis: Secondary | ICD-10-CM | POA: Diagnosis not present

## 2018-05-27 DIAGNOSIS — F330 Major depressive disorder, recurrent, mild: Secondary | ICD-10-CM | POA: Diagnosis not present

## 2018-05-27 DIAGNOSIS — F411 Generalized anxiety disorder: Secondary | ICD-10-CM | POA: Diagnosis not present

## 2018-05-27 DIAGNOSIS — F410 Panic disorder [episodic paroxysmal anxiety] without agoraphobia: Secondary | ICD-10-CM | POA: Diagnosis not present

## 2018-06-24 ENCOUNTER — Other Ambulatory Visit: Payer: Self-pay | Admitting: Obstetrics and Gynecology

## 2018-06-24 DIAGNOSIS — A6004 Herpesviral vulvovaginitis: Secondary | ICD-10-CM

## 2018-06-25 ENCOUNTER — Ambulatory Visit (INDEPENDENT_AMBULATORY_CARE_PROVIDER_SITE_OTHER): Payer: PPO | Admitting: Family Medicine

## 2018-06-25 ENCOUNTER — Encounter: Payer: Self-pay | Admitting: Family Medicine

## 2018-06-25 ENCOUNTER — Ambulatory Visit (INDEPENDENT_AMBULATORY_CARE_PROVIDER_SITE_OTHER): Payer: PPO

## 2018-06-25 VITALS — BP 142/62 | HR 78 | Temp 98.6°F | Wt 143.5 lb

## 2018-06-25 VITALS — BP 142/62 | HR 78 | Temp 98.6°F | Ht 66.25 in | Wt 143.5 lb

## 2018-06-25 DIAGNOSIS — E785 Hyperlipidemia, unspecified: Secondary | ICD-10-CM | POA: Diagnosis not present

## 2018-06-25 DIAGNOSIS — E538 Deficiency of other specified B group vitamins: Secondary | ICD-10-CM

## 2018-06-25 DIAGNOSIS — Z0000 Encounter for general adult medical examination without abnormal findings: Secondary | ICD-10-CM | POA: Diagnosis not present

## 2018-06-25 DIAGNOSIS — E559 Vitamin D deficiency, unspecified: Secondary | ICD-10-CM

## 2018-06-25 DIAGNOSIS — R5383 Other fatigue: Secondary | ICD-10-CM | POA: Diagnosis not present

## 2018-06-25 DIAGNOSIS — R0683 Snoring: Secondary | ICD-10-CM

## 2018-06-25 LAB — COMPREHENSIVE METABOLIC PANEL
ALT: 11 U/L (ref 0–35)
AST: 12 U/L (ref 0–37)
Albumin: 3.9 g/dL (ref 3.5–5.2)
Alkaline Phosphatase: 61 U/L (ref 39–117)
BUN: 14 mg/dL (ref 6–23)
CO2: 32 mEq/L (ref 19–32)
Calcium: 9.5 mg/dL (ref 8.4–10.5)
Chloride: 103 mEq/L (ref 96–112)
Creatinine, Ser: 0.83 mg/dL (ref 0.40–1.20)
GFR: 74.58 mL/min (ref >60.00)
Glucose, Bld: 103 mg/dL — ABNORMAL HIGH (ref 70–99)
Potassium: 3.8 mEq/L (ref 3.5–5.1)
Sodium: 142 mEq/L (ref 135–145)
Total Bilirubin: 0.4 mg/dL (ref 0.2–1.2)
Total Protein: 6.9 g/dL (ref 6.0–8.3)

## 2018-06-25 LAB — CBC WITH DIFFERENTIAL/PLATELET
Basophils Absolute: 0.1 10*3/uL (ref 0.0–0.1)
Basophils Relative: 0.8 % (ref 0.0–3.0)
Eosinophils Absolute: 0.2 10*3/uL (ref 0.0–0.7)
Eosinophils Relative: 2.1 % (ref 0.0–5.0)
HCT: 37.6 % (ref 36.0–46.0)
Hemoglobin: 12.9 g/dL (ref 12.0–15.0)
Lymphocytes Relative: 21.8 % (ref 12.0–46.0)
Lymphs Abs: 2 10*3/uL (ref 0.7–4.0)
MCHC: 34.4 g/dL (ref 30.0–36.0)
MCV: 88.7 fl (ref 78.0–100.0)
Monocytes Absolute: 0.7 10*3/uL (ref 0.1–1.0)
Monocytes Relative: 8.3 % (ref 3.0–12.0)
Neutro Abs: 6 10*3/uL (ref 1.4–7.7)
Neutrophils Relative %: 67 % (ref 43.0–77.0)
Platelets: 247 10*3/uL (ref 150.0–400.0)
RBC: 4.23 Mil/uL (ref 3.87–5.11)
RDW: 13 % (ref 11.5–15.5)
WBC: 8.9 10*3/uL (ref 4.0–10.5)

## 2018-06-25 LAB — LIPID PANEL
Cholesterol: 141 mg/dL (ref 0–200)
HDL: 51.6 mg/dL (ref >39.00)
LDL Cholesterol: 68 mg/dL (ref 0–99)
NonHDL: 89.01
Total CHOL/HDL Ratio: 3
Triglycerides: 106 mg/dL (ref 0.0–149.0)
VLDL: 21.2 mg/dL (ref 0.0–40.0)

## 2018-06-25 LAB — TSH: TSH: 0.81 u[IU]/mL (ref 0.35–4.50)

## 2018-06-25 LAB — VITAMIN D 25 HYDROXY (VIT D DEFICIENCY, FRACTURES): VITD: 48.71 ng/mL (ref 30.00–100.00)

## 2018-06-25 LAB — VITAMIN B12: Vitamin B-12: 902 pg/mL (ref 211–911)

## 2018-06-25 NOTE — Patient Instructions (Signed)
Go to the lab on the way out.  We'll contact you with your lab report. We will call about your referral.  Rosaria Ferries or Azalee Course will call you if you don't see one of them on the way out.  Take care.  Glad to see you.

## 2018-06-25 NOTE — Patient Instructions (Signed)
Debra Little , Thank you for taking time to come for your Medicare Wellness Visit. I appreciate your ongoing commitment to your health goals. Please review the following plan we discussed and let me know if I can assist you in the future.   These are the goals we discussed: Goals    . Patient Stated     Starting 06/25/2018,  I will continue to take medications as prescribed.        This is a list of the screening recommended for you and due dates:  Health Maintenance  Topic Date Due  . Flu Shot  09/28/2021*  . Tetanus Vaccine  09/02/2018  . Mammogram  01/15/2020  . Colon Cancer Screening  09/28/2022  .  Hepatitis C: One time screening is recommended by Center for Disease Control  (CDC) for  adults born from [birth year] through 1963.   Completed  . HIV Screening  Completed  *Topic was postponed. The date shown is not the original due date.   Preventive Care for Adults  A healthy lifestyle and preventive care can promote health and wellness. Preventive health guidelines for adults include the following key practices.  . A routine yearly physical is a good way to check with your health care provider about your health and preventive screening. It is a chance to share any concerns and updates on your health and to receive a thorough exam.  . Visit your dentist for a routine exam and preventive care every 6 months. Brush your teeth twice a day and floss once a day. Good oral hygiene prevents tooth decay and gum disease.  . The frequency of eye exams is based on your age, health, family medical history, use  of contact lenses, and other factors. Follow your health care provider's recommendations for frequency of eye exams.  . Eat a healthy diet. Foods like vegetables, fruits, whole grains, low-fat dairy products, and lean protein foods contain the nutrients you need without too many calories. Decrease your intake of foods high in solid fats, added sugars, and salt. Eat the right amount of calories  for you. Get information about a proper diet from your health care provider, if necessary.  . Regular physical exercise is one of the most important things you can do for your health. Most adults should get at least 150 minutes of moderate-intensity exercise (any activity that increases your heart rate and causes you to sweat) each week. In addition, most adults need muscle-strengthening exercises on 2 or more days a week.  Silver Sneakers may be a benefit available to you. To determine eligibility, you may visit the website: www.silversneakers.com or contact program at 223-596-3398 Mon-Fri between 8AM-8PM.   . Maintain a healthy weight. The body mass index (BMI) is a screening tool to identify possible weight problems. It provides an estimate of body fat based on height and weight. Your health care provider can find your BMI and can help you achieve or maintain a healthy weight.   For adults 20 years and older: ? A BMI below 18.5 is considered underweight. ? A BMI of 18.5 to 24.9 is normal. ? A BMI of 25 to 29.9 is considered overweight. ? A BMI of 30 and above is considered obese.   . Maintain normal blood lipids and cholesterol levels by exercising and minimizing your intake of saturated fat. Eat a balanced diet with plenty of fruit and vegetables. Blood tests for lipids and cholesterol should begin at age 1 and be repeated every 5 years.  If your lipid or cholesterol levels are high, you are over 50, or you are at high risk for heart disease, you may need your cholesterol levels checked more frequently. Ongoing high lipid and cholesterol levels should be treated with medicines if diet and exercise are not working.  . If you smoke, find out from your health care provider how to quit. If you do not use tobacco, please do not start.  . If you choose to drink alcohol, please do not consume more than 2 drinks per day. One drink is considered to be 12 ounces (355 mL) of beer, 5 ounces (148 mL) of  wine, or 1.5 ounces (44 mL) of liquor.  . If you are 12-62 years old, ask your health care provider if you should take aspirin to prevent strokes.  . Use sunscreen. Apply sunscreen liberally and repeatedly throughout the day. You should seek shade when your shadow is shorter than you. Protect yourself by wearing long sleeves, pants, a wide-brimmed hat, and sunglasses year round, whenever you are outdoors.  . Once a month, do a whole body skin exam, using a mirror to look at the skin on your back. Tell your health care provider of new moles, moles that have irregular borders, moles that are larger than a pencil eraser, or moles that have changed in shape or color.

## 2018-06-25 NOTE — Progress Notes (Signed)
PCP notes:   Health maintenance:  No gaps identified.   Abnormal screenings:   Fall risk - hx of single fall Fall Risk  06/25/2018  Falls in the past year? Yes  Comment foot became tangled in bedskirt and pt lost balance  Number falls in past yr: 1  Injury with Fall? Yes  Comment bruising only; no medical treatment  Risk for fall due to : History of fall(s)   Patient concerns:   None  Nurse concerns:  None  Next PCP appt:   07/11/18 @ 1215  I reviewed health advisor's note, was available for consultation on the day of service listed in this note, and agree with documentation and plan. Elsie Stain, MD.

## 2018-06-25 NOTE — Progress Notes (Signed)
Fatigue, going on for "a good while."  She is pushing her self to keep moving.  She isn't waking feeling refreshed.  She snores.  She doesn't wake up gasping for air.  No known apnea. She is still caring for her brother, he is at [facility] and rehab.  We talked about ddx re: fatigue, ie OSA, MDD, etc. D/w pt about routine labs.   She has some occ R occipital headaches, throbbing with the HA.  No vision loss.  No focal neuro changes.    She is going to f/u with the eye clinic soon.  She has h/o cataracts and has some persistent blurriness but this isn't an acute change.  No vision loss.    She has seen Gi about constipation.   Meds, vitals, and allergies reviewed.   ROS: Per HPI unless specifically indicated in ROS section   GEN: nad, alert and oriented, affect is slightly flat but this is her baseline.  Her judgment and speech appear normal otherwise. HEENT: mucous membranes moist NECK: supple w/o LA CV: rrr. PULM: ctab, no inc wob ABD: soft, +bs EXT: no edema SKIN: Well-perfused.

## 2018-06-25 NOTE — Progress Notes (Signed)
Subjective:   Debra Little is a 62 y.o. female who presents for an Initial Medicare Annual Wellness Visit.  Review of Systems    N/A  Cardiac Risk Factors include: dyslipidemia     Objective:    Today's Vitals   06/25/18 1139  BP: (!) 142/62  Pulse: 78  Temp: 98.6 F (37 C)  TempSrc: Oral  SpO2: 98%  Weight: 143 lb 8 oz (65.1 kg)  Height: 5' 6.25" (1.683 m)  PainSc: 0-No pain   Body mass index is 22.99 kg/m.  Advanced Directives 06/25/2018  Does Patient Have a Medical Advance Directive? No  Would patient like information on creating a medical advance directive? Yes (MAU/Ambulatory/Procedural Areas - Information given)    Current Medications (verified) Outpatient Encounter Medications as of 06/25/2018  Medication Sig  . atorvastatin (LIPITOR) 40 MG tablet Take 40 mg by mouth daily.  . cholecalciferol (VITAMIN D) 1000 units tablet Take 1,000 Units by mouth daily.  . clonazePAM (KLONOPIN) 0.5 MG tablet Take 0.5 mg by mouth 2 (two) times daily.   . DULoxetine (CYMBALTA) 60 MG capsule Take 60 mg by mouth daily.  [name] gabapentin (NEURONTIN) 100 MG capsule Take 1-2 capsules (100-200 mg total) by mouth 3 (three) times daily as needed.  [name] guaiFENesin (MUCUS RELIEF CHEST CONGESTION) 200 MG tablet Take 200 mg by mouth every 4 (four) hours as needed for cough or to loosen phlegm.  . linaclotide (LINZESS) 72 MCG capsule Take 72 mcg by mouth 2 (two) times a week.  . loratadine (CLARITIN) 10 MG tablet Take 1 tablet (10 mg total) by mouth daily.  . methocarbamol (ROBAXIN) 500 MG tablet Take 1 tablet (500 mg total) by mouth 4 (four) times daily.  [name] omeprazole (PRILOSEC) 20 MG capsule Take 20 mg by mouth 2 (two) times daily before a meal. BRAND NAME ONLY  . progesterone (PROMETRIUM) 100 MG capsule Take 1 capsule (100 mg total) by mouth daily.  . Triamcinolone Acetonide (NASACORT AQ NA) Place into the nose at bedtime.  . valACYclovir (VALTREX) 500 MG tablet Take 1 tablet (500 mg total)  by mouth daily.  . vitamin B-12 (CYANOCOBALAMIN) 1000 MCG tablet Take 1 tablet (1,000 mcg total) by mouth daily.   No facility-administered encounter medications on file as of 06/25/2018.     Allergies (verified) Amitiza [lubiprostone]; Bentyl [dicyclomine hcl]; Buspar [buspirone]; Celecoxib; Cortisone; Doxycycline; Mobic [meloxicam]; Nitrofurantoin monohyd macro; Penicillins; Sulfonamide derivatives; and Zoloft [sertraline hcl]   History: Past Medical History:  Diagnosis Date  . Anxiety    sees Dr. [name]  . Cyst of breast    per [name]  . Fibromyalgia   . Hemorrhoids   . Hiatal hernia   . Hyperlipidemia   . IBS (irritable bowel syndrome)    constipation predominant  . IC (interstitial cystitis)    per [place] [facility]  . MVP (mitral valve prolapse)    [facility] cards eval 2012  . Vitamin D deficiency    history of   Past Surgical History:  Procedure Laterality Date  . ABDOMINAL HYSTERECTOMY    . BREAST EXCISIONAL BIOPSY Right    benign  . BREAST EXCISIONAL BIOPSY Bilateral    benign  . COLONOSCOPY  09/2014   Dr. [name]  . excision of breast cysts     hx of multiple cyst aspirations  . EXPLORATORY LAPAROTOMY    . TUBAL LIGATION     Family History  Problem Relation Age of Onset  . Breast cancer Mother 15  .  Colon cancer Father 6  . Ovarian cancer Maternal Aunt 80  . Breast cancer Cousin 3  . Gallbladder disease Paternal Grandmother    Social History   Socioeconomic History  . Marital status: Divorced    Spouse name: Not on file  . Number of children: 1  . Years of education: Not on file  . Highest education level: Not on file  Occupational History  . Occupation: [employer]  Social Needs  . Financial resource strain: Not on file  . Food insecurity:    Worry: Not on file    Inability: Not on file  . Transportation needs:    Medical: Not on file    Non-medical: Not on file  Tobacco Use  . Smoking status: Never Smoker  . Smokeless tobacco: Never  Used  Substance and Sexual Activity  . Alcohol use: No    Alcohol/week: 0.0 oz  . Drug use: No  . Sexual activity: Not on file  Lifestyle  . Physical activity:    Days per week: Not on file    Minutes per session: Not on file  . Stress: Not on file  Relationships  . Social connections:    Talks on phone: Not on file    Gets together: Not on file    Attends religious service: Not on file    Active member of club or organization: Not on file    Attends meetings of clubs or organizations: Not on file    Relationship status: Not on file  Other Topics Concern  . Not on file  Social History Narrative   Married in 1998, divorced as of 2018, 1 son from previous relationship   Brother with MVA at 41, in rest home for many years as of 2019    Tobacco Counseling Counseling given: No   Clinical Intake:  Pre-visit preparation completed: Yes  Pain : No/denies pain Pain Score: 0-No pain     Nutritional Status: BMI 25 -29 Overweight Nutritional Risks: None Diabetes: No  How often do you need to have someone help you when you read instructions, pamphlets, or other written materials from your doctor or pharmacy?: 1 - Never What is the last grade level you completed in school?: GED  Interpreter Needed?: No  Comments: pt lives with a significant other Information entered by :: LPinson, LPN   Activities of Daily Living In your present state of health, do you have any difficulty performing the following activities: 06/25/2018  Hearing? N  Vision? Y  Comment blurred vision   Difficulty concentrating or making decisions? N  Walking or climbing stairs? Y  Comment SOB when climbing stairs  Dressing or bathing? N  Doing errands, shopping? N  Preparing Food and eating ? N  Using the Toilet? N  In the past six months, have you accidently leaked urine? Y  Do you have problems with loss of bowel control? N  Managing your Medications? N  Managing your Finances? N  Housekeeping or  managing your Housekeeping? N  Some recent data might be hidden     Immunizations and Health Maintenance Immunization History  Administered Date(s) Administered  . Influenza Whole 09/08/2010  . Influenza, Seasonal, Injecte, Preservative Fre 09/08/2014  . Influenza,inj,Quad PF,6+ Mos 09/23/2013  . Influenza-Unspecified 09/11/2015   There are no preventive care reminders to display for this patient.  Patient Care Team: Tonia Ghent, MD as PCP - General (Family Medicine) Bary Castilla, Forest Gleason, MD (General Surgery) Modesto Charon, MD (Family Medicine) Zara Council  A, PA-C as Physician Assistant (Urology)  Indicate any recent Medical Services you may have received from other than [facility] providers in the past year (date may be approximate).     Assessment:   This is a routine wellness examination for Kenli.  Hearing/Vision screen  Hearing Screening   125Hz  250Hz  500Hz  1000Hz  2000Hz  3000Hz  4000Hz  6000Hz  8000Hz   Right ear:   40 40 40  40    Left ear:   40 40 40  40    Vision Screening Comments: Sept 2018 @ [facility]  Dietary issues and exercise activities discussed: Current Exercise Habits: The patient does not participate in regular exercise at present, Exercise limited by: None identified  Goals    . Patient Stated     Starting 06/25/2018,  I will continue to take medications as prescribed.       Depression Screen PHQ 2/9 Scores 06/25/2018  PHQ - 2 Score 0  PHQ- 9 Score 0    Fall Risk Fall Risk  06/25/2018  Falls in the past year? Yes  Comment foot became tangled in bedskirt and pt lost balance  Number falls in past yr: 1  Injury with Fall? Yes  Comment bruising only; no medical treatment  Risk for fall due to : History of fall(s)   Cognitive Function: MMSE - Mini Mental State Exam 06/25/2018  Orientation to time 5  Orientation to Place 5  Registration 3  Attention/ Calculation 0  Recall 3  Language- name 2 objects 0  Language- repeat 1    Language- follow 3 step command 3  Language- read & follow direction 0  Write a sentence 0  Copy design 0  Total score 20     PLEASE NOTE: A Mini-Cog screen was completed. Maximum score is 20. A value of 0 denotes this part of Folstein MMSE was not completed or the patient failed this part of the Mini-Cog screening.   Mini-Cog Screening Orientation to Time - Max 5 pts Orientation to Place - Max 5 pts Registration - Max 3 pts Recall - Max 3 pts Language Repeat - Max 1 pts Language Follow 3 Step Command - Max 3 pts     Screening Tests Health Maintenance  Topic Date Due  . INFLUENZA VACCINE  09/28/2021 (Originally 06/27/2018)  . TETANUS/TDAP  09/02/2018  . MAMMOGRAM  01/15/2020  . COLONOSCOPY  09/28/2022  . Hepatitis C Screening  Completed  . HIV Screening  Completed     Plan:     I have personally reviewed, addressed, and noted the following in the patient's chart:  A. Medical and social history B. Use of alcohol, tobacco or illicit drugs  C. Current medications and supplements D. Functional ability and status E.  Nutritional status F.  Physical activity G. Advance directives H. List of other physicians I.  Hospitalizations, surgeries, and ER visits in previous 12 months J.  [place] to include hearing, vision, cognitive, depression L. Referrals and appointments - none  In addition, I have reviewed and discussed with patient certain preventive protocols, quality metrics, and best practice recommendations. A written personalized care plan for preventive services as well as general preventive health recommendations were provided to patient.  See attached scanned questionnaire for additional information.   Signed,   Lindell Noe, MHA, BS, LPN Health Coach

## 2018-06-26 NOTE — Assessment & Plan Note (Signed)
We talked about ddx re: fatigue, ie OSA, MDD, etc. D/w pt about routine labs, she was due for routine labs regarding other conditions so we will collect all of those today.  Unclear if she has obstructive sleep apnea.  Discussed with patient.  Refer to pulmonary.  She still could have symptoms related to depression, and she is following up with psychiatry.  Still okay for outpatient follow-up.  No suicidal homicidal intent.

## 2018-07-04 ENCOUNTER — Ambulatory Visit: Payer: Self-pay

## 2018-07-09 DIAGNOSIS — F411 Generalized anxiety disorder: Secondary | ICD-10-CM | POA: Diagnosis not present

## 2018-07-09 DIAGNOSIS — F330 Major depressive disorder, recurrent, mild: Secondary | ICD-10-CM | POA: Diagnosis not present

## 2018-07-10 DIAGNOSIS — F410 Panic disorder [episodic paroxysmal anxiety] without agoraphobia: Secondary | ICD-10-CM | POA: Diagnosis not present

## 2018-07-10 DIAGNOSIS — F411 Generalized anxiety disorder: Secondary | ICD-10-CM | POA: Diagnosis not present

## 2018-07-10 DIAGNOSIS — F330 Major depressive disorder, recurrent, mild: Secondary | ICD-10-CM | POA: Diagnosis not present

## 2018-07-11 ENCOUNTER — Ambulatory Visit (INDEPENDENT_AMBULATORY_CARE_PROVIDER_SITE_OTHER): Payer: PPO | Admitting: Family Medicine

## 2018-07-11 ENCOUNTER — Encounter: Payer: Self-pay | Admitting: Family Medicine

## 2018-07-11 ENCOUNTER — Institutional Professional Consult (permissible substitution): Payer: Self-pay | Admitting: Internal Medicine

## 2018-07-11 VITALS — BP 122/58 | HR 78 | Temp 98.6°F | Ht 66.25 in | Wt 143.5 lb

## 2018-07-11 DIAGNOSIS — E785 Hyperlipidemia, unspecified: Secondary | ICD-10-CM

## 2018-07-11 DIAGNOSIS — F339 Major depressive disorder, recurrent, unspecified: Secondary | ICD-10-CM

## 2018-07-11 DIAGNOSIS — R5383 Other fatigue: Secondary | ICD-10-CM

## 2018-07-11 DIAGNOSIS — Z0000 Encounter for general adult medical examination without abnormal findings: Secondary | ICD-10-CM

## 2018-07-11 DIAGNOSIS — K5900 Constipation, unspecified: Secondary | ICD-10-CM

## 2018-07-11 DIAGNOSIS — Z7189 Other specified counseling: Secondary | ICD-10-CM

## 2018-07-11 DIAGNOSIS — R432 Parageusia: Secondary | ICD-10-CM

## 2018-07-11 NOTE — Patient Instructions (Addendum)
The tastes changes don't sound ominous.  If worse then let me know.  Don't change your meds for now.  Take care.  Glad to see you.

## 2018-07-11 NOTE — Progress Notes (Signed)
She was not fasting when her labs were drawn, d/w pt.    Some mornings she'll occ have an episode of altered taste, where a drink tastes sweeter than it should.  No permanent sx.  Not happening later in the day, back to normal later in the day.  She has false teeth and has to use adhesive.  No tongue swelling or ulceration.    Fatigue.  Still fatigued, with stressors noted- change in home situation, her brother is chronically ill.  She is going to call about the pulmonary appointment re: snoring eval.    Elevated Cholesterol: Using medications without problems:yes Muscle aches: some aches at baseline, but likely not from statin.  She had stopped statin prev but aches didn't get better.  Diet compliance: encouraged.  D/w pt.  "not as good as it could be."   Exercise: encouraged.  D/w pt. "I'm not, but I need to."   She is living with a friend and safe at home.    Mood per psych.  I'll defer.  She had seen psych yesterday.    She is on trulance per [facility].  I'll defer.    Mammogram and breast exam per [name].   Pelvic and pap per [facility].   Colonoscopy 2018 She'll consider options for a designee if she were incapacitated.    PMH and SH reviewed  Meds, vitals, and allergies reviewed.   ROS: Per HPI unless specifically indicated in ROS section   GEN: nad, alert and oriented, affect is slightly flat.  Speech and judgment intact.  No suicidal or homicidal intent. HEENT: mucous membranes moist, OP wnl.  NECK: supple w/o LA CV: rrr. PULM: ctab, no inc wob ABD: soft, +bs EXT: no edema SKIN: no acute rash

## 2018-07-14 DIAGNOSIS — Z7189 Other specified counseling: Secondary | ICD-10-CM | POA: Insufficient documentation

## 2018-07-14 DIAGNOSIS — Z0000 Encounter for general adult medical examination without abnormal findings: Secondary | ICD-10-CM | POA: Insufficient documentation

## 2018-07-14 DIAGNOSIS — R432 Parageusia: Secondary | ICD-10-CM | POA: Insufficient documentation

## 2018-07-14 DIAGNOSIS — K5900 Constipation, unspecified: Secondary | ICD-10-CM | POA: Insufficient documentation

## 2018-07-14 DIAGNOSIS — F339 Major depressive disorder, recurrent, unspecified: Secondary | ICD-10-CM | POA: Insufficient documentation

## 2018-07-14 NOTE — Assessment & Plan Note (Signed)
Mood per psych.  I'll defer.  She had seen psych yesterday.

## 2018-07-14 NOTE — Assessment & Plan Note (Signed)
She is on trulance per [facility].  I'll defer.  She agrees.

## 2018-07-14 NOTE — Assessment & Plan Note (Signed)
Lipids are reasonable.  Continue as is.  She agrees.  Labs discussed with patient.

## 2018-07-14 NOTE — Assessment & Plan Note (Signed)
She'll consider options for a designee if she were incapacitated.

## 2018-07-14 NOTE — Assessment & Plan Note (Signed)
I do not see an obvious cause for the taste changes.  No oral lesions seen.  She can observe for now.  Update me as needed.  She agrees.

## 2018-07-14 NOTE — Assessment & Plan Note (Addendum)
Likely multifactorial.  No change in meds at this point.  She is going to call about pulmonary evaluation regarding snoring/possible obstructive sleep apnea.  I will defer for now.  She agrees. >25 minutes spent in face to face time with patient, >50% spent in counselling or coordination of care.

## 2018-07-14 NOTE — Assessment & Plan Note (Signed)
Mammogram and breast exam per [name].   Pelvic and pap per [facility].   Colonoscopy 2018 She'll consider options for a designee if she were incapacitated.

## 2018-08-05 ENCOUNTER — Institutional Professional Consult (permissible substitution): Payer: Self-pay | Admitting: Internal Medicine

## 2018-08-14 ENCOUNTER — Telehealth: Payer: Self-pay

## 2018-08-14 NOTE — Telephone Encounter (Signed)
Pt states [facility] took her off Progesterone. She is still having hot flashes really bad. [phone].

## 2018-08-15 ENCOUNTER — Other Ambulatory Visit: Payer: Self-pay | Admitting: Obstetrics and Gynecology

## 2018-08-15 DIAGNOSIS — Z79890 Hormone replacement therapy (postmenopausal): Secondary | ICD-10-CM

## 2018-08-15 DIAGNOSIS — N951 Menopausal and female climacteric states: Secondary | ICD-10-CM

## 2018-08-15 MED ORDER — PROGESTERONE MICRONIZED 100 MG PO CAPS: 100.0000 mg | ORAL_CAPSULE | Freq: Every day | ORAL | 0 refills | Status: DC

## 2018-08-15 NOTE — Telephone Encounter (Signed)
She would like to start it now. If you can send it to the [place] [facility] please.

## 2018-08-15 NOTE — Telephone Encounter (Signed)
She can restart progesterone now or discuss at 10/16 annual. Pt pref? RN to discuss with pt.

## 2018-08-15 NOTE — Progress Notes (Signed)
Rx RF prometrium for night sweats. Has 10/19 appt.

## 2018-08-15 NOTE — Telephone Encounter (Signed)
Rx eRxd.  

## 2018-09-04 ENCOUNTER — Ambulatory Visit: Payer: PPO | Admitting: Internal Medicine

## 2018-09-04 ENCOUNTER — Encounter: Payer: Self-pay | Admitting: Internal Medicine

## 2018-09-04 VITALS — BP 110/62 | HR 75 | Resp 16 | Ht 66.25 in | Wt 147.0 lb

## 2018-09-04 DIAGNOSIS — G4719 Other hypersomnia: Secondary | ICD-10-CM

## 2018-09-04 NOTE — Patient Instructions (Addendum)

## 2018-09-04 NOTE — Progress Notes (Signed)
[facility] Consultation      Assessment and Plan:  Excessive daytime sleepiness. -Symptoms and signs of obstructive sleep apnea. - We will send for sleep study.  Anxiety, fibromyalgia. - Obstructive sleep apnea can contribute to above conditions, therefore treatment of sleep apnea is important part of their management. -Patient is on several medications which could be contributing to daytime sleepiness, if her sleep study is negative, recommend a review of these medications to see if they can be reduced.   Date: 09/04/2018  MRN# [id number] JORIE ZEE 1956/05/06   Stephany A Koffler is a 62 y.o. old female seen in consultation for chief complaint of:    Chief Complaint  Patient presents with  . Consult    referred by Dr. Elsie Stain for eval of possible sleep apnea:  . daytime fatigue    patient told pcp she is always tired.    HPI:  She has noted that she is tired a lot, she has been tried on B12 which helped somewhat. She snores at night, when waking in the AM she feels tired. She has a lot of stress, and she thinks that may be making her tired.  She goes to bed at 9, falls asleep quickly, wakes at 7.  She sleeps on 2 pillows because she breathes better.  She has upper and lower dentures.   Her medications include Klonopin, 0.5 mg twice daily, Cymbalta 60 mg daily, Neurontin 100 to 200 mg 3 times daily, Robaxin 500 mg less than once per week,  Claritin 10 mg once daily.   PMHX:   Past Medical History:  Diagnosis Date  . Anxiety    sees Dr. [name]  . Cyst of breast    per [name]  . Fibromyalgia   . Hemorrhoids   . Hiatal hernia   . Hyperlipidemia   . IBS (irritable bowel syndrome)    constipation predominant  . IC (interstitial cystitis)    per [facility]  . MVP (mitral valve prolapse)    [facility] cards eval 2012  . Vitamin D deficiency    history of   Surgical Hx:  Past Surgical History:  Procedure Laterality Date  .  ABDOMINAL HYSTERECTOMY    . BREAST EXCISIONAL BIOPSY Right    benign  . BREAST EXCISIONAL BIOPSY Bilateral    benign  . COLONOSCOPY  09/2014   Dr. [name]  . excision of breast cysts     hx of multiple cyst aspirations  . EXPLORATORY LAPAROTOMY    . TUBAL LIGATION     Family Hx:  Family History  Problem Relation Age of Onset  . Breast cancer Mother 66  . Colon cancer Father 71  . Ovarian cancer Maternal Aunt 80  . Breast cancer Cousin 25  . Gallbladder disease Paternal Grandmother    Social Hx:   Social History   Tobacco Use  . Smoking status: Never Smoker  . Smokeless tobacco: Never Used  Substance Use Topics  . Alcohol use: No    Alcohol/week: 0.0 standard drinks  . Drug use: No   Medication:    Current Outpatient Medications:  .  atorvastatin (LIPITOR) 40 MG tablet, Take 40 mg by mouth daily., Disp: , Rfl: 1 .  cholecalciferol (VITAMIN D) 1000 units tablet, Take 1,000 Units by mouth daily., Disp: , Rfl:  .  clonazePAM (KLONOPIN) 0.5 MG tablet, Take 0.5 mg by mouth 2 (two) times daily. , Disp: , Rfl:  .  DULoxetine (CYMBALTA) 60 MG  capsule, Take 60 mg by mouth daily., Disp: , Rfl:  .  gabapentin (NEURONTIN) 100 MG capsule, Take 1-2 capsules (100-200 mg total) by mouth 3 (three) times daily as needed., Disp: 90 capsule, Rfl: 1 .  guaiFENesin (MUCUS RELIEF CHEST CONGESTION) 200 MG tablet, Take 200 mg by mouth every 4 (four) hours as needed for cough or to loosen phlegm., Disp: , Rfl:  .  loratadine (CLARITIN) 10 MG tablet, Take 1 tablet (10 mg total) by mouth daily., Disp: , Rfl:  .  methocarbamol (ROBAXIN) 500 MG tablet, Take 1 tablet (500 mg total) by mouth 4 (four) times daily., Disp: 40 tablet, Rfl: 1 .  omeprazole (PRILOSEC) 20 MG capsule, Take 20 mg by mouth 2 (two) times daily before a meal. BRAND NAME ONLY, Disp: , Rfl:  .  Plecanatide (TRULANCE) 3 MG TABS, Take by mouth., Disp: , Rfl:  .  progesterone (PROMETRIUM) 100 MG capsule, Take 1 capsule (100 mg total) by  mouth at bedtime. 6 nights on, 1 night off, Disp: 30 capsule, Rfl: 0 .  Triamcinolone Acetonide (NASACORT AQ NA), Place into the nose at bedtime., Disp: , Rfl:  .  valACYclovir (VALTREX) 500 MG tablet, Take 1 tablet (500 mg total) by mouth daily., Disp: 30 tablet, Rfl: 2 .  vitamin B-12 (CYANOCOBALAMIN) 1000 MCG tablet, Take 1 tablet (1,000 mcg total) by mouth daily., Disp: , Rfl:    Allergies:  Amitiza [lubiprostone]; Bentyl [dicyclomine hcl]; Buspar [buspirone]; Celecoxib; Cortisone; Doxycycline; Mobic [meloxicam]; Nitrofurantoin monohyd macro; Penicillins; Sulfonamide derivatives; and Zoloft [sertraline hcl]  Review of Systems: Gen:  Denies  fever, sweats, chills HEENT: Denies blurred vision, double vision. bleeds, sore throat Cvc:  No dizziness, chest pain. Resp:   Denies cough or sputum production, shortness of breath Gi: Denies swallowing difficulty, stomach pain. Gu:  Denies bladder incontinence, burning urine Ext:   No Joint pain, stiffness. Skin: No skin rash,  hives  Endoc:  No polyuria, polydipsia. Psych: No depression, insomnia. Other:  All other systems were reviewed with the patient and were negative other that what is mentioned in the HPI.   Physical Examination:   VS: BP 110/62 (BP Location: Left Arm, Cuff Size: Normal)   Pulse 75   Resp 16   Ht 5' 6.25" (1.683 m)   Wt 147 lb (66.7 kg)   SpO2 99%   BMI 23.55 kg/m   General Appearance: No distress  Neuro:without focal findings,  speech normal,  HEENT: PERRLA, EOM intact.   Pulmonary: normal breath sounds, No wheezing.  CardiovascularNormal S1,S2.  No m/r/g.   Abdomen: Benign, Soft, non-tender. Renal:  No costovertebral tenderness  GU:  No performed at this time. Endoc: No evident thyromegaly, no signs of acromegaly. Skin:   warm, no rashes, no ecchymosis  Extremities: normal, no cyanosis, clubbing.  Other findings:    LABORATORY PANEL:   CBC No results for input(s): WBC, HGB, HCT, PLT in the last 168  hours. ------------------------------------------------------------------------------------------------------------------  Chemistries  No results for input(s): NA, K, CL, CO2, GLUCOSE, BUN, CREATININE, CALCIUM, MG, AST, ALT, ALKPHOS, BILITOT in the last 168 hours.  Invalid input(s): GFRCGP ------------------------------------------------------------------------------------------------------------------  Cardiac Enzymes No results for input(s): TROPONINI in the last 168 hours. ------------------------------------------------------------  RADIOLOGY:  No results found.     Thank  you for the consultation and for allowing [facility] to assist in the care of your patient. Our recommendations are noted above.  Please contact us if we can be of further service.  Marda Stalker, M.D., F.C.C.P.  Board Certified in Internal Medicine, Pulmonary Medicine, [place], and Sleep Medicine.  [facility] Office Number: [phone]   09/04/2018

## 2018-09-11 ENCOUNTER — Encounter: Payer: Self-pay | Admitting: Obstetrics and Gynecology

## 2018-09-11 ENCOUNTER — Ambulatory Visit (INDEPENDENT_AMBULATORY_CARE_PROVIDER_SITE_OTHER): Payer: PPO | Admitting: Obstetrics and Gynecology

## 2018-09-11 ENCOUNTER — Other Ambulatory Visit (HOSPITAL_COMMUNITY)
Admission: RE | Admit: 2018-09-11 | Discharge: 2018-09-11 | Disposition: A | Discharge status: Home / Self Care | Payer: PPO | Source: Ambulatory Visit | Attending: Obstetrics and Gynecology | Admitting: Obstetrics and Gynecology

## 2018-09-11 ENCOUNTER — Telehealth: Payer: Self-pay

## 2018-09-11 VITALS — BP 144/64 | HR 85 | Ht 66.0 in | Wt 148.0 lb

## 2018-09-11 DIAGNOSIS — N951 Menopausal and female climacteric states: Secondary | ICD-10-CM | POA: Diagnosis not present

## 2018-09-11 DIAGNOSIS — Z124 Encounter for screening for malignant neoplasm of cervix: Secondary | ICD-10-CM

## 2018-09-11 DIAGNOSIS — A6004 Herpesviral vulvovaginitis: Secondary | ICD-10-CM

## 2018-09-11 DIAGNOSIS — Z01419 Encounter for gynecological examination (general) (routine) without abnormal findings: Secondary | ICD-10-CM

## 2018-09-11 DIAGNOSIS — Z79890 Hormone replacement therapy (postmenopausal): Secondary | ICD-10-CM

## 2018-09-11 DIAGNOSIS — Z01411 Encounter for gynecological examination (general) (routine) with abnormal findings: Secondary | ICD-10-CM

## 2018-09-11 DIAGNOSIS — Z1239 Encounter for other screening for malignant neoplasm of breast: Secondary | ICD-10-CM

## 2018-09-11 MED ORDER — VALACYCLOVIR HCL 500 MG PO TABS: 500.0000 mg | ORAL_TABLET | Freq: Two times a day (BID) | ORAL | 2 refills | Status: DC

## 2018-09-11 MED ORDER — PROGESTERONE MICRONIZED 100 MG PO CAPS: 100.0000 mg | ORAL_CAPSULE | Freq: Every day | ORAL | 3 refills | Status: DC

## 2018-09-11 NOTE — Telephone Encounter (Signed)
Pt calling states that she saw ABC today and wondering if she needed to get scheduled for density scan, please advise

## 2018-09-11 NOTE — Telephone Encounter (Signed)
No. Given mild bone loss 2017, insurance will pay starting age 62. Don't need to do sooner.

## 2018-09-11 NOTE — Telephone Encounter (Signed)
Please advise 

## 2018-09-11 NOTE — Patient Instructions (Signed)
I value your feedback and entrusting us with your care. If you get a [facility] patient survey, I would appreciate you taking the time to let us know about your experience today. Thank you! 

## 2018-09-11 NOTE — Telephone Encounter (Signed)
Pt aware.

## 2018-09-11 NOTE — Progress Notes (Signed)
PCP: Tonia Ghent, MD   Chief Complaint  Patient presents with  . Gynecologic Exam    Medicare annual     HPI:      Debra Little is a 62 y.o. G1P1 who LMP was No LMP recorded. Patient has had a hysterectomy., presents today for Medicare annual.  Her menses are absent due to [facility] 2002 for endometriosis, and she is postmenopausal. She does not have intermenstrual bleeding. Has occas achy, intermittent LLQ pain. Hx of constipation and that may be trigger for sx, but constipation improved with new med.  She does have vasomotor sx. She uses prometrium 100 mg caps 6 nights on, 1 night off. She tried to come off earlier this yr but sx resumed. Sx improved once she restarted prometrium. Wants to cont.  Sex activity: single partner, contraception - post menopausal status. She does not have vaginal dryness.  Last Pap: 08/16/16 Results were: no abnormalities ; neg HPV DNA 2015. Hx of HSV, takes valtrex daily. No recent outbreaks. Needs Rx RF.   Last mammogram: 01/14/18 Results were: normal--routine follow-up in 12 months. Followed by [name].  There is a FH of breast cancer in her mother. There is a FH of ovarian cancer in her mat aunt. Pt's ins denied cancer genetic testing in the past and now she has medicare, but she doesn't meet their testing guidelines. The patient does do self-breast exams.  Colonoscopy: colonoscopy 1 year ago with abnormalities. . Repeat due after 5 years. Sees Dr. [name]. DEXA: 10/17--osteopenia in spine/fem neck.  Labs with PCP.  Past Medical History:  Diagnosis Date  . Anxiety    sees Dr. [name]  . Cyst of breast    per [name]  . Fibromyalgia   . GERD (gastroesophageal reflux disease)   . Hemorrhoids   . Herpes, genital 04/2015   confirmed with HSV 2 IgG  . Hiatal hernia   . Hypercholesterolemia    Dr. Kyra Searles  . Hyperlipidemia   . IBS (irritable bowel syndrome)    constipation predominant  . IC (interstitial cystitis)    per  [facility]  . Mild depression ([place])   . MVP (mitral valve prolapse)    [facility] cards eval 2012  . Osteopenia    2010/2017, DEXA at [facility];spine and fem neck  . Vitamin D deficiency    history of    Past Surgical History:  Procedure Laterality Date  . ABDOMINAL HYSTERECTOMY    . BLADDER SURGERY     1980's  . BREAST EXCISIONAL BIOPSY Right    benign  . BREAST EXCISIONAL BIOPSY Bilateral    benign  . COLONOSCOPY  09/2014   Dr. [name]  . COLONOSCOPY  2009   at [facility] (BENIGN)  . excision of breast cysts     hx of multiple cyst aspirations  . EXPLORATORY LAPAROTOMY    . TUBAL LIGATION      Family History  Problem Relation Age of Onset  . Breast cancer Mother 58  . Hypertension Mother   . Colon cancer Father 1  . Hyperlipidemia Father   . Ovarian cancer Maternal Aunt 80  . Breast cancer Cousin 19  . Gallbladder disease Paternal Grandmother   . Liver cancer Cousin 60       Malignant    Social History   Socioeconomic History  . Marital status: Divorced    Spouse name: Not on file  . Number of children: 1  . Years of education: Not  on file  . Highest education level: Not on file  Occupational History  . Occupation: [employer]  Social Needs  . Financial resource strain: Not on file  . Food insecurity:    Worry: Not on file    Inability: Not on file  . Transportation needs:    Medical: Not on file    Non-medical: Not on file  Tobacco Use  . Smoking status: Never Smoker  . Smokeless tobacco: Never Used  Substance and Sexual Activity  . Alcohol use: No    Alcohol/week: 0.0 standard drinks  . Drug use: No  . Sexual activity: Yes    Birth control/protection: Surgical    Comment: Hysterectomy  Lifestyle  . Physical activity:    Days per week: Not on file    Minutes per session: Not on file  . Stress: Not on file  Relationships  . Social connections:    Talks on phone: Not on file    Gets together: Not on file    Attends religious service: Not on  file    Active member of club or organization: Not on file    Attends meetings of clubs or organizations: Not on file    Relationship status: Not on file  . Intimate partner violence:    Fear of current or ex partner: Not on file    Emotionally abused: Not on file    Physically abused: Not on file    Forced sexual activity: Not on file  Other Topics Concern  . Not on file  Social History Narrative   Married in 1998, divorced as of 2018, 1 son from previous relationship   Brother with MVA at 28, in rest home for many years as of 2019   Current Meds  Medication Sig  . atorvastatin (LIPITOR) 40 MG tablet Take 40 mg by mouth daily.  . cholecalciferol (VITAMIN D) 1000 units tablet Take 1,000 Units by mouth daily.  . clonazePAM (KLONOPIN) 0.5 MG tablet Take 0.5 mg by mouth 2 (two) times daily.   . DULoxetine (CYMBALTA) 60 MG capsule Take 60 mg by mouth daily.  [name] gabapentin (NEURONTIN) 100 MG capsule Take 1-2 capsules (100-200 mg total) by mouth 3 (three) times daily as needed.  [name] HYDROcodone-acetaminophen (NORCO/VICODIN) 5-325 MG tablet Take by mouth.  . loratadine (CLARITIN) 10 MG tablet Take 1 tablet (10 mg total) by mouth daily.  . methocarbamol (ROBAXIN) 500 MG tablet Take 1 tablet (500 mg total) by mouth 4 (four) times daily.  [name] omeprazole (PRILOSEC) 20 MG capsule Take 20 mg by mouth 2 (two) times daily before a meal. BRAND NAME ONLY  . Plecanatide (TRULANCE) 3 MG TABS Take by mouth.  . progesterone (PROMETRIUM) 100 MG capsule Take 1 capsule (100 mg total) by mouth at bedtime. 6 nights on, 1 night off  . Triamcinolone Acetonide (NASACORT AQ NA) Place into the nose at bedtime.  . valACYclovir (VALTREX) 500 MG tablet Take 1 tablet (500 mg total) by mouth 2 (two) times daily. For 3 days prn sx  . vitamin B-12 (CYANOCOBALAMIN) 1000 MCG tablet Take 1 tablet (1,000 mcg total) by mouth daily.  . [DISCONTINUED] progesterone (PROMETRIUM) 100 MG capsule Take 1 capsule (100 mg total) by mouth at  bedtime. 6 nights on, 1 night off  . [DISCONTINUED] valACYclovir (VALTREX) 500 MG tablet Take 1 tablet (500 mg total) by mouth daily.    ROS:  Review of Systems  Constitutional: Positive for fatigue. Negative for fever and unexpected weight change.  Respiratory:  Negative for cough, shortness of breath and wheezing.   Cardiovascular: Negative for chest pain, palpitations and leg swelling.  Gastrointestinal: Positive for constipation. Negative for blood in stool, diarrhea, nausea and vomiting.  Endocrine: Negative for cold intolerance, heat intolerance and polyuria.  Genitourinary: Positive for frequency. Negative for dyspareunia, dysuria, flank pain, genital sores, hematuria, menstrual problem, pelvic pain, urgency, vaginal bleeding, vaginal discharge and vaginal pain.  Musculoskeletal: Positive for arthralgias. Negative for back pain, joint swelling and myalgias.  Skin: Negative for rash.  Neurological: Negative for dizziness, syncope, light-headedness, numbness and headaches.  Hematological: Negative for adenopathy.  Psychiatric/Behavioral: Positive for agitation and dysphoric mood. Negative for confusion, sleep disturbance and suicidal ideas. The patient is not nervous/anxious.      Objective: BP (!) 144/64   Pulse 85   Ht 5\' 6"  (1.676 m)   Wt 148 lb (67.1 kg)   BMI 23.89 kg/m    Physical Exam  Constitutional: She is oriented to person, place, and time. She appears well-developed and well-nourished.  Genitourinary: Vagina normal. There is no rash or tenderness on the right labia. There is no rash or tenderness on the left labia. No erythema or tenderness in the vagina. No vaginal discharge found. Right adnexum does not display mass and does not display tenderness.  Left adnexum displays tenderness. Left adnexum does not display mass.  Genitourinary Comments: UTERUS/CX SURG REM  Neck: Normal range of motion. No thyromegaly present.  Cardiovascular: Normal rate, regular rhythm and  normal heart sounds.  No murmur heard. Pulmonary/Chest: Effort normal and breath sounds normal. Right breast exhibits no mass, no nipple discharge, no skin change and no tenderness. Left breast exhibits no mass, no nipple discharge, no skin change and no tenderness.  Abdominal: Soft. There is no tenderness. There is no rigidity and no guarding.  Musculoskeletal: Normal range of motion.  Neurological: She is alert and oriented to person, place, and time. No cranial nerve deficit.  Psychiatric: She has a normal mood and affect. Her behavior is normal. Thought content normal.  Vitals reviewed.   Assessment/Plan:  Encounter for annual routine gynecological examination  Cervical cancer screening - Plan: Cytology - PAP  Screening for breast cancer - Pt followed by [name]  Herpes simplex vulvovaginitis - Rx RF valtrex.  - Plan: valACYclovir (VALTREX) 500 MG tablet  Hormone replacement therapy (HRT) - Rx RF prometrium. F/u prn.   Vasomotor symptoms due to menopause - Sx returned after weaning off. Re-eval next yr. - Plan: progesterone (PROMETRIUM) 100 MG capsule   Meds ordered this encounter  Medications  . valACYclovir (VALTREX) 500 MG tablet    Sig: Take 1 tablet (500 mg total) by mouth 2 (two) times daily. For 3 days prn sx    Dispense:  30 tablet    Refill:  2    Order Specific Question:   Supervising Provider    Answer:   Gae Dry [id number]  . progesterone (PROMETRIUM) 100 MG capsule    Sig: Take 1 capsule (100 mg total) by mouth at bedtime. 6 nights on, 1 night off    Dispense:  90 capsule    Refill:  3    Order Specific Question:   Supervising Provider    Answer:   Gae Dry [[id number]]            GYN counsel mammography screening, use and side effects of HRT, diet and exercise    F/U  Return in about 2 years (around 09/11/2020).  Alicia B.  Copland, PA-C 09/11/2018 2:26 PM

## 2018-09-16 DIAGNOSIS — G4710 Hypersomnia, unspecified: Secondary | ICD-10-CM | POA: Diagnosis not present

## 2018-09-16 LAB — CYTOLOGY - PAP: Diagnosis: NEGATIVE

## 2018-09-20 DIAGNOSIS — G4710 Hypersomnia, unspecified: Secondary | ICD-10-CM | POA: Diagnosis not present

## 2018-09-23 ENCOUNTER — Telehealth: Payer: Self-pay | Admitting: *Deleted

## 2018-09-23 NOTE — Telephone Encounter (Signed)
HST normal sleep apnea not found. Pt aware.

## 2018-10-11 DIAGNOSIS — R002 Palpitations: Secondary | ICD-10-CM | POA: Diagnosis not present

## 2018-10-11 DIAGNOSIS — E7801 Familial hypercholesterolemia: Secondary | ICD-10-CM | POA: Diagnosis not present

## 2018-10-11 DIAGNOSIS — E782 Mixed hyperlipidemia: Secondary | ICD-10-CM | POA: Diagnosis not present

## 2018-10-11 DIAGNOSIS — R079 Chest pain, unspecified: Secondary | ICD-10-CM | POA: Diagnosis not present

## 2018-10-11 DIAGNOSIS — I341 Nonrheumatic mitral (valve) prolapse: Secondary | ICD-10-CM | POA: Diagnosis not present

## 2018-10-17 DIAGNOSIS — H6123 Impacted cerumen, bilateral: Secondary | ICD-10-CM | POA: Diagnosis not present

## 2018-10-17 DIAGNOSIS — H902 Conductive hearing loss, unspecified: Secondary | ICD-10-CM | POA: Diagnosis not present

## 2018-11-11 ENCOUNTER — Other Ambulatory Visit: Payer: Self-pay | Admitting: Obstetrics and Gynecology

## 2018-11-11 ENCOUNTER — Telehealth: Payer: Self-pay

## 2018-11-11 DIAGNOSIS — A6004 Herpesviral vulvovaginitis: Secondary | ICD-10-CM

## 2018-11-11 MED ORDER — VALACYCLOVIR HCL 500 MG PO TABS: 500.0000 mg | ORAL_TABLET | Freq: Every day | ORAL | 3 refills | Status: DC

## 2018-11-11 NOTE — Telephone Encounter (Signed)
Rx valtrex changed and eRxd. Pt is NOT to skip one once a week. Needs to take daily. RN to notify pt.

## 2018-11-11 NOTE — Progress Notes (Signed)
Rx Valtrex change to daily from episodic.

## 2018-11-11 NOTE — Telephone Encounter (Signed)
Pt was given different directions on her valtrex than what she's been taking.  She has been taking daily and skip one day during the week.  New directions are BID x3d as needed.  Pt needs directions changed to daily and skip one day during the week.  She also needs refill.  [phone]

## 2018-11-11 NOTE — Telephone Encounter (Signed)
Please advise 

## 2018-11-11 NOTE — Telephone Encounter (Signed)
Pt aware.

## 2018-11-13 DIAGNOSIS — F410 Panic disorder [episodic paroxysmal anxiety] without agoraphobia: Secondary | ICD-10-CM | POA: Diagnosis not present

## 2018-11-13 DIAGNOSIS — F330 Major depressive disorder, recurrent, mild: Secondary | ICD-10-CM | POA: Diagnosis not present

## 2018-11-13 DIAGNOSIS — F411 Generalized anxiety disorder: Secondary | ICD-10-CM | POA: Diagnosis not present

## 2018-11-14 DIAGNOSIS — F330 Major depressive disorder, recurrent, mild: Secondary | ICD-10-CM | POA: Diagnosis not present

## 2018-11-14 DIAGNOSIS — F411 Generalized anxiety disorder: Secondary | ICD-10-CM | POA: Diagnosis not present

## 2018-11-27 DIAGNOSIS — C50919 Malignant neoplasm of unspecified site of unspecified female breast: Secondary | ICD-10-CM

## 2018-11-27 HISTORY — DX: Malignant neoplasm of unspecified site of unspecified female breast: C50.919

## 2018-12-09 ENCOUNTER — Other Ambulatory Visit: Payer: Self-pay | Admitting: Family Medicine

## 2018-12-09 DIAGNOSIS — Z1231 Encounter for screening mammogram for malignant neoplasm of breast: Secondary | ICD-10-CM

## 2018-12-10 ENCOUNTER — Other Ambulatory Visit: Payer: Self-pay | Admitting: *Deleted

## 2018-12-10 DIAGNOSIS — G4719 Other hypersomnia: Secondary | ICD-10-CM

## 2018-12-12 ENCOUNTER — Encounter: Payer: Self-pay | Admitting: Family Medicine

## 2018-12-12 ENCOUNTER — Ambulatory Visit (INDEPENDENT_AMBULATORY_CARE_PROVIDER_SITE_OTHER): Payer: Medicare HMO | Admitting: Family Medicine

## 2018-12-12 DIAGNOSIS — R5383 Other fatigue: Secondary | ICD-10-CM

## 2018-12-12 NOTE — Patient Instructions (Signed)
I suspect your stressors (ex: brother's concerns) contribute to your current situation/symptoms. You could try cutting back on gabapentin to see if that helps with fatigue.  It is still reason to talk to cardiology about the shortness of breath but I don't suspect a cardiac cause.   Try to gradually get back to walking for exercise.  Take care.  Glad to see you.

## 2018-12-12 NOTE — Progress Notes (Signed)
SOB and exhausted.  She hasn't been walking for exercise.   SOB relieved with rest.  She'll have to rest after making the bed.  Recent stress test done, below.  No BLE edema.  She sleeps on 1-2 pillows at baseline but isn't SOB laying down.   She has cards f/u pending for later this spring.    Higher stress life events noted.  She is living with a friend who is needing extra help/medicare care.  She is awaiting an update about getting an apartment.    Her brother needs extra care and her ex husband is ill.    She can feel her heartbeat in her head when her BP is higher than typical for her.    We talked about her meds.  Unclear if fatigue is exacerbated by gabapentin, along with other meds.  She is taking on average 2 doses of gabapentin per day.   ============================ LVEF= 67% FINDINGS: Regional wall motion:reveals normal myocardial thickening and wall motion. The overall quality of the study is excellent. Artifacts noted: no Left ventricular cavity: normal. ============================  She may have had an episode of low sugar, resolved with a snack, but that seems to be an unrelated issue.  Per patient report she had neg sleep study.    PMH and SH reviewed  ROS: Per HPI unless specifically indicated in ROS section  No fevers chills nausea vomiting.  No new rash.  See above.  Meds, vitals, and allergies reviewed.   GEN: nad, alert and oriented HEENT: mucous membranes moist NECK: supple w/o LA CV: rrr PULM: ctab, no inc wob ABD: soft, +bs EXT: no edema SKIN: no acute rash

## 2018-12-15 ENCOUNTER — Encounter: Payer: Self-pay | Admitting: Family Medicine

## 2018-12-15 NOTE — Assessment & Plan Note (Addendum)
Likely multifactorial issue I suspect her stressors (ex: brother's concerns) contribute to her current situation/symptoms. She could try cutting back on gabapentin to see if that helps with fatigue.  Reasonable for her to talk to cardiology (as FYI to them) about the shortness of breath but I don't suspect a cardiac cause.   Reasonable to try to gradually get back to walking for exercise.  >25 minutes spent in face to face time with patient, >50% spent in counselling or coordination of care

## 2019-01-07 ENCOUNTER — Other Ambulatory Visit: Payer: Self-pay

## 2019-01-07 DIAGNOSIS — A6004 Herpesviral vulvovaginitis: Secondary | ICD-10-CM

## 2019-01-07 MED ORDER — VALACYCLOVIR HCL 500 MG PO TABS: 500.0000 mg | ORAL_TABLET | Freq: Every day | ORAL | 3 refills | Status: DC

## 2019-01-07 NOTE — Telephone Encounter (Signed)
Landon from the [facility] called to request a refill for pt valtrex. rx refilled pt had annual on 08/2018

## 2019-01-09 ENCOUNTER — Ambulatory Visit (INDEPENDENT_AMBULATORY_CARE_PROVIDER_SITE_OTHER): Payer: Medicare HMO | Admitting: Family Medicine

## 2019-01-09 ENCOUNTER — Encounter: Payer: Self-pay | Admitting: Family Medicine

## 2019-01-09 VITALS — BP 124/68 | HR 72 | Temp 97.7°F | Ht 66.0 in | Wt 147.5 lb

## 2019-01-09 DIAGNOSIS — R300 Dysuria: Secondary | ICD-10-CM | POA: Diagnosis not present

## 2019-01-09 DIAGNOSIS — N3000 Acute cystitis without hematuria: Secondary | ICD-10-CM

## 2019-01-09 LAB — POCT URINALYSIS DIPSTICK
Bilirubin, UA: NEGATIVE
Blood, UA: NEGATIVE
Glucose, UA: NEGATIVE
Ketones, UA: NEGATIVE
Nitrite, UA: POSITIVE
Protein, UA: NEGATIVE
Spec Grav, UA: <=1.005 — AB (ref 1.010–1.025)
Urobilinogen, UA: 1 E.U./dL
pH, UA: 6 (ref 5.0–8.0)

## 2019-01-09 MED ORDER — CIPROFLOXACIN HCL 250 MG PO TABS: 250.0000 mg | ORAL_TABLET | Freq: Two times a day (BID) | ORAL | 0 refills | Status: DC

## 2019-01-09 NOTE — Patient Instructions (Signed)
Urinary tract - pick up the antibiotic - will call you with the results of the culture but this may not be back until Monday

## 2019-01-09 NOTE — Progress Notes (Signed)
Subjective:     Debra Little is a 63 y.o. female presenting for Urinary Urgency (symptoms started this morning, Burning with urination, lower back pain, pain with urination. Patient does have interstitial cystitis history. patient had some chills when she got up this morning but did not check her temperature. Took AZO today.)     Dysuria   This is a new problem. The current episode started yesterday. The problem occurs every urination. The problem has been gradually improving. The quality of the pain is described as burning. There has been no fever. She is not sexually active. There is no history of pyelonephritis. Associated symptoms include chills, flank pain, frequency, hesitancy and urgency. Pertinent negatives include no hematuria, nausea or vomiting. Treatments tried: azo. The treatment provided mild relief. interstitial cystitis - years ago   Has been having some back pain for a while  "siatic pain" Chronic issue for patient - treats with tylenol   Review of Systems  Constitutional: Positive for chills.  Gastrointestinal: Negative for nausea and vomiting.  Genitourinary: Positive for dysuria, flank pain, frequency, hesitancy and urgency. Negative for hematuria.     Social History   Tobacco Use  Smoking Status Never Smoker  Smokeless Tobacco Never Used        Objective:    BP Readings from Last 3 Encounters:  01/09/19 124/68  12/12/18 132/80  09/11/18 (!) 144/64   Wt Readings from Last 3 Encounters:  01/09/19 147 lb 8 oz (66.9 kg)  12/12/18 149 lb 8 oz (67.8 kg)  09/11/18 148 lb (67.1 kg)    BP 124/68   Pulse 72   Temp 97.7 F (36.5 C)   Ht 5\' 6"  (1.676 m)   Wt 147 lb 8 oz (66.9 kg)   SpO2 98%   BMI 23.81 kg/m    Physical Exam Constitutional:      General: She is not in acute distress.    Appearance: She is well-developed. She is not diaphoretic.  HENT:     Right Ear: External ear normal.     Left Ear: External ear normal.     Nose: Nose  normal.  Eyes:     Conjunctiva/sclera: Conjunctivae normal.  Neck:     Musculoskeletal: Neck supple.  Cardiovascular:     Rate and Rhythm: Normal rate and regular rhythm.     Heart sounds: No murmur.  Pulmonary:     Effort: Pulmonary effort is normal.     Breath sounds: Normal breath sounds.  Abdominal:     General: Bowel sounds are normal. There is no distension.     Tenderness: There is abdominal tenderness in the suprapubic area. There is no right CVA tenderness or left CVA tenderness.  Skin:    General: Skin is warm and dry.     Capillary Refill: Capillary refill takes less than 2 seconds.  Neurological:     Mental Status: She is alert. Mental status is at baseline.  Psychiatric:        Mood and Affect: Mood normal.        Behavior: Behavior normal.       UA: + LE, +nitrities    Assessment & Plan:   Problem List Items Addressed This Visit    None    Visit Diagnoses    Acute cystitis without hematuria    -  Primary   Relevant Medications   ciprofloxacin (CIPRO) 250 MG tablet   Other Relevant Orders   Urine culture   Dysuria  Relevant Orders   POCT urinalysis dipstick     UA consistent with infection. Long discussion about the risks of Cipro but with allergies to PCN, Bactrim, and nitrofurantoin she has limited options.   Discussed that she could wait for culture of start abx today.  Return if symptoms worsen or fail to improve.  Lesleigh Noe, MD

## 2019-01-10 ENCOUNTER — Ambulatory Visit: Payer: Self-pay | Admitting: Family Medicine

## 2019-01-10 LAB — URINE CULTURE
MICRO NUMBER:: 191849
SPECIMEN QUALITY:: ADEQUATE

## 2019-01-15 ENCOUNTER — Ambulatory Visit
Admission: RE | Admit: 2019-01-15 | Discharge: 2019-01-15 | Disposition: A | Discharge status: Home / Self Care | Payer: Medicare HMO | Source: Ambulatory Visit | Attending: Family Medicine | Admitting: Family Medicine

## 2019-01-15 DIAGNOSIS — Z1231 Encounter for screening mammogram for malignant neoplasm of breast: Secondary | ICD-10-CM | POA: Diagnosis not present

## 2019-01-22 ENCOUNTER — Telehealth: Payer: Self-pay

## 2019-01-22 NOTE — Telephone Encounter (Signed)
Pt aware. She said she googled and she read it causes weight gain. Pt says she might as well stay on them since its not related to weight gain then.

## 2019-01-22 NOTE — Telephone Encounter (Signed)
Pt has been taking progesterone 100mg ; has gained 10lbs.  Can she try going off of them again?  [phone]

## 2019-01-22 NOTE — Telephone Encounter (Signed)
She can wean off them any time she would like. Not related to wt gain. RN to notify pt.

## 2019-01-22 NOTE — Telephone Encounter (Signed)
Please advise.c

## 2019-01-28 DIAGNOSIS — R350 Frequency of micturition: Secondary | ICD-10-CM | POA: Diagnosis not present

## 2019-01-28 DIAGNOSIS — N3946 Mixed incontinence: Secondary | ICD-10-CM | POA: Diagnosis not present

## 2019-03-14 ENCOUNTER — Telehealth: Payer: Self-pay | Admitting: Family Medicine

## 2019-03-14 NOTE — Telephone Encounter (Signed)
Place form in Dr Josefine Class inbox to review

## 2019-03-14 NOTE — Telephone Encounter (Signed)
Pt dropped off disability parking placard form to be  filled out by Dr. Damita Dunnings. Placed in [place] tower. Pt states you can call her to pick it up or it can be mailed to her. [phone]

## 2019-03-16 NOTE — Telephone Encounter (Signed)
Form done. Thanks. 

## 2019-03-17 NOTE — Telephone Encounter (Signed)
Patient notified form done.  Form mailed to patient.

## 2019-03-26 DIAGNOSIS — R69 Illness, unspecified: Secondary | ICD-10-CM | POA: Diagnosis not present

## 2019-03-26 DIAGNOSIS — F410 Panic disorder [episodic paroxysmal anxiety] without agoraphobia: Secondary | ICD-10-CM | POA: Diagnosis not present

## 2019-03-26 DIAGNOSIS — F330 Major depressive disorder, recurrent, mild: Secondary | ICD-10-CM | POA: Diagnosis not present

## 2019-04-07 DIAGNOSIS — E782 Mixed hyperlipidemia: Secondary | ICD-10-CM | POA: Diagnosis not present

## 2019-04-07 DIAGNOSIS — I341 Nonrheumatic mitral (valve) prolapse: Secondary | ICD-10-CM | POA: Diagnosis not present

## 2019-04-07 DIAGNOSIS — R002 Palpitations: Secondary | ICD-10-CM | POA: Diagnosis not present

## 2019-04-08 DIAGNOSIS — E782 Mixed hyperlipidemia: Secondary | ICD-10-CM | POA: Diagnosis not present

## 2019-04-08 DIAGNOSIS — R002 Palpitations: Secondary | ICD-10-CM | POA: Diagnosis not present

## 2019-04-08 DIAGNOSIS — I341 Nonrheumatic mitral (valve) prolapse: Secondary | ICD-10-CM | POA: Diagnosis not present

## 2019-04-11 DIAGNOSIS — R42 Dizziness and giddiness: Secondary | ICD-10-CM | POA: Diagnosis not present

## 2019-04-11 DIAGNOSIS — H6123 Impacted cerumen, bilateral: Secondary | ICD-10-CM | POA: Diagnosis not present

## 2019-04-23 DIAGNOSIS — I341 Nonrheumatic mitral (valve) prolapse: Secondary | ICD-10-CM | POA: Diagnosis not present

## 2019-04-23 DIAGNOSIS — R002 Palpitations: Secondary | ICD-10-CM | POA: Diagnosis not present

## 2019-05-12 ENCOUNTER — Other Ambulatory Visit: Payer: Self-pay

## 2019-05-12 ENCOUNTER — Ambulatory Visit (INDEPENDENT_AMBULATORY_CARE_PROVIDER_SITE_OTHER): Payer: Medicare HMO | Admitting: Family Medicine

## 2019-05-12 ENCOUNTER — Encounter: Payer: Self-pay | Admitting: Family Medicine

## 2019-05-12 DIAGNOSIS — F419 Anxiety disorder, unspecified: Secondary | ICD-10-CM

## 2019-05-12 DIAGNOSIS — R109 Unspecified abdominal pain: Secondary | ICD-10-CM

## 2019-05-12 DIAGNOSIS — R69 Illness, unspecified: Secondary | ICD-10-CM | POA: Diagnosis not present

## 2019-05-12 DIAGNOSIS — F329 Major depressive disorder, single episode, unspecified: Secondary | ICD-10-CM

## 2019-05-12 DIAGNOSIS — F32A Depression, unspecified: Secondary | ICD-10-CM
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
## 2019-05-12 NOTE — Patient Instructions (Addendum)
Let us check with [place]ville Silos in the meantime.  Increase the prilosec to twice a day for now.  Avoid greasy foods.   If the pain isn't better, then let me know.  You can take an extra gabapentin if needed, up from 1 day.  Sedation caution.  Take care.  Glad to see you.

## 2019-05-15 DIAGNOSIS — R109 Unspecified abdominal pain: Secondary | ICD-10-CM | POA: Insufficient documentation

## 2019-05-15 NOTE — Assessment & Plan Note (Signed)
Advised the following- Increase the prilosec to twice a day for now.  Avoid greasy foods.   If the pain isn't better, then let me know.  We can order right upper quadrant ultrasound to evaluate her gallbladder if she does not have decrease in symptoms with the above.

## 2019-05-15 NOTE — Assessment & Plan Note (Signed)
Advise patient the following- You can take an extra gabapentin if needed, up from 1 day.  Sedation caution.  We will check on counseling with patient, with Miguel Dibble.

## 2019-05-18 ENCOUNTER — Telehealth: Payer: Self-pay | Admitting: Family Medicine

## 2019-05-18 NOTE — Telephone Encounter (Signed)
-----   Message from Josetta Huddle, [place] sent at 05/15/2019  3:42 PM EDT ----- Patient has already spoken with Miguel Dibble this morning.  In person OV's are suspended right now but phone and Web visits are offered. ----- Message ----- From: Tonia Ghent, MD Sent: 05/15/2019  12:27 AM EDT To: Josetta Huddle, CMA  Please see if you can get through with Miguel Dibble at [facility] about following up with the patient.  I can talk to L[place] if needed.  Thanks.   [phone]

## 2019-05-18 NOTE — Telephone Encounter (Signed)
Noted. Thanks.

## 2019-05-18 NOTE — Telephone Encounter (Signed)
Opened in error

## 2019-05-19 DIAGNOSIS — F411 Generalized anxiety disorder: Secondary | ICD-10-CM | POA: Diagnosis not present

## 2019-05-19 DIAGNOSIS — R69 Illness, unspecified: Secondary | ICD-10-CM | POA: Diagnosis not present

## 2019-05-26 ENCOUNTER — Telehealth: Payer: Self-pay | Admitting: Family Medicine

## 2019-05-26 NOTE — Telephone Encounter (Signed)
I spoke with pt; for 3 days yellow vaginal discharge; perineal itching. No covid symptoms;no travel & no known exposure to + covid. No available appts today. Pt scheduled in office appt with Dr Einar Pheasant on 05/27/19 at 12 noon. FYI to Dr Damita Dunnings and Dr Einar Pheasant.

## 2019-05-26 NOTE — Telephone Encounter (Signed)
Please triage patient.  I see an old cipro rx per Dr. Einar Pheasant but no meds for yeast infection.  Thanks.

## 2019-05-26 NOTE — Telephone Encounter (Signed)
Agree with Dr. Damita Dunnings - I saw her for a UTI not a yeast infection.   Will evaluate tomorrow

## 2019-05-26 NOTE — Telephone Encounter (Signed)
Patient stated she was seen by Dr Einar Pheasant for a yeast infection a few months ago.  They sent in medication for this and the patient stated she would like to know if this could be sent in again or if she needs to start over with a new appt. Patient did not know the name of medication she was prescribed since it has been a couple months.  She stated that her yeast infection had improved but she stated she believes it is starting again.     Patient C/B #  [phone]     [place]

## 2019-05-27 ENCOUNTER — Encounter: Payer: Self-pay | Admitting: Family Medicine

## 2019-05-27 ENCOUNTER — Other Ambulatory Visit: Payer: Self-pay

## 2019-05-27 ENCOUNTER — Ambulatory Visit (INDEPENDENT_AMBULATORY_CARE_PROVIDER_SITE_OTHER): Payer: Medicare HMO | Admitting: Family Medicine

## 2019-05-27 VITALS — BP 104/68 | HR 69 | Temp 98.2°F | Ht 66.0 in | Wt 147.5 lb

## 2019-05-27 DIAGNOSIS — R300 Dysuria: Secondary | ICD-10-CM | POA: Diagnosis not present

## 2019-05-27 DIAGNOSIS — N898 Other specified noninflammatory disorders of vagina: Secondary | ICD-10-CM

## 2019-05-27 LAB — POCT URINALYSIS DIPSTICK
Bilirubin, UA: NEGATIVE
Blood, UA: NEGATIVE
Glucose, UA: NEGATIVE
Ketones, UA: NEGATIVE
Nitrite, UA: NEGATIVE
Protein, UA: NEGATIVE
Spec Grav, UA: <=1.005 — AB (ref 1.010–1.025)
Urobilinogen, UA: 0.2 E.U./dL
pH, UA: 6 (ref 5.0–8.0)

## 2019-05-27 NOTE — Telephone Encounter (Signed)
Thanks

## 2019-05-27 NOTE — Patient Instructions (Signed)
#  Vaginal symptoms - should have results tomorrow -

## 2019-05-27 NOTE — Progress Notes (Signed)
   Subjective:     Debra Little is a 63 y.o. female presenting for Vaginal Itching (discharge x 3 days. Burning with urination. Odor to the urine.)     HPI   #Vaginal discharge - green/yellow - small amount - some vaginal irritation, no skin changes - some burning with urination on the skin - similar to previous infections - no fever/chills - no odor to the discharge - some odor to the urine    Review of Systems  Constitutional: Negative for chills and fever.  Gastrointestinal: Negative for abdominal pain, nausea and vomiting.     Social History   Tobacco Use  Smoking Status Never Smoker  Smokeless Tobacco Never Used        Objective:    BP Readings from Last 3 Encounters:  05/27/19 104/68  05/12/19 130/70  01/09/19 124/68   Wt Readings from Last 3 Encounters:  05/27/19 147 lb 8 oz (66.9 kg)  05/12/19 148 lb 4 oz (67.2 kg)  01/09/19 147 lb 8 oz (66.9 kg)    BP 104/68   Pulse 69   Temp 98.2 F (36.8 C)   Ht 5\' 6"  (1.676 m)   Wt 147 lb 8 oz (66.9 kg)   SpO2 99%   BMI 23.81 kg/m    Physical Exam Constitutional:      General: She is not in acute distress.    Appearance: She is well-developed. She is not diaphoretic.  HENT:     Right Ear: External ear normal.     Left Ear: External ear normal.     Nose: Nose normal.  Eyes:     Conjunctiva/sclera: Conjunctivae normal.  Neck:     Musculoskeletal: Neck supple.  Cardiovascular:     Rate and Rhythm: Normal rate.  Pulmonary:     Effort: Pulmonary effort is normal.  Skin:    General: Skin is warm and dry.     Capillary Refill: Capillary refill takes less than 2 seconds.  Neurological:     Mental Status: She is alert. Mental status is at baseline.  Psychiatric:        Mood and Affect: Mood normal.        Behavior: Behavior normal.     UA: + LE but neg nitrites      Assessment & Plan:   Problem List Items Addressed This Visit    None    Visit Diagnoses    Vaginal discharge    -   Primary   Relevant Orders   WET PREP BY MOLECULAR PROBE   Burning with urination       Relevant Orders   POCT urinalysis dipstick (Completed)     Given mostly normal UA and last UA looked similar w/ negative culture and that pt more complaining of vaginal discharge will not send culture. Will wait for wet prep results.   Return if symptoms worsen or fail to improve.  Lesleigh Noe, MD

## 2019-05-28 ENCOUNTER — Telehealth: Payer: Self-pay | Admitting: Family Medicine

## 2019-05-28 DIAGNOSIS — R69 Illness, unspecified: Secondary | ICD-10-CM | POA: Diagnosis not present

## 2019-05-28 DIAGNOSIS — F411 Generalized anxiety disorder: Secondary | ICD-10-CM | POA: Diagnosis not present

## 2019-05-28 DIAGNOSIS — B3731 Acute candidiasis of vulva and vagina: Secondary | ICD-10-CM

## 2019-05-28 DIAGNOSIS — B373 Candidiasis of vulva and vagina: Secondary | ICD-10-CM

## 2019-05-28 LAB — WET PREP BY MOLECULAR PROBE
Candida species: DETECTED — AB
Gardnerella vaginalis: NOT DETECTED
MICRO NUMBER:: 621509
SPECIMEN QUALITY:: ADEQUATE
Trichomonas vaginosis: NOT DETECTED

## 2019-05-28 MED ORDER — FLUCONAZOLE 150 MG PO TABS: 150.0000 mg | ORAL_TABLET | Freq: Once | ORAL | 0 refills | Status: AC

## 2019-05-28 NOTE — Telephone Encounter (Signed)
Left message for patient to call back  

## 2019-05-28 NOTE — Telephone Encounter (Signed)
Please let patient know that her vaginal swab showed a yeast infection.   Fluconazole sent to the pharmacy -- this is a one time dose that should treat the infection.   Call back if symptoms persist

## 2019-05-28 NOTE — Telephone Encounter (Signed)
Patient advised.

## 2019-06-16 DIAGNOSIS — R69 Illness, unspecified: Secondary | ICD-10-CM | POA: Diagnosis not present

## 2019-06-16 DIAGNOSIS — F411 Generalized anxiety disorder: Secondary | ICD-10-CM | POA: Diagnosis not present

## 2019-06-25 DIAGNOSIS — F330 Major depressive disorder, recurrent, mild: Secondary | ICD-10-CM | POA: Diagnosis not present

## 2019-06-25 DIAGNOSIS — F410 Panic disorder [episodic paroxysmal anxiety] without agoraphobia: Secondary | ICD-10-CM | POA: Diagnosis not present

## 2019-06-25 DIAGNOSIS — R69 Illness, unspecified: Secondary | ICD-10-CM | POA: Diagnosis not present

## 2019-06-30 ENCOUNTER — Encounter: Payer: Self-pay | Admitting: Surgery

## 2019-06-30 ENCOUNTER — Ambulatory Visit: Payer: Medicare HMO | Admitting: Surgery

## 2019-06-30 ENCOUNTER — Other Ambulatory Visit: Payer: Self-pay

## 2019-06-30 VITALS — BP 125/71 | HR 83 | Temp 97.5°F | Resp 16 | Ht 66.0 in | Wt 146.8 lb

## 2019-06-30 DIAGNOSIS — N6001 Solitary cyst of right breast: Secondary | ICD-10-CM

## 2019-06-30 DIAGNOSIS — F411 Generalized anxiety disorder: Secondary | ICD-10-CM | POA: Diagnosis not present

## 2019-06-30 DIAGNOSIS — K219 Gastro-esophageal reflux disease without esophagitis: Secondary | ICD-10-CM | POA: Insufficient documentation

## 2019-06-30 DIAGNOSIS — R69 Illness, unspecified: Secondary | ICD-10-CM | POA: Diagnosis not present

## 2019-06-30 NOTE — Patient Instructions (Addendum)
  We will call you in January 2021 to schedule your screening mammogram and follow up breast exam.   Continue to do breast exams monthly.

## 2019-07-02 NOTE — Progress Notes (Signed)
Outpatient Surgical Follow Up  07/02/2019  Debra Little is an 63 y.o. female.   Chief Complaint  Patient presents with  . Follow-up    right breast pain    HPI: Debra Little is being follow-up after recent mammogram.  I have personally reviewed the mammogram showing no evidence of new concerning lesions.  Apparently she did have a history of an excisional biopsy of the breast by Dr. Tollie Pizza several years ago.  Last time she saw Dr. Tollie Pizza was about a year and a half ago.  She denies any breast tenderness there is no breast masses or nipple discharge.  She is also with anxious about potentially getting breast cancer.  Past Medical History:  Diagnosis Date  . Anxiety    sees Dr. [name]  . Cyst of breast    per [name]  . Fibromyalgia   . GERD (gastroesophageal reflux disease)   . Hemorrhoids   . Herpes, genital 04/2015   confirmed with HSV 2 IgG  . Hiatal hernia   . Hypercholesterolemia    Dr. Kyra Searles  . Hyperlipidemia   . IBS (irritable bowel syndrome)    constipation predominant  . IC (interstitial cystitis)    per [place] [facility]  . Mild depression ([place])   . MVP (mitral valve prolapse)    [facility] cards eval 2012  . Osteopenia    2010/2017, DEXA at [facility];spine and fem neck  . Vitamin D deficiency    history of    Past Surgical History:  Procedure Laterality Date  . ABDOMINAL HYSTERECTOMY    . BLADDER SURGERY     1980's  . BREAST EXCISIONAL BIOPSY Right    benign  . BREAST EXCISIONAL BIOPSY Bilateral    benign  . COLONOSCOPY  09/2014   Dr. [name]  . COLONOSCOPY  2009   at [facility] 1 POLYP (BENIGN)  . excision of breast cysts     hx of multiple cyst aspirations  . EXPLORATORY LAPAROTOMY    . TUBAL LIGATION      Family History  Problem Relation Age of Onset  . Breast cancer Mother 43  . Hypertension Mother   . Colon cancer Father 2  . Hyperlipidemia Father   . Ovarian cancer Maternal Aunt 80  . Breast cancer Cousin 49  . Gallbladder disease Paternal  Grandmother   . Liver cancer Cousin 70       Malignant    Social History:  reports that she has never smoked. She has never used smokeless tobacco. She reports that she does not drink alcohol or use drugs.  Allergies:  Allergies  Allergen Reactions  . Amitiza [Lubiprostone] Other (See Comments)    Overly effective at higher dose  . Bentyl [Dicyclomine Hcl]     Lack of effect  . Buspar [Buspirone] Other (See Comments)    Palpitations.   . Celecoxib     REACTION: unspecified  . Cortisone Other (See Comments)    flush  . Doxycycline     Palpitations and chest pain  . Mobic [Meloxicam]     Palpitations.  But can tolerate aleve  . Nitrofurantoin Monohyd Macro   . Penicillins     REACTION: rash  . Sulfonamide Derivatives     REACTION: nausea  . Zoloft [Sertraline Hcl]     nausea and diarrhea.       Medications reviewed.    ROS Full ROS performed and is otherwise negative other than what is stated in HPI   BP 125/71  Pulse 83   Temp (!) 97.5 F (36.4 C) (Temporal)   Resp 16   Ht 5\' 6"  (1.676 m)   Wt 146 lb 12.8 oz (66.6 kg)   SpO2 98%   BMI 23.69 kg/m   Physical Exam Nad, ALert Neck: no masses, no JVD, no LAD BREAST: no masses, nml skin and nipple. No LAD Abd: soft, nt, no peritonitis   Assessment/Plan: Normal breast exam with normal mammogram.  No need for any further biopsies or surgical intervention.  She wants to continue to follow-up with [place] in 1 year with physical exam and mammogram.   Greater than 50% of the 25 minutes  visit was spent in counseling/coordination of care   Caroleen Hamman, MD [facility]

## 2019-07-08 ENCOUNTER — Other Ambulatory Visit: Payer: Self-pay

## 2019-07-08 ENCOUNTER — Other Ambulatory Visit: Payer: Medicare HMO

## 2019-07-08 ENCOUNTER — Other Ambulatory Visit (INDEPENDENT_AMBULATORY_CARE_PROVIDER_SITE_OTHER): Payer: Medicare HMO

## 2019-07-08 ENCOUNTER — Other Ambulatory Visit: Payer: Self-pay | Admitting: Family Medicine

## 2019-07-08 ENCOUNTER — Ambulatory Visit: Payer: Medicare HMO

## 2019-07-08 DIAGNOSIS — E559 Vitamin D deficiency, unspecified: Secondary | ICD-10-CM

## 2019-07-08 DIAGNOSIS — E538 Deficiency of other specified B group vitamins: Secondary | ICD-10-CM

## 2019-07-08 DIAGNOSIS — F339 Major depressive disorder, recurrent, unspecified: Secondary | ICD-10-CM

## 2019-07-08 DIAGNOSIS — R5383 Other fatigue: Secondary | ICD-10-CM

## 2019-07-08 DIAGNOSIS — E785 Hyperlipidemia, unspecified: Secondary | ICD-10-CM

## 2019-07-08 DIAGNOSIS — R69 Illness, unspecified: Secondary | ICD-10-CM | POA: Diagnosis not present

## 2019-07-08 LAB — LIPID PANEL
Cholesterol: 146 mg/dL (ref 0–200)
HDL: 51.1 mg/dL (ref >39.00)
LDL Cholesterol: 80 mg/dL (ref 0–99)
NonHDL: 95.18
Total CHOL/HDL Ratio: 3
Triglycerides: 77 mg/dL (ref 0.0–149.0)
VLDL: 15.4 mg/dL (ref 0.0–40.0)

## 2019-07-08 LAB — VITAMIN D 25 HYDROXY (VIT D DEFICIENCY, FRACTURES): VITD: 98.92 ng/mL (ref 30.00–100.00)

## 2019-07-08 LAB — COMPREHENSIVE METABOLIC PANEL
ALT: 15 U/L (ref 0–35)
AST: 18 U/L (ref 0–37)
Albumin: 4.1 g/dL (ref 3.5–5.2)
Alkaline Phosphatase: 71 U/L (ref 39–117)
BUN: 15 mg/dL (ref 6–23)
CO2: 33 mEq/L — ABNORMAL HIGH (ref 19–32)
Calcium: 9.5 mg/dL (ref 8.4–10.5)
Chloride: 104 mEq/L (ref 96–112)
Creatinine, Ser: 0.85 mg/dL (ref 0.40–1.20)
GFR: 68.03 mL/min (ref >60.00)
Glucose, Bld: 98 mg/dL (ref 70–99)
Potassium: 4.1 mEq/L (ref 3.5–5.1)
Sodium: 142 mEq/L (ref 135–145)
Total Bilirubin: 0.5 mg/dL (ref 0.2–1.2)
Total Protein: 7 g/dL (ref 6.0–8.3)

## 2019-07-08 LAB — VITAMIN B12: Vitamin B-12: 1325 pg/mL — ABNORMAL HIGH (ref 211–911)

## 2019-07-08 LAB — TSH: TSH: 1.25 u[IU]/mL (ref 0.35–4.50)

## 2019-07-09 ENCOUNTER — Ambulatory Visit: Payer: Self-pay

## 2019-07-15 ENCOUNTER — Encounter: Payer: Self-pay | Admitting: Family Medicine

## 2019-07-16 ENCOUNTER — Other Ambulatory Visit: Payer: Self-pay

## 2019-07-16 ENCOUNTER — Ambulatory Visit: Payer: Medicare HMO | Admitting: Surgery

## 2019-07-16 ENCOUNTER — Encounter: Payer: Self-pay | Admitting: Surgery

## 2019-07-16 ENCOUNTER — Ambulatory Visit: Payer: Self-pay

## 2019-07-16 VITALS — BP 129/71 | HR 82 | Temp 97.7°F | Resp 16 | Ht 66.0 in | Wt 147.0 lb

## 2019-07-16 DIAGNOSIS — N6310 Unspecified lump in the right breast, unspecified quadrant: Secondary | ICD-10-CM

## 2019-07-16 NOTE — Patient Instructions (Addendum)
Mammogram and ultrasound scheduled at [facility] 07/25/2019 @1 ;40pm

## 2019-07-17 ENCOUNTER — Ambulatory Visit
Admission: RE | Admit: 2019-07-17 | Discharge: 2019-07-17 | Disposition: A | Discharge status: Home / Self Care | Payer: Medicare HMO | Source: Ambulatory Visit | Attending: Surgery | Admitting: Surgery

## 2019-07-17 DIAGNOSIS — N6310 Unspecified lump in the right breast, unspecified quadrant: Secondary | ICD-10-CM | POA: Diagnosis present

## 2019-07-17 DIAGNOSIS — N6001 Solitary cyst of right breast: Secondary | ICD-10-CM | POA: Insufficient documentation

## 2019-07-17 DIAGNOSIS — N6313 Unspecified lump in the right breast, lower outer quadrant: Secondary | ICD-10-CM | POA: Diagnosis not present

## 2019-07-17 DIAGNOSIS — R922 Inconclusive mammogram: Secondary | ICD-10-CM | POA: Diagnosis not present

## 2019-07-18 ENCOUNTER — Telehealth: Payer: Self-pay

## 2019-07-18 ENCOUNTER — Other Ambulatory Visit: Payer: Self-pay | Admitting: Surgery

## 2019-07-18 DIAGNOSIS — R928 Other abnormal and inconclusive findings on diagnostic imaging of breast: Secondary | ICD-10-CM

## 2019-07-18 NOTE — Telephone Encounter (Signed)
Patient called today about recommendations for a breast biopsy by radiology. Dr Dahlia Byes is agreement with this and I spoke with Tiffany Kocher at [facility]. She will place the order for this and contact the patient to get it scheduled.

## 2019-07-18 NOTE — Progress Notes (Signed)
Outpatient Surgical Follow Up  07/18/2019  Debra Little is an 63 y.o. female.   Chief Complaint  Patient presents with  . Follow-up    right breast mass    HPI: Debra Little is being follow-up    she did have a history of an excisional biopsy of the breast by [name] several years ago.  She now endorse a new mass on her right breast. No fevers, chills, no weight loss, no nipple dc.  She is also with anxious about potentially getting breast cancer.  Past Medical History:  Diagnosis Date  . Anxiety    sees Dr. [name]  . Cyst of breast    per [name]  . Fibromyalgia   . GERD (gastroesophageal reflux disease)   . Hemorrhoids   . Herpes, genital 04/2015   confirmed with HSV 2 IgG  . Hiatal hernia   . Hypercholesterolemia    Dr. Kyra Searles  . Hyperlipidemia   . IBS (irritable bowel syndrome)    constipation predominant  . IC (interstitial cystitis)    per [facility]  . Mild depression ([place])   . MVP (mitral valve prolapse)    [facility] cards eval 2012  . Osteopenia    2010/2017, DEXA at [facility];spine and fem neck  . Vitamin D deficiency    history of    Past Surgical History:  Procedure Laterality Date  . ABDOMINAL HYSTERECTOMY    . BLADDER SURGERY     1980's  . BREAST EXCISIONAL BIOPSY Right    benign  . BREAST EXCISIONAL BIOPSY Bilateral    benign  . COLONOSCOPY  09/2014   Dr. [name]  . COLONOSCOPY  2009   at [facility] 1 POLYP (BENIGN)  . excision of breast cysts     hx of multiple cyst aspirations  . EXPLORATORY LAPAROTOMY    . TUBAL LIGATION      Family History  Problem Relation Age of Onset  . Breast cancer Mother 70  . Hypertension Mother   . Colon cancer Father 16  . Hyperlipidemia Father   . Ovarian cancer Maternal Aunt 80  . Breast cancer Cousin 4  . Gallbladder disease Paternal Grandmother   . Liver cancer Cousin 70       Malignant    Social History:  reports that she has never smoked. She has never used smokeless tobacco. She reports  that she does not drink alcohol or use drugs.  Allergies:  Allergies  Allergen Reactions  . Amitiza [Lubiprostone] Other (See Comments)    Overly effective at higher dose  . Bentyl [Dicyclomine Hcl]     Lack of effect  . Buspar [Buspirone] Other (See Comments)    Palpitations.   . Celecoxib     REACTION: unspecified  . Cortisone Other (See Comments)    flush  . Doxycycline     Palpitations and chest pain  . Mobic [Meloxicam]     Palpitations.  But can tolerate aleve  . Nitrofurantoin Monohyd Macro   . Penicillins     REACTION: rash  . Sulfonamide Derivatives     REACTION: nausea  . Zoloft [Sertraline Hcl]     nausea and diarrhea.       Medications reviewed.    ROS Full ROS performed and is otherwise negative other than what is stated in HPI   BP 129/71   Pulse 82   Temp 97.7 F (36.5 C) (Temporal)   Resp 16   Ht 5\' 6"  (1.676 m)   Wt  147 lb (66.7 kg)   SpO2 98%   BMI 23.73 kg/m   Physical Exam Physical Exam Nad, ALert Neck: no masses, no JVD, no LAD BREAST:New breast mass Right breast located 9 o'clock, mobile, suspicious., nml skin and nipple. No LAD Abd: soft, nt, no peritonitis Ext: no edema and well perfused    Assessment/Plan: New breast mass Right in need for tissue bx. We will arrange this w [facility]. D/W the pt in detail about my thought process. We will see her after biopsy results are back.    Greater than 50% of the 25 minutes  visit was spent in counseling/coordination of care   Caroleen Hamman, MD [facility]

## 2019-07-23 DIAGNOSIS — R69 Illness, unspecified: Secondary | ICD-10-CM | POA: Diagnosis not present

## 2019-07-23 DIAGNOSIS — F411 Generalized anxiety disorder: Secondary | ICD-10-CM | POA: Diagnosis not present

## 2019-07-24 ENCOUNTER — Ambulatory Visit
Admission: RE | Admit: 2019-07-24 | Discharge: 2019-07-24 | Disposition: A | Discharge status: Home / Self Care | Payer: Medicare HMO | Source: Ambulatory Visit | Attending: Surgery | Admitting: Surgery

## 2019-07-24 ENCOUNTER — Other Ambulatory Visit: Payer: Medicare HMO

## 2019-07-24 DIAGNOSIS — R928 Other abnormal and inconclusive findings on diagnostic imaging of breast: Secondary | ICD-10-CM

## 2019-07-24 DIAGNOSIS — C50511 Malignant neoplasm of lower-outer quadrant of right female breast: Secondary | ICD-10-CM | POA: Diagnosis not present

## 2019-07-24 DIAGNOSIS — R590 Localized enlarged lymph nodes: Secondary | ICD-10-CM | POA: Diagnosis not present

## 2019-07-24 DIAGNOSIS — N6313 Unspecified lump in the right breast, lower outer quadrant: Secondary | ICD-10-CM | POA: Diagnosis not present

## 2019-07-25 ENCOUNTER — Other Ambulatory Visit: Payer: Self-pay | Admitting: Anatomic Pathology & Clinical Pathology

## 2019-07-25 ENCOUNTER — Telehealth: Payer: Self-pay | Admitting: Surgery

## 2019-07-25 ENCOUNTER — Other Ambulatory Visit: Payer: Medicare HMO

## 2019-07-28 ENCOUNTER — Other Ambulatory Visit: Payer: Self-pay

## 2019-07-28 ENCOUNTER — Ambulatory Visit: Payer: Medicare HMO | Admitting: Surgery

## 2019-07-28 ENCOUNTER — Encounter: Payer: Self-pay | Admitting: Surgery

## 2019-07-28 ENCOUNTER — Telehealth: Payer: Self-pay

## 2019-07-28 VITALS — BP 146/76 | HR 85 | Temp 98.1°F | Resp 16 | Ht 66.0 in | Wt 147.6 lb

## 2019-07-28 DIAGNOSIS — C50511 Malignant neoplasm of lower-outer quadrant of right female breast: Secondary | ICD-10-CM

## 2019-07-28 NOTE — Telephone Encounter (Signed)
Patient states she is on Progesterone and she has breast cancer & they want her to come off of it. She is inquiring if she needs to come down before stopping or if she can just stop taking it. [phone]

## 2019-07-28 NOTE — Patient Instructions (Addendum)
We have sent Urgent referrals to Dr.Wakefield and Dr.Rao. Someone from their office will call you within a few days to schedule an appointment. If you do not hear from anyone please call our office and let us know.       Total or Modified Radical Mastectomy, Care After This sheet gives you information about how to care for yourself after your procedure. Your health care provider may also give you more specific instructions. If you have problems or questions, contact your health care provider. What can I expect after the procedure? After the procedure, it is common to have:  Pain.  Numbness.  Stiffness in the arm or shoulder.  Feelings of stress, sadness, or depression. If the lymph nodes under your arm were removed, you may have arm swelling, weakness, or numbness on the same side of your body as your surgery. Follow these instructions at home: Incision care   Follow instructions from your health care provider about how to take care of your incision. Make sure you: ? Wash your hands with soap and water before you change your bandage (dressing). If soap and water are not available, use hand sanitizer. ? Change your dressing as told by your health care provider. ? Leave stitches (sutures), skin glue, or adhesive strips in place. These skin closures may need to stay in place for 2 weeks or longer. If adhesive strip edges start to loosen and curl up, you may trim the loose edges. Do not remove adhesive strips completely unless your health care provider tells you to do that.  Check your incision area every day for signs of infection. Check for: ? Redness, swelling, or more pain. ? Fluid or blood. ? Warmth. ? Pus or a bad smell.  If you were sent home with a surgical drain in place, follow instructions from your health care provider about emptying it. Bathing  Do not take baths, swim, or use a hot tub until your health care provider approves. Ask your health care provider if you may  take showers. You may only be allowed to take sponge baths. Activity  Return to your normal activities as told by your health care provider. Ask your health care provider what activities are safe for you.  Avoid activities that take a lot of effort.  Be careful to avoid any activities that could cause an injury to your arm on the side of your surgery.  Do not lift anything that is heavier than 10 lb (4.5 kg), or the limit that you are told, until your health care provider says that it is safe.  Avoid lifting with the arm on the side of your surgery.  Do not carry heavy objects on your shoulder.  After your drain is removed, do exercises to prevent stiffness and swelling in your arm. Talk with your health care provider about which exercises are safe for you. General instructions  Take over-the-counter and prescription medicines only as told by your health care provider.  You may eat what you usually do.  Keep your arm raised (elevated) above the level of your heart when you are sitting or lying down.  Do not wear tight jewelry on your arm, wrist, or fingers on the side of your surgery.  You may be given a tight sleeve (compression bandage) to wear over your arm on the side of your surgery. Wear this sleeve as told by your health care provider.  Ask your health care provider when you can start wearing a bra or using  a breast prosthesis.  Before you are involved in certain procedures such as giving blood or having your blood pressure checked, tell all your health care providers if lymph nodes under your arm were removed. This is important information. Follow-up  Keep all follow-up visits as told by your health care provider. This is important.  Get checked for extra fluid around your lymph nodes (lymphedema) as often as told by your health care provider. Contact a health care provider if:  You have a fever.  Your pain medicine is not working.  Your arm swelling, weakness, or  numbness has not improved after a few weeks.  You have new swelling in your breast area or arm.  You have redness, swelling, or more pain in your incision area.  You have fluid or blood coming from your incision.  Your incision feels warm to the touch.  You have pus or a bad smell coming from your incision. Get help right away if:  You have very bad pain in your breast area or arm.  You have chest pain.  You have difficulty breathing. Summary  Follow instructions from your health care provider about how to take care of your incision. Check your incision area every day for signs of infection.  Ask your health care provider what activities are safe for you.  Keep all follow-up visits as told by your health care provider. This is important.  Make sure you know which symptoms should cause you to contact your health care provider or to get help right away. This information is not intended to replace advice given to you by your health care provider. Make sure you discuss any questions you have with your health care provider. Document Released: 07/06/2004 Document Revised: 01/17/2019 Document Reviewed: 08/17/2017 Elsevier Patient Education  [place] After This sheet gives you information about how to care for yourself after your procedure. Your health care provider may also give you more specific instructions. If you have problems or questions, contact your health care provider. What can I expect after the procedure? After the procedure, it is common to have:  Breast swelling.  Breast tenderness.  Stiffness in your arm or shoulder.  A change in the shape and feel of your breast.  Scar tissue that feels hard to the touch in the area where the lump was removed. Follow these instructions at home: Medicines  Take over-the-counter and prescription medicines only as told by your health care provider.  Take your antibiotic medicine as told by your health  care provider. Do not stop taking the antibiotic even if you start to feel better.  If you are taking prescription pain medicine, take actions to prevent or treat constipation. Your health care provider may recommend that you: ? Drink enough fluid to keep your urine pale yellow. ? Eat foods that are high in fiber, such as fresh fruits and vegetables, whole grains, and beans. ? Limit foods that are high in fat and processed sugars, such as fried and sweet foods. ? Take an over-the-counter or prescription medicine for constipation. Bathing  Do not take baths, swim, or use a hot tub until your health care provider approves. Ask your health care provider if you may take showers. You may only be allowed to take sponge baths. Incision care      Follow instructions from your health care provider about how to take care of your incision. Make sure you: ? Wash your hands with soap and water before you change  your bandage (dressing). If soap and water are not available, use hand sanitizer. ? Change your dressing as told by your health care provider. ? Leave stitches (sutures), skin glue, or adhesive strips in place. These skin closures may need to stay in place for 2 weeks or longer. If adhesive strip edges start to loosen and curl up, you may trim the loose edges. Do not remove adhesive strips completely unless your health care provider tells you to do that.  Check your incision area every day for signs of infection. Check for: ? Redness, swelling, or pain. ? Fluid or blood. ? Warmth. ? Pus or a bad smell.  Keep your dressing clean and dry.  If you were sent home with a surgical drain in place, follow instructions from your health care provider about emptying it. Activity  Return to your normal activities as told by your health care provider. Ask your health care provider what activities are safe for you.  Be careful to avoid any activities that could cause an injury to your arm on the side of  your surgery.  Do not lift anything that is heavier than 10 lb (4.5 kg), or the limit that you are told, until your health care provider says that it is safe. Avoid lifting with the arm that is on the side of your surgery.  Do not carry heavy objects on your shoulder on the side of your surgery.  After your drain is removed, you should perform exercises to keep your arm from getting stiff and swollen. Talk with your health care provider about which exercises are safe for you. General instructions  Do not drive or use heavy machinery while taking prescription pain medicine.  Wear a supportive bra as told by your health care provider.  Raise (elevate) your arm above the level of your heart while you are sitting or lying down.  Do not wear tight jewelry on your arm, wrist, or fingers on the side of your surgery.  Keep all follow-up visits as told by your health care provider. This is important. ? You may need to be screened for extra fluid around the lymph nodes (lymphedema). Follow instructions from your health care provider about how often you should be checked.  If you had any lymph nodes removed during your procedure, be sure to tell all of your health care providers. This is important information to share before you are involved in certain procedures, such as having blood tests or having your blood pressure taken. Contact a health care provider if:  You develop a rash.  You have a fever.  Your pain medicine is not working.  Your swelling, weakness, or numbness in your arm has not improved after a few weeks.  You have new swelling in your breast or arm.  You have redness, swelling, or pain in your incision area.  You have fluid or blood coming from your incision.  Your incision feels warm to the touch.  You have pus or a bad smell coming from your incision. Get help right away if:  You have very bad pain in your breast or arm.  You have chest pain.  You have difficulty  breathing. Summary  After the procedure, it is common to have breast tenderness, swelling, and stiffness in your arm and shoulder.  Follow instructions from your health care provider about how to take care of your incision.  Do not lift anything that is heavier than 10 lb (4.5 kg), or the limit that you  are told, until your health care provider says that it is safe. Avoid lifting with the arm that is on the side of your surgery.  If you had any lymph nodes removed during your procedure, be sure to tell all of your health care providers. This is important information to share before you are involved in certain procedures, such as having blood tests or having your blood pressure taken. This information is not intended to replace advice given to you by your health care provider. Make sure you discuss any questions you have with your health care provider. Document Released: 11/29/2006 Document Revised: 02/21/2019 Document Reviewed: 07/26/2016 Elsevier Patient Education  2020 [employer].

## 2019-07-29 ENCOUNTER — Telehealth: Payer: Self-pay

## 2019-07-29 ENCOUNTER — Encounter: Payer: Self-pay | Admitting: Surgery

## 2019-07-29 ENCOUNTER — Other Ambulatory Visit: Payer: Self-pay | Admitting: Oncology

## 2019-07-29 DIAGNOSIS — C50511 Malignant neoplasm of lower-outer quadrant of right female breast: Secondary | ICD-10-CM

## 2019-07-29 MED ORDER — ALPRAZOLAM 0.5 MG PO TABS: 0.5000 mg | ORAL_TABLET | ORAL | 0 refills | Status: DC | PRN

## 2019-07-29 NOTE — Telephone Encounter (Signed)
Pt diagnosed with CIS, waiting for hormone results. Pt to d/c progesterone "cold [place]". May have vasomotor sx but needs to be off HRT.

## 2019-07-29 NOTE — Telephone Encounter (Signed)
Referral information, snapshot, insurance and office notes faxed to Dr.Matthew Donne Hazel at [facility] at  this time. [phone].

## 2019-07-29 NOTE — Telephone Encounter (Signed)
Patient called and states that she would like something for anxiety for her MRI on 07/31/19. She says that the Klonopin is not working an would like something for that day. Per Dr Dahlia Byes we will prescribe her Xanax 0.5 mg po PRN #2 with no refills.  Per MRI radiology at [facility] they ask that the patient have a driver.  Patient notified and will pick up her prescription.

## 2019-07-29 NOTE — Progress Notes (Addendum)
Outpatient Surgical Follow Up  07/29/2019  Debra Little is an 63 y.o. female.   Chief Complaint  Patient presents with  . Follow-up    Mammogram /US    HPI: 63 year old well-known known to me with a prior history of bilateral excisional biopsies and right-sided breast pain.  I saw her first last month and at that time I did not feel any masses on physical exam and her mammogram from February 19 was normal.  Weeks ago she came in with a palpable mass that was detectable on physical exam therefore I will order another diagnostic ultrasound and mammogram that I have personally reviewed.  This showed a new 2 cm mass 7:30 o'Clock 5 cms from nipple  w abnormal lymph node on the Right  . Underwent image guided biopsies showing evidence of invasive mammary carcinoma on both the breast mass and lymph nodes. ER/PR Her 2 negative SHe does report significant enlargement of breast  Mass over the last couple of weeks. Of note she did have progesterone prescribed by her gynecologist for hot flashes.  She does have family history significant for breast cancer on her mother at age 41 and an aunt with ovarian cancer at age 71.  Her menarche was at age 18 she had one pregnancy she did not breast-feed.   Past Medical History:  Diagnosis Date  . Anxiety    sees Dr. [name]  . Breast cancer ([place]) 2020  . Cyst of breast    per [name]  . Fibromyalgia   . GERD (gastroesophageal reflux disease)   . Hemorrhoids   . Herpes, genital 04/2015   confirmed with HSV 2 IgG  . Hiatal hernia   . Hypercholesterolemia    Dr. Kyra Searles  . Hyperlipidemia   . IBS (irritable bowel syndrome)    constipation predominant  . IC (interstitial cystitis)    per [facility]  . Mild depression ([place])   . MVP (mitral valve prolapse)    [facility] cards eval 2012  . Osteopenia    2010/2017, DEXA at [facility];spine and fem neck  . Vitamin D deficiency    history of    Past Surgical History:  Procedure Laterality Date  .  ABDOMINAL HYSTERECTOMY    . BLADDER SURGERY     1980's  . BREAST EXCISIONAL BIOPSY Right    benign  . BREAST EXCISIONAL BIOPSY Bilateral    benign  . COLONOSCOPY  09/2014   Dr. [name]  . COLONOSCOPY  2009   at [facility] 1 POLYP (BENIGN)  . excision of breast cysts     hx of multiple cyst aspirations  . EXPLORATORY LAPAROTOMY    . TUBAL LIGATION      Family History  Problem Relation Age of Onset  . Breast cancer Mother 31  . Hypertension Mother   . Colon cancer Father 70  . Hyperlipidemia Father   . Ovarian cancer Maternal Aunt 80  . Breast cancer Cousin 43  . Gallbladder disease Paternal Grandmother   . Liver cancer Cousin 70       Malignant    Social History:  reports that she has never smoked. She has never used smokeless tobacco. She reports that she does not drink alcohol or use drugs.  Allergies:  Allergies  Allergen Reactions  . Amitiza [Lubiprostone] Other (See Comments)    Overly effective at higher dose  . Bentyl [Dicyclomine Hcl]     Lack of effect  . Buspar [Buspirone] Other (See Comments)    Palpitations.   [name]  Celecoxib     REACTION: unspecified  . Cortisone Other (See Comments)    flush  . Doxycycline     Palpitations and chest pain  . Mobic [Meloxicam]     Palpitations.  But can tolerate aleve  . Nitrofurantoin Monohyd Macro   . Penicillins     REACTION: rash  . Sulfonamide Derivatives     REACTION: nausea  . Zoloft [Sertraline Hcl]     nausea and diarrhea.       Medications reviewed.    ROS Full ROS performed and is otherwise negative other than what is stated in HPI   BP (!) 146/76   Pulse 85   Temp 98.1 F (36.7 C) (Temporal)   Resp 16   Ht '5\' 6"'$  (1.676 m)   Wt 147 lb 9.6 oz (67 kg)   SpO2 99%   BMI 23.82 kg/m   Physical Exam Vitals signs and nursing note reviewed. Exam conducted with a chaperone present.  Constitutional:      Appearance: Normal appearance. She is normal weight.  Eyes:     General: No scleral icterus.        Right eye: No discharge.        Left eye: No discharge.  Neck:     Musculoskeletal: Normal range of motion and neck supple. No neck rigidity or muscular tenderness.  Cardiovascular:     Rate and Rhythm: Normal rate and regular rhythm.     Heart sounds: No murmur.  Pulmonary:     Effort: Pulmonary effort is normal. No respiratory distress.     Breath sounds: No stridor.     Comments: BREAST: There is now a significant increase in size on the MASS located around 8:30 OCLOCK Right BREAST, now measures approximately 6 cm this is at least 2-1/2 times larger than when I examined her 2 weeks ago.  There is some edema likely from the biopsy changes and some resolving ecchymosis. Difficult to assess whether or not not the increase in size is related to a hematoma from the biopsy Abdominal:     General: There is no distension.     Palpations: Abdomen is soft.     Tenderness: There is no abdominal tenderness. There is no guarding.  Musculoskeletal: Normal range of motion.  Skin:    General: Skin is warm and dry.     Capillary Refill: Capillary refill takes less than 2 seconds.     Coloration: Skin is not jaundiced.  Neurological:     General: No focal deficit present.     Mental Status: She is alert and oriented to person, place, and time.  Psychiatric:        Mood and Affect: Mood normal.        Behavior: Behavior normal.        Thought Content: Thought content normal.        Judgment: Judgment normal.       Assessment/Plan: 63 year old with newly diagnosed right breast cancer rapidly growing.  Evidence of metastatic disease in the lymph node however there is no bulky adenopathy on physical exam.  Given that she is on progesterone I advised on stopping this. I Had an extensive discussion with the patient regarding the diagnosis of breast cancer.  Multimodal therapy to include chemotherapy, potential radiation and surgery.  At this point given the size of the mass as well as the extension  onto the axillary nodes my recommendation is to do neoadjuvant chemotherapy first.  We will have to  wait for the ER PR and HER-2/neu status.  I have also made an urgent referral to medical oncology.  From surgical perspective she is very clear and she states that she wishes to have a mastectomy.  Now that raises the question for genetic counseling that we will start to arrange and the potential for breast MRI depending on what her final thoughts on surgical therapy would be. Also offered her a second opinion and she wishes to see [name] in [place]. I provided extensive counseling and significant supportive psychotherapy. Both the boyfriend and the patient were very appreciative  Greater than 50% of the 73mnutes  visit was spent in counseling/coordination of care  I had a conversation with Dr. RJanese Banks she wishes to start chemo ASAP. D/W the pt over the phone about the situation and we will schedule for port placement next Tuesday. She understand that we will test her for covid. Procedure d/w the pt in detail. She understands and agrees.  DCaroleen Hamman MD FSouthwest Regional Medical CenterGeneral Surgeon

## 2019-07-29 NOTE — Addendum Note (Signed)
Addended by: Caroleen Hamman F on: 07/29/2019 11:21 AM   Modules accepted: Orders, SmartSet

## 2019-07-29 NOTE — H&P (View-Only) (Signed)
Outpatient Surgical Follow Up  07/29/2019  Debra Little is an 63 y.o. female.   Chief Complaint  Patient presents with  . Follow-up    Mammogram /US    HPI: 63 year old well-known known to me with a prior history of bilateral excisional biopsies and right-sided breast pain.  I saw her first last month and at that time I did not feel any masses on physical exam and her mammogram from February 19 was normal.  Weeks ago she came in with a palpable mass that was detectable on physical exam therefore I will order another diagnostic ultrasound and mammogram that I have personally reviewed.  This showed a new 2 cm mass 7:30 o'Clock 5 cms from nipple  w abnormal lymph node on the Right  . Underwent image guided biopsies showing evidence of invasive mammary carcinoma on both the breast mass and lymph nodes. ER/PR Her 2 negative SHe does report significant enlargement of breast  Mass over the last couple of weeks. Of note she did have progesterone prescribed by her gynecologist for hot flashes.  She does have family history significant for breast cancer on her mother at age 50 and an aunt with ovarian cancer at age 29.  Her menarche was at age 69 she had one pregnancy she did not breast-feed.   Past Medical History:  Diagnosis Date  . Anxiety    sees Dr. [name]  . Breast cancer ([place]) 2020  . Cyst of breast    per [name]  . Fibromyalgia   . GERD (gastroesophageal reflux disease)   . Hemorrhoids   . Herpes, genital 04/2015   confirmed with HSV 2 IgG  . Hiatal hernia   . Hypercholesterolemia    Dr. Kyra Searles  . Hyperlipidemia   . IBS (irritable bowel syndrome)    constipation predominant  . IC (interstitial cystitis)    per [facility]  . Mild depression ([place])   . MVP (mitral valve prolapse)    [facility] cards eval 2012  . Osteopenia    2010/2017, DEXA at [facility];spine and fem neck  . Vitamin D deficiency    history of    Past Surgical History:  Procedure Laterality Date  .  ABDOMINAL HYSTERECTOMY    . BLADDER SURGERY     1980's  . BREAST EXCISIONAL BIOPSY Right    benign  . BREAST EXCISIONAL BIOPSY Bilateral    benign  . COLONOSCOPY  09/2014   Dr. [name]  . COLONOSCOPY  2009   at [facility] 1 POLYP (BENIGN)  . excision of breast cysts     hx of multiple cyst aspirations  . EXPLORATORY LAPAROTOMY    . TUBAL LIGATION      Family History  Problem Relation Age of Onset  . Breast cancer Mother 90  . Hypertension Mother   . Colon cancer Father 10  . Hyperlipidemia Father   . Ovarian cancer Maternal Aunt 80  . Breast cancer Cousin 31  . Gallbladder disease Paternal Grandmother   . Liver cancer Cousin 70       Malignant    Social History:  reports that she has never smoked. She has never used smokeless tobacco. She reports that she does not drink alcohol or use drugs.  Allergies:  Allergies  Allergen Reactions  . Amitiza [Lubiprostone] Other (See Comments)    Overly effective at higher dose  . Bentyl [Dicyclomine Hcl]     Lack of effect  . Buspar [Buspirone] Other (See Comments)    Palpitations.   [name]  Celecoxib     REACTION: unspecified  . Cortisone Other (See Comments)    flush  . Doxycycline     Palpitations and chest pain  . Mobic [Meloxicam]     Palpitations.  But can tolerate aleve  . Nitrofurantoin Monohyd Macro   . Penicillins     REACTION: rash  . Sulfonamide Derivatives     REACTION: nausea  . Zoloft [Sertraline Hcl]     nausea and diarrhea.       Medications reviewed.    ROS Full ROS performed and is otherwise negative other than what is stated in HPI   BP (!) 146/76   Pulse 85   Temp 98.1 F (36.7 C) (Temporal)   Resp 16   Ht '5\' 6"'$  (1.676 m)   Wt 147 lb 9.6 oz (67 kg)   SpO2 99%   BMI 23.82 kg/m   Physical Exam Vitals signs and nursing note reviewed. Exam conducted with a chaperone present.  Constitutional:      Appearance: Normal appearance. She is normal weight.  Eyes:     General: No scleral icterus.        Right eye: No discharge.        Left eye: No discharge.  Neck:     Musculoskeletal: Normal range of motion and neck supple. No neck rigidity or muscular tenderness.  Cardiovascular:     Rate and Rhythm: Normal rate and regular rhythm.     Heart sounds: No murmur.  Pulmonary:     Effort: Pulmonary effort is normal. No respiratory distress.     Breath sounds: No stridor.     Comments: BREAST: There is now a significant increase in size on the MASS located around 8:30 OCLOCK Right BREAST, now measures approximately 6 cm this is at least 2-1/2 times larger than when I examined her 2 weeks ago.  There is some edema likely from the biopsy changes and some resolving ecchymosis. Difficult to assess whether or not not the increase in size is related to a hematoma from the biopsy Abdominal:     General: There is no distension.     Palpations: Abdomen is soft.     Tenderness: There is no abdominal tenderness. There is no guarding.  Musculoskeletal: Normal range of motion.  Skin:    General: Skin is warm and dry.     Capillary Refill: Capillary refill takes less than 2 seconds.     Coloration: Skin is not jaundiced.  Neurological:     General: No focal deficit present.     Mental Status: She is alert and oriented to person, place, and time.  Psychiatric:        Mood and Affect: Mood normal.        Behavior: Behavior normal.        Thought Content: Thought content normal.        Judgment: Judgment normal.       Assessment/Plan: 63 year old with newly diagnosed right breast cancer rapidly growing.  Evidence of metastatic disease in the lymph node however there is no bulky adenopathy on physical exam.  Given that she is on progesterone I advised on stopping this. I Had an extensive discussion with the patient regarding the diagnosis of breast cancer.  Multimodal therapy to include chemotherapy, potential radiation and surgery.  At this point given the size of the mass as well as the extension  onto the axillary nodes my recommendation is to do neoadjuvant chemotherapy first.  We will have to  wait for the ER PR and HER-2/neu status.  I have also made an urgent referral to medical oncology.  From surgical perspective she is very clear and she states that she wishes to have a mastectomy.  Now that raises the question for genetic counseling that we will start to arrange and the potential for breast MRI depending on what her final thoughts on surgical therapy would be. Also offered her a second opinion and she wishes to see [name] in [place]. I provided extensive counseling and significant supportive psychotherapy. Both the boyfriend and the patient were very appreciative  Greater than 50% of the 18mnutes  visit was spent in counseling/coordination of care  I had a conversation with Dr. RJanese Banks she wishes to start chemo ASAP. D/W the pt over the phone about the situation and we will schedule for port placement next Tuesday. She understand that we will test her for covid. Procedure d/w the pt in detail. She understands and agrees.  DCaroleen Hamman MD FArkansas Specialty Surgery CenterGeneral Surgeon

## 2019-07-30 ENCOUNTER — Telehealth: Payer: Self-pay | Admitting: Surgery

## 2019-07-30 ENCOUNTER — Encounter: Payer: Self-pay | Admitting: Family Medicine

## 2019-07-30 ENCOUNTER — Telehealth: Payer: Self-pay

## 2019-07-30 DIAGNOSIS — C50919 Malignant neoplasm of unspecified site of unspecified female breast: Secondary | ICD-10-CM | POA: Insufficient documentation

## 2019-07-30 NOTE — Telephone Encounter (Signed)
Patient notified of Dr.Wakefield appointment 08/08/2019 @ 10:00 am.

## 2019-07-30 NOTE — Telephone Encounter (Signed)
Pt advised of pre op date/time and sx date. Sx: 08/05/19 with Dr Jeremy Johann Placement.  Pre op: 07/31/19-telephone interview between 1-5:00pm Covid testing-08/01/19 [facility] between 8-10:30am.  Patient made aware to call [phone], between 1-3:00pm the day before surgery, to find out what time to arrive.    Patient understands all information.

## 2019-07-31 ENCOUNTER — Other Ambulatory Visit: Payer: Self-pay

## 2019-07-31 ENCOUNTER — Ambulatory Visit
Admission: RE | Admit: 2019-07-31 | Discharge: 2019-07-31 | Disposition: A | Discharge status: Home / Self Care | Payer: Medicare HMO | Source: Ambulatory Visit | Attending: Oncology | Admitting: Oncology

## 2019-07-31 ENCOUNTER — Other Ambulatory Visit: Payer: Self-pay | Admitting: *Deleted

## 2019-07-31 ENCOUNTER — Encounter: Payer: Self-pay | Admitting: *Deleted

## 2019-07-31 ENCOUNTER — Inpatient Hospital Stay: Payer: Medicare HMO | Attending: Oncology | Admitting: Oncology

## 2019-07-31 ENCOUNTER — Inpatient Hospital Stay: Admission: RE | Admit: 2019-07-31 | Payer: Medicare HMO | Source: Ambulatory Visit

## 2019-07-31 ENCOUNTER — Encounter: Payer: Self-pay | Admitting: Oncology

## 2019-07-31 ENCOUNTER — Encounter: Payer: Medicare HMO | Admitting: Family Medicine

## 2019-07-31 VITALS — BP 135/74 | HR 67 | Temp 98.5°F | Resp 20

## 2019-07-31 DIAGNOSIS — C50511 Malignant neoplasm of lower-outer quadrant of right female breast: Secondary | ICD-10-CM | POA: Insufficient documentation

## 2019-07-31 DIAGNOSIS — Z8041 Family history of malignant neoplasm of ovary: Secondary | ICD-10-CM | POA: Diagnosis not present

## 2019-07-31 DIAGNOSIS — Z800 Family history of malignant neoplasm of digestive organs: Secondary | ICD-10-CM | POA: Insufficient documentation

## 2019-07-31 DIAGNOSIS — Z171 Estrogen receptor negative status [ER-]: Secondary | ICD-10-CM | POA: Insufficient documentation

## 2019-07-31 DIAGNOSIS — M8580 Other specified disorders of bone density and structure, unspecified site: Secondary | ICD-10-CM | POA: Insufficient documentation

## 2019-07-31 DIAGNOSIS — N6489 Other specified disorders of breast: Secondary | ICD-10-CM | POA: Diagnosis not present

## 2019-07-31 DIAGNOSIS — K219 Gastro-esophageal reflux disease without esophagitis: Secondary | ICD-10-CM | POA: Diagnosis not present

## 2019-07-31 DIAGNOSIS — Z5111 Encounter for antineoplastic chemotherapy: Secondary | ICD-10-CM | POA: Diagnosis present

## 2019-07-31 DIAGNOSIS — R5381 Other malaise: Secondary | ICD-10-CM

## 2019-07-31 DIAGNOSIS — E785 Hyperlipidemia, unspecified: Secondary | ICD-10-CM | POA: Insufficient documentation

## 2019-07-31 DIAGNOSIS — Z853 Personal history of malignant neoplasm of breast: Secondary | ICD-10-CM | POA: Diagnosis not present

## 2019-07-31 DIAGNOSIS — Z7689 Persons encountering health services in other specified circumstances: Secondary | ICD-10-CM | POA: Diagnosis not present

## 2019-07-31 DIAGNOSIS — F419 Anxiety disorder, unspecified: Secondary | ICD-10-CM | POA: Diagnosis not present

## 2019-07-31 DIAGNOSIS — R5383 Other fatigue: Secondary | ICD-10-CM

## 2019-07-31 DIAGNOSIS — Z79899 Other long term (current) drug therapy: Secondary | ICD-10-CM | POA: Diagnosis not present

## 2019-07-31 DIAGNOSIS — M797 Fibromyalgia: Secondary | ICD-10-CM | POA: Insufficient documentation

## 2019-07-31 DIAGNOSIS — R69 Illness, unspecified: Secondary | ICD-10-CM | POA: Diagnosis not present

## 2019-07-31 MED ORDER — GADOBUTROL 1 MMOL/ML IV SOLN
6.0000 mL | Freq: Once | INTRAVENOUS | Status: AC | PRN
  Administered 2019-07-31: 6 mL via INTRAVENOUS

## 2019-07-31 NOTE — Progress Notes (Signed)
Met patient today during her initial medical oncology consult with Dr. Janese Banks.  Breast cancer molecular studies are not complete.  Dr. Janese Banks did discuss neoadjuvant chemotherapy with the patient and her boyfriend.  Patient is scheduled for a breast MRI and PET scan.  She will follow up with Dr. Janese Banks next week for final treatment plan.  She is to call with any questions or needs.

## 2019-07-31 NOTE — Addendum Note (Signed)
Addended by: Randa Evens C on: 07/31/2019 11:29 AM   Modules accepted: Level of Service

## 2019-07-31 NOTE — Progress Notes (Signed)
Hematology/Oncology Consult note [facility] [phone] [phone]  Patient Care Team: Joaquim Nam, MD as PCP - General (Family Medicine) Lemar Livings, Merrily Pew, MD (General Surgery) Arta Silence, MD (Family Medicine) Hulan Fray as Physician Assistant (Urology) Dalia Heading, MD as Consulting Physician (Cardiology)   Name of the patient: Debra Little  [id number]  [birth year]    Reason for referral-new diagnosis of breast cancer   Referring physician-Dr. Everlene Farrier  Date of visit: 07/31/19   History of presenting illness- Patient is a 63 year old female with seen Dr. Doristine Counter in the past and has undergone breast biopsies which did not previously showed malignancy.  More recently patient noted some red discoloration around her right perioral as well as a palpable mass which led to a diagnostic mammogram on the right side her prior mammogram in February 2020 was normal in August 2020 she was noted to have a 2.7 x 2 x 2.3 cm mass in her right breast along with 4 morphologically abnormal lymph nodes.  She underwent breast biopsy as well as lymph node biopsy which showed grade 3 invasive mammary carcinoma.  ER PR and HER-2/neu status is currently pending.  Lymphovascular invasion present.  There was extranodal extension present on the lymph node biopsy.   Patient has seen Dr. Everlene Farrier and will be undergoing port placement next week.  Her family history is significant for breast cancer in her mother in her 61s.  Father had colon cancer in his 27s.  Family history also significant for ovarian cancer in her maternal aunt.  Currently patient notes some discomfort in her right breast but denies other complaints.  Patient has a history of mitral valve prolapse and heart murmur and also follows up with Dr. Lady Gary.  Her other medical problems include depression, fibromyalgia  ECOG PS- 1  Pain scale- 0   Review of systems- Review of Systems    Constitutional: Positive for malaise/fatigue. Negative for chills, fever and weight loss.  HENT: Negative for congestion, ear discharge and nosebleeds.   Eyes: Negative for blurred vision.  Respiratory: Negative for cough, hemoptysis, sputum production, shortness of breath and wheezing.   Cardiovascular: Negative for chest pain, palpitations, orthopnea and claudication.  Gastrointestinal: Negative for abdominal pain, blood in stool, constipation, diarrhea, heartburn, melena, nausea and vomiting.  Genitourinary: Negative for dysuria, flank pain, frequency, hematuria and urgency.  Musculoskeletal: Negative for back pain, joint pain and myalgias.  Skin: Negative for rash.  Neurological: Negative for dizziness, tingling, focal weakness, seizures, weakness and headaches.  Endo/Heme/Allergies: Does not bruise/bleed easily.  Psychiatric/Behavioral: Negative for depression and suicidal ideas. The patient does not have insomnia.   Right breast soreness.  Allergies  Allergen Reactions  . Amitiza [Lubiprostone] Other (See Comments)    Overly effective at higher dose  . Bentyl [Dicyclomine Hcl]     Lack of effect  . Buspar [Buspirone] Other (See Comments)    Palpitations.   . Celecoxib     REACTION: unspecified  . Cortisone Other (See Comments)    flush  . Doxycycline     Palpitations and chest pain  . Mobic [Meloxicam]     Palpitations.  But can tolerate aleve  . Nitrofurantoin Monohyd Macro   . Penicillins     REACTION: rash  . Sulfonamide Derivatives     REACTION: nausea  . Zoloft [Sertraline Hcl]     nausea and diarrhea.       Patient Active Problem List   Diagnosis Date Noted  .  Breast cancer (HCC) 07/30/2019  . GERD (gastroesophageal reflux disease) 06/30/2019  . Abdominal discomfort 05/15/2019  . Advance care planning 07/14/2018  . Health care maintenance 07/14/2018  . Depression, recurrent (HCC) 07/14/2018  . Constipation 07/14/2018  . Altered taste 07/14/2018  .  Fibrocystic breast changes of both breasts 01/24/2018  . Vasomotor symptoms due to menopause 09/05/2017  . Herpes simplex vulvovaginitis 09/05/2017  . Skin nodule 05/25/2017  . Encounter for screening examination for infectious disease 03/18/2017  . Lipoma 03/18/2017  . Vaginitis 03/18/2017  . Radicular pain in left arm 07/27/2015  . Neuralgia and neuritis, unspecified 07/27/2015  . Fatigue 03/05/2015  . B12 deficiency 03/05/2015  . Skin rash 05/01/2014  . MVP (mitral valve prolapse) 04/30/2014  . Cyst of breast 04/05/2014  . Interstitial cystitis 03/31/2014  . Routine general medical examination at a health care facility 11/13/2011  . Anxiety and depression 10/06/2010  . BREAST CYSTS, BILATERAL 12/03/2008  . PALPITATIONS, CHRONIC 12/03/2008  . UNSPECIFIED VITAMIN D DEFICIENCY 09/10/2008  . HLD (hyperlipidemia) 09/10/2008  . IRRITABLE BOWEL SYNDROME 02/12/2008     Past Medical History:  Diagnosis Date  . Anxiety    sees Dr. Nolen Mu  . Breast cancer (HCC) 2020  . Cyst of breast    per Dr. Lemar Livings  . Fibromyalgia   . GERD (gastroesophageal reflux disease)   . Heart murmur   . Hemorrhoids   . Herpes, genital 04/2015   confirmed with HSV 2 IgG  . Hiatal hernia   . Hypercholesterolemia    Dr. Rushie Goltz  . Hyperlipidemia   . IBS (irritable bowel syndrome)    constipation predominant  . IC (interstitial cystitis)    per [facility]  . Mild depression (HCC)   . MVP (mitral valve prolapse)    [facility] cards eval 2012  . Osteopenia    2010/2017, DEXA at [facility];spine and fem neck  . Vitamin D deficiency    history of     Past Surgical History:  Procedure Laterality Date  . ABDOMINAL HYSTERECTOMY    . APPENDECTOMY    . BLADDER SURGERY     1980's  . BREAST EXCISIONAL BIOPSY Right    benign  . BREAST EXCISIONAL BIOPSY Bilateral    benign  . COLONOSCOPY  09/2014   Dr. Mechele Collin  . COLONOSCOPY  2009   at [facility] 1 POLYP (BENIGN)  . excision of breast cysts     hx of  multiple cyst aspirations  . EXPLORATORY LAPAROTOMY    . TUBAL LIGATION      Social History   Socioeconomic History  . Marital status: Divorced    Spouse name: Not on file  . Number of children: 1  . Years of education: Not on file  . Highest education level: Not on file  Occupational History  . Occupation: [employer]  Social Needs  . Financial resource strain: Not on file  . Food insecurity    Worry: Not on file    Inability: Not on file  . Transportation needs    Medical: Not on file    Non-medical: Not on file  Tobacco Use  . Smoking status: Never Smoker  . Smokeless tobacco: Never Used  Substance and Sexual Activity  . Alcohol use: No    Alcohol/week: 0.0 standard drinks  . Drug use: No  . Sexual activity: Yes    Birth control/protection: Surgical    Comment: Hysterectomy  Lifestyle  . Physical activity    Days per week: Not on file  Minutes per session: Not on file  . Stress: Not on file  Relationships  . Social Musician on phone: Not on file    Gets together: Not on file    Attends religious service: Not on file    Active member of club or organization: Not on file    Attends meetings of clubs or organizations: Not on file    Relationship status: Not on file  . Intimate partner violence    Fear of current or ex partner: Not on file    Emotionally abused: Not on file    Physically abused: Not on file    Forced sexual activity: Not on file  Other Topics Concern  . Not on file  Social History Narrative   Married in 1998, divorced as of 2018, 1 son from previous relationship   Brother with MVA at 77, in rest home for many years as of 2019     Family History  Problem Relation Age of Onset  . Breast cancer Mother 23  . Hypertension Mother   . Colon cancer Father 53  . Hyperlipidemia Father   . Ovarian cancer Maternal Aunt 80  . Breast cancer Cousin 61  . Gallbladder disease Paternal Grandmother   . Liver cancer Cousin 70       Malignant       Current Outpatient Medications:  .  acetaminophen (TYLENOL) 500 MG tablet, Take 1,000 mg by mouth every 6 (six) hours as needed for moderate pain., Disp: , Rfl:  .  ALPRAZolam (XANAX) 0.5 MG tablet, Take 1 tablet (0.5 mg total) by mouth as needed for anxiety. Take 1-2 tablets as needed prior to your MRI, Disp: 2 tablet, Rfl: 0 .  atorvastatin (LIPITOR) 40 MG tablet, Take 40 mg by mouth daily., Disp: , Rfl: 1 .  Cholecalciferol (VITAMIN D) 125 MCG (5000 UT) CAPS, Take 5,000 Units by mouth daily. , Disp: , Rfl:  .  clonazePAM (KLONOPIN) 0.5 MG tablet, Take 0.5 mg by mouth 2 (two) times daily. , Disp: , Rfl:  .  DULoxetine (CYMBALTA) 60 MG capsule, Take 60 mg by mouth daily. , Disp: , Rfl:  .  gabapentin (NEURONTIN) 100 MG capsule, Take 1-2 capsules (100-200 mg total) by mouth 3 (three) times daily as needed. (Patient taking differently: Take 100 mg by mouth at bedtime. ), Disp: 90 capsule, Rfl: 1 .  loratadine (CLARITIN) 10 MG tablet, Take 1 tablet (10 mg total) by mouth daily., Disp: , Rfl:  .  omeprazole (PRILOSEC) 20 MG capsule, Take 20 mg by mouth daily. , Disp: , Rfl:  .  Plecanatide (TRULANCE) 3 MG TABS, Take 3 mg by mouth every other day. , Disp: , Rfl:  .  sennosides-docusate sodium (SENOKOT-S) 8.6-50 MG tablet, Take 1 tablet by mouth daily. Takes every other day, Disp: , Rfl:  .  valACYclovir (VALTREX) 500 MG tablet, Take 500 mg by mouth daily., Disp: , Rfl:  .  vitamin B-12 (CYANOCOBALAMIN) 500 MCG tablet, Take 500 mcg by mouth daily., Disp: , Rfl:  .  progesterone (PROMETRIUM) 100 MG capsule, Take 1 capsule (100 mg total) by mouth at bedtime. 6 nights on, 1 night off (Patient not taking: Reported on 07/29/2019), Disp: 90 capsule, Rfl: 3 .  vitamin B-12 (CYANOCOBALAMIN) 1000 MCG tablet, Take 1 tablet (1,000 mcg total) by mouth daily. (Patient not taking: Reported on 07/31/2019), Disp: , Rfl:    Physical exam:  Vitals:   07/31/19 0846  BP: 135/74  Pulse: 67  Resp: 20  Temp: 98.5 F  (36.9 C)  TempSrc: Tympanic   Physical Exam HENT:     Head: Normocephalic and atraumatic.  Eyes:     Pupils: Pupils are equal, round, and reactive to light.  Neck:     Musculoskeletal: Normal range of motion.  Cardiovascular:     Rate and Rhythm: Normal rate and regular rhythm.     Heart sounds: Normal heart sounds.  Pulmonary:     Effort: Pulmonary effort is normal.     Breath sounds: Normal breath sounds.  Abdominal:     General: Bowel sounds are normal.     Palpations: Abdomen is soft.  Skin:    General: Skin is warm and dry.  Neurological:     Mental Status: She is alert and oriented to person, place, and time.     Patient is a palpable right breast mass that measures roughly 6.4 cm by palpation today although unclear if some of this is induration in the setting of recent biopsy.  There is some skin erythema noted in the lower outer quadrant of her right breast which does not involve more than 1 part of her breast.  No palpable right axillary adenopathy   CMP Latest Ref Rng & Units 07/08/2019  Glucose 70 - 99 mg/dL 98  BUN 6 - 23 mg/dL 15  Creatinine 2.95 - 6.21 mg/dL 3.08  Sodium 657 - 846 mEq/L 142  Potassium 3.5 - 5.1 mEq/L 4.1  Chloride 96 - 112 mEq/L 104  CO2 19 - 32 mEq/L 33(H)  Calcium 8.4 - 10.5 mg/dL 9.5  Total Protein 6.0 - 8.3 g/dL 7.0  Total Bilirubin 0.2 - 1.2 mg/dL 0.5  Alkaline Phos 39 - 117 U/L 71  AST 0 - 37 U/L 18  ALT 0 - 35 U/L 15   CBC Latest Ref Rng & Units 06/25/2018  WBC 4.0 - 10.5 K/uL 8.9  Hemoglobin 12.0 - 15.0 g/dL 96.2  Hematocrit 95.2 - 46.0 % 37.6  Platelets 150.0 - 400.0 K/uL 247.0    No images are attached to the encounter.  US Breast Ltd Uni Right Inc Axilla  Result Date: 07/17/2019 CLINICAL DATA:  Patient describes a new palpable lump within the outer RIGHT breast. History of bilateral breast cysts. EXAM: DIGITAL DIAGNOSTIC RIGHT MAMMOGRAM WITH CAD AND TOMO ULTRASOUND RIGHT BREAST COMPARISON:  Previous exam(s). ACR Breast  Density Category c: The breast tissue is heterogeneously dense, which may obscure small masses. FINDINGS: There are multiple partially obscured masses within the upper-outer quadrant of the RIGHT breast, middle to posterior depth, largest measuring approximately 2.3 cm, corresponding to the area of clinical concern with overlying skin marker in place. There are no new dominant masses, suspicious calcifications or secondary signs of malignancy elsewhere within the RIGHT breast. Mammographic images were processed with CAD. Targeted ultrasound is performed, showing a nearly anechoic mass with indistinct margins in the RIGHT breast at the 7:30 o'clock axis, 5 cm from the nipple, measuring 2 cm greatest dimension, with surrounding shadowing tissues, the combination of nearly anechoic mass and surrounding shadowing tissues measures 2.7 x 2 x 2.3 cm, corresponding to the mammographic findings. There are at least at least 4 morphologically abnormal lymph nodes, largest with cortical thickness of 6 mm. IMPRESSION: 1. Nearly anechoic mass with indistinct margins in the RIGHT breast at the 7:30 o'clock axis, 5 cm from the nipple, measuring 2 cm, with surrounding shadowing tissues, the combination of mass and surrounding shadowing tissues measuring 2.7  cm, corresponding to the mammographic finding, possibly benign cyst surrounded by dense shadowing tissues. Recommend ultrasound-guided biopsy of the area of shadowing tissues surrounding the suspected cyst. Also recommend subsequent aspiration of the cyst. 2. Ultrasound-guided biopsy of 1 of the morphologically abnormal lymph nodes in the RIGHT axilla. RECOMMENDATION: 1. Core biopsy of the shadowing tissue surrounding the suspected cyst in the RIGHT breast at the 7:30 o'clock axis, 5 cm from nipple. 2. Aspiration of the suspected cyst within the RIGHT breast at the 7:30 o'clock axis, 5 cm from the nipple, measuring approximately 2 cm greatest dimension. 3. Ultrasound-guided core  biopsy of 1 of the morphologically abnormal lymph nodes in the RIGHT axilla. Ordering physician will be contacted with today's results and patient will then be scheduled for these ultrasound-guided procedures at her earliest convenience. I have discussed the findings and recommendations with the patient. Results were also provided in writing at the conclusion of the visit. If applicable, a reminder letter will be sent to the patient regarding the next appointment. BI-RADS CATEGORY  4: Suspicious. Electronically Signed   By: Bary Richard M.D.   On: 07/17/2019 16:42   Mm Diag Breast Tomo Uni Right  Result Date: 07/17/2019 CLINICAL DATA:  Patient describes a new palpable lump within the outer RIGHT breast. History of bilateral breast cysts. EXAM: DIGITAL DIAGNOSTIC RIGHT MAMMOGRAM WITH CAD AND TOMO ULTRASOUND RIGHT BREAST COMPARISON:  Previous exam(s). ACR Breast Density Category c: The breast tissue is heterogeneously dense, which may obscure small masses. FINDINGS: There are multiple partially obscured masses within the upper-outer quadrant of the RIGHT breast, middle to posterior depth, largest measuring approximately 2.3 cm, corresponding to the area of clinical concern with overlying skin marker in place. There are no new dominant masses, suspicious calcifications or secondary signs of malignancy elsewhere within the RIGHT breast. Mammographic images were processed with CAD. Targeted ultrasound is performed, showing a nearly anechoic mass with indistinct margins in the RIGHT breast at the 7:30 o'clock axis, 5 cm from the nipple, measuring 2 cm greatest dimension, with surrounding shadowing tissues, the combination of nearly anechoic mass and surrounding shadowing tissues measures 2.7 x 2 x 2.3 cm, corresponding to the mammographic findings. There are at least at least 4 morphologically abnormal lymph nodes, largest with cortical thickness of 6 mm. IMPRESSION: 1. Nearly anechoic mass with indistinct margins in  the RIGHT breast at the 7:30 o'clock axis, 5 cm from the nipple, measuring 2 cm, with surrounding shadowing tissues, the combination of mass and surrounding shadowing tissues measuring 2.7 cm, corresponding to the mammographic finding, possibly benign cyst surrounded by dense shadowing tissues. Recommend ultrasound-guided biopsy of the area of shadowing tissues surrounding the suspected cyst. Also recommend subsequent aspiration of the cyst. 2. Ultrasound-guided biopsy of 1 of the morphologically abnormal lymph nodes in the RIGHT axilla. RECOMMENDATION: 1. Core biopsy of the shadowing tissue surrounding the suspected cyst in the RIGHT breast at the 7:30 o'clock axis, 5 cm from nipple. 2. Aspiration of the suspected cyst within the RIGHT breast at the 7:30 o'clock axis, 5 cm from the nipple, measuring approximately 2 cm greatest dimension. 3. Ultrasound-guided core biopsy of 1 of the morphologically abnormal lymph nodes in the RIGHT axilla. Ordering physician will be contacted with today's results and patient will then be scheduled for these ultrasound-guided procedures at her earliest convenience. I have discussed the findings and recommendations with the patient. Results were also provided in writing at the conclusion of the visit. If applicable, a reminder letter will  be sent to the patient regarding the next appointment. BI-RADS CATEGORY  4: Suspicious. Electronically Signed   By: Bary Richard M.D.   On: 07/17/2019 16:42   Mm Clip Placement Right  Result Date: 07/24/2019 CLINICAL DATA:  Evaluate biopsy marker EXAM: DIAGNOSTIC RIGHT MAMMOGRAM POST ULTRASOUND BIOPSY COMPARISON:  Previous exam(s). FINDINGS: Mammographic images were obtained following ultrasound guided biopsy of a right breast mass. The coil shaped clip is within the biopsied right breast mass. The mass is smaller after biopsy. The HydroMARK clip could not be pulled into view but was clearly seen under ultrasound within the biopsied lymph node.  IMPRESSION: Appropriate clip placement within the right breast mass as above. The HydroMARK clip within the biopsied lymph node could not be pulled into view on this mammogram. Final Assessment: Post Procedure Mammograms for Marker Placement Electronically Signed   By: Gerome Sam III M.D   On: 07/24/2019 14:29   [place] Rt Breast Bx W Loc Dev 1st Lesion Img Bx Spec US Guide  Addendum Date: 07/29/2019   ADDENDUM REPORT: 07/29/2019 12:36 ADDENDUM: PATHOLOGY revealed: RIGHT breast mass at 7:30 o'clock, 5 cm from nipple- Invasive Mammary Carcinoma, with focal squamous differentiation. RIGHT axillary node- positive for metastatic carcinoma; measuring at least 7.5 mm in greatest extent, with extranodal extension. Pathology results are CONCORDANT with imaging findings, per Dr. Gerome Sam. Pathology results were discussed with patient via telephone. The patient reported doing well after the biopsy with tenderness at the site. Post biopsy care instructions were reviewed and questions were answered. The patient was encouraged to call [facility] for any additional concerns. Recommendation: Surgical referral. Request for surgical referral was relayed to nurse navigators at [facility] by Randa Lynn RN on 07/28/2019. Addendum by Randa Lynn RN on 07/29/2019. Electronically Signed   By: Gerome Sam III M.D   On: 07/29/2019 12:36   Result Date: 07/29/2019 CLINICAL DATA:  Biopsy right breast mass and abnormal right axillary node. EXAM: ULTRASOUND GUIDED RIGHT BREAST CORE NEEDLE BIOPSY COMPARISON:  Previous exam(s). FINDINGS: I met with the patient and we discussed the procedure of ultrasound-guided biopsy, including benefits and alternatives. We discussed the high likelihood of a successful procedure. We discussed the risks of the procedure, including infection, bleeding, tissue injury, clip migration, and inadequate sampling. Informed written consent was given. The usual time-out protocol  was performed immediately prior to the procedure. Lesion quadrant: Lower-outer Using sterile technique and 1% Lidocaine as local anesthetic, under direct ultrasound visualization, a 12 gauge spring-loaded device was used to perform biopsy of the 730 right breast mass using a medial approach. At the conclusion of the procedure a coil shaped tissue marker clip was deployed into the biopsy cavity. Follow up 2 view mammogram was performed and dictated separately. Lesion quadrant: Right axilla Using sterile technique and 1% Lidocaine as local anesthetic, under direct ultrasound visualization, a 14 gauge spring-loaded device was used to perform biopsy of a right axillary lymph node using a lateral approach. At the conclusion of the procedure a HydroMARK tissue marker clip was deployed into the biopsy cavity. Follow up 2 view mammogram was performed and dictated separately. IMPRESSION: Ultrasound guided biopsy of a right breast mass and a right axillary lymph node. No apparent complications. Electronically Signed: By: Gerome Sam III M.D On: 07/24/2019 14:22   [place] Rt Breast Bx W Loc Dev Ea Add Lesion Img Bx Spec US Guide  Addendum Date: 07/29/2019   ADDENDUM REPORT: 07/29/2019 12:36 ADDENDUM: PATHOLOGY revealed: RIGHT  breast mass at 7:30 o'clock, 5 cm from nipple- Invasive Mammary Carcinoma, with focal squamous differentiation. RIGHT axillary node- positive for metastatic carcinoma; measuring at least 7.5 mm in greatest extent, with extranodal extension. Pathology results are CONCORDANT with imaging findings, per Dr. Gerome Sam. Pathology results were discussed with patient via telephone. The patient reported doing well after the biopsy with tenderness at the site. Post biopsy care instructions were reviewed and questions were answered. The patient was encouraged to call [facility] for any additional concerns. Recommendation: Surgical referral. Request for surgical referral was relayed to nurse  navigators at [facility] by Randa Lynn RN on 07/28/2019. Addendum by Randa Lynn RN on 07/29/2019. Electronically Signed   By: Gerome Sam III M.D   On: 07/29/2019 12:36   Result Date: 07/29/2019 CLINICAL DATA:  Biopsy right breast mass and abnormal right axillary node. EXAM: ULTRASOUND GUIDED RIGHT BREAST CORE NEEDLE BIOPSY COMPARISON:  Previous exam(s). FINDINGS: I met with the patient and we discussed the procedure of ultrasound-guided biopsy, including benefits and alternatives. We discussed the high likelihood of a successful procedure. We discussed the risks of the procedure, including infection, bleeding, tissue injury, clip migration, and inadequate sampling. Informed written consent was given. The usual time-out protocol was performed immediately prior to the procedure. Lesion quadrant: Lower-outer Using sterile technique and 1% Lidocaine as local anesthetic, under direct ultrasound visualization, a 12 gauge spring-loaded device was used to perform biopsy of the 730 right breast mass using a medial approach. At the conclusion of the procedure a coil shaped tissue marker clip was deployed into the biopsy cavity. Follow up 2 view mammogram was performed and dictated separately. Lesion quadrant: Right axilla Using sterile technique and 1% Lidocaine as local anesthetic, under direct ultrasound visualization, a 14 gauge spring-loaded device was used to perform biopsy of a right axillary lymph node using a lateral approach. At the conclusion of the procedure a HydroMARK tissue marker clip was deployed into the biopsy cavity. Follow up 2 view mammogram was performed and dictated separately. IMPRESSION: Ultrasound guided biopsy of a right breast mass and a right axillary lymph node. No apparent complications. Electronically Signed: By: Gerome Sam III M.D On: 07/24/2019 14:22    Assessment and plan- Patient is a 63 y.o. female with newly diagnosed invasive mammary carcinoma of the  right breast  I discussed the results of the mammogram and biopsy with the patient in detail.  Patient was noted to have a normal mammogram in February 2020 and in 6 months she has a rapidly growing right breast mass with abnormal appearing lymph nodes.  Biopsy does reveal grade 3 invasive mammary carcinoma.  ER PR and HER-2/neu status is currently pending but given how rapidly this mass has grown I suspect she either has triple negative or HER-2 positive disease.  Patient will need neoadjuvant chemotherapy prior to surgery regardless of ER PR and HER-2 status given the large size of the mass as well as lymph node involvement.  At this time I would recommend:  1.  MRI of the bilateral breasts which is due later today to assess the true extent of her mass and lymph node involvement 2.  PET CT scan to complete her staging work-up.  She at least has T2N1 disease based on her mammogram and ultrasound 3.  Patient will need port placement which is due next week as well as baseline MUGA scan in anticipation of starting chemotherapy. 4.  Patient will need cardiology clearance to  give her anthracycline or Herceptin based chemotherapy based on her final pathology and will need to see Dr. Lady Gary after her echocardiogram. 5.  We will refer the patient for genetic counseling as well.  I will tentatively see her in 3 to 4 days time once her PET CT scan and final ER PR and HER-2 status is back to decide chemotherapy regimen.  Chemotherapy will be curative if there is no evidence of metastatic disease and palliative there is evidence of metastatic disease on PET scan.  Patient verbalized understanding of the plan   Total face to face encounter time for this patient visit was 40 min. >50% of the time was  spent in counseling and coordination of care.     Thank you for this kind referral and the opportunity to participate in the care of this patient   Visit Diagnosis 1. Malignant neoplasm of lower-outer quadrant  of right female breast, unspecified estrogen receptor status (HCC)     Dr. Owens Shark, MD, MPH [facility] at [facility] [phone] 07/31/2019  11:28 AM

## 2019-08-01 ENCOUNTER — Inpatient Hospital Stay: Payer: Medicare HMO | Admitting: Oncology

## 2019-08-01 ENCOUNTER — Encounter
Admission: RE | Admit: 2019-08-01 | Discharge: 2019-08-01 | Disposition: A | Discharge status: Home / Self Care | Payer: Medicare HMO | Source: Ambulatory Visit | Attending: Oncology | Admitting: Oncology

## 2019-08-01 ENCOUNTER — Other Ambulatory Visit: Payer: Self-pay | Admitting: Oncology

## 2019-08-01 ENCOUNTER — Telehealth: Payer: Self-pay | Admitting: *Deleted

## 2019-08-01 ENCOUNTER — Other Ambulatory Visit
Admission: RE | Admit: 2019-08-01 | Discharge: 2019-08-01 | Disposition: A | Discharge status: Home / Self Care | Payer: Medicare HMO | Source: Ambulatory Visit | Attending: Surgery | Admitting: Surgery

## 2019-08-01 ENCOUNTER — Other Ambulatory Visit: Payer: Self-pay | Admitting: *Deleted

## 2019-08-01 DIAGNOSIS — Z20828 Contact with and (suspected) exposure to other viral communicable diseases: Secondary | ICD-10-CM | POA: Insufficient documentation

## 2019-08-01 DIAGNOSIS — N6320 Unspecified lump in the left breast, unspecified quadrant: Secondary | ICD-10-CM

## 2019-08-01 DIAGNOSIS — Z5111 Encounter for antineoplastic chemotherapy: Secondary | ICD-10-CM | POA: Diagnosis not present

## 2019-08-01 DIAGNOSIS — Z01812 Encounter for preprocedural laboratory examination: Secondary | ICD-10-CM | POA: Insufficient documentation

## 2019-08-01 DIAGNOSIS — C50911 Malignant neoplasm of unspecified site of right female breast: Secondary | ICD-10-CM | POA: Diagnosis not present

## 2019-08-01 DIAGNOSIS — C50511 Malignant neoplasm of lower-outer quadrant of right female breast: Secondary | ICD-10-CM | POA: Insufficient documentation

## 2019-08-01 DIAGNOSIS — Z0189 Encounter for other specified special examinations: Secondary | ICD-10-CM | POA: Insufficient documentation

## 2019-08-01 DIAGNOSIS — R9389 Abnormal findings on diagnostic imaging of other specified body structures: Secondary | ICD-10-CM

## 2019-08-01 LAB — SURGICAL PATHOLOGY

## 2019-08-01 LAB — SARS CORONAVIRUS 2 (TAT 6-24 HRS): SARS Coronavirus 2: NEGATIVE

## 2019-08-01 MED ORDER — TECHNETIUM TC 99M-LABELED RED BLOOD CELLS IV KIT
21.2800 | PACK | Freq: Once | INTRAVENOUS | Status: AC | PRN
  Administered 2019-08-01: 13:00:00 21.28 via INTRAVENOUS

## 2019-08-01 NOTE — Telephone Encounter (Signed)
Left message on patient voice mail ok to get flu shot, but we are not offering them yet so she can go to her pharmacy to get it

## 2019-08-01 NOTE — Telephone Encounter (Signed)
Yes she can get her flu shot

## 2019-08-01 NOTE — Telephone Encounter (Signed)
Called pt and let her know that I just got info that insurance approved the MUGA and I have called scheduling and it can still be done today at 12:30 and she will need to [facility] for the test at 12:15. She is agreeable and will be there

## 2019-08-01 NOTE — Telephone Encounter (Signed)
Patient called asking Dr Janese Banks if it would be alright if she gets a flu shot before she starts chemotherapy. Please advise

## 2019-08-02 ENCOUNTER — Encounter: Payer: Self-pay | Admitting: Anesthesiology

## 2019-08-02 DIAGNOSIS — R69 Illness, unspecified: Secondary | ICD-10-CM | POA: Diagnosis not present

## 2019-08-05 ENCOUNTER — Ambulatory Visit
Admission: RE | Admit: 2019-08-05 | Discharge: 2019-08-05 | Disposition: A | Discharge status: Home / Self Care | Payer: Medicare HMO | Attending: Surgery | Admitting: Surgery

## 2019-08-05 ENCOUNTER — Ambulatory Visit: Payer: Medicare HMO

## 2019-08-05 ENCOUNTER — Telehealth (INDEPENDENT_AMBULATORY_CARE_PROVIDER_SITE_OTHER): Payer: Self-pay

## 2019-08-05 ENCOUNTER — Other Ambulatory Visit (INDEPENDENT_AMBULATORY_CARE_PROVIDER_SITE_OTHER): Payer: Self-pay | Admitting: Nurse Practitioner

## 2019-08-05 ENCOUNTER — Ambulatory Visit: Payer: Medicare HMO | Admitting: Anesthesiology

## 2019-08-05 ENCOUNTER — Encounter
Admission: RE | Disposition: A | Discharge status: Home / Self Care | Payer: Self-pay | Source: Home / Self Care | Attending: Surgery

## 2019-08-05 ENCOUNTER — Encounter: Payer: Self-pay | Admitting: Surgery

## 2019-08-05 DIAGNOSIS — E785 Hyperlipidemia, unspecified: Secondary | ICD-10-CM | POA: Insufficient documentation

## 2019-08-05 DIAGNOSIS — E7800 Pure hypercholesterolemia, unspecified: Secondary | ICD-10-CM | POA: Insufficient documentation

## 2019-08-05 DIAGNOSIS — C50411 Malignant neoplasm of upper-outer quadrant of right female breast: Secondary | ICD-10-CM | POA: Insufficient documentation

## 2019-08-05 DIAGNOSIS — Z881 Allergy status to other antibiotic agents status: Secondary | ICD-10-CM | POA: Insufficient documentation

## 2019-08-05 DIAGNOSIS — K581 Irritable bowel syndrome with constipation: Secondary | ICD-10-CM | POA: Diagnosis not present

## 2019-08-05 DIAGNOSIS — C50511 Malignant neoplasm of lower-outer quadrant of right female breast: Secondary | ICD-10-CM

## 2019-08-05 DIAGNOSIS — M5030 Other cervical disc degeneration, unspecified cervical region: Secondary | ICD-10-CM | POA: Diagnosis not present

## 2019-08-05 DIAGNOSIS — F329 Major depressive disorder, single episode, unspecified: Secondary | ICD-10-CM | POA: Insufficient documentation

## 2019-08-05 DIAGNOSIS — I517 Cardiomegaly: Secondary | ICD-10-CM | POA: Diagnosis not present

## 2019-08-05 DIAGNOSIS — K219 Gastro-esophageal reflux disease without esophagitis: Secondary | ICD-10-CM | POA: Insufficient documentation

## 2019-08-05 DIAGNOSIS — C773 Secondary and unspecified malignant neoplasm of axilla and upper limb lymph nodes: Secondary | ICD-10-CM | POA: Insufficient documentation

## 2019-08-05 DIAGNOSIS — Z803 Family history of malignant neoplasm of breast: Secondary | ICD-10-CM | POA: Diagnosis not present

## 2019-08-05 DIAGNOSIS — N3010 Interstitial cystitis (chronic) without hematuria: Secondary | ICD-10-CM | POA: Diagnosis not present

## 2019-08-05 DIAGNOSIS — Z95828 Presence of other vascular implants and grafts: Secondary | ICD-10-CM | POA: Diagnosis not present

## 2019-08-05 DIAGNOSIS — M797 Fibromyalgia: Secondary | ICD-10-CM | POA: Diagnosis not present

## 2019-08-05 DIAGNOSIS — Z886 Allergy status to analgesic agent status: Secondary | ICD-10-CM | POA: Diagnosis not present

## 2019-08-05 DIAGNOSIS — Z882 Allergy status to sulfonamides status: Secondary | ICD-10-CM | POA: Insufficient documentation

## 2019-08-05 DIAGNOSIS — M62830 Muscle spasm of back: Secondary | ICD-10-CM | POA: Diagnosis not present

## 2019-08-05 DIAGNOSIS — E559 Vitamin D deficiency, unspecified: Secondary | ICD-10-CM | POA: Insufficient documentation

## 2019-08-05 DIAGNOSIS — C50911 Malignant neoplasm of unspecified site of right female breast: Secondary | ICD-10-CM | POA: Diagnosis present

## 2019-08-05 DIAGNOSIS — F419 Anxiety disorder, unspecified: Secondary | ICD-10-CM | POA: Insufficient documentation

## 2019-08-05 DIAGNOSIS — R69 Illness, unspecified: Secondary | ICD-10-CM | POA: Diagnosis not present

## 2019-08-05 DIAGNOSIS — C50919 Malignant neoplasm of unspecified site of unspecified female breast: Secondary | ICD-10-CM | POA: Diagnosis not present

## 2019-08-05 DIAGNOSIS — Z880 Allergy status to penicillin: Secondary | ICD-10-CM | POA: Insufficient documentation

## 2019-08-05 DIAGNOSIS — Z888 Allergy status to other drugs, medicaments and biological substances status: Secondary | ICD-10-CM | POA: Insufficient documentation

## 2019-08-05 HISTORY — PX: PORTACATH PLACEMENT: SHX2246

## 2019-08-05 SURGERY — INSERTION, TUNNELED CENTRAL VENOUS DEVICE, WITH PORT
Anesthesia: General | Site: Chest | Laterality: Left

## 2019-08-05 MED ORDER — DEXMEDETOMIDINE HCL 200 MCG/2ML IV SOLN
INTRAVENOUS | Status: DC | PRN
  Administered 2019-08-05 (×5): 4 ug via INTRAVENOUS

## 2019-08-05 MED ORDER — LACTATED RINGERS IV SOLN
INTRAVENOUS | Status: DC
  Administered 2019-08-05: 08:00:00 via INTRAVENOUS

## 2019-08-05 MED ORDER — LIDOCAINE HCL (PF) 1 % IJ SOLN
INTRAMUSCULAR | Status: AC
  Filled 2019-08-05: qty 30

## 2019-08-05 MED ORDER — ONDANSETRON HCL 4 MG/2ML IJ SOLN: 4.0000 mg | Freq: Once | INTRAMUSCULAR | Status: DC | PRN

## 2019-08-05 MED ORDER — ONDANSETRON HCL 4 MG/2ML IJ SOLN
INTRAMUSCULAR | Status: AC
  Administered 2019-08-05: 4 mg via INTRAVENOUS
  Filled 2019-08-05: qty 2

## 2019-08-05 MED ORDER — CLINDAMYCIN PHOSPHATE 900 MG/50ML IV SOLN
900.0000 mg | INTRAVENOUS | Status: AC
  Administered 2019-08-05: 900 mg via INTRAVENOUS

## 2019-08-05 MED ORDER — MIDAZOLAM HCL 2 MG/2ML IJ SOLN
INTRAMUSCULAR | Status: DC | PRN
  Administered 2019-08-05: 2 mg via INTRAVENOUS

## 2019-08-05 MED ORDER — LIDOCAINE HCL (PF) 1 % IJ SOLN
INTRAMUSCULAR | Status: DC | PRN
  Administered 2019-08-05: 12.5 mL

## 2019-08-05 MED ORDER — FENTANYL CITRATE (PF) 100 MCG/2ML IJ SOLN: 25.0000 ug | INTRAMUSCULAR | Status: DC | PRN

## 2019-08-05 MED ORDER — CHLORHEXIDINE GLUCONATE CLOTH 2 % EX PADS: 6.0000 | MEDICATED_PAD | Freq: Once | CUTANEOUS | Status: DC

## 2019-08-05 MED ORDER — CLINDAMYCIN PHOSPHATE 900 MG/50ML IV SOLN
INTRAVENOUS | Status: AC
  Filled 2019-08-05: qty 50

## 2019-08-05 MED ORDER — HEPARIN SODIUM (PORCINE) 5000 UNIT/ML IJ SOLN
INTRAMUSCULAR | Status: AC
  Filled 2019-08-05: qty 1

## 2019-08-05 MED ORDER — ONDANSETRON HCL 4 MG PO TABS: 4.0000 mg | ORAL_TABLET | Freq: Three times a day (TID) | ORAL | 0 refills | Status: DC | PRN

## 2019-08-05 MED ORDER — BUPIVACAINE-EPINEPHRINE (PF) 0.25% -1:200000 IJ SOLN
INTRAMUSCULAR | Status: AC
  Filled 2019-08-05: qty 30

## 2019-08-05 MED ORDER — PROPOFOL 10 MG/ML IV BOLUS
INTRAVENOUS | Status: AC
  Filled 2019-08-05: qty 20

## 2019-08-05 MED ORDER — PROPOFOL 10 MG/ML IV BOLUS
INTRAVENOUS | Status: DC | PRN
  Administered 2019-08-05 (×6): 20 mg via INTRAVENOUS
  Administered 2019-08-05: 30 mg via INTRAVENOUS
  Administered 2019-08-05 (×5): 20 mg via INTRAVENOUS
  Administered 2019-08-05: 10 mg via INTRAVENOUS
  Administered 2019-08-05 (×3): 20 mg via INTRAVENOUS
  Administered 2019-08-05: 30 mg via INTRAVENOUS
  Administered 2019-08-05 (×2): 20 mg via INTRAVENOUS

## 2019-08-05 MED ORDER — FENTANYL CITRATE (PF) 100 MCG/2ML IJ SOLN
INTRAMUSCULAR | Status: AC
  Filled 2019-08-05: qty 2

## 2019-08-05 MED ORDER — FENTANYL CITRATE (PF) 100 MCG/2ML IJ SOLN
INTRAMUSCULAR | Status: DC | PRN
  Administered 2019-08-05 (×4): 25 ug via INTRAVENOUS

## 2019-08-05 MED ORDER — DEXMEDETOMIDINE HCL IN NACL 80 MCG/20ML IV SOLN
INTRAVENOUS | Status: AC
  Filled 2019-08-05: qty 20

## 2019-08-05 MED ORDER — MIDAZOLAM HCL 2 MG/2ML IJ SOLN
INTRAMUSCULAR | Status: AC
  Filled 2019-08-05: qty 2

## 2019-08-05 MED ORDER — LIDOCAINE HCL (PF) 2 % IJ SOLN
INTRAMUSCULAR | Status: AC
  Filled 2019-08-05: qty 10

## 2019-08-05 MED ORDER — BUPIVACAINE-EPINEPHRINE (PF) 0.25% -1:200000 IJ SOLN
INTRAMUSCULAR | Status: DC | PRN
  Administered 2019-08-05: 12.5 mL via PERINEURAL

## 2019-08-05 MED ORDER — HYDROCODONE-ACETAMINOPHEN 5-325 MG PO TABS: 1.0000 | ORAL_TABLET | Freq: Four times a day (QID) | ORAL | 0 refills | Status: DC | PRN

## 2019-08-05 MED ORDER — PROPOFOL 500 MG/50ML IV EMUL
INTRAVENOUS | Status: DC | PRN
  Administered 2019-08-05: 25 ug/kg/min via INTRAVENOUS

## 2019-08-05 MED ORDER — PROPOFOL 500 MG/50ML IV EMUL: INTRAVENOUS | Status: DC | PRN

## 2019-08-05 MED ORDER — ONDANSETRON HCL 4 MG/2ML IJ SOLN
4.0000 mg | Freq: Once | INTRAMUSCULAR | Status: AC
  Administered 2019-08-05: 08:00:00 4 mg via INTRAVENOUS

## 2019-08-05 MED ORDER — ONDANSETRON HCL 4 MG/2ML IJ SOLN
INTRAMUSCULAR | Status: AC
  Filled 2019-08-05: qty 2

## 2019-08-05 MED ORDER — LIDOCAINE HCL (CARDIAC) PF 100 MG/5ML IV SOSY
PREFILLED_SYRINGE | INTRAVENOUS | Status: DC | PRN
  Administered 2019-08-05: 60 mg via INTRATRACHEAL

## 2019-08-05 SURGICAL SUPPLY — 39 items
BAG DECANTER FOR FLEXI CONT (MISCELLANEOUS) ×2 IMPLANT
BLADE SURG SZ11 CARB STEEL (BLADE) ×2 IMPLANT
BOOT SUTURE AID YELLOW STND (SUTURE) ×2 IMPLANT
CANISTER SUCT 1200ML W/VALVE (MISCELLANEOUS) ×2 IMPLANT
CHLORAPREP W/TINT 26 (MISCELLANEOUS) ×2 IMPLANT
COVER LIGHT HANDLE STERIS (MISCELLANEOUS) ×4 IMPLANT
COVER WAND RF STERILE (DRAPES) ×2 IMPLANT
DERMABOND ADVANCED (GAUZE/BANDAGES/DRESSINGS) ×1
DERMABOND ADVANCED .7 DNX12 (GAUZE/BANDAGES/DRESSINGS) ×1 IMPLANT
DRAPE 3/4 80X56 (DRAPES) ×2 IMPLANT
DRAPE C-ARM XRAY 36X54 (DRAPES) ×4 IMPLANT
DRAPE INCISE IOBAN 66X45 STRL (DRAPES) ×2 IMPLANT
ELECT CAUTERY BLADE 6.4 (BLADE) ×2 IMPLANT
ELECT REM PT RETURN 9FT ADLT (ELECTROSURGICAL) ×2
ELECTRODE REM PT RTRN 9FT ADLT (ELECTROSURGICAL) ×1 IMPLANT
GEL ULTRASOUND 20GR AQUASONIC (MISCELLANEOUS) ×3 IMPLANT
GLOVE BIO SURGEON STRL SZ7 (GLOVE) ×2 IMPLANT
GOWN STRL REUS W/ TWL LRG LVL3 (GOWN DISPOSABLE) ×2 IMPLANT
GOWN STRL REUS W/TWL LRG LVL3 (GOWN DISPOSABLE) ×2
IV NS 500ML (IV SOLUTION) ×1
IV NS 500ML BAXH (IV SOLUTION) ×1 IMPLANT
KIT PORT POWER 8FR ISP CVUE (Port) ×2 IMPLANT
MICROPUNCTURE 5FR NT-U-SST (MISCELLANEOUS) ×2
NEEDLE HYPO 22GX1.5 SAFETY (NEEDLE) ×2 IMPLANT
NS IRRIG 1000ML POUR BTL (IV SOLUTION) ×2 IMPLANT
PACK PORT-A-CATH (MISCELLANEOUS) ×1 IMPLANT
SET MICROPUNCTURE 5FR NT-U-SST (MISCELLANEOUS) IMPLANT
SPONGE LAP 18X18 RF (DISPOSABLE) ×2 IMPLANT
SUT MNCRL AB 4-0 PS2 18 (SUTURE) ×2 IMPLANT
SUT PROLENE 2-0 (SUTURE) ×1
SUT PROLENE 2-0 RB1 36X2 ARM (SUTURE) ×1
SUT VIC AB 3-0 SH 27 (SUTURE) ×1
SUT VIC AB 3-0 SH 27X BRD (SUTURE) ×1 IMPLANT
SUTURE PROLEN 2-0 RB1 36X2 ARM (SUTURE) ×1 IMPLANT
SYR 10ML LL (SYRINGE) ×2 IMPLANT
SYR 10ML SLIP (SYRINGE) ×1 IMPLANT
SYR 20ML LL LF (SYRINGE) ×2 IMPLANT
SYR 5ML LL (SYRINGE) ×2 IMPLANT
TOWEL OR 17X26 4PK STRL BLUE (TOWEL DISPOSABLE) ×2 IMPLANT

## 2019-08-05 NOTE — OR Nursing (Signed)
On arrival to PACU noted redness anterior mid neck to upper chest extending to upper left shoulder.  No itching , blistering or broken skin noted.  Dr, Dahlia Byes at bedside and aware, states to observe. No treatment at this point

## 2019-08-05 NOTE — Anesthesia Postprocedure Evaluation (Signed)
Anesthesia Post Note  Patient: Debra Little  Procedure(s) Performed: INSERTION PORT-A-CATH, Attempted (Left Chest)  Patient location during evaluation: PACU Anesthesia Type: General Level of consciousness: awake and alert Pain management: pain level controlled Vital Signs Assessment: post-procedure vital signs reviewed and stable Respiratory status: spontaneous breathing, nonlabored ventilation, respiratory function stable and patient connected to nasal cannula oxygen Cardiovascular status: blood pressure returned to baseline and stable Postop Assessment: no apparent nausea or vomiting Anesthetic complications: no     Last Vitals:  Vitals:   08/05/19 1052 08/05/19 1107  BP: 123/67 127/64  Pulse: 76 75  Resp: 16 15  Temp:  36.8 C  SpO2: 98% 98%    Last Pain:  Vitals:   08/05/19 1107  TempSrc:   PainSc: 0-No pain                 Keaston Pile S

## 2019-08-05 NOTE — Op Note (Signed)
  Pre-operative Diagnosis: Right breast CA  Post-operative Diagnosis: same   Surgeon: Caroleen Hamman, MD FACS  Anesthesia: IV propofol, marcaine .25% w epi and lidocaine 1%  Procedure: Attempted placement of Left IJ port  Findings: Patent Left IJ, Wire within vein. Unable to advance wire past the Left innominate vein. Unable to cannulate Left subclavian vein   Estimated Blood Loss: 10cc         Drains: None         Specimens: None                 Procedure Details  The patient was seen again in the Holding Room. The benefits, complications, treatment options, and expected outcomes were discussed with the patient. The risks of bleeding, infection, recurrence of symptoms, failure to resolve symptoms,  thrombosis nonfunction breakage pneumothorax hemopneumothorax any of which could require chest tube or further surgery were reviewed with the patient.   The patient was taken to Operating Room, identified as Debra Little and the procedure verified.  A Time Out was held and the above information confirmed.  Prior to the induction of general anesthesia, antibiotic prophylaxis was administered. VTE prophylaxis was in place. Appropriate anesthesia was then administered and tolerated well. The chest was prepped with Chloraprep and draped in the sterile fashion. The patient was positioned in the supine position. Then the patient was placed in Trendelenburg position.  Patient was prepped and draped in sterile fashion and in a Trendelenburg position local anesthetic was infiltrated into the skin and subcutaneous tissues in the neck and anterior chest wall. The large bore needle was placed into the internal jugular vein under U/S guidance without difficulty and then the Seldinger wire was advanced. A small pocket on the left chest was created and a catheter was tunneled from the chest  to the neck. Fluoroscopy was utilized , the dilator was inserted under fluoroscopy. The wire did not past the  left innominate vein. I change to a micro wire and fed it via the vascular sheet. I was still unable to pass the wire after manipulation of the wire.  I used a micropuncture needle to try to cannulize the Left [place] vein , I aspirated dark, non pulsatile blood and inserted the micropuncture wire. The wire went caudal and I was unsure If it was in fact in the venous system. I did not want to dilate the tract and aborted the procedure.  I did discuss with Dr. Delana Meyer in detail  from vascular surgery before aborting the procedure. He recommended venogram to delineate the anatomy and to attempt to place the port on the left side given potential radiation treatments down the line. We will arrange this as outpt. The wire was removed and I placed pressure for 5 minutes for hemostasis. No evidence of expanding hematoma or vascular injuries were observed. The wound was closed with interrupted 3-0 Vicryl followed by 4-0 subcuticular Monocryl sutures. Dermabond used to coat the skin  Patient was taken to the recovery room in stable condition where a postoperative chest film has been ordered.

## 2019-08-05 NOTE — Transfer of Care (Signed)
Immediate Anesthesia Transfer of Care Note  Patient: Debra Little  Procedure(s) Performed: INSERTION PORT-A-CATH, Attempted (Left Chest)  Patient Location: PACU  Anesthesia Type:General  Level of Consciousness: awake, alert  and oriented  Airway & Oxygen Therapy: Patient Spontanous Breathing and Patient connected to face mask oxygen  Post-op Assessment: Report given to RN, Post -op Vital signs reviewed and stable and Patient moving all extremities X 4  Post vital signs: Reviewed and stable  Last Vitals:  Vitals Value Taken Time  BP 122/63 08/05/19 1022  Temp 36.7 C 08/05/19 1022  Pulse 78 08/05/19 1024  Resp 19 08/05/19 1024  SpO2 100 % 08/05/19 1024  Vitals shown include unvalidated device data.  Last Pain:  Vitals:   08/05/19 0749  TempSrc: Tympanic  PainSc:          Complications: No apparent anesthesia complications

## 2019-08-05 NOTE — Telephone Encounter (Signed)
I attempted to contact the patient and a message was left for a return call. Unable to schedule the patient until 3:30 pm appt in angio with Dr. Delana Meyer. Patient will need to do her Covid testing today or at 11:30 am on 08/06/2019.   Dr. Dahlia Byes: Marya Amsler This is the pt I could not place the port in. Right breast CA. Left IJ patent but could not advance wire past the Left Innominate vein. I will arrange for you to do the venogram and port as discussed. Archana I just wanted to keep you in the loop Dr. Delana Meyer: my hope is that she has some tough anatomy and in the cath lab with lots of different wires and catheters I will be able to get a left IJ. Pictures will be very helpful. I'll see her in the office on Thursday and get her port in on Tuesday. Hi Mickel Baas could you give the patient a call and set her up for Thursday in the office if that doesn't work for her then Monday Thanks Dr. Janese Banks: She is down for chemo on Monday Dr. Delana Meyer: have plenty of time to do the port tomorrow but she would have to be ok with meeting me the day of surgery if tha's OK with her it's easier for me Dr. Dahlia Byes: let me talk to her and I will let you know . I think she should be ok Dr. Janese Banks: actually tomorrow is wednesday and she only has a pet scan scheduled. her 2 other appointments are on thursday not tomorrow. if you could work around her pet scan. thank you so much

## 2019-08-05 NOTE — Interval H&P Note (Signed)
History and Physical Interval Note:  08/05/2019 7:33 AM  Debra Little  has presented today for surgery, with the diagnosis of INVASIVE MAMMARY CARCINOMA.  The various methods of treatment have been discussed with the patient and family. After consideration of risks, benefits and other options for treatment, the patient has consented to  Procedure(s): INSERTION PORT-A-CATH (N/A) as a surgical intervention.  The patient's history has been reviewed, patient examined, no change in status, stable for surgery.  I have reviewed the patient's chart and labs.  Questions were answered to the patient's satisfaction.     [place]

## 2019-08-05 NOTE — Anesthesia Post-op Follow-up Note (Signed)
Anesthesia QCDR form completed.        

## 2019-08-05 NOTE — Discharge Instructions (Signed)

## 2019-08-05 NOTE — Anesthesia Preprocedure Evaluation (Signed)
Anesthesia Evaluation  Patient identified by MRN, date of birth, ID band Patient awake    Reviewed: Allergy & Precautions, NPO status , Patient's Chart, lab work & pertinent test results, reviewed documented beta blocker date and time   Airway Mallampati: II  TM Distance: >3 FB     Dental  (+) Chipped   Pulmonary           Cardiovascular + Valvular Problems/Murmurs      Neuro/Psych PSYCHIATRIC DISORDERS Anxiety Depression  Neuromuscular disease    GI/Hepatic hiatal hernia, GERD  Controlled,  Endo/Other    Renal/GU      Musculoskeletal  (+) Fibromyalgia -  Abdominal   Peds  Hematology   Anesthesia Other Findings   Reproductive/Obstetrics                             Anesthesia Physical Anesthesia Plan  ASA: III  Anesthesia Plan: MAC   Post-op Pain Management:    Induction: Intravenous  PONV Risk Score and Plan:   Airway Management Planned:   Additional Equipment:   Intra-op Plan:   Post-operative Plan:   Informed Consent: I have reviewed the patients History and Physical, chart, labs and discussed the procedure including the risks, benefits and alternatives for the proposed anesthesia with the patient or authorized representative who has indicated his/her understanding and acceptance.       Plan Discussed with: CRNA  Anesthesia Plan Comments:         Anesthesia Quick Evaluation

## 2019-08-06 ENCOUNTER — Ambulatory Visit
Admit: 2019-08-06 | Discharge: 2019-08-06 | Disposition: A | Discharge status: Home / Self Care | Payer: Medicare HMO | Attending: Oncology | Admitting: Oncology

## 2019-08-06 ENCOUNTER — Encounter
Admission: RE | Disposition: A | Discharge status: Home / Self Care | Payer: Self-pay | Source: Home / Self Care | Attending: Vascular Surgery

## 2019-08-06 ENCOUNTER — Inpatient Hospital Stay (HOSPITAL_BASED_OUTPATIENT_CLINIC_OR_DEPARTMENT_OTHER): Payer: Medicare HMO | Admitting: Oncology

## 2019-08-06 ENCOUNTER — Other Ambulatory Visit: Payer: Self-pay | Admitting: Oncology

## 2019-08-06 ENCOUNTER — Ambulatory Visit: Payer: Medicare HMO

## 2019-08-06 ENCOUNTER — Telehealth: Payer: Self-pay | Admitting: *Deleted

## 2019-08-06 ENCOUNTER — Ambulatory Visit: Payer: Medicare HMO | Admitting: Surgery

## 2019-08-06 ENCOUNTER — Encounter: Payer: Self-pay | Admitting: Oncology

## 2019-08-06 ENCOUNTER — Ambulatory Visit
Admission: RE | Admit: 2019-08-06 | Discharge: 2019-08-06 | Disposition: A | Discharge status: Home / Self Care | Payer: Medicare HMO | Attending: Vascular Surgery | Admitting: Vascular Surgery

## 2019-08-06 ENCOUNTER — Ambulatory Visit: Payer: Self-pay | Admitting: Surgery

## 2019-08-06 ENCOUNTER — Other Ambulatory Visit: Payer: Self-pay

## 2019-08-06 ENCOUNTER — Other Ambulatory Visit (INDEPENDENT_AMBULATORY_CARE_PROVIDER_SITE_OTHER): Payer: Self-pay | Admitting: Nurse Practitioner

## 2019-08-06 DIAGNOSIS — C50511 Malignant neoplasm of lower-outer quadrant of right female breast: Secondary | ICD-10-CM

## 2019-08-06 DIAGNOSIS — Z7189 Other specified counseling: Secondary | ICD-10-CM | POA: Diagnosis not present

## 2019-08-06 DIAGNOSIS — R5383 Other fatigue: Secondary | ICD-10-CM | POA: Diagnosis not present

## 2019-08-06 DIAGNOSIS — Z170 Estrogen receptor positive status [ER+]: Secondary | ICD-10-CM | POA: Insufficient documentation

## 2019-08-06 DIAGNOSIS — C50911 Malignant neoplasm of unspecified site of right female breast: Secondary | ICD-10-CM | POA: Diagnosis not present

## 2019-08-06 DIAGNOSIS — C50611 Malignant neoplasm of axillary tail of right female breast: Secondary | ICD-10-CM | POA: Diagnosis not present

## 2019-08-06 DIAGNOSIS — R69 Illness, unspecified: Secondary | ICD-10-CM | POA: Diagnosis not present

## 2019-08-06 DIAGNOSIS — R5381 Other malaise: Secondary | ICD-10-CM

## 2019-08-06 DIAGNOSIS — C50919 Malignant neoplasm of unspecified site of unspecified female breast: Secondary | ICD-10-CM

## 2019-08-06 DIAGNOSIS — Z171 Estrogen receptor negative status [ER-]: Secondary | ICD-10-CM | POA: Diagnosis not present

## 2019-08-06 DIAGNOSIS — F419 Anxiety disorder, unspecified: Secondary | ICD-10-CM

## 2019-08-06 HISTORY — PX: PORTA CATH INSERTION: CATH118285

## 2019-08-06 LAB — GLUCOSE, CAPILLARY: Glucose-Capillary: 89 mg/dL (ref 70–99)

## 2019-08-06 SURGERY — PORTA CATH INSERTION
Anesthesia: Moderate Sedation

## 2019-08-06 MED ORDER — DIPHENHYDRAMINE HCL 50 MG/ML IJ SOLN: 50.0000 mg | Freq: Once | INTRAMUSCULAR | Status: DC | PRN

## 2019-08-06 MED ORDER — ONDANSETRON HCL 4 MG/2ML IJ SOLN
4.0000 mg | Freq: Four times a day (QID) | INTRAMUSCULAR | Status: DC | PRN
  Administered 2019-08-06: 12:00:00 4 mg via INTRAVENOUS

## 2019-08-06 MED ORDER — ACETAMINOPHEN 500 MG PO TABS
1000.0000 mg | ORAL_TABLET | Freq: Once | ORAL | Status: AC
  Administered 2019-08-06: 1000 mg via ORAL

## 2019-08-06 MED ORDER — FENTANYL CITRATE (PF) 100 MCG/2ML IJ SOLN
INTRAMUSCULAR | Status: DC | PRN
  Administered 2019-08-06: 50 ug via INTRAVENOUS
  Administered 2019-08-06: 25 ug via INTRAVENOUS
  Administered 2019-08-06 (×2): 50 ug via INTRAVENOUS

## 2019-08-06 MED ORDER — SODIUM CHLORIDE 0.9 % IV SOLN: INTRAVENOUS | Status: DC

## 2019-08-06 MED ORDER — ACETAMINOPHEN 500 MG PO TABS
ORAL_TABLET | ORAL | Status: AC
  Filled 2019-08-06: qty 2

## 2019-08-06 MED ORDER — MIDAZOLAM HCL 2 MG/2ML IJ SOLN
INTRAMUSCULAR | Status: DC | PRN
  Administered 2019-08-06 (×2): 1 mg via INTRAVENOUS
  Administered 2019-08-06: 0.5 mg via INTRAVENOUS
  Administered 2019-08-06: 1 mg via INTRAVENOUS

## 2019-08-06 MED ORDER — METHYLPREDNISOLONE SODIUM SUCC 125 MG IJ SOLR: 125.0000 mg | Freq: Once | INTRAMUSCULAR | Status: DC | PRN

## 2019-08-06 MED ORDER — ONDANSETRON HCL 4 MG/2ML IJ SOLN
INTRAMUSCULAR | Status: AC
  Administered 2019-08-06: 4 mg via INTRAVENOUS
  Filled 2019-08-06: qty 2

## 2019-08-06 MED ORDER — MIDAZOLAM HCL 2 MG/ML PO SYRP
8.0000 mg | ORAL_SOLUTION | Freq: Once | ORAL | Status: AC | PRN
  Administered 2019-08-06: 12:00:00 8 mg via ORAL

## 2019-08-06 MED ORDER — IODIXANOL 320 MG/ML IV SOLN
INTRAVENOUS | Status: DC | PRN
  Administered 2019-08-06: 5 mL via INTRAVENOUS

## 2019-08-06 MED ORDER — MIDAZOLAM HCL 2 MG/ML PO SYRP
ORAL_SOLUTION | ORAL | Status: AC
  Administered 2019-08-06: 8 mg via ORAL
  Filled 2019-08-06: qty 4

## 2019-08-06 MED ORDER — CLINDAMYCIN PHOSPHATE 300 MG/50ML IV SOLN
INTRAVENOUS | Status: AC
  Filled 2019-08-06: qty 50

## 2019-08-06 MED ORDER — FLUDEOXYGLUCOSE F - 18 (FDG) INJECTION
7.5000 | Freq: Once | INTRAVENOUS | Status: AC | PRN
  Administered 2019-08-06: 7.51 via INTRAVENOUS

## 2019-08-06 MED ORDER — FAMOTIDINE 20 MG PO TABS: 40.0000 mg | ORAL_TABLET | Freq: Once | ORAL | Status: DC | PRN

## 2019-08-06 MED ORDER — CLINDAMYCIN PHOSPHATE 300 MG/50ML IV SOLN: 300.0000 mg | Freq: Once | INTRAVENOUS | Status: DC

## 2019-08-06 MED ORDER — FENTANYL CITRATE (PF) 100 MCG/2ML IJ SOLN
INTRAMUSCULAR | Status: AC
  Filled 2019-08-06: qty 4

## 2019-08-06 MED ORDER — HYDROMORPHONE HCL 1 MG/ML IJ SOLN: 1.0000 mg | Freq: Once | INTRAMUSCULAR | Status: DC | PRN

## 2019-08-06 MED ORDER — MIDAZOLAM HCL 5 MG/5ML IJ SOLN
INTRAMUSCULAR | Status: AC
  Filled 2019-08-06: qty 10

## 2019-08-06 SURGICAL SUPPLY — 9 items
DRAPE INCISE IOBAN 66X45 STRL (DRAPES) ×4 IMPLANT
KIT PORT POWER 8FR ISP CVUE (Port) ×2 IMPLANT
NEEDLE ENTRY 21GA 7CM ECHOTIP (NEEDLE) ×2 IMPLANT
PACK ANGIOGRAPHY (CUSTOM PROCEDURE TRAY) ×2 IMPLANT
SET INTRO CAPELLA COAXIAL (SET/KITS/TRAYS/PACK) ×2 IMPLANT
SUT MNCRL AB 4-0 PS2 18 (SUTURE) ×2 IMPLANT
SUT PROLENE 0 CT 1 30 (SUTURE) ×2 IMPLANT
SUT VIC AB 3-0 SH 27 (SUTURE) ×1
SUT VIC AB 3-0 SH 27X BRD (SUTURE) ×1 IMPLANT

## 2019-08-06 NOTE — Progress Notes (Signed)
md to speak to pt. About all results of tests and what kind of chemo she will need.

## 2019-08-06 NOTE — Op Note (Signed)
OPERATIVE NOTE   PROCEDURE: 1. Placement of a left IJ Infuse-a-Port  PRE-OPERATIVE DIAGNOSIS: Advanced right breast carcinoma; central venous stenosis  POST-OPERATIVE DIAGNOSIS: Advanced right breast carcinoma; no evidence for central venous stenosis  SURGEON: Katha Cabal M.D.  ANESTHESIA: Conscious sedation was administered under my direct supervision by the interventional radiology RN. IV Versed plus fentanyl were utilized. Continuous ECG, pulse oximetry and blood pressure was monitored throughout the entire procedure. Conscious sedation was for a total of 30 minutes.  ESTIMATED BLOOD LOSS: Minimal   FINDING(S): 1.  The left proximal internal jugular as well as the left innominate veins are widely patent.  The confluence of the left innominate vein with the superior vena cava is at a very sharp angle and the J-wire under fluoroscopy tends to hang up at this location and required manipulations to keep it from going retrograde up the right innominate.  I believe this is the problem that was encountered yesterday.  SPECIMEN(S): None  INDICATIONS:   Debra Little is a 63 y.o. female who presents with advanced right breast carcinoma.  She therefore requires chemotherapy and consequently appropriate IV access.  Yesterday she underwent attempted port placement in the operating room but they were unable to achieve adequate wire purchase.  Subsequently, I was asked if I would evaluate her central venous system for suspected stricture and move forward with port placement.    DESCRIPTION: After obtaining full informed written consent, the patient was brought back to the special procedure suite and placed in the supine position. The patient's left neck and chest wall are prepped and draped in sterile fashion. Appropriate timeout was called.  Ultrasound is placed in a sterile sleeve, ultrasound is utilized to avoid vascular injury as well as secondary to lack of appropriate landmarks. The  left internal jugular vein is identified. It is echolucent and homogeneous as well as easily compressible indicating patency. An image is recorded for the permanent record.  Access to the vein with a micropuncture needle is done under direct ultrasound visualization.  1% lidocaine is infiltrated into the soft tissue at the base of the neck as well as on the chest wall.  Under direct ultrasound visualization a micro-needle is inserted into the vein followed by the micro-wire. Micro-sheath was then advanced and a J wire is inserted without difficulty under fluoroscopic guidance.  Stopcock was then placed on the micro sheath and hand-injection contrast was utilized to demonstrate the central venous anatomy.  No stricture was identified.  A small counterincision was created at the wire insertion site. A transverse incision is created 2 fingerbreadths below the scapula and a pocket is fashioned using both blunt and sharp dissection. The pocket is tested for appropriate size with the hub of the Infuse-a-Port. The tunneling device is then used to pull the intravascular portion of the catheter from the pocket to the neck counterincision.  Dilator and peel-away sheath were then inserted over the wire and the wire is removed. Catheter is then advanced into the venous system without difficulty. Peel-away sheath was then removed.  Catheter is then positioned under fluoroscopic guidance at the atrial caval junction. It is then transected connected to the hub and the hope is slipped into the subcutaneous pocket on the chest wall. The hub was then accessed percutaneously and aspirates easily and flushes well and is flushed with 30 cc of heparinized saline. The pocket incision is then closed in layers using interrupted 3-0 Vicryl for the subcutaneous tissues and 4-0 Monocryl subcuticular for  skin closure. Dermabond is applied. The neck counterincision was closed with 4-0 Monocryl subcuticular and Dermabond as well.  The  patient tolerated the procedure well and there were no immediate complications.  COMPLICATIONS: None  CONDITION: Unchanged  Katha Cabal M.D. [facility] Office: [phone]   08/06/2019, 5:58 PM

## 2019-08-06 NOTE — Telephone Encounter (Signed)
Called pt and went over the bx for breast in [place].  Date of MRI breast bx is 9/18 arrive at [facility] imaging at 7 am. Light breakfast that am, wear mask, no visitors with pt. Address: [address] [place].  Telephone [phone] Then after that she needs to go to [facility]. [employer]. 4th floor for a light mammogram.  Telephone [phone]. Pt understands above and will cal if needed.

## 2019-08-06 NOTE — H&P (Signed)
[facility] History & Physical Update  The patient was interviewed and re-examined.  The patient's previous History and Physical has been reviewed and is unchanged.  There is no change in the plan of care. We plan to proceed with the scheduled procedure.  [name], MD  08/06/2019, 2:58 PM

## 2019-08-07 ENCOUNTER — Encounter: Payer: Self-pay | Admitting: Vascular Surgery

## 2019-08-07 ENCOUNTER — Other Ambulatory Visit: Payer: Self-pay

## 2019-08-07 DIAGNOSIS — Z7189 Other specified counseling: Secondary | ICD-10-CM | POA: Insufficient documentation

## 2019-08-07 DIAGNOSIS — I341 Nonrheumatic mitral (valve) prolapse: Secondary | ICD-10-CM | POA: Diagnosis not present

## 2019-08-07 DIAGNOSIS — C50411 Malignant neoplasm of upper-outer quadrant of right female breast: Secondary | ICD-10-CM | POA: Diagnosis not present

## 2019-08-07 DIAGNOSIS — R002 Palpitations: Secondary | ICD-10-CM | POA: Diagnosis not present

## 2019-08-07 DIAGNOSIS — R0602 Shortness of breath: Secondary | ICD-10-CM | POA: Diagnosis not present

## 2019-08-07 DIAGNOSIS — E782 Mixed hyperlipidemia: Secondary | ICD-10-CM | POA: Diagnosis not present

## 2019-08-07 DIAGNOSIS — K219 Gastro-esophageal reflux disease without esophagitis: Secondary | ICD-10-CM | POA: Diagnosis not present

## 2019-08-07 MED ORDER — DEXAMETHASONE 4 MG PO TABS: ORAL_TABLET | ORAL | 1 refills | Status: DC

## 2019-08-07 MED ORDER — LORAZEPAM 0.5 MG PO TABS: 0.5000 mg | ORAL_TABLET | Freq: Four times a day (QID) | ORAL | 0 refills | Status: DC | PRN

## 2019-08-07 MED ORDER — PROCHLORPERAZINE MALEATE 10 MG PO TABS: 10.0000 mg | ORAL_TABLET | Freq: Four times a day (QID) | ORAL | 1 refills | Status: DC | PRN

## 2019-08-07 MED ORDER — ONDANSETRON HCL 8 MG PO TABS: 8.0000 mg | ORAL_TABLET | Freq: Two times a day (BID) | ORAL | 1 refills | Status: DC | PRN

## 2019-08-07 MED ORDER — LIDOCAINE-PRILOCAINE 2.5-2.5 % EX CREA: TOPICAL_CREAM | CUTANEOUS | 3 refills | Status: DC

## 2019-08-07 NOTE — Progress Notes (Signed)
I connected with Debra Little on 08/07/19 at  3:00 PM EDT by video enabled telemedicine visit and verified that I am speaking with the correct person using two identifiers.   I discussed the limitations, risks, security and privacy concerns of performing an evaluation and management service by telemedicine and the availability of in-person appointments. I also discussed with the patient that there may be a patient responsible charge related to this service. The patient expressed understanding and agreed to proceed.  Other persons participating in the visit and their role in the encounter: none  Patient's location:  home Provider's location:  work  Risk analyst Complaint: Discuss final pathology results and further management  History of present illness: Patient is a 63 year old female with seen Dr. Tollie Pizza in the past and has undergone breast biopsies which did not previously showed malignancy.  More recently patient noted some red discoloration around her right perioral as well as a palpable mass which led to a diagnostic mammogram on the right side her prior mammogram in February 2020 was normal in August 2020 she was noted to have a 2.7 x 2 x 2.3 cm mass in her right breast along with 4 morphologically abnormal lymph nodes.  She underwent breast biopsy as well as lymph node biopsy which showed grade 3 invasive mammary carcinoma.  ER PR and HER-2/neu negative.  Sections show high-grade invasive carcinoma with focal squamous differentiation and pinpoint keratin ideation.  The features are concerning for metaplastic differentiation.[name]  Lymphovascular invasion present.  There was extranodal extension present on the lymph node biopsy.   Her family history is significant for breast cancer in her mother in her 3s.  Father had colon cancer in his 47s.  Family history also significant for ovarian cancer in her maternal aunt.    MRI of bilateral breasts showed:  Primary right breast mass was 3.5 x 2.7 x 3.4  cm.  There were surrounding numerous nodules consistent with satellite lesions.  Extensive nodular non-mass enhancement throughout the right breast involving the upper and lower quadrants spanning 6.2 x 4.5 x 5.5 cm.  Markedly enhancement of the left breast diffusely.  Non-mass enhancement measures 1.9 x 2.4 cm.  At least 3 abnormal lymph nodes in the right axilla.  No abnormal left axillary lymph nodes.    PET CT scan showed hypermetabolic possible satellite nodule just cephalad to the right breast lesion.  Equivocal inferior breast and left axillary nodal hypermetabolism.  No evidence of extrathoracic hypermetabolic metastases.  Lateral right breast primary with right axillary nodal metastases.   Interval history: Patient just had a PET CT scan today as well as port placement.  Overall she feels anxious and reports soreness in her right breast for the general complaints.   Review of Systems  Constitutional: Positive for malaise/fatigue. Negative for chills, fever and weight loss.  HENT: Negative for congestion, ear discharge and nosebleeds.   Eyes: Negative for blurred vision.  Respiratory: Negative for cough, hemoptysis, sputum production, shortness of breath and wheezing.   Cardiovascular: Negative for chest pain, palpitations, orthopnea and claudication.  Gastrointestinal: Negative for abdominal pain, blood in stool, constipation, diarrhea, heartburn, melena, nausea and vomiting.  Genitourinary: Negative for dysuria, flank pain, frequency, hematuria and urgency.  Musculoskeletal: Negative for back pain, joint pain and myalgias.  Skin: Negative for rash.  Neurological: Negative for dizziness, tingling, focal weakness, seizures, weakness and headaches.  Endo/Heme/Allergies: Does not bruise/bleed easily.  Psychiatric/Behavioral: Negative for depression and suicidal ideas. The patient does not have insomnia.  Allergies  Allergen Reactions   Amitiza [Lubiprostone] Other (See Comments)     Overly effective at higher dose   Bentyl [Dicyclomine Hcl]     Lack of effect   Buspar [Buspirone] Other (See Comments)    Palpitations.    Celecoxib     REACTION: unspecified   Cortisone Other (See Comments)    flush   Doxycycline     Palpitations and chest pain   Mobic [Meloxicam]     Palpitations.  But can tolerate aleve   Nitrofurantoin Monohyd Macro    Penicillins     REACTION: rash   Sulfonamide Derivatives     REACTION: nausea   Zoloft [Sertraline Hcl]     nausea and diarrhea.       Past Medical History:  Diagnosis Date   Anxiety    sees Dr. [name]   Breast cancer [facility]) 2020   Cyst of breast    per [name]   Fibromyalgia    GERD (gastroesophageal reflux disease)    Heart murmur    Hemorrhoids    Herpes, genital 04/2015   confirmed with HSV 2 IgG   Hiatal hernia    Hypercholesterolemia    Dr. Kyra Searles   Hyperlipidemia    IBS (irritable bowel syndrome)    constipation predominant   IC (interstitial cystitis)    per [facility]   Mild depression (HCC)    MVP (mitral valve prolapse)    [facility] cards eval 2012   Osteopenia    2010/2017, DEXA at [facility];spine and fem neck   Vitamin D deficiency    history of    Past Surgical History:  Procedure Laterality Date   ABDOMINAL HYSTERECTOMY     APPENDECTOMY     BLADDER SURGERY     1980's   BREAST EXCISIONAL BIOPSY Right    benign   BREAST EXCISIONAL BIOPSY Bilateral    benign   COLONOSCOPY  09/2014   Dr. [name]   COLONOSCOPY  2009   at [facility] 1 POLYP (BENIGN)   excision of breast cysts     hx of multiple cyst aspirations   EXPLORATORY LAPAROTOMY     PORTA CATH INSERTION N/A 08/06/2019   Procedure: PORTA CATH INSERTION;  Surgeon: Katha Cabal, MD;  Location: [facility];  Service: Cardiovascular;  Laterality: N/A;   PORTACATH PLACEMENT Left 08/05/2019   Procedure: INSERTION PORT-A-CATH, Attempted;  Surgeon: Jules Husbands, MD;  Location: [facility];   Service: General;  Laterality: Left;   TUBAL LIGATION      Social History   Socioeconomic History   Marital status: Divorced    Spouse name: Not on file   Number of children: 1   Years of education: Not on file   Highest education level: Not on file  Occupational History   Occupation: [employer] resource strain: Not on file   Food insecurity    Worry: Not on file    Inability: Not on file   Transportation needs    Medical: Not on file    Non-medical: Not on file  Tobacco Use   Smoking status: Never Smoker   Smokeless tobacco: Never Used  Substance and Sexual Activity   Alcohol use: No    Alcohol/week: 0.0 standard drinks   Drug use: No   Sexual activity: Yes    Birth control/protection: Surgical    Comment: Hysterectomy  Lifestyle   Physical activity    Days per week: Not on file  Minutes per session: Not on file   Stress: Not on file  Relationships   Social connections    Talks on phone: Not on file    Gets together: Not on file    Attends religious service: Not on file    Active member of club or organization: Not on file    Attends meetings of clubs or organizations: Not on file    Relationship status: Not on file   Intimate partner violence    Fear of current or ex partner: Not on file    Emotionally abused: Not on file    Physically abused: Not on file    Forced sexual activity: Not on file  Other Topics Concern   Not on file  Social History Narrative   Married in 02-20-1997, divorced as of 02/20/17, 1 son from previous relationship   Brother with MVA at 4, in rest home for many years as of 2018/02/20- died of covid recently    Family History  Problem Relation Age of Onset   Breast cancer Mother 71   Hypertension Mother    Colon cancer Father 60   Hyperlipidemia Father    Ovarian cancer Maternal Aunt 80   Breast cancer Cousin 68   Gallbladder disease Paternal Grandmother    Liver cancer Cousin 44        Malignant     Current Outpatient Medications:    acetaminophen (TYLENOL) 500 MG tablet, Take 1,000 mg by mouth every 6 (six) hours as needed for moderate pain., Disp: , Rfl:    atorvastatin (LIPITOR) 40 MG tablet, Take 40 mg by mouth daily., Disp: , Rfl: 1   Cholecalciferol (VITAMIN D) 125 MCG (5000 UT) CAPS, Take 5,000 Units by mouth daily. , Disp: , Rfl:    clonazePAM (KLONOPIN) 0.5 MG tablet, Take 0.5 mg by mouth 2 (two) times daily. , Disp: , Rfl:    DULoxetine (CYMBALTA) 60 MG capsule, Take 60 mg by mouth daily. , Disp: , Rfl:    gabapentin (NEURONTIN) 100 MG capsule, Take 1-2 capsules (100-200 mg total) by mouth 3 (three) times daily as needed. (Patient taking differently: Take 100 mg by mouth at bedtime. ), Disp: 90 capsule, Rfl: 1   HYDROcodone-acetaminophen (NORCO/VICODIN) 5-325 MG tablet, Take 1 tablet by mouth every 6 (six) hours as needed for moderate pain., Disp: 15 tablet, Rfl: 0   loratadine (CLARITIN) 10 MG tablet, Take 1 tablet (10 mg total) by mouth daily., Disp: , Rfl:    omeprazole (PRILOSEC) 20 MG capsule, Take 20 mg by mouth daily. , Disp: , Rfl:    Plecanatide (TRULANCE) 3 MG TABS, Take 3 mg by mouth every other day. , Disp: , Rfl:    sennosides-docusate sodium (SENOKOT-S) 8.6-50 MG tablet, Take 1 tablet by mouth daily. Takes every other day, Disp: , Rfl:    valACYclovir (VALTREX) 500 MG tablet, Take 500 mg by mouth daily., Disp: , Rfl:    vitamin B-12 (CYANOCOBALAMIN) 500 MCG tablet, Take 500 mcg by mouth daily., Disp: , Rfl:    ALPRAZolam (XANAX) 0.5 MG tablet, Take 1 tablet (0.5 mg total) by mouth as needed for anxiety. Take 1-2 tablets as needed prior to your MRI (Patient not taking: Reported on 08/06/2019), Disp: 2 tablet, Rfl: 0   dexamethasone (DECADRON) 4 MG tablet, Take 2 tablets by mouth daily starting the day after Carboplatin and Cytoxan x 3 days. Take with food., Disp: 30 tablet, Rfl: 1   lidocaine-prilocaine (EMLA) cream, Apply to affected area  once, Disp: 30 g, Rfl: 3   LORazepam (ATIVAN) 0.5 MG tablet, Take 1 tablet (0.5 mg total) by mouth every 6 (six) hours as needed (Nausea or vomiting)., Disp: 30 tablet, Rfl: 0   ondansetron (ZOFRAN) 4 MG tablet, Take 1 tablet (4 mg total) by mouth every 8 (eight) hours as needed for nausea or vomiting. (Patient not taking: Reported on 08/06/2019), Disp: 20 tablet, Rfl: 0   ondansetron (ZOFRAN) 8 MG tablet, Take 1 tablet (8 mg total) by mouth 2 (two) times daily as needed. Start on the third day after chemotherapy., Disp: 30 tablet, Rfl: 1   prochlorperazine (COMPAZINE) 10 MG tablet, Take 1 tablet (10 mg total) by mouth every 6 (six) hours as needed (Nausea or vomiting)., Disp: 30 tablet, Rfl: 1  Nm Cardiac Muga Rest  Result Date: 08/01/2019 CLINICAL DATA:  RIGHT breast cancer, pre cardiotoxic chemotherapy EXAM: NUCLEAR MEDICINE CARDIAC BLOOD POOL IMAGING (MUGA) TECHNIQUE: Cardiac multi-gated acquisition was performed at rest following intravenous injection of Tc-91mlabeled red blood cells. RADIOPHARMACEUTICALS:  21.28 mCi [id number] in-vitro labeled red blood cells IV COMPARISON:  None FINDINGS: Calculated LEFT ventricular ejection fraction is 57.2%, normal Study was obtained at a cardiac rate of 66 bpm. Patient was fairly rhythmic during imaging. Cine analysis of the LEFT ventricle in 3 projections demonstrates normal LEFT ventricular wall motion. IMPRESSION: Normal LEFT ventricular ejection fraction of 57.2%. Normal LV wall motion. Electronically Signed   By: MaLavonia Dana.D.   On: 08/01/2019 14:20   Mr Breast Bilateral W Wo Contrast Inc Cad  Result Date: 07/31/2019 CLINICAL DATA:  Recently diagnosed malignancy in the right breast. LABS:  None EXAM: BILATERAL BREAST MRI WITH AND WITHOUT CONTRAST TECHNIQUE: Multiplanar, multisequence MR images of both breasts were obtained prior to and following the intravenous administration of 6 ml of Gadavist Three-dimensional MR images were rendered by  post-processing of the original MR data on an independent workstation. The three-dimensional MR images were interpreted, and findings are reported in the following complete MRI report for this study. Three dimensional images were evaluated at the independent DynaCad workstation COMPARISON:  Previous exam(s). FINDINGS: Breast composition: c. Heterogeneous fibroglandular tissue. Background parenchymal enhancement: Marked Right breast: The patient's known malignancy is identified in the lower outer quadrant of the right breast. Hematoma extends anterior to the malignancy along the biopsy tract. The borders of the known malignancy are somewhat obscured by extensive enhancement in the lateral right breast making exact measurement of the primary tumor difficulty. That being said, the primary malignancy measures at least 3.5 by 2.7 by 3.4 cm in AP, transverse, and craniocaudal dimensions. There is enhancement along the hematoma/biopsy tract extending anteriorly. There are numerous surrounding nodules of enhancement consistent with satellite lesions. There is extensive nodular and non mass enhancement throughout almost the entire lateral right breast involving both the upper and lower outer quadrants. A representative nodule of enhancement in the anterior inferolateral right breast is seen on series 6, image 39 measuring 8 mm. Extensive non mass enhancement is seen in the upper outer right breast such as on series 6, image 62 spanning at least 5.3 cm. The most prominent portion of this non mass enhancement is located posteriorly. Abnormal enhancement extends to midline but not definitely across midline. Counting the primary malignancy and the surrounding enhancement, the maximum anterior diameter is at least 6.2 cm. The maximum transverse diameter is at least 4.5 cm. The maximum craniocaudal dimension is approximately 5.5 cm. Left breast: There is marked background enhancement in  the left breast diffusely. There is an non  mass enhancement in the inferior left breast primarily located at 6 o'clock. This non mass enhancement is well seen on series 6, images 31-37. The maximum dimension is approximately 1.9 cm in AP dimension. On sagittal reconstructed images, the maximum craniocaudal dimension is approximately 2.4 cm. Lymph nodes: At least 3 abnormal lymph nodes are seen in the right axilla. At least 4 abnormal nodes were seen on ultrasound. No abnormal nodes in the left axilla. No abnormal internal mammary nodes identified. Ancillary findings:  None. IMPRESSION: 1. There is extensive enhancement throughout almost the entire lateral right breast. This makes it difficult to accurately measure the recently diagnosed right bright breast cancer. However, the best measurement for the primary mass is 3.5 x 2.7 x 3.4 cm. The extensive enhancement throughout the lateral right breast is composed of both non mass enhancement and multiple nodules. I suspect this all represents malignancy. The anterior inferolateral most extent is seen on series 6, image 39 and the most posterosuperior extent is seen on series 6, image 62 the suspected malignancy extends at least to midline but not definitely across. 2. There is abnormal enhancement in the inferior left breast centered at 6 o'clock. This region of non mass enhancement is irregular and is best seen on series 6, images 31240. The non mass enhancement appears to measure at least 2.4 x 1.9 cm in craniocaudal and AP dimensions. The most conspicuous portion of this non mass enhancement is on series 6, image 35 3. Three abnormal nodes are seen in the right axilla. At least 4 nodes were seen under ultrasound. No left axillary or internal mammary adenopathy. RECOMMENDATION: If breast conservation is being considered, recommend MRI biopsy of the most superior posterior portion of the suspected malignancy seen on series 6, image 62 and the most anterior inferior portion of suspected disease seen on series 6,  image 39. Also, recommend biopsying the abnormal non mass enhancement in the inferior left breast best seen on series 6, image 35. BI-RADS CATEGORY  4: Suspicious. Electronically Signed   By: Dorise Bullion III M.D   On: 07/31/2019 15:49   Nm Pet Image Initial (pi) Skull Base To Thigh  Result Date: 08/06/2019 CLINICAL DATA:  Initial treatment strategy for right-sided breast cancer. Attempted left Port-A-Cath yesterday. EXAM: NUCLEAR MEDICINE PET SKULL BASE TO THIGH TECHNIQUE: 7.5 mCi F-18 FDG was injected intravenously. Full-ring PET imaging was performed from the skull base to thigh after the radiotracer. CT data was obtained and used for attenuation correction and anatomic localization. Fasting blood glucose: 89 mg/dl COMPARISON:  Breast MR of 07/31/2019. FINDINGS: Mediastinal blood pool activity: SUV max 2.3 Liver activity: SUV max NA NECK: No areas of abnormal hypermetabolism. Incidental CT findings: No cervical adenopathy. Presumably procedure related left-sided cervical edema is mild, including on 42/4. 1.0 cm nonspecific anterior left thyroid nodule on 55/4. CHEST: Posterior left axillary node measures 6 mm and a S.U.V. max of 2.6 on 69/4. Right axillary nodes measure maximally 1.0 cm and a S.U.V. max of 6.1 on 75/4. Lateral right breast primary measures on the order of 2.6 cm and a S.U.V. max of 13.7 on image 105/4. Just cephalad to this, a nodule measures 8 mm and a S.U.V. max of 3.4 on 98/4. Inferior left breast hypermetabolism is without well-defined correlate mass. Measures a S.U.V. max of 2.7, including on image 118/4. Incidental CT findings: Left chest wall and anterior mediastinal edema consistent with failed left Port-A-Cath placement. No well-defined  hematoma. Trace right pleural fluid or thickening. Left lower lobe interstitial thickening. ABDOMEN/PELVIS: No abdominopelvic parenchymal or nodal hypermetabolism. Incidental CT findings: Normal adrenal glands. Abdominal aortic atherosclerosis.  Hysterectomy. Mild pelvic floor laxity. SKELETON: No abnormal marrow activity. Incidental CT findings: none IMPRESSION: 1. Hypermetabolic lateral right breast primary with right axillary nodal metastasis. Possible satellite nodule just cephalad to the lateral right breast lesion. 2. Equivocal inferior left breast and left axillary nodal hypermetabolism. Please see MRI report of 07/31/2019. 3. No evidence of extrathoracic hypermetabolic metastasis. 4. Sequelae of failed left Port-A-Cath, as on clinical history. Electronically Signed   By: Abigail Miyamoto M.D.   On: 08/06/2019 12:04   Dg Chest Port 1 View  Result Date: 08/05/2019 CLINICAL DATA:  Attempted Port-A-Cath EXAM: PORTABLE CHEST 1 VIEW COMPARISON:  05/25/2014 FINDINGS: Cardiomegaly. Both lungs are clear. The visualized skeletal structures are unremarkable. IMPRESSION: Cardiomegaly without acute abnormality of the lungs in AP portable projection. No pneumothorax or pleural. No unexpected radiopaque foreign body about the chest following attempted Port-A-Cath placement. Electronically Signed   By: Eddie Candle M.D.   On: 08/05/2019 10:50   Dg C-arm 1-60 Min-no Report  Result Date: 08/05/2019 Fluoroscopy was utilized by the requesting physician.  No radiographic interpretation.   US Breast Ltd Uni Right Inc Axilla  Result Date: 07/17/2019 CLINICAL DATA:  Patient describes a new palpable lump within the outer RIGHT breast. History of bilateral breast cysts. EXAM: DIGITAL DIAGNOSTIC RIGHT MAMMOGRAM WITH CAD AND TOMO ULTRASOUND RIGHT BREAST COMPARISON:  Previous exam(s). ACR Breast Density Category c: The breast tissue is heterogeneously dense, which may obscure small masses. FINDINGS: There are multiple partially obscured masses within the upper-outer quadrant of the RIGHT breast, middle to posterior depth, largest measuring approximately 2.3 cm, corresponding to the area of clinical concern with overlying skin marker in place. There are no new dominant  masses, suspicious calcifications or secondary signs of malignancy elsewhere within the RIGHT breast. Mammographic images were processed with CAD. Targeted ultrasound is performed, showing a nearly anechoic mass with indistinct margins in the RIGHT breast at the 7:30 o'clock axis, 5 cm from the nipple, measuring 2 cm greatest dimension, with surrounding shadowing tissues, the combination of nearly anechoic mass and surrounding shadowing tissues measures 2.7 x 2 x 2.3 cm, corresponding to the mammographic findings. There are at least at least 4 morphologically abnormal lymph nodes, largest with cortical thickness of 6 mm. IMPRESSION: 1. Nearly anechoic mass with indistinct margins in the RIGHT breast at the 7:30 o'clock axis, 5 cm from the nipple, measuring 2 cm, with surrounding shadowing tissues, the combination of mass and surrounding shadowing tissues measuring 2.7 cm, corresponding to the mammographic finding, possibly benign cyst surrounded by dense shadowing tissues. Recommend ultrasound-guided biopsy of the area of shadowing tissues surrounding the suspected cyst. Also recommend subsequent aspiration of the cyst. 2. Ultrasound-guided biopsy of 1 of the morphologically abnormal lymph nodes in the RIGHT axilla. RECOMMENDATION: 1. Core biopsy of the shadowing tissue surrounding the suspected cyst in the RIGHT breast at the 7:30 o'clock axis, 5 cm from nipple. 2. Aspiration of the suspected cyst within the RIGHT breast at the 7:30 o'clock axis, 5 cm from the nipple, measuring approximately 2 cm greatest dimension. 3. Ultrasound-guided core biopsy of 1 of the morphologically abnormal lymph nodes in the RIGHT axilla. Ordering physician will be contacted with today's results and patient will then be scheduled for these ultrasound-guided procedures at her earliest convenience. I have discussed the findings and recommendations with the  patient. Results were also provided in writing at the conclusion of the visit. If  applicable, a reminder letter will be sent to the patient regarding the next appointment. BI-RADS CATEGORY  4: Suspicious. Electronically Signed   By: Franki Cabot M.D.   On: 07/17/2019 16:42   Mm Diag Breast Tomo Uni Right  Result Date: 07/17/2019 CLINICAL DATA:  Patient describes a new palpable lump within the outer RIGHT breast. History of bilateral breast cysts. EXAM: DIGITAL DIAGNOSTIC RIGHT MAMMOGRAM WITH CAD AND TOMO ULTRASOUND RIGHT BREAST COMPARISON:  Previous exam(s). ACR Breast Density Category c: The breast tissue is heterogeneously dense, which may obscure small masses. FINDINGS: There are multiple partially obscured masses within the upper-outer quadrant of the RIGHT breast, middle to posterior depth, largest measuring approximately 2.3 cm, corresponding to the area of clinical concern with overlying skin marker in place. There are no new dominant masses, suspicious calcifications or secondary signs of malignancy elsewhere within the RIGHT breast. Mammographic images were processed with CAD. Targeted ultrasound is performed, showing a nearly anechoic mass with indistinct margins in the RIGHT breast at the 7:30 o'clock axis, 5 cm from the nipple, measuring 2 cm greatest dimension, with surrounding shadowing tissues, the combination of nearly anechoic mass and surrounding shadowing tissues measures 2.7 x 2 x 2.3 cm, corresponding to the mammographic findings. There are at least at least 4 morphologically abnormal lymph nodes, largest with cortical thickness of 6 mm. IMPRESSION: 1. Nearly anechoic mass with indistinct margins in the RIGHT breast at the 7:30 o'clock axis, 5 cm from the nipple, measuring 2 cm, with surrounding shadowing tissues, the combination of mass and surrounding shadowing tissues measuring 2.7 cm, corresponding to the mammographic finding, possibly benign cyst surrounded by dense shadowing tissues. Recommend ultrasound-guided biopsy of the area of shadowing tissues surrounding  the suspected cyst. Also recommend subsequent aspiration of the cyst. 2. Ultrasound-guided biopsy of 1 of the morphologically abnormal lymph nodes in the RIGHT axilla. RECOMMENDATION: 1. Core biopsy of the shadowing tissue surrounding the suspected cyst in the RIGHT breast at the 7:30 o'clock axis, 5 cm from nipple. 2. Aspiration of the suspected cyst within the RIGHT breast at the 7:30 o'clock axis, 5 cm from the nipple, measuring approximately 2 cm greatest dimension. 3. Ultrasound-guided core biopsy of 1 of the morphologically abnormal lymph nodes in the RIGHT axilla. Ordering physician will be contacted with today's results and patient will then be scheduled for these ultrasound-guided procedures at her earliest convenience. I have discussed the findings and recommendations with the patient. Results were also provided in writing at the conclusion of the visit. If applicable, a reminder letter will be sent to the patient regarding the next appointment. BI-RADS CATEGORY  4: Suspicious. Electronically Signed   By: Franki Cabot M.D.   On: 07/17/2019 16:42   Mm Clip Placement Right  Result Date: 07/24/2019 CLINICAL DATA:  Evaluate biopsy marker EXAM: DIAGNOSTIC RIGHT MAMMOGRAM POST ULTRASOUND BIOPSY COMPARISON:  Previous exam(s). FINDINGS: Mammographic images were obtained following ultrasound guided biopsy of a right breast mass. The coil shaped clip is within the biopsied right breast mass. The mass is smaller after biopsy. The HydroMARK clip could not be pulled into view but was clearly seen under ultrasound within the biopsied lymph node. IMPRESSION: Appropriate clip placement within the right breast mass as above. The HydroMARK clip within the biopsied lymph node could not be pulled into view on this mammogram. Final Assessment: Post Procedure Mammograms for Marker Placement Electronically Signed   By:  Dorise Bullion III M.D   On: 07/24/2019 14:29   [place] Rt Breast Bx W Loc Dev 1st Lesion Img Bx Spec US  Guide  Addendum Date: 07/29/2019   ADDENDUM REPORT: 07/29/2019 12:36 ADDENDUM: PATHOLOGY revealed: RIGHT breast mass at 7:30 o'clock, 5 cm from nipple- Invasive Mammary Carcinoma, with focal squamous differentiation. RIGHT axillary node- positive for metastatic carcinoma; measuring at least 7.5 mm in greatest extent, with extranodal extension. Pathology results are CONCORDANT with imaging findings, per Dr. Dorise Bullion. Pathology results were discussed with patient via telephone. The patient reported doing well after the biopsy with tenderness at the site. Post biopsy care instructions were reviewed and questions were answered. The patient was encouraged to call [facility] for any additional concerns. Recommendation: Surgical referral. Request for surgical referral was relayed to [facility] navig[facility] by Electa Sniff RN on 07/28/2019. Addendum by Electa Sniff RN on 07/29/2019. Electronically Signed   By: Dorise Bullion III M.D   On: 07/29/2019 12:36   Result Date: 07/29/2019 CLINICAL DATA:  Biopsy right breast mass and abnormal right axillary node. EXAM: ULTRASOUND GUIDED RIGHT BREAST CORE NEEDLE BIOPSY COMPARISON:  Previous exam(s). FINDINGS: I met with the patient and we discussed the procedure of ultrasound-guided biopsy, including benefits and alternatives. We discussed the high likelihood of a successful procedure. We discussed the risks of the procedure, including infection, bleeding, tissue injury, clip migration, and inadequate sampling. Informed written consent was given. The usual time-out protocol was performed immediately prior to the procedure. Lesion quadrant: Lower-outer Using sterile technique and 1% Lidocaine as local anesthetic, under direct ultrasound visualization, a 12 gauge spring-loaded device was used to perform biopsy of the 730 right breast mass using a medial approach. At the conclusion of the procedure a coil shaped tissue marker clip was deployed  into the biopsy cavity. Follow up 2 view mammogram was performed and dictated separately. Lesion quadrant: Right axilla Using sterile technique and 1% Lidocaine as local anesthetic, under direct ultrasound visualization, a 14 gauge spring-loaded device was used to perform biopsy of a right axillary lymph node using a lateral approach. At the conclusion of the procedure a HydroMARK tissue marker clip was deployed into the biopsy cavity. Follow up 2 view mammogram was performed and dictated separately. IMPRESSION: Ultrasound guided biopsy of a right breast mass and a right axillary lymph node. No apparent complications. Electronically Signed: By: Dorise Bullion III M.D On: 07/24/2019 14:22   [place] Rt Breast Bx W Loc Dev Ea Add Lesion Img Bx Spec US Guide  Addendum Date: 07/29/2019   ADDENDUM REPORT: 07/29/2019 12:36 ADDENDUM: PATHOLOGY revealed: RIGHT breast mass at 7:30 o'clock, 5 cm from nipple- Invasive Mammary Carcinoma, with focal squamous differentiation. RIGHT axillary node- positive for metastatic carcinoma; measuring at least 7.5 mm in greatest extent, with extranodal extension. Pathology results are CONCORDANT with imaging findings, per Dr. Dorise Bullion. Pathology results were discussed with patient via telephone. The patient reported doing well after the biopsy with tenderness at the site. Post biopsy care instructions were reviewed and questions were answered. The patient was encouraged to call [facility] for any additional concerns. Recommendation: Surgical referral. Request for surgical referral was relayed to nurse navigators at [facility] by Electa Sniff RN on 07/28/2019. Addendum by Electa Sniff RN on 07/29/2019. Electronically Signed   By: Dorise Bullion III M.D   On: 07/29/2019 12:36   Result Date: 07/29/2019 CLINICAL DATA:  Biopsy right breast mass and  abnormal right axillary node. EXAM: ULTRASOUND GUIDED RIGHT BREAST CORE NEEDLE BIOPSY COMPARISON:  Previous exam(s).  FINDINGS: I met with the patient and we discussed the procedure of ultrasound-guided biopsy, including benefits and alternatives. We discussed the high likelihood of a successful procedure. We discussed the risks of the procedure, including infection, bleeding, tissue injury, clip migration, and inadequate sampling. Informed written consent was given. The usual time-out protocol was performed immediately prior to the procedure. Lesion quadrant: Lower-outer Using sterile technique and 1% Lidocaine as local anesthetic, under direct ultrasound visualization, a 12 gauge spring-loaded device was used to perform biopsy of the 730 right breast mass using a medial approach. At the conclusion of the procedure a coil shaped tissue marker clip was deployed into the biopsy cavity. Follow up 2 view mammogram was performed and dictated separately. Lesion quadrant: Right axilla Using sterile technique and 1% Lidocaine as local anesthetic, under direct ultrasound visualization, a 14 gauge spring-loaded device was used to perform biopsy of a right axillary lymph node using a lateral approach. At the conclusion of the procedure a HydroMARK tissue marker clip was deployed into the biopsy cavity. Follow up 2 view mammogram was performed and dictated separately. IMPRESSION: Ultrasound guided biopsy of a right breast mass and a right axillary lymph node. No apparent complications. Electronically Signed: By: Dorise Bullion III M.D On: 07/24/2019 14:22        CMP Latest Ref Rng & Units 07/08/2019  Glucose 70 - 99 mg/dL 98  BUN 6 - 23 mg/dL 15  Creatinine 0.40 - 1.20 mg/dL 0.85  Sodium 135 - 145 mEq/L 142  Potassium 3.5 - 5.1 mEq/L 4.1  Chloride 96 - 112 mEq/L 104  CO2 19 - 32 mEq/L 33(H)  Calcium 8.4 - 10.5 mg/dL 9.5  Total Protein 6.0 - 8.3 g/dL 7.0  Total Bilirubin 0.2 - 1.2 mg/dL 0.5  Alkaline Phos 39 - 117 U/L 71  AST 0 - 37 U/L 18  ALT 0 - 35 U/L 15   CBC Latest Ref Rng & Units 06/25/2018  WBC 4.0 - 10.5 K/uL 8.9   Hemoglobin 12.0 - 15.0 g/dL 12.9  Hematocrit 36.0 - 46.0 % 37.6  Platelets 150.0 - 400.0 K/uL 247.0     Observation/objective: Appears in no acute distress of a video visit today.  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  Fortunately PET scan does not show any evidence of distant metastatic disease.  Patient had a normal mammogram in February 2020 and now has a rapidly growing right breast mass which is at least 3.5 cm in its primary size but there are areas of non-mass enhancement around.  Spanning up to 6.2 cm as well as numerous satellite nodules around it.  There was also evidence of 3 abnormal lymph nodes noted on MRI and for an ultrasound which was biopsy-proven invasive mammary carcinoma as well.  MRI also shows non-mass enhancement in her left breast with no evidence of left axillary adenopathy.  Given that she has triple negative breast cancer I do recommend neoadjuvant chemotherapy for her as outlined below.  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  Metaplastic breast cancers are often not responsive to chemotherapy.  I  am planning to proceed with neoadjuvant chemotherapy cautiously and if I cannot see any significant tumor shrinkage after 1-2 cycles of chemotherapy-I will likely send her for upfront surgery.  Given locally advanced triple negative right breast cancer I would recommend neoadjuvant AC along with Keytruda per keynote 522 trial given every 3 weeks with on pro-Neulasta support for 4 cycles followed by [facility] every 3 weeks for 4 cycles and weekly carbotaxol for 12 cycles.  Patient in the keynote 522 trial who received neoadjuvant Keytruda along with  chemotherapy had a higher likelihood of obtaining a pathological complete response especially in stage III disease.  This patient also went on to receive 9 cycles of adjuvant Keytruda.   If clinically she continues to respond well I will plan to give all her neoadjuvant chemotherapy and immunotherapy prior to surgery.  I will obtain an interim ultrasound after 4 cycles of before meals and Keytruda as well.  Patient has already had port placement today and will be seeing [name] tomorrow.  She does have a history of mitral valve prolapse but her baseline echocardiogram shows a normal EF.  And waiting for recommendations from Dr. [name] to see if she would need prophylactic beta-blockers prior to giving her anthracycline-based chemotherapy  With regards to side effects of chemotherapy discussed risks and benefits of chemotherapy including all but not limited to nausea, vomiting, low blood counts, risk of infections and hospitalization.  Risk of cardiotoxicity associated with anthracycline.  Discussed risks and benefits of Keytruda including all but not limited to autoimmune side effect such as colitis, thyroiditis, pneumonitis, hepatitis dermatitis as well as need to monitor kidney functions.  Treatment will be given with the potential curative intent.  Patient understands and agrees to proceed as planned.  If her insurance approves chemotherapy I will plan to start treatment on 08/11/2019.  Patient will also need MRI guided biopsy of the non-mass enhancement of the left breast which will be next week but I will not be waiting for those biopsy results before I proceed with neoadjuvant chemotherapy.  Genetic referral has also been sent out given triple negative nature of her disease as well as her family history  Cancer Staging Breast cancer [facility]) Staging form: Breast, AJCC 8th Edition - Clinical stage from 08/06/2019: Stage IIIB (cT2, cN1, cM0, G3, ER-, PR-, HER2-) - Signed by Sindy Guadeloupe, MD on  08/07/2019     Follow-up instructions: Return to clinic on 08/11/2019 to start first cycle of AC and Keytruda chemotherapy  I discussed the assessment and treatment plan with the patient. The patient was provided an opportunity to ask questions and all were answered. The patient agreed with the plan and demonstrated an understanding of the instructions.   The patient was advised to call back or seek an in-person evaluation if the symptoms worsen or if the condition fails to improve as anticipated.    Visit Diagnosis: 1. Malignant neoplasm of lower-outer quadrant of right breast of female, estrogen receptor negative ([place])   2. Goals of care, counseling/discussion     Dr. Randa Evens, MD, MPH [facility] at [facility] [phone] 08/07/2019 2:10 PM

## 2019-08-07 NOTE — Patient Instructions (Signed)
Pembrolizumab injection What is this medicine? PEMBROLIZUMAB (pem broe liz ue mab) is a monoclonal antibody. It is used to treat bladder cancer, cervical cancer, endometrial cancer, esophageal cancer, head and neck cancer, hepatocellular cancer, Hodgkin lymphoma, kidney cancer, lymphoma, melanoma, Merkel cell carcinoma, lung cancer, stomach cancer, urothelial cancer, and cancers that have a certain genetic condition. This medicine may be used for other purposes; ask your health care provider or pharmacist if you have questions. COMMON BRAND NAME(S): Keytruda What should I tell my health care provider before I take this medicine? They need to know if you have any of these conditions:  diabetes  immune system problems  inflammatory bowel disease  liver disease  lung or breathing disease  lupus  received or scheduled to receive an organ transplant or a stem-cell transplant that uses donor stem cells  an unusual or allergic reaction to pembrolizumab, other medicines, foods, dyes, or preservatives  pregnant or trying to get pregnant  breast-feeding How should I use this medicine? This medicine is for infusion into a vein. It is given by a health care professional in a hospital or clinic setting. A special MedGuide will be given to you before each treatment. Be sure to read this information carefully each time. Talk to your pediatrician regarding the use of this medicine in children. While this drug may be prescribed for selected conditions, precautions do apply. Overdosage: If you think you have taken too much of this medicine contact a poison control center or emergency room at once. NOTE: This medicine is only for you. Do not share this medicine with others. What if I miss a dose? It is important not to miss your dose. Call your doctor or health care professional if you are unable to keep an appointment. What may interact with this medicine? Interactions have not been studied. Give  your health care provider a list of all the medicines, herbs, non-prescription drugs, or dietary supplements you use. Also tell them if you smoke, drink alcohol, or use illegal drugs. Some items may interact with your medicine. This list may not describe all possible interactions. Give your health care provider a list of all the medicines, herbs, non-prescription drugs, or dietary supplements you use. Also tell them if you smoke, drink alcohol, or use illegal drugs. Some items may interact with your medicine. What should I watch for while using this medicine? Your condition will be monitored carefully while you are receiving this medicine. You may need blood work done while you are taking this medicine. Do not become pregnant while taking this medicine or for 4 months after stopping it. Women should inform their doctor if they wish to become pregnant or think they might be pregnant. There is a potential for serious side effects to an unborn child. Talk to your health care professional or pharmacist for more information. Do not breast-feed an infant while taking this medicine or for 4 months after the last dose. What side effects may I notice from receiving this medicine? Side effects that you should report to your doctor or health care professional as soon as possible:  allergic reactions like skin rash, itching or hives, swelling of the face, lips, or tongue  bloody or black, tarry  breathing problems  changes in vision  chest pain  chills  confusion  constipation  cough  diarrhea  dizziness or feeling faint or lightheaded  fast or irregular heartbeat  fever  flushing  hair loss  joint pain  low blood counts - this   medicine may decrease the number of white blood cells, red blood cells and platelets. You may be at increased risk for infections and bleeding.  muscle pain  muscle weakness  persistent headache  redness, blistering, peeling or loosening of the skin,  including inside the mouth  signs and symptoms of high blood sugar such as dizziness; dry mouth; dry skin; fruity breath; nausea; stomach pain; increased hunger or thirst; increased urination  signs and symptoms of kidney injury like trouble passing urine or change in the amount of urine  signs and symptoms of liver injury like dark urine, light-colored stools, loss of appetite, nausea, right upper belly pain, yellowing of the eyes or skin  sweating  swollen lymph nodes  weight loss Side effects that usually do not require medical attention (report to your doctor or health care professional if they continue or are bothersome):  decreased appetite  muscle pain  tiredness This list may not describe all possible side effects. Call your doctor for medical advice about side effects. You may report side effects to FDA at 1-800-FDA-1088. Where should I keep my medicine? This drug is given in a hospital or clinic and will not be stored at home. NOTE: This sheet is a summary. It may not cover all possible information. If you have questions about this medicine, talk to your doctor, pharmacist, or health care provider.  2020 Elsevier/Gold Standard (2018-12-10 13:46:58) Doxorubicin injection What is this medicine? DOXORUBICIN (dox oh ROO bi sin) is a chemotherapy drug. It is used to treat many kinds of cancer like leukemia, lymphoma, neuroblastoma, sarcoma, and Wilms' tumor. It is also used to treat bladder cancer, breast cancer, lung cancer, ovarian cancer, stomach cancer, and thyroid cancer. This medicine may be used for other purposes; ask your health care provider or pharmacist if you have questions. COMMON BRAND NAME(S): Adriamycin, Adriamycin PFS, Adriamycin RDF, Rubex What should I tell my health care provider before I take this medicine? They need to know if you have any of these conditions:  heart disease  history of low blood counts caused by a medicine  liver disease  recent or  ongoing radiation therapy  an unusual or allergic reaction to doxorubicin, other chemotherapy agents, other medicines, foods, dyes, or preservatives  pregnant or trying to get pregnant  breast-feeding How should I use this medicine? This drug is given as an infusion into a vein. It is administered in a hospital or clinic by a specially trained health care professional. If you have pain, swelling, burning or any unusual feeling around the site of your injection, tell your health care professional right away. Talk to your pediatrician regarding the use of this medicine in children. Special care may be needed. Overdosage: If you think you have taken too much of this medicine contact a poison control center or emergency room at once. NOTE: This medicine is only for you. Do not share this medicine with others. What if I miss a dose? It is important not to miss your dose. Call your doctor or health care professional if you are unable to keep an appointment. What may interact with this medicine? This medicine may interact with the following medications:  6-mercaptopurine  paclitaxel  phenytoin  St. John's Wort  trastuzumab  verapamil This list may not describe all possible interactions. Give your health care provider a list of all the medicines, herbs, non-prescription drugs, or dietary supplements you use. Also tell them if you smoke, drink alcohol, or use illegal drugs. Some items may  interact with your medicine. What should I watch for while using this medicine? This drug may make you feel generally unwell. This is not uncommon, as chemotherapy can affect healthy cells as well as cancer cells. Report any side effects. Continue your course of treatment even though you feel ill unless your doctor tells you to stop. There is a maximum amount of this medicine you should receive throughout your life. The amount depends on the medical condition being treated and your overall health. Your doctor  will watch how much of this medicine you receive in your lifetime. Tell your doctor if you have taken this medicine before. You may need blood work done while you are taking this medicine. Your urine may turn red for a few days after your dose. This is not blood. If your urine is dark or brown, call your doctor. In some cases, you may be given additional medicines to help with side effects. Follow all directions for their use. Call your doctor or health care professional for advice if you get a fever, chills or sore throat, or other symptoms of a cold or flu. Do not treat yourself. This drug decreases your body's ability to fight infections. Try to avoid being around people who are sick. This medicine may increase your risk to bruise or bleed. Call your doctor or health care professional if you notice any unusual bleeding. Talk to your doctor about your risk of cancer. You may be more at risk for certain types of cancers if you take this medicine. Do not become pregnant while taking this medicine or for 6 months after stopping it. Women should inform their doctor if they wish to become pregnant or think they might be pregnant. Men should not father a child while taking this medicine and for 6 months after stopping it. There is a potential for serious side effects to an unborn child. Talk to your health care professional or pharmacist for more information. Do not breast-feed an infant while taking this medicine. This medicine has caused ovarian failure in some women and reduced sperm counts in some men This medicine may interfere with the ability to have a child. Talk with your doctor or health care professional if you are concerned about your fertility. This medicine may cause a decrease in Co-Enzyme Q-10. You should make sure that you get enough Co-Enzyme Q-10 while you are taking this medicine. Discuss the foods you eat and the vitamins you take with your health care professional. What side effects may I  notice from receiving this medicine? Side effects that you should report to your doctor or health care professional as soon as possible:  allergic reactions like skin rash, itching or hives, swelling of the face, lips, or tongue  breathing problems  chest pain  fast or irregular heartbeat  low blood counts - this medicine may decrease the number of white blood cells, red blood cells and platelets. You may be at increased risk for infections and bleeding.  pain, redness, or irritation at site where injected  signs of infection - fever or chills, cough, sore throat, pain or difficulty passing urine  signs of decreased platelets or bleeding - bruising, pinpoint red spots on the skin, black, tarry stools, blood in the urine  swelling of the ankles, feet, hands  tiredness  weakness Side effects that usually do not require medical attention (report to your doctor or health care professional if they continue or are bothersome):  diarrhea  hair loss  mouth sores  nail discoloration or damage  nausea  red colored urine  vomiting This list may not describe all possible side effects. Call your doctor for medical advice about side effects. You may report side effects to FDA at 1-800-FDA-1088. Where should I keep my medicine? This drug is given in a hospital or clinic and will not be stored at home. NOTE: This sheet is a summary. It may not cover all possible information. If you have questions about this medicine, talk to your doctor, pharmacist, or health care provider.  2020 Elsevier/Gold Standard (2017-06-27 11:01:26) Cyclophosphamide injection What is this medicine? CYCLOPHOSPHAMIDE (sye kloe FOSS fa mide) is a chemotherapy drug. It slows the growth of cancer cells. This medicine is used to treat many types of cancer like lymphoma, myeloma, leukemia, breast cancer, and ovarian cancer, to name a few. This medicine may be used for other purposes; ask your health care provider or  pharmacist if you have questions. COMMON BRAND NAME(S): Cytoxan, Neosar What should I tell my health care provider before I take this medicine? They need to know if you have any of these conditions:  blood disorders  history of other chemotherapy  infection  kidney disease  liver disease  recent or ongoing radiation therapy  tumors in the bone marrow  an unusual or allergic reaction to cyclophosphamide, other chemotherapy, other medicines, foods, dyes, or preservatives  pregnant or trying to get pregnant  breast-feeding How should I use this medicine? This drug is usually given as an injection into a vein or muscle or by infusion into a vein. It is administered in a hospital or clinic by a specially trained health care professional. Talk to your pediatrician regarding the use of this medicine in children. Special care may be needed. Overdosage: If you think you have taken too much of this medicine contact a poison control center or emergency room at once. NOTE: This medicine is only for you. Do not share this medicine with others. What if I miss a dose? It is important not to miss your dose. Call your doctor or health care professional if you are unable to keep an appointment. What may interact with this medicine? This medicine may interact with the following medications:  amiodarone  amphotericin B  azathioprine  certain antiviral medicines for HIV or AIDS such as protease inhibitors (e.g., indinavir, ritonavir) and zidovudine  certain blood pressure medications such as benazepril, captopril, enalapril, fosinopril, lisinopril, moexipril, monopril, perindopril, quinapril, ramipril, trandolapril  certain cancer medications such as anthracyclines (e.g., daunorubicin, doxorubicin), busulfan, cytarabine, paclitaxel, pentostatin, tamoxifen, trastuzumab  certain diuretics such as chlorothiazide, chlorthalidone, hydrochlorothiazide, indapamide, metolazone  certain medicines  that treat or prevent blood clots like warfarin  certain muscle relaxants such as succinylcholine  cyclosporine  etanercept  indomethacin  medicines to increase blood counts like filgrastim, pegfilgrastim, sargramostim  medicines used as general anesthesia  metronidazole  natalizumab This list may not describe all possible interactions. Give your health care provider a list of all the medicines, herbs, non-prescription drugs, or dietary supplements you use. Also tell them if you smoke, drink alcohol, or use illegal drugs. Some items may interact with your medicine. What should I watch for while using this medicine? Visit your doctor for checks on your progress. This drug may make you feel generally unwell. This is not uncommon, as chemotherapy can affect healthy cells as well as cancer cells. Report any side effects. Continue your course of treatment even though you feel ill unless your doctor tells you to stop.  Drink water or other fluids as directed. Urinate often, even at night. In some cases, you may be given additional medicines to help with side effects. Follow all directions for their use. Call your doctor or health care professional for advice if you get a fever, chills or sore throat, or other symptoms of a cold or flu. Do not treat yourself. This drug decreases your body's ability to fight infections. Try to avoid being around people who are sick. This medicine may increase your risk to bruise or bleed. Call your doctor or health care professional if you notice any unusual bleeding. Be careful brushing and flossing your teeth or using a toothpick because you may get an infection or bleed more easily. If you have any dental work done, tell your dentist you are receiving this medicine. You may get drowsy or dizzy. Do not drive, use machinery, or do anything that needs mental alertness until you know how this medicine affects you. Do not become pregnant while taking this medicine or  for 1 year after stopping it. Women should inform their doctor if they wish to become pregnant or think they might be pregnant. Men should not father a child while taking this medicine and for 4 months after stopping it. There is a potential for serious side effects to an unborn child. Talk to your health care professional or pharmacist for more information. Do not breast-feed an infant while taking this medicine. This medicine may interfere with the ability to have a child. This medicine has caused ovarian failure in some women. This medicine has caused reduced sperm counts in some men. You should talk with your doctor or health care professional if you are concerned about your fertility. If you are going to have surgery, tell your doctor or health care professional that you have taken this medicine. What side effects may I notice from receiving this medicine? Side effects that you should report to your doctor or health care professional as soon as possible:  allergic reactions like skin rash, itching or hives, swelling of the face, lips, or tongue  low blood counts - this medicine may decrease the number of white blood cells, red blood cells and platelets. You may be at increased risk for infections and bleeding.  signs of infection - fever or chills, cough, sore throat, pain or difficulty passing urine  signs of decreased platelets or bleeding - bruising, pinpoint red spots on the skin, black, tarry stools, blood in the urine  signs of decreased red blood cells - unusually weak or tired, fainting spells, lightheadedness  breathing problems  dark urine  dizziness  palpitations  swelling of the ankles, feet, hands  trouble passing urine or change in the amount of urine  weight gain  yellowing of the eyes or skin Side effects that usually do not require medical attention (report to your doctor or health care professional if they continue or are bothersome):  changes in nail or skin  color  hair loss  missed menstrual periods  mouth sores  nausea, vomiting This list may not describe all possible side effects. Call your doctor for medical advice about side effects. You may report side effects to FDA at 1-800-FDA-1088. Where should I keep my medicine? This drug is given in a hospital or clinic and will not be stored at home. NOTE: This sheet is a summary. It may not cover all possible information. If you have questions about this medicine, talk to your doctor, pharmacist, or health care provider.  2020 Elsevier/Gold Standard (2012-09-27 16:22:58) Paclitaxel injection What is this medicine? PACLITAXEL (PAK li TAX el) is a chemotherapy drug. It targets fast dividing cells, like cancer cells, and causes these cells to die. This medicine is used to treat ovarian cancer, breast cancer, lung cancer, Kaposi's sarcoma, and other cancers. This medicine may be used for other purposes; ask your health care provider or pharmacist if you have questions. COMMON BRAND NAME(S): Onxol, Taxol What should I tell my health care provider before I take this medicine? They need to know if you have any of these conditions:  history of irregular heartbeat  liver disease  low blood counts, like low white cell, platelet, or red cell counts  lung or breathing disease, like asthma  tingling of the fingers or toes, or other nerve disorder  an unusual or allergic reaction to paclitaxel, alcohol, polyoxyethylated castor oil, other chemotherapy, other medicines, foods, dyes, or preservatives  pregnant or trying to get pregnant  breast-feeding How should I use this medicine? This drug is given as an infusion into a vein. It is administered in a hospital or clinic by a specially trained health care professional. Talk to your pediatrician regarding the use of this medicine in children. Special care may be needed. Overdosage: If you think you have taken too much of this medicine contact a  poison control center or emergency room at once. NOTE: This medicine is only for you. Do not share this medicine with others. What if I miss a dose? It is important not to miss your dose. Call your doctor or health care professional if you are unable to keep an appointment. What may interact with this medicine? Do not take this medicine with any of the following medications:  disulfiram  metronidazole This medicine may also interact with the following medications:  antiviral medicines for hepatitis, HIV or AIDS  certain antibiotics like erythromycin and clarithromycin  certain medicines for fungal infections like ketoconazole and itraconazole  certain medicines for seizures like carbamazepine, phenobarbital, phenytoin  gemfibrozil  nefazodone  rifampin  St. John's wort This list may not describe all possible interactions. Give your health care provider a list of all the medicines, herbs, non-prescription drugs, or dietary supplements you use. Also tell them if you smoke, drink alcohol, or use illegal drugs. Some items may interact with your medicine. What should I watch for while using this medicine? Your condition will be monitored carefully while you are receiving this medicine. You will need important blood work done while you are taking this medicine. This medicine can cause serious allergic reactions. To reduce your risk you will need to take other medicine(s) before treatment with this medicine. If you experience allergic reactions like skin rash, itching or hives, swelling of the face, lips, or tongue, tell your doctor or health care professional right away. In some cases, you may be given additional medicines to help with side effects. Follow all directions for their use. This drug may make you feel generally unwell. This is not uncommon, as chemotherapy can affect healthy cells as well as cancer cells. Report any side effects. Continue your course of treatment even though you  feel ill unless your doctor tells you to stop. Call your doctor or health care professional for advice if you get a fever, chills or sore throat, or other symptoms of a cold or flu. Do not treat yourself. This drug decreases your body's ability to fight infections. Try to avoid being around people who are sick. This medicine  may increase your risk to bruise or bleed. Call your doctor or health care professional if you notice any unusual bleeding. Be careful brushing and flossing your teeth or using a toothpick because you may get an infection or bleed more easily. If you have any dental work done, tell your dentist you are receiving this medicine. Avoid taking products that contain aspirin, acetaminophen, ibuprofen, naproxen, or ketoprofen unless instructed by your doctor. These medicines may hide a fever. Do not become pregnant while taking this medicine. Women should inform their doctor if they wish to become pregnant or think they might be pregnant. There is a potential for serious side effects to an unborn child. Talk to your health care professional or pharmacist for more information. Do not breast-feed an infant while taking this medicine. Men are advised not to father a child while receiving this medicine. This product may contain alcohol. Ask your pharmacist or healthcare provider if this medicine contains alcohol. Be sure to tell all healthcare providers you are taking this medicine. Certain medicines, like metronidazole and disulfiram, can cause an unpleasant reaction when taken with alcohol. The reaction includes flushing, headache, nausea, vomiting, sweating, and increased thirst. The reaction can last from 30 minutes to several hours. What side effects may I notice from receiving this medicine? Side effects that you should report to your doctor or health care professional as soon as possible:  allergic reactions like skin rash, itching or hives, swelling of the face, lips, or  tongue  breathing problems  changes in vision  fast, irregular heartbeat  high or low blood pressure  mouth sores  pain, tingling, numbness in the hands or feet  signs of decreased platelets or bleeding - bruising, pinpoint red spots on the skin, black, tarry stools, blood in the urine  signs of decreased red blood cells - unusually weak or tired, feeling faint or lightheaded, falls  signs of infection - fever or chills, cough, sore throat, pain or difficulty passing urine  signs and symptoms of liver injury like dark yellow or brown urine; general ill feeling or flu-like symptoms; light-colored stools; loss of appetite; nausea; right upper belly pain; unusually weak or tired; yellowing of the eyes or skin  swelling of the ankles, feet, hands  unusually slow heartbeat Side effects that usually do not require medical attention (report to your doctor or health care professional if they continue or are bothersome):  diarrhea  hair loss  loss of appetite  muscle or joint pain  nausea, vomiting  pain, redness, or irritation at site where injected  tiredness This list may not describe all possible side effects. Call your doctor for medical advice about side effects. You may report side effects to FDA at 1-800-FDA-1088. Where should I keep my medicine? This drug is given in a hospital or clinic and will not be stored at home. NOTE: This sheet is a summary. It may not cover all possible information. If you have questions about this medicine, talk to your doctor, pharmacist, or health care provider.  2020 [employer]/Gold Standard (2017-07-17 13:14:55) Carboplatin injection What is this medicine? CARBOPLATIN (KAR boe pla tin) is a chemotherapy drug. It targets fast dividing cells, like cancer cells, and causes these cells to die. This medicine is used to treat ovarian cancer and many other cancers. This medicine may be used for other purposes; ask your health care provider or  pharmacist if you have questions. COMMON BRAND NAME(S): Paraplatin What should I tell my health care provider before I  take this medicine? They need to know if you have any of these conditions:  blood disorders  hearing problems  kidney disease  recent or ongoing radiation therapy  an unusual or allergic reaction to carboplatin, cisplatin, other chemotherapy, other medicines, foods, dyes, or preservatives  pregnant or trying to get pregnant  breast-feeding How should I use this medicine? This drug is usually given as an infusion into a vein. It is administered in a hospital or clinic by a specially trained health care professional. Talk to your pediatrician regarding the use of this medicine in children. Special care may be needed. Overdosage: If you think you have taken too much of this medicine contact a poison control center or emergency room at once. NOTE: This medicine is only for you. Do not share this medicine with others. What if I miss a dose? It is important not to miss a dose. Call your doctor or health care professional if you are unable to keep an appointment. What may interact with this medicine?  medicines for seizures  medicines to increase blood counts like filgrastim, pegfilgrastim, sargramostim  some antibiotics like amikacin, gentamicin, neomycin, streptomycin, tobramycin  vaccines Talk to your doctor or health care professional before taking any of these medicines:  acetaminophen  aspirin  ibuprofen  ketoprofen  naproxen This list may not describe all possible interactions. Give your health care provider a list of all the medicines, herbs, non-prescription drugs, or dietary supplements you use. Also tell them if you smoke, drink alcohol, or use illegal drugs. Some items may interact with your medicine. What should I watch for while using this medicine? Your condition will be monitored carefully while you are receiving this medicine. You will need  important blood work done while you are taking this medicine. This drug may make you feel generally unwell. This is not uncommon, as chemotherapy can affect healthy cells as well as cancer cells. Report any side effects. Continue your course of treatment even though you feel ill unless your doctor tells you to stop. In some cases, you may be given additional medicines to help with side effects. Follow all directions for their use. Call your doctor or health care professional for advice if you get a fever, chills or sore throat, or other symptoms of a cold or flu. Do not treat yourself. This drug decreases your body's ability to fight infections. Try to avoid being around people who are sick. This medicine may increase your risk to bruise or bleed. Call your doctor or health care professional if you notice any unusual bleeding. Be careful brushing and flossing your teeth or using a toothpick because you may get an infection or bleed more easily. If you have any dental work done, tell your dentist you are receiving this medicine. Avoid taking products that contain aspirin, acetaminophen, ibuprofen, naproxen, or ketoprofen unless instructed by your doctor. These medicines may hide a fever. Do not become pregnant while taking this medicine. Women should inform their doctor if they wish to become pregnant or think they might be pregnant. There is a potential for serious side effects to an unborn child. Talk to your health care professional or pharmacist for more information. Do not breast-feed an infant while taking this medicine. What side effects may I notice from receiving this medicine? Side effects that you should report to your doctor or health care professional as soon as possible:  allergic reactions like skin rash, itching or hives, swelling of the face, lips, or tongue  signs  of infection - fever or chills, cough, sore throat, pain or difficulty passing urine  signs of decreased platelets or  bleeding - bruising, pinpoint red spots on the skin, black, tarry stools, nosebleeds  signs of decreased red blood cells - unusually weak or tired, fainting spells, lightheadedness  breathing problems  changes in hearing  changes in vision  chest pain  high blood pressure  low blood counts - This drug may decrease the number of white blood cells, red blood cells and platelets. You may be at increased risk for infections and bleeding.  nausea and vomiting  pain, swelling, redness or irritation at the injection site  pain, tingling, numbness in the hands or feet  problems with balance, talking, walking  trouble passing urine or change in the amount of urine Side effects that usually do not require medical attention (report to your doctor or health care professional if they continue or are bothersome):  hair loss  loss of appetite  metallic taste in the mouth or changes in taste This list may not describe all possible side effects. Call your doctor for medical advice about side effects. You may report side effects to FDA at 1-800-FDA-1088. Where should I keep my medicine? This drug is given in a hospital or clinic and will not be stored at home. NOTE: This sheet is a summary. It may not cover all possible information. If you have questions about this medicine, talk to your doctor, pharmacist, or health care provider.  2020 [employer]/Gold Standard (2008-02-18 14:38:05)

## 2019-08-07 NOTE — Progress Notes (Signed)
START ON PATHWAY REGIMEN - Breast     A cycle is every 21 days:     Doxorubicin      Cyclophosphamide      Paclitaxel      Carboplatin   **Always confirm dose/schedule in your pharmacy ordering system**    Patient Characteristics: Preoperative or Nonsurgical Candidate (Clinical Staging), Neoadjuvant Therapy followed by Surgery, Invasive Disease, Chemotherapy, HER2 Negative/Unknown/Equivocal, ER Negative/Unknown, Platinum Therapy Indicated Therapeutic Status: Preoperative or Nonsurgical Candidate (Clinical Staging) AJCC M Category: cM0 AJCC Grade: G3 Breast Surgical Plan: Neoadjuvant Therapy followed by Surgery ER Status: Negative (-) AJCC 8 Stage Grouping: IIIB HER2 Status: Negative (-) AJCC T Category: cT2 AJCC N Category: cN1 PR Status: Negative (-) Type of Therapy: Platinum Therapy Indicated Intent of Therapy: Curative Intent, Discussed with Patient 

## 2019-08-08 ENCOUNTER — Inpatient Hospital Stay: Payer: Medicare HMO

## 2019-08-08 ENCOUNTER — Ambulatory Visit: Payer: Medicare HMO

## 2019-08-08 ENCOUNTER — Telehealth: Payer: Self-pay | Admitting: Licensed Clinical Social Worker

## 2019-08-08 ENCOUNTER — Other Ambulatory Visit: Payer: Medicare HMO

## 2019-08-08 ENCOUNTER — Ambulatory Visit: Payer: Medicare HMO | Admitting: Oncology

## 2019-08-08 NOTE — Progress Notes (Signed)
Patient is coming in for follow up she is doing well. She is a little nervous

## 2019-08-11 ENCOUNTER — Ambulatory Visit: Payer: Medicare HMO | Admitting: Oncology

## 2019-08-11 ENCOUNTER — Other Ambulatory Visit: Payer: Self-pay

## 2019-08-11 ENCOUNTER — Inpatient Hospital Stay (HOSPITAL_BASED_OUTPATIENT_CLINIC_OR_DEPARTMENT_OTHER): Payer: Medicare HMO | Admitting: Oncology

## 2019-08-11 ENCOUNTER — Inpatient Hospital Stay: Payer: Medicare HMO

## 2019-08-11 ENCOUNTER — Encounter: Payer: Self-pay | Admitting: Oncology

## 2019-08-11 ENCOUNTER — Encounter: Payer: Self-pay | Admitting: Pharmacy Technician

## 2019-08-11 ENCOUNTER — Inpatient Hospital Stay: Payer: Medicare HMO | Admitting: *Deleted

## 2019-08-11 ENCOUNTER — Other Ambulatory Visit: Payer: Medicare HMO

## 2019-08-11 VITALS — BP 118/58 | HR 73 | Temp 98.5°F | Ht 67.0 in | Wt 149.7 lb

## 2019-08-11 DIAGNOSIS — C50511 Malignant neoplasm of lower-outer quadrant of right female breast: Secondary | ICD-10-CM

## 2019-08-11 DIAGNOSIS — Z171 Estrogen receptor negative status [ER-]: Secondary | ICD-10-CM

## 2019-08-11 DIAGNOSIS — Z5112 Encounter for antineoplastic immunotherapy: Secondary | ICD-10-CM

## 2019-08-11 DIAGNOSIS — Z95828 Presence of other vascular implants and grafts: Secondary | ICD-10-CM

## 2019-08-11 DIAGNOSIS — Z5111 Encounter for antineoplastic chemotherapy: Secondary | ICD-10-CM

## 2019-08-11 LAB — COMPREHENSIVE METABOLIC PANEL
ALT: 13 U/L (ref 0–44)
AST: 16 U/L (ref 15–41)
Albumin: 3.6 g/dL (ref 3.5–5.0)
Alkaline Phosphatase: 70 U/L (ref 38–126)
Anion gap: 7 (ref 5–15)
BUN: 17 mg/dL (ref 6–20)
CO2: 29 mmol/L (ref 22–32)
Calcium: 8.9 mg/dL (ref 8.9–10.3)
Chloride: 102 mmol/L (ref 98–111)
Creatinine, Ser: 0.81 mg/dL (ref 0.44–1.00)
GFR calc Af Amer: >60 mL/min (ref >60)
GFR calc non Af Amer: >60 mL/min (ref >60)
Glucose, Bld: 127 mg/dL — ABNORMAL HIGH (ref 70–99)
Potassium: 3.7 mmol/L (ref 3.5–5.1)
Sodium: 138 mmol/L (ref 135–145)
Total Bilirubin: 0.6 mg/dL (ref 0.3–1.2)
Total Protein: 7.1 g/dL (ref 6.5–8.1)

## 2019-08-11 LAB — CBC WITH DIFFERENTIAL/PLATELET
Abs Immature Granulocytes: 0.05 10*3/uL (ref 0.00–0.07)
Basophils Absolute: 0 10*3/uL (ref 0.0–0.1)
Basophils Relative: 0 %
Eosinophils Absolute: 0.3 10*3/uL (ref 0.0–0.5)
Eosinophils Relative: 3 %
HCT: 35 % — ABNORMAL LOW (ref 36.0–46.0)
Hemoglobin: 11.4 g/dL — ABNORMAL LOW (ref 12.0–15.0)
Immature Granulocytes: 1 %
Lymphocytes Relative: 14 %
Lymphs Abs: 1.5 10*3/uL (ref 0.7–4.0)
MCH: 29.2 pg (ref 26.0–34.0)
MCHC: 32.6 g/dL (ref 30.0–36.0)
MCV: 89.7 fL (ref 80.0–100.0)
Monocytes Absolute: 0.8 10*3/uL (ref 0.1–1.0)
Monocytes Relative: 8 %
Neutro Abs: 7.8 10*3/uL — ABNORMAL HIGH (ref 1.7–7.7)
Neutrophils Relative %: 74 %
Platelets: 247 10*3/uL (ref 150–400)
RBC: 3.9 MIL/uL (ref 3.87–5.11)
RDW: 12.5 % (ref 11.5–15.5)
WBC: 10.4 10*3/uL (ref 4.0–10.5)
nRBC: 0 % (ref 0.0–0.2)

## 2019-08-11 MED ORDER — SODIUM CHLORIDE 0.9% FLUSH
10.0000 mL | Freq: Once | INTRAVENOUS | Status: AC
  Administered 2019-08-11: 10 mL via INTRAVENOUS
  Filled 2019-08-11: qty 10

## 2019-08-11 MED ORDER — DOXORUBICIN HCL CHEMO IV INJECTION 2 MG/ML
60.0000 mg/m2 | Freq: Once | INTRAVENOUS | Status: AC
  Administered 2019-08-11: 106 mg via INTRAVENOUS
  Filled 2019-08-11: qty 50

## 2019-08-11 MED ORDER — SODIUM CHLORIDE 0.9 % IV SOLN
Freq: Once | INTRAVENOUS | Status: AC
  Administered 2019-08-11: 11:00:00 via INTRAVENOUS
  Filled 2019-08-11: qty 250

## 2019-08-11 MED ORDER — SODIUM CHLORIDE 0.9 % IV SOLN
200.0000 mg | Freq: Once | INTRAVENOUS | Status: AC
  Administered 2019-08-11: 200 mg via INTRAVENOUS
  Filled 2019-08-11: qty 8

## 2019-08-11 MED ORDER — SODIUM CHLORIDE 0.9 % IV SOLN
1000.0000 mg | Freq: Once | INTRAVENOUS | Status: AC
  Administered 2019-08-11: 1000 mg via INTRAVENOUS
  Filled 2019-08-11: qty 50

## 2019-08-11 MED ORDER — PALONOSETRON HCL INJECTION 0.25 MG/5ML
0.2500 mg | Freq: Once | INTRAVENOUS | Status: AC
  Administered 2019-08-11: 0.25 mg via INTRAVENOUS
  Filled 2019-08-11: qty 5

## 2019-08-11 MED ORDER — HEPARIN SOD (PORK) LOCK FLUSH 100 UNIT/ML IV SOLN
500.0000 [IU] | Freq: Once | INTRAVENOUS | Status: AC | PRN
  Administered 2019-08-11: 500 [IU]
  Filled 2019-08-11: qty 5

## 2019-08-11 MED ORDER — SODIUM CHLORIDE 0.9 % IV SOLN: 600.0000 mg/m2 | Freq: Once | INTRAVENOUS | Status: DC

## 2019-08-11 MED ORDER — SODIUM CHLORIDE 0.9 % IV SOLN
Freq: Once | INTRAVENOUS | Status: AC
  Administered 2019-08-11: 12:00:00 via INTRAVENOUS
  Filled 2019-08-11: qty 5

## 2019-08-11 NOTE — Progress Notes (Signed)
Hematology/Oncology Consult note [facility]  [phone] [phone]  Patient Care Team: Tonia Ghent, MD as PCP - General (Family Medicine) Bary Castilla, Forest Gleason, MD (General Surgery) Modesto Charon, MD (Family Medicine) Laneta Simmers as Physician Assistant (Urology) Teodoro Spray, MD as Consulting Physician (Cardiology)   Name of the patient: Debra Little  [id number]  July 26, 1956   Date of visit: 08/11/19  Diagnosis-clinical prognostic stage IIIb triple negative breast cancer with metaplastic features cT2 cN1 M0  Chief complaint/ Reason for visit-on treatment assessment prior to cycle 1 of neoadjuvant Keytruda and AC chemotherapy  Heme/Onc history: Patient is a 63 year old female with seen Dr. Tollie Pizza in the past and has undergone breast biopsies which did not previously showed malignancy. More recently patient noted some red discoloration around her right perioral as well as a palpable mass which led to a diagnostic mammogram on the right side her prior mammogram in February 2020 was normal in August 2020 she was noted to have a 2.7 x 2 x 2.3 cm mass in her right breast along with 4 morphologically abnormal lymph nodes. She underwent breast biopsy as well as lymph node biopsy which showed grade 3 invasive mammary carcinoma. ER PR and HER-2/neu negative.  Sections show high-grade invasive carcinoma with focal squamous differentiation and pinpoint keratin ideation.  The features are concerning for metaplastic differentiation.[name] Lymphovascular invasion present. There was extranodal extension present on the lymph node biopsy.   Her family history is significant for breast cancer in her mother in her 90s. Father had colon cancer in his70s.Family history also significant for ovarian cancer in her maternal aunt.   MRI of bilateral breasts showed:  Primary right breast mass was 3.5 x 2.7 x 3.4 cm.  There were  surrounding numerous nodules consistent with satellite lesions.  Extensive nodular non-mass enhancement throughout the right breast involving the upper and lower quadrants spanning 6.2 x 4.5 x 5.5 cm.  Markedly enhancement of the left breast diffusely.  Non-mass enhancement measures 1.9 x 2.4 cm.  At least 3 abnormal lymph nodes in the right axilla.  No abnormal left axillary lymph nodes.    PET CT scan showed hypermetabolic possible satellite nodule just cephalad to the right breast lesion.  Equivocal inferior breast and left axillary nodal hypermetabolism.  No evidence of extrathoracic hypermetabolic metastases.  Lateral right breast primary with right axillary nodal metastases.  Interval history-she feels anxious but denies other complaints at this time.  ECOG PS- 1 Pain scale- 0   Review of systems- Review of Systems  Constitutional: Negative for chills, fever, malaise/fatigue and weight loss.  HENT: Negative for congestion, ear discharge and nosebleeds.   Eyes: Negative for blurred vision.  Respiratory: Negative for cough, hemoptysis, sputum production, shortness of breath and wheezing.   Cardiovascular: Negative for chest pain, palpitations, orthopnea and claudication.  Gastrointestinal: Negative for abdominal pain, blood in stool, constipation, diarrhea, heartburn, melena, nausea and vomiting.  Genitourinary: Negative for dysuria, flank pain, frequency, hematuria and urgency.  Musculoskeletal: Positive for joint pain. Negative for back pain and myalgias.  Skin: Negative for rash.  Neurological: Negative for dizziness, tingling, focal weakness, seizures, weakness and headaches.  Endo/Heme/Allergies: Does not bruise/bleed easily.  Psychiatric/Behavioral: Negative for depression and suicidal ideas. The patient is nervous/anxious. The patient does not have insomnia.       Allergies  Allergen Reactions   Amitiza [Lubiprostone] Other (See Comments)    Overly effective at higher dose  Bentyl [Dicyclomine Hcl]     Lack of effect   Buspar [Buspirone] Other (See Comments)    Palpitations.    Celecoxib     REACTION: unspecified   Cortisone Other (See Comments)    flush   Doxycycline     Palpitations and chest pain   Mobic [Meloxicam]     Palpitations.  But can tolerate aleve   Nitrofurantoin Monohyd Macro    Penicillins     REACTION: rash   Sulfonamide Derivatives     REACTION: nausea   Zoloft [Sertraline Hcl]     nausea and diarrhea.        Past Medical History:  Diagnosis Date   Anxiety    sees Dr. [name]   Breast cancer [facility]) 2020   Cyst of breast    per [name]   Fibromyalgia    GERD (gastroesophageal reflux disease)    Heart murmur    Hemorrhoids    Herpes, genital 04/2015   confirmed with HSV 2 IgG   Hiatal hernia    Hypercholesterolemia    Dr. Kyra Searles   Hyperlipidemia    IBS (irritable bowel syndrome)    constipation predominant   IC (interstitial cystitis)    per [facility]   Mild depression (HCC)    MVP (mitral valve prolapse)    [facility] cards eval 2012   Osteopenia    2010/2017, DEXA at [facility];spine and fem neck   Vitamin D deficiency    history of     Past Surgical History:  Procedure Laterality Date   ABDOMINAL HYSTERECTOMY     APPENDECTOMY     BLADDER SURGERY     1980's   BREAST EXCISIONAL BIOPSY Right    benign   BREAST EXCISIONAL BIOPSY Bilateral    benign   COLONOSCOPY  09/2014   Dr. [name]   COLONOSCOPY  2009   at [facility] 1 POLYP (BENIGN)   excision of breast cysts     hx of multiple cyst aspirations   EXPLORATORY LAPAROTOMY     PORTA CATH INSERTION N/A 08/06/2019   Procedure: PORTA CATH INSERTION;  Surgeon: Katha Cabal, MD;  Location: [place] [facility];  Service: Cardiovascular;  Laterality: N/A;   PORTACATH PLACEMENT Left 08/05/2019   Procedure: INSERTION PORT-A-CATH, Attempted;  Surgeon: Jules Husbands, MD;  Location: [facility];  Service: General;  Laterality:  Left;   TUBAL LIGATION      Social History   Socioeconomic History   Marital status: Divorced    Spouse name: Not on file   Number of children: 1   Years of education: Not on file   Highest education level: Not on file  Occupational History   Occupation: [employer] resource strain: Not on file   Food insecurity    Worry: Not on file    Inability: Not on file   Transportation needs    Medical: Not on file    Non-medical: Not on file  Tobacco Use   Smoking status: Never Smoker   Smokeless tobacco: Never Used  Substance and Sexual Activity   Alcohol use: No    Alcohol/week: 0.0 standard drinks   Drug use: No   Sexual activity: Yes    Birth control/protection: Surgical    Comment: Hysterectomy  Lifestyle   Physical activity    Days per week: Not on file    Minutes per session: Not on file   Stress: Not on file  Relationships   Social  connections    Talks on phone: Not on file    Gets together: Not on file    Attends religious service: Not on file    Active member of club or organization: Not on file    Attends meetings of clubs or organizations: Not on file    Relationship status: Not on file   Intimate partner violence    Fear of current or ex partner: Not on file    Emotionally abused: Not on file    Physically abused: Not on file    Forced sexual activity: Not on file  Other Topics Concern   Not on file  Social History Narrative   Married in 1997/02/10, divorced as of 2017/02/10, 1 son from previous relationship   Brother with MVA at 62, in rest home for many years as of 02/10/2018- died of covid recently    Family History  Problem Relation Age of Onset   Breast cancer Mother 30   Hypertension Mother    Colon cancer Father 56   Hyperlipidemia Father    Ovarian cancer Maternal Aunt 80   Breast cancer Cousin 68   Gallbladder disease Paternal Grandmother    Liver cancer Cousin 64       Malignant     Current Outpatient  Medications:    acetaminophen (TYLENOL) 500 MG tablet, Take 1,000 mg by mouth every 6 (six) hours as needed for moderate pain., Disp: , Rfl:    atorvastatin (LIPITOR) 40 MG tablet, Take 40 mg by mouth daily., Disp: , Rfl: 1   Cholecalciferol (VITAMIN D) 125 MCG (5000 UT) CAPS, Take 5,000 Units by mouth daily. , Disp: , Rfl:    clonazePAM (KLONOPIN) 0.5 MG tablet, Take 0.5 mg by mouth 2 (two) times daily. , Disp: , Rfl:    dexamethasone (DECADRON) 4 MG tablet, Take 2 tablets by mouth daily starting the day after Carboplatin and Cytoxan x 3 days. Take with food., Disp: 30 tablet, Rfl: 1   DULoxetine (CYMBALTA) 60 MG capsule, Take 60 mg by mouth daily. , Disp: , Rfl:    gabapentin (NEURONTIN) 100 MG capsule, Take 1-2 capsules (100-200 mg total) by mouth 3 (three) times daily as needed. (Patient taking differently: Take 100 mg by mouth at bedtime. ), Disp: 90 capsule, Rfl: 1   HYDROcodone-acetaminophen (NORCO/VICODIN) 5-325 MG tablet, Take 1 tablet by mouth every 6 (six) hours as needed for moderate pain., Disp: 15 tablet, Rfl: 0   lidocaine-prilocaine (EMLA) cream, Apply to affected area once, Disp: 30 g, Rfl: 3   loratadine (CLARITIN) 10 MG tablet, Take 1 tablet (10 mg total) by mouth daily., Disp: , Rfl:    LORazepam (ATIVAN) 0.5 MG tablet, Take 1 tablet (0.5 mg total) by mouth every 6 (six) hours as needed (Nausea or vomiting)., Disp: 30 tablet, Rfl: 0   omeprazole (PRILOSEC) 20 MG capsule, Take 20 mg by mouth daily. , Disp: , Rfl:    ondansetron (ZOFRAN) 4 MG tablet, Take 1 tablet (4 mg total) by mouth every 8 (eight) hours as needed for nausea or vomiting., Disp: 20 tablet, Rfl: 0   ondansetron (ZOFRAN) 8 MG tablet, Take 1 tablet (8 mg total) by mouth 2 (two) times daily as needed. Start on the third day after chemotherapy., Disp: 30 tablet, Rfl: 1   Plecanatide (TRULANCE) 3 MG TABS, Take 3 mg by mouth every other day. , Disp: , Rfl:    prochlorperazine (COMPAZINE) 10 MG tablet,  Take 1 tablet (10 mg total) by mouth  every 6 (six) hours as needed (Nausea or vomiting)., Disp: 30 tablet, Rfl: 1   sennosides-docusate sodium (SENOKOT-S) 8.6-50 MG tablet, Take 1 tablet by mouth daily. Takes every other day, Disp: , Rfl:    valACYclovir (VALTREX) 500 MG tablet, Take 500 mg by mouth daily., Disp: , Rfl:    vitamin B-12 (CYANOCOBALAMIN) 500 MCG tablet, Take 500 mcg by mouth daily., Disp: , Rfl:    ALPRAZolam (XANAX) 0.5 MG tablet, Take 1 tablet (0.5 mg total) by mouth as needed for anxiety. Take 1-2 tablets as needed prior to your MRI (Patient not taking: Reported on 08/06/2019), Disp: 2 tablet, Rfl: 0  Physical exam:  Vitals:   08/11/19 0951  BP: (!) 118/58  Pulse: 73  Temp: 98.5 F (36.9 C)  TempSrc: Tympanic  Weight: 149 lb 11.2 oz (67.9 kg)  Height: '5\' 7"'$  (1.702 m)   Physical Exam Constitutional:      General: She is not in acute distress. HENT:     Head: Normocephalic and atraumatic.  Eyes:     Pupils: Pupils are equal, round, and reactive to light.  Neck:     Musculoskeletal: Normal range of motion.  Cardiovascular:     Rate and Rhythm: Normal rate and regular rhythm.     Heart sounds: Normal heart sounds.  Pulmonary:     Effort: Pulmonary effort is normal.     Breath sounds: Normal breath sounds.  Abdominal:     General: Bowel sounds are normal.     Palpations: Abdomen is soft.  Skin:    General: Skin is warm and dry.  Neurological:     Mental Status: She is alert and oriented to person, place, and time.   Unchanged size of right breast mass which measures about 5 cm on today's exam.  No palpable right axillary adenopathy.  CMP Latest Ref Rng & Units 08/11/2019  Glucose 70 - 99 mg/dL 127(H)  BUN 6 - 20 mg/dL 17  Creatinine 0.44 - 1.00 mg/dL 0.81  Sodium 135 - 145 mmol/L 138  Potassium 3.5 - 5.1 mmol/L 3.7  Chloride 98 - 111 mmol/L 102  CO2 22 - 32 mmol/L 29  Calcium 8.9 - 10.3 mg/dL 8.9  Total Protein 6.5 - 8.1 g/dL 7.1  Total Bilirubin 0.3  - 1.2 mg/dL 0.6  Alkaline Phos 38 - 126 U/L 70  AST 15 - 41 U/L 16  ALT 0 - 44 U/L 13   CBC Latest Ref Rng & Units 08/11/2019  WBC 4.0 - 10.5 K/uL 10.4  Hemoglobin 12.0 - 15.0 g/dL 11.4(L)  Hematocrit 36.0 - 46.0 % 35.0(L)  Platelets 150 - 400 K/uL 247    No images are attached to the encounter.  Nm Cardiac Muga Rest  Result Date: 08/01/2019 CLINICAL DATA:  RIGHT breast cancer, pre cardiotoxic chemotherapy EXAM: NUCLEAR MEDICINE CARDIAC BLOOD POOL IMAGING (MUGA) TECHNIQUE: Cardiac multi-gated acquisition was performed at rest following intravenous injection of Tc-68mlabeled red blood cells. RADIOPHARMACEUTICALS:  21.28 mCi Tc-99mertechnetate in-vitro labeled red blood cells IV COMPARISON:  None FINDINGS: Calculated LEFT ventricular ejection fraction is 57.2%, normal Study was obtained at a cardiac rate of 66 bpm. Patient was fairly rhythmic during imaging. Cine analysis of the LEFT ventricle in 3 projections demonstrates normal LEFT ventricular wall motion. IMPRESSION: Normal LEFT ventricular ejection fraction of 57.2%. Normal LV wall motion. Electronically Signed   By: MaLavonia Dana.D.   On: 08/01/2019 14:20   Mr Breast Bilateral [place]  Result  Date: 07/31/2019 CLINICAL DATA:  Recently diagnosed malignancy in the right breast. LABS:  None EXAM: BILATERAL BREAST MRI WITH AND WITHOUT CONTRAST TECHNIQUE: Multiplanar, multisequence MR images of both breasts were obtained prior to and following the intravenous administration of 6 ml of Gadavist Three-dimensional MR images were rendered by post-processing of the original MR data on an independent workstation. The three-dimensional MR images were interpreted, and findings are reported in the following complete MRI report for this study. Three dimensional images were evaluated at the independent DynaCad workstation COMPARISON:  Previous exam(s). FINDINGS: Breast composition: c. Heterogeneous fibroglandular tissue. Background parenchymal  enhancement: Marked Right breast: The patient's known malignancy is identified in the lower outer quadrant of the right breast. Hematoma extends anterior to the malignancy along the biopsy tract. The borders of the known malignancy are somewhat obscured by extensive enhancement in the lateral right breast making exact measurement of the primary tumor difficulty. That being said, the primary malignancy measures at least 3.5 by 2.7 by 3.4 cm in AP, transverse, and craniocaudal dimensions. There is enhancement along the hematoma/biopsy tract extending anteriorly. There are numerous surrounding nodules of enhancement consistent with satellite lesions. There is extensive nodular and non mass enhancement throughout almost the entire lateral right breast involving both the upper and lower outer quadrants. A representative nodule of enhancement in the anterior inferolateral right breast is seen on series 6, image 39 measuring 8 mm. Extensive non mass enhancement is seen in the upper outer right breast such as on series 6, image 62 spanning at least 5.3 cm. The most prominent portion of this non mass enhancement is located posteriorly. Abnormal enhancement extends to midline but not definitely across midline. Counting the primary malignancy and the surrounding enhancement, the maximum anterior diameter is at least 6.2 cm. The maximum transverse diameter is at least 4.5 cm. The maximum craniocaudal dimension is approximately 5.5 cm. Left breast: There is marked background enhancement in the left breast diffusely. There is an non mass enhancement in the inferior left breast primarily located at 6 o'clock. This non mass enhancement is well seen on series 6, images 31-37. The maximum dimension is approximately 1.9 cm in AP dimension. On sagittal reconstructed images, the maximum craniocaudal dimension is approximately 2.4 cm. Lymph nodes: At least 3 abnormal lymph nodes are seen in the right axilla. At least 4 abnormal nodes  were seen on ultrasound. No abnormal nodes in the left axilla. No abnormal internal mammary nodes identified. Ancillary findings:  None. IMPRESSION: 1. There is extensive enhancement throughout almost the entire lateral right breast. This makes it difficult to accurately measure the recently diagnosed right bright breast cancer. However, the best measurement for the primary mass is 3.5 x 2.7 x 3.4 cm. The extensive enhancement throughout the lateral right breast is composed of both non mass enhancement and multiple nodules. I suspect this all represents malignancy. The anterior inferolateral most extent is seen on series 6, image 39 and the most posterosuperior extent is seen on series 6, image 62 the suspected malignancy extends at least to midline but not definitely across. 2. There is abnormal enhancement in the inferior left breast centered at 6 o'clock. This region of non mass enhancement is irregular and is best seen on series 6, images [id number]. The non mass enhancement appears to measure at least 2.4 x 1.9 cm in craniocaudal and AP dimensions. The most conspicuous portion of this non mass enhancement is on series 6, image 35 3. Three abnormal nodes are seen in the  right axilla. At least 4 nodes were seen under ultrasound. No left axillary or internal mammary adenopathy. RECOMMENDATION: If breast conservation is being considered, recommend MRI biopsy of the most superior posterior portion of the suspected malignancy seen on series 6, image 62 and the most anterior inferior portion of suspected disease seen on series 6, image 39. Also, recommend biopsying the abnormal non mass enhancement in the inferior left breast best seen on series 6, image 35. BI-RADS CATEGORY  4: Suspicious. Electronically Signed   By: Dorise Bullion III M.D   On: 07/31/2019 15:49   Nm Pet Image Initial (pi) Skull Base To Thigh  Result Date: 08/06/2019 CLINICAL DATA:  Initial treatment strategy for right-sided breast cancer. Attempted  left Port-A-Cath yesterday. EXAM: NUCLEAR MEDICINE PET SKULL BASE TO THIGH TECHNIQUE: 7.5 mCi F-18 FDG was injected intravenously. Full-ring PET imaging was performed from the skull base to thigh after the radiotracer. CT data was obtained and used for attenuation correction and anatomic localization. Fasting blood glucose: 89 mg/dl COMPARISON:  Breast MR of 07/31/2019. FINDINGS: Mediastinal blood pool activity: SUV max 2.3 Liver activity: SUV max NA NECK: No areas of abnormal hypermetabolism. Incidental CT findings: No cervical adenopathy. Presumably procedure related left-sided cervical edema is mild, including on 42/4. 1.0 cm nonspecific anterior left thyroid nodule on 55/4. CHEST: Posterior left axillary node measures 6 mm and a S.U.V. max of 2.6 on 69/4. Right axillary nodes measure maximally 1.0 cm and a S.U.V. max of 6.1 on 75/4. Lateral right breast primary measures on the order of 2.6 cm and a S.U.V. max of 13.7 on image 105/4. Just cephalad to this, a nodule measures 8 mm and a S.U.V. max of 3.4 on 98/4. Inferior left breast hypermetabolism is without well-defined correlate mass. Measures a S.U.V. max of 2.7, including on image 118/4. Incidental CT findings: Left chest wall and anterior mediastinal edema consistent with failed left Port-A-Cath placement. No well-defined hematoma. Trace right pleural fluid or thickening. Left lower lobe interstitial thickening. ABDOMEN/PELVIS: No abdominopelvic parenchymal or nodal hypermetabolism. Incidental CT findings: Normal adrenal glands. Abdominal aortic atherosclerosis. Hysterectomy. Mild pelvic floor laxity. SKELETON: No abnormal marrow activity. Incidental CT findings: none IMPRESSION: 1. Hypermetabolic lateral right breast primary with right axillary nodal metastasis. Possible satellite nodule just cephalad to the lateral right breast lesion. 2. Equivocal inferior left breast and left axillary nodal hypermetabolism. Please see MRI report of 07/31/2019. 3. No  evidence of extrathoracic hypermetabolic metastasis. 4. Sequelae of failed left Port-A-Cath, as on clinical history. Electronically Signed   By: Abigail Miyamoto M.D.   On: 08/06/2019 12:04   Dg Chest Port 1 View  Result Date: 08/05/2019 CLINICAL DATA:  Attempted Port-A-Cath EXAM: PORTABLE CHEST 1 VIEW COMPARISON:  05/25/2014 FINDINGS: Cardiomegaly. Both lungs are clear. The visualized skeletal structures are unremarkable. IMPRESSION: Cardiomegaly without acute abnormality of the lungs in AP portable projection. No pneumothorax or pleural. No unexpected radiopaque foreign body about the chest following attempted Port-A-Cath placement. Electronically Signed   By: Eddie Candle M.D.   On: 08/05/2019 10:50   Dg C-arm 1-60 Min-no Report  Result Date: 08/05/2019 Fluoroscopy was utilized by the requesting physician.  No radiographic interpretation.   US Breast Ltd Uni Right Inc Axilla  Result Date: 07/17/2019 CLINICAL DATA:  Patient describes a new palpable lump within the outer RIGHT breast. History of bilateral breast cysts. EXAM: DIGITAL DIAGNOSTIC RIGHT MAMMOGRAM WITH CAD AND TOMO ULTRASOUND RIGHT BREAST COMPARISON:  Previous exam(s). ACR Breast Density Category c: The breast tissue  is heterogeneously dense, which may obscure small masses. FINDINGS: There are multiple partially obscured masses within the upper-outer quadrant of the RIGHT breast, middle to posterior depth, largest measuring approximately 2.3 cm, corresponding to the area of clinical concern with overlying skin marker in place. There are no new dominant masses, suspicious calcifications or secondary signs of malignancy elsewhere within the RIGHT breast. Mammographic images were processed with CAD. Targeted ultrasound is performed, showing a nearly anechoic mass with indistinct margins in the RIGHT breast at the 7:30 o'clock axis, 5 cm from the nipple, measuring 2 cm greatest dimension, with surrounding shadowing tissues, the combination of nearly  anechoic mass and surrounding shadowing tissues measures 2.7 x 2 x 2.3 cm, corresponding to the mammographic findings. There are at least at least 4 morphologically abnormal lymph nodes, largest with cortical thickness of 6 mm. IMPRESSION: 1. Nearly anechoic mass with indistinct margins in the RIGHT breast at the 7:30 o'clock axis, 5 cm from the nipple, measuring 2 cm, with surrounding shadowing tissues, the combination of mass and surrounding shadowing tissues measuring 2.7 cm, corresponding to the mammographic finding, possibly benign cyst surrounded by dense shadowing tissues. Recommend ultrasound-guided biopsy of the area of shadowing tissues surrounding the suspected cyst. Also recommend subsequent aspiration of the cyst. 2. Ultrasound-guided biopsy of 1 of the morphologically abnormal lymph nodes in the RIGHT axilla. RECOMMENDATION: 1. Core biopsy of the shadowing tissue surrounding the suspected cyst in the RIGHT breast at the 7:30 o'clock axis, 5 cm from nipple. 2. Aspiration of the suspected cyst within the RIGHT breast at the 7:30 o'clock axis, 5 cm from the nipple, measuring approximately 2 cm greatest dimension. 3. Ultrasound-guided core biopsy of 1 of the morphologically abnormal lymph nodes in the RIGHT axilla. Ordering physician will be contacted with today's results and patient will then be scheduled for these ultrasound-guided procedures at her earliest convenience. I have discussed the findings and recommendations with the patient. Results were also provided in writing at the conclusion of the visit. If applicable, a reminder letter will be sent to the patient regarding the next appointment. BI-RADS CATEGORY  4: Suspicious. Electronically Signed   By: Franki Cabot M.D.   On: 07/17/2019 16:42   Mm Diag Breast Tomo Uni Right  Result Date: 07/17/2019 CLINICAL DATA:  Patient describes a new palpable lump within the outer RIGHT breast. History of bilateral breast cysts. EXAM: DIGITAL DIAGNOSTIC  RIGHT MAMMOGRAM WITH CAD AND TOMO ULTRASOUND RIGHT BREAST COMPARISON:  Previous exam(s). ACR Breast Density Category c: The breast tissue is heterogeneously dense, which may obscure small masses. FINDINGS: There are multiple partially obscured masses within the upper-outer quadrant of the RIGHT breast, middle to posterior depth, largest measuring approximately 2.3 cm, corresponding to the area of clinical concern with overlying skin marker in place. There are no new dominant masses, suspicious calcifications or secondary signs of malignancy elsewhere within the RIGHT breast. Mammographic images were processed with CAD. Targeted ultrasound is performed, showing a nearly anechoic mass with indistinct margins in the RIGHT breast at the 7:30 o'clock axis, 5 cm from the nipple, measuring 2 cm greatest dimension, with surrounding shadowing tissues, the combination of nearly anechoic mass and surrounding shadowing tissues measures 2.7 x 2 x 2.3 cm, corresponding to the mammographic findings. There are at least at least 4 morphologically abnormal lymph nodes, largest with cortical thickness of 6 mm. IMPRESSION: 1. Nearly anechoic mass with indistinct margins in the RIGHT breast at the 7:30 o'clock axis, 5 cm from the nipple,  measuring 2 cm, with surrounding shadowing tissues, the combination of mass and surrounding shadowing tissues measuring 2.7 cm, corresponding to the mammographic finding, possibly benign cyst surrounded by dense shadowing tissues. Recommend ultrasound-guided biopsy of the area of shadowing tissues surrounding the suspected cyst. Also recommend subsequent aspiration of the cyst. 2. Ultrasound-guided biopsy of 1 of the morphologically abnormal lymph nodes in the RIGHT axilla. RECOMMENDATION: 1. Core biopsy of the shadowing tissue surrounding the suspected cyst in the RIGHT breast at the 7:30 o'clock axis, 5 cm from nipple. 2. Aspiration of the suspected cyst within the RIGHT breast at the 7:30 o'clock  axis, 5 cm from the nipple, measuring approximately 2 cm greatest dimension. 3. Ultrasound-guided core biopsy of 1 of the morphologically abnormal lymph nodes in the RIGHT axilla. Ordering physician will be contacted with today's results and patient will then be scheduled for these ultrasound-guided procedures at her earliest convenience. I have discussed the findings and recommendations with the patient. Results were also provided in writing at the conclusion of the visit. If applicable, a reminder letter will be sent to the patient regarding the next appointment. BI-RADS CATEGORY  4: Suspicious. Electronically Signed   By: Franki Cabot M.D.   On: 07/17/2019 16:42   Mm Clip Placement Right  Result Date: 07/24/2019 CLINICAL DATA:  Evaluate biopsy marker EXAM: DIAGNOSTIC RIGHT MAMMOGRAM POST ULTRASOUND BIOPSY COMPARISON:  Previous exam(s). FINDINGS: Mammographic images were obtained following ultrasound guided biopsy of a right breast mass. The coil shaped clip is within the biopsied right breast mass. The mass is smaller after biopsy. The HydroMARK clip could not be pulled into view but was clearly seen under ultrasound within the biopsied lymph node. IMPRESSION: Appropriate clip placement within the right breast mass as above. The HydroMARK clip within the biopsied lymph node could not be pulled into view on this mammogram. Final Assessment: Post Procedure Mammograms for Marker Placement Electronically Signed   By: Dorise Bullion III M.D   On: 07/24/2019 14:29   [place] Rt Breast Bx W Loc Dev 1st Lesion Img Bx Spec US Guide  Addendum Date: 07/29/2019   ADDENDUM REPORT: 07/29/2019 12:36 ADDENDUM: PATHOLOGY revealed: RIGHT breast mass at 7:30 o'clock, 5 cm from nipple- Invasive Mammary Carcinoma, with focal squamous differentiation. RIGHT axillary node- positive for metastatic carcinoma; measuring at least 7.5 mm in greatest extent, with extranodal extension. Pathology results are CONCORDANT with imaging  findings, per Dr. Dorise Bullion. Pathology results were discussed with patient via telephone. The patient reported doing well after the biopsy with tenderness at the site. Post biopsy care instructions were reviewed and questions were answered. The patient was encouraged to call [facility] for any additional concerns. Recommendation: Surgical referral. Request for surgical referral was relayed to nurse navigators at [facility] by Electa Sniff RN on 07/28/2019. Addendum by Electa Sniff RN on 07/29/2019. Electronically Signed   By: Dorise Bullion III M.D   On: 07/29/2019 12:36   Result Date: 07/29/2019 CLINICAL DATA:  Biopsy right breast mass and abnormal right axillary node. EXAM: ULTRASOUND GUIDED RIGHT BREAST CORE NEEDLE BIOPSY COMPARISON:  Previous exam(s). FINDINGS: I met with the patient and we discussed the procedure of ultrasound-guided biopsy, including benefits and alternatives. We discussed the high likelihood of a successful procedure. We discussed the risks of the procedure, including infection, bleeding, tissue injury, clip migration, and inadequate sampling. Informed written consent was given. The usual time-out protocol was performed immediately prior to the procedure. Lesion quadrant: Lower-outer Using sterile  technique and 1% Lidocaine as local anesthetic, under direct ultrasound visualization, a 12 gauge spring-loaded device was used to perform biopsy of the 730 right breast mass using a medial approach. At the conclusion of the procedure a coil shaped tissue marker clip was deployed into the biopsy cavity. Follow up 2 view mammogram was performed and dictated separately. Lesion quadrant: Right axilla Using sterile technique and 1% Lidocaine as local anesthetic, under direct ultrasound visualization, a 14 gauge spring-loaded device was used to perform biopsy of a right axillary lymph node using a lateral approach. At the conclusion of the procedure a HydroMARK tissue  marker clip was deployed into the biopsy cavity. Follow up 2 view mammogram was performed and dictated separately. IMPRESSION: Ultrasound guided biopsy of a right breast mass and a right axillary lymph node. No apparent complications. Electronically Signed: By: Dorise Bullion III M.D On: 07/24/2019 14:22   [place] Rt Breast Bx W Loc Dev Ea Add Lesion Img Bx Spec US Guide  Addendum Date: 07/29/2019   ADDENDUM REPORT: 07/29/2019 12:36 ADDENDUM: PATHOLOGY revealed: RIGHT breast mass at 7:30 o'clock, 5 cm from nipple- Invasive Mammary Carcinoma, with focal squamous differentiation. RIGHT axillary node- positive for metastatic carcinoma; measuring at least 7.5 mm in greatest extent, with extranodal extension. Pathology results are CONCORDANT with imaging findings, per Dr. Dorise Bullion. Pathology results were discussed with patient via telephone. The patient reported doing well after the biopsy with tenderness at the site. Post biopsy care instructions were reviewed and questions were answered. The patient was encouraged to call [facility] for any additional concerns. Recommendation: Surgical referral. Request for surgical referral was relayed to nurse navigators at [facility] by Electa Sniff RN on 07/28/2019. Addendum by Electa Sniff RN on 07/29/2019. Electronically Signed   By: Dorise Bullion III M.D   On: 07/29/2019 12:36   Result Date: 07/29/2019 CLINICAL DATA:  Biopsy right breast mass and abnormal right axillary node. EXAM: ULTRASOUND GUIDED RIGHT BREAST CORE NEEDLE BIOPSY COMPARISON:  Previous exam(s). FINDINGS: I met with the patient and we discussed the procedure of ultrasound-guided biopsy, including benefits and alternatives. We discussed the high likelihood of a successful procedure. We discussed the risks of the procedure, including infection, bleeding, tissue injury, clip migration, and inadequate sampling. Informed written consent was given. The usual time-out protocol was  performed immediately prior to the procedure. Lesion quadrant: Lower-outer Using sterile technique and 1% Lidocaine as local anesthetic, under direct ultrasound visualization, a 12 gauge spring-loaded device was used to perform biopsy of the 730 right breast mass using a medial approach. At the conclusion of the procedure a coil shaped tissue marker clip was deployed into the biopsy cavity. Follow up 2 view mammogram was performed and dictated separately. Lesion quadrant: Right axilla Using sterile technique and 1% Lidocaine as local anesthetic, under direct ultrasound visualization, a 14 gauge spring-loaded device was used to perform biopsy of a right axillary lymph node using a lateral approach. At the conclusion of the procedure a HydroMARK tissue marker clip was deployed into the biopsy cavity. Follow up 2 view mammogram was performed and dictated separately. IMPRESSION: Ultrasound guided biopsy of a right breast mass and a right axillary lymph node. No apparent complications. Electronically Signed: By: Dorise Bullion III M.D On: 07/24/2019 14:22     Assessment and plan- Patient is a 63 y.o. female with newly diagnosed clinical prognostic stage IIIb cT2 cN1 cM0 triple negative invasive mammary carcinoma of the right breast.  She is  here for on treatment assessment prior to cycle 1 of neoadjuvant AC chemotherapy and Keytruda  Counts are okay to proceed with cycle 1 of neoadjuvant AC chemotherapy and Keytruda today.  I will see her back in 10 days time to see how she tolerated her first cycle of chemotherapy and also to see if there has been any appreciable decrease in the size of her breast mass.  Given that she has metaplastic features in her breast cancer I plan to proceed with neoadjuvant chemotherapy cautiously and have a low threshold to send her for surgery if there is no significant shrinkage in her mass or if there is any further increase in the size of her mass  Patient has been evaluated by  cardiology Dr. [name] and he is okay from his standpoint for the patient to receive anthracycline.  Baseline echocardiogram did show an EF of 57%.  Baseline TSH is also normal to proceed with Keytruda today.   Visit Diagnosis 1. Malignant neoplasm of lower-outer quadrant of right breast of female, estrogen receptor negative ([place])   2. Encounter for antineoplastic chemotherapy   3. Encounter for antineoplastic immunotherapy      Dr. Randa Evens, MD, MPH [facility] at [facility] [phone] 08/11/2019 4:58 PM

## 2019-08-11 NOTE — Progress Notes (Signed)
Patient has been approved for drug assistance by [employer] for [employer]. The enrollment period is from 08/11/19-11/27/19 based on off label. First DOS covered is 08/11/19.

## 2019-08-11 NOTE — Telephone Encounter (Signed)
Debra Little returned my call and agreed to be scheduled for genetics. She will see me 9/17 at 1 pm virtually via Webex.

## 2019-08-11 NOTE — Addendum Note (Signed)
Addended by: Randa Evens C on: 08/11/2019 05:11 PM   Modules accepted: Level of Service

## 2019-08-12 ENCOUNTER — Telehealth: Payer: Self-pay

## 2019-08-12 ENCOUNTER — Other Ambulatory Visit: Payer: Self-pay

## 2019-08-12 ENCOUNTER — Inpatient Hospital Stay: Payer: Medicare HMO

## 2019-08-12 ENCOUNTER — Telehealth: Payer: Self-pay | Admitting: Licensed Clinical Social Worker

## 2019-08-12 DIAGNOSIS — C50511 Malignant neoplasm of lower-outer quadrant of right female breast: Secondary | ICD-10-CM

## 2019-08-12 DIAGNOSIS — Z171 Estrogen receptor negative status [ER-]: Secondary | ICD-10-CM

## 2019-08-12 DIAGNOSIS — Z5111 Encounter for antineoplastic chemotherapy: Secondary | ICD-10-CM | POA: Diagnosis not present

## 2019-08-12 MED ORDER — PEGFILGRASTIM-CBQV 6 MG/0.6ML ~~LOC~~ SOSY
6.0000 mg | PREFILLED_SYRINGE | Freq: Once | SUBCUTANEOUS | Status: AC
  Administered 2019-08-12: 6 mg via SUBCUTANEOUS

## 2019-08-12 NOTE — Telephone Encounter (Signed)
Called patient regarding upcoming Webex appointment, left a voicemail and e-mail has been sent.  °

## 2019-08-12 NOTE — Telephone Encounter (Signed)
Telephone call to patient for follow up after first chemotherapy.  Patient states not feeling too well today.  States she had to call MD on call due to feeling dizzy.   States dizziness better today.   Also feels a little nauseated.  Encouraged patient to take anti-nausea meds if needed.  States is eating food and drinking plenty of water today.  No other complaints voiced.  Encouraged patient to call for any questions or concerns.

## 2019-08-13 ENCOUNTER — Ambulatory Visit: Payer: Medicare HMO

## 2019-08-13 ENCOUNTER — Encounter: Payer: Self-pay | Admitting: Licensed Clinical Social Worker

## 2019-08-13 ENCOUNTER — Inpatient Hospital Stay: Payer: Medicare HMO | Attending: Genetic Counselor | Admitting: Licensed Clinical Social Worker

## 2019-08-13 DIAGNOSIS — Z171 Estrogen receptor negative status [ER-]: Secondary | ICD-10-CM

## 2019-08-13 DIAGNOSIS — Z8041 Family history of malignant neoplasm of ovary: Secondary | ICD-10-CM | POA: Diagnosis not present

## 2019-08-13 DIAGNOSIS — Z803 Family history of malignant neoplasm of breast: Secondary | ICD-10-CM | POA: Diagnosis not present

## 2019-08-13 DIAGNOSIS — Z806 Family history of leukemia: Secondary | ICD-10-CM

## 2019-08-13 DIAGNOSIS — C50511 Malignant neoplasm of lower-outer quadrant of right female breast: Secondary | ICD-10-CM

## 2019-08-13 DIAGNOSIS — Z800 Family history of malignant neoplasm of digestive organs: Secondary | ICD-10-CM | POA: Diagnosis not present

## 2019-08-13 NOTE — Progress Notes (Signed)
REFERRING PROVIDER: Sindy Guadeloupe, MD [place],  [place] [address]  PRIMARY PROVIDER:  Tonia Ghent, MD  PRIMARY REASON FOR VISIT:  1. Malignant neoplasm of lower-outer quadrant of right breast of female, estrogen receptor negative ([place])   2. Family history of breast cancer   3. Family history of ovarian cancer   4. Family history of colon cancer   5. Family history of leukemia    I connected with Ms. Debra Little on 08/13/2019 at 1:00 PM EDT by Webex and verified that I am speaking with the correct person using two identifiers.    Patient location: home Provider location: clinic   HISTORY OF PRESENT ILLNESS:   Ms. Debra Little, a 63 y.o. female, was seen for a [place] cancer genetics consultation at the request of Dr. Janese Banks due to a personal and family history of cancer.  Ms. Debra Little presents to clinic today to discuss the possibility of a hereditary predisposition to cancer, genetic testing, and to further clarify her future cancer risks, as well as potential cancer risks for family members.   In 2020, at the age of 60, Ms. Debra Little was diagnosed with [facility] of the right breast, triple negative. The treatment plan includes chemotherapy and surgery.   CANCER HISTORY:  Oncology History  Breast cancer ([place])  07/30/2019 Initial Diagnosis   Breast cancer ([place])   08/06/2019 Cancer Staging   Staging form: Breast, AJCC 8th Edition - Clinical stage from 08/06/2019: Stage IIIB (cT2, cN1, cM0, G3, ER-, PR-, HER2-) - Signed by Sindy Guadeloupe, MD on 08/07/2019   08/11/2019 -  Chemotherapy   The patient had DOXOrubicin (ADRIAMYCIN) chemo injection 106 mg, 60 mg/m2 = 106 mg, Intravenous,  Once, 1 of 4 cycles Administration: 106 mg (08/11/2019) palonosetron (ALOXI) injection 0.25 mg, 0.25 mg, Intravenous,  Once, 1 of 8 cycles Administration: 0.25 mg (08/11/2019) pegfilgrastim-cbqv (UDENYCA) injection 6 mg, 6 mg, Subcutaneous, Once, 1 of 4 cycles Administration: 6 mg (08/12/2019) CARBOplatin  (PARAPLATIN) 200 mg in sodium chloride 0.9 % 100 mL chemo infusion, 200 mg (original dose ), Intravenous,  Once, 0 of 4 cycles Dose modification:   (Cycle 8), 200 mg (Cycle 8), 200 mg (Cycle 5), 200 mg (Cycle 5), 200 mg (Cycle 6) cyclophosphamide (CYTOXAN) 1,060 mg in sodium chloride 0.9 % 250 mL chemo infusion, 600 mg/m2 = 1,060 mg, Intravenous,  Once, 1 of 4 cycles PACLitaxel (TAXOL) 138 mg in sodium chloride 0.9 % 250 mL chemo infusion (</= '80mg'$ /m2), 80 mg/m2, Intravenous,  Once, 0 of 4 cycles pembrolizumab (KEYTRUDA) 200 mg in sodium chloride 0.9 % 50 mL chemo infusion, 200 mg (100 % of original dose 200 mg), Intravenous, Once, 1 of 8 cycles Dose modification: 200 mg (original dose 200 mg, Cycle 1, Reason: Provider Judgment) Administration: 200 mg (08/11/2019) fosaprepitant (EMEND) 150 mg, dexamethasone (DECADRON) 12 mg in sodium chloride 0.9 % 145 mL IVPB, , Intravenous,  Once, 1 of 4 cycles Administration:  (08/11/2019)  for chemotherapy treatment.       RISK FACTORS:  Menarche was at age 45.  First live birth at age 76.  OCP use: yes, unsure how long Ovaries intact: no.  Hysterectomy: yes.  Menopausal status: postmenopausal.  HRT use: several  years. Colonoscopy: yes; polyps. Mammogram within the last year: yes. Number of breast biopsies: She reports 6 Any excessive radiation exposure in the past: no  Past Medical History:  Diagnosis Date  . Anxiety    sees Dr. [name]  . Breast cancer ([place])  2020  . Cyst of breast    per [name]  . Family history of breast cancer   . Family history of colon cancer   . Family history of leukemia   . Family history of ovarian cancer   . Fibromyalgia   . GERD (gastroesophageal reflux disease)   . Heart murmur   . Hemorrhoids   . Herpes, genital 04/2015   confirmed with HSV 2 IgG  . Hiatal hernia   . Hypercholesterolemia    Dr. Kyra Searles  . Hyperlipidemia   . IBS (irritable bowel syndrome)    constipation predominant  . IC  (interstitial cystitis)    per [facility]  . Mild depression ([place])   . MVP (mitral valve prolapse)    [facility] cards eval 2012  . Osteopenia    2010/2017, DEXA at [facility];spine and fem neck  . Vitamin D deficiency    history of    Past Surgical History:  Procedure Laterality Date  . ABDOMINAL HYSTERECTOMY    . APPENDECTOMY    . BLADDER SURGERY     1980's  . BREAST EXCISIONAL BIOPSY Right    benign  . BREAST EXCISIONAL BIOPSY Bilateral    benign  . COLONOSCOPY  09/2014   Dr. [name]  . COLONOSCOPY  2009   at [facility] 1 POLYP (BENIGN)  . excision of breast cysts     hx of multiple cyst aspirations  . EXPLORATORY LAPAROTOMY    . PORTA CATH INSERTION N/A 08/06/2019   Procedure: PORTA CATH INSERTION;  Surgeon: Katha Cabal, MD;  Location: [facility];  Service: Cardiovascular;  Laterality: N/A;  . PORTACATH PLACEMENT Left 08/05/2019   Procedure: INSERTION PORT-A-CATH, Attempted;  Surgeon: Jules Husbands, MD;  Location: [facility];  Service: General;  Laterality: Left;  . TUBAL LIGATION      Social History   Socioeconomic History  . Marital status: Divorced    Spouse name: Not on file  . Number of children: 1  . Years of education: Not on file  . Highest education level: Not on file  Occupational History  . Occupation: [employer]  Social Needs  . Financial resource strain: Not on file  . Food insecurity    Worry: Not on file    Inability: Not on file  . Transportation needs    Medical: Not on file    Non-medical: Not on file  Tobacco Use  . Smoking status: Never Smoker  . Smokeless tobacco: Never Used  Substance and Sexual Activity  . Alcohol use: No    Alcohol/week: 0.0 standard drinks  . Drug use: No  . Sexual activity: Yes    Birth control/protection: Surgical    Comment: Hysterectomy  Lifestyle  . Physical activity    Days per week: Not on file    Minutes per session: Not on file  . Stress: Not on file  Relationships  . Social Herbalist  on phone: Not on file    Gets together: Not on file    Attends religious service: Not on file    Active member of club or organization: Not on file    Attends meetings of clubs or organizations: Not on file    Relationship status: Not on file  Other Topics Concern  . Not on file  Social History Narrative   Married in 1998, divorced as of 2018, 1 son from previous relationship   Brother with MVA at 77, in rest home for many years  as of 2019- died of covid recently     FAMILY HISTORY:  We obtained a detailed, 4-generation family history.  Significant diagnoses are listed below: Family History  Problem Relation Age of Onset  . Breast cancer Mother 46  . Hypertension Mother   . Colon cancer Father 60  . Hyperlipidemia Father   . Ovarian cancer Maternal Aunt 80  . Breast cancer Cousin   . Gallbladder disease Paternal Grandmother   . Liver cancer Cousin 70       Malignant  . Cancer Maternal Uncle        unsure type   Ms. Debra Little has one son, age 30, no history of cancer. She had one brother who died in 03-08-23 due to [place] at 40, he did not have a history of cancer.  Ms. Debra Little mother was diagnosed with breast cancer at 58, she died at 56. Patient had 3 maternal uncles, 4 maternal aunts. One of her uncles had cancer but she is unsure of the type. An aunt had ovarian cancer diagnosed at 61 and died at 71. Patient has one maternal cousin who had leukemia, no other known cancers in cousins. Her maternal grandmother died in her 70s, maternal grandfather died in his 63s.   Ms. Debra Little father was diagnosed with colon cancer in his late 86s and died at 32. Patient had 1 paternal aunt, no history of cancer and no children. Patient's maternal grandmother had gallbladder cancer and died at 47. Patient's maternal grandfather died at 36. Patient's father has a paternal cousin who had stomach cancer, and 2 maternal cousins with breast cancer.   Ms. Debra Little is unaware of previous family history of  genetic testing for hereditary cancer risks. Patient's maternal ancestors are of unknown descent, and paternal ancestors are of unknown descent. There is no reported Ashkenazi Jewish ancestry. There is no known consanguinity.  GENETIC COUNSELING ASSESSMENT: Ms. Debra Little is a 64 y.o. female with a personal and family history which is somewhat suggestive of a hereditary cancer syndrome and predisposition to cancer. We, therefore, discussed and recommended the following at today's visit.   DISCUSSION: We discussed that 5 - 10% of breast cancer is hereditary, with most cases associated with BRCA1/BRCA2 mutations.  There are other genes that can be associated with hereditary breast cancer syndromes.  We discussed that testing is beneficial for several reasons including surgical decision-making for breast cancer, knowing how to follow individuals after completing their treatment, and understand if other family members could be at risk for cancer and allow them to undergo genetic testing.   We reviewed the characteristics, features and inheritance patterns of hereditary cancer syndromes. We also discussed genetic testing, including the appropriate family members to test, the process of testing, insurance coverage and turn-around-time for results. We discussed the implications of a negative, positive and/or variant of uncertain significant result. We recommended Ms. Debra Little pursue genetic testing for the Common Hereditary Cancers gene panel.   The Common Hereditary Cancers Panel offered by [facility] includes sequencing and/or deletion duplication testing of the following 48 genes: APC, ATM, AXIN2, BARD1, BMPR1A, BRCA1, BRCA2, BRIP1, CDH1, CDKN2A (p14ARF), CDKN2A (p16INK4a), CKD4, CHEK2, CTNNA1, DICER1, EPCAM (Deletion/duplication testing only), GREM1 (promoter region deletion/duplication testing only), KIT, MEN1, MLH1, MSH2, MSH3, MSH6, MUTYH, NBN, NF1, NHTL1, PALB2, PDGFRA, PMS2, POLD1, POLE, PTEN, RAD50, RAD51C,  RAD51D, RNF43, SDHB, SDHC, SDHD, SMAD4, SMARCA4. STK11, TP53, TSC1, TSC2, and VHL.  The following genes were evaluated for sequence changes only: SDHA and HOXB13 c.251G>A variant only.  Based on Ms. Borton's personal and family history of cancer, she meets medical criteria for genetic testing. Despite that she meets criteria, she may still have an out of pocket cost.   PLAN: After considering the risks, benefits, and limitations, Ms. Debra Little provided informed consent to pursue genetic testing and the blood sample will be sent to [facility] for analysis of the Common Hereditary Cancers Panel. She will have blood drawn tomorrow, 9/17, at 2:45 at [facility].  Results should be available within approximately 2-3 weeks' time, at which point they will be disclosed by telephone to Ms. Debra Little, as will any additional recommendations warranted by these results. Ms. Debra Little will receive a summary of her genetic counseling visit and a copy of her results once available. This information will also be available in Epic.   Lastly, we encouraged Ms. Debra Little to remain in contact with cancer genetics annually so that we can continuously update the family history and inform her of any changes in cancer genetics and testing that may be of benefit for this family.   Ms. Debra Little questions were answered to her satisfaction today. Our contact information was provided should additional questions or concerns arise. Thank you for the referral and allowing [place] to share in the care of your patient.   Faith Rogue, MS, [facility] Genetic Counselor [email or web address] Phone: [phone]  The patient was seen for a total of 40 minutes in virtual genetic counseling.  Drs. Magrinat, Lindi Adie and/or Burr Medico were available for discussion regarding this case.   _______________________________________________________________________ For Office Staff:  Number of people involved in session: 1 Was an Intern/  student involved with case: no

## 2019-08-14 ENCOUNTER — Inpatient Hospital Stay: Payer: Medicare HMO

## 2019-08-14 ENCOUNTER — Encounter: Payer: Medicare HMO | Admitting: Licensed Clinical Social Worker

## 2019-08-14 ENCOUNTER — Other Ambulatory Visit: Payer: Self-pay

## 2019-08-14 DIAGNOSIS — F411 Generalized anxiety disorder: Secondary | ICD-10-CM | POA: Diagnosis not present

## 2019-08-14 DIAGNOSIS — R69 Illness, unspecified: Secondary | ICD-10-CM | POA: Diagnosis not present

## 2019-08-14 DIAGNOSIS — Z800 Family history of malignant neoplasm of digestive organs: Secondary | ICD-10-CM | POA: Diagnosis not present

## 2019-08-14 DIAGNOSIS — Z8041 Family history of malignant neoplasm of ovary: Secondary | ICD-10-CM | POA: Diagnosis not present

## 2019-08-14 DIAGNOSIS — Z803 Family history of malignant neoplasm of breast: Secondary | ICD-10-CM | POA: Diagnosis not present

## 2019-08-14 DIAGNOSIS — C50919 Malignant neoplasm of unspecified site of unspecified female breast: Secondary | ICD-10-CM | POA: Diagnosis not present

## 2019-08-15 ENCOUNTER — Other Ambulatory Visit (HOSPITAL_COMMUNITY): Payer: Self-pay | Admitting: Diagnostic Radiology

## 2019-08-15 ENCOUNTER — Ambulatory Visit
Admission: RE | Admit: 2019-08-15 | Discharge: 2019-08-15 | Disposition: A | Discharge status: Home / Self Care | Payer: Medicare HMO | Source: Ambulatory Visit | Attending: Oncology | Admitting: Oncology

## 2019-08-15 DIAGNOSIS — D0512 Intraductal carcinoma in situ of left breast: Secondary | ICD-10-CM | POA: Diagnosis not present

## 2019-08-15 DIAGNOSIS — R9389 Abnormal findings on diagnostic imaging of other specified body structures: Secondary | ICD-10-CM

## 2019-08-15 DIAGNOSIS — N6320 Unspecified lump in the left breast, unspecified quadrant: Secondary | ICD-10-CM

## 2019-08-15 DIAGNOSIS — N6325 Unspecified lump in the left breast, overlapping quadrants: Secondary | ICD-10-CM | POA: Diagnosis not present

## 2019-08-15 DIAGNOSIS — N6323 Unspecified lump in the left breast, lower outer quadrant: Secondary | ICD-10-CM | POA: Diagnosis not present

## 2019-08-15 MED ORDER — GADOBUTROL 1 MMOL/ML IV SOLN
6.0000 mL | Freq: Once | INTRAVENOUS | Status: AC | PRN
  Administered 2019-08-15: 6 mL via INTRAVENOUS

## 2019-08-18 NOTE — Research (Signed)
Patient consented and participated in the path procurement protocol. Please see research study notes for details. Jeral Fruit, RN, BSN, OCN

## 2019-08-20 ENCOUNTER — Ambulatory Visit: Payer: Self-pay | Admitting: Surgery

## 2019-08-21 NOTE — Progress Notes (Signed)
Patient is coming in for follow up, she is doing well. She mentions that when she swallows she has burning sensation. Patient was called Monday by Manuela Schwartz and was told she had cancer in left breast biopsy was last Friday. She would like to discuss this.

## 2019-08-22 ENCOUNTER — Encounter: Payer: Self-pay | Admitting: Oncology

## 2019-08-22 ENCOUNTER — Inpatient Hospital Stay (HOSPITAL_BASED_OUTPATIENT_CLINIC_OR_DEPARTMENT_OTHER): Payer: Medicare HMO | Admitting: Oncology

## 2019-08-22 ENCOUNTER — Other Ambulatory Visit: Payer: Self-pay

## 2019-08-22 VITALS — BP 116/55 | HR 68 | Temp 97.6°F | Resp 16 | Ht 67.0 in | Wt 151.6 lb

## 2019-08-22 DIAGNOSIS — Z171 Estrogen receptor negative status [ER-]: Secondary | ICD-10-CM | POA: Diagnosis not present

## 2019-08-22 DIAGNOSIS — Z5111 Encounter for antineoplastic chemotherapy: Secondary | ICD-10-CM | POA: Diagnosis not present

## 2019-08-22 DIAGNOSIS — C50511 Malignant neoplasm of lower-outer quadrant of right female breast: Secondary | ICD-10-CM | POA: Diagnosis not present

## 2019-08-22 NOTE — Progress Notes (Signed)
Pt said that she has burning with toothpaste-told her to use biotene. Had some nausea and it was better with nausea med. She had HA and spoke to Deans about it. She has cracked lip and using carmax.

## 2019-08-22 NOTE — Progress Notes (Signed)
Hematology/Oncology Consult note [facility]  Telephone:([phone] [phone]  Patient Care Team: Tonia Ghent, MD as PCP - General (Family Medicine) Bary Castilla, Forest Gleason, MD (General Surgery) Modesto Charon, MD (Family Medicine) Laneta Simmers as Physician Assistant (Urology) Teodoro Spray, MD as Consulting Physician (Cardiology)   Name of the patient: Debra Little  [id number]  03-Nov-1956   Date of visit: 08/22/19  Diagnosis- clinical prognostic stage IIIb triple negative breast cancer with metaplastic features cT2 cN1 M0  Chief complaint/ Reason for visit-toxicity check after cycle 1 of AC Keytruda  Heme/Onc history: Patient is a 63 year old female with seen Dr. Tollie Pizza in the past and has undergone breast biopsies which did not previously showed malignancy. More recently patient noted some red discoloration around her right perioral as well as a palpable mass which led to a diagnostic mammogram on the right side her prior mammogram in February 2020 was normal in August 2020 she was noted to have a 2.7 x 2 x 2.3 cm mass in her right breast along with 4 morphologically abnormal lymph nodes. She underwent breast biopsy as well as lymph node biopsy which showed grade 3 invasive mammary carcinoma. ER PR and HER-2/neunegative.Sections show high-grade invasive carcinoma with focal squamous differentiation and pinpoint keratin ideation. The features are concerning for metaplastic differentiation.[name] Lymphovascular invasion present. There was extranodal extension present on the lymph node biopsy.   Her family history is significant for breast cancer in her mother in her 8s. Father had colon cancer in his70s.Family history also significant for ovarian cancer in her maternal aunt.  MRI of bilateral breasts showed: Primary right breast mass was 3.5 x 2.7 x 3.4 cm. There were surrounding numerous nodules consistent with  satellite lesions. Extensive nodular non-mass enhancement throughout the right breast involving the upper and lower quadrants spanning 6.2 x 4.5 x 5.5 cm. Markedly enhancement of the left breast diffusely. Non-mass enhancement measures 1.9 x 2.4 cm. At least 3 abnormal lymph nodes in the right axilla. No abnormal left axillary lymph nodes.   PET CT scan showed hypermetabolic possible satellite nodule just cephalad to the right breast lesion. Equivocal inferior breast and left axillary nodal hypermetabolism. No evidence of extrathoracic hypermetabolic metastases. Lateral right breast primary with right axillary nodal metastases.   Interval history-patient did have some transient nausea as well as headache right after her chemotherapy which lasted for about 24 hours and then resolved.  She currently lives with her significant other and voices concerns about her sexual life.  Patient is not particularly interested in having any sexual intercourse or foreplay but her partner has been coaxing her to do it.  She has been following up with Dr. Jake Michaelis as well and has gone through counseling in the past.  ECOG PS- 1 Pain scale- 0   Review of systems- Review of Systems  Constitutional: Positive for malaise/fatigue. Negative for chills, fever and weight loss.  HENT: Negative for congestion, ear discharge and nosebleeds.   Eyes: Negative for blurred vision.  Respiratory: Negative for cough, hemoptysis, sputum production, shortness of breath and wheezing.   Cardiovascular: Negative for chest pain, palpitations, orthopnea and claudication.  Gastrointestinal: Negative for abdominal pain, blood in stool, constipation, diarrhea, heartburn, melena, nausea and vomiting.  Genitourinary: Negative for dysuria, flank pain, frequency, hematuria and urgency.  Musculoskeletal: Negative for back pain, joint pain and myalgias.  Skin: Negative for rash.  Neurological: Negative for dizziness, tingling, focal  weakness, seizures, weakness and headaches.  Endo/Heme/Allergies: Does not bruise/bleed easily.  Psychiatric/Behavioral: Negative for depression and suicidal ideas. The patient does not have insomnia.       Allergies  Allergen Reactions   Amitiza [Lubiprostone] Other (See Comments)    Overly effective at higher dose   Bentyl [Dicyclomine Hcl]     Lack of effect   Buspar [Buspirone] Other (See Comments)    Palpitations.    Celecoxib     REACTION: unspecified   Cortisone Other (See Comments)    flush   Doxycycline     Palpitations and chest pain   Mobic [Meloxicam]     Palpitations.  But can tolerate aleve   Nitrofurantoin Monohyd Macro    Penicillins     REACTION: rash   Sulfonamide Derivatives     REACTION: nausea   Zoloft [Sertraline Hcl]     nausea and diarrhea.        Past Medical History:  Diagnosis Date   Anxiety    sees Dr. [name]   Breast cancer [facility]) 2020   Cyst of breast    per [name]   Family history of breast cancer    Family history of colon cancer    Family history of leukemia    Family history of ovarian cancer    Fibromyalgia    GERD (gastroesophageal reflux disease)    Heart murmur    Hemorrhoids    Herpes, genital 04/2015   confirmed with HSV 2 IgG   Hiatal hernia    Hypercholesterolemia    Dr. Kyra Searles   Hyperlipidemia    IBS (irritable bowel syndrome)    constipation predominant   IC (interstitial cystitis)    per [place] [facility]   Mild depression ([place])    MVP (mitral valve prolapse)    [facility] cards eval 2012   Osteopenia    2010/2017, DEXA at [facility];spine and fem neck   Vitamin D deficiency    history of     Past Surgical History:  Procedure Laterality Date   ABDOMINAL HYSTERECTOMY     APPENDECTOMY     BLADDER SURGERY     1980's   BREAST EXCISIONAL BIOPSY Right    benign   BREAST EXCISIONAL BIOPSY Bilateral    benign   COLONOSCOPY  09/2014   Dr. [name]   COLONOSCOPY  2009    at [facility] 1 POLYP (BENIGN)   excision of breast cysts     hx of multiple cyst aspirations   EXPLORATORY LAPAROTOMY     PORTA CATH INSERTION N/A 08/06/2019   Procedure: PORTA CATH INSERTION;  Surgeon: Katha Cabal, MD;  Location: [facility];  Service: Cardiovascular;  Laterality: N/A;   PORTACATH PLACEMENT Left 08/05/2019   Procedure: INSERTION PORT-A-CATH, Attempted;  Surgeon: Jules Husbands, MD;  Location: [facility];  Service: General;  Laterality: Left;   TUBAL LIGATION      Social History   Socioeconomic History   Marital status: Divorced    Spouse name: Not on file   Number of children: 1   Years of education: Not on file   Highest education level: Not on file  Occupational History   Occupation: [employer]  Social Needs   Financial resource strain: Not on file   Food insecurity    Worry: Not on file    Inability: Not on file   Transportation needs    Medical: Not on file    Non-medical: Not on file  Tobacco Use   Smoking status: Never Smoker  Smokeless tobacco: Never Used  Substance and Sexual Activity   Alcohol use: No    Alcohol/week: 0.0 standard drinks   Drug use: No   Sexual activity: Yes    Birth control/protection: Surgical    Comment: Hysterectomy  Lifestyle   Physical activity    Days per week: Not on file    Minutes per session: Not on file   Stress: Not on file  Relationships   Social connections    Talks on phone: Not on file    Gets together: Not on file    Attends religious service: Not on file    Active member of club or organization: Not on file    Attends meetings of clubs or organizations: Not on file    Relationship status: Not on file   Intimate partner violence    Fear of current or ex partner: Not on file    Emotionally abused: Not on file    Physically abused: Not on file    Forced sexual activity: Not on file  Other Topics Concern   Not on file  Social History Narrative   Married in 1997-02-10, divorced as  of 2017/02/10, 1 son from previous relationship   Brother with MVA at 58, in rest home for many years as of February 10, 2018- died of covid recently    Family History  Problem Relation Age of Onset   Breast cancer Mother 57   Hypertension Mother    Colon cancer Father 51   Hyperlipidemia Father    Ovarian cancer Maternal Aunt 43   Breast cancer Cousin    Gallbladder disease Paternal Grandmother    Liver cancer Cousin 52       Malignant   Cancer Maternal Uncle        unsure type     Current Outpatient Medications:    acetaminophen (TYLENOL) 500 MG tablet, Take 1,000 mg by mouth every 6 (six) hours as needed for moderate pain., Disp: , Rfl:    atorvastatin (LIPITOR) 40 MG tablet, Take 40 mg by mouth daily., Disp: , Rfl: 1   Cholecalciferol (VITAMIN D) 125 MCG (5000 UT) CAPS, Take 5,000 Units by mouth daily. , Disp: , Rfl:    clonazePAM (KLONOPIN) 0.5 MG tablet, Take 0.5 mg by mouth 2 (two) times daily. , Disp: , Rfl:    dexamethasone (DECADRON) 4 MG tablet, Take 2 tablets by mouth daily starting the day after Carboplatin and Cytoxan x 3 days. Take with food., Disp: 30 tablet, Rfl: 1   DULoxetine (CYMBALTA) 60 MG capsule, Take 60 mg by mouth daily. , Disp: , Rfl:    gabapentin (NEURONTIN) 100 MG capsule, Take 1-2 capsules (100-200 mg total) by mouth 3 (three) times daily as needed. (Patient taking differently: Take 100 mg by mouth at bedtime. ), Disp: 90 capsule, Rfl: 1   HYDROcodone-acetaminophen (NORCO/VICODIN) 5-325 MG tablet, Take 1 tablet by mouth every 6 (six) hours as needed for moderate pain., Disp: 15 tablet, Rfl: 0   lidocaine-prilocaine (EMLA) cream, Apply to affected area once, Disp: 30 g, Rfl: 3   loratadine (CLARITIN) 10 MG tablet, Take 1 tablet (10 mg total) by mouth daily., Disp: , Rfl:    LORazepam (ATIVAN) 0.5 MG tablet, Take 1 tablet (0.5 mg total) by mouth every 6 (six) hours as needed (Nausea or vomiting)., Disp: 30 tablet, Rfl: 0   omeprazole (PRILOSEC) 20 MG  capsule, Take 20 mg by mouth daily. , Disp: , Rfl:    ondansetron (ZOFRAN) 4 MG tablet,  Take 1 tablet (4 mg total) by mouth every 8 (eight) hours as needed for nausea or vomiting., Disp: 20 tablet, Rfl: 0   ondansetron (ZOFRAN) 8 MG tablet, Take 1 tablet (8 mg total) by mouth 2 (two) times daily as needed. Start on the third day after chemotherapy., Disp: 30 tablet, Rfl: 1   Plecanatide (TRULANCE) 3 MG TABS, Take 3 mg by mouth every other day. , Disp: , Rfl:    prochlorperazine (COMPAZINE) 10 MG tablet, Take 1 tablet (10 mg total) by mouth every 6 (six) hours as needed (Nausea or vomiting)., Disp: 30 tablet, Rfl: 1   sennosides-docusate sodium (SENOKOT-S) 8.6-50 MG tablet, Take 1 tablet by mouth daily. Takes every other day, Disp: , Rfl:    valACYclovir (VALTREX) 500 MG tablet, Take 500 mg by mouth daily., Disp: , Rfl:    vitamin B-12 (CYANOCOBALAMIN) 500 MCG tablet, Take 500 mcg by mouth daily., Disp: , Rfl:    ALPRAZolam (XANAX) 0.5 MG tablet, Take 1 tablet (0.5 mg total) by mouth as needed for anxiety. Take 1-2 tablets as needed prior to your MRI (Patient not taking: Reported on 08/06/2019), Disp: 2 tablet, Rfl: 0  Physical exam:  Vitals:   08/22/19 1016 08/22/19 1028  BP: (!) 116/55   Pulse: 76 68  Resp: 16 16  Temp:  97.6 F (36.4 C)  TempSrc:  Tympanic  Weight: 151 lb 9.6 oz (68.8 kg)   Height: '5\' 7"'$  (1.702 m)    Physical Exam Constitutional:      General: She is not in acute distress. HENT:     Head: Normocephalic and atraumatic.  Eyes:     Pupils: Pupils are equal, round, and reactive to light.  Neck:     Musculoskeletal: Normal range of motion.  Cardiovascular:     Rate and Rhythm: Normal rate and regular rhythm.     Heart sounds: Normal heart sounds.  Pulmonary:     Effort: Pulmonary effort is normal.     Breath sounds: Normal breath sounds.  Abdominal:     General: Bowel sounds are normal.     Palpations: Abdomen is soft.  Skin:    General: Skin is warm and  dry.  Neurological:     Mental Status: She is alert and oriented to person, place, and time.     Right breast mass is more or less similar in size about 5 cm in size.   CMP Latest Ref Rng & Units 08/11/2019  Glucose 70 - 99 mg/dL 127(H)  BUN 6 - 20 mg/dL 17  Creatinine 0.44 - 1.00 mg/dL 0.81  Sodium 135 - 145 mmol/L 138  Potassium 3.5 - 5.1 mmol/L 3.7  Chloride 98 - 111 mmol/L 102  CO2 22 - 32 mmol/L 29  Calcium 8.9 - 10.3 mg/dL 8.9  Total Protein 6.5 - 8.1 g/dL 7.1  Total Bilirubin 0.3 - 1.2 mg/dL 0.6  Alkaline Phos 38 - 126 U/L 70  AST 15 - 41 U/L 16  ALT 0 - 44 U/L 13   CBC Latest Ref Rng & Units 08/11/2019  WBC 4.0 - 10.5 K/uL 10.4  Hemoglobin 12.0 - 15.0 g/dL 11.4(L)  Hematocrit 36.0 - 46.0 % 35.0(L)  Platelets 150 - 400 K/uL 247    No images are attached to the encounter.  Nm Cardiac Muga Rest  Result Date: 08/01/2019 CLINICAL DATA:  RIGHT breast cancer, pre cardiotoxic chemotherapy EXAM: NUCLEAR MEDICINE CARDIAC BLOOD POOL IMAGING (MUGA) TECHNIQUE: Cardiac multi-gated acquisition was performed at rest following  intravenous injection of Tc-93mlabeled red blood cells. RADIOPHARMACEUTICALS:  21.28 mCi Tc-987mertechnetate in-vitro labeled red blood cells IV COMPARISON:  None FINDINGS: Calculated LEFT ventricular ejection fraction is 57.2%, normal Study was obtained at a cardiac rate of 66 bpm. Patient was fairly rhythmic during imaging. Cine analysis of the LEFT ventricle in 3 projections demonstrates normal LEFT ventricular wall motion. IMPRESSION: Normal LEFT ventricular ejection fraction of 57.2%. Normal LV wall motion. Electronically Signed   By: MaLavonia Dana.D.   On: 08/01/2019 14:20   Mr Breast Bilateral W Wo Contrast Inc Cad  Result Date: 07/31/2019 CLINICAL DATA:  Recently diagnosed malignancy in the right breast. LABS:  None EXAM: BILATERAL BREAST MRI WITH AND WITHOUT CONTRAST TECHNIQUE: Multiplanar, multisequence MR images of both breasts were obtained prior to and  following the intravenous administration of 6 ml of Gadavist Three-dimensional MR images were rendered by post-processing of the original MR data on an independent workstation. The three-dimensional MR images were interpreted, and findings are reported in the following complete MRI report for this study. Three dimensional images were evaluated at the independent DynaCad workstation COMPARISON:  Previous exam(s). FINDINGS: Breast composition: c. Heterogeneous fibroglandular tissue. Background parenchymal enhancement: Marked Right breast: The patient's known malignancy is identified in the lower outer quadrant of the right breast. Hematoma extends anterior to the malignancy along the biopsy tract. The borders of the known malignancy are somewhat obscured by extensive enhancement in the lateral right breast making exact measurement of the primary tumor difficulty. That being said, the primary malignancy measures at least 3.5 by 2.7 by 3.4 cm in AP, transverse, and craniocaudal dimensions. There is enhancement along the hematoma/biopsy tract extending anteriorly. There are numerous surrounding nodules of enhancement consistent with satellite lesions. There is extensive nodular and non mass enhancement throughout almost the entire lateral right breast involving both the upper and lower outer quadrants. A representative nodule of enhancement in the anterior inferolateral right breast is seen on series 6, image 39 measuring 8 mm. Extensive non mass enhancement is seen in the upper outer right breast such as on series 6, image 62 spanning at least 5.3 cm. The most prominent portion of this non mass enhancement is located posteriorly. Abnormal enhancement extends to midline but not definitely across midline. Counting the primary malignancy and the surrounding enhancement, the maximum anterior diameter is at least 6.2 cm. The maximum transverse diameter is at least 4.5 cm. The maximum craniocaudal dimension is approximately  5.5 cm. Left breast: There is marked background enhancement in the left breast diffusely. There is an non mass enhancement in the inferior left breast primarily located at 6 o'clock. This non mass enhancement is well seen on series 6, images 31-37. The maximum dimension is approximately 1.9 cm in AP dimension. On sagittal reconstructed images, the maximum craniocaudal dimension is approximately 2.4 cm. Lymph nodes: At least 3 abnormal lymph nodes are seen in the right axilla. At least 4 abnormal nodes were seen on ultrasound. No abnormal nodes in the left axilla. No abnormal internal mammary nodes identified. Ancillary findings:  None. IMPRESSION: 1. There is extensive enhancement throughout almost the entire lateral right breast. This makes it difficult to accurately measure the recently diagnosed right bright breast cancer. However, the best measurement for the primary mass is 3.5 x 2.7 x 3.4 cm. The extensive enhancement throughout the lateral right breast is composed of both non mass enhancement and multiple nodules. I suspect this all represents malignancy. The anterior inferolateral most extent is seen  on series 6, image 39 and the most posterosuperior extent is seen on series 6, image 62 the suspected malignancy extends at least to midline but not definitely across. 2. There is abnormal enhancement in the inferior left breast centered at 6 o'clock. This region of non mass enhancement is irregular and is best seen on series 6, images 31240. The non mass enhancement appears to measure at least 2.4 x 1.9 cm in craniocaudal and AP dimensions. The most conspicuous portion of this non mass enhancement is on series 6, image 35 3. Three abnormal nodes are seen in the right axilla. At least 4 nodes were seen under ultrasound. No left axillary or internal mammary adenopathy. RECOMMENDATION: If breast conservation is being considered, recommend MRI biopsy of the most superior posterior portion of the suspected  malignancy seen on series 6, image 62 and the most anterior inferior portion of suspected disease seen on series 6, image 39. Also, recommend biopsying the abnormal non mass enhancement in the inferior left breast best seen on series 6, image 35. BI-RADS CATEGORY  4: Suspicious. Electronically Signed   By: Dorise Bullion III M.D   On: 07/31/2019 15:49   Nm Pet Image Initial (pi) Skull Base To Thigh  Result Date: 08/06/2019 CLINICAL DATA:  Initial treatment strategy for right-sided breast cancer. Attempted left Port-A-Cath yesterday. EXAM: NUCLEAR MEDICINE PET SKULL BASE TO THIGH TECHNIQUE: 7.5 mCi F-18 FDG was injected intravenously. Full-ring PET imaging was performed from the skull base to thigh after the radiotracer. CT data was obtained and used for attenuation correction and anatomic localization. Fasting blood glucose: 89 mg/dl COMPARISON:  Breast MR of 07/31/2019. FINDINGS: Mediastinal blood pool activity: SUV max 2.3 Liver activity: SUV max NA NECK: No areas of abnormal hypermetabolism. Incidental CT findings: No cervical adenopathy. Presumably procedure related left-sided cervical edema is mild, including on 42/4. 1.0 cm nonspecific anterior left thyroid nodule on 55/4. CHEST: Posterior left axillary node measures 6 mm and a S.U.V. max of 2.6 on 69/4. Right axillary nodes measure maximally 1.0 cm and a S.U.V. max of 6.1 on 75/4. Lateral right breast primary measures on the order of 2.6 cm and a S.U.V. max of 13.7 on image 105/4. Just cephalad to this, a nodule measures 8 mm and a S.U.V. max of 3.4 on 98/4. Inferior left breast hypermetabolism is without well-defined correlate mass. Measures a S.U.V. max of 2.7, including on image 118/4. Incidental CT findings: Left chest wall and anterior mediastinal edema consistent with failed left Port-A-Cath placement. No well-defined hematoma. Trace right pleural fluid or thickening. Left lower lobe interstitial thickening. ABDOMEN/PELVIS: No abdominopelvic  parenchymal or nodal hypermetabolism. Incidental CT findings: Normal adrenal glands. Abdominal aortic atherosclerosis. Hysterectomy. Mild pelvic floor laxity. SKELETON: No abnormal marrow activity. Incidental CT findings: none IMPRESSION: 1. Hypermetabolic lateral right breast primary with right axillary nodal metastasis. Possible satellite nodule just cephalad to the lateral right breast lesion. 2. Equivocal inferior left breast and left axillary nodal hypermetabolism. Please see MRI report of 07/31/2019. 3. No evidence of extrathoracic hypermetabolic metastasis. 4. Sequelae of failed left Port-A-Cath, as on clinical history. Electronically Signed   By: Abigail Miyamoto M.D.   On: 08/06/2019 12:04   Dg Chest Port 1 View  Result Date: 08/05/2019 CLINICAL DATA:  Attempted Port-A-Cath EXAM: PORTABLE CHEST 1 VIEW COMPARISON:  05/25/2014 FINDINGS: Cardiomegaly. Both lungs are clear. The visualized skeletal structures are unremarkable. IMPRESSION: Cardiomegaly without acute abnormality of the lungs in AP portable projection. No pneumothorax or pleural. No unexpected radiopaque  foreign body about the chest following attempted Port-A-Cath placement. Electronically Signed   By: Eddie Candle M.D.   On: 08/05/2019 10:50   Dg C-arm 1-60 Min-no Report  Result Date: 08/05/2019 Fluoroscopy was utilized by the requesting physician.  No radiographic interpretation.   Mm Clip Placement Left  Result Date: 08/15/2019 CLINICAL DATA:  Status post MRI guided core biopsy of in the left breast. EXAM: DIAGNOSTIC left MAMMOGRAM POST MRI BIOPSY COMPARISON:  Previous exam(s). FINDINGS: Mammographic images were obtained following left breast guided biopsy of mass at left breast 6 position. Cc and lateral views of the left breast demonstrate barbell in the area of concern. IMPRESSION: Post biopsy mammogram demonstrating clip in the area concern. Final Assessment: Post Procedure Mammograms for Marker Placement Electronically Signed   By:  Abelardo Diesel M.D.   On: 08/15/2019 10:20   Mm Clip Placement Right  Result Date: 07/24/2019 CLINICAL DATA:  Evaluate biopsy marker EXAM: DIAGNOSTIC RIGHT MAMMOGRAM POST ULTRASOUND BIOPSY COMPARISON:  Previous exam(s). FINDINGS: Mammographic images were obtained following ultrasound guided biopsy of a right breast mass. The coil shaped clip is within the biopsied right breast mass. The mass is smaller after biopsy. The HydroMARK clip could not be pulled into view but was clearly seen under ultrasound within the biopsied lymph node. IMPRESSION: Appropriate clip placement within the right breast mass as above. The HydroMARK clip within the biopsied lymph node could not be pulled into view on this mammogram. Final Assessment: Post Procedure Mammograms for Marker Placement Electronically Signed   By: Dorise Bullion III M.D   On: 07/24/2019 14:29   Mr Lt Breast Bx Johnella Moloney Dev 1st Lesion Image Bx Spec Mr Guide  Addendum Date: 08/18/2019   ADDENDUM REPORT: 08/18/2019 15:51 ADDENDUM: Pathology revealed INTERMEDIATE GRADE DUCTAL CARCINOMA IN SITU of the LEFT breast, 6 o'clock. This was found to be concordant by Dr. Abelardo Diesel. Pathology results were discussed with the patient by telephone. The patient reported doing well after the biopsy with tenderness at the site. Post biopsy instructions and care were reviewed and questions were answered. The patient was encouraged to call [facility] for any additional concerns. The patient has a recent diagnosis of RIGHT breast cancer and should follow her outlined treatment plan. Pathology results reported by Stacie Acres, RN on 08/18/2019. Electronically Signed   By: Abelardo Diesel M.D.   On: 08/18/2019 15:51   Result Date: 08/18/2019 CLINICAL DATA:  Recent diagnosis of right breast cancer. Abnormal mass seen on MRI in the left breast for biopsy. EXAM: MRI GUIDED CORE NEEDLE BIOPSY OF THE left BREAST TECHNIQUE: Multiplanar, multisequence MR imaging  of the left breast was performed both before and after administration of intravenous contrast. CONTRAST:  6 ml Gadavist COMPARISON:  Previous exams. FINDINGS: I met with the patient, and we discussed the procedure of MRI guided biopsy, including risks, benefits, and alternatives. Specifically, we discussed the risks of infection, bleeding, tissue injury, clip migration, and inadequate sampling. Informed, written consent was given. The usual time out protocol was performed immediately prior to the procedure. Using sterile technique, 1% Lidocaine, MRI guidance, and a 9 gauge vacuum assisted device, biopsy was performed of mass at left breast 6 o'clock using a lateral approach. At the conclusion of the procedure, a barbell tissue marker clip was deployed into the biopsy cavity. Follow-up 2-view mammogram was performed and dictated separately. IMPRESSION: MRI guided biopsy of left breast.  No apparent complications. Electronically Signed: By: Abelardo Diesel  M.D. On: 08/15/2019 08:25   [place] Rt Breast Bx W Loc Dev 1st Lesion Img Bx Spec US Guide  Addendum Date: 07/29/2019   ADDENDUM REPORT: 07/29/2019 12:36 ADDENDUM: PATHOLOGY revealed: RIGHT breast mass at 7:30 o'clock, 5 cm from nipple- Invasive Mammary Carcinoma, with focal squamous differentiation. RIGHT axillary node- positive for metastatic carcinoma; measuring at least 7.5 mm in greatest extent, with extranodal extension. Pathology results are CONCORDANT with imaging findings, per Dr. Dorise Bullion. Pathology results were discussed with patient via telephone. The patient reported doing well after the biopsy with tenderness at the site. Post biopsy care instructions were reviewed and questions were answered. The patient was encouraged to call [facility] for any additional concerns. Recommendation: Surgical referral. Request for surgical referral was relayed to nurse navigators at [facility] by Electa Sniff RN on 07/28/2019. Addendum  by Electa Sniff RN on 07/29/2019. Electronically Signed   By: Dorise Bullion III M.D   On: 07/29/2019 12:36   Result Date: 07/29/2019 CLINICAL DATA:  Biopsy right breast mass and abnormal right axillary node. EXAM: ULTRASOUND GUIDED RIGHT BREAST CORE NEEDLE BIOPSY COMPARISON:  Previous exam(s). FINDINGS: I met with the patient and we discussed the procedure of ultrasound-guided biopsy, including benefits and alternatives. We discussed the high likelihood of a successful procedure. We discussed the risks of the procedure, including infection, bleeding, tissue injury, clip migration, and inadequate sampling. Informed written consent was given. The usual time-out protocol was performed immediately prior to the procedure. Lesion quadrant: Lower-outer Using sterile technique and 1% Lidocaine as local anesthetic, under direct ultrasound visualization, a 12 gauge spring-loaded device was used to perform biopsy of the 730 right breast mass using a medial approach. At the conclusion of the procedure a coil shaped tissue marker clip was deployed into the biopsy cavity. Follow up 2 view mammogram was performed and dictated separately. Lesion quadrant: Right axilla Using sterile technique and 1% Lidocaine as local anesthetic, under direct ultrasound visualization, a 14 gauge spring-loaded device was used to perform biopsy of a right axillary lymph node using a lateral approach. At the conclusion of the procedure a HydroMARK tissue marker clip was deployed into the biopsy cavity. Follow up 2 view mammogram was performed and dictated separately. IMPRESSION: Ultrasound guided biopsy of a right breast mass and a right axillary lymph node. No apparent complications. Electronically Signed: By: Dorise Bullion III M.D On: 07/24/2019 14:22   [place] Rt Breast Bx W Loc Dev Ea Add Lesion Img Bx Spec US Guide  Addendum Date: 07/29/2019   ADDENDUM REPORT: 07/29/2019 12:36 ADDENDUM: PATHOLOGY revealed: RIGHT breast mass at 7:30 o'clock, 5 cm  from nipple- Invasive Mammary Carcinoma, with focal squamous differentiation. RIGHT axillary node- positive for metastatic carcinoma; measuring at least 7.5 mm in greatest extent, with extranodal extension. Pathology results are CONCORDANT with imaging findings, per Dr. Dorise Bullion. Pathology results were discussed with patient via telephone. The patient reported doing well after the biopsy with tenderness at the site. Post biopsy care instructions were reviewed and questions were answered. The patient was encouraged to call [facility] for any additional concerns. Recommendation: Surgical referral. Request for surgical referral was relayed to nurse navigators at [facility] by Electa Sniff RN on 07/28/2019. Addendum by Electa Sniff RN on 07/29/2019. Electronically Signed   By: Dorise Bullion III M.D   On: 07/29/2019 12:36   Result Date: 07/29/2019 CLINICAL DATA:  Biopsy right breast mass and abnormal right axillary node. EXAM: ULTRASOUND  GUIDED RIGHT BREAST CORE NEEDLE BIOPSY COMPARISON:  Previous exam(s). FINDINGS: I met with the patient and we discussed the procedure of ultrasound-guided biopsy, including benefits and alternatives. We discussed the high likelihood of a successful procedure. We discussed the risks of the procedure, including infection, bleeding, tissue injury, clip migration, and inadequate sampling. Informed written consent was given. The usual time-out protocol was performed immediately prior to the procedure. Lesion quadrant: Lower-outer Using sterile technique and 1% Lidocaine as local anesthetic, under direct ultrasound visualization, a 12 gauge spring-loaded device was used to perform biopsy of the 730 right breast mass using a medial approach. At the conclusion of the procedure a coil shaped tissue marker clip was deployed into the biopsy cavity. Follow up 2 view mammogram was performed and dictated separately. Lesion quadrant: Right axilla Using sterile  technique and 1% Lidocaine as local anesthetic, under direct ultrasound visualization, a 14 gauge spring-loaded device was used to perform biopsy of a right axillary lymph node using a lateral approach. At the conclusion of the procedure a HydroMARK tissue marker clip was deployed into the biopsy cavity. Follow up 2 view mammogram was performed and dictated separately. IMPRESSION: Ultrasound guided biopsy of a right breast mass and a right axillary lymph node. No apparent complications. Electronically Signed: By: Dorise Bullion III M.D On: 07/24/2019 14:22     Assessment and plan- Patient is a 63 y.o. 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  She is here for toxicity check after cycle 1 of neoadjuvant [facility] Keytruda  Patient seems to have tolerated her chemotherapy well.  She has mild nausea after chemotherapy which was self-limited.  No appreciable change in the size of her breast mass so far.  I will see her back on 09/01/2019 with CBC with differential, CMP for cycle 2.    I counseled the patient regarding her problems about sexual life.  I have encouraged her to have a frank conversation with her partner.  Feeling fatigued during chemotherapy and lack of sexual desire is not uncommon.  She should not force herself into sexual intercourse if she is not interested in going through it presently.  Patient states that she does not have anybody other than her significant other close by.  She does have a son who is busy with his job.  I did encourage her to speak to her son for social support while she is going to treatment for breast cancer.  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  She could technically undergo lumpectomy for this.  However patient is leaning towards bilateral mastectomy without reconstruction and will discuss this further with [name]       Total face to face encounter time for this patient visit was 30 min. >50% of the time was  spent in counseling and coordination of care.   Visit Diagnosis 1. Malignant neoplasm of lower-outer quadrant of right breast of female, estrogen receptor negative ([place])      Dr. Randa Evens, MD, MPH [facility] [phone] 08/22/2019 12:48 PM

## 2019-08-25 DIAGNOSIS — R0602 Shortness of breath: Secondary | ICD-10-CM | POA: Diagnosis not present

## 2019-08-26 ENCOUNTER — Telehealth: Payer: Self-pay | Admitting: *Deleted

## 2019-08-26 NOTE — Telephone Encounter (Signed)
Ok to take azo with chemo

## 2019-08-26 NOTE — Telephone Encounter (Signed)
Patient called reporting that she intermittently has to take AZO for bladder problems and wants to know if it OK to take this while on chemotherapy. Please advise

## 2019-08-26 NOTE — Telephone Encounter (Signed)
Called pt and let her know that she can take AZO while she is on chemo and that Barnabas Lister says she just need bills to add up to 6, 000. It does not matter the amount

## 2019-08-27 ENCOUNTER — Encounter: Payer: Self-pay | Admitting: Licensed Clinical Social Worker

## 2019-08-27 ENCOUNTER — Telehealth: Payer: Self-pay | Admitting: Licensed Clinical Social Worker

## 2019-08-27 ENCOUNTER — Ambulatory Visit: Payer: Self-pay | Admitting: Licensed Clinical Social Worker

## 2019-08-27 DIAGNOSIS — Z171 Estrogen receptor negative status [ER-]: Secondary | ICD-10-CM

## 2019-08-27 DIAGNOSIS — Z800 Family history of malignant neoplasm of digestive organs: Secondary | ICD-10-CM

## 2019-08-27 DIAGNOSIS — Z806 Family history of leukemia: Secondary | ICD-10-CM

## 2019-08-27 DIAGNOSIS — C50511 Malignant neoplasm of lower-outer quadrant of right female breast: Secondary | ICD-10-CM

## 2019-08-27 DIAGNOSIS — Z1379 Encounter for other screening for genetic and chromosomal anomalies: Secondary | ICD-10-CM

## 2019-08-27 DIAGNOSIS — Z8041 Family history of malignant neoplasm of ovary: Secondary | ICD-10-CM

## 2019-08-27 DIAGNOSIS — Z803 Family history of malignant neoplasm of breast: Secondary | ICD-10-CM

## 2019-08-27 NOTE — Progress Notes (Signed)
HPI:  Debra Little was previously seen in the [place] clinic due to a personal and family history of cancer and concerns regarding a hereditary predisposition to cancer. Please refer to our prior cancer genetics clinic note for more information regarding our discussion, assessment and recommendations, at the time. Debra Little recent genetic test results were disclosed to her, as were recommendations warranted by these results. These results and recommendations are discussed in more detail below.  CANCER HISTORY:  Oncology History  Breast cancer ([place])  07/30/2019 Initial Diagnosis   Breast cancer ([place])   08/06/2019 Cancer Staging   Staging form: Breast, AJCC 8th Edition - Clinical stage from 08/06/2019: Stage IIIB (cT2, cN1, cM0, G3, ER-, PR-, HER2-) - Signed by Sindy Guadeloupe, MD on 08/07/2019   08/11/2019 -  Chemotherapy   The patient had DOXOrubicin (ADRIAMYCIN) chemo injection 106 mg, 60 mg/m2 = 106 mg, Intravenous,  Once, 1 of 4 cycles Administration: 106 mg (08/11/2019) palonosetron (ALOXI) injection 0.25 mg, 0.25 mg, Intravenous,  Once, 1 of 8 cycles Administration: 0.25 mg (08/11/2019) pegfilgrastim-cbqv (UDENYCA) injection 6 mg, 6 mg, Subcutaneous, Once, 1 of 4 cycles Administration: 6 mg (08/12/2019) CARBOplatin (PARAPLATIN) 200 mg in sodium chloride 0.9 % 100 mL chemo infusion, 200 mg (original dose ), Intravenous,  Once, 0 of 4 cycles Dose modification:   (Cycle 8), 200 mg (Cycle 8), 200 mg (Cycle 5), 200 mg (Cycle 5), 200 mg (Cycle 6) cyclophosphamide (CYTOXAN) 1,060 mg in sodium chloride 0.9 % 250 mL chemo infusion, 600 mg/m2 = 1,060 mg, Intravenous,  Once, 1 of 4 cycles PACLitaxel (TAXOL) 138 mg in sodium chloride 0.9 % 250 mL chemo infusion (</= '80mg'$ /m2), 80 mg/m2, Intravenous,  Once, 0 of 4 cycles pembrolizumab (KEYTRUDA) 200 mg in sodium chloride 0.9 % 50 mL chemo infusion, 200 mg (100 % of original dose 200 mg), Intravenous, Once, 1 of 8 cycles Dose modification:  200 mg (original dose 200 mg, Cycle 1, Reason: Provider Judgment) Administration: 200 mg (08/11/2019) fosaprepitant (EMEND) 150 mg, dexamethasone (DECADRON) 12 mg in sodium chloride 0.9 % 145 mL IVPB, , Intravenous,  Once, 1 of 4 cycles Administration:  (08/11/2019)  for chemotherapy treatment.    08/26/2019 Genetic Testing   Negative genetic testing. VUS in CHEK2 called c.556A>C identified on the [facility] Common Hereditary Cancers Panel. The Common Hereditary Cancers Panel offered by [facility] includes sequencing and/or deletion duplication testing of the following 48 genes: APC, ATM, AXIN2, BARD1, BMPR1A, BRCA1, BRCA2, BRIP1, CDH1, CDKN2A (p14ARF), CDKN2A (p16INK4a), CKD4, CHEK2, CTNNA1, DICER1, EPCAM (Deletion/duplication testing only), GREM1 (promoter region deletion/duplication testing only), KIT, MEN1, MLH1, MSH2, MSH3, MSH6, MUTYH, NBN, NF1, NHTL1, PALB2, PDGFRA, PMS2, POLD1, POLE, PTEN, RAD50, RAD51C, RAD51D, RNF43, SDHB, SDHC, SDHD, SMAD4, SMARCA4. STK11, TP53, TSC1, TSC2, and VHL.  The following genes were evaluated for sequence changes only: SDHA and HOXB13 c.251G>A variant only. The report date is 08/26/2019.      FAMILY HISTORY:  We obtained a detailed, 4-generation family history.  Significant diagnoses are listed below: Family History  Problem Relation Age of Onset   Breast cancer Mother 59   Hypertension Mother    Colon cancer Father 38   Hyperlipidemia Father    Ovarian cancer Maternal Aunt 35   Breast cancer Cousin    Gallbladder disease Paternal Grandmother    Liver cancer Cousin 48       Malignant   Cancer Maternal Uncle        unsure type  Debra Little has one son, age 50, no history of cancer. She had one brother who died in 04-03-2023 due to [place] at 51, he did not have a history of cancer.  Debra Little mother was diagnosed with breast cancer at 48, she died at 72. Patient had 3 maternal uncles, 4 maternal aunts. One of her uncles had cancer but she is unsure  of the type. An aunt had ovarian cancer diagnosed at 85 and died at 36. Patient has one maternal cousin who had leukemia, no other known cancers in cousins. Her maternal grandmother died in her 40s, maternal grandfather died in his 66s.   Debra Little father was diagnosed with colon cancer in his late 66s and died at 42. Patient had 1 paternal aunt, no history of cancer and no children. Patient's maternal grandmother had gallbladder cancer and died at 25. Patient's maternal grandfather died at 61. Patient's father has a paternal cousin who had stomach cancer, and 2 maternal cousins with breast cancer.   Debra Little is unaware of previous family history of genetic testing for hereditary cancer risks. Patient's maternal ancestors are of unknown descent, and paternal ancestors are of unknown descent. There is no reported Ashkenazi Jewish ancestry. There is no known consanguinity.   GENETIC TEST RESULTS: Genetic testing reported out on 08/26/2019 through the [facility] Common Hereditary cancer panel found no pathogenic mutations. The Common Hereditary Cancers Panel offered by [facility] includes sequencing and/or deletion duplication testing of the following 48 genes: APC, ATM, AXIN2, BARD1, BMPR1A, BRCA1, BRCA2, BRIP1, CDH1, CDKN2A (p14ARF), CDKN2A (p16INK4a), CKD4, CHEK2, CTNNA1, DICER1, EPCAM (Deletion/duplication testing only), GREM1 (promoter region deletion/duplication testing only), KIT, MEN1, MLH1, MSH2, MSH3, MSH6, MUTYH, NBN, NF1, NHTL1, PALB2, PDGFRA, PMS2, POLD1, POLE, PTEN, RAD50, RAD51C, RAD51D, RNF43, SDHB, SDHC, SDHD, SMAD4, SMARCA4. STK11, TP53, TSC1, TSC2, and VHL.  The following genes were evaluated for sequence changes only: SDHA and HOXB13 c.251G>A variant only.. The test report has been scanned into EPIC and is located under the Molecular Pathology section of the Results Review tab.  A portion of the result report is included below for reference.     We discussed with Debra Little that  because current genetic testing is not perfect, it is possible there may be a gene mutation in one of these genes that current testing cannot detect, but that chance is small.  We also discussed, that there could be another gene that has not yet been discovered, or that we have not yet tested, that is responsible for the cancer diagnoses in the family. It is also possible there is a hereditary cause for the cancer in the family that Debra Little did not inherit and therefore was not identified in her testing.  Therefore, it is important to remain in touch with cancer genetics in the future so that we can continue to offer Debra Little the most up to date genetic testing.   Genetic testing did identify a variant of uncertain significance (VUS) was identified in the CHEK2 gene called c.556A>C.  At this time, it is unknown if this variant is associated with increased cancer risk or if this is a normal finding, but most variants such as this get reclassified to being inconsequential. It should not be used to make medical management decisions. With time, we suspect the lab will determine the significance of this variant, if any. If we do learn more about it, we will try to contact Debra Little to discuss it further. However, it is important to stay  in touch with [place] periodically and keep the address and phone number up to date.  ADDITIONAL GENETIC TESTING: We discussed with Debra Little that her genetic testing was fairly extensive.  If there are genes identified to increase cancer risk that can be analyzed in the future, we would be happy to discuss and coordinate this testing at that time.    CANCER SCREENING RECOMMENDATIONS: Debra Little test result is considered negative (normal).  This means that we have not identified a hereditary cause for her  personal and family history of cancer at this time. Most cancers happen by chance and this negative test suggests that her cancer may fall into this category.    While  reassuring, this does not definitively rule out a hereditary predisposition to cancer. It is still possible that there could be genetic mutations that are undetectable by current technology. There could be genetic mutations in genes that have not been tested or identified to increase cancer risk.  Therefore, it is recommended she continue to follow the cancer management and screening guidelines provided by her oncology and primary healthcare provider.   An individual's cancer risk and medical management are not determined by genetic test results alone. Overall cancer risk assessment incorporates additional factors, including personal medical history, family history, and any available genetic information that may result in a personalized plan for cancer prevention and surveillance.  RECOMMENDATIONS FOR FAMILY MEMBERS:  Relatives in this family might be at some increased risk of developing cancer, over the general population risk, simply due to the family history of cancer.  We recommended female relatives in this family have a yearly mammogram beginning at age 45, or 43 years younger than the earliest onset of cancer, an annual clinical breast exam, and perform monthly breast self-exams. Female relatives in this family should also have a gynecological exam as recommended by their primary provider. All family members should have a colonoscopy by age 41, or as directed by their physicians.  It is also possible there is a hereditary cause for the cancer in Debra Little family that she did not inherit and therefore was not identified in her.  Based on Debra Little's family history, we recommended maternal relatives have genetic counseling and testing. Debra Little will let us know if we can be of any assistance in coordinating genetic counseling and/or testing for these family members.  FOLLOW-UP: Lastly, we discussed with Debra Little that cancer genetics is a rapidly advancing field and it is possible that new  genetic tests will be appropriate for her and/or her family members in the future. We encouraged her to remain in contact with cancer genetics on an annual basis so we can update her personal and family histories and let her know of advances in cancer genetics that may benefit this family.   Our contact number was provided. Ms. Murley questions were answered to her satisfaction, and she knows she is welcome to call us at anytime with additional questions or concerns.   Faith Rogue, MS, [facility] Genetic Counselor [email or web address] Phone: [phone]

## 2019-08-27 NOTE — Telephone Encounter (Signed)
Revealed negative genetic testing.  Revealed that a VUS in CHEK2 was identified. This normal result is reassuring and indicates that it is unlikely Debra Little's cancer is due to a hereditary cause.  It is unlikely that there is an increased risk of another cancer due to a mutation in one of these genes.  However, genetic testing is not perfect, and cannot definitively rule out a hereditary cause.  It will be important for her to keep in contact with genetics to learn if any additional testing may be needed in the future.

## 2019-08-28 ENCOUNTER — Encounter: Payer: Self-pay | Admitting: Oncology

## 2019-08-29 NOTE — Progress Notes (Signed)
No answer, message left on VM

## 2019-09-01 ENCOUNTER — Inpatient Hospital Stay: Payer: Medicare HMO | Attending: Oncology

## 2019-09-01 ENCOUNTER — Inpatient Hospital Stay (HOSPITAL_BASED_OUTPATIENT_CLINIC_OR_DEPARTMENT_OTHER): Payer: Medicare HMO | Admitting: Oncology

## 2019-09-01 ENCOUNTER — Other Ambulatory Visit: Payer: Self-pay | Admitting: *Deleted

## 2019-09-01 ENCOUNTER — Other Ambulatory Visit: Payer: Self-pay

## 2019-09-01 ENCOUNTER — Inpatient Hospital Stay: Payer: Medicare HMO

## 2019-09-01 ENCOUNTER — Encounter: Payer: Self-pay | Admitting: Oncology

## 2019-09-01 VITALS — BP 132/54 | HR 76 | Temp 98.1°F | Ht 67.0 in | Wt 149.0 lb

## 2019-09-01 DIAGNOSIS — Z800 Family history of malignant neoplasm of digestive organs: Secondary | ICD-10-CM | POA: Diagnosis not present

## 2019-09-01 DIAGNOSIS — Z8041 Family history of malignant neoplasm of ovary: Secondary | ICD-10-CM | POA: Insufficient documentation

## 2019-09-01 DIAGNOSIS — R5381 Other malaise: Secondary | ICD-10-CM | POA: Diagnosis not present

## 2019-09-01 DIAGNOSIS — Z5112 Encounter for antineoplastic immunotherapy: Secondary | ICD-10-CM | POA: Diagnosis not present

## 2019-09-01 DIAGNOSIS — C50511 Malignant neoplasm of lower-outer quadrant of right female breast: Secondary | ICD-10-CM | POA: Diagnosis not present

## 2019-09-01 DIAGNOSIS — Z7689 Persons encountering health services in other specified circumstances: Secondary | ICD-10-CM | POA: Insufficient documentation

## 2019-09-01 DIAGNOSIS — Z171 Estrogen receptor negative status [ER-]: Secondary | ICD-10-CM

## 2019-09-01 DIAGNOSIS — Z5111 Encounter for antineoplastic chemotherapy: Secondary | ICD-10-CM | POA: Insufficient documentation

## 2019-09-01 DIAGNOSIS — R42 Dizziness and giddiness: Secondary | ICD-10-CM | POA: Insufficient documentation

## 2019-09-01 DIAGNOSIS — M8580 Other specified disorders of bone density and structure, unspecified site: Secondary | ICD-10-CM | POA: Insufficient documentation

## 2019-09-01 DIAGNOSIS — R5383 Other fatigue: Secondary | ICD-10-CM | POA: Diagnosis not present

## 2019-09-01 DIAGNOSIS — Z79899 Other long term (current) drug therapy: Secondary | ICD-10-CM | POA: Diagnosis not present

## 2019-09-01 DIAGNOSIS — Z803 Family history of malignant neoplasm of breast: Secondary | ICD-10-CM | POA: Insufficient documentation

## 2019-09-01 DIAGNOSIS — E785 Hyperlipidemia, unspecified: Secondary | ICD-10-CM | POA: Diagnosis not present

## 2019-09-01 DIAGNOSIS — F419 Anxiety disorder, unspecified: Secondary | ICD-10-CM | POA: Diagnosis not present

## 2019-09-01 LAB — CBC WITH DIFFERENTIAL/PLATELET
Abs Immature Granulocytes: 0.17 10*3/uL — ABNORMAL HIGH (ref 0.00–0.07)
Basophils Absolute: 0.1 10*3/uL (ref 0.0–0.1)
Basophils Relative: 1 %
Eosinophils Absolute: 0 10*3/uL (ref 0.0–0.5)
Eosinophils Relative: 0 %
HCT: 33.3 % — ABNORMAL LOW (ref 36.0–46.0)
Hemoglobin: 10.9 g/dL — ABNORMAL LOW (ref 12.0–15.0)
Immature Granulocytes: 2 %
Lymphocytes Relative: 11 %
Lymphs Abs: 1 10*3/uL (ref 0.7–4.0)
MCH: 29.4 pg (ref 26.0–34.0)
MCHC: 32.7 g/dL (ref 30.0–36.0)
MCV: 89.8 fL (ref 80.0–100.0)
Monocytes Absolute: 1.3 10*3/uL — ABNORMAL HIGH (ref 0.1–1.0)
Monocytes Relative: 14 %
Neutro Abs: 7.2 10*3/uL (ref 1.7–7.7)
Neutrophils Relative %: 72 %
Platelets: 328 10*3/uL (ref 150–400)
RBC: 3.71 MIL/uL — ABNORMAL LOW (ref 3.87–5.11)
RDW: 13.5 % (ref 11.5–15.5)
WBC: 9.8 10*3/uL (ref 4.0–10.5)
nRBC: 0 % (ref 0.0–0.2)

## 2019-09-01 LAB — COMPREHENSIVE METABOLIC PANEL
ALT: 26 U/L (ref 0–44)
AST: 21 U/L (ref 15–41)
Albumin: 3.8 g/dL (ref 3.5–5.0)
Alkaline Phosphatase: 54 U/L (ref 38–126)
Anion gap: 9 (ref 5–15)
BUN: 15 mg/dL (ref 6–20)
CO2: 28 mmol/L (ref 22–32)
Calcium: 8.6 mg/dL — ABNORMAL LOW (ref 8.9–10.3)
Chloride: 103 mmol/L (ref 98–111)
Creatinine, Ser: 0.72 mg/dL (ref 0.44–1.00)
GFR calc Af Amer: >60 mL/min (ref >60)
GFR calc non Af Amer: >60 mL/min (ref >60)
Glucose, Bld: 114 mg/dL — ABNORMAL HIGH (ref 70–99)
Potassium: 3.8 mmol/L (ref 3.5–5.1)
Sodium: 140 mmol/L (ref 135–145)
Total Bilirubin: 0.4 mg/dL (ref 0.3–1.2)
Total Protein: 6.6 g/dL (ref 6.5–8.1)

## 2019-09-01 MED ORDER — DOXORUBICIN HCL CHEMO IV INJECTION 2 MG/ML
60.0000 mg/m2 | Freq: Once | INTRAVENOUS | Status: AC
  Administered 2019-09-01: 106 mg via INTRAVENOUS
  Filled 2019-09-01: qty 50

## 2019-09-01 MED ORDER — SODIUM CHLORIDE 0.9 % IV SOLN
1000.0000 mg | Freq: Once | INTRAVENOUS | Status: AC
  Administered 2019-09-01: 1000 mg via INTRAVENOUS
  Filled 2019-09-01: qty 50

## 2019-09-01 MED ORDER — HEPARIN SOD (PORK) LOCK FLUSH 100 UNIT/ML IV SOLN: 500.0000 [IU] | Freq: Once | INTRAVENOUS | Status: DC | PRN

## 2019-09-01 MED ORDER — SODIUM CHLORIDE 0.9 % IV SOLN
Freq: Once | INTRAVENOUS | Status: AC
  Administered 2019-09-01: 10:00:00 via INTRAVENOUS
  Filled 2019-09-01: qty 5

## 2019-09-01 MED ORDER — SODIUM CHLORIDE 0.9 % IV SOLN
Freq: Once | INTRAVENOUS | Status: AC
  Administered 2019-09-01: 10:00:00 via INTRAVENOUS
  Filled 2019-09-01: qty 250

## 2019-09-01 MED ORDER — HEPARIN SOD (PORK) LOCK FLUSH 100 UNIT/ML IV SOLN
500.0000 [IU] | Freq: Once | INTRAVENOUS | Status: AC
  Administered 2019-09-01: 13:00:00 500 [IU] via INTRAVENOUS
  Filled 2019-09-01: qty 5

## 2019-09-01 MED ORDER — SODIUM CHLORIDE 0.9% FLUSH
10.0000 mL | INTRAVENOUS | Status: DC | PRN
  Administered 2019-09-01: 08:00:00 10 mL via INTRAVENOUS
  Filled 2019-09-01: qty 10

## 2019-09-01 MED ORDER — SODIUM CHLORIDE 0.9 % IV SOLN
200.0000 mg | Freq: Once | INTRAVENOUS | Status: AC
  Administered 2019-09-01: 200 mg via INTRAVENOUS
  Filled 2019-09-01: qty 8

## 2019-09-01 MED ORDER — BUTALBITAL-APAP-CAFFEINE 50-325-40 MG PO TABS: 1.0000 | ORAL_TABLET | Freq: Four times a day (QID) | ORAL | 0 refills | Status: AC | PRN

## 2019-09-01 MED ORDER — PALONOSETRON HCL INJECTION 0.25 MG/5ML
0.2500 mg | Freq: Once | INTRAVENOUS | Status: AC
  Administered 2019-09-01: 0.25 mg via INTRAVENOUS
  Filled 2019-09-01: qty 5

## 2019-09-01 NOTE — Progress Notes (Signed)
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 Tollie Pizza in the past and has undergone breast biopsies which did not previously showed malignancy. More recently patient noted some red discoloration around her right perioral as well as a palpable mass which led to a diagnostic mammogram on the right side her prior mammogram in February 2020 was normal in August 2020 she was noted to have a 2.7 x 2 x 2.3 cm mass in her right breast along with 4 morphologically abnormal lymph nodes. She underwent breast biopsy as well as lymph node biopsy which showed grade 3 invasive mammary carcinoma. ER PR and HER-2/neunegative.Sections show high-grade invasive carcinoma with focal squamous differentiation and pinpoint keratin ideation. The features are concerning for metaplastic differentiation.[name] Lymphovascular invasion present. There was extranodal extension present on the lymph node biopsy.   Her family history is significant for breast cancer in her mother in her 58s. Father had colon cancer in his70s.Family history also significant for ovarian cancer in her maternal aunt.  MRI of bilateral breasts showed: Primary right breast mass was 3.5 x 2.7 x 3.4 cm. There were surrounding  numerous nodules consistent with satellite lesions. Extensive nodular non-mass enhancement throughout the right breast involving the upper and lower quadrants spanning 6.2 x 4.5 x 5.5 cm. Markedly enhancement of the left breast diffusely. Non-mass enhancement measures 1.9 x 2.4 cm. At least 3 abnormal lymph nodes in the right axilla. No abnormal left axillary lymph nodes.   PET CT scan showed hypermetabolic possible satellite nodule just cephalad to the right breast lesion. Equivocal inferior breast and left axillary nodal hypermetabolism. No evidence of extrathoracic hypermetabolic metastases. Lateral right breast primary with right axillary nodal metastases.   Interval history- she is contemplating moving to her friends place at [place]. No new concerns since her last visit  ECOG PS- 1 Pain scale- 0 Opioid associated constipation- no  Review of systems- Review of Systems  Constitutional: Negative for chills, fever, malaise/fatigue and weight loss.  HENT: Negative for congestion, ear discharge and nosebleeds.   Eyes: Negative for blurred vision.  Respiratory: Negative for cough, hemoptysis, sputum production, shortness of breath and wheezing.   Cardiovascular: Negative for chest pain, palpitations, orthopnea and claudication.  Gastrointestinal: Negative for abdominal pain, blood in stool, constipation, diarrhea, heartburn, melena, nausea and vomiting.  Genitourinary: Negative for dysuria, flank pain, frequency, hematuria and urgency.  Musculoskeletal: Negative for back pain, joint pain and myalgias.  Skin: Negative for rash.  Neurological: Negative for dizziness, tingling, focal weakness, seizures, weakness and headaches.  Endo/Heme/Allergies: Does not bruise/bleed easily.  Psychiatric/Behavioral: Negative for depression and suicidal ideas. The patient does not have insomnia.        Allergies  Allergen Reactions   Amitiza [Lubiprostone] Other (See Comments)    Overly  effective at higher dose   Bentyl [Dicyclomine Hcl]     Lack of  effect   Buspar [Buspirone] Other (See Comments)    Palpitations.    Celecoxib     REACTION: unspecified   Cortisone Other (See Comments)    flush   Doxycycline     Palpitations and chest pain   Mobic [Meloxicam]     Palpitations.  But can tolerate aleve   Nitrofurantoin Monohyd Macro    Penicillins     REACTION: rash   Sulfonamide Derivatives     REACTION: nausea   Zoloft [Sertraline Hcl]     nausea and diarrhea.        Past Medical History:  Diagnosis Date   Anxiety    sees Dr. [name]   Breast cancer [facility]) 2020   Cyst of breast    per [name]   Family history of breast cancer    Family history of colon cancer    Family history of leukemia    Family history of ovarian cancer    Fibromyalgia    GERD (gastroesophageal reflux disease)    Heart murmur    Hemorrhoids    Herpes, genital 04/2015   confirmed with HSV 2 IgG   Hiatal hernia    Hypercholesterolemia    Dr. Kyra Searles   Hyperlipidemia    IBS (irritable bowel syndrome)    constipation predominant   IC (interstitial cystitis)    per [facility]   Mild depression ([place])    MVP (mitral valve prolapse)    [facility] cards eval 2012   Osteopenia    2010/2017, DEXA at [facility];spine and fem neck   Vitamin D deficiency    history of     Past Surgical History:  Procedure Laterality Date   ABDOMINAL HYSTERECTOMY     APPENDECTOMY     BLADDER SURGERY     1980's   BREAST EXCISIONAL BIOPSY Right    benign   BREAST EXCISIONAL BIOPSY Bilateral    benign   COLONOSCOPY  09/2014   Dr. [name]   COLONOSCOPY  2009   at [facility] 1 POLYP (BENIGN)   excision of breast cysts     hx of multiple cyst aspirations   EXPLORATORY LAPAROTOMY     PORTA CATH INSERTION N/A 08/06/2019   Procedure: PORTA CATH INSERTION;  Surgeon: Katha Cabal, MD;  Location: [facility];  Service: Cardiovascular;  Laterality: N/A;     PORTACATH PLACEMENT Left 08/05/2019   Procedure: INSERTION PORT-A-CATH, Attempted;  Surgeon: Jules Husbands, MD;  Location: [facility];  Service: General;  Laterality: Left;   TUBAL LIGATION      Social History   Socioeconomic History   Marital status: Divorced    Spouse name: Not on file   Number of children: 1   Years of education: Not on file   Highest education level: Not on file  Occupational History   Occupation: [employer] resource strain: Not on file   Food insecurity    Worry: Not on file    Inability: Not on file   Transportation needs    Medical: Not on file    Non-medical: Not on file  Tobacco Use   Smoking status: Never Smoker   Smokeless tobacco: Never Used  Substance and Sexual Activity   Alcohol use: No    Alcohol/week: 0.0 standard drinks   Drug use: No   Sexual activity: Yes    Birth control/protection: Surgical    Comment: Hysterectomy  Lifestyle   Physical activity    Days per week:  Not on file    Minutes per session: Not on file   Stress: Not on file  Relationships   Social connections    Talks on phone: Not on file    Gets together: Not on file    Attends religious service: Not on file    Active member of club or organization: Not on file    Attends meetings of clubs or organizations: Not on file    Relationship status: Not on file   Intimate partner violence    Fear of current or ex partner: Not on file    Emotionally abused: Not on file    Physically abused: Not on file    Forced sexual activity: Not on file  Other Topics Concern   Not on file  Social History Narrative   Married in 1997-02-01, divorced as of 2017-02-01, 1 son from previous relationship   Brother with MVA at 78, in rest home for many years as of 02-01-2018- died of covid recently    Family History  Problem Relation Age of Onset   Breast cancer Mother 64   Hypertension Mother    Colon cancer Father 67   Hyperlipidemia Father    Ovarian  cancer Maternal Aunt 80   Breast cancer Cousin    Gallbladder disease Paternal Grandmother    Liver cancer Cousin 9       Malignant   Cancer Maternal Uncle        unsure type     Current Outpatient Medications:    acetaminophen (TYLENOL) 500 MG tablet, Take 1,000 mg by mouth every 6 (six) hours as needed for moderate pain., Disp: , Rfl:    ALPRAZolam (XANAX) 0.5 MG tablet, Take 1 tablet (0.5 mg total) by mouth as needed for anxiety. Take 1-2 tablets as needed prior to your MRI, Disp: 2 tablet, Rfl: 0   Ascorbic Acid (VITAMIN C) 100 MG tablet, Take 100 mg by mouth daily., Disp: , Rfl:    atorvastatin (LIPITOR) 40 MG tablet, Take 40 mg by mouth daily., Disp: , Rfl: 1   Cholecalciferol (VITAMIN D) 125 MCG (5000 UT) CAPS, Take 5,000 Units by mouth daily. , Disp: , Rfl:    clonazePAM (KLONOPIN) 0.5 MG tablet, Take 0.5 mg by mouth 2 (two) times daily. , Disp: , Rfl:    dexamethasone (DECADRON) 4 MG tablet, Take 2 tablets by mouth daily starting the day after Carboplatin and Cytoxan x 3 days. Take with food., Disp: 30 tablet, Rfl: 1   DULoxetine (CYMBALTA) 60 MG capsule, Take 60 mg by mouth daily. , Disp: , Rfl:    gabapentin (NEURONTIN) 100 MG capsule, Take 1-2 capsules (100-200 mg total) by mouth 3 (three) times daily as needed. (Patient taking differently: Take 100 mg by mouth at bedtime. ), Disp: 90 capsule, Rfl: 1   HYDROcodone-acetaminophen (NORCO/VICODIN) 5-325 MG tablet, Take 1 tablet by mouth every 6 (six) hours as needed for moderate pain., Disp: 15 tablet, Rfl: 0   lidocaine-prilocaine (EMLA) cream, Apply to affected area once, Disp: 30 g, Rfl: 3   loratadine (CLARITIN) 10 MG tablet, Take 1 tablet (10 mg total) by mouth daily., Disp: , Rfl:    omeprazole (PRILOSEC) 20 MG capsule, Take 20 mg by mouth daily. , Disp: , Rfl:    ondansetron (ZOFRAN) 4 MG tablet, Take 1 tablet (4 mg total) by mouth every 8 (eight) hours as needed for nausea or vomiting., Disp: 20 tablet, Rfl:  0   ondansetron (ZOFRAN) 8 MG tablet, Take  1 tablet (8 mg total) by mouth 2 (two) times daily as needed. Start on the third day after chemotherapy., Disp: 30 tablet, Rfl: 1   Plecanatide (TRULANCE) 3 MG TABS, Take 3 mg by mouth every other day. , Disp: , Rfl:    prochlorperazine (COMPAZINE) 10 MG tablet, Take 1 tablet (10 mg total) by mouth every 6 (six) hours as needed (Nausea or vomiting)., Disp: 30 tablet, Rfl: 1   sennosides-docusate sodium (SENOKOT-S) 8.6-50 MG tablet, Take 1 tablet by mouth daily. Takes every other day, Disp: , Rfl:    valACYclovir (VALTREX) 500 MG tablet, Take 500 mg by mouth daily., Disp: , Rfl:    vitamin B-12 (CYANOCOBALAMIN) 500 MCG tablet, Take 500 mcg by mouth daily., Disp: , Rfl:    LORazepam (ATIVAN) 0.5 MG tablet, Take 1 tablet (0.5 mg total) by mouth every 6 (six) hours as needed (Nausea or vomiting). (Patient not taking: Reported on 09/01/2019), Disp: 30 tablet, Rfl: 0 No current facility-administered medications for this visit.   Facility-Administered Medications Ordered in Other Visits:    heparin lock flush 100 unit/mL, 500 Units, Intravenous, Once, Sindy Guadeloupe, MD   sodium chloride flush (NS) 0.9 % injection 10 mL, 10 mL, Intravenous, PRN, Sindy Guadeloupe, MD, 10 mL at 09/01/19 0821  Physical exam:  Vitals:   09/01/19 0843  BP: (!) 132/54  Pulse: 76  Temp: 98.1 F (36.7 C)  TempSrc: Tympanic  Weight: 149 lb (67.6 kg)  Height: '5\' 7"'$  (1.702 m)   Physical Exam HENT:     Head: Normocephalic and atraumatic.  Eyes:     Pupils: Pupils are equal, round, and reactive to light.  Neck:     Musculoskeletal: Normal range of motion.  Cardiovascular:     Rate and Rhythm: Normal rate and regular rhythm.     Heart sounds: Normal heart sounds.  Pulmonary:     Effort: Pulmonary effort is normal.     Breath sounds: Normal breath sounds.  Abdominal:     General: Bowel sounds are normal.     Palpations: Abdomen is soft.  Skin:    General: Skin is  warm and dry.  Neurological:     Mental Status: She is alert and oriented to person, place, and time.    right breast mass remains unchanged about 5 cm in size. No palpable right axillary adenopathy  CMP Latest Ref Rng & Units 08/11/2019  Glucose 70 - 99 mg/dL 127(H)  BUN 6 - 20 mg/dL 17  Creatinine 0.44 - 1.00 mg/dL 0.81  Sodium 135 - 145 mmol/L 138  Potassium 3.5 - 5.1 mmol/L 3.7  Chloride 98 - 111 mmol/L 102  CO2 22 - 32 mmol/L 29  Calcium 8.9 - 10.3 mg/dL 8.9  Total Protein 6.5 - 8.1 g/dL 7.1  Total Bilirubin 0.3 - 1.2 mg/dL 0.6  Alkaline Phos 38 - 126 U/L 70  AST 15 - 41 U/L 16  ALT 0 - 44 U/L 13   CBC Latest Ref Rng & Units 09/01/2019  WBC 4.0 - 10.5 K/uL 9.8  Hemoglobin 12.0 - 15.0 g/dL 10.9(L)  Hematocrit 36.0 - 46.0 % 33.3(L)  Platelets 150 - 400 K/uL 328    No images are attached to the encounter.  Nm Pet Image Initial (pi) Skull Base To Thigh  Result Date: 08/06/2019 CLINICAL DATA:  Initial treatment strategy for right-sided breast cancer. Attempted left Port-A-Cath yesterday. EXAM: NUCLEAR MEDICINE PET SKULL BASE TO THIGH TECHNIQUE: 7.5 mCi F-18 FDG was injected intravenously.  Full-ring PET imaging was performed from the skull base to thigh after the radiotracer. CT data was obtained and used for attenuation correction and anatomic localization. Fasting blood glucose: 89 mg/dl COMPARISON:  Breast MR of 07/31/2019. FINDINGS: Mediastinal blood pool activity: SUV max 2.3 Liver activity: SUV max NA NECK: No areas of abnormal hypermetabolism. Incidental CT findings: No cervical adenopathy. Presumably procedure related left-sided cervical edema is mild, including on 42/4. 1.0 cm nonspecific anterior left thyroid nodule on 55/4. CHEST: Posterior left axillary node measures 6 mm and a S.U.V. max of 2.6 on 69/4. Right axillary nodes measure maximally 1.0 cm and a S.U.V. max of 6.1 on 75/4. Lateral right breast primary measures on the order of 2.6 cm and a S.U.V. max of 13.7 on image  105/4. Just cephalad to this, a nodule measures 8 mm and a S.U.V. max of 3.4 on 98/4. Inferior left breast hypermetabolism is without well-defined correlate mass. Measures a S.U.V. max of 2.7, including on image 118/4. Incidental CT findings: Left chest wall and anterior mediastinal edema consistent with failed left Port-A-Cath placement. No well-defined hematoma. Trace right pleural fluid or thickening. Left lower lobe interstitial thickening. ABDOMEN/PELVIS: No abdominopelvic parenchymal or nodal hypermetabolism. Incidental CT findings: Normal adrenal glands. Abdominal aortic atherosclerosis. Hysterectomy. Mild pelvic floor laxity. SKELETON: No abnormal marrow activity. Incidental CT findings: none IMPRESSION: 1. Hypermetabolic lateral right breast primary with right axillary nodal metastasis. Possible satellite nodule just cephalad to the lateral right breast lesion. 2. Equivocal inferior left breast and left axillary nodal hypermetabolism. Please see MRI report of 07/31/2019. 3. No evidence of extrathoracic hypermetabolic metastasis. 4. Sequelae of failed left Port-A-Cath, as on clinical history. Electronically Signed   By: Abigail Miyamoto M.D.   On: 08/06/2019 12:04   Dg Chest Port 1 View  Result Date: 08/05/2019 CLINICAL DATA:  Attempted Port-A-Cath EXAM: PORTABLE CHEST 1 VIEW COMPARISON:  05/25/2014 FINDINGS: Cardiomegaly. Both lungs are clear. The visualized skeletal structures are unremarkable. IMPRESSION: Cardiomegaly without acute abnormality of the lungs in AP portable projection. No pneumothorax or pleural. No unexpected radiopaque foreign body about the chest following attempted Port-A-Cath placement. Electronically Signed   By: Eddie Candle M.D.   On: 08/05/2019 10:50   Dg C-arm 1-60 Min-no Report  Result Date: 08/05/2019 Fluoroscopy was utilized by the requesting physician.  No radiographic interpretation.   Mm Clip Placement Left  Result Date: 08/15/2019 CLINICAL DATA:  Status post MRI guided  core biopsy of in the left breast. EXAM: DIAGNOSTIC left MAMMOGRAM POST MRI BIOPSY COMPARISON:  Previous exam(s). FINDINGS: Mammographic images were obtained following left breast guided biopsy of mass at left breast 6 position. Cc and lateral views of the left breast demonstrate barbell in the area of concern. IMPRESSION: Post biopsy mammogram demonstrating clip in the area concern. Final Assessment: Post Procedure Mammograms for Marker Placement Electronically Signed   By: Abelardo Diesel M.D.   On: 08/15/2019 10:20   Mr Aundra Millet Breast Bx Johnella Moloney Dev 1st Lesion Image Bx Spec Mr Guide  Addendum Date: 08/18/2019   ADDENDUM REPORT: 08/18/2019 15:51 ADDENDUM: Pathology revealed INTERMEDIATE GRADE DUCTAL CARCINOMA IN SITU of the LEFT breast, 6 o'clock. This was found to be concordant by Dr. Abelardo Diesel. Pathology results were discussed with the patient by telephone. The patient reported doing well after the biopsy with tenderness at the site. Post biopsy instructions and care were reviewed and questions were answered. The patient was encouraged to call [facility] for any additional concerns.  The patient has a recent diagnosis of RIGHT breast cancer and should follow her outlined treatment plan. Pathology results reported by Stacie Acres, RN on 08/18/2019. Electronically Signed   By: Abelardo Diesel M.D.   On: 08/18/2019 15:51   Result Date: 08/18/2019 CLINICAL DATA:  Recent diagnosis of right breast cancer. Abnormal mass seen on MRI in the left breast for biopsy. EXAM: MRI GUIDED CORE NEEDLE BIOPSY OF THE left BREAST TECHNIQUE: Multiplanar, multisequence MR imaging of the left breast was performed both before and after administration of intravenous contrast. CONTRAST:  6 ml Gadavist COMPARISON:  Previous exams. FINDINGS: I met with the patient, and we discussed the procedure of MRI guided biopsy, including risks, benefits, and alternatives. Specifically, we discussed the risks of infection,  bleeding, tissue injury, clip migration, and inadequate sampling. Informed, written consent was given. The usual time out protocol was performed immediately prior to the procedure. Using sterile technique, 1% Lidocaine, MRI guidance, and a 9 gauge vacuum assisted device, biopsy was performed of mass at left breast 6 o'clock using a lateral approach. At the conclusion of the procedure, a barbell tissue marker clip was deployed into the biopsy cavity. Follow-up 2-view mammogram was performed and dictated separately. IMPRESSION: MRI guided biopsy of left breast.  No apparent complications. Electronically Signed: By: Abelardo Diesel M.D. On: 08/15/2019 08:25     Assessment and plan- Patient is a 63 y.o. 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  She is here for on treatment assessment prior to cycle 2 of neoadjuvant AC Keytruda chemotherapy  Counts okay to proceed with cycle 2 of neoadjuvant TC Keytruda chemotherapy.  She will come back next day to receive udenyca. I will plan to get interim ultrasound after 2 cycles given that she has metaplastic tumor and I would like to make sure her tumor is not growing on chemo.  I will see her back in 3 weeks time with CBC with differential, CMP for cycle 3 of AC Keytruda chemotherapy.  Discussed the findings of left breast biopsy which were consistent with ER positive DCIS.  However patient is leaning towards a bilateral mastectomy and would therefore not need adjuvant hormone therapy for left breast DCIS unless there is an incidental invasive component found at the time of surgery  Patient did get headache that lasted for a day post chemotherapy.  I have given her PRN Fioricet that she can try as Tylenol and Motrin did not seem to help the last time    Visit Diagnosis 1. Encounter for antineoplastic chemotherapy   2. Encounter for antineoplastic immunotherapy   3.  Malignant neoplasm of lower-outer quadrant of right breast of female, estrogen receptor negative ([place])      Dr. Randa Evens, MD, MPH [facility] [phone] 09/01/2019 9:11 AM

## 2019-09-01 NOTE — Progress Notes (Unsigned)
[id number]

## 2019-09-01 NOTE — Progress Notes (Signed)
Patient stated that for the past two weeks she had been having right breast pain radiating to her back. Patient stated that she had been stressing lately.

## 2019-09-02 ENCOUNTER — Other Ambulatory Visit: Payer: Self-pay

## 2019-09-02 ENCOUNTER — Inpatient Hospital Stay: Payer: Medicare HMO

## 2019-09-02 DIAGNOSIS — C50511 Malignant neoplasm of lower-outer quadrant of right female breast: Secondary | ICD-10-CM | POA: Diagnosis not present

## 2019-09-02 DIAGNOSIS — R5381 Other malaise: Secondary | ICD-10-CM | POA: Diagnosis not present

## 2019-09-02 DIAGNOSIS — M8580 Other specified disorders of bone density and structure, unspecified site: Secondary | ICD-10-CM | POA: Diagnosis not present

## 2019-09-02 DIAGNOSIS — Z7689 Persons encountering health services in other specified circumstances: Secondary | ICD-10-CM | POA: Diagnosis not present

## 2019-09-02 DIAGNOSIS — Z171 Estrogen receptor negative status [ER-]: Secondary | ICD-10-CM | POA: Diagnosis not present

## 2019-09-02 DIAGNOSIS — E785 Hyperlipidemia, unspecified: Secondary | ICD-10-CM | POA: Diagnosis not present

## 2019-09-02 DIAGNOSIS — R5383 Other fatigue: Secondary | ICD-10-CM | POA: Diagnosis not present

## 2019-09-02 DIAGNOSIS — Z5111 Encounter for antineoplastic chemotherapy: Secondary | ICD-10-CM | POA: Diagnosis not present

## 2019-09-02 DIAGNOSIS — Z5112 Encounter for antineoplastic immunotherapy: Secondary | ICD-10-CM | POA: Diagnosis not present

## 2019-09-02 DIAGNOSIS — R42 Dizziness and giddiness: Secondary | ICD-10-CM | POA: Diagnosis not present

## 2019-09-02 MED ORDER — PEGFILGRASTIM-CBQV 6 MG/0.6ML ~~LOC~~ SOSY
6.0000 mg | PREFILLED_SYRINGE | Freq: Once | SUBCUTANEOUS | Status: AC
  Administered 2019-09-02: 6 mg via SUBCUTANEOUS

## 2019-09-09 ENCOUNTER — Telehealth: Payer: Self-pay | Admitting: *Deleted

## 2019-09-09 ENCOUNTER — Inpatient Hospital Stay (HOSPITAL_BASED_OUTPATIENT_CLINIC_OR_DEPARTMENT_OTHER): Payer: Medicare HMO | Admitting: Oncology

## 2019-09-09 ENCOUNTER — Encounter: Payer: Self-pay | Admitting: Oncology

## 2019-09-09 ENCOUNTER — Inpatient Hospital Stay: Payer: Medicare HMO

## 2019-09-09 ENCOUNTER — Other Ambulatory Visit: Payer: Self-pay

## 2019-09-09 VITALS — BP 116/61 | HR 85 | Temp 96.4°F | Resp 18

## 2019-09-09 DIAGNOSIS — R5383 Other fatigue: Secondary | ICD-10-CM | POA: Diagnosis not present

## 2019-09-09 DIAGNOSIS — Z5111 Encounter for antineoplastic chemotherapy: Secondary | ICD-10-CM | POA: Diagnosis not present

## 2019-09-09 DIAGNOSIS — C50511 Malignant neoplasm of lower-outer quadrant of right female breast: Secondary | ICD-10-CM

## 2019-09-09 DIAGNOSIS — Z171 Estrogen receptor negative status [ER-]: Secondary | ICD-10-CM

## 2019-09-09 DIAGNOSIS — Z7689 Persons encountering health services in other specified circumstances: Secondary | ICD-10-CM | POA: Diagnosis not present

## 2019-09-09 DIAGNOSIS — E785 Hyperlipidemia, unspecified: Secondary | ICD-10-CM | POA: Diagnosis not present

## 2019-09-09 DIAGNOSIS — M8580 Other specified disorders of bone density and structure, unspecified site: Secondary | ICD-10-CM | POA: Diagnosis not present

## 2019-09-09 DIAGNOSIS — Z5112 Encounter for antineoplastic immunotherapy: Secondary | ICD-10-CM | POA: Diagnosis not present

## 2019-09-09 DIAGNOSIS — R5381 Other malaise: Secondary | ICD-10-CM | POA: Diagnosis not present

## 2019-09-09 DIAGNOSIS — R42 Dizziness and giddiness: Secondary | ICD-10-CM | POA: Diagnosis not present

## 2019-09-09 LAB — COMPREHENSIVE METABOLIC PANEL
ALT: 18 U/L (ref 0–44)
AST: 15 U/L (ref 15–41)
Albumin: 3.6 g/dL (ref 3.5–5.0)
Alkaline Phosphatase: 71 U/L (ref 38–126)
Anion gap: 7 (ref 5–15)
BUN: 9 mg/dL (ref 8–23)
CO2: 29 mmol/L (ref 22–32)
Calcium: 8.8 mg/dL — ABNORMAL LOW (ref 8.9–10.3)
Chloride: 102 mmol/L (ref 98–111)
Creatinine, Ser: 0.85 mg/dL (ref 0.44–1.00)
GFR calc Af Amer: >60 mL/min (ref >60)
GFR calc non Af Amer: >60 mL/min (ref >60)
Glucose, Bld: 112 mg/dL — ABNORMAL HIGH (ref 70–99)
Potassium: 3.5 mmol/L (ref 3.5–5.1)
Sodium: 138 mmol/L (ref 135–145)
Total Bilirubin: 0.4 mg/dL (ref 0.3–1.2)
Total Protein: 6.3 g/dL — ABNORMAL LOW (ref 6.5–8.1)

## 2019-09-09 LAB — CBC WITH DIFFERENTIAL/PLATELET
Abs Immature Granulocytes: 0.3 10*3/uL — ABNORMAL HIGH (ref 0.00–0.07)
Band Neutrophils: 17 %
Basophils Absolute: 0.1 10*3/uL (ref 0.0–0.1)
Basophils Relative: 1 %
Eosinophils Absolute: 0.1 10*3/uL (ref 0.0–0.5)
Eosinophils Relative: 2 %
HCT: 31.7 % — ABNORMAL LOW (ref 36.0–46.0)
Hemoglobin: 10.1 g/dL — ABNORMAL LOW (ref 12.0–15.0)
Lymphocytes Relative: 26 %
Lymphs Abs: 1.4 10*3/uL (ref 0.7–4.0)
MCH: 29.3 pg (ref 26.0–34.0)
MCHC: 31.9 g/dL (ref 30.0–36.0)
MCV: 91.9 fL (ref 80.0–100.0)
Metamyelocytes Relative: 2 %
Monocytes Absolute: 0.8 10*3/uL (ref 0.1–1.0)
Monocytes Relative: 14 %
Myelocytes: 3 %
Neutro Abs: 2.9 10*3/uL (ref 1.7–7.7)
Neutrophils Relative %: 35 %
Platelets: 132 10*3/uL — ABNORMAL LOW (ref 150–400)
RBC: 3.45 MIL/uL — ABNORMAL LOW (ref 3.87–5.11)
RDW: 13.3 % (ref 11.5–15.5)
Smear Review: NORMAL
WBC Morphology: 6
WBC: 5.5 10*3/uL (ref 4.0–10.5)
nRBC: 0 % (ref 0.0–0.2)

## 2019-09-09 LAB — SAMPLE TO BLOOD BANK

## 2019-09-09 NOTE — Telephone Encounter (Signed)
Called pt  And she is weak and heart rate and gets sob and feels sweaty.  Dr. Janese Banks states that she can come today and have labs and be seen. Pt agreeable and will be here in 15 min.

## 2019-09-09 NOTE — Telephone Encounter (Signed)
Patient called reporting that she had chemotherapy last Monday and that she is feeling very weak, and when she gets up to walk, her heart races and she gets sweaty. Please advise

## 2019-09-09 NOTE — Progress Notes (Signed)
Pt c/o SOB, weakness, sweating, shakiness, darkness in vision when going from sitting to standing, irritation in esophagus.

## 2019-09-10 ENCOUNTER — Telehealth: Payer: Self-pay | Admitting: *Deleted

## 2019-09-10 MED ORDER — MAGIC MOUTHWASH W/LIDOCAINE: 5.0000 mL | Freq: Four times a day (QID) | ORAL | 3 refills | Status: DC

## 2019-09-10 NOTE — Telephone Encounter (Signed)
Patient called reporting that she was to have a mouthwash prescription sent to pharmacy an dit is not there. Please advise

## 2019-09-10 NOTE — Telephone Encounter (Signed)
Renita Papa, RN is going to send preprinted prescription to [facility]

## 2019-09-10 NOTE — Telephone Encounter (Signed)
Can you please send mgic mouthwash with lidocaine?

## 2019-09-11 ENCOUNTER — Ambulatory Visit
Admission: RE | Admit: 2019-09-11 | Discharge: 2019-09-11 | Disposition: A | Discharge status: Home / Self Care | Payer: Medicare HMO | Source: Ambulatory Visit | Attending: Oncology | Admitting: Oncology

## 2019-09-11 DIAGNOSIS — Z853 Personal history of malignant neoplasm of breast: Secondary | ICD-10-CM | POA: Diagnosis not present

## 2019-09-11 DIAGNOSIS — C50511 Malignant neoplasm of lower-outer quadrant of right female breast: Secondary | ICD-10-CM | POA: Diagnosis not present

## 2019-09-11 DIAGNOSIS — N6313 Unspecified lump in the right breast, lower outer quadrant: Secondary | ICD-10-CM | POA: Diagnosis not present

## 2019-09-11 DIAGNOSIS — Z171 Estrogen receptor negative status [ER-]: Secondary | ICD-10-CM | POA: Insufficient documentation

## 2019-09-12 NOTE — Progress Notes (Signed)
Hematology/Oncology Consult note [facility]  [phone] [phone]  Patient Care Team: Joaquim Nam, MD as PCP - General (Family Medicine) Lemar Livings, Merrily Pew, MD (General Surgery) Arta Silence, MD (Family Medicine) Hulan Fray as Physician Assistant (Urology) Dalia Heading, MD as Consulting Physician (Cardiology)   Name of the patient: Debra Little  [id number]  12-05-1955   Date of visit: 09/12/19  Diagnosis- clinical prognostic stage IIIb triple negative breast cancer with metaplastic features cT2 cN1 M0   Chief complaint/ Reason for visit-acute visit for ongoing fatigue after chemotherapy  Heme/Onc history: Patient is a 63 year old female with seen Dr. Doristine Counter in the past and has undergone breast biopsies which did not previously showed malignancy. More recently patient noted some red discoloration around her right perioral as well as a palpable mass which led to a diagnostic mammogram on the right side her prior mammogram in February 2020 was normal in August 2020 she was noted to have a 2.7 x 2 x 2.3 cm mass in her right breast along with 4 morphologically abnormal lymph nodes. She underwent breast biopsy as well as lymph node biopsy which showed grade 3 invasive mammary carcinoma. ER PR and HER-2/neunegative.Sections show high-grade invasive carcinoma with focal squamous differentiation and pinpoint keratin ideation. The features are concerning for metaplastic differentiation.[name] Lymphovascular invasion present. There was extranodal extension present on the lymph node biopsy.   Her family history is significant for breast cancer in her mother in her 89s. Father had colon cancer in his70s.Family history also significant for ovarian cancer in her maternal aunt.  MRI of bilateral breasts showed: Primary right breast mass was 3.5 x 2.7 x 3.4 cm. There were surrounding numerous nodules consistent  with satellite lesions. Extensive nodular non-mass enhancement throughout the right breast involving the upper and lower quadrants spanning 6.2 x 4.5 x 5.5 cm. Markedly enhancement of the left breast diffusely. Non-mass enhancement measures 1.9 x 2.4 cm. At least 3 abnormal lymph nodes in the right axilla. No abnormal left axillary lymph nodes.   PET CT scan showed hypermetabolic possible satellite nodule just cephalad to the right breast lesion. Equivocal inferior breast and left axillary nodal hypermetabolism. No evidence of extrathoracic hypermetabolic metastases. Lateral right breast primary with right axillary nodal metastases  Interval history-patient reports currently feeling fatigued after her chemotherapy.  She denies any fever, cough, shortness of breath, burning urination constipation or diarrhea.  She has been keeping up with her oral intake.  Reports having sense of palpitation when she walks around.  States that she gets tired easily  ECOG PS- 1 Pain scale- 0   Review of systems- Review of Systems  Constitutional: Positive for malaise/fatigue. Negative for chills, fever and weight loss.  HENT: Negative for congestion, ear discharge and nosebleeds.   Eyes: Negative for blurred vision.  Respiratory: Negative for cough, hemoptysis, sputum production, shortness of breath and wheezing.   Cardiovascular: Negative for chest pain, palpitations, orthopnea and claudication.  Gastrointestinal: Negative for abdominal pain, blood in stool, constipation, diarrhea, heartburn, melena, nausea and vomiting.  Genitourinary: Negative for dysuria, flank pain, frequency, hematuria and urgency.  Musculoskeletal: Negative for back pain, joint pain and myalgias.  Skin: Negative for rash.  Neurological: Negative for dizziness, tingling, focal weakness, seizures, weakness and headaches.  Endo/Heme/Allergies: Does not bruise/bleed easily.  Psychiatric/Behavioral: Negative for depression and  suicidal ideas. The patient does not have insomnia.     Allergies  Allergen Reactions   Amitiza [Lubiprostone]  Other (See Comments)    Overly effective at higher dose   Bentyl [Dicyclomine Hcl]     Lack of effect   Buspar [Buspirone] Other (See Comments)    Palpitations.    Celecoxib     REACTION: unspecified   Cortisone Other (See Comments)    flush   Doxycycline     Palpitations and chest pain   Mobic [Meloxicam]     Palpitations.  But can tolerate aleve   Nitrofurantoin Monohyd Macro    Penicillins     REACTION: rash   Sulfonamide Derivatives     REACTION: nausea   Zoloft [Sertraline Hcl]     nausea and diarrhea.        Past Medical History:  Diagnosis Date   Anxiety    sees Dr. Nolen Mu   Breast cancer [facility]) 2020   Cyst of breast    per Dr. Lemar Livings   Family history of breast cancer    Family history of colon cancer    Family history of leukemia    Family history of ovarian cancer    Fibromyalgia    GERD (gastroesophageal reflux disease)    Heart murmur    Hemorrhoids    Herpes, genital 04/2015   confirmed with HSV 2 IgG   Hiatal hernia    Hypercholesterolemia    Dr. Rushie Goltz   Hyperlipidemia    IBS (irritable bowel syndrome)    constipation predominant   IC (interstitial cystitis)    per [place] [facility]   Mild depression (HCC)    MVP (mitral valve prolapse)    [facility] cards eval 2012   Osteopenia    2010/2017, DEXA at [facility];spine and fem neck   Vitamin D deficiency    history of     Past Surgical History:  Procedure Laterality Date   ABDOMINAL HYSTERECTOMY     APPENDECTOMY     BLADDER SURGERY     1980's   BREAST EXCISIONAL BIOPSY Right    benign   BREAST EXCISIONAL BIOPSY Bilateral    benign   COLONOSCOPY  09/2014   Dr. Mechele Collin   COLONOSCOPY  2009   at [facility] 1 POLYP (BENIGN)   excision of breast cysts     hx of multiple cyst aspirations   EXPLORATORY LAPAROTOMY     PORTA CATH INSERTION N/A  08/06/2019   Procedure: PORTA CATH INSERTION;  Surgeon: Renford Dills, MD;  Location: [facility] INVASIVE CV LAB;  Service: Cardiovascular;  Laterality: N/A;   PORTACATH PLACEMENT Left 08/05/2019   Procedure: INSERTION PORT-A-CATH, Attempted;  Surgeon: Leafy Ro, MD;  Location: [facility] ORS;  Service: General;  Laterality: Left;   TUBAL LIGATION      Social History   Socioeconomic History   Marital status: Divorced    Spouse name: Not on file   Number of children: 1   Years of education: Not on file   Highest education level: Not on file  Occupational History   Occupation: [employer]  Ecologist strain: Not on file   Food insecurity    Worry: Not on file    Inability: Not on file   Transportation needs    Medical: Not on file    Non-medical: Not on file  Tobacco Use   Smoking status: Never Smoker   Smokeless tobacco: Never Used  Substance and Sexual Activity   Alcohol use: No    Alcohol/week: 0.0 standard drinks   Drug use: No   Sexual activity: Yes  Birth control/protection: Surgical    Comment: Hysterectomy  Lifestyle   Physical activity    Days per week: Not on file    Minutes per session: Not on file   Stress: Not on file  Relationships   Social connections    Talks on phone: Not on file    Gets together: Not on file    Attends religious service: Not on file    Active member of club or organization: Not on file    Attends meetings of clubs or organizations: Not on file    Relationship status: Not on file   Intimate partner violence    Fear of current or ex partner: Not on file    Emotionally abused: Not on file    Physically abused: Not on file    Forced sexual activity: Not on file  Other Topics Concern   Not on file  Social History Narrative   Married in 06-Nov-1997, divorced as of Nov 06, 2017, 1 son from previous relationship   Brother with MVA at 17, in rest home for many years as of 11/06/2018- died of covid recently    Family  History  Problem Relation Age of Onset   Breast cancer Mother 29   Hypertension Mother    Colon cancer Father 39   Hyperlipidemia Father    Ovarian cancer Maternal Aunt 24   Breast cancer Cousin    Gallbladder disease Paternal Grandmother    Liver cancer Cousin 78       Malignant   Cancer Maternal Uncle        unsure type     Current Outpatient Medications:    acetaminophen (TYLENOL) 500 MG tablet, Take 1,000 mg by mouth every 6 (six) hours as needed for moderate pain., Disp: , Rfl:    Ascorbic Acid (VITAMIN C) 100 MG tablet, Take 100 mg by mouth daily., Disp: , Rfl:    atorvastatin (LIPITOR) 40 MG tablet, Take 40 mg by mouth daily., Disp: , Rfl: 1   Cholecalciferol (VITAMIN D) 125 MCG (5000 UT) CAPS, Take 5,000 Units by mouth daily. , Disp: , Rfl:    clonazePAM (KLONOPIN) 0.5 MG tablet, Take 0.5 mg by mouth 2 (two) times daily. , Disp: , Rfl:    dexamethasone (DECADRON) 4 MG tablet, Take 2 tablets by mouth daily starting the day after Carboplatin and Cytoxan x 3 days. Take with food., Disp: 30 tablet, Rfl: 1   DULoxetine (CYMBALTA) 60 MG capsule, Take 60 mg by mouth daily. , Disp: , Rfl:    gabapentin (NEURONTIN) 100 MG capsule, Take 1-2 capsules (100-200 mg total) by mouth 3 (three) times daily as needed. (Patient taking differently: Take 100 mg by mouth at bedtime. ), Disp: 90 capsule, Rfl: 1   lidocaine-prilocaine (EMLA) cream, Apply to affected area once, Disp: 30 g, Rfl: 3   loratadine (CLARITIN) 10 MG tablet, Take 1 tablet (10 mg total) by mouth daily., Disp: , Rfl:    omeprazole (PRILOSEC) 20 MG capsule, Take 20 mg by mouth daily. , Disp: , Rfl:    ondansetron (ZOFRAN) 8 MG tablet, Take 1 tablet (8 mg total) by mouth 2 (two) times daily as needed. Start on the third day after chemotherapy., Disp: 30 tablet, Rfl: 1   Plecanatide (TRULANCE) 3 MG TABS, Take 3 mg by mouth every other day. , Disp: , Rfl:    sennosides-docusate sodium (SENOKOT-S) 8.6-50 MG  tablet, Take 1 tablet by mouth daily. Takes every other day, Disp: , Rfl:  valACYclovir (VALTREX) 500 MG tablet, Take 500 mg by mouth daily., Disp: , Rfl:    vitamin B-12 (CYANOCOBALAMIN) 500 MCG tablet, Take 500 mcg by mouth daily., Disp: , Rfl:    ALPRAZolam (XANAX) 0.5 MG tablet, Take 1 tablet (0.5 mg total) by mouth as needed for anxiety. Take 1-2 tablets as needed prior to your MRI (Patient not taking: Reported on 09/09/2019), Disp: 2 tablet, Rfl: 0   butalbital-acetaminophen-caffeine (FIORICET) 50-325-40 MG tablet, Take 1 tablet by mouth every 6 (six) hours as needed for headache. (Patient not taking: Reported on 09/09/2019), Disp: 20 tablet, Rfl: 0   HYDROcodone-acetaminophen (NORCO/VICODIN) 5-325 MG tablet, Take 1 tablet by mouth every 6 (six) hours as needed for moderate pain. (Patient not taking: Reported on 09/09/2019), Disp: 15 tablet, Rfl: 0   LORazepam (ATIVAN) 0.5 MG tablet, Take 1 tablet (0.5 mg total) by mouth every 6 (six) hours as needed (Nausea or vomiting). (Patient not taking: Reported on 09/01/2019), Disp: 30 tablet, Rfl: 0   magic mouthwash w/lidocaine SOLN, Take 5 mLs by mouth 4 (four) times daily. 80 ml viscous lidocaine 2%, 80 ml Mylanta, 80 ml Diphenhydramine 12.5 mg/5 ml Elixir, 80 ml Nystatin 100,000 Unit suspension, 80 ml Prednisolone 15 mg/69ml, 80 ml Distilled Water. Sig: Swish/Swallow 5-10 ml four times a day as needed. Dispense 480 ml. 3RFs, Disp: 480 mL, Rfl: 3   prochlorperazine (COMPAZINE) 10 MG tablet, Take 1 tablet (10 mg total) by mouth every 6 (six) hours as needed (Nausea or vomiting). (Patient not taking: Reported on 09/09/2019), Disp: 30 tablet, Rfl: 1  Physical exam:  Vitals:   09/09/19 1600  BP: 116/61  Pulse: 85  Resp: 18  Temp: (!) 96.4 F (35.8 C)  TempSrc: Tympanic  SpO2: 100%   Physical Exam Constitutional:      General: She is not in acute distress. HENT:     Head: Normocephalic and atraumatic.  Eyes:     Pupils: Pupils are  equal, round, and reactive to light.  Neck:     Musculoskeletal: Normal range of motion.  Cardiovascular:     Rate and Rhythm: Normal rate and regular rhythm.     Heart sounds: Normal heart sounds.  Pulmonary:     Effort: Pulmonary effort is normal.     Breath sounds: Normal breath sounds.  Abdominal:     General: Bowel sounds are normal.     Palpations: Abdomen is soft.  Skin:    General: Skin is warm and dry.  Neurological:     Mental Status: She is alert and oriented to person, place, and time.      CMP Latest Ref Rng & Units 09/09/2019  Glucose 70 - 99 mg/dL 696(E)  BUN 8 - 23 mg/dL 9  Creatinine 9.52 - 8.41 mg/dL 3.24  Sodium 401 - 027 mmol/L 138  Potassium 3.5 - 5.1 mmol/L 3.5  Chloride 98 - 111 mmol/L 102  CO2 22 - 32 mmol/L 29  Calcium 8.9 - 10.3 mg/dL 2.5(D)  Total Protein 6.5 - 8.1 g/dL 6.3(L)  Total Bilirubin 0.3 - 1.2 mg/dL 0.4  Alkaline Phos 38 - 126 U/L 71  AST 15 - 41 U/L 15  ALT 0 - 44 U/L 18   CBC Latest Ref Rng & Units 09/09/2019  WBC 4.0 - 10.5 K/uL 5.5  Hemoglobin 12.0 - 15.0 g/dL 10.1(L)  Hematocrit 36.0 - 46.0 % 31.7(L)  Platelets 150 - 400 K/uL 132(L)    No images are attached to the encounter.  US Breast  Limited Uni Right Inc Axilla  Result Date: 09/11/2019 CLINICAL DATA:  Patient with history of bilateral breast carcinoma. Patient on neoadjuvant chemotherapy. Follow-up exam of the right breast carcinoma. EXAM: ULTRASOUND OF THE RIGHT BREAST COMPARISON:  Previous exam(s). FINDINGS: On physical exam, there is a mobile palpable mass within the lower outer right breast. Targeted ultrasound is performed, showing a 3.5 x 2.1 x 3.0 cm irregular hypoechoic mass with central necrosis right breast 7:30 o'clock 5 cm from nipple, previously measuring 2.3 x 2.0 x 2.7 cm. Overall this mass is difficult to measure given the central cystic change and surrounding hypoechogenicity however it does appear similar to mildly increased in size. IMPRESSION: Within the  right breast 7:30 o'clock there is a centrally necrotic mass which is somewhat difficult to measure and compare with prior given the irregularity and surrounding hypoechoic breast tissue. Overall this mass is somewhat similar to mildly increased in size when compared to prior exam. RECOMMENDATION: Treatment plan for known bilateral breast malignancy. I have discussed the findings and recommendations with the patient. If applicable, a reminder letter will be sent to the patient regarding the next appointment. BI-RADS CATEGORY  6: Known biopsy-proven malignancy. These results were called by telephone at the time of interpretation on 09/11/2019 at 10:17 am to provider [facility] Jeriel Vivanco , who verbally acknowledged these results. Electronically Signed   By: Annia Belt M.D.   On: 09/11/2019 10:19   Mm Clip Placement Left  Result Date: 08/15/2019 CLINICAL DATA:  Status post MRI guided core biopsy of in the left breast. EXAM: DIAGNOSTIC left MAMMOGRAM POST MRI BIOPSY COMPARISON:  Previous exam(s). FINDINGS: Mammographic images were obtained following left breast guided biopsy of mass at left breast 6 position. Cc and lateral views of the left breast demonstrate barbell in the area of concern. IMPRESSION: Post biopsy mammogram demonstrating clip in the area concern. Final Assessment: Post Procedure Mammograms for Marker Placement Electronically Signed   By: Sherian Rein M.D.   On: 08/15/2019 10:20   Mr Oswaldo Milian Breast Bx Jones Bales Dev 1st Lesion Image Bx Spec Mr Guide  Addendum Date: 08/18/2019   ADDENDUM REPORT: 08/18/2019 15:51 ADDENDUM: Pathology revealed INTERMEDIATE GRADE DUCTAL CARCINOMA IN SITU of the LEFT breast, 6 o'clock. This was found to be concordant by Dr. Sherian Rein. Pathology results were discussed with the patient by telephone. The patient reported doing well after the biopsy with tenderness at the site. Post biopsy instructions and care were reviewed and questions were answered. The patient was encouraged to  call [facility] for any additional concerns. The patient has a recent diagnosis of RIGHT breast cancer and should follow her outlined treatment plan. Pathology results reported by Collene Mares, RN on 08/18/2019. Electronically Signed   By: Sherian Rein M.D.   On: 08/18/2019 15:51   Result Date: 08/18/2019 CLINICAL DATA:  Recent diagnosis of right breast cancer. Abnormal mass seen on MRI in the left breast for biopsy. EXAM: MRI GUIDED CORE NEEDLE BIOPSY OF THE left BREAST TECHNIQUE: Multiplanar, multisequence MR imaging of the left breast was performed both before and after administration of intravenous contrast. CONTRAST:  6 ml Gadavist COMPARISON:  Previous exams. FINDINGS: I met with the patient, and we discussed the procedure of MRI guided biopsy, including risks, benefits, and alternatives. Specifically, we discussed the risks of infection, bleeding, tissue injury, clip migration, and inadequate sampling. Informed, written consent was given. The usual time out protocol was performed immediately prior to the procedure. Using sterile  technique, 1% Lidocaine, MRI guidance, and a 9 gauge vacuum assisted device, biopsy was performed of mass at left breast 6 o'clock using a lateral approach. At the conclusion of the procedure, a barbell tissue marker clip was deployed into the biopsy cavity. Follow-up 2-view mammogram was performed and dictated separately. IMPRESSION: MRI guided biopsy of left breast.  No apparent complications. Electronically Signed: By: Sherian Rein M.D. On: 08/15/2019 08:25     Assessment and plan- Patient is a 63 y.o. female newly diagnosed clinical prognostic stage IIIb cT2 cN1 M0 triple negative invasive mammary carcinoma of the right breast.  She is s/p 2 cycles of neoadjuvant AC Keytruda chemotherapy and here for acute visit to evaluate her ongoing fatigue  Cancer-related fatigue: Her symptoms are mild and expected after chemotherapy.  Her blood work is  reassuring.  She is hemodynamically stable with a normal blood pressure and is not tachycardic.  She does not appear dehydrated and she is maintaining adequate oral intake at home.  I have reassured her that she does not need any extra IV fluids at this time.  I do not suspect any underlying infection.  If she feels worse during the week she will give [place] a call.  Otherwise she will continue watchful monitoring and activity as tolerated.  I will see her back in 2 weeks for cycle 3 of neoadjuvant AC Keytruda chemotherapy as planned   Visit Diagnosis 1. Malignant neoplasm of lower-outer quadrant of right breast of female, estrogen receptor negative (HCC)      Dr. Owens Shark, MD, MPH [facility] [phone] 09/12/2019 7:55 AM

## 2019-09-15 ENCOUNTER — Other Ambulatory Visit: Payer: Self-pay

## 2019-09-15 ENCOUNTER — Ambulatory Visit (INDEPENDENT_AMBULATORY_CARE_PROVIDER_SITE_OTHER): Payer: Medicare HMO | Admitting: Family Medicine

## 2019-09-15 ENCOUNTER — Encounter: Payer: Self-pay | Admitting: Family Medicine

## 2019-09-15 DIAGNOSIS — Z171 Estrogen receptor negative status [ER-]: Secondary | ICD-10-CM

## 2019-09-15 DIAGNOSIS — C50511 Malignant neoplasm of lower-outer quadrant of right female breast: Secondary | ICD-10-CM

## 2019-09-15 NOTE — Patient Instructions (Signed)
Don't change your meds for now, I"ll await the notes from your other docs and we'll be in touch.  Take care.  Glad to see you.

## 2019-09-15 NOTE — Telephone Encounter (Signed)
Magic mouthwash script printed

## 2019-09-15 NOTE — Progress Notes (Signed)
Breast cancer with 2 cycles of Keytruda with surgery f/u pending.  We talked about her mood/fatigue and tearfulness "I'm numb and blank and this has been a terrible year."  She feels worse after chemo, with some gradual return to baseline prior to her next cycle.    She has psych f/u via phone about her mood.  She has relative deconditioning after starting chemo.  No CP.    We talked about stressors related to her diagnosis and treatment, Covid considerations, etc.  PMH and SH reviewed Meds, vitals, and allergies reviewed.   ROS: Per HPI.  Unless specifically indicated otherwise in HPI, the patient denies:  General: fever. Eyes: acute vision changes ENT: sore throat Cardiovascular: chest pain Respiratory: SOB GI: vomiting GU: dysuria Musculoskeletal: acute back pain Derm: acute rash Neuro: acute motor dysfunction Psych: worsening mood Endocrine: polydipsia Heme: bleeding Allergy: hayfever  GEN: nad, alert and oriented HEENT: ncat NECK: supple w/o LA CV: rrr. PULM: ctab, no inc wob ABD: soft, +bs EXT: no edema SKIN: no acute rash

## 2019-09-17 DIAGNOSIS — K219 Gastro-esophageal reflux disease without esophagitis: Secondary | ICD-10-CM | POA: Diagnosis not present

## 2019-09-17 DIAGNOSIS — K5909 Other constipation: Secondary | ICD-10-CM | POA: Diagnosis not present

## 2019-09-18 DIAGNOSIS — D0512 Intraductal carcinoma in situ of left breast: Secondary | ICD-10-CM | POA: Diagnosis not present

## 2019-09-18 DIAGNOSIS — H902 Conductive hearing loss, unspecified: Secondary | ICD-10-CM | POA: Diagnosis not present

## 2019-09-18 DIAGNOSIS — C50411 Malignant neoplasm of upper-outer quadrant of right female breast: Secondary | ICD-10-CM | POA: Diagnosis not present

## 2019-09-18 DIAGNOSIS — H6123 Impacted cerumen, bilateral: Secondary | ICD-10-CM | POA: Diagnosis not present

## 2019-09-18 NOTE — Assessment & Plan Note (Signed)
Breast cancer with 2 cycles of Keytruda with surgery f/u pending.  We talked about her mood/fatigue and tearfulness "I'm numb and blank and this has been a terrible year."  She feels worse after chemo, with some gradual return to baseline prior to her next cycle.    She has psych f/u via phone about her mood.  We did not change her psychotropic medications at this point.  We talked about stressors related to her diagnosis and treatment.  She has relative deconditioning after starting chemo.  No CP.  Some of this deconditioning would be expected, discussed with patient.  Overall, no change in meds at this point.  Recent labs discussed with patient.  Defer further health maintenance intervention at this point until she can have her cancer treated.  She agrees.  >25 minutes spent in face to face time with patient, >50% spent in counselling or coordination of care.

## 2019-09-19 ENCOUNTER — Other Ambulatory Visit: Payer: Self-pay

## 2019-09-19 ENCOUNTER — Encounter: Payer: Self-pay | Admitting: Oncology

## 2019-09-19 NOTE — Progress Notes (Signed)
Patient stated that she had been doing well. Patient stated that last time she had her chemo therapy, she felt that the nurse push her chemo really fast and she believes that this is the cause for her to feel bad for 4 days after chemo.

## 2019-09-22 ENCOUNTER — Inpatient Hospital Stay: Payer: Medicare HMO

## 2019-09-22 ENCOUNTER — Inpatient Hospital Stay (HOSPITAL_BASED_OUTPATIENT_CLINIC_OR_DEPARTMENT_OTHER): Payer: Medicare HMO | Admitting: Oncology

## 2019-09-22 ENCOUNTER — Other Ambulatory Visit: Payer: Self-pay

## 2019-09-22 VITALS — BP 115/59 | HR 78 | Temp 97.7°F | Resp 16 | Ht 67.0 in | Wt 152.1 lb

## 2019-09-22 DIAGNOSIS — Z5111 Encounter for antineoplastic chemotherapy: Secondary | ICD-10-CM | POA: Diagnosis not present

## 2019-09-22 DIAGNOSIS — M8580 Other specified disorders of bone density and structure, unspecified site: Secondary | ICD-10-CM | POA: Diagnosis not present

## 2019-09-22 DIAGNOSIS — R42 Dizziness and giddiness: Secondary | ICD-10-CM | POA: Diagnosis not present

## 2019-09-22 DIAGNOSIS — C50511 Malignant neoplasm of lower-outer quadrant of right female breast: Secondary | ICD-10-CM

## 2019-09-22 DIAGNOSIS — Z171 Estrogen receptor negative status [ER-]: Secondary | ICD-10-CM

## 2019-09-22 DIAGNOSIS — E785 Hyperlipidemia, unspecified: Secondary | ICD-10-CM | POA: Diagnosis not present

## 2019-09-22 DIAGNOSIS — Z5112 Encounter for antineoplastic immunotherapy: Secondary | ICD-10-CM | POA: Diagnosis not present

## 2019-09-22 DIAGNOSIS — R5383 Other fatigue: Secondary | ICD-10-CM | POA: Diagnosis not present

## 2019-09-22 DIAGNOSIS — R5381 Other malaise: Secondary | ICD-10-CM | POA: Diagnosis not present

## 2019-09-22 DIAGNOSIS — Z7689 Persons encountering health services in other specified circumstances: Secondary | ICD-10-CM | POA: Diagnosis not present

## 2019-09-22 LAB — COMPREHENSIVE METABOLIC PANEL
ALT: 29 U/L (ref 0–44)
AST: 26 U/L (ref 15–41)
Albumin: 3.7 g/dL (ref 3.5–5.0)
Alkaline Phosphatase: 57 U/L (ref 38–126)
Anion gap: 5 (ref 5–15)
BUN: 13 mg/dL (ref 8–23)
CO2: 29 mmol/L (ref 22–32)
Calcium: 8.9 mg/dL (ref 8.9–10.3)
Chloride: 102 mmol/L (ref 98–111)
Creatinine, Ser: 0.77 mg/dL (ref 0.44–1.00)
GFR calc Af Amer: >60 mL/min (ref >60)
GFR calc non Af Amer: >60 mL/min (ref >60)
Glucose, Bld: 110 mg/dL — ABNORMAL HIGH (ref 70–99)
Potassium: 3.7 mmol/L (ref 3.5–5.1)
Sodium: 136 mmol/L (ref 135–145)
Total Bilirubin: 0.6 mg/dL (ref 0.3–1.2)
Total Protein: 6.5 g/dL (ref 6.5–8.1)

## 2019-09-22 LAB — CBC WITH DIFFERENTIAL/PLATELET
Abs Immature Granulocytes: 0.14 10*3/uL — ABNORMAL HIGH (ref 0.00–0.07)
Basophils Absolute: 0.1 10*3/uL (ref 0.0–0.1)
Basophils Relative: 1 %
Eosinophils Absolute: 0 10*3/uL (ref 0.0–0.5)
Eosinophils Relative: 0 %
HCT: 32.9 % — ABNORMAL LOW (ref 36.0–46.0)
Hemoglobin: 10.8 g/dL — ABNORMAL LOW (ref 12.0–15.0)
Immature Granulocytes: 2 %
Lymphocytes Relative: 10 %
Lymphs Abs: 1 10*3/uL (ref 0.7–4.0)
MCH: 29.8 pg (ref 26.0–34.0)
MCHC: 32.8 g/dL (ref 30.0–36.0)
MCV: 90.6 fL (ref 80.0–100.0)
Monocytes Absolute: 1.4 10*3/uL — ABNORMAL HIGH (ref 0.1–1.0)
Monocytes Relative: 14 %
Neutro Abs: 7 10*3/uL (ref 1.7–7.7)
Neutrophils Relative %: 73 %
Platelets: 281 10*3/uL (ref 150–400)
RBC: 3.63 MIL/uL — ABNORMAL LOW (ref 3.87–5.11)
RDW: 14.2 % (ref 11.5–15.5)
WBC: 9.6 10*3/uL (ref 4.0–10.5)
nRBC: 0 % (ref 0.0–0.2)

## 2019-09-22 MED ORDER — DOXORUBICIN HCL CHEMO IV INJECTION 2 MG/ML
57.1000 mg/m2 | Freq: Once | INTRAVENOUS | Status: AC
  Administered 2019-09-22: 100 mg via INTRAVENOUS
  Filled 2019-09-22: qty 50

## 2019-09-22 MED ORDER — SODIUM CHLORIDE 0.9 % IV SOLN
Freq: Once | INTRAVENOUS | Status: AC
  Administered 2019-09-22: 09:00:00 via INTRAVENOUS
  Filled 2019-09-22: qty 250

## 2019-09-22 MED ORDER — SODIUM CHLORIDE 0.9 % IV SOLN
571.0000 mg/m2 | Freq: Once | INTRAVENOUS | Status: AC
  Administered 2019-09-22: 1000 mg via INTRAVENOUS
  Filled 2019-09-22: qty 50

## 2019-09-22 MED ORDER — PALONOSETRON HCL INJECTION 0.25 MG/5ML
0.2500 mg | Freq: Once | INTRAVENOUS | Status: AC
  Administered 2019-09-22: 0.25 mg via INTRAVENOUS
  Filled 2019-09-22: qty 5

## 2019-09-22 MED ORDER — HEPARIN SOD (PORK) LOCK FLUSH 100 UNIT/ML IV SOLN
500.0000 [IU] | Freq: Once | INTRAVENOUS | Status: AC | PRN
  Administered 2019-09-22: 500 [IU]
  Filled 2019-09-22: qty 5

## 2019-09-22 MED ORDER — SODIUM CHLORIDE 0.9 % IV SOLN
Freq: Once | INTRAVENOUS | Status: AC
  Administered 2019-09-22: 10:00:00 via INTRAVENOUS
  Filled 2019-09-22: qty 5

## 2019-09-22 MED ORDER — SODIUM CHLORIDE 0.9 % IV SOLN
200.0000 mg | Freq: Once | INTRAVENOUS | Status: AC
  Administered 2019-09-22: 200 mg via INTRAVENOUS
  Filled 2019-09-22: qty 8

## 2019-09-22 NOTE — Progress Notes (Signed)
Pt feels weak a little today. She has had hot flashes. She went to bed cold and covered up with blanket and then woke up with sweats and had to change clothes

## 2019-09-22 NOTE — Progress Notes (Signed)
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 Doristine Counter in the past and has undergone breast biopsies which did not previously showed malignancy. More recently patient noted some red discoloration around her right perioral as well as a palpable mass which led to a diagnostic mammogram on the right side her prior mammogram in February 2020 was normal in August 2020 she was noted to have a 2.7 x 2 x 2.3 cm mass in her right breast along with 4 morphologically abnormal lymph nodes. She underwent breast biopsy as well as lymph node biopsy which showed grade 3 invasive mammary carcinoma. ER PR and HER-2/neunegative.Sections show high-grade invasive carcinoma with focal squamous differentiation and pinpoint keratin ideation. The features are concerning for metaplastic differentiation.[name] Lymphovascular invasion present. There was extranodal extension present on the lymph node biopsy.   Her family history is significant for breast cancer in her mother in her 52s. Father had colon cancer in his70s.Family history also significant for ovarian cancer in her maternal aunt.  MRI of bilateral breasts showed: Primary right breast mass was 3.5 x 2.7 x 3.4 cm. There were  surrounding numerous nodules consistent with satellite lesions. Extensive nodular non-mass enhancement throughout the right breast involving the upper and lower quadrants spanning 6.2 x 4.5 x 5.5 cm. Markedly enhancement of the left breast diffusely. Non-mass enhancement measures 1.9 x 2.4 cm. At least 3 abnormal lymph nodes in the right axilla. No abnormal left axillary lymph nodes.   PET CT scan showed hypermetabolic possible satellite nodule just cephalad to the right breast lesion. Equivocal inferior breast and left axillary nodal hypermetabolism. No evidence of extrathoracic hypermetabolic metastases. Lateral right breast primary with right axillary nodal metastases  Interval history-feels anxious but denies other complaints at this time.  She does feel fatigued and lightheaded for 1 or 2 days after chemotherapy which resolves on its own.  ECOG PS- 1 Pain scale- 0   Review of systems- Review of Systems  Constitutional: Positive for malaise/fatigue. Negative for chills, fever and weight loss.  HENT: Negative for congestion, ear discharge and nosebleeds.   Eyes: Negative for blurred vision.  Respiratory: Negative for cough, hemoptysis, sputum production, shortness of breath and wheezing.   Cardiovascular: Negative for chest pain, palpitations, orthopnea and claudication.  Gastrointestinal: Negative for abdominal pain, blood in stool, constipation, diarrhea, heartburn, melena, nausea and vomiting.  Genitourinary: Negative for dysuria, flank pain, frequency, hematuria and urgency.  Musculoskeletal: Negative for back pain, joint pain and myalgias.  Skin: Negative for rash.  Neurological: Negative for dizziness, tingling, focal weakness, seizures, weakness and headaches.  Endo/Heme/Allergies: Does not bruise/bleed easily.  Psychiatric/Behavioral: Negative for depression and suicidal ideas. The patient is nervous/anxious. The patient does not have insomnia.       Allergies   Allergen Reactions  . Amitiza [Lubiprostone] Other (See Comments)    Overly effective at  higher dose  . Bentyl [Dicyclomine Hcl]     Lack of effect  . Buspar [Buspirone] Other (See Comments)    Palpitations.   . Celecoxib     REACTION: unspecified  . Cortisone Other (See Comments)    flush  . Doxycycline     Palpitations and chest pain  . Mobic [Meloxicam]     Palpitations.  But can tolerate aleve  . Nitrofurantoin Monohyd Macro   . Penicillins     REACTION: rash  . Sulfonamide Derivatives     REACTION: nausea  . Zoloft [Sertraline Hcl]     nausea and diarrhea.        Past Medical History:  Diagnosis Date  . Anxiety    sees Dr. Nolen Mu  . Breast cancer (HCC) 2020  . Cyst of breast    per Dr. Lemar Livings  . Family history of breast cancer   . Family history of colon cancer   . Family history of leukemia   . Family history of ovarian cancer   . Fibromyalgia   . GERD (gastroesophageal reflux disease)   . Heart murmur   . Hemorrhoids   . Herpes, genital 04/2015   confirmed with HSV 2 IgG  . Hiatal hernia   . Hypercholesterolemia    Dr. Rushie Goltz  . Hyperlipidemia   . IBS (irritable bowel syndrome)    constipation predominant  . IC (interstitial cystitis)    per [facility]  . Mild depression (HCC)   . MVP (mitral valve prolapse)    [facility] cards eval 2012  . Osteopenia    2010/2017, DEXA at [facility];spine and fem neck  . Vitamin D deficiency    history of     Past Surgical History:  Procedure Laterality Date  . ABDOMINAL HYSTERECTOMY    . APPENDECTOMY    . BLADDER SURGERY     1980's  . BREAST EXCISIONAL BIOPSY Right    benign  . BREAST EXCISIONAL BIOPSY Bilateral    benign  . COLONOSCOPY  09/2014   Dr. Mechele Collin  . COLONOSCOPY  2009   at [facility] 1 POLYP (BENIGN)  . excision of breast cysts     hx of multiple cyst aspirations  . EXPLORATORY LAPAROTOMY    . PORTA CATH INSERTION N/A 08/06/2019   Procedure: PORTA CATH INSERTION;  Surgeon: Renford Dills,  MD;  Location: [facility] INVASIVE CV LAB;  Service: Cardiovascular;  Laterality: N/A;  . PORTACATH PLACEMENT Left 08/05/2019   Procedure: INSERTION PORT-A-CATH, Attempted;  Surgeon: Leafy Ro, MD;  Location: [facility] ORS;  Service: General;  Laterality: Left;  . TUBAL LIGATION      Social History   Socioeconomic History  . Marital status: Divorced    Spouse name: Not on file  . Number of children: 1  . Years of education: Not on file  . Highest education level: Not on file  Occupational History  . Occupation: [employer]  Social Needs  . Financial resource strain: Not on file  . Food insecurity    Worry: Not on file    Inability: Not on file  . Transportation needs    Medical: Not on file    Non-medical: Not on file  Tobacco Use  . Smoking status: Never Smoker  . Smokeless tobacco: Never Used  Substance and Sexual Activity  . Alcohol use: No    Alcohol/week: 0.0 standard drinks  . Drug use: No  . Sexual activity: Yes    Birth control/protection: Surgical    Comment: Hysterectomy  Lifestyle  . Physical activity    Days per week: Not on file    Minutes per session: Not on file  . Stress: Not on file  Relationships  . Social Musician on phone: Not on file    Gets together: Not on file    Attends religious service: Not on file    Active member of club or organization: Not on file    Attends meetings of clubs or organizations: Not on file    Relationship status: Not on file  . Intimate partner violence    Fear of current or ex partner: Not on file    Emotionally abused: Not on file    Physically abused: Not on file    Forced sexual activity: Not on file  Other Topics Concern  . Not on file  Social History Narrative   Married in 10/24/97, divorced as of 10-24-2017, 1 son from previous relationship   Brother with MVA at 93, in rest home for many years as of 10-24-18- died of covid recently    Family History  Problem Relation Age of Onset  . Breast cancer Mother 66  .  Hypertension Mother   . Colon cancer Father 23  . Hyperlipidemia Father   . Ovarian cancer Maternal Aunt 80  . Breast cancer Cousin   . Gallbladder disease Paternal Grandmother   . Liver cancer Cousin 70       Malignant  . Cancer Maternal Uncle        unsure type     Current Outpatient Medications:  .  acetaminophen (TYLENOL) 500 MG tablet, Take 1,000 mg by mouth every 6 (six) hours as needed for moderate pain., Disp: , Rfl:  .  Ascorbic Acid (VITAMIN C) 100 MG tablet, Take 100 mg by mouth daily., Disp: , Rfl:  .  atorvastatin (LIPITOR) 40 MG tablet, Take 40 mg by mouth daily., Disp: , Rfl: 1 .  Cholecalciferol (VITAMIN D) 125 MCG (5000 UT) CAPS, Take 5,000 Units by mouth daily. , Disp: , Rfl:  .  clonazePAM (KLONOPIN) 0.5 MG tablet, Take 0.5 mg by mouth 2 (two) times daily. , Disp: , Rfl:  .  dexamethasone (DECADRON) 4 MG tablet, Take 2 tablets by mouth daily starting the day after Carboplatin and Cytoxan x 3 days. Take with food., Disp: 30 tablet, Rfl: 1 .  DULoxetine (CYMBALTA) 60 MG capsule, Take 60 mg by mouth daily. , Disp: , Rfl:  .  gabapentin (NEURONTIN) 100 MG capsule, Take 1-2 capsules (100-200 mg total) by mouth 3 (three) times daily as needed., Disp: 90 capsule, Rfl: 1 .  lidocaine-prilocaine (EMLA) cream, Apply to affected area once, Disp: 30 g, Rfl: 3 .  loratadine (CLARITIN) 10 MG tablet, Take 1 tablet (10 mg total) by mouth daily., Disp: , Rfl:  .  magic mouthwash w/lidocaine SOLN, Take 5 mLs by mouth 4 (four) times daily. 80 ml viscous lidocaine 2%, 80 ml Mylanta, 80 ml Diphenhydramine 12.5 mg/5 ml Elixir, 80 ml Nystatin 100,000 Unit suspension, 80 ml Prednisolone 15 mg/59ml, 80 ml Distilled Water. Sig: Swish/Swallow 5-10 ml four times a day as needed. Dispense 480 ml. 3RFs, Disp: 480 mL, Rfl: 3 .  omeprazole (PRILOSEC) 20 MG capsule, Take 20 mg by mouth daily. , Disp: , Rfl:  .  ondansetron (ZOFRAN) 8 MG tablet, Take 1 tablet (8 mg total) by mouth 2 (two) times daily as  needed. Start on the third day after chemotherapy., Disp: 30 tablet, Rfl: 1 .  Plecanatide (TRULANCE) 3 MG TABS, Take 3 mg by mouth every other day. , Disp: , Rfl:  .  prochlorperazine (COMPAZINE) 10 MG tablet, Take 1 tablet (10 mg total) by mouth every 6 (six) hours as needed (Nausea or vomiting)., Disp: 30 tablet, Rfl: 1 .  sennosides-docusate sodium (SENOKOT-S) 8.6-50 MG tablet, Take 1 tablet by mouth daily. Takes every other day, Disp: , Rfl:  .  valACYclovir (VALTREX) 500 MG tablet, Take 500 mg by mouth daily., Disp: , Rfl:  .  vitamin B-12 (CYANOCOBALAMIN) 500 MCG tablet, Take 500 mcg by mouth daily., Disp: , Rfl:  .  butalbital-acetaminophen-caffeine (FIORICET) 50-325-40 MG tablet, Take 1 tablet by mouth every 6 (six) hours as needed for headache. (Patient not taking: Reported on 09/19/2019), Disp: 20 tablet, Rfl: 0 .  HYDROcodone-acetaminophen (NORCO/VICODIN) 5-325 MG tablet, Take 1 tablet by mouth every 6 (six) hours as needed for moderate pain. (Patient not taking: Reported on 09/19/2019), Disp: 15 tablet, Rfl: 0  Physical exam:  Vitals:   09/22/19 0850  BP: (!) 115/59  Pulse: 78  Resp: 16  Temp: 97.7 F (36.5 C)  TempSrc: Tympanic  Weight: 152 lb 1.6 oz (69 kg)  Height: 5\' 7"  (1.702 m)   Physical Exam Constitutional:      General: She is not in acute distress. HENT:     Head: Normocephalic and atraumatic.  Eyes:     Pupils: Pupils are equal, round, and reactive to light.  Neck:     Musculoskeletal: Normal range of motion.  Cardiovascular:     Rate and Rhythm: Normal rate and regular rhythm.     Heart sounds: Normal heart sounds.  Pulmonary:     Effort: Pulmonary effort is normal.     Breath sounds: Normal breath sounds.  Abdominal:     General: Bowel sounds are normal.     Palpations: Abdomen is soft.  Skin:    General: Skin is warm and dry.  Neurological:     Mental Status: She is alert and oriented to person, place, and time.   Large firm right breast mass  which is essentially unchanged over the last 6 weeks and measures close to 5 cm.  No palpable right axillary adenopathy  CMP Latest Ref Rng & Units 09/09/2019  Glucose 70 - 99 mg/dL 403(K)  BUN 8 - 23 mg/dL 9  Creatinine 7.42 - 5.95 mg/dL 6.38  Sodium 756 - 433 mmol/L 138  Potassium 3.5 - 5.1 mmol/L 3.5  Chloride 98 - 111 mmol/L 102  CO2 22 - 32 mmol/L 29  Calcium 8.9 - 10.3 mg/dL 2.9(J)  Total Protein 6.5 - 8.1 g/dL 6.3(L)  Total Bilirubin 0.3 - 1.2 mg/dL 0.4  Alkaline Phos 38 - 126 U/L 71  AST 15 - 41 U/L 15  ALT 0 - 44 U/L 18   CBC Latest Ref Rng & Units 09/22/2019  WBC 4.0 - 10.5 K/uL 9.6  Hemoglobin 12.0 - 15.0 g/dL 10.8(L)  Hematocrit 36.0 - 46.0 % 32.9(L)  Platelets 150 - 400 K/uL 281    No images are attached to the encounter.  US Breast Limited Uni Right Inc Axilla  Result Date: 09/11/2019 CLINICAL DATA:  Patient with history of bilateral breast carcinoma. Patient on neoadjuvant chemotherapy. Follow-up exam of the right breast carcinoma. EXAM: ULTRASOUND OF THE RIGHT BREAST COMPARISON:  Previous exam(s). FINDINGS: On physical exam, there is a mobile palpable mass within the lower outer right breast. Targeted ultrasound is performed, showing a 3.5 x 2.1 x 3.0  cm irregular hypoechoic mass with central necrosis right breast 7:30 o'clock 5 cm from nipple, previously measuring 2.3 x 2.0 x 2.7 cm. Overall this mass is difficult to measure given the central cystic change and surrounding hypoechogenicity however it does appear similar to mildly increased in size. IMPRESSION: Within the right breast 7:30 o'clock there is a centrally necrotic mass which is somewhat difficult to measure and compare with prior given the irregularity and surrounding hypoechoic breast tissue. Overall this mass is somewhat similar to mildly increased in size when compared to prior exam. RECOMMENDATION: Treatment plan for known bilateral breast malignancy. I have discussed the findings and recommendations with  the patient. If applicable, a reminder letter will be sent to the patient regarding the next appointment. BI-RADS CATEGORY  6: Known biopsy-proven malignancy. These results were called by telephone at the time of interpretation on 09/11/2019 at 10:17 am to provider [facility] Ladislaus Repsher , who verbally acknowledged these results. Electronically Signed   By: Annia Belt M.D.   On: 09/11/2019 10:19     Assessment and plan- Patient is a 63 y.o. female with clinical prognostic stage IIIb cT2 cN1 M0 triple negative invasive mammary carcinoma of the right breast.  She is here for on treatment assessment prior to cycle 3 of neoadjuvant AC Keytruda chemotherapy  Counts okay to proceed with cycle 3 of neoadjuvant AC Keytruda chemotherapy today.  She will come back tomorrow to receive Udenyca.  Patient has not had much of a clinical response with neoadjuvant chemotherapy.  I discussed her case with Dr. Dwain Sarna and plan is to take her for upfront surgery at this time on 10/09/2019.  She will be undergoing bilateral mastectomy and lymph node dissection on the right side and a possible sentinel lymph node on the left side  She will come back on 10/07/2019 for labs CBC with differential, CMP to make sure they are okay before proceeding for surgery.  She does have mild chemo induced anemia which has essentially been stable and we will continue to monitor.  I will see her back on 10/21/2019 to discuss the results of final pathology and further management.  Plan would be to complete her chemotherapy adjuvantly with 1 remaining cycle of AC along with 12 weekly cycles of Taxol plus minus Keytruda.   Visit Diagnosis 1. Malignant neoplasm of lower-outer quadrant of right breast of female, estrogen receptor negative (HCC)   2. Encounter for antineoplastic chemotherapy   3. Encounter for antineoplastic immunotherapy      Dr. Owens Shark, MD, MPH [facility] [phone] 09/22/2019 9:35 AM

## 2019-09-23 ENCOUNTER — Inpatient Hospital Stay: Payer: Medicare HMO

## 2019-09-23 ENCOUNTER — Other Ambulatory Visit: Payer: Self-pay

## 2019-09-23 ENCOUNTER — Other Ambulatory Visit: Payer: Self-pay | Admitting: General Surgery

## 2019-09-23 DIAGNOSIS — R5383 Other fatigue: Secondary | ICD-10-CM | POA: Diagnosis not present

## 2019-09-23 DIAGNOSIS — Z5111 Encounter for antineoplastic chemotherapy: Secondary | ICD-10-CM | POA: Diagnosis not present

## 2019-09-23 DIAGNOSIS — C50511 Malignant neoplasm of lower-outer quadrant of right female breast: Secondary | ICD-10-CM | POA: Diagnosis not present

## 2019-09-23 DIAGNOSIS — R42 Dizziness and giddiness: Secondary | ICD-10-CM | POA: Diagnosis not present

## 2019-09-23 DIAGNOSIS — R5381 Other malaise: Secondary | ICD-10-CM | POA: Diagnosis not present

## 2019-09-23 DIAGNOSIS — Z5112 Encounter for antineoplastic immunotherapy: Secondary | ICD-10-CM | POA: Diagnosis not present

## 2019-09-23 DIAGNOSIS — Z7689 Persons encountering health services in other specified circumstances: Secondary | ICD-10-CM | POA: Diagnosis not present

## 2019-09-23 DIAGNOSIS — Z171 Estrogen receptor negative status [ER-]: Secondary | ICD-10-CM | POA: Diagnosis not present

## 2019-09-23 DIAGNOSIS — M8580 Other specified disorders of bone density and structure, unspecified site: Secondary | ICD-10-CM | POA: Diagnosis not present

## 2019-09-23 DIAGNOSIS — E785 Hyperlipidemia, unspecified: Secondary | ICD-10-CM | POA: Diagnosis not present

## 2019-09-23 MED ORDER — PEGFILGRASTIM-CBQV 6 MG/0.6ML ~~LOC~~ SOSY
6.0000 mg | PREFILLED_SYRINGE | Freq: Once | SUBCUTANEOUS | Status: AC
  Administered 2019-09-23: 6 mg via SUBCUTANEOUS
  Filled 2019-09-23: qty 0.6

## 2019-09-26 ENCOUNTER — Telehealth: Payer: Self-pay | Admitting: *Deleted

## 2019-09-26 MED ORDER — AZITHROMYCIN 250 MG PO TABS: ORAL_TABLET | ORAL | 0 refills | Status: DC

## 2019-09-26 NOTE — Telephone Encounter (Signed)
Doubt this is coming from chemo. We can send her a prescription for z pak. She can follow conservative measures. Plenty of fluids and tylenol or occasional ibuprofen. If she feels worse over the weekend she can take the zpak

## 2019-09-26 NOTE — Telephone Encounter (Signed)
Patient called reporting that she had chemotherapy Monday and that for the past 2 -3 days she has had pain in her ears when she swallows and the top of her throat is a little sore. She is asking if this is to be expected. Please advise

## 2019-09-26 NOTE — Telephone Encounter (Signed)
Call returned to patient and advised of doctor response. She agrees to plan. Prescription sent to pharmacy

## 2019-10-03 NOTE — Progress Notes (Signed)
[place], [place] [address] Phone: [phone] Fax: [place], [place] - [address] [place] [address] Phone: [phone] Fax: [phone]  [id number] - [place], [place] [address] [place] [address] Phone: [phone] Fax: [phone]      Your procedure is scheduled on Thursday, November 12th.  Report to [facility] Main Entrance "A" at 10:45 A.M., and check in at the Admitting office.  Call this number if you have problems the morning of surgery:  [phone]  Call [phone] if you have any questions prior to your surgery date Monday-Friday 8am-4pm    Remember:  Do not eat or drink after midnight the night before your surgery  You may drink clear liquids until 9:45 AM the morning of your surgery.   Clear liquids allowed are: Water, Non-Citrus Juices (without pulp), Carbonated Beverages, Clear Tea, Black Coffee Only, and Gatorade    Enhanced Recovery after Surgery for Orthopedics Enhanced Recovery after Surgery is a protocol used to improve the stress on your body and your recovery after surgery.  Patient Instructions  . The night before surgery:  o No food after midnight. ONLY clear liquids after midnight  .  [name] The day of surgery (if you do NOT have diabetes):  o Drink ONE (1) Pre-Surgery Clear Ensure as directed.   o This drink was given to you during your hospital  pre-op appointment visit. o The pre-op nurse will instruct you on the time to drink the  Pre-Surgery Ensure depending on your surgery time. o Finish the drink at the designated time by the pre-op nurse.  o Nothing else to drink after completing the  Pre-Surgery Clear Ensure.          If you have questions, please contact your surgeon's office.     Take these medicines the morning of surgery with A SIP OF WATER   Tylenol  - if needed  Atorvastatin (Lipitor)   Clonazepam (Klonopin)  Duloxetine (Cymbalta)  Gabapentin (Neurontin)  Hydrocodone-Acetaminophen  Omeprazole (Prilosec)  Zofran - if needed  Compazine - if needed      7 days prior to surgery STOP taking any Aspirin (unless otherwise instructed by your surgeon), Aleve, Naproxen, Ibuprofen, Motrin, Advil, Goody's, BC's, all herbal medications, fish oil, and all vitamins.    The Morning of Surgery  Do not wear jewelry, make-up or nail polish.  Do not wear lotions, powders, or perfumes, or deodorant  Do not shave 48 hours prior to surgery.   Do not bring valuables to the hospital.  [facility] is not responsible for any belongings or valuables.  If you are a smoker, DO NOT Smoke 24 hours prior to surgery IF you wear a CPAP at night please bring your mask, tubing, and machine the morning of surgery   Remember that you must have someone to transport you home after your surgery, and remain with you for 24 hours if you are discharged the same day.   Contacts, glasses, hearing aids, dentures or bridgework may not be worn into surgery.    Leave your suitcase in the car.  After surgery it may be brought to your room.  For patients admitted to the hospital, discharge time will be determined by your treatment team.  Patients discharged the day of surgery will not be allowed to drive home.    Special instructions:   [place]-  Preparing For Surgery  Before surgery, you can play an important role. Because skin is not sterile, your skin needs to be as free of germs as possible. You can reduce the number of germs on your skin by washing with CHG (chlorahexidine gluconate) Soap before surgery.  CHG is an antiseptic cleaner which kills germs and bonds with the skin to continue killing germs even after washing.    Oral Hygiene is also important to reduce your risk of infection.  Remember - BRUSH YOUR TEETH THE MORNING OF SURGERY WITH YOUR REGULAR  TOOTHPASTE  Please do not use if you have an allergy to CHG or antibacterial soaps. If your skin becomes reddened/irritated stop using the CHG.  Do not shave (including legs and underarms) for at least 48 hours prior to first CHG shower. It is OK to shave your face.  Please follow these instructions carefully.   1. Shower the NIGHT BEFORE SURGERY and the MORNING OF SURGERY with CHG Soap.   2. If you chose to wash your hair, wash your hair first as usual with your normal shampoo.  3. After you shampoo, rinse your hair and body thoroughly to remove the shampoo.  4. Use CHG as you would any other liquid soap. You can apply CHG directly to the skin and wash gently with a scrungie or a clean washcloth.   5. Apply the CHG Soap to your body ONLY FROM THE NECK DOWN.  Do not use on open wounds or open sores. Avoid contact with your eyes, ears, mouth and genitals (private parts). Wash Face and genitals (private parts)  with your normal soap.   6. Wash thoroughly, paying special attention to the area where your surgery will be performed.  7. Thoroughly rinse your body with warm water from the neck down.  8. DO NOT shower/wash with your normal soap after using and rinsing off the CHG Soap.  9. Pat yourself dry with a CLEAN TOWEL.  10. Wear CLEAN PAJAMAS to bed the night before surgery, wear comfortable clothes the morning of surgery  11. Place CLEAN SHEETS on your bed the night of your first shower and DO NOT SLEEP WITH PETS.    Day of Surgery:  Do not apply any deodorants/lotions. Please shower the morning of surgery with the CHG soap  Please wear clean clothes to the hospital/surgery center.   Remember to brush your teeth WITH YOUR REGULAR TOOTHPASTE.   Please read over the following fact sheets that you were given.

## 2019-10-06 ENCOUNTER — Other Ambulatory Visit: Payer: Self-pay

## 2019-10-06 ENCOUNTER — Encounter (HOSPITAL_COMMUNITY)
Admission: RE | Admit: 2019-10-06 | Discharge: 2019-10-06 | Disposition: A | Discharge status: Home / Self Care | Payer: Medicare HMO | Source: Ambulatory Visit | Attending: General Surgery | Admitting: General Surgery

## 2019-10-06 ENCOUNTER — Encounter (HOSPITAL_COMMUNITY): Payer: Self-pay

## 2019-10-06 ENCOUNTER — Other Ambulatory Visit (HOSPITAL_COMMUNITY)
Admission: RE | Admit: 2019-10-06 | Discharge: 2019-10-06 | Disposition: A | Discharge status: Home / Self Care | Payer: Medicare HMO | Source: Ambulatory Visit | Attending: General Surgery | Admitting: General Surgery

## 2019-10-06 DIAGNOSIS — Z01818 Encounter for other preprocedural examination: Secondary | ICD-10-CM | POA: Insufficient documentation

## 2019-10-06 DIAGNOSIS — Z20828 Contact with and (suspected) exposure to other viral communicable diseases: Secondary | ICD-10-CM | POA: Diagnosis not present

## 2019-10-06 DIAGNOSIS — Z01812 Encounter for preprocedural laboratory examination: Secondary | ICD-10-CM | POA: Insufficient documentation

## 2019-10-06 DIAGNOSIS — D649 Anemia, unspecified: Secondary | ICD-10-CM | POA: Insufficient documentation

## 2019-10-06 DIAGNOSIS — C50911 Malignant neoplasm of unspecified site of right female breast: Secondary | ICD-10-CM | POA: Insufficient documentation

## 2019-10-06 DIAGNOSIS — C50912 Malignant neoplasm of unspecified site of left female breast: Secondary | ICD-10-CM | POA: Insufficient documentation

## 2019-10-06 DIAGNOSIS — R002 Palpitations: Secondary | ICD-10-CM | POA: Diagnosis not present

## 2019-10-06 DIAGNOSIS — F419 Anxiety disorder, unspecified: Secondary | ICD-10-CM | POA: Insufficient documentation

## 2019-10-06 DIAGNOSIS — I499 Cardiac arrhythmia, unspecified: Secondary | ICD-10-CM | POA: Insufficient documentation

## 2019-10-06 DIAGNOSIS — R011 Cardiac murmur, unspecified: Secondary | ICD-10-CM | POA: Insufficient documentation

## 2019-10-06 DIAGNOSIS — K219 Gastro-esophageal reflux disease without esophagitis: Secondary | ICD-10-CM | POA: Insufficient documentation

## 2019-10-06 DIAGNOSIS — E785 Hyperlipidemia, unspecified: Secondary | ICD-10-CM | POA: Insufficient documentation

## 2019-10-06 DIAGNOSIS — Z79899 Other long term (current) drug therapy: Secondary | ICD-10-CM | POA: Insufficient documentation

## 2019-10-06 DIAGNOSIS — F329 Major depressive disorder, single episode, unspecified: Secondary | ICD-10-CM | POA: Insufficient documentation

## 2019-10-06 HISTORY — DX: Other specified postprocedural states: Z98.890

## 2019-10-06 HISTORY — DX: Other specified postprocedural states: R11.2

## 2019-10-06 HISTORY — DX: Anemia, unspecified: D64.9

## 2019-10-06 HISTORY — DX: Cardiac arrhythmia, unspecified: I49.9

## 2019-10-06 LAB — CBC
HCT: 35.1 % — ABNORMAL LOW (ref 36.0–46.0)
Hemoglobin: 10.9 g/dL — ABNORMAL LOW (ref 12.0–15.0)
MCH: 29.9 pg (ref 26.0–34.0)
MCHC: 31.1 g/dL (ref 30.0–36.0)
MCV: 96.2 fL (ref 80.0–100.0)
Platelets: 232 10*3/uL (ref 150–400)
RBC: 3.65 MIL/uL — ABNORMAL LOW (ref 3.87–5.11)
RDW: 14.6 % (ref 11.5–15.5)
WBC: 11.5 10*3/uL — ABNORMAL HIGH (ref 4.0–10.5)
nRBC: 0.5 % — ABNORMAL HIGH (ref 0.0–0.2)

## 2019-10-06 LAB — BASIC METABOLIC PANEL
Anion gap: 7 (ref 5–15)
BUN: 9 mg/dL (ref 8–23)
CO2: 28 mmol/L (ref 22–32)
Calcium: 8.9 mg/dL (ref 8.9–10.3)
Chloride: 107 mmol/L (ref 98–111)
Creatinine, Ser: 1.12 mg/dL — ABNORMAL HIGH (ref 0.44–1.00)
GFR calc Af Amer: >60 mL/min (ref >60)
GFR calc non Af Amer: 53 mL/min — ABNORMAL LOW (ref >60)
Glucose, Bld: 115 mg/dL — ABNORMAL HIGH (ref 70–99)
Potassium: 3.8 mmol/L (ref 3.5–5.1)
Sodium: 142 mmol/L (ref 135–145)

## 2019-10-06 NOTE — Progress Notes (Signed)
PCP - Elsie Stain, MD Cardiologist - Bartholome Bill, MD  Chest x-ray - 08/05/19 EKG - 10/06/19 Stress Test - 04/12/18 ECHO - 08/25/19 Cardiac Cath - denies  Sleep Study - 09/20/18 - negative per patient CPAP - NO  Blood Thinner Instructions: N/A Aspirin Instructions: N/A  ERAS Protcol - Able to drink clear liquids until 9:45AM DOX PRE-SURGERY Ensure or G2- Given pre-surgery Ensure and instructions  COVID TEST- 10/06/19  Coronavirus Screening  Have you experienced the following symptoms:  Cough yes/no: No Fever (>100.75F)  yes/no: No Runny nose yes/no: No Sore throat yes/no: No Difficulty breathing/shortness of breath  yes/no: No  Have you or a family member traveled in the last 14 days and where? yes/no: No  If the patient indicates "YES" to the above questions, their PAT will be rescheduled to limit the exposure to others and, the surgeon will be notified. THE PATIENT WILL NEED TO BE ASYMPTOMATIC FOR 14 DAYS.   If the patient is not experiencing any of these symptoms, the PAT nurse will instruct them to NOT bring anyone with them to their appointment since they may have these symptoms or traveled as well.   Please remind your patients and families that hospital visitation restrictions are in effect and the importance of the restrictions.   Anesthesia review: Yes; Hx MVP, palpitations; currently on Chemo  Patient denies shortness of breath, fever, cough and chest pain at PAT appointment   All instructions explained to the patient, with a verbal understanding of the material. Patient agrees to go over the instructions while at home for a better understanding. Patient also instructed to self quarantine after being tested for COVID-19. The opportunity to ask questions was provided.

## 2019-10-07 ENCOUNTER — Other Ambulatory Visit: Payer: Self-pay

## 2019-10-07 ENCOUNTER — Inpatient Hospital Stay: Payer: Medicare HMO | Attending: Oncology

## 2019-10-07 DIAGNOSIS — M8580 Other specified disorders of bone density and structure, unspecified site: Secondary | ICD-10-CM | POA: Diagnosis not present

## 2019-10-07 DIAGNOSIS — R5383 Other fatigue: Secondary | ICD-10-CM | POA: Diagnosis not present

## 2019-10-07 DIAGNOSIS — Z171 Estrogen receptor negative status [ER-]: Secondary | ICD-10-CM | POA: Insufficient documentation

## 2019-10-07 DIAGNOSIS — Z800 Family history of malignant neoplasm of digestive organs: Secondary | ICD-10-CM | POA: Diagnosis not present

## 2019-10-07 DIAGNOSIS — Z803 Family history of malignant neoplasm of breast: Secondary | ICD-10-CM | POA: Diagnosis not present

## 2019-10-07 DIAGNOSIS — K219 Gastro-esophageal reflux disease without esophagitis: Secondary | ICD-10-CM | POA: Diagnosis not present

## 2019-10-07 DIAGNOSIS — C50511 Malignant neoplasm of lower-outer quadrant of right female breast: Secondary | ICD-10-CM | POA: Diagnosis present

## 2019-10-07 DIAGNOSIS — Z79899 Other long term (current) drug therapy: Secondary | ICD-10-CM | POA: Diagnosis not present

## 2019-10-07 DIAGNOSIS — Z8041 Family history of malignant neoplasm of ovary: Secondary | ICD-10-CM | POA: Insufficient documentation

## 2019-10-07 DIAGNOSIS — Z806 Family history of leukemia: Secondary | ICD-10-CM | POA: Insufficient documentation

## 2019-10-07 DIAGNOSIS — Z9013 Acquired absence of bilateral breasts and nipples: Secondary | ICD-10-CM | POA: Diagnosis not present

## 2019-10-07 DIAGNOSIS — R5381 Other malaise: Secondary | ICD-10-CM | POA: Diagnosis not present

## 2019-10-07 DIAGNOSIS — F419 Anxiety disorder, unspecified: Secondary | ICD-10-CM | POA: Insufficient documentation

## 2019-10-07 DIAGNOSIS — E7800 Pure hypercholesterolemia, unspecified: Secondary | ICD-10-CM | POA: Insufficient documentation

## 2019-10-07 DIAGNOSIS — R69 Illness, unspecified: Secondary | ICD-10-CM | POA: Diagnosis not present

## 2019-10-07 LAB — COMPREHENSIVE METABOLIC PANEL
ALT: 25 U/L (ref 0–44)
AST: 23 U/L (ref 15–41)
Albumin: 3.9 g/dL (ref 3.5–5.0)
Alkaline Phosphatase: 60 U/L (ref 38–126)
Anion gap: 5 (ref 5–15)
BUN: 13 mg/dL (ref 8–23)
CO2: 30 mmol/L (ref 22–32)
Calcium: 9 mg/dL (ref 8.9–10.3)
Chloride: 103 mmol/L (ref 98–111)
Creatinine, Ser: 0.76 mg/dL (ref 0.44–1.00)
GFR calc Af Amer: >60 mL/min (ref >60)
GFR calc non Af Amer: >60 mL/min (ref >60)
Glucose, Bld: 105 mg/dL — ABNORMAL HIGH (ref 70–99)
Potassium: 3.9 mmol/L (ref 3.5–5.1)
Sodium: 138 mmol/L (ref 135–145)
Total Bilirubin: 0.4 mg/dL (ref 0.3–1.2)
Total Protein: 6.9 g/dL (ref 6.5–8.1)

## 2019-10-07 LAB — CBC WITH DIFFERENTIAL/PLATELET
Abs Immature Granulocytes: 1.22 10*3/uL — ABNORMAL HIGH (ref 0.00–0.07)
Basophils Absolute: 0.1 10*3/uL (ref 0.0–0.1)
Basophils Relative: 1 %
Eosinophils Absolute: 0 10*3/uL (ref 0.0–0.5)
Eosinophils Relative: 0 %
HCT: 33.1 % — ABNORMAL LOW (ref 36.0–46.0)
Hemoglobin: 10.7 g/dL — ABNORMAL LOW (ref 12.0–15.0)
Immature Granulocytes: 12 %
Lymphocytes Relative: 8 %
Lymphs Abs: 0.9 10*3/uL (ref 0.7–4.0)
MCH: 30 pg (ref 26.0–34.0)
MCHC: 32.3 g/dL (ref 30.0–36.0)
MCV: 92.7 fL (ref 80.0–100.0)
Monocytes Absolute: 1 10*3/uL (ref 0.1–1.0)
Monocytes Relative: 10 %
Neutro Abs: 7.2 10*3/uL (ref 1.7–7.7)
Neutrophils Relative %: 69 %
Platelets: 216 10*3/uL (ref 150–400)
RBC: 3.57 MIL/uL — ABNORMAL LOW (ref 3.87–5.11)
RDW: 14.4 % (ref 11.5–15.5)
WBC: 10.4 10*3/uL (ref 4.0–10.5)
nRBC: 0.4 % — ABNORMAL HIGH (ref 0.0–0.2)

## 2019-10-07 LAB — NOVEL CORONAVIRUS, NAA (HOSP ORDER, SEND-OUT TO REF LAB; TAT 18-24 HRS): SARS-CoV-2, NAA: NOT DETECTED

## 2019-10-07 NOTE — Anesthesia Preprocedure Evaluation (Addendum)
Anesthesia Evaluation  Patient identified by MRN, date of birth, ID band Patient awake    Reviewed: Allergy & Precautions, NPO status , Patient's Chart, lab work & pertinent test results  History of Anesthesia Complications (+) PONV and history of anesthetic complications  Airway Mallampati: II  TM Distance: >3 FB Neck ROM: Full    Dental no notable dental hx.    Pulmonary neg pulmonary ROS,    Pulmonary exam normal        Cardiovascular negative cardio ROS Normal cardiovascular exam     Neuro/Psych Anxiety Depression negative neurological ROS     GI/Hepatic Neg liver ROS, hiatal hernia, GERD  ,  Endo/Other  negative endocrine ROS  Renal/GU negative Renal ROS  negative genitourinary   Musculoskeletal  (+) Fibromyalgia -, narcotic dependent  Abdominal   Peds  Hematology  (+) anemia , Hgb 10.7   Anesthesia Other Findings Day of surgery medications reviewed with patient.  Reproductive/Obstetrics negative OB ROS                            Anesthesia Physical Anesthesia Plan  ASA: II  Anesthesia Plan: General   Post-op Pain Management: GA combined w/ Regional for post-op pain   Induction: Intravenous  PONV Risk Score and Plan: Treatment may vary due to age or medical condition, Propofol infusion, Ondansetron, Dexamethasone, Scopolamine patch - Pre-op and Midazolam  Airway Management Planned: Oral ETT  Additional Equipment: None  Intra-op Plan:   Post-operative Plan: Extubation in OR  Informed Consent: I have reviewed the patients History and Physical, chart, labs and discussed the procedure including the risks, benefits and alternatives for the proposed anesthesia with the patient or authorized representative who has indicated his/her understanding and acceptance.     Dental advisory given  Plan Discussed with: CRNA  Anesthesia Plan Comments: (PAT note written 10/07/2019 by  Myra Gianotti, PA-C. )       Anesthesia Quick Evaluation

## 2019-10-07 NOTE — Progress Notes (Signed)
Anesthesia Chart Review:  Case: [id number] Date/Time: 10/09/19 1230   Procedure: RIGHT MASTECTOMY MODIFIED RADICAL AND LEFT TOTAL MASTECTOMY (Bilateral Breast)   Anesthesia type: General   Pre-op diagnosis: BILATERAL BREAST CANCER   Location: [facility] OR ROOM 09 / [place] [facility]   Surgeon: Rolm Bookbinder, MD      DISCUSSION: Patient is a 63 year old female scheduled for the above procedure. 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 She is s/p 3 cycles of chemotherapy, but without much of a clinical response, so oncology discussed with [name] with plans to move forward with bilateral mastectomy. She is s/p left IJ Infuse-a-Port by [name], MD on 08/06/19 (after unsuccessful attempt on OR 08/05/19 due to inability to advance wire past the left innominate vein and unable to cannulate left subclavian vein).   History includes never smoker, postoperative N/V, murmur (MVP with mild MR 07/2019 echo), palpitations, IBS, fibromyalgia, anxiety, interstitial cystitis, GERD, hiatal hernia, HLD, anemia (chemotherapy induced).  Last seen by cardiologist Dr. [name] on 08/07/19. Recent echo showed normal LVEF, mild MVP/mild MR. Non-ischemic stress test in 2019. Frequent PVCs, no pauses on 2019 Holter. He will continue to follow her ~ every 3 monthos while on chemotherapy.  Last chemotherapy 09/22/19.  Medications included Palonosetron, Emend, Keytruda, Doxorubicin, Cytoxan. Received Udenyca on 09/23/19. H/H 10.7/33.1, PLT 216 on 10/07/19.  10/06/19 COVID-19 test negative. If no acute changes then I would anticipate that she can proceed as planned.   VS: BP (!) 135/55   Pulse 80   Temp 37.1 C (Oral)   Resp 15   Ht 5\' 7"  (1.702 m)   Wt 69 kg   SpO2 100%   BMI 23.84 kg/m     PROVIDERS: Tonia Ghent, MD his PCP Bartholome Bill, MD is cardiologist [name]; see [facility]  Everywhere)  Randa Evens, MD is [name], MD is pulmonologist. Seen in 08/2018 for daytime sleepiness. Home sleep test ordered, but did not show sleep apnea.    LABS: Labs reviewed: Acceptable for surgery. Also had labs at [facility] on 10/07/19 which showed Cr 0.76, normal LFTs, H/H 10.7/33.1, PLT 216.  (all labs ordered are listed, but only abnormal results are displayed)  Labs Reviewed  BASIC METABOLIC PANEL - Abnormal; Notable for the following components:      Result Value   Glucose, Bld 115 (*)    Creatinine, Ser 1.12 (*)    GFR calc non Af Amer 53 (*)    All other components within normal limits  CBC - Abnormal; Notable for the following components:   WBC 11.5 (*)    RBC 3.65 (*)    Hemoglobin 10.9 (*)    HCT 35.1 (*)    nRBC 0.5 (*)    All other components within normal limits     IMAGES: PET Scan 08/06/19: IMPRESSION: 1. Hypermetabolic lateral right breast primary with right axillary nodal metastasis. Possible satellite nodule just cephalad to the lateral right breast lesion. 2. Equivocal inferior left breast and left axillary nodal hypermetabolism. Please see MRI report of 07/31/2019. 3. No evidence of extrathoracic hypermetabolic metastasis. 4. Sequelae of failed left Port-A-Cath, as on clinical history.  PCXR 08/05/19: FINDINGS: Cardiomegaly. Both lungs are clear. The visualized skeletal structures are unremarkable. IMPRESSION: Cardiomegaly without acute abnormality of the lungs in AP portable projection. No pneumothorax or pleural. No unexpected radiopaque foreign  body about the chest following attempted Port-A-Cath placement.   EKG: 10/06/19: Sinus rhythm with occasional Premature ventricular complexes Left atrial abnormality No old tracing to compare Confirmed by Levin Erp [phone]) on 10/06/2019 9:29:01 PM   CV: Echo 08/25/19 ([place]): INTERPRETATION NORMAL LEFT VENTRICULAR SYSTOLIC FUNCTION  WITH MILD LVH NORMAL RIGHT  VENTRICULAR SYSTOLIC FUNCTION MILD VALVULAR REGURGITATION (Mild MR, Mild TR, Mild PR) NO VALVULAR STENOSIS Closest EF: >55% (Estimated) LVH: MILD LVH Mitral: MILD MR Tricuspid: MILD TR Morphology: PROLAPSE   MUGA scan 08/01/19: IMPRESSION: Normal LEFT ventricular ejection fraction of 57.2%. Normal LV wall motion.   Nuclear stress test 04/12/28 ([place]): Result Impression: Negative Lexiscan stress. LV function normal. No ischemia.    24-48 Hour Holter Monitor 01/23/18 ([place]): Summary: 1. nsr with frequent pvcs . PVC burdon is 7%. NO sustained svt or vt. . No significant puases.    Past Medical History:  Diagnosis Date  . Anemia    d/t chemo - last Hgb 10.8  . Anxiety    sees Dr. [name]  . Breast cancer ([place]) 2020  . Cyst of breast    per [name]  . Dysrhythmia    Hx of palpitations  . Family history of breast cancer   . Family history of colon cancer   . Family history of leukemia   . Family history of ovarian cancer   . Fibromyalgia   . GERD (gastroesophageal reflux disease)   . Heart murmur   . Hemorrhoids   . Herpes, genital 04/2015   confirmed with HSV 2 IgG  . Hiatal hernia   . Hypercholesterolemia    Dr. Kyra Searles  . Hyperlipidemia   . IBS (irritable bowel syndrome)    constipation predominant  . IC (interstitial cystitis)    per [facility]  . Mild depression ([place])   . MVP (mitral valve prolapse)    [facility] cards eval 2012  . Osteopenia    2010/2017, DEXA at [facility];spine and fem neck  . PONV (postoperative nausea and vomiting)   . Vitamin D deficiency    history of    Past Surgical History:  Procedure Laterality Date  . ABDOMINAL HYSTERECTOMY    . APPENDECTOMY    . BLADDER SURGERY     1980's  . BREAST EXCISIONAL BIOPSY Right    benign  . BREAST EXCISIONAL BIOPSY Bilateral    benign  . COLONOSCOPY  09/2014   Dr. [name]  . COLONOSCOPY  2009   at [facility] 1 POLYP (BENIGN)  . excision of breast cysts     hx of  multiple cyst aspirations  . EXPLORATORY LAPAROTOMY    . PORTA CATH INSERTION N/A 08/06/2019   Procedure: PORTA CATH INSERTION;  Surgeon: Katha Cabal, MD;  Location: [facility] CV LAB;  Service: Cardiovascular;  Laterality: N/A;  . PORTACATH PLACEMENT Left 08/05/2019   Procedure: INSERTION PORT-A-CATH, Attempted;  Surgeon: Jules Husbands, MD;  Location: [facility] ORS;  Service: General;  Laterality: Left;  . TUBAL LIGATION      MEDICATIONS: . acetaminophen (TYLENOL) 650 MG CR tablet  . Ascorbic Acid (VITAMIN C) 100 MG tablet  . atorvastatin (LIPITOR) 40 MG tablet  . azithromycin (ZITHROMAX) 250 MG tablet  . cholecalciferol (VITAMIN D3) 25 MCG (1000 UT) tablet  . clonazePAM (KLONOPIN) 0.5 MG tablet  . dexamethasone (DECADRON) 4 MG tablet  . DULoxetine (CYMBALTA) 60 MG capsule  . gabapentin (NEURONTIN) 100 MG capsule  . HYDROcodone-acetaminophen (NORCO/VICODIN) 5-325 MG tablet  .  lidocaine-prilocaine (EMLA) cream  . loratadine (CLARITIN) 10 MG tablet  . magic mouthwash w/lidocaine SOLN  . omeprazole (PRILOSEC) 20 MG capsule  . ondansetron (ZOFRAN) 8 MG tablet  . Plecanatide (TRULANCE) 3 MG TABS  . prochlorperazine (COMPAZINE) 10 MG tablet  . sennosides-docusate sodium (SENOKOT-S) 8.6-50 MG tablet  . valACYclovir (VALTREX) 500 MG tablet  . vitamin B-12 (CYANOCOBALAMIN) 500 MCG tablet   No current facility-administered medications for this encounter.     Myra Gianotti, PA-C Surgical Short Stay/Anesthesiology [facility] Phone [phone] [facility] Phone [phone] 10/07/2019 2:46 PM

## 2019-10-09 ENCOUNTER — Observation Stay (HOSPITAL_COMMUNITY)
Admission: RE | Admit: 2019-10-09 | Discharge: 2019-10-10 | Disposition: A | Discharge status: Home / Self Care | Payer: Medicare HMO | Attending: General Surgery | Admitting: General Surgery

## 2019-10-09 ENCOUNTER — Ambulatory Visit (HOSPITAL_COMMUNITY): Payer: Medicare HMO | Admitting: Vascular Surgery

## 2019-10-09 ENCOUNTER — Telehealth: Payer: Self-pay | Admitting: *Deleted

## 2019-10-09 ENCOUNTER — Ambulatory Visit (HOSPITAL_COMMUNITY): Payer: Medicare HMO | Admitting: Anesthesiology

## 2019-10-09 ENCOUNTER — Other Ambulatory Visit: Payer: Self-pay

## 2019-10-09 ENCOUNTER — Encounter (HOSPITAL_COMMUNITY)
Admission: RE | Disposition: A | Discharge status: Home / Self Care | Payer: Self-pay | Source: Home / Self Care | Attending: General Surgery

## 2019-10-09 DIAGNOSIS — Z881 Allergy status to other antibiotic agents status: Secondary | ICD-10-CM | POA: Insufficient documentation

## 2019-10-09 DIAGNOSIS — E785 Hyperlipidemia, unspecified: Secondary | ICD-10-CM | POA: Diagnosis not present

## 2019-10-09 DIAGNOSIS — Z79890 Hormone replacement therapy (postmenopausal): Secondary | ICD-10-CM | POA: Diagnosis not present

## 2019-10-09 DIAGNOSIS — F419 Anxiety disorder, unspecified: Secondary | ICD-10-CM | POA: Diagnosis not present

## 2019-10-09 DIAGNOSIS — C50911 Malignant neoplasm of unspecified site of right female breast: Secondary | ICD-10-CM | POA: Diagnosis present

## 2019-10-09 DIAGNOSIS — Z886 Allergy status to analgesic agent status: Secondary | ICD-10-CM | POA: Insufficient documentation

## 2019-10-09 DIAGNOSIS — R69 Illness, unspecified: Secondary | ICD-10-CM | POA: Diagnosis not present

## 2019-10-09 DIAGNOSIS — M797 Fibromyalgia: Secondary | ICD-10-CM | POA: Diagnosis not present

## 2019-10-09 DIAGNOSIS — Z171 Estrogen receptor negative status [ER-]: Secondary | ICD-10-CM | POA: Insufficient documentation

## 2019-10-09 DIAGNOSIS — C50411 Malignant neoplasm of upper-outer quadrant of right female breast: Secondary | ICD-10-CM | POA: Diagnosis not present

## 2019-10-09 DIAGNOSIS — C773 Secondary and unspecified malignant neoplasm of axilla and upper limb lymph nodes: Secondary | ICD-10-CM | POA: Diagnosis not present

## 2019-10-09 DIAGNOSIS — Z882 Allergy status to sulfonamides status: Secondary | ICD-10-CM | POA: Insufficient documentation

## 2019-10-09 DIAGNOSIS — F329 Major depressive disorder, single episode, unspecified: Secondary | ICD-10-CM | POA: Insufficient documentation

## 2019-10-09 DIAGNOSIS — D0512 Intraductal carcinoma in situ of left breast: Secondary | ICD-10-CM | POA: Diagnosis not present

## 2019-10-09 DIAGNOSIS — Z79899 Other long term (current) drug therapy: Secondary | ICD-10-CM | POA: Insufficient documentation

## 2019-10-09 DIAGNOSIS — Z888 Allergy status to other drugs, medicaments and biological substances status: Secondary | ICD-10-CM | POA: Insufficient documentation

## 2019-10-09 DIAGNOSIS — Z880 Allergy status to penicillin: Secondary | ICD-10-CM | POA: Insufficient documentation

## 2019-10-09 DIAGNOSIS — N6042 Mammary duct ectasia of left breast: Secondary | ICD-10-CM | POA: Diagnosis not present

## 2019-10-09 DIAGNOSIS — R921 Mammographic calcification found on diagnostic imaging of breast: Secondary | ICD-10-CM | POA: Diagnosis not present

## 2019-10-09 DIAGNOSIS — C50912 Malignant neoplasm of unspecified site of left female breast: Secondary | ICD-10-CM | POA: Diagnosis present

## 2019-10-09 DIAGNOSIS — Z803 Family history of malignant neoplasm of breast: Secondary | ICD-10-CM | POA: Diagnosis not present

## 2019-10-09 HISTORY — PX: MASTECTOMY MODIFIED RADICAL: SHX5962

## 2019-10-09 SURGERY — MASTECTOMY, MODIFIED RADICAL
Anesthesia: General | Site: Breast | Laterality: Bilateral

## 2019-10-09 MED ORDER — FENTANYL CITRATE (PF) 100 MCG/2ML IJ SOLN
25.0000 ug | INTRAMUSCULAR | Status: DC | PRN
  Administered 2019-10-09: 50 ug via INTRAVENOUS

## 2019-10-09 MED ORDER — ACETAMINOPHEN 500 MG PO TABS: 1000.0000 mg | ORAL_TABLET | ORAL | Status: AC

## 2019-10-09 MED ORDER — ACETAMINOPHEN 500 MG PO TABS
ORAL_TABLET | ORAL | Status: AC
  Filled 2019-10-09: qty 2

## 2019-10-09 MED ORDER — ROCURONIUM BROMIDE 10 MG/ML (PF) SYRINGE
PREFILLED_SYRINGE | INTRAVENOUS | Status: AC
  Filled 2019-10-09: qty 10

## 2019-10-09 MED ORDER — CIPROFLOXACIN IN D5W 400 MG/200ML IV SOLN
400.0000 mg | INTRAVENOUS | Status: AC
  Administered 2019-10-09: 400 mg via INTRAVENOUS

## 2019-10-09 MED ORDER — SODIUM CHLORIDE 0.9 % IV SOLN
INTRAVENOUS | Status: DC
  Administered 2019-10-09: 18:00:00 via INTRAVENOUS

## 2019-10-09 MED ORDER — ENSURE PRE-SURGERY PO LIQD: 296.0000 mL | Freq: Once | ORAL | Status: DC

## 2019-10-09 MED ORDER — PROPOFOL 10 MG/ML IV BOLUS
INTRAVENOUS | Status: DC | PRN
  Administered 2019-10-09: 20 mg via INTRAVENOUS
  Administered 2019-10-09 (×2): 30 mg via INTRAVENOUS
  Administered 2019-10-09: 140 mg via INTRAVENOUS

## 2019-10-09 MED ORDER — DEXAMETHASONE SODIUM PHOSPHATE 10 MG/ML IJ SOLN
INTRAMUSCULAR | Status: AC
  Filled 2019-10-09: qty 1

## 2019-10-09 MED ORDER — SUGAMMADEX SODIUM 200 MG/2ML IV SOLN
INTRAVENOUS | Status: DC | PRN
  Administered 2019-10-09: 150 mg via INTRAVENOUS

## 2019-10-09 MED ORDER — PLECANATIDE 3 MG PO TABS: 3.0000 mg | ORAL_TABLET | ORAL | Status: DC

## 2019-10-09 MED ORDER — PHENYLEPHRINE 40 MCG/ML (10ML) SYRINGE FOR IV PUSH (FOR BLOOD PRESSURE SUPPORT)
PREFILLED_SYRINGE | INTRAVENOUS | Status: AC
  Filled 2019-10-09: qty 10

## 2019-10-09 MED ORDER — LORATADINE 10 MG PO TABS
10.0000 mg | ORAL_TABLET | Freq: Every day | ORAL | Status: DC
  Administered 2019-10-10: 10 mg via ORAL
  Filled 2019-10-09: qty 1

## 2019-10-09 MED ORDER — METHOCARBAMOL 500 MG PO TABS
500.0000 mg | ORAL_TABLET | Freq: Four times a day (QID) | ORAL | Status: DC | PRN
  Administered 2019-10-10: 500 mg via ORAL
  Filled 2019-10-09: qty 1

## 2019-10-09 MED ORDER — PANTOPRAZOLE SODIUM 40 MG PO TBEC
40.0000 mg | DELAYED_RELEASE_TABLET | Freq: Every day | ORAL | Status: DC
  Administered 2019-10-10: 40 mg via ORAL
  Filled 2019-10-09: qty 1

## 2019-10-09 MED ORDER — ONDANSETRON HCL 4 MG/2ML IJ SOLN
INTRAMUSCULAR | Status: AC
  Filled 2019-10-09: qty 2

## 2019-10-09 MED ORDER — PHENYLEPHRINE HCL-NACL 10-0.9 MG/250ML-% IV SOLN
INTRAVENOUS | Status: DC | PRN
  Administered 2019-10-09: 20 ug/min via INTRAVENOUS

## 2019-10-09 MED ORDER — OXYCODONE HCL 5 MG PO TABS
5.0000 mg | ORAL_TABLET | ORAL | Status: DC | PRN
  Administered 2019-10-10: 5 mg via ORAL
  Filled 2019-10-09: qty 2

## 2019-10-09 MED ORDER — KETAMINE HCL 50 MG/5ML IJ SOSY
PREFILLED_SYRINGE | INTRAMUSCULAR | Status: AC
  Filled 2019-10-09: qty 5

## 2019-10-09 MED ORDER — PHENYLEPHRINE 40 MCG/ML (10ML) SYRINGE FOR IV PUSH (FOR BLOOD PRESSURE SUPPORT)
PREFILLED_SYRINGE | INTRAVENOUS | Status: DC | PRN
  Administered 2019-10-09 (×3): 80 ug via INTRAVENOUS
  Administered 2019-10-09: 120 ug via INTRAVENOUS
  Administered 2019-10-09: 40 ug via INTRAVENOUS
  Administered 2019-10-09: 80 ug via INTRAVENOUS

## 2019-10-09 MED ORDER — ONDANSETRON HCL 4 MG/2ML IJ SOLN
INTRAMUSCULAR | Status: DC | PRN
  Administered 2019-10-09: 4 mg via INTRAVENOUS

## 2019-10-09 MED ORDER — DEXAMETHASONE SODIUM PHOSPHATE 10 MG/ML IJ SOLN
INTRAMUSCULAR | Status: DC | PRN
  Administered 2019-10-09: 4 mg via INTRAVENOUS

## 2019-10-09 MED ORDER — DULOXETINE HCL 60 MG PO CPEP
60.0000 mg | ORAL_CAPSULE | Freq: Every day | ORAL | Status: DC
  Administered 2019-10-10: 60 mg via ORAL
  Filled 2019-10-09: qty 1

## 2019-10-09 MED ORDER — PROPOFOL 500 MG/50ML IV EMUL
INTRAVENOUS | Status: DC | PRN
  Administered 2019-10-09: 50 ug/kg/min via INTRAVENOUS

## 2019-10-09 MED ORDER — CLONAZEPAM 0.5 MG PO TABS
0.5000 mg | ORAL_TABLET | Freq: Two times a day (BID) | ORAL | Status: DC
  Administered 2019-10-09 – 2019-10-10 (×2): 0.5 mg via ORAL
  Filled 2019-10-09 (×2): qty 1

## 2019-10-09 MED ORDER — LACTATED RINGERS IV SOLN
INTRAVENOUS | Status: DC
  Administered 2019-10-09: 12:00:00 via INTRAVENOUS

## 2019-10-09 MED ORDER — FENTANYL CITRATE (PF) 100 MCG/2ML IJ SOLN
INTRAMUSCULAR | Status: AC
  Administered 2019-10-09: 50 ug via INTRAVENOUS
  Filled 2019-10-09: qty 2

## 2019-10-09 MED ORDER — ALBUMIN HUMAN 5 % IV SOLN
12.5000 g | Freq: Once | INTRAVENOUS | Status: AC
  Administered 2019-10-09: 12.5 g via INTRAVENOUS

## 2019-10-09 MED ORDER — 0.9 % SODIUM CHLORIDE (POUR BTL) OPTIME
TOPICAL | Status: DC | PRN
  Administered 2019-10-09: 1000 mL

## 2019-10-09 MED ORDER — PROPOFOL 10 MG/ML IV BOLUS
INTRAVENOUS | Status: AC
  Filled 2019-10-09: qty 20

## 2019-10-09 MED ORDER — CIPROFLOXACIN IN D5W 400 MG/200ML IV SOLN
INTRAVENOUS | Status: AC
  Filled 2019-10-09: qty 200

## 2019-10-09 MED ORDER — SCOPOLAMINE 1 MG/3DAYS TD PT72
MEDICATED_PATCH | TRANSDERMAL | Status: DC | PRN
  Administered 2019-10-09: 1 via TRANSDERMAL

## 2019-10-09 MED ORDER — FENTANYL CITRATE (PF) 100 MCG/2ML IJ SOLN
50.0000 ug | Freq: Once | INTRAMUSCULAR | Status: AC
  Administered 2019-10-09: 12:00:00 50 ug via INTRAVENOUS

## 2019-10-09 MED ORDER — FENTANYL CITRATE (PF) 250 MCG/5ML IJ SOLN
INTRAMUSCULAR | Status: AC
  Filled 2019-10-09: qty 5

## 2019-10-09 MED ORDER — MORPHINE SULFATE (PF) 2 MG/ML IV SOLN: 2.0000 mg | INTRAVENOUS | Status: DC | PRN

## 2019-10-09 MED ORDER — ENOXAPARIN SODIUM 40 MG/0.4ML ~~LOC~~ SOLN: 40.0000 mg | SUBCUTANEOUS | Status: DC

## 2019-10-09 MED ORDER — GABAPENTIN 100 MG PO CAPS
ORAL_CAPSULE | ORAL | Status: AC
  Administered 2019-10-09: 100 mg via ORAL
  Filled 2019-10-09: qty 1

## 2019-10-09 MED ORDER — FENTANYL CITRATE (PF) 100 MCG/2ML IJ SOLN
INTRAMUSCULAR | Status: AC
  Filled 2019-10-09: qty 2

## 2019-10-09 MED ORDER — ONDANSETRON HCL 4 MG/2ML IJ SOLN
4.0000 mg | Freq: Four times a day (QID) | INTRAMUSCULAR | Status: DC | PRN
  Administered 2019-10-09 (×2): 4 mg via INTRAVENOUS
  Filled 2019-10-09 (×2): qty 2

## 2019-10-09 MED ORDER — KETAMINE HCL 50 MG/ML IJ SOLN
INTRAMUSCULAR | Status: DC | PRN
  Administered 2019-10-09 (×2): 10 mg via INTRAMUSCULAR
  Administered 2019-10-09: 30 mg via INTRAMUSCULAR

## 2019-10-09 MED ORDER — ACETAMINOPHEN 500 MG PO TABS
1000.0000 mg | ORAL_TABLET | Freq: Four times a day (QID) | ORAL | Status: DC
  Administered 2019-10-09 – 2019-10-10 (×2): 1000 mg via ORAL
  Filled 2019-10-09 (×2): qty 2

## 2019-10-09 MED ORDER — MIDAZOLAM HCL 2 MG/2ML IJ SOLN
1.0000 mg | Freq: Once | INTRAMUSCULAR | Status: AC
  Administered 2019-10-09: 12:00:00 1 mg via INTRAVENOUS

## 2019-10-09 MED ORDER — LIDOCAINE 2% (20 MG/ML) 5 ML SYRINGE
INTRAMUSCULAR | Status: DC | PRN
  Administered 2019-10-09 (×2): 40 mg via INTRAVENOUS

## 2019-10-09 MED ORDER — ALBUMIN HUMAN 5 % IV SOLN
INTRAVENOUS | Status: AC
  Filled 2019-10-09: qty 250

## 2019-10-09 MED ORDER — SENNOSIDES-DOCUSATE SODIUM 8.6-50 MG PO TABS
1.0000 | ORAL_TABLET | Freq: Every day | ORAL | Status: DC
  Administered 2019-10-09: 1 via ORAL
  Filled 2019-10-09: qty 1

## 2019-10-09 MED ORDER — LIDOCAINE 2% (20 MG/ML) 5 ML SYRINGE
INTRAMUSCULAR | Status: AC
  Filled 2019-10-09: qty 5

## 2019-10-09 MED ORDER — GABAPENTIN 100 MG PO CAPS
100.0000 mg | ORAL_CAPSULE | Freq: Three times a day (TID) | ORAL | Status: DC
  Administered 2019-10-09 – 2019-10-10 (×2): 100 mg via ORAL
  Filled 2019-10-09 (×2): qty 1

## 2019-10-09 MED ORDER — LACTATED RINGERS IV SOLN
INTRAVENOUS | Status: DC | PRN
  Administered 2019-10-09: 11:00:00 via INTRAVENOUS

## 2019-10-09 MED ORDER — ROCURONIUM BROMIDE 10 MG/ML (PF) SYRINGE
PREFILLED_SYRINGE | INTRAVENOUS | Status: DC | PRN
  Administered 2019-10-09: 50 mg via INTRAVENOUS

## 2019-10-09 MED ORDER — MIDAZOLAM HCL 2 MG/2ML IJ SOLN
INTRAMUSCULAR | Status: AC
  Administered 2019-10-09: 1 mg via INTRAVENOUS
  Filled 2019-10-09: qty 2

## 2019-10-09 MED ORDER — FENTANYL CITRATE (PF) 250 MCG/5ML IJ SOLN
INTRAMUSCULAR | Status: DC | PRN
  Administered 2019-10-09: 50 ug via INTRAVENOUS
  Administered 2019-10-09: 25 ug via INTRAVENOUS

## 2019-10-09 MED ORDER — ONDANSETRON 4 MG PO TBDP: 4.0000 mg | ORAL_TABLET | Freq: Four times a day (QID) | ORAL | Status: DC | PRN

## 2019-10-09 MED ORDER — GABAPENTIN 100 MG PO CAPS
100.0000 mg | ORAL_CAPSULE | ORAL | Status: AC
  Administered 2019-10-09: 12:00:00 100 mg via ORAL

## 2019-10-09 SURGICAL SUPPLY — 47 items
APPLIER CLIP 9.375 MED OPEN (MISCELLANEOUS) ×4
BINDER BREAST LRG (GAUZE/BANDAGES/DRESSINGS) IMPLANT
BINDER BREAST XLRG (GAUZE/BANDAGES/DRESSINGS) ×4 IMPLANT
BIOPATCH RED 1 DISK 7.0 (GAUZE/BANDAGES/DRESSINGS) ×4 IMPLANT
CANISTER SUCT 3000ML PPV (MISCELLANEOUS) ×2 IMPLANT
CHLORAPREP W/TINT 26 (MISCELLANEOUS) ×2 IMPLANT
CLIP APPLIE 9.375 MED OPEN (MISCELLANEOUS) ×2 IMPLANT
CONT SPEC 4OZ CLIKSEAL STRL BL (MISCELLANEOUS) IMPLANT
COVER SURGICAL LIGHT HANDLE (MISCELLANEOUS) ×2 IMPLANT
COVER WAND RF STERILE (DRAPES) ×2 IMPLANT
DERMABOND ADVANCED (GAUZE/BANDAGES/DRESSINGS) ×5
DERMABOND ADVANCED .7 DNX12 (GAUZE/BANDAGES/DRESSINGS) ×5 IMPLANT
DRAIN CHANNEL 19F RND (DRAIN) ×6 IMPLANT
DRAPE LAPAROSCOPIC ABDOMINAL (DRAPES) ×2 IMPLANT
DRSG TEGADERM 4X4.75 (GAUZE/BANDAGES/DRESSINGS) ×4 IMPLANT
ELECT REM PT RETURN 9FT ADLT (ELECTROSURGICAL) ×2
ELECTRODE REM PT RTRN 9FT ADLT (ELECTROSURGICAL) ×1 IMPLANT
EVACUATOR SILICONE 100CC (DRAIN) ×6 IMPLANT
GAUZE SPONGE 4X4 12PLY STRL (GAUZE/BANDAGES/DRESSINGS) ×2 IMPLANT
GLOVE BIO SURGEON STRL SZ7 (GLOVE) ×2 IMPLANT
GLOVE BIOGEL PI IND STRL 7.5 (GLOVE) ×1 IMPLANT
GLOVE BIOGEL PI INDICATOR 7.5 (GLOVE) ×1
GOWN STRL REUS W/ TWL LRG LVL3 (GOWN DISPOSABLE) ×3 IMPLANT
GOWN STRL REUS W/TWL LRG LVL3 (GOWN DISPOSABLE) ×3
KIT BASIN OR (CUSTOM PROCEDURE TRAY) ×2 IMPLANT
KIT TURNOVER KIT B (KITS) ×2 IMPLANT
MARKER SKIN DUAL TIP RULER LAB (MISCELLANEOUS) ×2 IMPLANT
NS IRRIG 1000ML POUR BTL (IV SOLUTION) ×2 IMPLANT
PACK GENERAL/GYN (CUSTOM PROCEDURE TRAY) ×2 IMPLANT
PAD ABD 8X10 STRL (GAUZE/BANDAGES/DRESSINGS) ×4 IMPLANT
PAD ARMBOARD 7.5X6 YLW CONV (MISCELLANEOUS) ×4 IMPLANT
PENCIL SMOKE EVACUATOR (MISCELLANEOUS) ×2 IMPLANT
SPECIMEN JAR X LARGE (MISCELLANEOUS) ×4 IMPLANT
SPONGE LAP 18X18 RF (DISPOSABLE) ×2 IMPLANT
STAPLER VISISTAT 35W (STAPLE) IMPLANT
STRIP CLOSURE SKIN 1/2X4 (GAUZE/BANDAGES/DRESSINGS) ×6 IMPLANT
SUT ETHILON 2 0 FS 18 (SUTURE) ×8 IMPLANT
SUT MNCRL AB 4-0 PS2 18 (SUTURE) ×8 IMPLANT
SUT MON AB 4-0 PC3 18 (SUTURE) ×2 IMPLANT
SUT VIC AB 2-0 SH 18 (SUTURE) ×4 IMPLANT
SUT VIC AB 3-0 54X BRD REEL (SUTURE) ×1 IMPLANT
SUT VIC AB 3-0 BRD 54 (SUTURE) ×1
SUT VIC AB 3-0 SH 27 (SUTURE) ×1
SUT VIC AB 3-0 SH 27XBRD (SUTURE) ×1 IMPLANT
SUT VIC AB 3-0 SH 8-18 (SUTURE) ×10 IMPLANT
TOWEL GREEN STERILE (TOWEL DISPOSABLE) ×2 IMPLANT
TOWEL GREEN STERILE FF (TOWEL DISPOSABLE) ×2 IMPLANT

## 2019-10-09 NOTE — Brief Op Note (Signed)
10/09/2019  4:04 PM  PATIENT:  Debra Little  63 y.o. female  PRE-OPERATIVE DIAGNOSIS:  BILATERAL BREAST CANCER  POST-OPERATIVE DIAGNOSIS:  BILATERAL BREAST CANCER  PROCEDURE:  Procedure(s): RIGHT MASTECTOMY MODIFIED RADICAL AND LEFT TOTAL MASTECTOMY (Bilateral)  SURGEON:  Surgeon(s) and Role:    Rolm Bookbinder, MD - Primary  PHYSICIAN ASSISTANT:   ASSISTANTS: Ewell Poe rnfa   ANESTHESIA:   general with bilateral pec block  EBL:  100 mL   BLOOD ADMINISTERED:none  DRAINS: 2 45 Fr Blake drains to right and one to left   LOCAL MEDICATIONS USED:  NONE  SPECIMEN:  Modified Radical Mastectomy  DISPOSITION OF SPECIMEN:  PATHOLOGY  COUNTS:  YES  TOURNIQUET:  * No tourniquets in log *  DICTATION: .Dragon Dictation  PLAN OF CARE: Admit for overnight observation  PATIENT DISPOSITION:  PACU - hemodynamically stable.   Delay start of Pharmacological VTE agent (>24hrs) due to surgical blood loss or risk of bleeding: no

## 2019-10-09 NOTE — Telephone Encounter (Signed)
Patient called answering service and states she wants to cancel her surgery 11/12, she is stressed and does not think she can go through with it.

## 2019-10-09 NOTE — Anesthesia Procedure Notes (Signed)
Procedure Name: Intubation Performed by: Milford Cage, CRNA Pre-anesthesia Checklist: Patient identified, Emergency Drugs available, Suction available and Patient being monitored Patient Re-evaluated:Patient Re-evaluated prior to induction Oxygen Delivery Method: Circle System Utilized Preoxygenation: Pre-oxygenation with 100% oxygen Induction Type: IV induction Ventilation: Mask ventilation without difficulty Laryngoscope Size: Miller and 2 Grade View: Grade I Tube type: Oral Tube size: 7.0 mm Number of attempts: 1 Airway Equipment and Method: Stylet and Oral airway Placement Confirmation: ETT inserted through vocal cords under direct vision,  positive ETCO2 and breath sounds checked- equal and bilateral Secured at: 21 cm Tube secured with: Tape Dental Injury: Teeth and Oropharynx as per pre-operative assessment

## 2019-10-09 NOTE — Transfer of Care (Signed)
Immediate Anesthesia Transfer of Care Note  Patient: Debra Little  Procedure(s) Performed: RIGHT MASTECTOMY MODIFIED RADICAL AND LEFT TOTAL MASTECTOMY (Bilateral Breast)  Patient Location: PACU  Anesthesia Type:GA combined with regional for post-op pain  Level of Consciousness: drowsy  Airway & Oxygen Therapy: Patient Spontanous Breathing and Patient connected to face mask oxygen  Post-op Assessment: Report given to RN and Post -op Vital signs reviewed and stable  Post vital signs: Reviewed and stable  Last Vitals:  Vitals Value Taken Time  BP 115/54 10/09/19 1601  Temp 37 C 10/09/19 1600  Pulse 80 10/09/19 1603  Resp 18 10/09/19 1603  SpO2 100 % 10/09/19 1603  Vitals shown include unvalidated device data.  Last Pain:  Vitals:   10/09/19 1205  TempSrc:   PainSc: 0-No pain      Patients Stated Pain Goal: 3 ([id number])  Complications: No apparent anesthesia complications

## 2019-10-09 NOTE — Op Note (Signed)
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 Right MRM 2. Left mastectomy Surgeon: Dr Serita Grammes Asst Ewell Poe RNFA Specimens: 1. Right mrm short superior, long axillary contents 2. Left mastectomy short superior, long lateral Drains: 1. 2 19 Fr blake drains to right side 2. 1 19 Fr blake drain to left side Complications none Sponge and needle count correct times two dispo to recovery stable  Indications:  38 yof who presented with a left breast mass. On MRI this is a 3.5x2.7x3.4 cm with most of lateral breast involved with abnormal lymph nodes as well. She has also undergone a biopsy of nme that is dcis.  The right side is [facility] with focal metaplastic differentiation and is TN.  She began chemotherapy and this area is progressed and we decided to proceed with surgery.  Procedure:  After informed consent obtained she underwent pectoral blocks.  She was given antibiotics. SCDs were in place.  She was placed under general anesthesia.  She was prepped and draped in the standard sterile surgical fashion a timeout was performed.  I first did the right MRM. I made an elliptical incision and carried this into the axilla.  I then created flaps to the parasternal area, clavicle, IM fold and the latissimus. The breast and the fascia were then removed from the pectoralis muscle and rolled laterally. I then entered into the axilla. I identified the axillary vein, thoracodorsal bundle and the long thoracic nerve. I swept the contents caudad and removed all then nodal tissue. She had numerous mobile abnormal nodes present.  Hemostasis was observed. I then placed two 19 Fr Blake drains and secured these with 2-0 nylon. I closed the lateral tissue down with 2-0 vicryl. I then closed the dermis with 3-0 vicryl and the skin with 4-0 monocryl. Glue and steristrips were applied.  I then created a similar  incision on the left just not into the axilla.  I created the same flaps and removed the breast and fascia from the pectoralis muscle. I then placed one 19 Fr Blake drain.  I closed with 3--0 vicryl and 4-0 monocryl. Glue and steristrips were applied. She tolerated well, was extubated and transferred to recovery stable.

## 2019-10-09 NOTE — H&P (Signed)
5 yof referred by Dr Janese Banks for new left breast cancer that I saw a couple months ago. she has prior history of multiple cysts excised. she has fh of breast cancer in her mom late 83s as well as ovarian cancer in a maternal aunt. she had nl mm in february of this year. she noted some discoloration inferior to left nac and pain. she then developed a mass that she saw Dr Dahlia Byes for. she underwent imaging. this shows c density breasts. there is 2.3 cm left breast mass and at least 4 abnl ax nodes present on. MRI shows a 3.5x2.7x3.4 cm with most of lateral right breast involved. there are three abnormal nodes on the right . there is more nme on the left also. she had port placed yesterday and is due to begin chemotherapy soon. her pet does not show metastatic disease. this is tn [facility] per path report that has focal metaplastic differentiation. she has started on primary chemotherapy. she also had mri with a 2.4x1.9 cm area of nme on the left. this was biopsied and is er/pr pos dcis. the tumor has not changed clinically and actually looks a little bigger on [place]. I discussed with Dr Janese Banks and we decided she should likely proceed to surgery at this point. she is due for next cycle 10/26   Past Surgical History Rolm Bookbinder, MD; 09/18/2019 9:02 AM) Appendectomy  Breast Biopsy  Right. Foot Surgery  Right. Sentinel Lymph Node Biopsy   Allergies Rolm Bookbinder, MD; 09/18/2019 9:02 AM) Sulfa Drugs  Penicillins  Amitiza *GASTROINTESTINAL AGENTS - MISC.*  Bentyl *ULCER DRUGS/ANTISPASMODICS/ANTICHOLINERGICS*  Celecoxib *ANALGESICS - ANTI-INFLAMMATORY*  Cortizone-10 Diabetics Skin *DERMATOLOGICALS*  Doxycycline *DERMATOLOGICALS*  Mobic *ANALGESICS - ANTI-INFLAMMATORY*  Nitro *ANTIANGINAL AGENTS*  Zoloft *ANTIDEPRESSANTS*   Medication History Rolm Bookbinder, MD; 09/18/2019 9:02 AM) Medications Reconciled clonazePAM (0.5MG  Tablet, Oral) Active. ALPRAZolam (0.5MG  Tablet, Oral)  Active. Gabapentin (100MG  Capsule, Oral) Active. DULoxetine HCl (60MG  Capsule DR Part, Oral) Active. Atorvastatin Calcium (40MG  Tablet, Oral) Active. Ondansetron HCl (4MG  Tablet, Oral) Active. Trulance (3MG  Tablet, Oral) Active. valACYclovir HCl (500MG  Tablet, Oral) Active. Atorvastatin Calcium (20MG  Tablet, Oral) Active. Progesterone Micronized (100MG  Capsule, Oral) Active.  Social History Rolm Bookbinder, MD; 09/18/2019 9:02 AM) Alcohol use  Occasional alcohol use. Caffeine use  Carbonated beverages, Tea. No drug use  Tobacco use  Never smoker.  Family History Rolm Bookbinder, MD; 09/18/2019 9:03 AM) Alcohol Abuse  Mother. Breast Cancer  Mother. Colon Cancer  Father. Colon Polyps  Father. Heart Disease  Mother. Hypertension  Mother.  Vitals (Alisha Spillers CMA; 09/18/2019 8:27 AM) 09/18/2019 8:27 AM Weight: 150 lb Height: 66in Body Surface Area: 1.77 m Body Mass Index: 24.21 kg/m  Temp.: 1F(Oral)  Pulse: 84 (Regular)  BP: 100/60 (Sitting, Left Arm, Standard) Physical Exam Rolm Bookbinder MD; 09/18/2019 8:57 AM) General Mental Status-Alert. Orientation-Oriented X3. Breast Nipples-No Discharge. Note: 3 cm lateral right breast mass, mobile,no skin involvement left negative Lymphatic Head & Neck General Head & Neck Lymphatics: Bilateral - Description - Normal. Axillary General Axillary Region: Bilateral - Description - Normal. Note: no [place] adenopathy cv rrr Lungs clear  Assessment & Plan Rolm Bookbinder MD; 09/18/2019 9:02 AM) BREAST CANCER OF UPPER-OUTER QUADRANT OF RIGHT FEMALE BREAST (C50.411) Story: Right MRM I think with response to chemo, which is not unexpected given metaplastic tumor, and this is a little bigger it is reasonable to proceed with surgery now. option on this side is a right mrm due to size of mass and the number of nodes present.  we discussed surgery, drains, overnight stay, pain control, risk  of lymphedema, need for radiation eventually after completes chemotherapy as well. BREAST NEOPLASM, TIS (DCIS), LEFT (D05.12) Story: Left mastectomy I think we can omit sentinel node on this side even though small chance of having invasive disease. with er/pr pos disease this likely wouldnt change her prognosis given the tn metaplastic tumor on other side and risk of lymphedema on this side outweighs the benefit. I will touch base with Dr Janese Banks about this also. we discussed lumpectomy/radiotherapy vs mastectomy. she would be candidate for bct but I am concerned with imaging not picking up either of these cancers. we discussed this in detail today and have elected to proceed with left total mastectomy we discussed reconstruction as well. she does not want it and I think at this point it would not be good idea due to need for other therapies and likelihood it will delay them. she is due for treatment 10/26 and will plan to do this a couple weeks after

## 2019-10-09 NOTE — Interval H&P Note (Signed)
History and Physical Interval Note:  10/09/2019 1:00 PM  Debra Little  has presented today for surgery, with the diagnosis of BILATERAL BREAST CANCER.  The various methods of treatment have been discussed with the patient and family. After consideration of risks, benefits and other options for treatment, the patient has consented to  Procedure(s): RIGHT MASTECTOMY MODIFIED RADICAL AND LEFT TOTAL MASTECTOMY (Bilateral) as a surgical intervention.  The patient's history has been reviewed, patient examined, no change in status, stable for surgery.  I have reviewed the patient's chart and labs.  Questions were answered to the patient's satisfaction.     Rolm Bookbinder

## 2019-10-09 NOTE — Telephone Encounter (Signed)
I called pt and she says her nerves has got the best of her today. I told her that it is normal to be nervous because it is the unknown. But when she gets the surgery and in recovery then it will be better because it is done and it is the best thing to do for your cancer. She states that she is in the car and they are heading there today to get the surgery

## 2019-10-10 ENCOUNTER — Encounter (HOSPITAL_COMMUNITY): Payer: Self-pay | Admitting: General Surgery

## 2019-10-10 DIAGNOSIS — C50411 Malignant neoplasm of upper-outer quadrant of right female breast: Secondary | ICD-10-CM | POA: Diagnosis not present

## 2019-10-10 LAB — BASIC METABOLIC PANEL
Anion gap: 11 (ref 5–15)
BUN: 8 mg/dL (ref 8–23)
CO2: 26 mmol/L (ref 22–32)
Calcium: 8.3 mg/dL — ABNORMAL LOW (ref 8.9–10.3)
Chloride: 106 mmol/L (ref 98–111)
Creatinine, Ser: 0.77 mg/dL (ref 0.44–1.00)
GFR calc Af Amer: >60 mL/min (ref >60)
GFR calc non Af Amer: >60 mL/min (ref >60)
Glucose, Bld: 143 mg/dL — ABNORMAL HIGH (ref 70–99)
Potassium: 3.9 mmol/L (ref 3.5–5.1)
Sodium: 143 mmol/L (ref 135–145)

## 2019-10-10 LAB — CBC
HCT: 28.5 % — ABNORMAL LOW (ref 36.0–46.0)
Hemoglobin: 9.1 g/dL — ABNORMAL LOW (ref 12.0–15.0)
MCH: 29.9 pg (ref 26.0–34.0)
MCHC: 31.9 g/dL (ref 30.0–36.0)
MCV: 93.8 fL (ref 80.0–100.0)
Platelets: 267 10*3/uL (ref 150–400)
RBC: 3.04 MIL/uL — ABNORMAL LOW (ref 3.87–5.11)
RDW: 14.8 % (ref 11.5–15.5)
WBC: 16.2 10*3/uL — ABNORMAL HIGH (ref 4.0–10.5)
nRBC: 0 % (ref 0.0–0.2)

## 2019-10-10 MED ORDER — OXYCODONE HCL 5 MG PO TABS: 5.0000 mg | ORAL_TABLET | ORAL | 0 refills | Status: AC | PRN

## 2019-10-10 MED ORDER — METHOCARBAMOL 500 MG PO TABS: 500.0000 mg | ORAL_TABLET | Freq: Four times a day (QID) | ORAL | 1 refills | Status: DC | PRN

## 2019-10-10 NOTE — Progress Notes (Signed)
1 Day Post-Op   Subjective/Chief Complaint: Doing fine this am  Objective: Vital signs in last 24 hours: Temp:  [98 F (36.7 C)-99.1 F (37.3 C)] 98.5 F (36.9 C) (11/13 0545) Pulse Rate:  [71-87] 78 (11/13 0545) Resp:  [10-20] 15 (11/13 0545) BP: (87-166)/(50-70) 101/62 (11/13 0545) SpO2:  [96 %-100 %] 97 % (11/13 0545) Weight:  [69 kg] 69 kg (11/12 1105) Last BM Date: 10/09/19  Intake/Output from previous day: 11/12 0701 - 11/13 0700 In: 1040 [P.O.:240; I.V.:800] Out: 1340 [Urine:1100; Drains:140; Blood:100] Intake/Output this shift: No intake/output data recorded.  flaps all viable, no hematoma, drains serosang as expected  Lab Results:  Recent Labs    10/07/19 1145 10/10/19 0426  WBC 10.4 16.2*  HGB 10.7* 9.1*  HCT 33.1* 28.5*  PLT 216 267   BMET Recent Labs    10/07/19 1145 10/10/19 0426  NA 138 143  K 3.9 3.9  CL 103 106  CO2 30 26  GLUCOSE 105* 143*  BUN 13 8  CREATININE 0.76 0.77  CALCIUM 9.0 8.3*   PT/INR No results for input(s): LABPROT, INR in the last 72 hours. ABG No results for input(s): PHART, HCO3 in the last 72 hours.  Invalid input(s): PCO2, PO2  Studies/Results: No results found.  Anti-infectives: Anti-infectives (From admission, onward)   Start     Dose/Rate Route Frequency Ordered Stop   10/09/19 1109  ciprofloxacin (CIPRO) 400 MG/200ML IVPB    Note to Pharmacy: Tressia Miners Ch: cabinet override      10/09/19 1109 10/09/19 1315   10/09/19 1100  ciprofloxacin (CIPRO) IVPB 400 mg     400 mg 200 mL/hr over 60 Minutes Intravenous On call to O.R. 10/09/19 1057 10/09/19 1345      Assessment/Plan: POD 1 right mrm/left mastectomy -likely home later today  Rolm Bookbinder 10/10/2019

## 2019-10-10 NOTE — Discharge Summary (Signed)
Physician Discharge Summary  Patient ID: SHENEL GUTTERMAN MRN: [id number] DOB/AGE: 61/06/1956 61 y.o.  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 Doing well following am. Drains as expected, voiding tolerating diet and pain controlled  Consults: none   Treatments: right mrm, left mastectomy    Disposition: Discharge disposition: 01-Home or Self Care        Allergies as of 10/10/2019      Reactions   Amitiza [lubiprostone] Other (See Comments)   Overly effective at higher dose   Bentyl [dicyclomine Hcl]    Lack of effect   Celecoxib    REACTION: unspecified   Cortisone Other (See Comments)   flush   Mobic [meloxicam]    Palpitations.  But can tolerate aleve   Nitrofurantoin Monohyd Macro    Sulfonamide Derivatives Nausea Only   Zoloft [sertraline Hcl] Diarrhea, Nausea Only   Buspar [buspirone] Palpitations   Doxycycline Palpitations   chest pain   Penicillins Rash      Medication List    TAKE these medications   acetaminophen 650 MG CR tablet Commonly known as: TYLENOL Take 650 mg by mouth every 8 (eight) hours as needed for pain.   atorvastatin 40 MG tablet Commonly known as: LIPITOR Take 40 mg by mouth at bedtime.   azithromycin 250 MG tablet Commonly known as: ZITHROMAX Take 2 tabs on day one and 1 tablet daily days 2 - 5   cholecalciferol 25 MCG (1000 UT) tablet Commonly known as: VITAMIN D3 Take 1,000 Units by mouth daily.   clonazePAM 0.5 MG tablet Commonly known as: KLONOPIN Take 0.5 mg by mouth 2 (two) times daily.   dexamethasone 4 MG tablet Commonly known as: DECADRON Take 2 tablets by mouth daily starting the day after Carboplatin and Cytoxan x 3 days. Take with food.   DULoxetine 60 MG  capsule Commonly known as: CYMBALTA Take 60 mg by mouth daily.   gabapentin 100 MG capsule Commonly known as: NEURONTIN Take 1-2 capsules (100-200 mg total) by mouth 3 (three) times daily as needed.   HYDROcodone-acetaminophen 5-325 MG tablet Commonly known as: NORCO/VICODIN Take 1 tablet by mouth every 6 (six) hours as needed for moderate pain.   lidocaine-prilocaine cream Commonly known as: EMLA Apply to affected area once What changed:   how much to take  how to take this  when to take this  reasons to take this  additional instructions   loratadine 10 MG tablet Commonly known as: CLARITIN Take 1 tablet (10 mg total) by mouth daily.   magic mouthwash w/lidocaine Soln Take 5 mLs by mouth 4 (four) times daily. 80 ml viscous lidocaine 2%, 80 ml Mylanta, 80 ml Diphenhydramine 12.5 mg/5 ml Elixir, 80 ml Nystatin 100,000 Unit suspension, 80 ml Prednisolone 15 mg/61ml, 80 ml Distilled Water. Sig: Swish/Swallow 5-10 ml four times a day as needed. Dispense 480 ml. 3RFs   methocarbamol 500 MG tablet Commonly known as: ROBAXIN Take 1 tablet (500 mg total) by mouth every 6 (six) hours as needed for muscle spasms.   omeprazole 20 MG capsule Commonly known as: PRILOSEC Take 20 mg by mouth daily.   ondansetron 8 MG tablet Commonly known as: Zofran Take 1 tablet (8 mg total) by mouth 2 (two) times daily as needed. Start on the third  day after chemotherapy.   oxyCODONE 5 MG immediate release tablet Commonly known as: Oxy IR/ROXICODONE Take 1 tablet (5 mg total) by mouth every 4 (four) hours as needed for up to 5 days for moderate pain.   prochlorperazine 10 MG tablet Commonly known as: COMPAZINE Take 1 tablet (10 mg total) by mouth every 6 (six) hours as needed (Nausea or vomiting).   sennosides-docusate sodium 8.6-50 MG tablet Commonly known as: SENOKOT-S Take 1-2 tablets by mouth at bedtime.   Trulance 3 MG Tabs Generic drug: Plecanatide Take 3 mg by mouth every other  day.   valACYclovir 500 MG tablet Commonly known as: VALTREX Take 500 mg by mouth daily.   vitamin B-12 500 MCG tablet Commonly known as: CYANOCOBALAMIN Take 500 mcg by mouth daily.   vitamin C 100 MG tablet Take 100 mg by mouth daily.      Follow-up Information    Rolm Bookbinder, MD Follow up in 1 week(s).   Specialty: General Surgery Contact information: [address] [place] [address] [phone]           Signed: Rolm Bookbinder 10/10/2019, 3:20 PM

## 2019-10-10 NOTE — Discharge Instructions (Signed)
[facility] [place] °[phone] ° °MASTECTOMY: POST OP INSTRUCTIONS °Take 400 mg of ibuprofen every 8 hours or 650 mg tylenol every 6 hours for next 72 hours then as needed. Use ice several times daily also. °Always review your discharge instruction sheet given to you by the facility where your surgery was performed. ° °IF YOU HAVE DISABILITY OR FAMILY LEAVE FORMS, YOU MUST BRING THEM TO THE OFFICE FOR PROCESSING.   °DO NOT GIVE THEM TO YOUR DOCTOR. °A prescription for pain medication may be given to you upon discharge.  Take your pain medication as prescribed, if needed.  If narcotic pain medicine is not needed, then you may take acetaminophen (Tylenol), naprosyn (Alleve) or ibuprofen (Advil) as needed. °1. Take your usually prescribed medications unless otherwise directed. °2. If you need a refill on your pain medication, please contact your pharmacy.  They will contact our office to request authorization.  Prescriptions will not be filled after 5pm or on week-ends. °3. You should follow a light diet the first few days after arrival home, such as soup and crackers, etc.  Resume your normal diet the day after surgery. °4. Most patients will experience some swelling and bruising on the chest and underarm.  Ice packs will help.  Swelling and bruising can take several days to resolve. Wear the binder day and night until you return to the office.  °5. It is common to experience some constipation if taking pain medication after surgery.  Increasing fluid intake and taking a stool softener (such as Colace) will usually help or prevent this problem from occurring.  A mild laxative (Milk of Magnesia or Miralax) should be taken according to package instructions if there are no bowel movements after 48 hours. °6. Unless discharge instructions indicate otherwise, leave your bandage dry and in place until your next appointment in 3-5 days.  You may take a limited sponge bath.  No tube baths or showers until the  drains are removed.  You may have steri-strips (small skin tapes) in place directly over the incision.  These strips should be left on the skin for 7-10 days. If you have glue it will come off in next couple week.  Any sutures will be removed at an office visit °7. DRAINS:  If you have drains in place, it is important to keep a list of the amount of drainage produced each day in your drains.  Before leaving the hospital, you should be instructed on drain care.  Call our office if you have any questions about your drains. I will remove your drains when they put out less than 30 cc or ml for 2 consecutive days. °8. ACTIVITIES:  You may resume regular (light) daily activities beginning the next day--such as daily self-care, walking, climbing stairs--gradually increasing activities as tolerated.  You may have sexual intercourse when it is comfortable.  Refrain from any heavy lifting or straining until approved by your doctor. °a. You may drive when you are no longer taking prescription pain medication, you can comfortably wear a seatbelt, and you can safely maneuver your car and apply brakes. °b. RETURN TO WORK:  __________________________________________________________ °9. You should see your doctor in the office for a follow-up appointment approximately 3-5 days after your surgery.  Your doctor’s nurse will typically make your follow-up appointment when she calls you with your pathology report.  Expect your pathology report 3-4business days after surgery. °10. OTHER INSTRUCTIONS: ______________________________________________________________________________________________ ____________________________________________________________________________________________ °WHEN TO CALL YOUR DR Tarah Buboltz: °1. Fever over 101.0 °  2. Nausea and/or vomiting °3. Extreme swelling or bruising °4. Continued bleeding from incision. °5. Increased pain, redness, or drainage from the incision. °The clinic staff is available to answer your  questions during regular business hours.  Please don’t hesitate to call and ask to speak to one of the nurses for clinical concerns.  If you have a medical emergency, go to the nearest emergency room or call 911.  A surgeon from [facility] is always on call at the hospital. °[address], [address], [place], [place]  [address] ? [address] [address], [place], [place]   [address] °[phone] ? [phone] ? FAX [phone] °Web site: [email or web address] ° °

## 2019-10-10 NOTE — Progress Notes (Signed)
Debra Little Span to be D/C'd  per MD order. Discussed with the patient and all questions fully answered.  VSS, Skin clean, dry and intact without evidence of skin break down, no evidence of skin tears noted.  IV catheter discontinued intact. Site without signs and symptoms of complications. Dressing and pressure applied.  An After Visit Summary was printed and given to the patient. Patient received prescription.  D/c education completed with patient/family including follow up instructions, medication list, d/c activities limitations if indicated, with other d/c instructions as indicated by MD - patient able to verbalize understanding, all questions fully answered.   Patient instructed to return to ED, call 911, or call MD for any changes in condition.   Patient to be escorted via [place], and D/C home via private auto.

## 2019-10-10 NOTE — Anesthesia Postprocedure Evaluation (Signed)
Anesthesia Post Note  Patient: Debra Little  Procedure(s) Performed: RIGHT MASTECTOMY MODIFIED RADICAL AND LEFT TOTAL MASTECTOMY (Bilateral Breast)     Patient location during evaluation: PACU Anesthesia Type: General Level of consciousness: awake and alert and oriented Pain management: pain level controlled Vital Signs Assessment: post-procedure vital signs reviewed and stable Respiratory status: spontaneous breathing, nonlabored ventilation and respiratory function stable Cardiovascular status: blood pressure returned to baseline Postop Assessment: no apparent nausea or vomiting Anesthetic complications: no                  Brennan Bailey

## 2019-10-16 ENCOUNTER — Telehealth: Payer: Self-pay | Admitting: Family Medicine

## 2019-10-16 DIAGNOSIS — F330 Major depressive disorder, recurrent, mild: Secondary | ICD-10-CM | POA: Diagnosis not present

## 2019-10-16 DIAGNOSIS — R69 Illness, unspecified: Secondary | ICD-10-CM | POA: Diagnosis not present

## 2019-10-16 DIAGNOSIS — F410 Panic disorder [episodic paroxysmal anxiety] without agoraphobia: Secondary | ICD-10-CM | POA: Diagnosis not present

## 2019-10-16 NOTE — Telephone Encounter (Signed)
Pt had bilateral mastectomy last week.  Pt c/o right ankle and foot swelling, worse than left.  No other swelling noticed.  Pt denies any redness to her lower extremities, not hot to the touch.  No fever, N/V Pt states that she has been up on her feet more than usual that last couple of days.  Pt reached out to [place] and was instructed to follow up with PCP for this concern.  Pt is scheduled for post op appt with Dr Donne Hazel tomorrow to remove drainage tubes.   Not Currently on any abx post op  Please advise, thanks.   Pt will be available around 3:00/3:15 - has a virtual visit with another office at 2:30pm

## 2019-10-16 NOTE — Telephone Encounter (Signed)
Patient advised.

## 2019-10-16 NOTE — Telephone Encounter (Signed)
If she has bilateral (not necessarily symmetric but bilateral) swelling in the ankles after she has been up on her feet more, then I would have her elevated her feet more and see if this resolves.   If she has unilateral/severe calf swelling with pain/redness, then we need to check her for DVT with an u/s.   If she has sudden CP or SOB, then to ER.   It sounds like foot elevation would be most appropriate at this point.  We can check her in the office as needed.  Thanks.

## 2019-10-17 ENCOUNTER — Other Ambulatory Visit (HOSPITAL_COMMUNITY): Payer: Self-pay | Admitting: General Surgery

## 2019-10-17 ENCOUNTER — Ambulatory Visit (HOSPITAL_COMMUNITY)
Admission: RE | Admit: 2019-10-17 | Discharge: 2019-10-17 | Disposition: A | Discharge status: Home / Self Care | Payer: Medicare HMO | Source: Ambulatory Visit | Attending: Family | Admitting: Family

## 2019-10-17 ENCOUNTER — Other Ambulatory Visit: Payer: Self-pay

## 2019-10-17 DIAGNOSIS — R600 Localized edema: Secondary | ICD-10-CM

## 2019-10-20 ENCOUNTER — Other Ambulatory Visit: Payer: Self-pay

## 2019-10-20 NOTE — Progress Notes (Signed)
Patient pre screened for office appointment, no questions or concerns today. Patient reminded of upcoming appointment time and date. 

## 2019-10-21 ENCOUNTER — Other Ambulatory Visit: Payer: Self-pay

## 2019-10-21 ENCOUNTER — Encounter: Payer: Self-pay | Admitting: Pharmacy Technician

## 2019-10-21 ENCOUNTER — Inpatient Hospital Stay: Payer: Medicare HMO

## 2019-10-21 ENCOUNTER — Inpatient Hospital Stay (HOSPITAL_BASED_OUTPATIENT_CLINIC_OR_DEPARTMENT_OTHER): Payer: Medicare HMO | Admitting: Oncology

## 2019-10-21 VITALS — BP 130/59 | HR 76 | Temp 97.8°F | Resp 16 | Wt 150.7 lb

## 2019-10-21 DIAGNOSIS — Z95828 Presence of other vascular implants and grafts: Secondary | ICD-10-CM

## 2019-10-21 DIAGNOSIS — Z171 Estrogen receptor negative status [ER-]: Secondary | ICD-10-CM | POA: Diagnosis not present

## 2019-10-21 DIAGNOSIS — E7800 Pure hypercholesterolemia, unspecified: Secondary | ICD-10-CM | POA: Diagnosis not present

## 2019-10-21 DIAGNOSIS — C50511 Malignant neoplasm of lower-outer quadrant of right female breast: Secondary | ICD-10-CM

## 2019-10-21 DIAGNOSIS — Z7189 Other specified counseling: Secondary | ICD-10-CM | POA: Diagnosis not present

## 2019-10-21 DIAGNOSIS — R5383 Other fatigue: Secondary | ICD-10-CM | POA: Diagnosis not present

## 2019-10-21 DIAGNOSIS — Z79899 Other long term (current) drug therapy: Secondary | ICD-10-CM | POA: Diagnosis not present

## 2019-10-21 DIAGNOSIS — R69 Illness, unspecified: Secondary | ICD-10-CM | POA: Diagnosis not present

## 2019-10-21 DIAGNOSIS — R5381 Other malaise: Secondary | ICD-10-CM | POA: Diagnosis not present

## 2019-10-21 DIAGNOSIS — M8580 Other specified disorders of bone density and structure, unspecified site: Secondary | ICD-10-CM | POA: Diagnosis not present

## 2019-10-21 DIAGNOSIS — K219 Gastro-esophageal reflux disease without esophagitis: Secondary | ICD-10-CM | POA: Diagnosis not present

## 2019-10-21 DIAGNOSIS — Z9013 Acquired absence of bilateral breasts and nipples: Secondary | ICD-10-CM | POA: Diagnosis not present

## 2019-10-21 LAB — COMPREHENSIVE METABOLIC PANEL
ALT: 23 U/L (ref 0–44)
AST: 19 U/L (ref 15–41)
Albumin: 3.4 g/dL — ABNORMAL LOW (ref 3.5–5.0)
Alkaline Phosphatase: 56 U/L (ref 38–126)
Anion gap: 5 (ref 5–15)
BUN: 15 mg/dL (ref 8–23)
CO2: 27 mmol/L (ref 22–32)
Calcium: 8.7 mg/dL — ABNORMAL LOW (ref 8.9–10.3)
Chloride: 105 mmol/L (ref 98–111)
Creatinine, Ser: 0.7 mg/dL (ref 0.44–1.00)
GFR calc Af Amer: >60 mL/min (ref >60)
GFR calc non Af Amer: >60 mL/min (ref >60)
Glucose, Bld: 111 mg/dL — ABNORMAL HIGH (ref 70–99)
Potassium: 3.6 mmol/L (ref 3.5–5.1)
Sodium: 137 mmol/L (ref 135–145)
Total Bilirubin: 0.5 mg/dL (ref 0.3–1.2)
Total Protein: 6.6 g/dL (ref 6.5–8.1)

## 2019-10-21 LAB — CBC WITH DIFFERENTIAL/PLATELET
Abs Immature Granulocytes: 0.06 10*3/uL (ref 0.00–0.07)
Basophils Absolute: 0.1 10*3/uL (ref 0.0–0.1)
Basophils Relative: 1 %
Eosinophils Absolute: 0.2 10*3/uL (ref 0.0–0.5)
Eosinophils Relative: 3 %
HCT: 30 % — ABNORMAL LOW (ref 36.0–46.0)
Hemoglobin: 9.6 g/dL — ABNORMAL LOW (ref 12.0–15.0)
Immature Granulocytes: 1 %
Lymphocytes Relative: 10 %
Lymphs Abs: 0.9 10*3/uL (ref 0.7–4.0)
MCH: 29.5 pg (ref 26.0–34.0)
MCHC: 32 g/dL (ref 30.0–36.0)
MCV: 92.3 fL (ref 80.0–100.0)
Monocytes Absolute: 1.1 10*3/uL — ABNORMAL HIGH (ref 0.1–1.0)
Monocytes Relative: 13 %
Neutro Abs: 6.4 10*3/uL (ref 1.7–7.7)
Neutrophils Relative %: 72 %
Platelets: 316 10*3/uL (ref 150–400)
RBC: 3.25 MIL/uL — ABNORMAL LOW (ref 3.87–5.11)
RDW: 13.7 % (ref 11.5–15.5)
WBC: 8.7 10*3/uL (ref 4.0–10.5)
nRBC: 0 % (ref 0.0–0.2)

## 2019-10-21 MED ORDER — SODIUM CHLORIDE 0.9% FLUSH
10.0000 mL | Freq: Once | INTRAVENOUS | Status: AC
  Administered 2019-10-21: 10 mL via INTRAVENOUS
  Filled 2019-10-21: qty 10

## 2019-10-21 MED ORDER — HEPARIN SOD (PORK) LOCK FLUSH 100 UNIT/ML IV SOLN
500.0000 [IU] | Freq: Once | INTRAVENOUS | Status: AC
  Administered 2019-10-21: 500 [IU] via INTRAVENOUS

## 2019-10-21 NOTE — Progress Notes (Signed)
Patient no longer getting Keytruda from [employer]. based on regimen change. Last DOS covered is 09/22/19.

## 2019-10-22 LAB — SURGICAL PATHOLOGY

## 2019-10-27 ENCOUNTER — Encounter: Payer: Self-pay | Admitting: Oncology
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
## 2019-10-28 ENCOUNTER — Telehealth: Payer: Self-pay | Admitting: *Deleted

## 2019-10-28 NOTE — Telephone Encounter (Signed)
Per Dr. Janese Banks request to send tumor off for PDL1 testing.  The specimen is at [facility]. Called and asked for fax # and faxed the request with copy of pathology to add testing on.

## 2019-10-30 ENCOUNTER — Other Ambulatory Visit: Payer: Self-pay

## 2019-11-05 ENCOUNTER — Telehealth: Payer: Self-pay | Admitting: *Deleted

## 2019-11-05 NOTE — Telephone Encounter (Signed)
Schedule message sent. 

## 2019-11-05 NOTE — Telephone Encounter (Signed)
Patient called reporting that she has been being treated for an infection by her surgeon. She is scheduled for chemotherapy on the 15th and she is asking if she needs to keep that appointment or does it need to be changed. She said her surgeon was supposed to call you. Please advise

## 2019-11-05 NOTE — Telephone Encounter (Signed)
Postpone chemo to 12/28 or 12/29

## 2019-11-10 DIAGNOSIS — R0602 Shortness of breath: Secondary | ICD-10-CM | POA: Diagnosis not present

## 2019-11-10 DIAGNOSIS — K219 Gastro-esophageal reflux disease without esophagitis: Secondary | ICD-10-CM | POA: Diagnosis not present

## 2019-11-10 DIAGNOSIS — I341 Nonrheumatic mitral (valve) prolapse: Secondary | ICD-10-CM | POA: Diagnosis not present

## 2019-11-11 ENCOUNTER — Ambulatory Visit: Payer: Medicare HMO

## 2019-11-11 ENCOUNTER — Ambulatory Visit: Payer: Medicare HMO | Admitting: Oncology

## 2019-11-11 ENCOUNTER — Other Ambulatory Visit: Payer: Medicare HMO

## 2019-11-12 ENCOUNTER — Inpatient Hospital Stay: Payer: Medicare HMO | Attending: Oncology | Admitting: Occupational Therapy

## 2019-11-12 ENCOUNTER — Ambulatory Visit: Payer: Medicare HMO

## 2019-11-12 ENCOUNTER — Other Ambulatory Visit: Payer: Self-pay

## 2019-11-12 DIAGNOSIS — L905 Scar conditions and fibrosis of skin: Secondary | ICD-10-CM

## 2019-11-12 DIAGNOSIS — M25612 Stiffness of left shoulder, not elsewhere classified: Secondary | ICD-10-CM

## 2019-11-12 DIAGNOSIS — M25611 Stiffness of right shoulder, not elsewhere classified: Secondary | ICD-10-CM

## 2019-11-12 NOTE — Therapy (Signed)
[facility] [address] [place], [place], [place], [address] Phone: [phone]   Fax:  [phone]  Occupational Therapy Screen  Patient Details  Name: Debra Little MRN: [id number] Date of Birth: Mar 13, 1956 No data recorded  Encounter Date: 11/12/2019  OT End of Session - 11/12/19 1126    Visit Number  0       Past Medical History:  Diagnosis Date  . Anemia    d/t chemo - last Hgb 10.8  . Anxiety    sees Dr. [name]  . Breast cancer ([place]) 2020  . Cyst of breast    per [name]  . Dysrhythmia    Hx of palpitations  . Family history of breast cancer   . Family history of colon cancer   . Family history of leukemia   . Family history of ovarian cancer   . Fibromyalgia   . GERD (gastroesophageal reflux disease)   . Heart murmur   . Hemorrhoids   . Herpes, genital 04/2015   confirmed with HSV 2 IgG  . Hiatal hernia   . Hypercholesterolemia    Dr. Kyra Searles  . Hyperlipidemia   . IBS (irritable bowel syndrome)    constipation predominant  . IC (interstitial cystitis)    per [facility]  . Mild depression ([place])   . MVP (mitral valve prolapse)    [facility] cards eval 2012  . Osteopenia    2010/2017, DEXA at [facility];spine and fem neck  . PONV (postoperative nausea and vomiting)   . Vitamin D deficiency    history of    Past Surgical History:  Procedure Laterality Date  . ABDOMINAL HYSTERECTOMY    . APPENDECTOMY    . BLADDER SURGERY     1980's  . BREAST EXCISIONAL BIOPSY Right    benign  . BREAST EXCISIONAL BIOPSY Bilateral    benign  . COLONOSCOPY  09/2014   Dr. [name]  . COLONOSCOPY  2009   at [facility] 1 POLYP (BENIGN)  . excision of breast cysts     hx of multiple cyst aspirations  . EXPLORATORY LAPAROTOMY    . MASTECTOMY MODIFIED RADICAL Bilateral 10/09/2019   Procedure: RIGHT MASTECTOMY MODIFIED RADICAL AND LEFT TOTAL MASTECTOMY;  Surgeon: Rolm Bookbinder, MD;  Location: [facility];  Service:  General;  Laterality: Bilateral;  . PORTA CATH INSERTION N/A 08/06/2019   Procedure: PORTA CATH INSERTION;  Surgeon: Katha Cabal, MD;  Location: [facility];  Service: Cardiovascular;  Laterality: N/A;  . PORTACATH PLACEMENT Left 08/05/2019   Procedure: INSERTION PORT-A-CATH, Attempted;  Surgeon: Jules Husbands, MD;  Location: [facility];  Service: General;  Laterality: Left;  . TUBAL LIGATION      There were no vitals filed for this visit.  Subjective Assessment - 11/12/19 1124    Subjective   I had bilateral mastectomy about 5 wks ago and then got infection - but I finished by antibiotics about week ago - it still drains here on my R chest - and my arms little stiff , Dr Burney Gauze wanted me to see therapy but I don't think I need a lot    Currently in Pain?  No/denies        [facility] OT Assessment - 11/12/19 0001      AROM   Overall AROM Comments  Ext rotation limited - but can touch back of head and back     Right Shoulder Flexion  138 Degrees    Right  Shoulder ABduction  140 Degrees    Left Shoulder Flexion  150 Degrees    Left Shoulder ABduction  140 Degrees       LYMPHEDEMA/ONCOLOGY QUESTIONNAIRE - 11/12/19 1128      Right Upper Extremity Lymphedema   15 cm Proximal to Olecranon Process  27.5 cm    10 cm Proximal to Olecranon Process  25.5 cm    Olecranon Process  23 cm    15 cm Proximal to Ulnar Styloid Process  21.5 cm    10 cm Proximal to Ulnar Styloid Process  18.6 cm    Just Proximal to Ulnar Styloid Process  14.8 cm    Across Hand at [employer]  18 cm    At [place] of 2nd Digit  6 cm    At [facility] of Thumb  5.5 cm      Left Upper Extremity Lymphedema   15 cm Proximal to Olecranon Process  27.5 cm    10 cm Proximal to Olecranon Process  25 cm    Olecranon Process  22 cm    15 cm Proximal to Ulnar Styloid Process  20.5 cm    10 cm Proximal to Ulnar Styloid Process  18.5 cm    Just Proximal to Ulnar Styloid Process  14.5 cm    Across Hand at [employer]  16.5 cm    At [place] of 2nd Digit  6 cm    At [facility] of Thumb  5.5 cm     Pt refer by Dr Janese Banks for OT to screen for education on lymphedema and ROM  Post mastectomy.  Note from Dr Janese Banks 10/21/2019:   Patient is a 63 y.o. female withclinical prognostic stage IIIb cT2 cN1 M0 triple negative invasive mammary carcinoma of the right breast.  She is s/p 3 cycles of neoadjuvant AC Keytruda chemotherapy followed by bilateral mastectomy and targeted node dissection due to significant response to neoadjuvant chemotherapy.  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  The metaplastic pathology particularly make this aggressive.  I would like to complete her adjuvant chemotherapy at this time and give her 1 cycle of AC chemotherapy followed by 12 cycles of weekly Taxol.  I will check PD-L1 status on her final pathology.  If she has significant PD-L1 expression it would make sense to continue Keytruda which she has been approved for compassionate use through the drug company.  However if she does not have any significant PD-L1 expression I will plan to drop her Keytruda.  Treatment will be given with a curative intent  Patient is still healing from her mastectomy wound and I will give her about 3 weeks to see if she has recovered and ready to start chemo  Upon completion of chemotherapy I will refer her to radiation oncology to discuss adjuvant radiation treatment  OT note today :  Pt complain about pain this am at hernia - she took some pain meds and than helped - she will call her GI MD. Pt is about 5 wks out from bilateral mastectomy with 9 axillary ln removed on R per pt. Scar healing very well on bilateral chest - but R lateral part still draining per pt after having infection and has bandage  on- she do have a velcro tube  compression garment on - Done with her antibiotic for about week per pt and next appt with Dr Donne Hazel is 30th Dec  Pt show decrease Shoulder  AROM over head and external rotation - pt provide with some HEP for those to do and keep pain under 2/10  Pt ed on lymphedema and hand out provided - circumference WNL - pt is R hand dominant Will check on pt again in month   Thank you for referral                             Patient will benefit from skilled therapeutic intervention in order to improve the following deficits and impairments:           Visit Diagnosis: Stiffness of right shoulder, not elsewhere classified  Stiffness of left shoulder, not elsewhere classified  Scar condition and fibrosis of skin    Problem List Patient Active Problem List   Diagnosis Date Noted  . Bilateral breast cancer ([place]) 10/09/2019  . Genetic testing 08/27/2019  . Family history of breast cancer   . Family history of ovarian cancer   . Family history of colon cancer   . Family history of leukemia   . Goals of care, counseling/discussion 08/07/2019  . Breast cancer ([place]) 07/30/2019  . GERD (gastroesophageal reflux disease) 06/30/2019  . Abdominal discomfort 05/15/2019  . Advance care planning 07/14/2018  . Health care maintenance 07/14/2018  . Depression, recurrent ([place]) 07/14/2018  . Constipation 07/14/2018  . Altered taste 07/14/2018  . Fibrocystic breast changes of both breasts 01/24/2018  . Vasomotor symptoms due to menopause 09/05/2017  . Herpes simplex vulvovaginitis 09/05/2017  . Skin nodule 05/25/2017  . Encounter for screening examination for infectious disease 03/18/2017  . Lipoma 03/18/2017  . Vaginitis 03/18/2017  . Radicular pain in left arm 07/27/2015  . Neuralgia and neuritis, unspecified 07/27/2015  . Fatigue 03/05/2015  . B12 deficiency 03/05/2015  . Skin rash 05/01/2014  . MVP (mitral valve prolapse) 04/30/2014   . Cyst of breast 04/05/2014  . Interstitial cystitis 03/31/2014  . Routine general medical examination at a health care facility 11/13/2011  . Anxiety and depression 10/06/2010  . BREAST CYSTS, BILATERAL 12/03/2008  . PALPITATIONS, CHRONIC 12/03/2008  . UNSPECIFIED VITAMIN D DEFICIENCY 09/10/2008  . HLD (hyperlipidemia) 09/10/2008  . IRRITABLE BOWEL SYNDROME 02/12/2008    Rosalyn Gess OTR/L,CLT 11/12/2019, 11:31 AM  [facility] [address] [place], [place], [place], [address] Phone: [phone]   Fax:  [phone]  Name: AKISHA STURGILL MRN: [id number] Date of Birth: 02-Jul-1956

## 2019-11-14 ENCOUNTER — Telehealth: Payer: Self-pay | Admitting: *Deleted

## 2019-11-14 NOTE — Telephone Encounter (Signed)
Called patient and had to leave her a voicemail to call back.

## 2019-11-14 NOTE — Telephone Encounter (Signed)
Patient called me back and I asked her what was going on. She stated that a week ago she developed a mid abdominal pain radiating to her mid back. She thought that it would go away on its own but it did not. So, after a day with pain, she took a pain medication and it eased the pain. However, she then got in contact with her GI physician and they gave her medication for it. I asked patient if she was having pain today and she stated that she was feeling pretty good since she is taking the medication her GI physician had given her. I told patient that if her pain came back over the weekend, to go to the ED. If the pain came back on Sunday, to please call us on Monday morning so she could see one of our nurse practitioners at our [facility] since Dr. Janese Banks will be out of the office for the week. Patient stated that she would give [place] a call on Monday in case her abdominal pain would come back. Patient had no further questions.

## 2019-11-14 NOTE — Telephone Encounter (Signed)
Patient called reporting that she is having upper mid abdominal pain radiating through to her back tome 1 week States like her Hiatal hernia and is asking if we can do anything about it. Please advise

## 2019-11-19 DIAGNOSIS — R0602 Shortness of breath: Secondary | ICD-10-CM | POA: Diagnosis not present

## 2019-11-24 ENCOUNTER — Other Ambulatory Visit: Payer: Self-pay

## 2019-11-24 ENCOUNTER — Telehealth: Payer: Self-pay | Admitting: *Deleted

## 2019-11-24 NOTE — Telephone Encounter (Signed)
I got in touch with [name]. She is not yet healed up. Please move her chemo and appt with me out by 1 week.

## 2019-11-24 NOTE — Telephone Encounter (Signed)
Patient called reporting that she is scheduled for chemotherapy tomorrow , but she des not see her surgeon who told her not to take chemotherapy until he sees her again which is not until Wednesday. She is asking if her appointment needs to be rescheduled. Please advise

## 2019-11-24 NOTE — Progress Notes (Unsigned)
Patient pre screened for office appointment, no questions or concerns today. Patient reminded of upcoming appointment time and date. 

## 2019-11-24 NOTE — Telephone Encounter (Signed)
Schedule message sent. 

## 2019-11-25 ENCOUNTER — Inpatient Hospital Stay: Payer: Medicare HMO

## 2019-11-25 ENCOUNTER — Inpatient Hospital Stay: Payer: Medicare HMO | Admitting: Oncology

## 2019-11-26 ENCOUNTER — Inpatient Hospital Stay: Payer: Medicare HMO

## 2019-12-01 ENCOUNTER — Other Ambulatory Visit: Payer: Self-pay

## 2019-12-01 NOTE — Progress Notes (Signed)
Patient pre screened for office appointment, no questions or concerns today. Patient reminded of upcoming appointment time and date. 

## 2019-12-02 ENCOUNTER — Inpatient Hospital Stay (HOSPITAL_BASED_OUTPATIENT_CLINIC_OR_DEPARTMENT_OTHER): Payer: PPO | Admitting: Oncology

## 2019-12-02 ENCOUNTER — Other Ambulatory Visit: Payer: Self-pay

## 2019-12-02 ENCOUNTER — Inpatient Hospital Stay: Payer: PPO

## 2019-12-02 ENCOUNTER — Inpatient Hospital Stay: Payer: PPO | Attending: Oncology

## 2019-12-02 VITALS — BP 156/75 | HR 79 | Temp 97.8°F | Wt 146.3 lb

## 2019-12-02 DIAGNOSIS — Z79899 Other long term (current) drug therapy: Secondary | ICD-10-CM | POA: Insufficient documentation

## 2019-12-02 DIAGNOSIS — R5383 Other fatigue: Secondary | ICD-10-CM | POA: Diagnosis not present

## 2019-12-02 DIAGNOSIS — Z006 Encounter for examination for normal comparison and control in clinical research program: Secondary | ICD-10-CM | POA: Insufficient documentation

## 2019-12-02 DIAGNOSIS — T451X5A Adverse effect of antineoplastic and immunosuppressive drugs, initial encounter: Secondary | ICD-10-CM | POA: Diagnosis not present

## 2019-12-02 DIAGNOSIS — Z800 Family history of malignant neoplasm of digestive organs: Secondary | ICD-10-CM | POA: Insufficient documentation

## 2019-12-02 DIAGNOSIS — E785 Hyperlipidemia, unspecified: Secondary | ICD-10-CM | POA: Insufficient documentation

## 2019-12-02 DIAGNOSIS — Z7952 Long term (current) use of systemic steroids: Secondary | ICD-10-CM | POA: Insufficient documentation

## 2019-12-02 DIAGNOSIS — R5381 Other malaise: Secondary | ICD-10-CM | POA: Diagnosis not present

## 2019-12-02 DIAGNOSIS — Z7689 Persons encountering health services in other specified circumstances: Secondary | ICD-10-CM | POA: Diagnosis not present

## 2019-12-02 DIAGNOSIS — C50511 Malignant neoplasm of lower-outer quadrant of right female breast: Secondary | ICD-10-CM

## 2019-12-02 DIAGNOSIS — Z5111 Encounter for antineoplastic chemotherapy: Secondary | ICD-10-CM | POA: Diagnosis not present

## 2019-12-02 DIAGNOSIS — F419 Anxiety disorder, unspecified: Secondary | ICD-10-CM | POA: Diagnosis not present

## 2019-12-02 DIAGNOSIS — Z95828 Presence of other vascular implants and grafts: Secondary | ICD-10-CM

## 2019-12-02 DIAGNOSIS — D6481 Anemia due to antineoplastic chemotherapy: Secondary | ICD-10-CM

## 2019-12-02 DIAGNOSIS — Z803 Family history of malignant neoplasm of breast: Secondary | ICD-10-CM | POA: Diagnosis not present

## 2019-12-02 DIAGNOSIS — Z171 Estrogen receptor negative status [ER-]: Secondary | ICD-10-CM | POA: Insufficient documentation

## 2019-12-02 DIAGNOSIS — Z8041 Family history of malignant neoplasm of ovary: Secondary | ICD-10-CM | POA: Insufficient documentation

## 2019-12-02 LAB — CBC WITH DIFFERENTIAL/PLATELET
Abs Immature Granulocytes: 0.02 10*3/uL (ref 0.00–0.07)
Basophils Absolute: 0 10*3/uL (ref 0.0–0.1)
Basophils Relative: 1 %
Eosinophils Absolute: 0.2 10*3/uL (ref 0.0–0.5)
Eosinophils Relative: 3 %
HCT: 34 % — ABNORMAL LOW (ref 36.0–46.0)
Hemoglobin: 10.6 g/dL — ABNORMAL LOW (ref 12.0–15.0)
Immature Granulocytes: 0 %
Lymphocytes Relative: 12 %
Lymphs Abs: 0.9 10*3/uL (ref 0.7–4.0)
MCH: 27.7 pg (ref 26.0–34.0)
MCHC: 31.2 g/dL (ref 30.0–36.0)
MCV: 89 fL (ref 80.0–100.0)
Monocytes Absolute: 0.7 10*3/uL (ref 0.1–1.0)
Monocytes Relative: 10 %
Neutro Abs: 5.6 10*3/uL (ref 1.7–7.7)
Neutrophils Relative %: 74 %
Platelets: 219 10*3/uL (ref 150–400)
RBC: 3.82 MIL/uL — ABNORMAL LOW (ref 3.87–5.11)
RDW: 12.1 % (ref 11.5–15.5)
WBC: 7.5 10*3/uL (ref 4.0–10.5)
nRBC: 0 % (ref 0.0–0.2)

## 2019-12-02 LAB — COMPREHENSIVE METABOLIC PANEL
ALT: 19 U/L (ref 0–44)
AST: 20 U/L (ref 15–41)
Albumin: 3.6 g/dL (ref 3.5–5.0)
Alkaline Phosphatase: 70 U/L (ref 38–126)
Anion gap: 6 (ref 5–15)
BUN: 13 mg/dL (ref 8–23)
CO2: 27 mmol/L (ref 22–32)
Calcium: 8.9 mg/dL (ref 8.9–10.3)
Chloride: 105 mmol/L (ref 98–111)
Creatinine, Ser: 0.75 mg/dL (ref 0.44–1.00)
GFR calc Af Amer: >60 mL/min (ref >60)
GFR calc non Af Amer: >60 mL/min (ref >60)
Glucose, Bld: 117 mg/dL — ABNORMAL HIGH (ref 70–99)
Potassium: 3.7 mmol/L (ref 3.5–5.1)
Sodium: 138 mmol/L (ref 135–145)
Total Bilirubin: 0.4 mg/dL (ref 0.3–1.2)
Total Protein: 6.7 g/dL (ref 6.5–8.1)

## 2019-12-02 MED ORDER — DOXORUBICIN HCL CHEMO IV INJECTION 2 MG/ML
57.0000 mg/m2 | Freq: Once | INTRAVENOUS | Status: AC
  Administered 2019-12-02: 100 mg via INTRAVENOUS
  Filled 2019-12-02: qty 50

## 2019-12-02 MED ORDER — PALONOSETRON HCL INJECTION 0.25 MG/5ML
0.2500 mg | Freq: Once | INTRAVENOUS | Status: AC
  Administered 2019-12-02: 0.25 mg via INTRAVENOUS
  Filled 2019-12-02: qty 5

## 2019-12-02 MED ORDER — SODIUM CHLORIDE 0.9 % IV SOLN
571.0000 mg/m2 | Freq: Once | INTRAVENOUS | Status: AC
  Administered 2019-12-02: 1000 mg via INTRAVENOUS
  Filled 2019-12-02: qty 50

## 2019-12-02 MED ORDER — SODIUM CHLORIDE 0.9% FLUSH
10.0000 mL | Freq: Once | INTRAVENOUS | Status: AC
  Administered 2019-12-02: 11:00:00 10 mL via INTRAVENOUS
  Filled 2019-12-02: qty 10

## 2019-12-02 MED ORDER — SODIUM CHLORIDE 0.9 % IV SOLN
Freq: Once | INTRAVENOUS | Status: AC
  Filled 2019-12-02: qty 250

## 2019-12-02 MED ORDER — HEPARIN SOD (PORK) LOCK FLUSH 100 UNIT/ML IV SOLN
500.0000 [IU] | Freq: Once | INTRAVENOUS | Status: AC | PRN
  Administered 2019-12-02: 500 [IU]
  Filled 2019-12-02: qty 5

## 2019-12-02 MED ORDER — SODIUM CHLORIDE 0.9 % IV SOLN
Freq: Once | INTRAVENOUS | Status: AC
  Filled 2019-12-02: qty 5

## 2019-12-03 ENCOUNTER — Inpatient Hospital Stay: Payer: PPO

## 2019-12-03 ENCOUNTER — Other Ambulatory Visit: Payer: Self-pay

## 2019-12-03 ENCOUNTER — Ambulatory Visit: Payer: Medicare HMO

## 2019-12-03 ENCOUNTER — Inpatient Hospital Stay: Payer: PPO | Admitting: Occupational Therapy

## 2019-12-03 DIAGNOSIS — C50511 Malignant neoplasm of lower-outer quadrant of right female breast: Secondary | ICD-10-CM

## 2019-12-03 DIAGNOSIS — L905 Scar conditions and fibrosis of skin: Secondary | ICD-10-CM

## 2019-12-03 DIAGNOSIS — M25612 Stiffness of left shoulder, not elsewhere classified: Secondary | ICD-10-CM

## 2019-12-03 DIAGNOSIS — Z171 Estrogen receptor negative status [ER-]: Secondary | ICD-10-CM

## 2019-12-03 DIAGNOSIS — Z5111 Encounter for antineoplastic chemotherapy: Secondary | ICD-10-CM | POA: Diagnosis not present

## 2019-12-03 DIAGNOSIS — M25611 Stiffness of right shoulder, not elsewhere classified: Secondary | ICD-10-CM

## 2019-12-03 LAB — CANCER ANTIGEN 15-3: CA 15-3: 9.3 U/mL (ref 0.0–25.0)

## 2019-12-03 LAB — CANCER ANTIGEN 27.29: CA 27.29: 9.8 U/mL (ref 0.0–38.6)

## 2019-12-03 MED ORDER — PEGFILGRASTIM-CBQV 6 MG/0.6ML ~~LOC~~ SOSY
6.0000 mg | PREFILLED_SYRINGE | Freq: Once | SUBCUTANEOUS | Status: AC
  Administered 2019-12-03: 6 mg via SUBCUTANEOUS
  Filled 2019-12-03: qty 0.6

## 2019-12-03 NOTE — Therapy (Signed)
[facility] [address] [place], [place], [place], [address] Phone: [phone]   Fax:  [phone]  Occupational Therapy Screen  Patient Details  Name: Debra Little MRN: [id number] Date of Birth: 1956-01-20 No data recorded  Encounter Date: 12/03/2019  OT End of Session - 12/03/19 1227    Visit Number  0       Past Medical History:  Diagnosis Date  . Anemia    d/t chemo - last Hgb 10.8  . Anxiety    sees Dr. [name]  . Breast cancer ([place]) 2020  . Cyst of breast    per [name]  . Dysrhythmia    Hx of palpitations  . Family history of breast cancer   . Family history of colon cancer   . Family history of leukemia   . Family history of ovarian cancer   . Fibromyalgia   . GERD (gastroesophageal reflux disease)   . Heart murmur   . Hemorrhoids   . Herpes, genital 04/2015   confirmed with HSV 2 IgG  . Hiatal hernia   . Hypercholesterolemia    Dr. Kyra Searles  . Hyperlipidemia   . IBS (irritable bowel syndrome)    constipation predominant  . IC (interstitial cystitis)    per [facility]  . Mild depression ([place])   . MVP (mitral valve prolapse)    [facility] cards eval 2012  . Osteopenia    2010/2017, DEXA at [facility];spine and fem neck  . PONV (postoperative nausea and vomiting)   . Vitamin D deficiency    history of    Past Surgical History:  Procedure Laterality Date  . ABDOMINAL HYSTERECTOMY    . APPENDECTOMY    . BLADDER SURGERY     1980's  . BREAST EXCISIONAL BIOPSY Right    benign  . BREAST EXCISIONAL BIOPSY Bilateral    benign  . COLONOSCOPY  09/2014   Dr. [name]  . COLONOSCOPY  2009   at [facility] 1 POLYP (BENIGN)  . excision of breast cysts     hx of multiple cyst aspirations  . EXPLORATORY LAPAROTOMY    . MASTECTOMY MODIFIED RADICAL Bilateral 10/09/2019   Procedure: RIGHT MASTECTOMY MODIFIED RADICAL AND LEFT TOTAL MASTECTOMY;  Surgeon: Rolm Bookbinder, MD;  Location: [facility];  Service:  General;  Laterality: Bilateral;  . PORTA CATH INSERTION N/A 08/06/2019   Procedure: PORTA CATH INSERTION;  Surgeon: Katha Cabal, MD;  Location: [facility];  Service: Cardiovascular;  Laterality: N/A;  . PORTACATH PLACEMENT Left 08/05/2019   Procedure: INSERTION PORT-A-CATH, Attempted;  Surgeon: Jules Husbands, MD;  Location: [facility];  Service: General;  Laterality: Left;  . TUBAL LIGATION      There were no vitals filed for this visit.  Subjective Assessment - 12/03/19 1225    Subjective   I moved to another place this past weekend and did not had to much help so had to do lot of it myself - did probably more lifting that I should have - did not do my exercises as much - surgeon released me    Currently in Pain?  No/denies          LYMPHEDEMA/ONCOLOGY QUESTIONNAIRE - 12/03/19 1130      Right Upper Extremity Lymphedema   15 cm Proximal to Olecranon Process  28.5 cm    10 cm Proximal to Olecranon Process  26 cm    Olecranon Process  22.8 cm    15 cm  Proximal to Ulnar Styloid Process  21.5 cm    10 cm Proximal to Ulnar Styloid Process  18.6 cm    Just Proximal to Ulnar Styloid Process  14.6 cm    Across Hand at Thumb [employer]  17 cm    At [place] of 2nd Digit  5.8 cm    At Base of Thumb  5.5 cm      Left Upper Extremity Lymphedema   10 cm Proximal to Olecranon Process  25.3 cm    Olecranon Process  22 cm    15 cm Proximal to Ulnar Styloid Process  21 cm    10 cm Proximal to Ulnar Styloid Process  18.5 cm    Just Proximal to Ulnar Styloid Process  14.3 cm    Across Hand at Thumb [employer]  17 cm        NOTE FROM 11/11/2019  Pt refer by Dr Rao for OT to screen for education on lymphedema and ROM  Post mastectomy. 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 The metaplastic pathology particularly make this aggressive. I would like to complete her adjuvant chemotherapy at this time and give her 1 cycle of AC chemotherapy followed by 12 cycles of weekly Taxol.  I will check PD-L1 status on her final pathology. If she has significant PD-L1 expression it would make sense to continue Keytruda which she has been approved for compassionate use through the drug company. However if she does not have any significant PD-L1 expression I will plan to drop her Keytruda.Treatment will be given with a curative intent  Patient is still healing from her mastectomy wound and I will give her about 3 weeks to see if she has recovered and ready to start chemo  Upon completion of chemotherapy I will refer her to radiation oncology to discuss adjuvant radiation treatment  OT note 11/11/2019 :  Pt complain about pain this am at hernia - she took some pain meds and than helped - she will call her GI MD. Pt is about 5 wks out from bilateral mastectomy with 9 axillary ln removed on R per pt. Scar healing very well on bilateral chest - but R lateral part still draining per pt after having infection and has bandage on- she do have a velcro tube compression garment on - Done with her antibiotic for about week per pt and next appt with Dr [place] is 30th Dec  Pt show decrease Shoulder  AROM over head and external rotation - pt provide with some HEP for those to do and keep pain under 2/10  Pt ed on lymphedema and hand out provided - circumference WNL - pt is R hand dominant Will check on pt again in month   OT NOTE  12/03/2019:  Pt arrive with incision healed and starting chemo yesterday back again.  Incision healed but scar adhesions worse on R chest and axilla limiting her shoulder over head and external rotation - pt to do scar mobs in arm   over head position - and  Cont with AAROM on wall for shoulder flexion , external  Scapula squeezes few times during day  Her L upper arm circumference to elbow increase compare to 3 wks ago and recommend for pt to get over the counter compression sleeve and glove - she moved this past weekend and was picking upr things per pt she should have not - but did not had a lot of help  Also would recommend unilateral post mastectomy jovipak breast pad to use during day - pt report some swelling under scars - but could be too because of scars adhesions Want to be preventative prior to radiation in future too   Pt to call me when she get compression garments                                 Visit Diagnosis: Stiffness of right shoulder, not elsewhere classified  Stiffness of left shoulder, not elsewhere classified  Scar condition and fibrosis of skin    Problem List Patient Active Problem List   Diagnosis Date Noted  . Bilateral breast cancer ([place]) 10/09/2019  . Genetic testing 08/27/2019  . Family history of breast cancer   . Family history of ovarian cancer   . Family history of colon cancer   . Family history of leukemia   . Goals of care, counseling/discussion 08/07/2019  . Breast cancer ([place]) 07/30/2019  . GERD (gastroesophageal reflux disease) 06/30/2019  . Abdominal discomfort 05/15/2019  . Advance care planning 07/14/2018  . Health care maintenance 07/14/2018  . Depression, recurrent ([place]) 07/14/2018  . Constipation 07/14/2018  . Altered taste 07/14/2018  . Fibrocystic breast changes of both breasts 01/24/2018  . Vasomotor symptoms due to menopause 09/05/2017  . Herpes simplex vulvovaginitis 09/05/2017  . Skin nodule  05/25/2017  . Encounter for screening examination for infectious disease 03/18/2017  . Lipoma 03/18/2017  . Vaginitis 03/18/2017  . Radicular pain in left arm 07/27/2015  . Neuralgia and neuritis, unspecified 07/27/2015  . Fatigue 03/05/2015  . B12 deficiency 03/05/2015  . Skin rash 05/01/2014  . MVP (mitral valve prolapse) 04/30/2014  . Cyst of breast 04/05/2014  . Interstitial cystitis 03/31/2014  . Routine general medical examination at a health care facility 11/13/2011  . Anxiety and depression 10/06/2010  . BREAST CYSTS, BILATERAL 12/03/2008  . PALPITATIONS, CHRONIC 12/03/2008  . UNSPECIFIED VITAMIN D DEFICIENCY 09/10/2008  . HLD (hyperlipidemia) 09/10/2008  . IRRITABLE BOWEL SYNDROME 02/12/2008    Rosalyn Gess OTR/CLT 12/03/2019, 12:29 PM  [facility] [address] [place], [place], [place], [address] Phone: [phone]   Fax:  [phone]  Name: Debra Little MRN: [id number] Date of Birth: [birth year]

## 2019-12-04 ENCOUNTER — Encounter: Payer: Self-pay | Admitting: Oncology

## 2019-12-04 ENCOUNTER — Inpatient Hospital Stay: Payer: PPO

## 2019-12-04 DIAGNOSIS — C50111 Malignant neoplasm of central portion of right female breast: Secondary | ICD-10-CM | POA: Diagnosis not present

## 2019-12-04 DIAGNOSIS — C50112 Malignant neoplasm of central portion of left female breast: Secondary | ICD-10-CM | POA: Diagnosis not present

## 2019-12-04 DIAGNOSIS — Z9013 Acquired absence of bilateral breasts and nipples: Secondary | ICD-10-CM | POA: Diagnosis not present
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
## 2019-12-11 ENCOUNTER — Telehealth: Payer: Self-pay | Admitting: *Deleted

## 2019-12-11 NOTE — Telephone Encounter (Signed)
T/C made to patient Debra Little to determine her interest in the SWOG S1714 research study. Spoke with patient on 12/05/19 about the study and a copy of the ICF was emailed to her at that time. Patient states she has read through the consent form, but is still unsure whether she wants to participate. Plans made for me to call her back on Monday - 12/15/19 to get her final decision about participation in the study. Yolande Jolly, BSN, MHA, OCN 12/11/2019 11:46 AM

## 2019-12-15 ENCOUNTER — Other Ambulatory Visit: Payer: Self-pay

## 2019-12-15 ENCOUNTER — Encounter: Payer: Self-pay | Admitting: *Deleted

## 2019-12-15 DIAGNOSIS — Z171 Estrogen receptor negative status [ER-]: Secondary | ICD-10-CM

## 2019-12-15 DIAGNOSIS — C50511 Malignant neoplasm of lower-outer quadrant of right female breast: Secondary | ICD-10-CM

## 2019-12-15 NOTE — Progress Notes (Signed)
Patient pre screened for office appointment, no questions or concerns today. Patient reminded of upcoming appointment time and date. 

## 2019-12-15 NOTE — Research (Signed)
T/C made to patient at 2:00 this afternoon (at her request) to determine her interest in the [facility] S1714 Research study. Patient states she has decided to participate as long as it will not mean extra clinic visits for her and everything can be done at her regular appointments. Informed patient that we make every effort to complete all study-related procedures at scheduled clinic visits for this study. Patient states she will come in early in the morning to provide written consent to participate. She agreed to allow me to review the Informed Consent Form, version date: 01/20/2019, by phone this afternoon in order to save some time in the morning. The patient has a copy of the current ICF there at home. The entire form was reviewed page by page, allowing patient to ask questions as we reviewed. She was informed of the investigational nature of the study, that participation is strictly voluntary, that she can withdraw at any time and for any reason, and that she will not be paid for participating. Reviewed the schedule of study assessments, including all procedures and questionnaires that will be completed at each time point. Description of the neuropen and tuning fork assessments was provided to patient as well. Potential risks and benefits were discussed with patient along with the fact that there is no randomization - all participants receive the same assessments at the designated time points. Both required and optional labs were reviewed with time points for each and informing that the optional labs must be drawn peripherally toward the end of her Taxol infusions. Patient disclosed that she already has some mild numbness and tingling due to fibromyalgia, and she is being treated with gabapentin for this. She was previously given my contact information in case she has further questions. Patient to come to clinic prior to her scheduled appointment in the morning to provide written consent. All baseline study labs,  assessments and questionnaires will be completed prior to initiating Taxol infusion tomorrow morning. Patient found to meet all of the eligibility criteria and none of the ineligibility criteria for study participation by this RN, and Doreatha Martin will complete the 2nd eligibility review today. Yolande Jolly, BSN, MHA, OCN 12/15/2019 4:01 PM  2nd review for eligibility completed by Doreatha Martin, RN and she agrees that patient meets all of the eligibility and none of the ineligibility criteria for the [facility] S1714 Research study. Yolande Jolly, BSN, MHA, OCN 12/15/2019 4:46 PM

## 2019-12-16 ENCOUNTER — Other Ambulatory Visit: Payer: Self-pay

## 2019-12-16 ENCOUNTER — Encounter: Payer: Self-pay | Admitting: Oncology

## 2019-12-16 ENCOUNTER — Inpatient Hospital Stay: Payer: PPO

## 2019-12-16 ENCOUNTER — Inpatient Hospital Stay (HOSPITAL_BASED_OUTPATIENT_CLINIC_OR_DEPARTMENT_OTHER): Payer: PPO | Admitting: Oncology

## 2019-12-16 ENCOUNTER — Encounter: Payer: Self-pay | Admitting: *Deleted

## 2019-12-16 VITALS — BP 146/82 | HR 74 | Temp 97.7°F | Resp 20 | Wt 148.0 lb

## 2019-12-16 VITALS — BP 127/54 | HR 85 | Temp 96.7°F | Resp 16 | Wt 148.3 lb

## 2019-12-16 DIAGNOSIS — Z006 Encounter for examination for normal comparison and control in clinical research program: Secondary | ICD-10-CM | POA: Diagnosis not present

## 2019-12-16 DIAGNOSIS — T451X5A Adverse effect of antineoplastic and immunosuppressive drugs, initial encounter: Secondary | ICD-10-CM

## 2019-12-16 DIAGNOSIS — Z5111 Encounter for antineoplastic chemotherapy: Secondary | ICD-10-CM

## 2019-12-16 DIAGNOSIS — C50511 Malignant neoplasm of lower-outer quadrant of right female breast: Secondary | ICD-10-CM | POA: Diagnosis not present

## 2019-12-16 DIAGNOSIS — Z171 Estrogen receptor negative status [ER-]: Secondary | ICD-10-CM

## 2019-12-16 DIAGNOSIS — D6481 Anemia due to antineoplastic chemotherapy: Secondary | ICD-10-CM

## 2019-12-16 DIAGNOSIS — K219 Gastro-esophageal reflux disease without esophagitis: Secondary | ICD-10-CM

## 2019-12-16 LAB — CBC WITH DIFFERENTIAL/PLATELET
Abs Immature Granulocytes: 1.85 10*3/uL — ABNORMAL HIGH (ref 0.00–0.07)
Basophils Absolute: 0.1 10*3/uL (ref 0.0–0.1)
Basophils Relative: 1 %
Eosinophils Absolute: 0.1 10*3/uL (ref 0.0–0.5)
Eosinophils Relative: 1 %
HCT: 34.7 % — ABNORMAL LOW (ref 36.0–46.0)
Hemoglobin: 10.6 g/dL — ABNORMAL LOW (ref 12.0–15.0)
Immature Granulocytes: 15 %
Lymphocytes Relative: 6 %
Lymphs Abs: 0.8 10*3/uL (ref 0.7–4.0)
MCH: 27.1 pg (ref 26.0–34.0)
MCHC: 30.5 g/dL (ref 30.0–36.0)
MCV: 88.7 fL (ref 80.0–100.0)
Monocytes Absolute: 1.1 10*3/uL — ABNORMAL HIGH (ref 0.1–1.0)
Monocytes Relative: 9 %
Neutro Abs: 8.3 10*3/uL — ABNORMAL HIGH (ref 1.7–7.7)
Neutrophils Relative %: 68 %
Platelets: 276 10*3/uL (ref 150–400)
RBC: 3.91 MIL/uL (ref 3.87–5.11)
RDW: 12.8 % (ref 11.5–15.5)
WBC: 12.2 10*3/uL — ABNORMAL HIGH (ref 4.0–10.5)
nRBC: 0.2 % (ref 0.0–0.2)

## 2019-12-16 LAB — COMPREHENSIVE METABOLIC PANEL
ALT: 18 U/L (ref 0–44)
AST: 21 U/L (ref 15–41)
Albumin: 3.5 g/dL (ref 3.5–5.0)
Alkaline Phosphatase: 74 U/L (ref 38–126)
Anion gap: 7 (ref 5–15)
BUN: 12 mg/dL (ref 8–23)
CO2: 28 mmol/L (ref 22–32)
Calcium: 8.9 mg/dL (ref 8.9–10.3)
Chloride: 104 mmol/L (ref 98–111)
Creatinine, Ser: 0.76 mg/dL (ref 0.44–1.00)
GFR calc Af Amer: >60 mL/min (ref >60)
GFR calc non Af Amer: >60 mL/min (ref >60)
Glucose, Bld: 111 mg/dL — ABNORMAL HIGH (ref 70–99)
Potassium: 3.8 mmol/L (ref 3.5–5.1)
Sodium: 139 mmol/L (ref 135–145)
Total Bilirubin: 0.4 mg/dL (ref 0.3–1.2)
Total Protein: 6.6 g/dL (ref 6.5–8.1)

## 2019-12-16 MED ORDER — SODIUM CHLORIDE 0.9 % IV SOLN
80.0000 mg/m2 | Freq: Once | INTRAVENOUS | Status: AC
  Administered 2019-12-16: 138 mg via INTRAVENOUS
  Filled 2019-12-16: qty 23

## 2019-12-16 MED ORDER — FAMOTIDINE IN NACL 20-0.9 MG/50ML-% IV SOLN
20.0000 mg | Freq: Once | INTRAVENOUS | Status: AC
  Administered 2019-12-16: 20 mg via INTRAVENOUS
  Filled 2019-12-16: qty 50

## 2019-12-16 MED ORDER — SODIUM CHLORIDE 0.9 % IV SOLN
Freq: Once | INTRAVENOUS | Status: AC
  Filled 2019-12-16: qty 250

## 2019-12-16 MED ORDER — SODIUM CHLORIDE 0.9% FLUSH
10.0000 mL | Freq: Once | INTRAVENOUS | Status: AC
  Administered 2019-12-16: 10 mL via INTRAVENOUS
  Filled 2019-12-16: qty 10

## 2019-12-16 MED ORDER — PANTOPRAZOLE SODIUM 20 MG PO TBEC: 20.0000 mg | DELAYED_RELEASE_TABLET | Freq: Every day | ORAL | 1 refills | Status: DC

## 2019-12-16 MED ORDER — PALONOSETRON HCL INJECTION 0.25 MG/5ML
0.2500 mg | Freq: Once | INTRAVENOUS | Status: AC
  Administered 2019-12-16: 0.25 mg via INTRAVENOUS
  Filled 2019-12-16: qty 5

## 2019-12-16 MED ORDER — SODIUM CHLORIDE 0.9 % IV SOLN
20.0000 mg | Freq: Once | INTRAVENOUS | Status: AC
  Administered 2019-12-16: 20 mg via INTRAVENOUS
  Filled 2019-12-16: qty 2

## 2019-12-16 MED ORDER — DIPHENHYDRAMINE HCL 50 MG/ML IJ SOLN
50.0000 mg | Freq: Once | INTRAMUSCULAR | Status: AC
  Administered 2019-12-16: 50 mg via INTRAVENOUS
  Filled 2019-12-16: qty 1

## 2019-12-16 MED ORDER — HEPARIN SOD (PORK) LOCK FLUSH 100 UNIT/ML IV SOLN
500.0000 [IU] | Freq: Once | INTRAVENOUS | Status: AC
  Administered 2019-12-16: 500 [IU] via INTRAVENOUS
  Filled 2019-12-16: qty 5

## 2019-12-16 MED ORDER — HEPARIN SOD (PORK) LOCK FLUSH 100 UNIT/ML IV SOLN
500.0000 [IU] | Freq: Once | INTRAVENOUS | Status: DC | PRN
  Filled 2019-12-16: qty 5
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
## 2019-12-17 ENCOUNTER — Inpatient Hospital Stay: Payer: PPO

## 2019-12-18 NOTE — Progress Notes (Signed)
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 Tollie Pizza in the past and has undergone breast biopsies which did not previously showed malignancy. More recently patient noted some red discoloration around her right perioral as well as a palpable mass which led to a diagnostic mammogram on the right side her prior mammogram in February 2020 was normal in August 2020 she was noted to have a 2.7 x 2 x 2.3 cm mass in her right breast along with 4 morphologically abnormal lymph nodes. She underwent breast biopsy as well as lymph node biopsy which showed grade 3 invasive mammary carcinoma. ER PR and HER-2/neunegative.Sections show high-grade invasive carcinoma with focal squamous differentiation and pinpoint keratin ideation. The features are concerning for metaplastic differentiation.[name] Lymphovascular invasion present. There was extranodal extension present on the lymph node biopsy.   Her family history is significant for breast cancer in her mother in her 34s. Father had colon cancer in his70s.Family history also significant for ovarian cancer in her maternal aunt.  MRI of bilateral breasts showed: Primary right breast mass was 3.5 x 2.7 x 3.4 cm. There were surrounding numerous  nodules consistent with satellite lesions. Extensive nodular non-mass enhancement throughout the right breast involving the upper and lower quadrants spanning 6.2 x 4.5 x 5.5 cm. Markedly enhancement of the left breast diffusely. Non-mass enhancement measures 1.9 x 2.4 cm. At least 3 abnormal lymph nodes in the right axilla. No abnormal left axillary lymph nodes.   PET CT scan showed hypermetabolic possible satellite nodule just cephalad to the right breast lesion. Equivocal inferior breast and left axillary nodal hypermetabolism. No evidence of extrathoracic hypermetabolic metastases. Lateral right breast primary with right axillary nodal metastases  Patient had 3 cycles of neoadjuvant AC chemotherapy along with Keytruda. There was no significant response in her tumor and patient proceeded with bilateral mastectomy with targeted node dissection. 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 7 lymph nodes were positive for malignancy with extranodal extension overall cancer cellularity was 40% [id number].  Patient completed 1 cycle of adjuvant AC chemotherapy and will start weekly Taxol   Interval history-reports feeling fatigued but overall energy levels are improving.  She reports mild intermittent tingling in her extremities even before starting Taxol.  She says that the symptoms are not frequent and come and go  ECOG PS- 1 Pain scale- 0 Opioid associated constipation- no  Review of systems- Review of Systems  Constitutional: Positive for malaise/fatigue. Negative for chills, fever and weight loss.  HENT: Negative for congestion, ear discharge and nosebleeds.   Eyes: Negative for blurred vision.  Respiratory: Negative for cough, hemoptysis, sputum production, shortness of breath and wheezing.   Cardiovascular: Negative for chest pain, palpitations, orthopnea and claudication.  Gastrointestinal: Negative for abdominal pain, blood in stool, constipation,  diarrhea, heartburn, melena, nausea and vomiting.  Genitourinary: Negative for dysuria, flank pain,  frequency, hematuria and urgency.  Musculoskeletal: Negative for back pain, joint pain and myalgias.  Skin: Negative for rash.  Neurological: Negative for dizziness, tingling, focal weakness, seizures, weakness and headaches.  Endo/Heme/Allergies: Does not bruise/bleed easily.  Psychiatric/Behavioral: Negative for depression and suicidal ideas. The patient does not have insomnia.       Allergies  Allergen Reactions  . Amitiza [Lubiprostone] Other (See Comments)    Overly effective at higher dose  . Bentyl [Dicyclomine Hcl]     Lack of effect  . Celecoxib     REACTION: unspecified  . Cortisone Other (See Comments)    flush  . Mobic [Meloxicam]     Palpitations.  But can tolerate aleve  . Nitrofurantoin Monohyd Macro   . Sulfonamide Derivatives Nausea Only  . Zoloft [Sertraline Hcl] Diarrhea and Nausea Only  . Buspar [Buspirone] Palpitations  . Doxycycline Palpitations    chest pain  . Penicillins Rash     Past Medical History:  Diagnosis Date  . Anemia    d/t chemo - last Hgb 10.8  . Anxiety    sees Dr. [name]  . Breast cancer ([place]) 2020  . Cyst of breast    per [name]  . Dysrhythmia    Hx of palpitations  . Family history of breast cancer   . Family history of colon cancer   . Family history of leukemia   . Family history of ovarian cancer   . Fibromyalgia   . GERD (gastroesophageal reflux disease)   . Heart murmur   . Hemorrhoids   . Herpes, genital 04/2015   confirmed with HSV 2 IgG  . Hiatal hernia   . Hypercholesterolemia    Dr. Kyra Searles  . Hyperlipidemia   . IBS (irritable bowel syndrome)    constipation predominant  . IC (interstitial cystitis)    per [facility]  . Mild depression ([place])   . MVP (mitral valve prolapse)    [facility] cards eval 2012  . Osteopenia    2010/2017, DEXA at [facility];spine and fem neck  . PONV (postoperative nausea and  vomiting)   . Vitamin D deficiency    history of     Past Surgical History:  Procedure Laterality Date  . ABDOMINAL HYSTERECTOMY    . APPENDECTOMY    . BLADDER SURGERY     1980's  . BREAST EXCISIONAL BIOPSY Right    benign  . BREAST EXCISIONAL BIOPSY Bilateral    benign  . COLONOSCOPY  09/2014   Dr. [name]  . COLONOSCOPY  2009   at [facility] 1 POLYP (BENIGN)  . excision of breast cysts     hx of multiple cyst aspirations  . EXPLORATORY LAPAROTOMY    . MASTECTOMY MODIFIED RADICAL Bilateral 10/09/2019   Procedure: RIGHT MASTECTOMY MODIFIED RADICAL AND LEFT TOTAL MASTECTOMY;  Surgeon: Rolm Bookbinder, MD;  Location: [place];  Service: General;  Laterality: Bilateral;  . PORTA CATH INSERTION N/A 08/06/2019   Procedure: PORTA CATH INSERTION;  Surgeon: Katha Cabal, MD;  Location: [facility];  Service: Cardiovascular;  Laterality: N/A;  . PORTACATH PLACEMENT Left 08/05/2019   Procedure: INSERTION PORT-A-CATH, Attempted;  Surgeon: Jules Husbands, MD;  Location: [facility];  Service: General;  Laterality: Left;  . TUBAL LIGATION      Social History   Socioeconomic History  . Marital status: Divorced    Spouse name: Not on file  . Number of children: 1  . Years of education: Not on file  . Highest  education level: Not on file  Occupational History  . Occupation: [employer]  Tobacco Use  . Smoking status: Never Smoker  . Smokeless tobacco: Never Used  Substance and Sexual Activity  . Alcohol use: No    Alcohol/week: 0.0 standard drinks  . Drug use: No  . Sexual activity: Yes    Birth control/protection: Surgical    Comment: Hysterectomy  Other Topics Concern  . Not on file  Social History Narrative   Married in 01-15-1997, divorced as of 01/15/2017, 1 son from previous relationship   Brother with MVA at 27, in rest home for many years as of Jan 15, 2018- died of covid recently   Social Determinants of Radio broadcast assistant Strain:   . Difficulty of Paying Living Expenses: Not  on file  Food Insecurity:   . Worried About Charity fundraiser in the Last Year: Not on file  . Ran Out of Food in the Last Year: Not on file  Transportation Needs:   . Lack of Transportation (Medical): Not on file  . Lack of Transportation (Non-Medical): Not on file  Physical Activity:   . Days of Exercise per Week: Not on file  . Minutes of Exercise per Session: Not on file  Stress:   . Feeling of Stress : Not on file  Social Connections:   . Frequency of Communication with Friends and Family: Not on file  . Frequency of Social Gatherings with Friends and Family: Not on file  . Attends Religious Services: Not on file  . Active Member of Clubs or Organizations: Not on file  . Attends Archivist Meetings: Not on file  . Marital Status: Not on file  Intimate Partner Violence:   . Fear of Current or Ex-Partner: Not on file  . Emotionally Abused: Not on file  . Physically Abused: Not on file  . Sexually Abused: Not on file    Family History  Problem Relation Age of Onset  . Breast cancer Mother 34  . Hypertension Mother   . Colon cancer Father 59  . Hyperlipidemia Father   . Ovarian cancer Maternal Aunt 80  . Breast cancer Cousin   . Gallbladder disease Paternal Grandmother   . Liver cancer Cousin 70       Malignant  . Cancer Maternal Uncle        unsure type     Current Outpatient Medications:  .  acetaminophen (TYLENOL) 650 MG CR tablet, Take 650 mg by mouth every 8 (eight) hours as needed for pain., Disp: , Rfl:  .  Ascorbic Acid (VITAMIN C) 100 MG tablet, Take 100 mg by mouth daily., Disp: , Rfl:  .  atorvastatin (LIPITOR) 40 MG tablet, Take 40 mg by mouth at bedtime. , Disp: , Rfl: 1 .  cholecalciferol (VITAMIN D3) 25 MCG (1000 UT) tablet, Take 1,000 Units by mouth daily. , Disp: , Rfl:  .  clonazePAM (KLONOPIN) 0.5 MG tablet, Take 0.5 mg by mouth 2 (two) times daily. , Disp: , Rfl:  .  dexamethasone (DECADRON) 4 MG tablet, Take 2 tablets by mouth daily  starting the day after Carboplatin and Cytoxan x 3 days. Take with food., Disp: 30 tablet, Rfl: 1 .  DULoxetine (CYMBALTA) 60 MG capsule, Take 60 mg by mouth daily. , Disp: , Rfl:  .  gabapentin (NEURONTIN) 100 MG capsule, Take 1-2 capsules (100-200 mg total) by mouth 3 (three) times daily as needed., Disp: 90 capsule, Rfl: 1 .  HYDROcodone-acetaminophen (NORCO/VICODIN) 5-325  MG tablet, Take 1 tablet by mouth every 6 (six) hours as needed for moderate pain., Disp: 15 tablet, Rfl: 0 .  lidocaine-prilocaine (EMLA) cream, Apply to affected area once (Patient taking differently: Apply 1 application topically daily as needed [facility] access). ), Disp: 30 g, Rfl: 3 .  loratadine (CLARITIN) 10 MG tablet, Take 1 tablet (10 mg total) by mouth daily., Disp: , Rfl:  .  magic mouthwash w/lidocaine SOLN, Take 5 mLs by mouth 4 (four) times daily. 80 ml viscous lidocaine 2%, 80 ml Mylanta, 80 ml Diphenhydramine 12.5 mg/5 ml Elixir, 80 ml Nystatin 100,000 Unit suspension, 80 ml Prednisolone 15 mg/60m, 80 ml Distilled Water. Sig: Swish/Swallow 5-10 ml four times a day as needed. Dispense 480 ml. 3RFs, Disp: 480 mL, Rfl: 3 .  methocarbamol (ROBAXIN) 500 MG tablet, Take 1 tablet (500 mg total) by mouth every 6 (six) hours as needed for muscle spasms., Disp: 30 tablet, Rfl: 1 .  omeprazole (PRILOSEC) 20 MG capsule, Take 20 mg by mouth daily. , Disp: , Rfl:  .  ondansetron (ZOFRAN) 8 MG tablet, Take 1 tablet (8 mg total) by mouth 2 (two) times daily as needed. Start on the third day after chemotherapy., Disp: 30 tablet, Rfl: 1 .  Plecanatide (TRULANCE) 3 MG TABS, Take 3 mg by mouth every other day. , Disp: , Rfl:  .  prochlorperazine (COMPAZINE) 10 MG tablet, Take 1 tablet (10 mg total) by mouth every 6 (six) hours as needed (Nausea or vomiting)., Disp: 30 tablet, Rfl: 1 .  sennosides-docusate sodium (SENOKOT-S) 8.6-50 MG tablet, Take 1-2 tablets by mouth at bedtime. , Disp: , Rfl:  .  valACYclovir (VALTREX) 500 MG tablet,  Take 500 mg by mouth daily., Disp: , Rfl:  .  vitamin B-12 (CYANOCOBALAMIN) 500 MCG tablet, Take 500 mcg by mouth daily., Disp: , Rfl:  .  pantoprazole (PROTONIX) 20 MG tablet, Take 1 tablet (20 mg total) by mouth daily., Disp: 30 tablet, Rfl: 1  Physical exam:  Vitals:   12/16/19 0959  BP: (!) 127/54  Pulse: 85  Resp: 16  Temp: (!) 96.7 F (35.9 C)  TempSrc: Tympanic  Weight: 148 lb 4.8 oz (67.3 kg)   Physical Exam Constitutional:      General: She is not in acute distress. HENT:     Head: Normocephalic and atraumatic.  Eyes:     Pupils: Pupils are equal, round, and reactive to light.  Cardiovascular:     Rate and Rhythm: Normal rate and regular rhythm.     Heart sounds: Normal heart sounds.  Pulmonary:     Effort: Pulmonary effort is normal.     Breath sounds: Normal breath sounds.  Abdominal:     General: Bowel sounds are normal.     Palpations: Abdomen is soft.  Musculoskeletal:     Cervical back: Normal range of motion.  Skin:    General: Skin is warm and dry.  Neurological:     Mental Status: She is alert and oriented to person, place, and time.   Patient is s/p bilateral mastectomy without reconstruction.  Mastectomy scar has healed well and no concern for infection.  CMP Latest Ref Rng & Units 12/16/2019  Glucose 70 - 99 mg/dL 111(H)  BUN 8 - 23 mg/dL 12  Creatinine 0.44 - 1.00 mg/dL 0.76  Sodium 135 - 145 mmol/L 139  Potassium 3.5 - 5.1 mmol/L 3.8  Chloride 98 - 111 mmol/L 104  CO2 22 - 32 mmol/L 28  Calcium 8.9 -  10.3 mg/dL 8.9  Total Protein 6.5 - 8.1 g/dL 6.6  Total Bilirubin 0.3 - 1.2 mg/dL 0.4  Alkaline Phos 38 - 126 U/L 74  AST 15 - 41 U/L 21  ALT 0 - 44 U/L 18   CBC Latest Ref Rng & Units 12/16/2019  WBC 4.0 - 10.5 K/uL 12.2(H)  Hemoglobin 12.0 - 15.0 g/dL 10.6(L)  Hematocrit 36.0 - 46.0 % 34.7(L)  Platelets 150 - 400 K/uL 276      Assessment and plan- Patient is a 64 y.o. female withclinical prognostic stage IIIb cT2 cN1 M0 triple  negative invasive mammary carcinoma of the right breast.She is s/p 3 cycles of neoadjuvant AC Keytruda chemotherapy followed by bilateral mastectomy and targeted node dissection due to no significant response to neoadjuvant chemotherapy.  She went on to get bilateral mastectomy and node dissection which showed stage IIIc disease PT 2 PN2 a.  She is here for on treatment assessment prior to cycle 1 of weekly Taxol chemotherapy  Counts okay to proceed with cycle 1 of weekly Taxol chemotherapy today.  Patient reports occasional intermittent tingling numbness in her extremities which we will continue to monitor.  If it gets worse I will drop the dose of her Taxol and consider adding medications like Cymbalta or gabapentin.  I will see her back in 1 week's time for cycle 2 of Taxol.  Again discussed risks and benefits of Taxol chemotherapy including all but not limited to nausea, vomiting, low blood counts, risk of infections and hospitalization.  Risk of infusion reaction as well as peripheral neuropathy associated with Taxol.  Patient understands and agrees to proceed as planned.  Treatment will be given with a curative intent  Anemia: Likely secondary to ongoing chemotherapy.  Stable continue to monitor   Visit Diagnosis 1. Encounter for antineoplastic chemotherapy   2. Malignant neoplasm of lower-outer quadrant of right breast of female, estrogen receptor negative ([place])   3. Antineoplastic chemotherapy induced anemia      Dr. Randa Evens, MD, MPH [facility] at [facility] [phone] 12/18/2019 9:23 AM

## 2019-12-22 ENCOUNTER — Other Ambulatory Visit: Payer: Self-pay

## 2019-12-22 NOTE — Progress Notes (Signed)
Patient pre screened for office appointment, no questions or concerns today. Patient reminded of upcoming appointment time and date. 

## 2019-12-23 ENCOUNTER — Other Ambulatory Visit: Payer: Self-pay | Admitting: Obstetrics and Gynecology

## 2019-12-23 ENCOUNTER — Inpatient Hospital Stay: Payer: PPO

## 2019-12-23 ENCOUNTER — Other Ambulatory Visit: Payer: Self-pay

## 2019-12-23 ENCOUNTER — Ambulatory Visit: Payer: PPO

## 2019-12-23 ENCOUNTER — Encounter: Payer: Self-pay | Admitting: Oncology

## 2019-12-23 ENCOUNTER — Inpatient Hospital Stay (HOSPITAL_BASED_OUTPATIENT_CLINIC_OR_DEPARTMENT_OTHER): Payer: PPO | Admitting: Oncology

## 2019-12-23 VITALS — BP 120/62 | HR 85 | Temp 97.3°F | Wt 148.0 lb

## 2019-12-23 DIAGNOSIS — Z5111 Encounter for antineoplastic chemotherapy: Secondary | ICD-10-CM

## 2019-12-23 DIAGNOSIS — T451X5A Adverse effect of antineoplastic and immunosuppressive drugs, initial encounter: Secondary | ICD-10-CM | POA: Diagnosis not present

## 2019-12-23 DIAGNOSIS — Z171 Estrogen receptor negative status [ER-]: Secondary | ICD-10-CM

## 2019-12-23 DIAGNOSIS — C50511 Malignant neoplasm of lower-outer quadrant of right female breast: Secondary | ICD-10-CM | POA: Diagnosis not present

## 2019-12-23 DIAGNOSIS — D6481 Anemia due to antineoplastic chemotherapy: Secondary | ICD-10-CM | POA: Diagnosis not present

## 2019-12-23 DIAGNOSIS — A6004 Herpesviral vulvovaginitis: Secondary | ICD-10-CM

## 2019-12-23 LAB — CBC WITH DIFFERENTIAL/PLATELET
Abs Immature Granulocytes: 0.07 10*3/uL (ref 0.00–0.07)
Basophils Absolute: 0.1 10*3/uL (ref 0.0–0.1)
Basophils Relative: 1 %
Eosinophils Absolute: 0 10*3/uL (ref 0.0–0.5)
Eosinophils Relative: 0 %
HCT: 33.6 % — ABNORMAL LOW (ref 36.0–46.0)
Hemoglobin: 10.3 g/dL — ABNORMAL LOW (ref 12.0–15.0)
Immature Granulocytes: 1 %
Lymphocytes Relative: 9 %
Lymphs Abs: 0.6 10*3/uL — ABNORMAL LOW (ref 0.7–4.0)
MCH: 27.2 pg (ref 26.0–34.0)
MCHC: 30.7 g/dL (ref 30.0–36.0)
MCV: 88.7 fL (ref 80.0–100.0)
Monocytes Absolute: 0.7 10*3/uL (ref 0.1–1.0)
Monocytes Relative: 11 %
Neutro Abs: 4.8 10*3/uL (ref 1.7–7.7)
Neutrophils Relative %: 78 %
Platelets: 335 10*3/uL (ref 150–400)
RBC: 3.79 MIL/uL — ABNORMAL LOW (ref 3.87–5.11)
RDW: 12.9 % (ref 11.5–15.5)
WBC: 6.2 10*3/uL (ref 4.0–10.5)
nRBC: 0 % (ref 0.0–0.2)

## 2019-12-23 LAB — COMPREHENSIVE METABOLIC PANEL
ALT: 34 U/L (ref 0–44)
AST: 32 U/L (ref 15–41)
Albumin: 3.5 g/dL (ref 3.5–5.0)
Alkaline Phosphatase: 57 U/L (ref 38–126)
Anion gap: 7 (ref 5–15)
BUN: 13 mg/dL (ref 8–23)
CO2: 28 mmol/L (ref 22–32)
Calcium: 8.9 mg/dL (ref 8.9–10.3)
Chloride: 102 mmol/L (ref 98–111)
Creatinine, Ser: 0.6 mg/dL (ref 0.44–1.00)
GFR calc Af Amer: >60 mL/min (ref >60)
GFR calc non Af Amer: >60 mL/min (ref >60)
Glucose, Bld: 120 mg/dL — ABNORMAL HIGH (ref 70–99)
Potassium: 3.7 mmol/L (ref 3.5–5.1)
Sodium: 137 mmol/L (ref 135–145)
Total Bilirubin: 0.5 mg/dL (ref 0.3–1.2)
Total Protein: 6.5 g/dL (ref 6.5–8.1)

## 2019-12-23 MED ORDER — PALONOSETRON HCL INJECTION 0.25 MG/5ML
0.2500 mg | Freq: Once | INTRAVENOUS | Status: AC
  Administered 2019-12-23: 0.25 mg via INTRAVENOUS
  Filled 2019-12-23: qty 5

## 2019-12-23 MED ORDER — SODIUM CHLORIDE 0.9 % IV SOLN
80.0000 mg/m2 | Freq: Once | INTRAVENOUS | Status: AC
  Administered 2019-12-23: 138 mg via INTRAVENOUS
  Filled 2019-12-23: qty 23

## 2019-12-23 MED ORDER — DIPHENHYDRAMINE HCL 50 MG/ML IJ SOLN
50.0000 mg | Freq: Once | INTRAMUSCULAR | Status: AC
  Administered 2019-12-23: 50 mg via INTRAVENOUS
  Filled 2019-12-23: qty 1

## 2019-12-23 MED ORDER — FAMOTIDINE IN NACL 20-0.9 MG/50ML-% IV SOLN
20.0000 mg | Freq: Once | INTRAVENOUS | Status: AC
  Administered 2019-12-23: 20 mg via INTRAVENOUS
  Filled 2019-12-23: qty 50

## 2019-12-23 MED ORDER — VALACYCLOVIR HCL 500 MG PO TABS: 500.0000 mg | ORAL_TABLET | Freq: Every day | ORAL | 0 refills | Status: DC

## 2019-12-23 MED ORDER — SODIUM CHLORIDE 0.9% FLUSH
10.0000 mL | Freq: Once | INTRAVENOUS | Status: AC
  Administered 2019-12-23: 10 mL via INTRAVENOUS
  Filled 2019-12-23: qty 10

## 2019-12-23 MED ORDER — HEPARIN SOD (PORK) LOCK FLUSH 100 UNIT/ML IV SOLN
500.0000 [IU] | Freq: Once | INTRAVENOUS | Status: DC
  Filled 2019-12-23: qty 5

## 2019-12-23 MED ORDER — SODIUM CHLORIDE 0.9 % IV SOLN
Freq: Once | INTRAVENOUS | Status: AC
  Filled 2019-12-23: qty 250

## 2019-12-23 MED ORDER — HEPARIN SOD (PORK) LOCK FLUSH 100 UNIT/ML IV SOLN
500.0000 [IU] | Freq: Once | INTRAVENOUS | Status: AC | PRN
  Administered 2019-12-23: 500 [IU]
  Filled 2019-12-23: qty 5

## 2019-12-23 MED ORDER — HEPARIN SOD (PORK) LOCK FLUSH 100 UNIT/ML IV SOLN
INTRAVENOUS | Status: AC
  Filled 2019-12-23: qty 5

## 2019-12-23 MED ORDER — SODIUM CHLORIDE 0.9 % IV SOLN
20.0000 mg | Freq: Once | INTRAVENOUS | Status: AC
  Administered 2019-12-23: 20 mg via INTRAVENOUS
  Filled 2019-12-23: qty 2

## 2019-12-23 NOTE — Progress Notes (Signed)
Patient stated that she has had some constipation despite taking something for it. Patient also stated that she has had restless legs and elevated heart rate. Patient also stated that she is not able to sleep at night time. Patient would like to know if it's okay for her to continue taking her Klonopin since Dr. Nicolasa Ducking wants to stop her.

## 2019-12-23 NOTE — Progress Notes (Signed)
Rx RF valtrex prn dosing.

## 2019-12-25 NOTE — Progress Notes (Signed)
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 Tollie Pizza in the past and has undergone breast biopsies which did not previously showed malignancy. More recently patient noted some red discoloration around her right perioral as well as a palpable mass which led to a diagnostic mammogram on the right side her prior mammogram in February 2020 was normal in August 2020 she was noted to have a 2.7 x 2 x 2.3 cm mass in her right breast along with 4 morphologically abnormal lymph nodes. She underwent breast biopsy as well as lymph node biopsy which showed grade 3 invasive mammary carcinoma. ER PR and HER-2/neunegative.Sections show high-grade invasive carcinoma with focal squamous differentiation and pinpoint keratin ideation. The features are concerning for metaplastic differentiation.[name] Lymphovascular invasion present. There was extranodal extension present on the lymph node biopsy.   Her family history is significant for breast cancer in her mother in her 92s. Father had colon cancer in his70s.Family history also significant for ovarian cancer in her maternal aunt.  MRI of bilateral breasts showed: Primary right breast mass was 3.5 x 2.7 x 3.4 cm. There were surrounding numerous  nodules consistent with satellite lesions. Extensive nodular non-mass enhancement throughout the right breast involving the upper and lower quadrants spanning 6.2 x 4.5 x 5.5 cm. Markedly enhancement of the left breast diffusely. Non-mass enhancement measures 1.9 x 2.4 cm. At least 3 abnormal lymph nodes in the right axilla. No abnormal left axillary lymph nodes. 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 Equivocal inferior breast and left axillary nodal hypermetabolism. No evidence of extrathoracic hypermetabolic metastases. Lateral right breast primary with right axillary nodal metastases  Patient had 3 cycles of neoadjuvant AC chemotherapy along with Keytruda. There was no significant response in her tumor and patient proceeded with bilateral mastectomy with targeted node dissection. 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 7 lymph nodes were positive for malignancy with extranodal extension overall cancer cellularity was 40% [id number].  Patient completed 1 cycle of adjuvant AC chemotherapy and will start weekly Taxol   Interval history-neuropathy has remained stable overall as compared to her last treatment.  Overall tolerated chemotherapy well without any significant side effects  ECOG PS- 1 Pain scale- 0 Opioid associated constipation- no  Review of systems- Review of Systems  Constitutional: Positive for malaise/fatigue. Negative for chills, fever and weight loss.  HENT: Negative for congestion, ear discharge and nosebleeds.   Eyes: Negative for blurred vision.  Respiratory: Negative for cough, hemoptysis, sputum production, shortness of breath and wheezing.   Cardiovascular: Negative for chest pain, palpitations, orthopnea and claudication.  Gastrointestinal: Negative for abdominal pain, blood in stool, constipation, diarrhea,  heartburn, melena, nausea and vomiting.  Genitourinary: Negative for dysuria, flank pain, frequency, hematuria and urgency.  Musculoskeletal: Negative for back pain, joint pain and myalgias.  Skin: Negative for rash.  Neurological: Negative for dizziness, tingling, focal weakness, seizures, weakness and headaches.  Endo/Heme/Allergies: Does not bruise/bleed easily.  Psychiatric/Behavioral: Negative for depression and suicidal ideas. The patient does not have insomnia.       Allergies  Allergen Reactions  . Amitiza [Lubiprostone] Other (See Comments)    Overly effective at higher dose  . Bentyl [Dicyclomine Hcl]     Lack of effect  . Celecoxib     REACTION: unspecified  . Cortisone Other (See Comments)    flush  . Mobic [Meloxicam]     Palpitations.  But can tolerate aleve  . Nitrofurantoin Monohyd Macro   . Sulfonamide Derivatives Nausea Only  . Zoloft [Sertraline Hcl] Diarrhea and Nausea Only  . Buspar [Buspirone] Palpitations  . Doxycycline Palpitations    chest pain  . Penicillins Rash     Past Medical History:  Diagnosis Date  . Anemia    d/t chemo - last Hgb 10.8  . Anxiety    sees Dr. [name]  . Breast cancer ([place]) 2020  . Cyst of breast    per [name]  . Dysrhythmia    Hx of palpitations  . Family history of breast cancer   . Family history of colon cancer   . Family history of leukemia   . Family history of ovarian cancer   . Fibromyalgia   . GERD (gastroesophageal reflux disease)   . Heart murmur   . Hemorrhoids   . Herpes, genital 04/2015   confirmed with HSV 2 IgG  . Hiatal hernia   . Hypercholesterolemia    Dr. Kyra Searles  . Hyperlipidemia   . IBS (irritable bowel syndrome)    constipation predominant  . IC (interstitial cystitis)    per [facility]  . Mild depression ([place])   . MVP (mitral valve prolapse)    [facility] cards eval 2012  . Osteopenia    2010/2017, DEXA at [facility];spine and fem neck  . PONV (postoperative nausea and vomiting)    . Vitamin D deficiency    history of     Past Surgical History:  Procedure Laterality Date  . ABDOMINAL HYSTERECTOMY    . APPENDECTOMY    . BLADDER SURGERY     1980's  . BREAST EXCISIONAL BIOPSY Right    benign  . BREAST EXCISIONAL BIOPSY Bilateral    benign  . COLONOSCOPY  09/2014   Dr. [name]  . COLONOSCOPY  2009   at [facility] 1 POLYP (BENIGN)  . excision of breast cysts     hx of multiple cyst aspirations  . EXPLORATORY LAPAROTOMY    . MASTECTOMY MODIFIED RADICAL Bilateral 10/09/2019   Procedure: RIGHT MASTECTOMY MODIFIED RADICAL AND LEFT TOTAL MASTECTOMY;  Surgeon: Rolm Bookbinder, MD;  Location: [place];  Service: General;  Laterality: Bilateral;  . PORTA CATH INSERTION N/A 08/06/2019   Procedure: PORTA CATH INSERTION;  Surgeon: Katha Cabal, MD;  Location: [place] [facility];  Service: Cardiovascular;  Laterality: N/A;  . PORTACATH PLACEMENT Left 08/05/2019   Procedure: INSERTION PORT-A-CATH, Attempted;  Surgeon: Jules Husbands, MD;  Location: [facility];  Service: General;  Laterality: Left;  . TUBAL LIGATION      Social History   Socioeconomic History  . Marital status: Divorced    Spouse name: Not on file  . Number of children: 1  . Years of education: Not on file  . Highest education level: Not on file  Occupational History  . Occupation: [employer]  Tobacco Use  . Smoking status: Never Smoker  . Smokeless tobacco: Never Used  Substance and Sexual Activity  . Alcohol use: No    Alcohol/week: 0.0 standard drinks  . Drug use: No  . Sexual activity: Yes    Birth control/protection: Surgical    Comment: Hysterectomy  Other Topics Concern  . Not on file  Social History Narrative   Married in 01/02/97, divorced as of 01-02-2017, 1 son from previous relationship   Brother with MVA at 87, in rest home for many years as of 01-02-18- died of covid recently   Social Determinants of Radio broadcast assistant Strain:   . Difficulty of Paying Living Expenses: Not on file   Food Insecurity:   . Worried About Charity fundraiser in the Last Year: Not on file  . Ran Out of Food in the Last Year: Not on file  Transportation Needs:   . Lack of Transportation (Medical): Not on file  . Lack of Transportation (Non-Medical): Not on file  Physical Activity:   . Days of Exercise per Week: Not on file  . Minutes of Exercise per Session: Not on file  Stress:   . Feeling of Stress : Not on file  Social Connections:   . Frequency of Communication with Friends and Family: Not on file  . Frequency of Social Gatherings with Friends and Family: Not on file  . Attends Religious Services: Not on file  . Active Member of Clubs or Organizations: Not on file  . Attends Archivist Meetings: Not on file  . Marital Status: Not on file  Intimate Partner Violence:   . Fear of Current or Ex-Partner: Not on file  . Emotionally Abused: Not on file  . Physically Abused: Not on file  . Sexually Abused: Not on file    Family History  Problem Relation Age of Onset  . Breast cancer Mother 47  . Hypertension Mother   . Colon cancer Father 42  . Hyperlipidemia Father   . Ovarian cancer Maternal Aunt 80  . Breast cancer Cousin   . Gallbladder disease Paternal Grandmother   . Liver cancer Cousin 70       Malignant  . Cancer Maternal Uncle        unsure type     Current Outpatient Medications:  .  acetaminophen (TYLENOL) 650 MG CR tablet, Take 650 mg by mouth every 8 (eight) hours as needed for pain., Disp: , Rfl:  .  Ascorbic Acid (VITAMIN C) 100 MG tablet, Take 100 mg by mouth daily., Disp: , Rfl:  .  atorvastatin (LIPITOR) 40 MG tablet, Take 40 mg by mouth at bedtime. , Disp: , Rfl: 1 .  cholecalciferol (VITAMIN D3) 25 MCG (1000 UT) tablet, Take 1,000 Units by mouth daily. , Disp: , Rfl:  .  clonazePAM (KLONOPIN) 0.5 MG tablet, Take 0.5 mg by mouth 2 (two) times daily. , Disp: , Rfl:  .  dexamethasone (DECADRON) 4 MG tablet, Take 2 tablets by mouth daily starting  the day after Carboplatin and Cytoxan x 3 days. Take with food., Disp: 30 tablet, Rfl: 1 .  doxycycline (VIBRA-TABS) 100 MG tablet, Take 100 mg by mouth 2 (two) times daily., Disp: , Rfl:  .  DULoxetine (CYMBALTA) 60 MG capsule, Take 60 mg by mouth daily. , Disp: , Rfl:  .  gabapentin (NEURONTIN) 100 MG capsule, Take 1-2 capsules (100-200 mg total) by mouth 3 (three)  times daily as needed., Disp: 90 capsule, Rfl: 1 .  HYDROcodone-acetaminophen (NORCO/VICODIN) 5-325 MG tablet, Take 1 tablet by mouth every 6 (six) hours as needed for moderate pain., Disp: 15 tablet, Rfl: 0 .  lactulose (CHRONULAC) 10 GM/15ML solution, 30 mLs., Disp: , Rfl:  .  lidocaine-prilocaine (EMLA) cream, Apply to affected area once (Patient taking differently: Apply 1 application topically daily as needed [facility] access). ), Disp: 30 g, Rfl: 3 .  loratadine (CLARITIN) 10 MG tablet, Take 1 tablet (10 mg total) by mouth daily., Disp: , Rfl:  .  magic mouthwash w/lidocaine SOLN, Take 5 mLs by mouth 4 (four) times daily. 80 ml viscous lidocaine 2%, 80 ml Mylanta, 80 ml Diphenhydramine 12.5 mg/5 ml Elixir, 80 ml Nystatin 100,000 Unit suspension, 80 ml Prednisolone 15 mg/61m, 80 ml Distilled Water. Sig: Swish/Swallow 5-10 ml four times a day as needed. Dispense 480 ml. 3RFs, Disp: 480 mL, Rfl: 3 .  methocarbamol (ROBAXIN) 500 MG tablet, Take 1 tablet (500 mg total) by mouth every 6 (six) hours as needed for muscle spasms., Disp: 30 tablet, Rfl: 1 .  omeprazole (PRILOSEC) 20 MG capsule, Take 20 mg by mouth daily. , Disp: , Rfl:  .  ondansetron (ZOFRAN) 8 MG tablet, Take 1 tablet (8 mg total) by mouth 2 (two) times daily as needed. Start on the third day after chemotherapy., Disp: 30 tablet, Rfl: 1 .  pantoprazole (PROTONIX) 20 MG tablet, Take 1 tablet (20 mg total) by mouth daily., Disp: 30 tablet, Rfl: 1 .  Plecanatide (TRULANCE) 3 MG TABS, Take 3 mg by mouth every other day. , Disp: , Rfl:  .  prochlorperazine (COMPAZINE) 10 MG tablet,  Take 1 tablet (10 mg total) by mouth every 6 (six) hours as needed (Nausea or vomiting)., Disp: 30 tablet, Rfl: 1 .  sennosides-docusate sodium (SENOKOT-S) 8.6-50 MG tablet, Take 1-2 tablets by mouth at bedtime. , Disp: , Rfl:  .  sucralfate (CARAFATE) 1 g tablet, Take 1 g by mouth 3 (three) times daily., Disp: , Rfl:  .  valACYclovir (VALTREX) 500 MG tablet, Take 500 mg by mouth daily., Disp: , Rfl:  .  vitamin B-12 (CYANOCOBALAMIN) 500 MCG tablet, Take 500 mcg by mouth daily., Disp: , Rfl:   Physical exam:  Vitals:   12/23/19 0928 12/23/19 0931  BP:  120/62  Pulse:  85  Temp: (!) 97.3 F (36.3 C) (!) 97.3 F (36.3 C)  TempSrc: Tympanic Tympanic  Weight: 148 lb (67.1 kg) 148 lb (67.1 kg)   Physical Exam HENT:     Head: Normocephalic and atraumatic.  Eyes:     Pupils: Pupils are equal, round, and reactive to light.  Cardiovascular:     Rate and Rhythm: Normal rate and regular rhythm.     Heart sounds: Normal heart sounds.  Pulmonary:     Effort: Pulmonary effort is normal.     Breath sounds: Normal breath sounds.  Abdominal:     General: Bowel sounds are normal.     Palpations: Abdomen is soft.  Musculoskeletal:     Cervical back: Normal range of motion.  Skin:    General: Skin is warm and dry.  Neurological:     Mental Status: She is alert and oriented to person, place, and time.      CMP Latest Ref Rng & Units 12/23/2019  Glucose 70 - 99 mg/dL 120(H)  BUN 8 - 23 mg/dL 13  Creatinine 0.44 - 1.00 mg/dL 0.60  Sodium 135 - 145  mmol/L 137  Potassium 3.5 - 5.1 mmol/L 3.7  Chloride 98 - 111 mmol/L 102  CO2 22 - 32 mmol/L 28  Calcium 8.9 - 10.3 mg/dL 8.9  Total Protein 6.5 - 8.1 g/dL 6.5  Total Bilirubin 0.3 - 1.2 mg/dL 0.5  Alkaline Phos 38 - 126 U/L 57  AST 15 - 41 U/L 32  ALT 0 - 44 U/L 34   CBC Latest Ref Rng & Units 12/23/2019  WBC 4.0 - 10.5 K/uL 6.2  Hemoglobin 12.0 - 15.0 g/dL 10.3(L)  Hematocrit 36.0 - 46.0 % 33.6(L)  Platelets 150 - 400 K/uL 335       Assessment and plan- Patient is a 64 y.o. female withclinical prognostic stage IIIb cT2 cN1 M0 triple negative invasive mammary carcinoma of the right breast.She is s/p 3 cycles of neoadjuvant AC Keytruda chemotherapy followed by bilateral mastectomy and targeted node dissection due tonosignificant response to neoadjuvant chemotherapy.She went on to get bilateral mastectomy and node dissection which showed stage IIIc disease PT 2 PN2 a.  She is here for on treatment assessment prior to cycle 2 of weekly Taxol chemotherapy.  Counts okay to proceed with cycle 2 of weekly Taxol chemotherapy today.  Overall she has tolerated treatment well without any significant side effects.  She reports some symptoms of baseline peripheral neuropathy even before starting Taxol which we will continue to monitor.  She will receive cycle 3 Taxol next week and I will see her back in 2 weeks for cycle 4.  Chemotherapy-induced anemia: Stable continue to monitor   Visit Diagnosis 1. Encounter for antineoplastic chemotherapy   2. Malignant neoplasm of lower-outer quadrant of right breast of female, estrogen receptor negative ([place])   3. Antineoplastic chemotherapy induced anemia      Dr. Randa Evens, MD, MPH [facility] [phone] 12/25/2019 1:23 PM

## 2019-12-29 ENCOUNTER — Other Ambulatory Visit: Payer: Self-pay

## 2019-12-30 ENCOUNTER — Inpatient Hospital Stay: Payer: PPO | Attending: Oncology

## 2019-12-30 ENCOUNTER — Other Ambulatory Visit: Payer: Self-pay

## 2019-12-30 ENCOUNTER — Inpatient Hospital Stay: Payer: PPO

## 2019-12-30 ENCOUNTER — Telehealth: Payer: Self-pay | Admitting: *Deleted

## 2019-12-30 VITALS — BP 115/62 | HR 78 | Temp 97.5°F | Resp 20 | Wt 149.0 lb

## 2019-12-30 DIAGNOSIS — D6481 Anemia due to antineoplastic chemotherapy: Secondary | ICD-10-CM | POA: Diagnosis not present

## 2019-12-30 DIAGNOSIS — Z171 Estrogen receptor negative status [ER-]: Secondary | ICD-10-CM | POA: Diagnosis not present

## 2019-12-30 DIAGNOSIS — D0512 Intraductal carcinoma in situ of left breast: Secondary | ICD-10-CM | POA: Insufficient documentation

## 2019-12-30 DIAGNOSIS — E785 Hyperlipidemia, unspecified: Secondary | ICD-10-CM | POA: Diagnosis not present

## 2019-12-30 DIAGNOSIS — J3489 Other specified disorders of nose and nasal sinuses: Secondary | ICD-10-CM | POA: Insufficient documentation

## 2019-12-30 DIAGNOSIS — Z79899 Other long term (current) drug therapy: Secondary | ICD-10-CM | POA: Insufficient documentation

## 2019-12-30 DIAGNOSIS — Z170 Estrogen receptor positive status [ER+]: Secondary | ICD-10-CM | POA: Insufficient documentation

## 2019-12-30 DIAGNOSIS — C50511 Malignant neoplasm of lower-outer quadrant of right female breast: Secondary | ICD-10-CM | POA: Diagnosis not present

## 2019-12-30 DIAGNOSIS — Z803 Family history of malignant neoplasm of breast: Secondary | ICD-10-CM | POA: Insufficient documentation

## 2019-12-30 DIAGNOSIS — R5383 Other fatigue: Secondary | ICD-10-CM | POA: Diagnosis not present

## 2019-12-30 DIAGNOSIS — Z5111 Encounter for antineoplastic chemotherapy: Secondary | ICD-10-CM | POA: Insufficient documentation

## 2019-12-30 DIAGNOSIS — G620 Drug-induced polyneuropathy: Secondary | ICD-10-CM | POA: Insufficient documentation

## 2019-12-30 DIAGNOSIS — Z006 Encounter for examination for normal comparison and control in clinical research program: Secondary | ICD-10-CM | POA: Diagnosis not present

## 2019-12-30 DIAGNOSIS — T451X5A Adverse effect of antineoplastic and immunosuppressive drugs, initial encounter: Secondary | ICD-10-CM | POA: Insufficient documentation

## 2019-12-30 DIAGNOSIS — Z95828 Presence of other vascular implants and grafts: Secondary | ICD-10-CM

## 2019-12-30 DIAGNOSIS — Z800 Family history of malignant neoplasm of digestive organs: Secondary | ICD-10-CM | POA: Diagnosis not present

## 2019-12-30 DIAGNOSIS — R0981 Nasal congestion: Secondary | ICD-10-CM | POA: Diagnosis not present

## 2019-12-30 DIAGNOSIS — R5381 Other malaise: Secondary | ICD-10-CM | POA: Insufficient documentation

## 2019-12-30 DIAGNOSIS — Z8041 Family history of malignant neoplasm of ovary: Secondary | ICD-10-CM | POA: Diagnosis not present

## 2019-12-30 DIAGNOSIS — Z7952 Long term (current) use of systemic steroids: Secondary | ICD-10-CM | POA: Insufficient documentation

## 2019-12-30 LAB — COMPREHENSIVE METABOLIC PANEL
ALT: 27 U/L (ref 0–44)
AST: 23 U/L (ref 15–41)
Albumin: 3.5 g/dL (ref 3.5–5.0)
Alkaline Phosphatase: 57 U/L (ref 38–126)
Anion gap: 9 (ref 5–15)
BUN: 13 mg/dL (ref 8–23)
CO2: 26 mmol/L (ref 22–32)
Calcium: 8.7 mg/dL — ABNORMAL LOW (ref 8.9–10.3)
Chloride: 102 mmol/L (ref 98–111)
Creatinine, Ser: 0.79 mg/dL (ref 0.44–1.00)
GFR calc Af Amer: >60 mL/min (ref >60)
GFR calc non Af Amer: >60 mL/min (ref >60)
Glucose, Bld: 159 mg/dL — ABNORMAL HIGH (ref 70–99)
Potassium: 3.7 mmol/L (ref 3.5–5.1)
Sodium: 137 mmol/L (ref 135–145)
Total Bilirubin: 0.3 mg/dL (ref 0.3–1.2)
Total Protein: 6.3 g/dL — ABNORMAL LOW (ref 6.5–8.1)

## 2019-12-30 LAB — CBC WITH DIFFERENTIAL/PLATELET
Abs Immature Granulocytes: 0.03 10*3/uL (ref 0.00–0.07)
Basophils Absolute: 0 10*3/uL (ref 0.0–0.1)
Basophils Relative: 1 %
Eosinophils Absolute: 0.1 10*3/uL (ref 0.0–0.5)
Eosinophils Relative: 2 %
HCT: 31.6 % — ABNORMAL LOW (ref 36.0–46.0)
Hemoglobin: 9.9 g/dL — ABNORMAL LOW (ref 12.0–15.0)
Immature Granulocytes: 1 %
Lymphocytes Relative: 13 %
Lymphs Abs: 0.5 10*3/uL — ABNORMAL LOW (ref 0.7–4.0)
MCH: 27.7 pg (ref 26.0–34.0)
MCHC: 31.3 g/dL (ref 30.0–36.0)
MCV: 88.5 fL (ref 80.0–100.0)
Monocytes Absolute: 0.5 10*3/uL (ref 0.1–1.0)
Monocytes Relative: 12 %
Neutro Abs: 2.9 10*3/uL (ref 1.7–7.7)
Neutrophils Relative %: 71 %
Platelets: 294 10*3/uL (ref 150–400)
RBC: 3.57 MIL/uL — ABNORMAL LOW (ref 3.87–5.11)
RDW: 13.7 % (ref 11.5–15.5)
WBC: 4 10*3/uL (ref 4.0–10.5)
nRBC: 0 % (ref 0.0–0.2)

## 2019-12-30 MED ORDER — SODIUM CHLORIDE 0.9 % IV SOLN
20.0000 mg | Freq: Once | INTRAVENOUS | Status: AC
  Administered 2019-12-30: 20 mg via INTRAVENOUS
  Filled 2019-12-30: qty 2

## 2019-12-30 MED ORDER — SODIUM CHLORIDE 0.9 % IV SOLN
80.0000 mg/m2 | Freq: Once | INTRAVENOUS | Status: AC
  Administered 2019-12-30: 138 mg via INTRAVENOUS
  Filled 2019-12-30: qty 23

## 2019-12-30 MED ORDER — FAMOTIDINE IN NACL 20-0.9 MG/50ML-% IV SOLN
20.0000 mg | Freq: Once | INTRAVENOUS | Status: AC
  Administered 2019-12-30: 20 mg via INTRAVENOUS
  Filled 2019-12-30: qty 50

## 2019-12-30 MED ORDER — HEPARIN SOD (PORK) LOCK FLUSH 100 UNIT/ML IV SOLN
500.0000 [IU] | Freq: Once | INTRAVENOUS | Status: AC | PRN
  Administered 2019-12-30: 500 [IU]
  Filled 2019-12-30: qty 5

## 2019-12-30 MED ORDER — SODIUM CHLORIDE 0.9% FLUSH
10.0000 mL | Freq: Once | INTRAVENOUS | Status: AC
  Administered 2019-12-30: 10 mL via INTRAVENOUS
  Filled 2019-12-30: qty 10

## 2019-12-30 MED ORDER — HEPARIN SOD (PORK) LOCK FLUSH 100 UNIT/ML IV SOLN
INTRAVENOUS | Status: AC
  Filled 2019-12-30: qty 5

## 2019-12-30 MED ORDER — PALONOSETRON HCL INJECTION 0.25 MG/5ML
0.2500 mg | Freq: Once | INTRAVENOUS | Status: AC
  Administered 2019-12-30: 0.25 mg via INTRAVENOUS
  Filled 2019-12-30: qty 5

## 2019-12-30 MED ORDER — DIPHENHYDRAMINE HCL 50 MG/ML IJ SOLN
50.0000 mg | Freq: Once | INTRAMUSCULAR | Status: AC
  Administered 2019-12-30: 50 mg via INTRAVENOUS
  Filled 2019-12-30: qty 1

## 2019-12-30 MED ORDER — SODIUM CHLORIDE 0.9 % IV SOLN
Freq: Once | INTRAVENOUS | Status: AC
  Filled 2019-12-30: qty 250

## 2019-12-30 NOTE — Telephone Encounter (Signed)
She should try OTC saline spray which she can use 2-3 times per day

## 2019-12-30 NOTE — Telephone Encounter (Signed)
Patient called reporting that she is having a little bit of nose bleed in the mornings when she blows her nose, it only occurs in the morning. She states it is from the heat and that she is using humidifier and NS gel and is asking if there is anything else that needs to be sone or if Dr Janese Banks wants to order something for her. She has an appointment this afternoon for lab and infusion. Please advise

## 2019-12-30 NOTE — Telephone Encounter (Signed)
Call returned to patient and advised of md response. Repeated back to me

## 2019-12-31 ENCOUNTER — Telehealth: Payer: Self-pay | Admitting: *Deleted

## 2019-12-31 ENCOUNTER — Other Ambulatory Visit: Payer: Self-pay

## 2019-12-31 ENCOUNTER — Encounter: Payer: Self-pay | Admitting: *Deleted

## 2019-12-31 ENCOUNTER — Inpatient Hospital Stay: Payer: PPO | Admitting: Occupational Therapy

## 2019-12-31 DIAGNOSIS — M25611 Stiffness of right shoulder, not elsewhere classified: Secondary | ICD-10-CM

## 2019-12-31 DIAGNOSIS — L905 Scar conditions and fibrosis of skin: Secondary | ICD-10-CM

## 2019-12-31 DIAGNOSIS — Z171 Estrogen receptor negative status [ER-]: Secondary | ICD-10-CM

## 2019-12-31 DIAGNOSIS — C50511 Malignant neoplasm of lower-outer quadrant of right female breast: Secondary | ICD-10-CM

## 2019-12-31 MED ORDER — MAGIC MOUTHWASH W/LIDOCAINE: 5.0000 mL | Freq: Four times a day (QID) | ORAL | 3 refills | Status: DC

## 2019-12-31 NOTE — Research (Signed)
Received t/c from patient earlier this morning reporting she has decided to participate in the DCP-001 research study through the [place]. Patient had been given a copy of the informed consent form to take home and review on 12/23/2019, and a brief overview of the study was presented at that time as well. She had not made up her mind when approached for a decision yesterday, but decided to participate today and is in clinic for a scheduled PT consult. Patient reports she has read the consent and HIPAA forms and denies having any further questions related to the study. She is aware that participation is voluntary, that she will not be paid to participate, and that she can withdraw at any time and for any reason. She was alone for her initial discussion and presents by herself again today. Potential risks, mainly due to possibility of breach of information discussed with patient along with possible benefit for the greater good of future research discussed. Patient provided written consent to DCP-001 ICF for Protocol version date 05/20/19 and corresponding [place] HIPAA for the study. She has previously signed the [place] when she consented for the SWOG S1714 study on 12/16/2019. She was given copies of all signed consent forms for her records. Patient completed the study worksheet, which was reviewed by this RN for accuracy. Patient was previously provided with my contact information in the event she decides to withdraw, and she verified that she still has my card. Ms. Hoganson was thanked again for her willingness to participate in Research. Yolande Jolly, BSN, MHA, OCN 12/31/2019 12:37 PM

## 2019-12-31 NOTE — Telephone Encounter (Signed)
Called pt to let her know that I spoke to jack and he will cover MMW with lidocaine at [facility] for 3 times  And if she needs more in the future then call us and we will check again with jack to see what he can do. I will call [facility] and let them know. She was very thankful for the help.

## 2019-12-31 NOTE — Therapy (Signed)
[facility] [address] [place], [place], [place], [address] Phone: [phone]   Fax:  [phone]  Occupational Therapy Screen  Patient Details  Name: Debra Little MRN: [id number] Date of Birth: [birth year] No data recorded  Encounter Date: 12/31/2019  OT End of Session - 12/31/19 1806    Visit Number  0       Past Medical History:  Diagnosis Date  . Anemia    d/t chemo - last Hgb 10.8  . Anxiety    sees Dr. [name]  . Breast cancer ([place]) 2020  . Cyst of breast    per [name]  . Dysrhythmia    Hx of palpitations  . Family history of breast cancer   . Family history of colon cancer   . Family history of leukemia   . Family history of ovarian cancer   . Fibromyalgia   . GERD (gastroesophageal reflux disease)   . Heart murmur   . Hemorrhoids   . Herpes, genital 04/2015   confirmed with HSV 2 IgG  . Hiatal hernia   . Hypercholesterolemia    Dr. Kyra Searles  . Hyperlipidemia   . IBS (irritable bowel syndrome)    constipation predominant  . IC (interstitial cystitis)    per [facility]  . Mild depression ([place])   . MVP (mitral valve prolapse)    [facility] cards eval 2012  . Osteopenia    2010/2017, DEXA at [facility];spine and fem neck  . PONV (postoperative nausea and vomiting)   . Vitamin D deficiency    history of    Past Surgical History:  Procedure Laterality Date  . ABDOMINAL HYSTERECTOMY    . APPENDECTOMY    . BLADDER SURGERY     1980's  . BREAST EXCISIONAL BIOPSY Right    benign  . BREAST EXCISIONAL BIOPSY Bilateral    benign  . COLONOSCOPY  09/2014   Dr. [name]  . COLONOSCOPY  2009   at [facility] 1 POLYP (BENIGN)  . excision of breast cysts     hx of multiple cyst aspirations  . EXPLORATORY LAPAROTOMY    . MASTECTOMY MODIFIED RADICAL Bilateral 10/09/2019   Procedure: RIGHT MASTECTOMY MODIFIED RADICAL AND LEFT TOTAL MASTECTOMY;  Surgeon: Rolm Bookbinder, MD;  Location: [facility];  Service:  General;  Laterality: Bilateral;  . PORTA CATH INSERTION N/A 08/06/2019   Procedure: PORTA CATH INSERTION;  Surgeon: Katha Cabal, MD;  Location: [facility];  Service: Cardiovascular;  Laterality: N/A;  . PORTACATH PLACEMENT Left 08/05/2019   Procedure: INSERTION PORT-A-CATH, Attempted;  Surgeon: Jules Husbands, MD;  Location: [facility];  Service: General;  Laterality: Left;  . TUBAL LIGATION      There were no vitals filed for this visit.  Subjective Assessment - 12/31/19 1806    Subjective   I got my sleeve and wore it about 50% or more and with activities like carrying things or vacuuming - feels good -I have the jovipak on - if you can check it it looks okay    Currently in Pain?  No/denies          LYMPHEDEMA/ONCOLOGY QUESTIONNAIRE - 12/31/19 1126      Right Upper Extremity Lymphedema   15 cm Proximal to Olecranon Process  27.5 cm    10 cm Proximal to Olecranon Process  25.6 cm    Olecranon Process  22.4 cm    15 cm Proximal to Ulnar Styloid Process  21.4 cm  10 cm Proximal to Ulnar Styloid Process  18.5 cm    Just Proximal to Ulnar Styloid Process  14.4 cm    Across Hand at [employer]  17.6 cm    At [place] of 2nd Digit  6 cm    At [facility] of Thumb  5.6 cm        NOTE FROM 11/11/2019  Pt refer by Dr Janese Banks for OT to screen for education on lymphedema and ROM Post mastectomy. 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 The metaplastic pathology particularly make this aggressive. I would like to complete her adjuvant chemotherapy at this time and give her 1 cycle of AC chemotherapy followed by 12 cycles of weekly Taxol.  I will check PD-L1 status on her final pathology. If she has significant PD-L1 expression it would make sense to continue Keytruda which she has been approved for compassionate use through the drug company. However if she does not have any significant PD-L1 expression I will plan to drop her Keytruda.Treatment will be given with a curative intent  Patient is still healing from her mastectomy wound and I will give her about 3 weeks to see if she has recovered and ready to start chemo  Upon completion of chemotherapy I will refer her to radiation oncology to discuss adjuvant radiation treatment  OT note 11/11/2019: Pt complain about pain this am at hernia - she took some pain meds and than helped - she will call her GI MD. Pt is about 5 wks out from bilateral mastectomy with 9 axillary ln removed on R per pt. Scar healing very well on bilateral chest - but R lateral part still draining per pt after having infection and has bandage on- she do have a velcro tube compression garment on - Done with her antibiotic for about week per pt and next appt with Dr Donne Hazel is 30th Dec  Pt show decrease Shoulder AROM over head and external rotation - pt provide with some HEP for those to do and keep pain under 2/10  Pt ed on lymphedema and hand out provided - circumference WNL - pt is R hand dominant Will check on pt again in month   OT NOTE 12/03/2019:  Pt arrive with incision healed and starting chemo yesterday back again.  Incision healed but scar adhesions worse on R chest and axilla limiting her shoulder over head and external rotation - pt to do scar mobs in arm over head position - and  Cont with AAROM on wall for shoulder flexion , external  Scapula squeezes few times during day   Her L upper arm circumference to elbow increase compare to 3 wks ago and recommend for pt to get over the counter compression sleeve and glove - she moved this past weekend and was picking upr things per pt she should have not - but did not had a lot of help  Also would recommend unilateral post mastectomy jovipak breast pad to use during day - pt report some swelling  under scars - but could be too because of scars adhesions Want to be preventative prior to radiation in future too   Pt to call me when she get compression garments   NOTE FROM 12/31/2019: Pt arrive with her  unilateral post mastectomy jovipak breast pad on under her bra - looks good fit - review wearing it when she done a lot of heavy lifting or repetition and feels her chest or under arm is swollen - to wear it  Want to be preventative prior to radiation in future too  Her L upper arm circumference to elbow increase last time compare to the 3 wks before than - she arrive with her [place] strong  compression sleeve  - circumference decrease in R UE -and pt to only wear it now with high risk activities - do not have to wear glove -except if swelling in the hand     Incision healed but scar  Tight under arm and axilla on R limiting her shoulder over head and external rotation end range  - pt to do scar mobs in arm over head position - and  Cont with AAROM on wall for shoulder flexion , external  Scapula squeezes few times during day    Pt to follow up one more time in month                                  Patient will benefit from skilled therapeutic intervention in order to improve the following deficits and impairments:           Visit Diagnosis: Scar condition and fibrosis of skin  Stiffness of right shoulder, not elsewhere classified    Problem List Patient Active Problem List   Diagnosis Date Noted  . Bilateral breast cancer ([place]) 10/09/2019  . Genetic testing 08/27/2019  .  Family history of breast cancer   . Family history of ovarian cancer   . Family history of colon cancer   . Family history of leukemia   . Goals of care, counseling/discussion 08/07/2019  . Breast cancer ([place]) 07/30/2019  . GERD (gastroesophageal reflux disease) 06/30/2019  . Abdominal discomfort 05/15/2019  . Advance care planning 07/14/2018  . Health care maintenance 07/14/2018  . Depression, recurrent ([place]) 07/14/2018  . Constipation 07/14/2018  . Altered taste 07/14/2018  . Fibrocystic breast changes of both breasts 01/24/2018  . Vasomotor symptoms due to menopause 09/05/2017  . Herpes simplex vulvovaginitis 09/05/2017  . Skin nodule 05/25/2017  . Encounter for screening examination for infectious disease 03/18/2017  . Lipoma 03/18/2017  . Vaginitis 03/18/2017  . Radicular pain in left arm 07/27/2015  . Neuralgia and neuritis, unspecified 07/27/2015  . Fatigue 03/05/2015  . B12 deficiency 03/05/2015  . Skin rash 05/01/2014  . MVP (mitral valve prolapse) 04/30/2014  . Cyst of breast 04/05/2014  . Interstitial cystitis 03/31/2014  . Routine general medical examination at a health care facility 11/13/2011  . Anxiety and depression 10/06/2010  . BREAST CYSTS, BILATERAL 12/03/2008  . PALPITATIONS, CHRONIC 12/03/2008  . UNSPECIFIED VITAMIN D DEFICIENCY 09/10/2008  . HLD (hyperlipidemia) 09/10/2008  . IRRITABLE BOWEL SYNDROME 02/12/2008    Rosalyn Gess  OTR/L,CLT 12/31/2019, 6:07 PM  [facility] [address] [place], [place], [place], [address] Phone: [phone]   Fax:  [phone]  Name: Debra Little MRN: [id number] Date of Birth: [birth year]

## 2020-01-06 ENCOUNTER — Inpatient Hospital Stay: Payer: PPO

## 2020-01-06 ENCOUNTER — Inpatient Hospital Stay (HOSPITAL_BASED_OUTPATIENT_CLINIC_OR_DEPARTMENT_OTHER): Payer: PPO | Admitting: Oncology

## 2020-01-06 ENCOUNTER — Encounter: Payer: Self-pay | Admitting: Oncology

## 2020-01-06 ENCOUNTER — Other Ambulatory Visit: Payer: Self-pay

## 2020-01-06 VITALS — BP 128/63 | HR 79 | Temp 97.4°F | Wt 150.0 lb

## 2020-01-06 DIAGNOSIS — Z171 Estrogen receptor negative status [ER-]: Secondary | ICD-10-CM

## 2020-01-06 DIAGNOSIS — D6481 Anemia due to antineoplastic chemotherapy: Secondary | ICD-10-CM

## 2020-01-06 DIAGNOSIS — C50511 Malignant neoplasm of lower-outer quadrant of right female breast: Secondary | ICD-10-CM

## 2020-01-06 DIAGNOSIS — Z5111 Encounter for antineoplastic chemotherapy: Secondary | ICD-10-CM

## 2020-01-06 DIAGNOSIS — G620 Drug-induced polyneuropathy: Secondary | ICD-10-CM | POA: Diagnosis not present

## 2020-01-06 DIAGNOSIS — T451X5A Adverse effect of antineoplastic and immunosuppressive drugs, initial encounter: Secondary | ICD-10-CM

## 2020-01-06 DIAGNOSIS — Z95828 Presence of other vascular implants and grafts: Secondary | ICD-10-CM

## 2020-01-06 LAB — CBC WITH DIFFERENTIAL/PLATELET
Abs Immature Granulocytes: 0.06 10*3/uL (ref 0.00–0.07)
Basophils Absolute: 0.1 10*3/uL (ref 0.0–0.1)
Basophils Relative: 1 %
Eosinophils Absolute: 0.2 10*3/uL (ref 0.0–0.5)
Eosinophils Relative: 3 %
HCT: 31.8 % — ABNORMAL LOW (ref 36.0–46.0)
Hemoglobin: 10.1 g/dL — ABNORMAL LOW (ref 12.0–15.0)
Immature Granulocytes: 1 %
Lymphocytes Relative: 9 %
Lymphs Abs: 0.6 10*3/uL — ABNORMAL LOW (ref 0.7–4.0)
MCH: 27.9 pg (ref 26.0–34.0)
MCHC: 31.8 g/dL (ref 30.0–36.0)
MCV: 87.8 fL (ref 80.0–100.0)
Monocytes Absolute: 0.7 10*3/uL (ref 0.1–1.0)
Monocytes Relative: 12 %
Neutro Abs: 4.4 10*3/uL (ref 1.7–7.7)
Neutrophils Relative %: 74 %
Platelets: 239 10*3/uL (ref 150–400)
RBC: 3.62 MIL/uL — ABNORMAL LOW (ref 3.87–5.11)
RDW: 14.3 % (ref 11.5–15.5)
WBC: 6 10*3/uL (ref 4.0–10.5)
nRBC: 0 % (ref 0.0–0.2)

## 2020-01-06 LAB — COMPREHENSIVE METABOLIC PANEL
ALT: 27 U/L (ref 0–44)
AST: 21 U/L (ref 15–41)
Albumin: 3.6 g/dL (ref 3.5–5.0)
Alkaline Phosphatase: 54 U/L (ref 38–126)
Anion gap: 7 (ref 5–15)
BUN: 14 mg/dL (ref 8–23)
CO2: 28 mmol/L (ref 22–32)
Calcium: 8.8 mg/dL — ABNORMAL LOW (ref 8.9–10.3)
Chloride: 102 mmol/L (ref 98–111)
Creatinine, Ser: 0.64 mg/dL (ref 0.44–1.00)
GFR calc Af Amer: >60 mL/min (ref >60)
GFR calc non Af Amer: >60 mL/min (ref >60)
Glucose, Bld: 102 mg/dL — ABNORMAL HIGH (ref 70–99)
Potassium: 3.6 mmol/L (ref 3.5–5.1)
Sodium: 137 mmol/L (ref 135–145)
Total Bilirubin: 0.5 mg/dL (ref 0.3–1.2)
Total Protein: 6.3 g/dL — ABNORMAL LOW (ref 6.5–8.1)

## 2020-01-06 MED ORDER — SODIUM CHLORIDE 0.9 % IV SOLN: 80.0000 mg/m2 | Freq: Once | INTRAVENOUS | Status: DC

## 2020-01-06 MED ORDER — DIPHENHYDRAMINE HCL 50 MG/ML IJ SOLN
50.0000 mg | Freq: Once | INTRAMUSCULAR | Status: AC
  Administered 2020-01-06: 50 mg via INTRAVENOUS
  Filled 2020-01-06: qty 1

## 2020-01-06 MED ORDER — SODIUM CHLORIDE 0.9% FLUSH
10.0000 mL | Freq: Once | INTRAVENOUS | Status: AC
  Administered 2020-01-06: 10 mL via INTRAVENOUS
  Filled 2020-01-06: qty 10

## 2020-01-06 MED ORDER — SODIUM CHLORIDE 0.9 % IV SOLN
Freq: Once | INTRAVENOUS | Status: AC
  Filled 2020-01-06: qty 250

## 2020-01-06 MED ORDER — PALONOSETRON HCL INJECTION 0.25 MG/5ML
0.2500 mg | Freq: Once | INTRAVENOUS | Status: AC
  Administered 2020-01-06: 0.25 mg via INTRAVENOUS
  Filled 2020-01-06: qty 5

## 2020-01-06 MED ORDER — HEPARIN SOD (PORK) LOCK FLUSH 100 UNIT/ML IV SOLN
500.0000 [IU] | Freq: Once | INTRAVENOUS | Status: AC | PRN
  Administered 2020-01-06: 500 [IU]
  Filled 2020-01-06: qty 5

## 2020-01-06 MED ORDER — SODIUM CHLORIDE 0.9 % IV SOLN
20.0000 mg | Freq: Once | INTRAVENOUS | Status: AC
  Administered 2020-01-06: 20 mg via INTRAVENOUS
  Filled 2020-01-06: qty 2

## 2020-01-06 MED ORDER — FAMOTIDINE IN NACL 20-0.9 MG/50ML-% IV SOLN
20.0000 mg | Freq: Once | INTRAVENOUS | Status: AC
  Administered 2020-01-06: 20 mg via INTRAVENOUS
  Filled 2020-01-06: qty 50

## 2020-01-06 MED ORDER — SODIUM CHLORIDE 0.9 % IV SOLN
65.0000 mg/m2 | Freq: Once | INTRAVENOUS | Status: AC
  Administered 2020-01-06: 114 mg via INTRAVENOUS
  Filled 2020-01-06: qty 19

## 2020-01-06 NOTE — Progress Notes (Signed)
Pt as some bloody drainage with sputum in am and sometimes at night. She uses vaporizer in her room and saline mist several times a day for her nasal cavity. She also has temple pains that come and goes quickly. Pt has incontinence a lot and it was a problem prior to cancer dx and she is going to urology this week

## 2020-01-09 NOTE — Progress Notes (Signed)
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 Tollie Pizza in the past and has undergone breast biopsies which did not previously showed malignancy. More recently patient noted some red discoloration around her right perioral as well as a palpable mass which led to a diagnostic mammogram on the right side her prior mammogram in February 2020 was normal in August 2020 she was noted to have a 2.7 x 2 x 2.3 cm mass in her right breast along with 4 morphologically abnormal lymph nodes. She underwent breast biopsy as well as lymph node biopsy which showed grade 3 invasive mammary carcinoma. ER PR and HER-2/neunegative.Sections show high-grade invasive carcinoma with focal squamous differentiation and pinpoint keratin ideation. The features are concerning for metaplastic differentiation.[name] Lymphovascular invasion present. There was extranodal extension present on the lymph node biopsy.   Her family history is significant for breast cancer in her mother in her 60s. Father had colon cancer in his70s.Family history also significant for ovarian cancer in her maternal aunt.  MRI of bilateral breasts showed: Primary right breast mass was 3.5 x 2.7 x 3.4 cm. There were surrounding numerous  nodules consistent with satellite lesions. Extensive nodular non-mass enhancement throughout the right breast involving the upper and lower quadrants spanning 6.2 x 4.5 x 5.5 cm. Markedly enhancement of the left breast diffusely. Non-mass enhancement measures 1.9 x 2.4 cm. At least 3 abnormal lymph nodes in the right axilla. No abnormal left axillary lymph nodes. 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 Equivocal inferior breast and left axillary nodal hypermetabolism. No evidence of extrathoracic hypermetabolic metastases. Lateral right breast primary with right axillary nodal metastases  Patient had 3 cycles of neoadjuvant AC chemotherapy along with Keytruda. There was no significant response in her tumor and patient proceeded with bilateral mastectomy with targeted node dissection. 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 7 lymph nodes were positive for malignancy with extranodal extension overall cancer cellularity was 40% [id number].Patient completed 1 cycle of adjuvant AC chemotherapy and will start weekly Taxol   Interval history-reports having occasional blood-tinged nasal discharge.  She has been using a humidifier as well as nasal saline spray.  These episodes are occasional and self-limited.  She has not required to do any other maneuvers such as pinching her nose as the bleeding is minimal.  Reports having pain in her bilateral temples and feeling congested in her nose.  Reports that her neuropathy in her hands is mildly worse but comes and goes.  Rates it 3 out of 10  ECOG PS- 1 Pain scale- 2 Opioid associated constipation- no  Review of systems- Review of Systems  Constitutional: Negative for chills, fever, malaise/fatigue and weight loss.  HENT: Positive for congestion and nosebleeds. Negative for ear discharge.  Eyes:  Negative for blurred vision.  Respiratory: Negative for cough, hemoptysis, sputum production, shortness of breath and wheezing.   Cardiovascular: Negative for chest pain, palpitations, orthopnea and claudication.  Gastrointestinal: Negative for abdominal pain, blood in stool, constipation, diarrhea, heartburn, melena, nausea and vomiting.  Genitourinary: Negative for dysuria, flank pain, frequency, hematuria and urgency.  Musculoskeletal: Negative for back pain, joint pain and myalgias.  Skin: Negative for rash.  Neurological: Positive for sensory change (Peripheral neuropathy). Negative for dizziness, tingling, focal weakness, seizures, weakness and headaches.  Endo/Heme/Allergies: Does not bruise/bleed easily.  Psychiatric/Behavioral: Negative for depression and suicidal ideas. The patient does not have insomnia.       Allergies  Allergen Reactions  . Amitiza [Lubiprostone] Other (See Comments)    Overly effective at higher dose  . Bentyl [Dicyclomine Hcl]     Lack of effect  . Celecoxib     REACTION: unspecified  . Cortisone Other (See Comments)    flush  . Mobic [Meloxicam]     Palpitations.  But can tolerate aleve  . Nitrofurantoin Monohyd Macro   . Sulfonamide Derivatives Nausea Only  . Zoloft [Sertraline Hcl] Diarrhea and Nausea Only  . Buspar [Buspirone] Palpitations  . Doxycycline Palpitations    chest pain  . Penicillins Rash     Past Medical History:  Diagnosis Date  . Anemia    d/t chemo - last Hgb 10.8  . Anxiety    sees Dr. [name]  . Breast cancer ([place]) 2020  . Cyst of breast    per [name]  . Dysrhythmia    Hx of palpitations  . Family history of breast cancer   . Family history of colon cancer   . Family history of leukemia   . Family history of ovarian cancer   . Fibromyalgia   . GERD (gastroesophageal reflux disease)   . Heart murmur   . Hemorrhoids   . Herpes, genital 04/2015   confirmed with HSV 2 IgG  . Hiatal hernia   .  Hypercholesterolemia    Dr. Kyra Searles  . Hyperlipidemia   . IBS (irritable bowel syndrome)    constipation predominant  . IC (interstitial cystitis)    per [facility]  . Mild depression ([place])   . MVP (mitral valve prolapse)    [facility] cards eval 2012  . Osteopenia    2010/2017, DEXA at [facility];spine and fem neck  . PONV (postoperative nausea and vomiting)   . Vitamin D deficiency    history of     Past Surgical History:  Procedure Laterality Date  . ABDOMINAL HYSTERECTOMY    . APPENDECTOMY    . BLADDER SURGERY     1980's  . BREAST EXCISIONAL BIOPSY Right    benign  . BREAST EXCISIONAL BIOPSY Bilateral    benign  . COLONOSCOPY  09/2014   Dr. [name]  . COLONOSCOPY  2009   at [facility] 1 POLYP (BENIGN)  . excision of breast cysts     hx of multiple cyst aspirations  . EXPLORATORY LAPAROTOMY    . MASTECTOMY MODIFIED RADICAL Bilateral 10/09/2019   Procedure: RIGHT MASTECTOMY MODIFIED RADICAL AND LEFT TOTAL MASTECTOMY;  Surgeon: Rolm Bookbinder, MD;  Location: [place];  Service: General;  Laterality: Bilateral;  . PORTA CATH INSERTION N/A 08/06/2019   Procedure: PORTA CATH INSERTION;  Surgeon: Katha Cabal, MD;  Location: [place] [facility];  Service: Cardiovascular;  Laterality: N/A;  . PORTACATH PLACEMENT Left 08/05/2019   Procedure: INSERTION PORT-A-CATH, Attempted;  Surgeon:  Jules Husbands, MD;  Location: [facility];  Service: General;  Laterality: Left;  . TUBAL LIGATION      Social History   Socioeconomic History  . Marital status: Divorced    Spouse name: Not on file  . Number of children: 1  . Years of education: Not on file  . Highest education level: Not on file  Occupational History  . Occupation: [employer]  Tobacco Use  . Smoking status: Never Smoker  . Smokeless tobacco: Never Used  Substance and Sexual Activity  . Alcohol use: No    Alcohol/week: 0.0 standard drinks  . Drug use: No  . Sexual activity: Not Currently    Birth control/protection: Surgical      Comment: Hysterectomy  Other Topics Concern  . Not on file  Social History Narrative   Married in Jan 01, 1997, divorced as of 01/01/2017, 1 son from previous relationship   Brother with MVA at 77, in rest home for many years as of 01/01/18- died of covid recently   Social Determinants of Radio broadcast assistant Strain:   . Difficulty of Paying Living Expenses: Not on file  Food Insecurity:   . Worried About Charity fundraiser in the Last Year: Not on file  . Ran Out of Food in the Last Year: Not on file  Transportation Needs:   . Lack of Transportation (Medical): Not on file  . Lack of Transportation (Non-Medical): Not on file  Physical Activity:   . Days of Exercise per Week: Not on file  . Minutes of Exercise per Session: Not on file  Stress:   . Feeling of Stress : Not on file  Social Connections:   . Frequency of Communication with Friends and Family: Not on file  . Frequency of Social Gatherings with Friends and Family: Not on file  . Attends Religious Services: Not on file  . Active Member of Clubs or Organizations: Not on file  . Attends Archivist Meetings: Not on file  . Marital Status: Not on file  Intimate Partner Violence:   . Fear of Current or Ex-Partner: Not on file  . Emotionally Abused: Not on file  . Physically Abused: Not on file  . Sexually Abused: Not on file    Family History  Problem Relation Age of Onset  . Breast cancer Mother 46  . Hypertension Mother   . Colon cancer Father 25  . Hyperlipidemia Father   . Ovarian cancer Maternal Aunt 80  . Breast cancer Cousin   . Gallbladder disease Paternal Grandmother   . Liver cancer Cousin 70       Malignant  . Cancer Maternal Uncle        unsure type     Current Outpatient Medications:  .  acetaminophen (TYLENOL) 650 MG CR tablet, Take 650 mg by mouth every 8 (eight) hours as needed for pain., Disp: , Rfl:  .  Ascorbic Acid (VITAMIN C) 100 MG tablet, Take 100 mg by mouth daily., Disp: , Rfl:  .   atorvastatin (LIPITOR) 40 MG tablet, Take 40 mg by mouth at bedtime. , Disp: , Rfl: 1 .  cholecalciferol (VITAMIN D3) 25 MCG (1000 UT) tablet, Take 1,000 Units by mouth daily. , Disp: , Rfl:  .  clonazePAM (KLONOPIN) 0.5 MG tablet, Take 0.5 mg by mouth 2 (two) times daily. , Disp: , Rfl:  .  DULoxetine (CYMBALTA) 60 MG capsule, Take 60 mg by mouth daily. , Disp: , Rfl:  .  gabapentin (NEURONTIN) 100 MG capsule, Take 1-2 capsules (100-200 mg total) by mouth 3 (three) times daily as needed., Disp: 90 capsule, Rfl: 1 .  lactulose (CHRONULAC) 10 GM/15ML solution, Take 20 g by mouth daily as needed. , Disp: , Rfl:  .  lidocaine-prilocaine (EMLA) cream, Apply to affected area once (Patient taking differently: Apply 1 application topically daily as needed [facility] access). ), Disp: 30 g, Rfl: 3 .  loratadine (CLARITIN) 10 MG tablet, Take 1 tablet (10 mg total) by mouth daily., Disp: , Rfl:  .  magic mouthwash w/lidocaine SOLN, Take 5 mLs by mouth 4 (four) times daily. 80 ml viscous lidocaine 2%, 80 ml Mylanta, 80 ml Diphenhydramine 12.5 mg/5 ml Elixir, 80 ml Nystatin 100,000 Unit suspension, 80 ml Prednisolone 15 mg/21m, 80 ml Distilled Water. Sig: Swish/Swallow 5-10 ml four times a day as needed. Dispense 480 ml. 3RFs, Disp: 480 mL, Rfl: 3 .  ondansetron (ZOFRAN) 8 MG tablet, Take 1 tablet (8 mg total) by mouth 2 (two) times daily as needed. Start on the third day after chemotherapy., Disp: 30 tablet, Rfl: 1 .  pantoprazole (PROTONIX) 20 MG tablet, Take 1 tablet (20 mg total) by mouth daily., Disp: 30 tablet, Rfl: 1 .  Plecanatide (TRULANCE) 3 MG TABS, Take 3 mg by mouth daily. , Disp: , Rfl:  .  prochlorperazine (COMPAZINE) 10 MG tablet, Take 1 tablet (10 mg total) by mouth every 6 (six) hours as needed (Nausea or vomiting)., Disp: 30 tablet, Rfl: 1 .  sennosides-docusate sodium (SENOKOT-S) 8.6-50 MG tablet, Take 1-2 tablets by mouth at bedtime. , Disp: , Rfl:  .  valACYclovir (VALTREX) 500 MG tablet, Take  500 mg by mouth daily., Disp: , Rfl:  .  vitamin B-12 (CYANOCOBALAMIN) 500 MCG tablet, Take 500 mcg by mouth daily., Disp: , Rfl:  .  dexamethasone (DECADRON) 4 MG tablet, Take 2 tablets by mouth daily starting the day after Carboplatin and Cytoxan x 3 days. Take with food. (Patient not taking: Reported on 01/06/2020), Disp: 30 tablet, Rfl: 1 .  doxycycline (VIBRA-TABS) 100 MG tablet, Take 100 mg by mouth 2 (two) times daily., Disp: , Rfl:  .  HYDROcodone-acetaminophen (NORCO/VICODIN) 5-325 MG tablet, Take 1 tablet by mouth every 6 (six) hours as needed for moderate pain. (Patient not taking: Reported on 01/06/2020), Disp: 15 tablet, Rfl: 0 .  methocarbamol (ROBAXIN) 500 MG tablet, Take 1 tablet (500 mg total) by mouth every 6 (six) hours as needed for muscle spasms. (Patient not taking: Reported on 01/06/2020), Disp: 30 tablet, Rfl: 1 .  omeprazole (PRILOSEC) 20 MG capsule, Take 20 mg by mouth daily. , Disp: , Rfl:  .  sucralfate (CARAFATE) 1 g tablet, Take 1 g by mouth 3 (three) times daily., Disp: , Rfl:   Physical exam:  Vitals:   01/06/20 1032  BP: 128/63  Pulse: 79  Temp: (!) 97.4 F (36.3 C)  TempSrc: Tympanic  Weight: 150 lb (68 kg)   Physical Exam Constitutional:      General: She is not in acute distress. HENT:     Head: Normocephalic and atraumatic.  Eyes:     Pupils: Pupils are equal, round, and reactive to light.  Cardiovascular:     Rate and Rhythm: Normal rate and regular rhythm.     Heart sounds: Normal heart sounds.  Pulmonary:     Effort: Pulmonary effort is normal.     Breath sounds: Normal breath sounds.  Abdominal:     General: Bowel sounds are  normal.     Palpations: Abdomen is soft.  Musculoskeletal:     Cervical back: Normal range of motion.  Skin:    General: Skin is warm and dry.  Neurological:     Mental Status: She is alert and oriented to person, place, and time.      CMP Latest Ref Rng & Units 01/06/2020  Glucose 70 - 99 mg/dL 102(H)  BUN 8 - 23  mg/dL 14  Creatinine 0.44 - 1.00 mg/dL 0.64  Sodium 135 - 145 mmol/L 137  Potassium 3.5 - 5.1 mmol/L 3.6  Chloride 98 - 111 mmol/L 102  CO2 22 - 32 mmol/L 28  Calcium 8.9 - 10.3 mg/dL 8.8(L)  Total Protein 6.5 - 8.1 g/dL 6.3(L)  Total Bilirubin 0.3 - 1.2 mg/dL 0.5  Alkaline Phos 38 - 126 U/L 54  AST 15 - 41 U/L 21  ALT 0 - 44 U/L 27   CBC Latest Ref Rng & Units 01/06/2020  WBC 4.0 - 10.5 K/uL 6.0  Hemoglobin 12.0 - 15.0 g/dL 10.1(L)  Hematocrit 36.0 - 46.0 % 31.8(L)  Platelets 150 - 400 K/uL 239    Assessment and plan- Patient is a 64 y.o. female withclinical prognostic stage IIIb cT2 cN1 M0 triple negative invasive mammary carcinoma of the right breast.She is s/p 3 cycles of neoadjuvant AC Keytruda chemotherapy followed by bilateral mastectomy and targeted node dissection due tonosignificant response to neoadjuvant chemotherapy.She went on to get bilateral mastectomy and node dissection which showed stage IIIc disease PT 2 PN2 a..  She is here for on treatment assessment prior to cycle 4 of weekly Taxol chemotherapy  Counts okay to proceed with cycle 4 of weekly Taxol chemotherapy today.  Given her ongoing neuropathy I will dose reduce Taxol to 65 mg/m.  She will directly proceed for cycle 5 of weekly Taxol next week and I will see her back in 2 weeks for cycle 6  Chemotherapy-induced anemia: Stable continue to monitor  Sinus pain: She does not have any evidence of sinus tenderness.  No purulent nasal discharge which has been mostly clear.  I will therefore avoid giving her any empiric antibiotics for sinusitis.  Chemo-induced peripheral neuropathy: Taxol dose reduced as above.  Mild grade 1 continue to monitor   Visit Diagnosis 1. Malignant neoplasm of lower-outer quadrant of right breast of female, estrogen receptor negative ([place])   2. Encounter for antineoplastic chemotherapy   3. Antineoplastic chemotherapy induced anemia   4. Chemotherapy-induced peripheral neuropathy  ([place])      Dr. Randa Evens, MD, MPH [facility] [phone] 01/09/2020 8:39 AM

## 2020-01-12 ENCOUNTER — Other Ambulatory Visit: Payer: Self-pay

## 2020-01-13 ENCOUNTER — Other Ambulatory Visit: Payer: Self-pay | Admitting: Oncology

## 2020-01-13 ENCOUNTER — Inpatient Hospital Stay: Payer: PPO

## 2020-01-13 ENCOUNTER — Other Ambulatory Visit: Payer: Self-pay

## 2020-01-13 VITALS — BP 134/58 | HR 84 | Temp 96.7°F | Resp 18 | Wt 152.1 lb

## 2020-01-13 DIAGNOSIS — Z171 Estrogen receptor negative status [ER-]: Secondary | ICD-10-CM

## 2020-01-13 DIAGNOSIS — C50511 Malignant neoplasm of lower-outer quadrant of right female breast: Secondary | ICD-10-CM

## 2020-01-13 DIAGNOSIS — Z95828 Presence of other vascular implants and grafts: Secondary | ICD-10-CM

## 2020-01-13 DIAGNOSIS — Z5111 Encounter for antineoplastic chemotherapy: Secondary | ICD-10-CM | POA: Diagnosis not present

## 2020-01-13 LAB — COMPREHENSIVE METABOLIC PANEL
ALT: 24 U/L (ref 0–44)
AST: 19 U/L (ref 15–41)
Albumin: 3.6 g/dL (ref 3.5–5.0)
Alkaline Phosphatase: 56 U/L (ref 38–126)
Anion gap: 7 (ref 5–15)
BUN: 13 mg/dL (ref 8–23)
CO2: 29 mmol/L (ref 22–32)
Calcium: 9 mg/dL (ref 8.9–10.3)
Chloride: 104 mmol/L (ref 98–111)
Creatinine, Ser: 0.77 mg/dL (ref 0.44–1.00)
GFR calc Af Amer: >60 mL/min (ref >60)
GFR calc non Af Amer: >60 mL/min (ref >60)
Glucose, Bld: 122 mg/dL — ABNORMAL HIGH (ref 70–99)
Potassium: 3.5 mmol/L (ref 3.5–5.1)
Sodium: 140 mmol/L (ref 135–145)
Total Bilirubin: 0.3 mg/dL (ref 0.3–1.2)
Total Protein: 6.4 g/dL — ABNORMAL LOW (ref 6.5–8.1)

## 2020-01-13 LAB — CBC WITH DIFFERENTIAL/PLATELET
Abs Immature Granulocytes: 0.05 10*3/uL (ref 0.00–0.07)
Basophils Absolute: 0 10*3/uL (ref 0.0–0.1)
Basophils Relative: 1 %
Eosinophils Absolute: 0.2 10*3/uL (ref 0.0–0.5)
Eosinophils Relative: 3 %
HCT: 32.3 % — ABNORMAL LOW (ref 36.0–46.0)
Hemoglobin: 10.2 g/dL — ABNORMAL LOW (ref 12.0–15.0)
Immature Granulocytes: 1 %
Lymphocytes Relative: 10 %
Lymphs Abs: 0.7 10*3/uL (ref 0.7–4.0)
MCH: 27.6 pg (ref 26.0–34.0)
MCHC: 31.6 g/dL (ref 30.0–36.0)
MCV: 87.5 fL (ref 80.0–100.0)
Monocytes Absolute: 0.6 10*3/uL (ref 0.1–1.0)
Monocytes Relative: 9 %
Neutro Abs: 5.1 10*3/uL (ref 1.7–7.7)
Neutrophils Relative %: 76 %
Platelets: 230 10*3/uL (ref 150–400)
RBC: 3.69 MIL/uL — ABNORMAL LOW (ref 3.87–5.11)
RDW: 15 % (ref 11.5–15.5)
WBC: 6.7 10*3/uL (ref 4.0–10.5)
nRBC: 0 % (ref 0.0–0.2)

## 2020-01-13 MED ORDER — SODIUM CHLORIDE 0.9 % IV SOLN
65.0000 mg/m2 | Freq: Once | INTRAVENOUS | Status: AC
  Administered 2020-01-13: 114 mg via INTRAVENOUS
  Filled 2020-01-13: qty 19

## 2020-01-13 MED ORDER — DIPHENHYDRAMINE HCL 50 MG/ML IJ SOLN
50.0000 mg | Freq: Once | INTRAMUSCULAR | Status: AC
  Administered 2020-01-13: 50 mg via INTRAVENOUS
  Filled 2020-01-13: qty 1

## 2020-01-13 MED ORDER — HEPARIN SOD (PORK) LOCK FLUSH 100 UNIT/ML IV SOLN
500.0000 [IU] | Freq: Once | INTRAVENOUS | Status: AC | PRN
  Administered 2020-01-13: 500 [IU]
  Filled 2020-01-13: qty 5

## 2020-01-13 MED ORDER — PALONOSETRON HCL INJECTION 0.25 MG/5ML: 0.2500 mg | Freq: Once | INTRAVENOUS | Status: DC

## 2020-01-13 MED ORDER — SODIUM CHLORIDE 0.9 % IV SOLN
20.0000 mg | Freq: Once | INTRAVENOUS | Status: AC
  Administered 2020-01-13: 20 mg via INTRAVENOUS
  Filled 2020-01-13: qty 2

## 2020-01-13 MED ORDER — FAMOTIDINE IN NACL 20-0.9 MG/50ML-% IV SOLN
20.0000 mg | Freq: Once | INTRAVENOUS | Status: AC
  Administered 2020-01-13: 20 mg via INTRAVENOUS
  Filled 2020-01-13: qty 50

## 2020-01-13 MED ORDER — SODIUM CHLORIDE 0.9% FLUSH
10.0000 mL | Freq: Once | INTRAVENOUS | Status: AC
  Administered 2020-01-13: 10 mL via INTRAVENOUS
  Filled 2020-01-13: qty 10

## 2020-01-13 MED ORDER — SODIUM CHLORIDE 0.9 % IV SOLN
Freq: Once | INTRAVENOUS | Status: AC
  Filled 2020-01-13: qty 250

## 2020-01-13 NOTE — Progress Notes (Signed)
1335: pt states ever since she was switched to "protonix" last week her reflux has been bothering her day and night. Pt reports that the pain from the hernia has resolved but is asking what can she do for the reflux. She reports that she took 40 mg today instead of 20mg  of Protonix and that has helped but the "burning" sensation is still present after lunch. She has been taking TUMs to help also. Pt questions if she can see MD. Dr. Janese Banks aware. And Per Dr. Janese Banks, MD will see in infusion to discuss plan.   1400: Dr. Janese Banks at chairside to discuss symptoms and plan with pt.  Per Dr. Janese Banks no Aloxi needed with treatment.   1555: Pt educated to call clinic with any concerns. Pt denies any questions or concerns at this time. Pt stable at discharge.

## 2020-01-14 ENCOUNTER — Other Ambulatory Visit: Payer: Self-pay | Admitting: *Deleted

## 2020-01-14 DIAGNOSIS — H6123 Impacted cerumen, bilateral: Secondary | ICD-10-CM | POA: Diagnosis not present

## 2020-01-14 DIAGNOSIS — J310 Chronic rhinitis: Secondary | ICD-10-CM | POA: Diagnosis not present

## 2020-01-14 MED ORDER — PANTOPRAZOLE SODIUM 40 MG PO TBEC: 40.0000 mg | DELAYED_RELEASE_TABLET | Freq: Every day | ORAL | 1 refills | Status: DC

## 2020-01-20 ENCOUNTER — Inpatient Hospital Stay: Payer: PPO

## 2020-01-20 ENCOUNTER — Inpatient Hospital Stay (HOSPITAL_BASED_OUTPATIENT_CLINIC_OR_DEPARTMENT_OTHER): Payer: PPO | Admitting: Oncology

## 2020-01-20 ENCOUNTER — Other Ambulatory Visit: Payer: PPO

## 2020-01-20 ENCOUNTER — Other Ambulatory Visit: Payer: Self-pay

## 2020-01-20 VITALS — BP 128/61 | HR 84 | Temp 96.7°F | Wt 153.3 lb

## 2020-01-20 DIAGNOSIS — Z171 Estrogen receptor negative status [ER-]: Secondary | ICD-10-CM | POA: Diagnosis not present

## 2020-01-20 DIAGNOSIS — Z5111 Encounter for antineoplastic chemotherapy: Secondary | ICD-10-CM | POA: Diagnosis not present

## 2020-01-20 DIAGNOSIS — T451X5A Adverse effect of antineoplastic and immunosuppressive drugs, initial encounter: Secondary | ICD-10-CM

## 2020-01-20 DIAGNOSIS — C50511 Malignant neoplasm of lower-outer quadrant of right female breast: Secondary | ICD-10-CM

## 2020-01-20 DIAGNOSIS — G620 Drug-induced polyneuropathy: Secondary | ICD-10-CM | POA: Diagnosis not present

## 2020-01-20 DIAGNOSIS — D6481 Anemia due to antineoplastic chemotherapy: Secondary | ICD-10-CM | POA: Diagnosis not present

## 2020-01-20 DIAGNOSIS — Z95828 Presence of other vascular implants and grafts: Secondary | ICD-10-CM

## 2020-01-20 LAB — CBC WITH DIFFERENTIAL/PLATELET
Abs Immature Granulocytes: 0.06 10*3/uL (ref 0.00–0.07)
Basophils Absolute: 0 10*3/uL (ref 0.0–0.1)
Basophils Relative: 1 %
Eosinophils Absolute: 0.2 10*3/uL (ref 0.0–0.5)
Eosinophils Relative: 2 %
HCT: 31.8 % — ABNORMAL LOW (ref 36.0–46.0)
Hemoglobin: 10 g/dL — ABNORMAL LOW (ref 12.0–15.0)
Immature Granulocytes: 1 %
Lymphocytes Relative: 8 %
Lymphs Abs: 0.5 10*3/uL — ABNORMAL LOW (ref 0.7–4.0)
MCH: 27.5 pg (ref 26.0–34.0)
MCHC: 31.4 g/dL (ref 30.0–36.0)
MCV: 87.6 fL (ref 80.0–100.0)
Monocytes Absolute: 0.7 10*3/uL (ref 0.1–1.0)
Monocytes Relative: 10 %
Neutro Abs: 5.2 10*3/uL (ref 1.7–7.7)
Neutrophils Relative %: 78 %
Platelets: 223 10*3/uL (ref 150–400)
RBC: 3.63 MIL/uL — ABNORMAL LOW (ref 3.87–5.11)
RDW: 15.3 % (ref 11.5–15.5)
WBC: 6.6 10*3/uL (ref 4.0–10.5)
nRBC: 0 % (ref 0.0–0.2)

## 2020-01-20 LAB — COMPREHENSIVE METABOLIC PANEL
ALT: 19 U/L (ref 0–44)
AST: 20 U/L (ref 15–41)
Albumin: 3.4 g/dL — ABNORMAL LOW (ref 3.5–5.0)
Alkaline Phosphatase: 54 U/L (ref 38–126)
Anion gap: 8 (ref 5–15)
BUN: 11 mg/dL (ref 8–23)
CO2: 28 mmol/L (ref 22–32)
Calcium: 8.8 mg/dL — ABNORMAL LOW (ref 8.9–10.3)
Chloride: 103 mmol/L (ref 98–111)
Creatinine, Ser: 0.67 mg/dL (ref 0.44–1.00)
GFR calc Af Amer: >60 mL/min (ref >60)
GFR calc non Af Amer: >60 mL/min (ref >60)
Glucose, Bld: 118 mg/dL — ABNORMAL HIGH (ref 70–99)
Potassium: 3.8 mmol/L (ref 3.5–5.1)
Sodium: 139 mmol/L (ref 135–145)
Total Bilirubin: 0.4 mg/dL (ref 0.3–1.2)
Total Protein: 6.3 g/dL — ABNORMAL LOW (ref 6.5–8.1)

## 2020-01-20 MED ORDER — SODIUM CHLORIDE 0.9 % IV SOLN
65.0000 mg/m2 | Freq: Once | INTRAVENOUS | Status: AC
  Administered 2020-01-20: 114 mg via INTRAVENOUS
  Filled 2020-01-20: qty 19

## 2020-01-20 MED ORDER — HEPARIN SOD (PORK) LOCK FLUSH 100 UNIT/ML IV SOLN
INTRAVENOUS | Status: AC
  Filled 2020-01-20: qty 5

## 2020-01-20 MED ORDER — SODIUM CHLORIDE 0.9 % IV SOLN
20.0000 mg | Freq: Once | INTRAVENOUS | Status: AC
  Administered 2020-01-20: 20 mg via INTRAVENOUS
  Filled 2020-01-20: qty 20

## 2020-01-20 MED ORDER — DIPHENHYDRAMINE HCL 50 MG/ML IJ SOLN
50.0000 mg | Freq: Once | INTRAMUSCULAR | Status: AC
  Administered 2020-01-20: 50 mg via INTRAVENOUS
  Filled 2020-01-20: qty 1

## 2020-01-20 MED ORDER — SODIUM CHLORIDE 0.9 % IV SOLN
Freq: Once | INTRAVENOUS | Status: AC
  Filled 2020-01-20: qty 250

## 2020-01-20 MED ORDER — SODIUM CHLORIDE 0.9% FLUSH
10.0000 mL | Freq: Once | INTRAVENOUS | Status: AC
  Administered 2020-01-20: 10 mL via INTRAVENOUS
  Filled 2020-01-20: qty 10

## 2020-01-20 MED ORDER — FAMOTIDINE IN NACL 20-0.9 MG/50ML-% IV SOLN
20.0000 mg | Freq: Once | INTRAVENOUS | Status: AC
  Administered 2020-01-20: 20 mg via INTRAVENOUS
  Filled 2020-01-20: qty 50

## 2020-01-20 MED ORDER — PALONOSETRON HCL INJECTION 0.25 MG/5ML
0.2500 mg | Freq: Once | INTRAVENOUS | Status: AC
  Administered 2020-01-20: 0.25 mg via INTRAVENOUS
  Filled 2020-01-20: qty 5

## 2020-01-20 MED ORDER — HEPARIN SOD (PORK) LOCK FLUSH 100 UNIT/ML IV SOLN
500.0000 [IU] | Freq: Once | INTRAVENOUS | Status: AC | PRN
  Administered 2020-01-20: 500 [IU]
  Filled 2020-01-20: qty 5

## 2020-01-21 DIAGNOSIS — F330 Major depressive disorder, recurrent, mild: Secondary | ICD-10-CM | POA: Diagnosis not present

## 2020-01-21 DIAGNOSIS — F410 Panic disorder [episodic paroxysmal anxiety] without agoraphobia: Secondary | ICD-10-CM | POA: Diagnosis not present

## 2020-01-21 DIAGNOSIS — F411 Generalized anxiety disorder: Secondary | ICD-10-CM | POA: Diagnosis not present

## 2020-01-22 DIAGNOSIS — N3946 Mixed incontinence: Secondary | ICD-10-CM | POA: Diagnosis not present

## 2020-01-23 ENCOUNTER — Encounter: Payer: Self-pay | Admitting: Oncology

## 2020-01-23 ENCOUNTER — Other Ambulatory Visit: Payer: Self-pay | Admitting: Oncology

## 2020-01-23 NOTE — Progress Notes (Signed)
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 Tollie Pizza in the past and has undergone breast biopsies which did not previously showed malignancy. More recently patient noted some red discoloration around her right perioral as well as a palpable mass which led to a diagnostic mammogram on the right side her prior mammogram in February 2020 was normal in August 2020 she was noted to have a 2.7 x 2 x 2.3 cm mass in her right breast along with 4 morphologically abnormal lymph nodes. She underwent breast biopsy as well as lymph node biopsy which showed grade 3 invasive mammary carcinoma. ER PR and HER-2/neunegative.Sections show high-grade invasive carcinoma with focal squamous differentiation and pinpoint keratin ideation. The features are concerning for metaplastic differentiation.[name] Lymphovascular invasion present. There was extranodal extension present on the lymph node biopsy.   Her family history is significant for breast cancer in her mother in her 8s. Father had colon cancer in his70s.Family history also significant for ovarian cancer in her maternal aunt.  MRI of bilateral breasts showed: Primary right breast mass was 3.5 x 2.7 x 3.4 cm. There were surrounding numerous  nodules consistent with satellite lesions. Extensive nodular non-mass enhancement throughout the right breast involving the upper and lower quadrants spanning 6.2 x 4.5 x 5.5 cm. Markedly enhancement of the left breast diffusely. Non-mass enhancement measures 1.9 x 2.4 cm. At least 3 abnormal lymph nodes in the right axilla. No abnormal left axillary lymph nodes. 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 Equivocal inferior breast and left axillary nodal hypermetabolism. No evidence of extrathoracic hypermetabolic metastases. Lateral right breast primary with right axillary nodal metastases  Patient had 3 cycles of neoadjuvant AC chemotherapy along with Keytruda. There was no significant response in her tumor and patient proceeded with bilateral mastectomy with targeted node dissection. 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 7 lymph nodes were positive for malignancy with extranodal extension overall cancer cellularity was 40% [id number].Patient completed 1 cycle of adjuvant AC chemotherapy and will start weekly Taxol  Interval history-she reports that her neuropathy in her hands and feet is more or less the same.  Symptoms of heartburn are improved after increasing the dose of Protonix.  She also needs to take occasional Tums.  Sometimes feels like her chest wall is swollen after she takes off her bra at night.  ECOG PS- 1 Pain scale- 0   Review of systems- Review of Systems  Constitutional: Positive for malaise/fatigue. Negative for chills, fever and weight loss.  HENT: Negative for congestion, ear discharge and nosebleeds.   Eyes: Negative for blurred vision.  Respiratory: Negative for cough, hemoptysis, sputum production, shortness of breath and wheezing.   Cardiovascular: Negative for chest pain, palpitations, orthopnea and  claudication.  Gastrointestinal: Negative for abdominal pain, blood in stool, constipation, diarrhea, heartburn, melena, nausea and vomiting.  Genitourinary: Negative for dysuria, flank pain, frequency, hematuria and urgency.  Musculoskeletal: Negative for back pain, joint pain and myalgias.  Skin: Negative for rash.  Neurological: Positive for sensory change (Peripheral neuropathy). Negative for dizziness, tingling, focal weakness, seizures, weakness and headaches.  Endo/Heme/Allergies: Does not bruise/bleed easily.  Psychiatric/Behavioral: Negative for depression and suicidal ideas. The patient does not have insomnia.       Allergies  Allergen Reactions  . Amitiza [Lubiprostone] Other (See Comments)    Overly effective at higher dose  . Bentyl [Dicyclomine Hcl]     Lack of effect  . Celecoxib     REACTION: unspecified  . Cortisone Other (See Comments)    flush  . Mobic [Meloxicam]     Palpitations.  But can tolerate aleve  . Nitrofurantoin Monohyd Macro   . Sulfonamide Derivatives Nausea Only  . Zoloft [Sertraline Hcl] Diarrhea and Nausea Only  . Buspar [Buspirone] Palpitations  . Doxycycline Palpitations    chest pain  . Penicillins Rash     Past Medical History:  Diagnosis Date  . Anemia    d/t chemo - last Hgb 10.8  . Anxiety    sees Dr. [name]  . Breast cancer ([place]) 2020  . Cyst of breast    per [name]  . Dysrhythmia    Hx of palpitations  . Family history of breast cancer   . Family history of colon cancer   . Family history of leukemia   . Family history of ovarian cancer   . Fibromyalgia   . GERD (gastroesophageal reflux disease)   . Heart murmur   . Hemorrhoids   . Herpes, genital 04/2015   confirmed with HSV 2 IgG  . Hiatal hernia   . Hypercholesterolemia    Dr. Kyra Searles  . Hyperlipidemia   . IBS (irritable bowel syndrome)    constipation predominant  . IC (interstitial cystitis)    per [facility]  . Mild depression ([place])   . MVP  (mitral valve prolapse)    [facility] cards eval 2012  . Osteopenia    2010/2017, DEXA at [facility];spine and fem neck  . PONV (postoperative nausea and vomiting)   . Vitamin D deficiency    history of     Past Surgical History:  Procedure Laterality Date  . ABDOMINAL HYSTERECTOMY    . APPENDECTOMY    . BLADDER SURGERY     1980's  . BREAST EXCISIONAL BIOPSY Right    benign  . BREAST EXCISIONAL BIOPSY Bilateral    benign  . COLONOSCOPY  09/2014   Dr. [name]  . COLONOSCOPY  2009   at [facility] 1 POLYP (BENIGN)  . excision of breast cysts     hx of multiple cyst aspirations  . EXPLORATORY LAPAROTOMY    . MASTECTOMY MODIFIED RADICAL Bilateral 10/09/2019   Procedure: RIGHT MASTECTOMY MODIFIED RADICAL AND LEFT TOTAL MASTECTOMY;  Surgeon: Rolm Bookbinder, MD;  Location: [place];  Service: General;  Laterality: Bilateral;  . PORTA CATH INSERTION N/A 08/06/2019   Procedure: PORTA CATH INSERTION;  Surgeon: Katha Cabal, MD;  Location: [facility];  Service: Cardiovascular;  Laterality: N/A;  . PORTACATH PLACEMENT Left 08/05/2019   Procedure: INSERTION PORT-A-CATH, Attempted;  Surgeon: Jules Husbands, MD;  Location: [facility];  Service: General;  Laterality: Left;  . TUBAL LIGATION      Social History   Socioeconomic History  .  Marital status: Divorced    Spouse name: Not on file  . Number of children: 1  . Years of education: Not on file  . Highest education level: Not on file  Occupational History  . Occupation: [employer]  Tobacco Use  . Smoking status: Never Smoker  . Smokeless tobacco: Never Used  Substance and Sexual Activity  . Alcohol use: No    Alcohol/week: 0.0 standard drinks  . Drug use: No  . Sexual activity: Not Currently    Birth control/protection: Surgical    Comment: Hysterectomy  Other Topics Concern  . Not on file  Social History Narrative   Married in 01-07-97, divorced as of 07-Jan-2017, 1 son from previous relationship   Brother with MVA at 84, in rest home for  many years as of 07-Jan-2018- died of covid recently   Social Determinants of Radio broadcast assistant Strain:   . Difficulty of Paying Living Expenses: Not on file  Food Insecurity:   . Worried About Charity fundraiser in the Last Year: Not on file  . Ran Out of Food in the Last Year: Not on file  Transportation Needs:   . Lack of Transportation (Medical): Not on file  . Lack of Transportation (Non-Medical): Not on file  Physical Activity:   . Days of Exercise per Week: Not on file  . Minutes of Exercise per Session: Not on file  Stress:   . Feeling of Stress : Not on file  Social Connections:   . Frequency of Communication with Friends and Family: Not on file  . Frequency of Social Gatherings with Friends and Family: Not on file  . Attends Religious Services: Not on file  . Active Member of Clubs or Organizations: Not on file  . Attends Archivist Meetings: Not on file  . Marital Status: Not on file  Intimate Partner Violence:   . Fear of Current or Ex-Partner: Not on file  . Emotionally Abused: Not on file  . Physically Abused: Not on file  . Sexually Abused: Not on file    Family History  Problem Relation Age of Onset  . Breast cancer Mother 108  . Hypertension Mother   . Colon cancer Father 45  . Hyperlipidemia Father   . Ovarian cancer Maternal Aunt 80  . Breast cancer Cousin   . Gallbladder disease Paternal Grandmother   . Liver cancer Cousin 70       Malignant  . Cancer Maternal Uncle        unsure type     Current Outpatient Medications:  .  acetaminophen (TYLENOL) 650 MG CR tablet, Take 650 mg by mouth every 8 (eight) hours as needed for pain., Disp: , Rfl:  .  Ascorbic Acid (VITAMIN C) 100 MG tablet, Take 100 mg by mouth daily., Disp: , Rfl:  .  atorvastatin (LIPITOR) 40 MG tablet, Take 40 mg by mouth at bedtime. , Disp: , Rfl: 1 .  cholecalciferol (VITAMIN D3) 25 MCG (1000 UT) tablet, Take 1,000 Units by mouth daily. , Disp: , Rfl:  .  clonazePAM  (KLONOPIN) 0.5 MG tablet, Take 0.5 mg by mouth 2 (two) times daily. , Disp: , Rfl:  .  dexamethasone (DECADRON) 4 MG tablet, Take 2 tablets by mouth daily starting the day after Carboplatin and Cytoxan x 3 days. Take with food. (Patient not taking: Reported on 01/06/2020), Disp: 30 tablet, Rfl: 1 .  doxycycline (VIBRA-TABS) 100 MG tablet, Take 100 mg by mouth 2 (two)  times daily., Disp: , Rfl:  .  DULoxetine (CYMBALTA) 60 MG capsule, Take 60 mg by mouth daily. , Disp: , Rfl:  .  gabapentin (NEURONTIN) 100 MG capsule, Take 1-2 capsules (100-200 mg total) by mouth 3 (three) times daily as needed., Disp: 90 capsule, Rfl: 1 .  HYDROcodone-acetaminophen (NORCO/VICODIN) 5-325 MG tablet, Take 1 tablet by mouth every 6 (six) hours as needed for moderate pain. (Patient not taking: Reported on 01/06/2020), Disp: 15 tablet, Rfl: 0 .  lactulose (CHRONULAC) 10 GM/15ML solution, Take 20 g by mouth daily as needed. , Disp: , Rfl:  .  lidocaine-prilocaine (EMLA) cream, Apply to affected area once (Patient taking differently: Apply 1 application topically daily as needed [facility] access). ), Disp: 30 g, Rfl: 3 .  loratadine (CLARITIN) 10 MG tablet, Take 1 tablet (10 mg total) by mouth daily., Disp: , Rfl:  .  magic mouthwash w/lidocaine SOLN, Take 5 mLs by mouth 4 (four) times daily. 80 ml viscous lidocaine 2%, 80 ml Mylanta, 80 ml Diphenhydramine 12.5 mg/5 ml Elixir, 80 ml Nystatin 100,000 Unit suspension, 80 ml Prednisolone 15 mg/65m, 80 ml Distilled Water. Sig: Swish/Swallow 5-10 ml four times a day as needed. Dispense 480 ml. 3RFs, Disp: 480 mL, Rfl: 3 .  methocarbamol (ROBAXIN) 500 MG tablet, Take 1 tablet (500 mg total) by mouth every 6 (six) hours as needed for muscle spasms. (Patient not taking: Reported on 01/06/2020), Disp: 30 tablet, Rfl: 1 .  ondansetron (ZOFRAN) 8 MG tablet, Take 1 tablet (8 mg total) by mouth 2 (two) times daily as needed. Start on the third day after chemotherapy., Disp: 30 tablet, Rfl: 1 .   pantoprazole (PROTONIX) 40 MG tablet, Take 1 tablet (40 mg total) by mouth daily., Disp: 30 tablet, Rfl: 1 .  Plecanatide (TRULANCE) 3 MG TABS, Take 3 mg by mouth daily. , Disp: , Rfl:  .  prochlorperazine (COMPAZINE) 10 MG tablet, Take 1 tablet (10 mg total) by mouth every 6 (six) hours as needed (Nausea or vomiting)., Disp: 30 tablet, Rfl: 1 .  sennosides-docusate sodium (SENOKOT-S) 8.6-50 MG tablet, Take 1-2 tablets by mouth at bedtime. , Disp: , Rfl:  .  sucralfate (CARAFATE) 1 g tablet, Take 1 g by mouth 3 (three) times daily., Disp: , Rfl:  .  valACYclovir (VALTREX) 500 MG tablet, Take 500 mg by mouth daily., Disp: , Rfl:  .  vitamin B-12 (CYANOCOBALAMIN) 500 MCG tablet, Take 500 mcg by mouth daily., Disp: , Rfl:   Physical exam:  Vitals:   01/20/20 1013  BP: 128/61  Pulse: 84  Temp: (!) 96.7 F (35.9 C)  TempSrc: Tympanic  Weight: 153 lb 4.8 oz (69.5 kg)   Physical Exam Constitutional:      General: She is not in acute distress. HENT:     Head: Normocephalic and atraumatic.  Eyes:     Pupils: Pupils are equal, round, and reactive to light.  Cardiovascular:     Rate and Rhythm: Normal rate and regular rhythm.     Heart sounds: Normal heart sounds.  Pulmonary:     Effort: Pulmonary effort is normal.     Breath sounds: Normal breath sounds.  Abdominal:     General: Bowel sounds are normal.     Palpations: Abdomen is soft.  Musculoskeletal:     Cervical back: Normal range of motion.  Skin:    General: Skin is warm and dry.  Neurological:     Mental Status: She is alert and oriented to person,  place, and time.   Patient is s/p bilateral mastectomy with well-healed surgical scar.  No concern for chest wall recurrence.  No palpable masses or nodules.  CMP Latest Ref Rng & Units 01/20/2020  Glucose 70 - 99 mg/dL 118(H)  BUN 8 - 23 mg/dL 11  Creatinine 0.44 - 1.00 mg/dL 0.67  Sodium 135 - 145 mmol/L 139  Potassium 3.5 - 5.1 mmol/L 3.8  Chloride 98 - 111 mmol/L 103  CO2  22 - 32 mmol/L 28  Calcium 8.9 - 10.3 mg/dL 8.8(L)  Total Protein 6.5 - 8.1 g/dL 6.3(L)  Total Bilirubin 0.3 - 1.2 mg/dL 0.4  Alkaline Phos 38 - 126 U/L 54  AST 15 - 41 U/L 20  ALT 0 - 44 U/L 19   CBC Latest Ref Rng & Units 01/20/2020  WBC 4.0 - 10.5 K/uL 6.6  Hemoglobin 12.0 - 15.0 g/dL 10.0(L)  Hematocrit 36.0 - 46.0 % 31.8(L)  Platelets 150 - 400 K/uL 223     Assessment and plan- Patient is a 64 y.o. female withclinical prognostic stage IIIb cT2 cN1 M0 triple negative invasive mammary carcinoma of the right breast.She is s/p 3 cycles of neoadjuvant AC Keytruda chemotherapy followed by bilateral mastectomy and targeted node dissection due tonosignificant response to neoadjuvant chemotherapy.She went on to get bilateral mastectomy and node dissection which showed stage IIIc disease PT 2 PN2 a..   She is here for on treatment assessment prior to cycle 6 of weekly Taxol chemotherapy  Counts okay to proceed with cycle 6 of weekly Taxol chemotherapy today.  I will see her back in 2 weeks for cycle 8 and she will directly proceed for cycle 7.  Chemo-induced anemia: Stable around 10 continue to monitor  Chemo-induced peripheral neuropathy: Stable grade 1 continue to monitor  Sinus pain: She has seen ENT and is currently on nasal spray.  History of heartburn: Currently on Protonix.  Continue the same   Visit Diagnosis 1. Encounter for antineoplastic chemotherapy   2. Chemotherapy-induced peripheral neuropathy ([place])   3. Malignant neoplasm of lower-outer quadrant of right breast of female, estrogen receptor negative ([place])   4. Antineoplastic chemotherapy induced anemia      Dr. Randa Evens, MD, MPH [facility] at [facility] [phone] 01/23/2020 8:23 AM

## 2020-01-27 ENCOUNTER — Inpatient Hospital Stay: Payer: PPO

## 2020-01-27 ENCOUNTER — Other Ambulatory Visit: Payer: Self-pay

## 2020-01-27 ENCOUNTER — Encounter: Payer: Self-pay | Admitting: *Deleted

## 2020-01-27 ENCOUNTER — Other Ambulatory Visit: Payer: Self-pay | Admitting: Oncology

## 2020-01-27 ENCOUNTER — Inpatient Hospital Stay: Payer: PPO | Attending: Oncology

## 2020-01-27 VITALS — BP 119/66 | HR 86 | Temp 97.4°F | Resp 18 | Wt 153.6 lb

## 2020-01-27 DIAGNOSIS — Z006 Encounter for examination for normal comparison and control in clinical research program: Secondary | ICD-10-CM | POA: Insufficient documentation

## 2020-01-27 DIAGNOSIS — Z800 Family history of malignant neoplasm of digestive organs: Secondary | ICD-10-CM | POA: Insufficient documentation

## 2020-01-27 DIAGNOSIS — Z806 Family history of leukemia: Secondary | ICD-10-CM | POA: Insufficient documentation

## 2020-01-27 DIAGNOSIS — Z9013 Acquired absence of bilateral breasts and nipples: Secondary | ICD-10-CM | POA: Insufficient documentation

## 2020-01-27 DIAGNOSIS — Z95828 Presence of other vascular implants and grafts: Secondary | ICD-10-CM

## 2020-01-27 DIAGNOSIS — Z171 Estrogen receptor negative status [ER-]: Secondary | ICD-10-CM

## 2020-01-27 DIAGNOSIS — T451X5A Adverse effect of antineoplastic and immunosuppressive drugs, initial encounter: Secondary | ICD-10-CM | POA: Insufficient documentation

## 2020-01-27 DIAGNOSIS — G620 Drug-induced polyneuropathy: Secondary | ICD-10-CM | POA: Insufficient documentation

## 2020-01-27 DIAGNOSIS — Z8041 Family history of malignant neoplasm of ovary: Secondary | ICD-10-CM | POA: Insufficient documentation

## 2020-01-27 DIAGNOSIS — D0512 Intraductal carcinoma in situ of left breast: Secondary | ICD-10-CM | POA: Insufficient documentation

## 2020-01-27 DIAGNOSIS — Z170 Estrogen receptor positive status [ER+]: Secondary | ICD-10-CM | POA: Insufficient documentation

## 2020-01-27 DIAGNOSIS — C50511 Malignant neoplasm of lower-outer quadrant of right female breast: Secondary | ICD-10-CM | POA: Insufficient documentation

## 2020-01-27 DIAGNOSIS — Z5111 Encounter for antineoplastic chemotherapy: Secondary | ICD-10-CM | POA: Diagnosis present

## 2020-01-27 DIAGNOSIS — Z79899 Other long term (current) drug therapy: Secondary | ICD-10-CM | POA: Insufficient documentation

## 2020-01-27 DIAGNOSIS — E785 Hyperlipidemia, unspecified: Secondary | ICD-10-CM | POA: Insufficient documentation

## 2020-01-27 DIAGNOSIS — M8580 Other specified disorders of bone density and structure, unspecified site: Secondary | ICD-10-CM | POA: Insufficient documentation

## 2020-01-27 LAB — COMPREHENSIVE METABOLIC PANEL
ALT: 21 U/L (ref 0–44)
AST: 22 U/L (ref 15–41)
Albumin: 3.4 g/dL — ABNORMAL LOW (ref 3.5–5.0)
Alkaline Phosphatase: 49 U/L (ref 38–126)
Anion gap: 9 (ref 5–15)
BUN: 15 mg/dL (ref 8–23)
CO2: 27 mmol/L (ref 22–32)
Calcium: 8.7 mg/dL — ABNORMAL LOW (ref 8.9–10.3)
Chloride: 102 mmol/L (ref 98–111)
Creatinine, Ser: 0.82 mg/dL (ref 0.44–1.00)
GFR calc Af Amer: >60 mL/min (ref >60)
GFR calc non Af Amer: >60 mL/min (ref >60)
Glucose, Bld: 129 mg/dL — ABNORMAL HIGH (ref 70–99)
Potassium: 3.6 mmol/L (ref 3.5–5.1)
Sodium: 138 mmol/L (ref 135–145)
Total Bilirubin: 0.4 mg/dL (ref 0.3–1.2)
Total Protein: 6.2 g/dL — ABNORMAL LOW (ref 6.5–8.1)

## 2020-01-27 LAB — CBC WITH DIFFERENTIAL/PLATELET
Abs Immature Granulocytes: 0.05 10*3/uL (ref 0.00–0.07)
Basophils Absolute: 0 10*3/uL (ref 0.0–0.1)
Basophils Relative: 0 %
Eosinophils Absolute: 0.2 10*3/uL (ref 0.0–0.5)
Eosinophils Relative: 3 %
HCT: 32.7 % — ABNORMAL LOW (ref 36.0–46.0)
Hemoglobin: 10.2 g/dL — ABNORMAL LOW (ref 12.0–15.0)
Immature Granulocytes: 1 %
Lymphocytes Relative: 8 %
Lymphs Abs: 0.5 10*3/uL — ABNORMAL LOW (ref 0.7–4.0)
MCH: 27.3 pg (ref 26.0–34.0)
MCHC: 31.2 g/dL (ref 30.0–36.0)
MCV: 87.4 fL (ref 80.0–100.0)
Monocytes Absolute: 0.6 10*3/uL (ref 0.1–1.0)
Monocytes Relative: 9 %
Neutro Abs: 5.5 10*3/uL (ref 1.7–7.7)
Neutrophils Relative %: 79 %
Platelets: 250 10*3/uL (ref 150–400)
RBC: 3.74 MIL/uL — ABNORMAL LOW (ref 3.87–5.11)
RDW: 15.6 % — ABNORMAL HIGH (ref 11.5–15.5)
WBC: 7 10*3/uL (ref 4.0–10.5)
nRBC: 0 % (ref 0.0–0.2)

## 2020-01-27 MED ORDER — HEPARIN SOD (PORK) LOCK FLUSH 100 UNIT/ML IV SOLN
500.0000 [IU] | Freq: Once | INTRAVENOUS | Status: AC | PRN
  Administered 2020-01-27: 500 [IU]
  Filled 2020-01-27: qty 5

## 2020-01-27 MED ORDER — FAMOTIDINE IN NACL 20-0.9 MG/50ML-% IV SOLN
20.0000 mg | Freq: Once | INTRAVENOUS | Status: AC
  Administered 2020-01-27: 20 mg via INTRAVENOUS
  Filled 2020-01-27: qty 50

## 2020-01-27 MED ORDER — SODIUM CHLORIDE 0.9 % IV SOLN
20.0000 mg | Freq: Once | INTRAVENOUS | Status: AC
  Administered 2020-01-27: 20 mg via INTRAVENOUS
  Filled 2020-01-27: qty 2

## 2020-01-27 MED ORDER — SODIUM CHLORIDE 0.9 % IV SOLN
65.0000 mg/m2 | Freq: Once | INTRAVENOUS | Status: AC
  Administered 2020-01-27: 114 mg via INTRAVENOUS
  Filled 2020-01-27: qty 19

## 2020-01-27 MED ORDER — SODIUM CHLORIDE 0.9% FLUSH
10.0000 mL | Freq: Once | INTRAVENOUS | Status: AC
  Administered 2020-01-27: 10 mL via INTRAVENOUS
  Filled 2020-01-27: qty 10

## 2020-01-27 MED ORDER — HEPARIN SOD (PORK) LOCK FLUSH 100 UNIT/ML IV SOLN
INTRAVENOUS | Status: AC
  Filled 2020-01-27: qty 5

## 2020-01-27 MED ORDER — SODIUM CHLORIDE 0.9 % IV SOLN
Freq: Once | INTRAVENOUS | Status: AC
  Filled 2020-01-27: qty 250

## 2020-01-27 MED ORDER — PALONOSETRON HCL INJECTION 0.25 MG/5ML
0.2500 mg | Freq: Once | INTRAVENOUS | Status: AC
  Administered 2020-01-27: 0.25 mg via INTRAVENOUS
  Filled 2020-01-27: qty 5

## 2020-01-27 MED ORDER — DIPHENHYDRAMINE HCL 50 MG/ML IJ SOLN
50.0000 mg | Freq: Once | INTRAMUSCULAR | Status: AC
  Administered 2020-01-27: 50 mg via INTRAVENOUS
  Filled 2020-01-27: qty 1

## 2020-01-27 NOTE — Research (Addendum)
Patient Debra Little returns to clinic as scheduled this morning for her Taxol infusion. Patient was not examined by Dr. Janese Banks this morning, but had her H&P last week. Dr. Janese Banks did complete the week 4 Physician Treatment Burden scale. Her week 4 S1714 Neuropathy Assessment was completed by this RN assisted by Lula Olszewski. Patient is right side dominant, so the neuropen and tuning fork assessments were completed on the right lower and upper extremity. [name] also completed all of the week 4 study questionnaires including EORTC, CIPN20, PROMIS-29, GSLTPAQ and Patient Reported Symptom Burden.  [name] denies having experienced any recent falls, but does report she feels "a little wobbly in her legs" for 2-3 days after receiving her Taxol infusion. [name] lives alone and states that although she is experiencing some increased neuropathy symptoms, she is able to manage all of her ADLs without any assistance. She describes some difficulty standing up from the toilet as the most bothersome symptom, but is able to perform this activity - just slower than usual. States she is very careful and takes her time when in the shower in effort to avoid a fall. Solicited CTCAE Assessment as noted below with grade and attribution verified with Dr. Janese Banks. Patient describes sensory neuropathy in left foot that is painful and affects her balance at times, but this has not increased from baseline. She also reports transient numbness and tingling in her fingertips, feet and toes - worse in feet and toes than in fingers, but states this is only mildly worse since initiating Taxol infusions. She can tell the most difference in the 2-3 days following her Taxol infusions. [name] reports a long history of Fibromyalgia and has been treated with gabapentin since 05/18/2011.  Study/Protocol: SWOG [id number] Cycle: 4 week Event Grade Onset Date Resolved Date Drug Name Attribution Treatment Comments  Dysesthesia 1 05/18/2011    unrelated gabapentin Reported at baseline  Neuralgia 0        Paresthesia 1 05/18/2011   unrelated gabapentin Reported at baseline  Peripheral motor neuropathy 0        Peripheral sensory neuropathy 1 05/18/2011   possibly gabapentin Reported at baseline, mildly increased with paclitaxel  Yolande Jolly, BSN, MHA, OCN 01/27/2020 9:36 AM

## 2020-01-28 ENCOUNTER — Other Ambulatory Visit: Payer: Self-pay

## 2020-01-28 ENCOUNTER — Inpatient Hospital Stay: Payer: PPO | Attending: Oncology | Admitting: Occupational Therapy

## 2020-01-28 DIAGNOSIS — L905 Scar conditions and fibrosis of skin: Secondary | ICD-10-CM

## 2020-01-28 DIAGNOSIS — C50911 Malignant neoplasm of unspecified site of right female breast: Secondary | ICD-10-CM | POA: Insufficient documentation

## 2020-01-28 NOTE — Therapy (Signed)
[facility] [address] [place], [place], [place], [address] Phone: [phone]   Fax:  [phone]  Occupational Therapy Screen  Patient Details  Name: Debra Little MRN: [id number] Date of Birth: Aug 09, 1956 No data recorded  Encounter Date: 01/28/2020  OT End of Session - 01/28/20 1250    Visit Number  0       Past Medical History:  Diagnosis Date  . Anemia    d/t chemo - last Hgb 10.8  . Anxiety    sees Dr. [name]  . Breast cancer ([place]) 2020  . Cyst of breast    per [name]  . Dysrhythmia    Hx of palpitations  . Family history of breast cancer   . Family history of colon cancer   . Family history of leukemia   . Family history of ovarian cancer   . Fibromyalgia   . GERD (gastroesophageal reflux disease)   . Heart murmur   . Hemorrhoids   . Herpes, genital 04/2015   confirmed with HSV 2 IgG  . Hiatal hernia   . Hypercholesterolemia    Dr. Kyra Searles  . Hyperlipidemia   . IBS (irritable bowel syndrome)    constipation predominant  . IC (interstitial cystitis)    per [facility]  . Mild depression ([place])   . MVP (mitral valve prolapse)    [facility] cards eval 2012  . Osteopenia    2010/2017, DEXA at [facility];spine and fem neck  . PONV (postoperative nausea and vomiting)   . Vitamin D deficiency    history of    Past Surgical History:  Procedure Laterality Date  . ABDOMINAL HYSTERECTOMY    . APPENDECTOMY    . BLADDER SURGERY     1980's  . BREAST EXCISIONAL BIOPSY Right    benign  . BREAST EXCISIONAL BIOPSY Bilateral    benign  . COLONOSCOPY  09/2014   Dr. [name]  . COLONOSCOPY  2009   at [facility] 1 POLYP (BENIGN)  . excision of breast cysts     hx of multiple cyst aspirations  . EXPLORATORY LAPAROTOMY    . MASTECTOMY MODIFIED RADICAL Bilateral 10/09/2019   Procedure: RIGHT MASTECTOMY MODIFIED RADICAL AND LEFT TOTAL MASTECTOMY;  Surgeon: Rolm Bookbinder, MD;  Location: [facility];  Service:  General;  Laterality: Bilateral;  . PORTA CATH INSERTION N/A 08/06/2019   Procedure: PORTA CATH INSERTION;  Surgeon: Katha Cabal, MD;  Location: [facility];  Service: Cardiovascular;  Laterality: N/A;  . PORTACATH PLACEMENT Left 08/05/2019   Procedure: INSERTION PORT-A-CATH, Attempted;  Surgeon: Jules Husbands, MD;  Location: [facility];  Service: General;  Laterality: Left;  . TUBAL LIGATION      There were no vitals filed for this visit.  Subjective Assessment - 01/28/20 1249    Subjective   I feel like the week or 2 after my chemo I am more swollen - just all over - my arm I can see - and then I am eating more and gain about 5 lbs          LYMPHEDEMA/ONCOLOGY QUESTIONNAIRE - 01/28/20 1125      Right Upper Extremity Lymphedema   15 cm Proximal to Olecranon Process  28.7 cm    10 cm Proximal to Olecranon Process  26.5 cm    Olecranon Process  22.5 cm    15 cm Proximal to Ulnar Styloid Process  22 cm    10 cm Proximal to Ulnar  Styloid Process  19.4 cm    Just Proximal to Ulnar Styloid Process  15 cm    Across Hand at [employer]  18.4 cm    At [place] of 2nd Digit  6 cm    At [facility]  5.7 cm      Left Upper Extremity Lymphedema   15 cm Proximal to Olecranon Process  27.5 cm    10 cm Proximal to Olecranon Process  25.5 cm    Olecranon Process  22.5 cm    15 cm Proximal to Ulnar Styloid Process  21 cm    10 cm Proximal to Ulnar Styloid Process  19 cm    Just Proximal to Ulnar Styloid Process  14.8 cm    Across Hand at [employer]  16.5 cm    At [place] of 2nd Digit  5.5 cm    At [facility]  5.8 cm          NOTE FROM 11/11/2019  Pt refer by Dr Janese Banks for OT to screen for education on lymphedema and ROM Post mastectomy. 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 The metaplastic pathology particularly make this aggressive. I would like to complete her adjuvant chemotherapy at this time and give her 1 cycle of AC chemotherapy followed by 12 cycles of weekly Taxol.  I will check PD-L1 status on her final pathology. If she has significant PD-L1 expression it would make sense to continue Keytruda which she has been approved for compassionate use through the drug company. However if she does not have any significant PD-L1 expression I will plan to drop her Keytruda.Treatment will be given with a curative intent  Patient is still healing from her mastectomy wound and I will give her about 3 weeks to see if she has recovered and ready to start chemo  Upon completion of chemotherapy I will refer her to radiation oncology to discuss adjuvant radiation treatment  OT note12/15/2020: Pt complain about pain this am at hernia - she took some pain meds and than helped - she will call her GI MD. Pt is about 5 wks out from bilateral mastectomy with 9 axillary ln removed on R per pt. Scar healing very well on bilateral chest - but R lateral part still draining per pt after having infection and has bandage on- she do have a velcro tube compression garment on - Done with her antibiotic for about week per pt and next appt with Dr Donne Hazel is 30th Dec  Pt show decrease Shoulder AROM over head and external rotation - pt provide with some HEP for those to do and keep pain under 2/10  Pt ed on lymphedema and hand out provided - circumference WNL - pt is R hand dominant Will check on pt again in month    OT NOTE 12/03/2019: Pt arrive with incision healed and starting chemo yesterday back again.  Incision healed but scar adhesions worse on R chest and axilla limiting her shoulder over head  and external rotation - pt to do scar mobs inarmover head position - and  Cont with AAROM on wall for shoulder flexion , external Scapula squeezes few times during day  Her L upper arm circumference to elbow increase compare to 3 wks ago and recommend for pt to get over the counter compression sleeve and glove - she moved this past weekend and was picking upr things per pt she should have not - but did not had a lot of help  Also would recommend unilateral post mastectomy jovipak breast pad to use during day - pt report some swelling under scars - but could be too because of scars adhesions Want to be preventative prior to radiation in future too   Pt to call me when she get compression garments  NOTE FROM 12/31/2019: Pt arrive with her  unilateral post mastectomy jovipak breast pad on under her bra - looks good fit - review wearing it when she done a lot of heavy lifting or repetition and feels her chest or under arm is swollen - to wear it  Want to be preventative prior to radiation in future too  Her L upper arm circumference to elbow increase last time compare to the 3 wks before than - she arrive with her [place] strong  compression sleeve  - circumference decrease in R UE -and pt to only wear it now with high risk activities - do not have to wear glove -except if swelling in the hand     Incision healed but scar  Tight under arm and axilla on R limiting her shoulder over head and external rotation end range  - pt to do scar mobs inarmover head position - and  Cont with AAROM on wall for shoulder flexion , external Scapula squeezes few times during day    Pt to follow up one more time in month   Note for today(01/28/2020):  Pt seen this date for follow up - she report wearing her sleeve  longer periods if she things she going to use it a lot- remind pt to only wear with high risk activities - if going to wear longer periods she needs to wear glove too.   Her measurements did increase on R UE - but she also increased on L UE - and had weight gain of about 5 lbs.  She is still increase about 1 cm compare to L -but she is R hand dominant.  She report increase swelling all over  the few days or week after chemo but then decreases- pt to cont with jovipak breast pad as needed. And AROM in shoulder WNL and scar tissue great - and she is using her R UE normally. Pt to wear over the counter sleeve only with hight risk activities. Will reassess in 6 wks when chemo is finish and around time she is starting radiation.                                          Visit Diagnosis: Scar condition and fibrosis of skin    Problem List Patient Active Problem List   Diagnosis Date Noted  . Bilateral breast cancer ([place]) 10/09/2019  . Genetic testing 08/27/2019  . Family history of breast cancer   . Family history of ovarian cancer   . Family history of colon cancer   . Family history of leukemia   .  Goals of care, counseling/discussion 08/07/2019  . Breast cancer ([place]) 07/30/2019  . GERD (gastroesophageal reflux disease) 06/30/2019  . Abdominal discomfort 05/15/2019  . Advance care planning 07/14/2018  . Health care maintenance 07/14/2018  . Depression, recurrent ([place]) 07/14/2018  . Constipation 07/14/2018  . Altered taste 07/14/2018  . Fibrocystic breast changes of both breasts 01/24/2018  . Vasomotor symptoms due to menopause 09/05/2017  . Herpes simplex vulvovaginitis 09/05/2017  . Skin nodule 05/25/2017  . Encounter for screening examination for infectious disease 03/18/2017  . Lipoma 03/18/2017  . Vaginitis 03/18/2017  . Radicular pain in left arm 07/27/2015  . Neuralgia and neuritis, unspecified 07/27/2015  . Fatigue 03/05/2015  . B12  deficiency 03/05/2015  . Skin rash 05/01/2014  . MVP (mitral valve prolapse) 04/30/2014  . Cyst of breast 04/05/2014  . Interstitial cystitis 03/31/2014  . Routine general medical examination at a health care facility 11/13/2011  . Anxiety and depression 10/06/2010  . BREAST CYSTS, BILATERAL 12/03/2008  . PALPITATIONS, CHRONIC 12/03/2008  . UNSPECIFIED VITAMIN D DEFICIENCY 09/10/2008  . HLD (hyperlipidemia) 09/10/2008  . IRRITABLE BOWEL SYNDROME 02/12/2008    Rosalyn Gess OTR/L,CLT 01/28/2020, 12:51 PM  [facility] [address] [place], [place], [place], [address] Phone: [phone]   Fax:  [phone]  Name: SHENIA ALAN MRN: [id number] Date of Birth: [birth year]

## 2020-01-29 DIAGNOSIS — C50919 Malignant neoplasm of unspecified site of unspecified female breast: Secondary | ICD-10-CM | POA: Diagnosis not present

## 2020-01-29 DIAGNOSIS — J340 Abscess, furuncle and carbuncle of nose: Secondary | ICD-10-CM | POA: Diagnosis not present

## 2020-02-02 ENCOUNTER — Encounter: Payer: Self-pay | Admitting: Oncology

## 2020-02-02 ENCOUNTER — Other Ambulatory Visit: Payer: Self-pay

## 2020-02-02 NOTE — Progress Notes (Signed)
Patient stated that she had been having trouble sleeping at nighttime.

## 2020-02-03 ENCOUNTER — Inpatient Hospital Stay: Payer: PPO

## 2020-02-03 ENCOUNTER — Other Ambulatory Visit: Payer: Self-pay

## 2020-02-03 ENCOUNTER — Inpatient Hospital Stay (HOSPITAL_BASED_OUTPATIENT_CLINIC_OR_DEPARTMENT_OTHER): Payer: PPO | Admitting: Oncology

## 2020-02-03 VITALS — BP 120/65 | HR 80 | Temp 97.8°F | Wt 150.0 lb

## 2020-02-03 VITALS — Resp 18

## 2020-02-03 DIAGNOSIS — Z171 Estrogen receptor negative status [ER-]: Secondary | ICD-10-CM

## 2020-02-03 DIAGNOSIS — Z5111 Encounter for antineoplastic chemotherapy: Secondary | ICD-10-CM

## 2020-02-03 DIAGNOSIS — Z95828 Presence of other vascular implants and grafts: Secondary | ICD-10-CM

## 2020-02-03 DIAGNOSIS — D6481 Anemia due to antineoplastic chemotherapy: Secondary | ICD-10-CM | POA: Diagnosis not present

## 2020-02-03 DIAGNOSIS — G620 Drug-induced polyneuropathy: Secondary | ICD-10-CM

## 2020-02-03 DIAGNOSIS — Z006 Encounter for examination for normal comparison and control in clinical research program: Secondary | ICD-10-CM | POA: Diagnosis present

## 2020-02-03 DIAGNOSIS — Z806 Family history of leukemia: Secondary | ICD-10-CM | POA: Diagnosis not present

## 2020-02-03 DIAGNOSIS — Z800 Family history of malignant neoplasm of digestive organs: Secondary | ICD-10-CM | POA: Diagnosis not present

## 2020-02-03 DIAGNOSIS — Z8041 Family history of malignant neoplasm of ovary: Secondary | ICD-10-CM | POA: Diagnosis not present

## 2020-02-03 DIAGNOSIS — D0512 Intraductal carcinoma in situ of left breast: Secondary | ICD-10-CM | POA: Diagnosis not present

## 2020-02-03 DIAGNOSIS — C50511 Malignant neoplasm of lower-outer quadrant of right female breast: Secondary | ICD-10-CM

## 2020-02-03 DIAGNOSIS — T451X5A Adverse effect of antineoplastic and immunosuppressive drugs, initial encounter: Secondary | ICD-10-CM

## 2020-02-03 DIAGNOSIS — Z9013 Acquired absence of bilateral breasts and nipples: Secondary | ICD-10-CM | POA: Diagnosis not present

## 2020-02-03 DIAGNOSIS — Z170 Estrogen receptor positive status [ER+]: Secondary | ICD-10-CM | POA: Diagnosis not present

## 2020-02-03 DIAGNOSIS — M8580 Other specified disorders of bone density and structure, unspecified site: Secondary | ICD-10-CM | POA: Diagnosis not present

## 2020-02-03 DIAGNOSIS — E785 Hyperlipidemia, unspecified: Secondary | ICD-10-CM | POA: Diagnosis not present

## 2020-02-03 DIAGNOSIS — Z79899 Other long term (current) drug therapy: Secondary | ICD-10-CM | POA: Diagnosis not present

## 2020-02-03 LAB — CBC WITH DIFFERENTIAL/PLATELET
Abs Immature Granulocytes: 0.03 10*3/uL (ref 0.00–0.07)
Basophils Absolute: 0 10*3/uL (ref 0.0–0.1)
Basophils Relative: 1 %
Eosinophils Absolute: 0.2 10*3/uL (ref 0.0–0.5)
Eosinophils Relative: 3 %
HCT: 33.2 % — ABNORMAL LOW (ref 36.0–46.0)
Hemoglobin: 10.4 g/dL — ABNORMAL LOW (ref 12.0–15.0)
Immature Granulocytes: 1 %
Lymphocytes Relative: 9 %
Lymphs Abs: 0.6 10*3/uL — ABNORMAL LOW (ref 0.7–4.0)
MCH: 27.5 pg (ref 26.0–34.0)
MCHC: 31.3 g/dL (ref 30.0–36.0)
MCV: 87.8 fL (ref 80.0–100.0)
Monocytes Absolute: 0.6 10*3/uL (ref 0.1–1.0)
Monocytes Relative: 9 %
Neutro Abs: 4.9 10*3/uL (ref 1.7–7.7)
Neutrophils Relative %: 77 %
Platelets: 258 10*3/uL (ref 150–400)
RBC: 3.78 MIL/uL — ABNORMAL LOW (ref 3.87–5.11)
RDW: 16 % — ABNORMAL HIGH (ref 11.5–15.5)
WBC: 6.3 10*3/uL (ref 4.0–10.5)
nRBC: 0 % (ref 0.0–0.2)

## 2020-02-03 LAB — COMPREHENSIVE METABOLIC PANEL
ALT: 17 U/L (ref 0–44)
AST: 19 U/L (ref 15–41)
Albumin: 3.5 g/dL (ref 3.5–5.0)
Alkaline Phosphatase: 52 U/L (ref 38–126)
Anion gap: 9 (ref 5–15)
BUN: 14 mg/dL (ref 8–23)
CO2: 25 mmol/L (ref 22–32)
Calcium: 8.7 mg/dL — ABNORMAL LOW (ref 8.9–10.3)
Chloride: 102 mmol/L (ref 98–111)
Creatinine, Ser: 0.57 mg/dL (ref 0.44–1.00)
GFR calc Af Amer: >60 mL/min (ref >60)
GFR calc non Af Amer: >60 mL/min (ref >60)
Glucose, Bld: 101 mg/dL — ABNORMAL HIGH (ref 70–99)
Potassium: 3.7 mmol/L (ref 3.5–5.1)
Sodium: 136 mmol/L (ref 135–145)
Total Bilirubin: 0.4 mg/dL (ref 0.3–1.2)
Total Protein: 6.4 g/dL — ABNORMAL LOW (ref 6.5–8.1)

## 2020-02-03 MED ORDER — SODIUM CHLORIDE 0.9 % IV SOLN
20.0000 mg | Freq: Once | INTRAVENOUS | Status: AC
  Administered 2020-02-03: 20 mg via INTRAVENOUS
  Filled 2020-02-03: qty 20

## 2020-02-03 MED ORDER — SODIUM CHLORIDE 0.9 % IV SOLN
65.0000 mg/m2 | Freq: Once | INTRAVENOUS | Status: AC
  Administered 2020-02-03: 114 mg via INTRAVENOUS
  Filled 2020-02-03: qty 19

## 2020-02-03 MED ORDER — PALONOSETRON HCL INJECTION 0.25 MG/5ML: 0.2500 mg | Freq: Once | INTRAVENOUS | Status: DC

## 2020-02-03 MED ORDER — DIPHENHYDRAMINE HCL 50 MG/ML IJ SOLN
50.0000 mg | Freq: Once | INTRAMUSCULAR | Status: AC
  Administered 2020-02-03: 50 mg via INTRAVENOUS
  Filled 2020-02-03: qty 1

## 2020-02-03 MED ORDER — FAMOTIDINE IN NACL 20-0.9 MG/50ML-% IV SOLN
20.0000 mg | Freq: Once | INTRAVENOUS | Status: AC
  Administered 2020-02-03: 20 mg via INTRAVENOUS
  Filled 2020-02-03: qty 50

## 2020-02-03 MED ORDER — HEPARIN SOD (PORK) LOCK FLUSH 100 UNIT/ML IV SOLN
INTRAVENOUS | Status: AC
  Filled 2020-02-03: qty 5

## 2020-02-03 MED ORDER — SODIUM CHLORIDE 0.9% FLUSH
10.0000 mL | Freq: Once | INTRAVENOUS | Status: AC
  Administered 2020-02-03: 10 mL via INTRAVENOUS
  Filled 2020-02-03: qty 10

## 2020-02-03 MED ORDER — SODIUM CHLORIDE 0.9 % IV SOLN
Freq: Once | INTRAVENOUS | Status: AC
  Filled 2020-02-03: qty 250

## 2020-02-03 MED ORDER — HEPARIN SOD (PORK) LOCK FLUSH 100 UNIT/ML IV SOLN
500.0000 [IU] | Freq: Once | INTRAVENOUS | Status: AC | PRN
  Administered 2020-02-03: 500 [IU]
  Filled 2020-02-03: qty 5

## 2020-02-09 NOTE — Progress Notes (Signed)
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 Tollie Pizza in the past and has undergone breast biopsies which did not previously showed malignancy. More recently patient noted some red discoloration around her right perioral as well as a palpable mass which led to a diagnostic mammogram on the right side her prior mammogram in February 2020 was normal in August 2020 she was noted to have a 2.7 x 2 x 2.3 cm mass in her right breast along with 4 morphologically abnormal lymph nodes. She underwent breast biopsy as well as lymph node biopsy which showed grade 3 invasive mammary carcinoma. ER PR and HER-2/neunegative.Sections show high-grade invasive carcinoma with focal squamous differentiation and pinpoint keratin ideation. The features are concerning for metaplastic differentiation.[name] Lymphovascular invasion present. There was extranodal extension present on the lymph node biopsy.   Her family history is significant for breast cancer in her mother in her 4s. Father had colon cancer in his70s.Family history also significant for ovarian cancer in her maternal aunt.  MRI of bilateral breasts showed: Primary right breast mass was 3.5 x 2.7 x 3.4 cm. There were surrounding numerous  nodules consistent with satellite lesions. Extensive nodular non-mass enhancement throughout the right breast involving the upper and lower quadrants spanning 6.2 x 4.5 x 5.5 cm. Markedly enhancement of the left breast diffusely. Non-mass enhancement measures 1.9 x 2.4 cm. At least 3 abnormal lymph nodes in the right axilla. No abnormal left axillary lymph nodes. 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 Equivocal inferior breast and left axillary nodal hypermetabolism. No evidence of extrathoracic hypermetabolic metastases. Lateral right breast primary with right axillary nodal metastases  Patient had 3 cycles of neoadjuvant AC chemotherapy along with Keytruda. There was no significant response in her tumor and patient proceeded with bilateral mastectomy with targeted node dissection. 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 7 lymph nodes were positive for malignancy with extranodal extension overall cancer cellularity was 40% [id number].Patient completed 1 cycle of adjuvant AC chemotherapy and will start weekly Taxol   Interval history-she reports some neuropathy in her feet which is essentially stable and has not persisted.  ECOG PS- 1 Pain scale- 0   Review of systems- Review of Systems  Constitutional: Negative for chills, fever, malaise/fatigue and weight loss.  HENT: Negative for congestion, ear discharge and nosebleeds.   Eyes: Negative for blurred vision.  Respiratory: Negative for cough, hemoptysis, sputum production, shortness of breath and wheezing.   Cardiovascular: Negative for chest pain, palpitations, orthopnea and claudication.  Gastrointestinal: Negative for abdominal pain, blood in stool, constipation, diarrhea, heartburn, melena, nausea and vomiting.  Genitourinary: Negative for dysuria, flank pain, frequency,  hematuria and urgency.  Musculoskeletal: Negative for back pain, joint pain and myalgias.  Skin: Negative  for rash.  Neurological: Positive for sensory change (Peripheral neuropathy). Negative for dizziness, tingling, focal weakness, seizures, weakness and headaches.  Endo/Heme/Allergies: Does not bruise/bleed easily.  Psychiatric/Behavioral: Negative for depression and suicidal ideas. The patient does not have insomnia.        Allergies  Allergen Reactions  . Amitiza [Lubiprostone] Other (See Comments)    Overly effective at higher dose  . Bentyl [Dicyclomine Hcl]     Lack of effect  . Celecoxib     REACTION: unspecified  . Cortisone Other (See Comments)    flush  . Mobic [Meloxicam]     Palpitations.  But can tolerate aleve  . Nitrofurantoin Monohyd Macro   . Sulfonamide Derivatives Nausea Only  . Zoloft [Sertraline Hcl] Diarrhea and Nausea Only  . Buspar [Buspirone] Palpitations  . Doxycycline Palpitations    chest pain  . Penicillins Rash     Past Medical History:  Diagnosis Date  . Anemia    d/t chemo - last Hgb 10.8  . Anxiety    sees Dr. [name]  . Breast cancer ([place]) 2020  . Cyst of breast    per [name]  . Dysrhythmia    Hx of palpitations  . Family history of breast cancer   . Family history of colon cancer   . Family history of leukemia   . Family history of ovarian cancer   . Fibromyalgia   . GERD (gastroesophageal reflux disease)   . Heart murmur   . Hemorrhoids   . Herpes, genital 04/2015   confirmed with HSV 2 IgG  . Hiatal hernia   . Hypercholesterolemia    Dr. Kyra Searles  . Hyperlipidemia   . IBS (irritable bowel syndrome)    constipation predominant  . IC (interstitial cystitis)    per [facility]  . Mild depression ([place])   . MVP (mitral valve prolapse)    [facility] cards eval 2012  . Osteopenia    2010/2017, DEXA at [facility];spine and fem neck  . PONV (postoperative nausea and vomiting)   . Vitamin D deficiency    history of      Past Surgical History:  Procedure Laterality Date  . ABDOMINAL HYSTERECTOMY    . APPENDECTOMY    . BLADDER SURGERY     1980's  . BREAST EXCISIONAL BIOPSY Right    benign  . BREAST EXCISIONAL BIOPSY Bilateral    benign  . COLONOSCOPY  09/2014   Dr. [name]  . COLONOSCOPY  2009   at [facility] 1 POLYP (BENIGN)  . excision of breast cysts     hx of multiple cyst aspirations  . EXPLORATORY LAPAROTOMY    . MASTECTOMY MODIFIED RADICAL Bilateral 10/09/2019   Procedure: RIGHT MASTECTOMY MODIFIED RADICAL AND LEFT TOTAL MASTECTOMY;  Surgeon: Rolm Bookbinder, MD;  Location: [facility];  Service: General;  Laterality: Bilateral;  . PORTA CATH INSERTION N/A 08/06/2019   Procedure: PORTA CATH INSERTION;  Surgeon: Katha Cabal, MD;  Location: [place] [facility];  Service: Cardiovascular;  Laterality: N/A;  . PORTACATH PLACEMENT Left 08/05/2019   Procedure: INSERTION PORT-A-CATH, Attempted;  Surgeon: Jules Husbands, MD;  Location: [facility];  Service: General;  Laterality: Left;  . TUBAL LIGATION      Social History   Socioeconomic History  . Marital status: Divorced    Spouse name: Not on file  . Number of children: 1  . Years of education: Not on file  . Highest education level: Not on file  Occupational History  .  Occupation: [employer]  Tobacco Use  . Smoking status: Never Smoker  . Smokeless tobacco: Never Used  Substance and Sexual Activity  . Alcohol use: No    Alcohol/week: 0.0 standard drinks  . Drug use: No  . Sexual activity: Not Currently    Birth control/protection: Surgical    Comment: Hysterectomy  Other Topics Concern  . Not on file  Social History Narrative   Married in 01/26/1997, divorced as of Jan 26, 2017, 1 son from previous relationship   Brother with MVA at 25, in rest home for many years as of 01-26-2018- died of covid recently   Social Determinants of Radio broadcast assistant Strain:   . Difficulty of Paying Living Expenses:   Food Insecurity:   . Worried About Paediatric nurse in the Last Year:   . Arboriculturist in the Last Year:   Transportation Needs:   . Film/video editor (Medical):   [name] Lack of Transportation (Non-Medical):   Physical Activity:   . Days of Exercise per Week:   . Minutes of Exercise per Session:   Stress:   . Feeling of Stress :   Social Connections:   . Frequency of Communication with Friends and Family:   . Frequency of Social Gatherings with Friends and Family:   . Attends Religious Services:   . Active Member of Clubs or Organizations:   . Attends Archivist Meetings:   [name] Marital Status:   Intimate Partner Violence:   . Fear of Current or Ex-Partner:   . Emotionally Abused:   [name] Physically Abused:   . Sexually Abused:     Family History  Problem Relation Age of Onset  . Breast cancer Mother 54  . Hypertension Mother   . Colon cancer Father 53  . Hyperlipidemia Father   . Ovarian cancer Maternal Aunt 80  . Breast cancer Cousin   . Gallbladder disease Paternal Grandmother   . Liver cancer Cousin 70       Malignant  . Cancer Maternal Uncle        unsure type     Current Outpatient Medications:  .  acetaminophen (TYLENOL) 650 MG CR tablet, Take 650 mg by mouth every 8 (eight) hours as needed for pain., Disp: , Rfl:  .  Ascorbic Acid (VITAMIN C) 100 MG tablet, Take 100 mg by mouth daily., Disp: , Rfl:  .  atorvastatin (LIPITOR) 40 MG tablet, Take 40 mg by mouth at bedtime. , Disp: , Rfl: 1 .  cholecalciferol (VITAMIN D3) 25 MCG (1000 UT) tablet, Take 1,000 Units by mouth daily. , Disp: , Rfl:  .  clonazePAM (KLONOPIN) 0.5 MG tablet, Take 0.5 mg by mouth 2 (two) times daily. , Disp: , Rfl:  .  DULoxetine (CYMBALTA) 60 MG capsule, Take 60 mg by mouth daily. , Disp: , Rfl:  .  gabapentin (NEURONTIN) 100 MG capsule, Take 1-2 capsules (100-200 mg total) by mouth 3 (three) times daily as needed., Disp: 90 capsule, Rfl: 1 .  gentamicin ointment (GARAMYCIN) 0.1 %, Apply 1 application topically in the  morning, at noon, and at bedtime., Disp: , Rfl:  .  lidocaine-prilocaine (EMLA) cream, Apply to affected area once (Patient taking differently: Apply 1 application topically daily as needed [facility] access). ), Disp: 30 g, Rfl: 3 .  loratadine (CLARITIN) 10 MG tablet, Take 1 tablet (10 mg total) by mouth daily., Disp: , Rfl:  .  magic mouthwash w/lidocaine SOLN, Take 5 mLs by  mouth 4 (four) times daily. 80 ml viscous lidocaine 2%, 80 ml Mylanta, 80 ml Diphenhydramine 12.5 mg/5 ml Elixir, 80 ml Nystatin 100,000 Unit suspension, 80 ml Prednisolone 15 mg/70m, 80 ml Distilled Water. Sig: Swish/Swallow 5-10 ml four times a day as needed. Dispense 480 ml. 3RFs, Disp: 480 mL, Rfl: 3 .  ondansetron (ZOFRAN) 8 MG tablet, Take 1 tablet (8 mg total) by mouth 2 (two) times daily as needed. Start on the third day after chemotherapy., Disp: 30 tablet, Rfl: 1 .  pantoprazole (PROTONIX) 40 MG tablet, Take 1 tablet (40 mg total) by mouth daily., Disp: 30 tablet, Rfl: 1 .  Plecanatide (TRULANCE) 3 MG TABS, Take 3 mg by mouth daily. , Disp: , Rfl:  .  sennosides-docusate sodium (SENOKOT-S) 8.6-50 MG tablet, Take 1-2 tablets by mouth at bedtime. , Disp: , Rfl:  .  valACYclovir (VALTREX) 500 MG tablet, Take 500 mg by mouth daily., Disp: , Rfl:  .  vitamin B-12 (CYANOCOBALAMIN) 500 MCG tablet, Take 500 mcg by mouth daily., Disp: , Rfl:  .  dexamethasone (DECADRON) 4 MG tablet, Take 2 tablets by mouth daily starting the day after Carboplatin and Cytoxan x 3 days. Take with food. (Patient not taking: Reported on 02/02/2020), Disp: 30 tablet, Rfl: 1 .  doxycycline (VIBRA-TABS) 100 MG tablet, Take 100 mg by mouth 2 (two) times daily., Disp: , Rfl:  .  HYDROcodone-acetaminophen (NORCO/VICODIN) 5-325 MG tablet, Take 1 tablet by mouth every 6 (six) hours as needed for moderate pain. (Patient not taking: Reported on 02/02/2020), Disp: 15 tablet, Rfl: 0 .  methocarbamol (ROBAXIN) 500 MG tablet, Take 1 tablet (500 mg total) by mouth every 6  (six) hours as needed for muscle spasms. (Patient not taking: Reported on 02/02/2020), Disp: 30 tablet, Rfl: 1 .  prochlorperazine (COMPAZINE) 10 MG tablet, Take 1 tablet (10 mg total) by mouth every 6 (six) hours as needed (Nausea or vomiting). (Patient not taking: Reported on 02/02/2020), Disp: 30 tablet, Rfl: 1 .  sucralfate (CARAFATE) 1 g tablet, Take 1 g by mouth 3 (three) times daily., Disp: , Rfl:   Physical exam:  Vitals:   02/03/20 1116  BP: 120/65  Pulse: 80  Temp: 97.8 F (36.6 C)  TempSrc: Tympanic  Weight: 150 lb (68 kg)   Physical Exam HENT:     Head: Normocephalic and atraumatic.  Eyes:     Pupils: Pupils are equal, round, and reactive to light.  Cardiovascular:     Rate and Rhythm: Normal rate and regular rhythm.     Heart sounds: Normal heart sounds.  Pulmonary:     Effort: Pulmonary effort is normal.     Breath sounds: Normal breath sounds.  Abdominal:     General: Bowel sounds are normal.     Palpations: Abdomen is soft.  Musculoskeletal:     Cervical back: Normal range of motion.  Skin:    General: Skin is warm and dry.  Neurological:     Mental Status: She is alert and oriented to person, place, and time.      CMP Latest Ref Rng & Units 02/03/2020  Glucose 70 - 99 mg/dL 101(H)  BUN 8 - 23 mg/dL 14  Creatinine 0.44 - 1.00 mg/dL 0.57  Sodium 135 - 145 mmol/L 136  Potassium 3.5 - 5.1 mmol/L 3.7  Chloride 98 - 111 mmol/L 102  CO2 22 - 32 mmol/L 25  Calcium 8.9 - 10.3 mg/dL 8.7(L)  Total Protein 6.5 - 8.1 g/dL 6.4(L)  Total  Bilirubin 0.3 - 1.2 mg/dL 0.4  Alkaline Phos 38 - 126 U/L 52  AST 15 - 41 U/L 19  ALT 0 - 44 U/L 17   CBC Latest Ref Rng & Units 02/03/2020  WBC 4.0 - 10.5 K/uL 6.3  Hemoglobin 12.0 - 15.0 g/dL 10.4(L)  Hematocrit 36.0 - 46.0 % 33.2(L)  Platelets 150 - 400 K/uL 258     Assessment and plan- Patient is a 64 y.o. female withclinical prognostic stage IIIb cT2 cN1 M0 triple negative invasive mammary carcinoma of the right  breast.She is s/p 3 cycles of neoadjuvant AC Keytruda chemotherapy followed by bilateral mastectomy and targeted node dissection due tonosignificant response to neoadjuvant chemotherapy.She went on to get bilateral mastectomy and node dissection which showed stage IIIc disease PT 2 PN2 a.  She is here for on treatment assessment prior to cycle 8 of weekly Taxol chemotherapy  Counts okay to proceed with cycle 8 of weekly Taxol chemotherapy today.  She will proceed with cycle 9 next week and I will see her back in 2 weeks for cycle 10.  I will refer her to radiation oncology at that time to consider postmastectomy radiation given multiple positive lymph nodes.  Chemo-induced peripheral neuropathy: Mild grade 1.  Continue to monitor  Chemo-induced anemia: Hemoglobin stable around 10.  Continue to monitor   Visit Diagnosis 1. Encounter for antineoplastic chemotherapy   2. Malignant neoplasm of lower-outer quadrant of right breast of female, estrogen receptor negative ([place])   3. Antineoplastic chemotherapy induced anemia   4. Chemotherapy-induced peripheral neuropathy ([place])      Dr. Randa Evens, MD, MPH [facility] [phone] 02/09/2020 12:46 PM

## 2020-02-10 ENCOUNTER — Inpatient Hospital Stay: Payer: PPO

## 2020-02-10 ENCOUNTER — Other Ambulatory Visit: Payer: Self-pay | Admitting: Oncology

## 2020-02-10 VITALS — BP 116/60 | HR 83 | Temp 97.0°F | Resp 18 | Wt 156.4 lb

## 2020-02-10 DIAGNOSIS — Z5111 Encounter for antineoplastic chemotherapy: Secondary | ICD-10-CM | POA: Diagnosis not present

## 2020-02-10 DIAGNOSIS — C50511 Malignant neoplasm of lower-outer quadrant of right female breast: Secondary | ICD-10-CM

## 2020-02-10 DIAGNOSIS — Z171 Estrogen receptor negative status [ER-]: Secondary | ICD-10-CM

## 2020-02-10 DIAGNOSIS — Z95828 Presence of other vascular implants and grafts: Secondary | ICD-10-CM

## 2020-02-10 LAB — COMPREHENSIVE METABOLIC PANEL
ALT: 29 U/L (ref 0–44)
AST: 25 U/L (ref 15–41)
Albumin: 3.4 g/dL — ABNORMAL LOW (ref 3.5–5.0)
Alkaline Phosphatase: 54 U/L (ref 38–126)
Anion gap: 7 (ref 5–15)
BUN: 12 mg/dL (ref 8–23)
CO2: 26 mmol/L (ref 22–32)
Calcium: 8.6 mg/dL — ABNORMAL LOW (ref 8.9–10.3)
Chloride: 104 mmol/L (ref 98–111)
Creatinine, Ser: 0.75 mg/dL (ref 0.44–1.00)
GFR calc Af Amer: >60 mL/min (ref >60)
GFR calc non Af Amer: >60 mL/min (ref >60)
Glucose, Bld: 129 mg/dL — ABNORMAL HIGH (ref 70–99)
Potassium: 3.6 mmol/L (ref 3.5–5.1)
Sodium: 137 mmol/L (ref 135–145)
Total Bilirubin: 0.5 mg/dL (ref 0.3–1.2)
Total Protein: 6.2 g/dL — ABNORMAL LOW (ref 6.5–8.1)

## 2020-02-10 LAB — CBC WITH DIFFERENTIAL/PLATELET
Abs Immature Granulocytes: 0.05 10*3/uL (ref 0.00–0.07)
Basophils Absolute: 0.1 10*3/uL (ref 0.0–0.1)
Basophils Relative: 1 %
Eosinophils Absolute: 0.2 10*3/uL (ref 0.0–0.5)
Eosinophils Relative: 2 %
HCT: 32 % — ABNORMAL LOW (ref 36.0–46.0)
Hemoglobin: 10.4 g/dL — ABNORMAL LOW (ref 12.0–15.0)
Immature Granulocytes: 1 %
Lymphocytes Relative: 8 %
Lymphs Abs: 0.6 10*3/uL — ABNORMAL LOW (ref 0.7–4.0)
MCH: 27.7 pg (ref 26.0–34.0)
MCHC: 32.5 g/dL (ref 30.0–36.0)
MCV: 85.3 fL (ref 80.0–100.0)
Monocytes Absolute: 0.7 10*3/uL (ref 0.1–1.0)
Monocytes Relative: 10 %
Neutro Abs: 5.6 10*3/uL (ref 1.7–7.7)
Neutrophils Relative %: 78 %
Platelets: 228 10*3/uL (ref 150–400)
RBC: 3.75 MIL/uL — ABNORMAL LOW (ref 3.87–5.11)
RDW: 16.4 % — ABNORMAL HIGH (ref 11.5–15.5)
WBC: 7.1 10*3/uL (ref 4.0–10.5)
nRBC: 0 % (ref 0.0–0.2)

## 2020-02-10 MED ORDER — FAMOTIDINE IN NACL 20-0.9 MG/50ML-% IV SOLN
20.0000 mg | Freq: Once | INTRAVENOUS | Status: AC
  Administered 2020-02-10: 20 mg via INTRAVENOUS
  Filled 2020-02-10: qty 50

## 2020-02-10 MED ORDER — DIPHENHYDRAMINE HCL 50 MG/ML IJ SOLN
50.0000 mg | Freq: Once | INTRAMUSCULAR | Status: AC
  Administered 2020-02-10: 50 mg via INTRAVENOUS
  Filled 2020-02-10: qty 1

## 2020-02-10 MED ORDER — HEPARIN SOD (PORK) LOCK FLUSH 100 UNIT/ML IV SOLN
500.0000 [IU] | Freq: Once | INTRAVENOUS | Status: AC | PRN
  Administered 2020-02-10: 500 [IU]
  Filled 2020-02-10: qty 5

## 2020-02-10 MED ORDER — SODIUM CHLORIDE 0.9% FLUSH
10.0000 mL | Freq: Once | INTRAVENOUS | Status: AC
  Administered 2020-02-10: 10 mL via INTRAVENOUS
  Filled 2020-02-10: qty 10

## 2020-02-10 MED ORDER — HEPARIN SOD (PORK) LOCK FLUSH 100 UNIT/ML IV SOLN
INTRAVENOUS | Status: AC
  Filled 2020-02-10: qty 5

## 2020-02-10 MED ORDER — SODIUM CHLORIDE 0.9 % IV SOLN
65.0000 mg/m2 | Freq: Once | INTRAVENOUS | Status: AC
  Administered 2020-02-10: 114 mg via INTRAVENOUS
  Filled 2020-02-10: qty 19

## 2020-02-10 MED ORDER — SODIUM CHLORIDE 0.9 % IV SOLN
Freq: Once | INTRAVENOUS | Status: AC
  Filled 2020-02-10: qty 250

## 2020-02-10 MED ORDER — SODIUM CHLORIDE 0.9 % IV SOLN
20.0000 mg | Freq: Once | INTRAVENOUS | Status: AC
  Administered 2020-02-10: 20 mg via INTRAVENOUS
  Filled 2020-02-10: qty 20

## 2020-02-16 ENCOUNTER — Other Ambulatory Visit: Payer: Self-pay

## 2020-02-16 ENCOUNTER — Encounter: Payer: Self-pay | Admitting: Oncology

## 2020-02-16 DIAGNOSIS — K219 Gastro-esophageal reflux disease without esophagitis: Secondary | ICD-10-CM | POA: Diagnosis not present

## 2020-02-16 DIAGNOSIS — I341 Nonrheumatic mitral (valve) prolapse: Secondary | ICD-10-CM | POA: Diagnosis not present

## 2020-02-16 DIAGNOSIS — K589 Irritable bowel syndrome without diarrhea: Secondary | ICD-10-CM | POA: Diagnosis not present

## 2020-02-16 NOTE — Progress Notes (Signed)
Patient stated that she will be seen by an urologist since she is not able to hold her urine and that she will be getting a 12 week treatment. Patient stated that she has some nausea and constipation.

## 2020-02-17 ENCOUNTER — Inpatient Hospital Stay (HOSPITAL_BASED_OUTPATIENT_CLINIC_OR_DEPARTMENT_OTHER): Payer: PPO | Admitting: Oncology

## 2020-02-17 ENCOUNTER — Other Ambulatory Visit: Payer: Self-pay | Admitting: Oncology

## 2020-02-17 ENCOUNTER — Inpatient Hospital Stay: Payer: PPO

## 2020-02-17 ENCOUNTER — Encounter: Payer: Self-pay | Admitting: *Deleted

## 2020-02-17 ENCOUNTER — Other Ambulatory Visit: Payer: Self-pay

## 2020-02-17 ENCOUNTER — Telehealth: Payer: Self-pay | Admitting: *Deleted

## 2020-02-17 VITALS — BP 140/66 | HR 80 | Temp 97.0°F | Resp 18

## 2020-02-17 VITALS — BP 128/65 | HR 81 | Temp 96.7°F | Wt 155.9 lb

## 2020-02-17 DIAGNOSIS — D0512 Intraductal carcinoma in situ of left breast: Secondary | ICD-10-CM | POA: Diagnosis not present

## 2020-02-17 DIAGNOSIS — T451X5A Adverse effect of antineoplastic and immunosuppressive drugs, initial encounter: Secondary | ICD-10-CM

## 2020-02-17 DIAGNOSIS — M8580 Other specified disorders of bone density and structure, unspecified site: Secondary | ICD-10-CM

## 2020-02-17 DIAGNOSIS — G620 Drug-induced polyneuropathy: Secondary | ICD-10-CM

## 2020-02-17 DIAGNOSIS — Z79899 Other long term (current) drug therapy: Secondary | ICD-10-CM | POA: Diagnosis not present

## 2020-02-17 DIAGNOSIS — C50511 Malignant neoplasm of lower-outer quadrant of right female breast: Secondary | ICD-10-CM

## 2020-02-17 DIAGNOSIS — Z171 Estrogen receptor negative status [ER-]: Secondary | ICD-10-CM | POA: Diagnosis not present

## 2020-02-17 DIAGNOSIS — Z95828 Presence of other vascular implants and grafts: Secondary | ICD-10-CM

## 2020-02-17 DIAGNOSIS — Z170 Estrogen receptor positive status [ER+]: Secondary | ICD-10-CM | POA: Diagnosis not present

## 2020-02-17 DIAGNOSIS — Z9013 Acquired absence of bilateral breasts and nipples: Secondary | ICD-10-CM | POA: Diagnosis not present

## 2020-02-17 DIAGNOSIS — Z800 Family history of malignant neoplasm of digestive organs: Secondary | ICD-10-CM

## 2020-02-17 DIAGNOSIS — Z006 Encounter for examination for normal comparison and control in clinical research program: Secondary | ICD-10-CM | POA: Diagnosis not present

## 2020-02-17 DIAGNOSIS — Z8041 Family history of malignant neoplasm of ovary: Secondary | ICD-10-CM

## 2020-02-17 DIAGNOSIS — Z806 Family history of leukemia: Secondary | ICD-10-CM

## 2020-02-17 DIAGNOSIS — R300 Dysuria: Secondary | ICD-10-CM

## 2020-02-17 DIAGNOSIS — E785 Hyperlipidemia, unspecified: Secondary | ICD-10-CM | POA: Diagnosis not present

## 2020-02-17 DIAGNOSIS — Z5111 Encounter for antineoplastic chemotherapy: Secondary | ICD-10-CM

## 2020-02-17 LAB — CBC WITH DIFFERENTIAL/PLATELET
Abs Immature Granulocytes: 0.04 10*3/uL (ref 0.00–0.07)
Basophils Absolute: 0 10*3/uL (ref 0.0–0.1)
Basophils Relative: 1 %
Eosinophils Absolute: 0.2 10*3/uL (ref 0.0–0.5)
Eosinophils Relative: 3 %
HCT: 31.5 % — ABNORMAL LOW (ref 36.0–46.0)
Hemoglobin: 10.3 g/dL — ABNORMAL LOW (ref 12.0–15.0)
Immature Granulocytes: 1 %
Lymphocytes Relative: 9 %
Lymphs Abs: 0.5 10*3/uL — ABNORMAL LOW (ref 0.7–4.0)
MCH: 28.1 pg (ref 26.0–34.0)
MCHC: 32.7 g/dL (ref 30.0–36.0)
MCV: 85.8 fL (ref 80.0–100.0)
Monocytes Absolute: 0.6 10*3/uL (ref 0.1–1.0)
Monocytes Relative: 11 %
Neutro Abs: 4.3 10*3/uL (ref 1.7–7.7)
Neutrophils Relative %: 75 %
Platelets: 242 10*3/uL (ref 150–400)
RBC: 3.67 MIL/uL — ABNORMAL LOW (ref 3.87–5.11)
RDW: 16.7 % — ABNORMAL HIGH (ref 11.5–15.5)
WBC: 5.6 10*3/uL (ref 4.0–10.5)
nRBC: 0 % (ref 0.0–0.2)

## 2020-02-17 LAB — COMPREHENSIVE METABOLIC PANEL
ALT: 21 U/L (ref 0–44)
AST: 19 U/L (ref 15–41)
Albumin: 3.5 g/dL (ref 3.5–5.0)
Alkaline Phosphatase: 50 U/L (ref 38–126)
Anion gap: 7 (ref 5–15)
BUN: 14 mg/dL (ref 8–23)
CO2: 27 mmol/L (ref 22–32)
Calcium: 8.6 mg/dL — ABNORMAL LOW (ref 8.9–10.3)
Chloride: 104 mmol/L (ref 98–111)
Creatinine, Ser: 0.86 mg/dL (ref 0.44–1.00)
GFR calc Af Amer: >60 mL/min (ref >60)
GFR calc non Af Amer: >60 mL/min (ref >60)
Glucose, Bld: 113 mg/dL — ABNORMAL HIGH (ref 70–99)
Potassium: 3.6 mmol/L (ref 3.5–5.1)
Sodium: 138 mmol/L (ref 135–145)
Total Bilirubin: 0.4 mg/dL (ref 0.3–1.2)
Total Protein: 6.2 g/dL — ABNORMAL LOW (ref 6.5–8.1)

## 2020-02-17 LAB — URINALYSIS, COMPLETE (UACMP) WITH MICROSCOPIC
Bilirubin Urine: NEGATIVE
Glucose, UA: NEGATIVE mg/dL
Hgb urine dipstick: NEGATIVE
Ketones, ur: NEGATIVE mg/dL
Nitrite: NEGATIVE
Protein, ur: NEGATIVE mg/dL
Specific Gravity, Urine: 1.002 — ABNORMAL LOW (ref 1.005–1.030)
Squamous Epithelial / HPF: NONE SEEN (ref 0–5)
pH: 6 (ref 5.0–8.0)

## 2020-02-17 MED ORDER — SODIUM CHLORIDE 0.9% FLUSH
10.0000 mL | Freq: Once | INTRAVENOUS | Status: AC
  Administered 2020-02-17: 10 mL via INTRAVENOUS
  Filled 2020-02-17: qty 10

## 2020-02-17 MED ORDER — SODIUM CHLORIDE 0.9 % IV SOLN
65.0000 mg/m2 | Freq: Once | INTRAVENOUS | Status: AC
  Administered 2020-02-17: 114 mg via INTRAVENOUS
  Filled 2020-02-17: qty 19

## 2020-02-17 MED ORDER — HEPARIN SOD (PORK) LOCK FLUSH 100 UNIT/ML IV SOLN
500.0000 [IU] | Freq: Once | INTRAVENOUS | Status: AC | PRN
  Administered 2020-02-17: 500 [IU]
  Filled 2020-02-17: qty 5

## 2020-02-17 MED ORDER — DIPHENHYDRAMINE HCL 50 MG/ML IJ SOLN
50.0000 mg | Freq: Once | INTRAMUSCULAR | Status: AC
  Administered 2020-02-17: 50 mg via INTRAVENOUS
  Filled 2020-02-17: qty 1

## 2020-02-17 MED ORDER — CIPROFLOXACIN HCL 250 MG PO TABS: 250.0000 mg | ORAL_TABLET | Freq: Two times a day (BID) | ORAL | 0 refills | Status: AC

## 2020-02-17 MED ORDER — SODIUM CHLORIDE 0.9 % IV SOLN
Freq: Once | INTRAVENOUS | Status: AC
  Filled 2020-02-17: qty 250

## 2020-02-17 MED ORDER — SODIUM CHLORIDE 0.9 % IV SOLN
20.0000 mg | Freq: Once | INTRAVENOUS | Status: AC
  Administered 2020-02-17: 20 mg via INTRAVENOUS
  Filled 2020-02-17: qty 20

## 2020-02-17 MED ORDER — HEPARIN SOD (PORK) LOCK FLUSH 100 UNIT/ML IV SOLN
INTRAVENOUS | Status: AC
  Filled 2020-02-17: qty 5

## 2020-02-17 MED ORDER — FAMOTIDINE IN NACL 20-0.9 MG/50ML-% IV SOLN
20.0000 mg | Freq: Once | INTRAVENOUS | Status: AC
  Administered 2020-02-17: 20 mg via INTRAVENOUS
  Filled 2020-02-17: qty 50

## 2020-02-17 NOTE — Research (Signed)
Patient Debra Little returns to clinic as scheduled this morning for her Taxol infusion and 8 week study assessment for the SWOG S1714 study. Patient was also seen by Dr. Janese Banks this morning and she completed the week 8 Physician Treatment Burden scale. [name] week 8 [id number] Neuropathy Assessment was completed by this RN assisted by Jeral Fruit, RN. Patient is right side dominant, so the neuropen and tuning fork assessments were completed on the right lower and upper extremities. [name] also completed all of the week 8 study questionnaires including EORTC, CIPN20, PROMIS-29, GSLTPAQ and Patient Reported Symptom Burden.  [name] denies having experienced any recent falls, but does report she feels weak for 2-3 days after receiving her Taxol infusion, and she is also experiencing some increased pain in her left knee at times. States she takes Tylenol Arthritis on occasion for the knee pain and also reports it helps the pain when she gets steroids for her Taxol treatments. Patient reports that she is experiencing some increased neuropathy symptoms - not worse symptoms, just increased duration of numbness and tingling, bur reports she is still able to manage all of her ADLs without any assistance. Solicited CTCAE Assessment as noted below with grade and attribution verified with Dr. Janese Banks. Patient describes sensory neuropathy in left foot that is painful and affects her balance at times, but this has not increased from baseline. She also reports numbness and tingling in her fingertips, feet and toes - worse in fingers now and almost constant in nature with ongoing Taxol infusions. She also has 1+ pitting edema this morning and reports she has experienced some increased edema since beginning chemotherapy. Dr. Janese Banks assessed the edema, but states she does not want to treat her while on chemotherapy in the event that it resolves on its own after patient finishes. [name] also report tenderness of her  fingernails that affects her ability to grip things at times and states her left great toenail has thickened and feels like there is something under the nail. Dr. Janese Banks has encouraged her to wait until chemotherapy is complete (in two weeks) before seeking treatment for this unless it becomes significantly worse. [name] reports a long history of Fibromyalgia and has been treated with gabapentin since 05/18/2011.  Study/Protocol: SWOG [id number] Cycle: 8 week Event Grade Onset Date Resolved Date Drug Name Attribution Treatment Comments  Dysesthesia 1 05/18/2011   unrelated gabapentin Reported at baseline  Neuralgia 0        Paresthesia 1 05/18/2011   unrelated gabapentin Reported at baseline  Peripheral motor neuropathy 0        Peripheral sensory neuropathy 1 05/18/2011   possibly gabapentin Reported at baseline, a little more increased with paclitaxel  Yolande Jolly, BSN, MHA, OCN 02/17/2020 11:43 AM

## 2020-02-17 NOTE — Telephone Encounter (Signed)
Dr. Janese Banks wanted me to call pt and see if she can take cipro for UTI-she states that she took it several times close together and it caused her to have skipping  Beats for her heart. She states that it helped her the most for UTI. She has not taken it in a long time and would like to try it. I asked if she had atb that did not give her issues and she said the atb she got from surgeon back in nov. 2020. I called pharmacy and it was doxycycline and that is not a atb we can use for UTI. Aslo pt has allergy to nitrofurantuin and dr Janese Banks wanted to know what kind of reaction does she have with it. She states that she does not remember and if it is on her list then she probably had a bad reaction. Let dr Janese Banks know and she will give her cipro for short supply

## 2020-02-19 DIAGNOSIS — R350 Frequency of micturition: Secondary | ICD-10-CM | POA: Diagnosis not present

## 2020-02-19 DIAGNOSIS — N3946 Mixed incontinence: Secondary | ICD-10-CM | POA: Diagnosis not present

## 2020-02-19 NOTE — Progress Notes (Signed)
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 Tollie Pizza in the past and has undergone breast biopsies which did not previously showed malignancy. More recently patient noted some red discoloration around her right perioral as well as a palpable mass which led to a diagnostic mammogram on the right side her prior mammogram in February 2020 was normal in August 2020 she was noted to have a 2.7 x 2 x 2.3 cm mass in her right breast along with 4 morphologically abnormal lymph nodes. She underwent breast biopsy as well as lymph node biopsy which showed grade 3 invasive mammary carcinoma. ER PR and HER-2/neunegative.Sections show high-grade invasive carcinoma with focal squamous differentiation and pinpoint keratin ideation. The features are concerning for metaplastic differentiation.[name] Lymphovascular invasion present. There was extranodal extension present on the lymph node biopsy.   Her family history is significant for breast cancer in her mother in her 2s. Father had colon cancer in his70s.Family history also significant for ovarian cancer in her maternal aunt.  MRI of bilateral breasts showed: Primary right breast mass was 3.5 x 2.7 x 3.4 cm. There were surrounding numerous  nodules consistent with satellite lesions. Extensive nodular non-mass enhancement throughout the right breast involving the upper and lower quadrants spanning 6.2 x 4.5 x 5.5 cm. Markedly enhancement of the left breast diffusely. Non-mass enhancement measures 1.9 x 2.4 cm. At least 3 abnormal lymph nodes in the right axilla. No abnormal left axillary lymph nodes. 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 Equivocal inferior breast and left axillary nodal hypermetabolism. No evidence of extrathoracic hypermetabolic metastases. Lateral right breast primary with right axillary nodal metastases  Patient had 3 cycles of neoadjuvant AC chemotherapy along with Keytruda. There was no significant response in her tumor and patient proceeded with bilateral mastectomy with targeted node dissection. 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 7 lymph nodes were positive for malignancy with extranodal extension overall cancer cellularity was 40% [id number].Patient completed 1 cycle of adjuvant AC chemotherapy and will complete weekly Taxol chemotherapy in April 2021   Interval history-she still has some tingling numbness in her hands and feet which is overall stable.  Reports ongoing fatigue.  Also reports some burning urination for the last 1 to 2 days.  Denies other complaints at this time  ECOG PS- 1 Pain scale- 0   Review of systems- Review of Systems  Constitutional: Positive for malaise/fatigue. Negative for chills, fever and weight loss.  HENT: Negative for congestion, ear discharge and nosebleeds.   Eyes: Negative for blurred vision.  Respiratory: Negative for cough, hemoptysis, sputum production, shortness of breath and wheezing.   Cardiovascular: Negative for chest pain, palpitations, orthopnea and claudication.  Gastrointestinal: Negative for  abdominal pain, blood in stool,  constipation, diarrhea, heartburn, melena, nausea and vomiting.  Genitourinary: Positive for dysuria. Negative for flank pain, frequency, hematuria and urgency.  Musculoskeletal: Negative for back pain, joint pain and myalgias.  Skin: Negative for rash.  Neurological: Positive for sensory change (Peripheral neuropathy). Negative for dizziness, tingling, focal weakness, seizures, weakness and headaches.  Endo/Heme/Allergies: Does not bruise/bleed easily.  Psychiatric/Behavioral: Negative for depression and suicidal ideas. The patient does not have insomnia.       Allergies  Allergen Reactions   Amitiza [Lubiprostone] Other (See Comments)    Overly effective at higher dose   Bentyl [Dicyclomine Hcl]     Lack of effect   Celecoxib     REACTION: unspecified   Cortisone Other (See Comments)    flush   Mobic [Meloxicam]     Palpitations.  But can tolerate aleve   Nitrofurantoin Monohyd Macro    Sulfonamide Derivatives Nausea Only   Zoloft [Sertraline Hcl] Diarrhea and Nausea Only   Buspar [Buspirone] Palpitations   Doxycycline Palpitations    chest pain   Penicillins Rash     Past Medical History:  Diagnosis Date   Anemia    d/t chemo - last Hgb 10.8   Anxiety    sees Dr. [name]   Breast cancer [facility]) 2020   Cyst of breast    per [name]   Dysrhythmia    Hx of palpitations   Family history of breast cancer    Family history of colon cancer    Family history of leukemia    Family history of ovarian cancer    Fibromyalgia    GERD (gastroesophageal reflux disease)    Heart murmur    Hemorrhoids    Herpes, genital 04/2015   confirmed with HSV 2 IgG   Hiatal hernia    Hypercholesterolemia    Dr. Kyra Searles   Hyperlipidemia    IBS (irritable bowel syndrome)    constipation predominant   IC (interstitial cystitis)    per [place] [facility]   Mild depression ([place])    MVP (mitral valve prolapse)    [facility]  cards eval 2012   Osteopenia    2010/2017, DEXA at [facility];spine and fem neck   PONV (postoperative nausea and vomiting)    Vitamin D deficiency    history of     Past Surgical History:  Procedure Laterality Date   ABDOMINAL HYSTERECTOMY     APPENDECTOMY     BLADDER SURGERY     1980's   BREAST EXCISIONAL BIOPSY Right    benign   BREAST EXCISIONAL BIOPSY Bilateral    benign   COLONOSCOPY  09/2014   Dr. [name]   COLONOSCOPY  2009   at [facility] 1 POLYP (BENIGN)   excision of breast cysts     hx of multiple cyst aspirations   EXPLORATORY LAPAROTOMY     MASTECTOMY MODIFIED RADICAL Bilateral 10/09/2019   Procedure: RIGHT MASTECTOMY MODIFIED RADICAL AND LEFT TOTAL MASTECTOMY;  Surgeon: Rolm Bookbinder, MD;  Location: [place];  Service: General;  Laterality: Bilateral;   PORTA CATH INSERTION N/A 08/06/2019   Procedure: PORTA CATH INSERTION;  Surgeon: Katha Cabal, MD;  Location: [place] [facility];  Service: Cardiovascular;  Laterality: N/A;   PORTACATH PLACEMENT Left 08/05/2019   Procedure: INSERTION PORT-A-CATH, Attempted;  Surgeon: Jules Husbands, MD;  Location: [facility];  Service: General;  Laterality: Left;   TUBAL LIGATION      Social History   Socioeconomic History   Marital status: Divorced  Spouse name: Not on file   Number of children: 1   Years of education: Not on file   Highest education level: Not on file  Occupational History   Occupation: [employer]  Tobacco Use   Smoking status: Never Smoker   Smokeless tobacco: Never Used  Substance and Sexual Activity   Alcohol use: No    Alcohol/week: 0.0 standard drinks   Drug use: No   Sexual activity: Not Currently    Birth control/protection: Surgical    Comment: Hysterectomy  Other Topics Concern   Not on file  Social History Narrative   Married in 1997/02/12, divorced as of 12-Feb-2017, 1 son from previous relationship   Brother with MVA at 70, in rest home for many years as of Feb 12, 2018- died of covid  recently   Social Determinants of Radio broadcast assistant Strain:    Difficulty of Paying Living Expenses:   Food Insecurity:    Worried About Charity fundraiser in the Last Year:    Arboriculturist in the Last Year:   Transportation Needs:    Film/video editor (Medical):    Lack of Transportation (Non-Medical):   Physical Activity:    Days of Exercise per Week:    Minutes of Exercise per Session:   Stress:    Feeling of Stress :   Social Connections:    Frequency of Communication with Friends and Family:    Frequency of Social Gatherings with Friends and Family:    Attends Religious Services:    Active Member of Clubs or Organizations:    Attends Music therapist:    Marital Status:   Intimate Partner Violence:    Fear of Current or Ex-Partner:    Emotionally Abused:    Physically Abused:    Sexually Abused:     Family History  Problem Relation Age of Onset   Breast cancer Mother 55   Hypertension Mother    Colon cancer Father 1   Hyperlipidemia Father    Ovarian cancer Maternal Aunt 80   Breast cancer Cousin    Gallbladder disease Paternal Grandmother    Liver cancer Cousin 30       Malignant   Cancer Maternal Uncle        unsure type     Current Outpatient Medications:    acetaminophen (TYLENOL) 650 MG CR tablet, Take 650 mg by mouth every 8 (eight) hours as needed for pain., Disp: , Rfl:    Ascorbic Acid (VITAMIN C) 100 MG tablet, Take 100 mg by mouth daily., Disp: , Rfl:    atorvastatin (LIPITOR) 40 MG tablet, Take 40 mg by mouth at bedtime. , Disp: , Rfl: 1   cholecalciferol (VITAMIN D3) 25 MCG (1000 UT) tablet, Take 1,000 Units by mouth daily. , Disp: , Rfl:    clonazePAM (KLONOPIN) 0.5 MG tablet, Take 0.5 mg by mouth 2 (two) times daily. , Disp: , Rfl:    doxycycline (VIBRA-TABS) 100 MG tablet, Take 100 mg by mouth 2 (two) times daily., Disp: , Rfl:    DULoxetine (CYMBALTA) 60 MG capsule, Take 60  mg by mouth daily. , Disp: , Rfl:    gabapentin (NEURONTIN) 100 MG capsule, Take 1-2 capsules (100-200 mg total) by mouth 3 (three) times daily as needed., Disp: 90 capsule, Rfl: 1   gentamicin ointment (GARAMYCIN) 0.1 %, Apply 1 application topically in the morning, at noon, and at bedtime., Disp: , Rfl:    HYDROcodone-acetaminophen (NORCO/VICODIN) 5-325 MG  tablet, Take 1 tablet by mouth every 6 (six) hours as needed for moderate pain., Disp: 15 tablet, Rfl: 0   loratadine (CLARITIN) 10 MG tablet, Take 1 tablet (10 mg total) by mouth daily., Disp: , Rfl:    magic mouthwash w/lidocaine SOLN, Take 5 mLs by mouth 4 (four) times daily. 80 ml viscous lidocaine 2%, 80 ml Mylanta, 80 ml Diphenhydramine 12.5 mg/5 ml Elixir, 80 ml Nystatin 100,000 Unit suspension, 80 ml Prednisolone 15 mg/72m, 80 ml Distilled Water. Sig: Swish/Swallow 5-10 ml four times a day as needed. Dispense 480 ml. 3RFs, Disp: 480 mL, Rfl: 3   methocarbamol (ROBAXIN) 500 MG tablet, Take 1 tablet (500 mg total) by mouth every 6 (six) hours as needed for muscle spasms., Disp: 30 tablet, Rfl: 1   Plecanatide (TRULANCE) 3 MG TABS, Take 3 mg by mouth daily. , Disp: , Rfl:    prochlorperazine (COMPAZINE) 10 MG tablet, Take 1 tablet (10 mg total) by mouth every 6 (six) hours as needed (Nausea or vomiting)., Disp: 30 tablet, Rfl: 1   sennosides-docusate sodium (SENOKOT-S) 8.6-50 MG tablet, Take 1-2 tablets by mouth at bedtime. , Disp: , Rfl:    vitamin B-12 (CYANOCOBALAMIN) 500 MCG tablet, Take 500 mcg by mouth daily., Disp: , Rfl:    ciprofloxacin (CIPRO) 250 MG tablet, Take 1 tablet (250 mg total) by mouth 2 (two) times daily for 7 days., Disp: 14 tablet, Rfl: 0   dexamethasone (DECADRON) 4 MG tablet, Take 2 tablets by mouth daily starting the day after Carboplatin and Cytoxan x 3 days. Take with food. (Patient not taking: Reported on 02/16/2020), Disp: 30 tablet, Rfl: 1   lidocaine-prilocaine (EMLA) cream, APPLY TO AFFECTED AREAS  ONCE AS DIRECTED, Disp: 30 g, Rfl: 3   ondansetron (ZOFRAN) 8 MG tablet, Take 1 tablet (8 mg total) by mouth 2 (two) times daily as needed. Start on the third day after chemotherapy. (Patient not taking: Reported on 02/16/2020), Disp: 30 tablet, Rfl: 1   pantoprazole (PROTONIX) 40 MG tablet, TAKE 1 TABLET BY MOUTH ONCE A DAY, Disp: 30 tablet, Rfl: 1   sucralfate (CARAFATE) 1 g tablet, Take 1 g by mouth 3 (three) times daily., Disp: , Rfl:    valACYclovir (VALTREX) 500 MG tablet, Take 500 mg by mouth daily., Disp: , Rfl:   Physical exam:  Vitals:   02/17/20 1022  BP: 128/65  Pulse: 81  Temp: (!) 96.7 F (35.9 C)  TempSrc: Tympanic  SpO2: 100%  Weight: 155 lb 14.4 oz (70.7 kg)   Physical Exam Constitutional:      General: She is not in acute distress. HENT:     Head: Normocephalic and atraumatic.  Eyes:     Pupils: Pupils are equal, round, and reactive to light.  Cardiovascular:     Rate and Rhythm: Normal rate and regular rhythm.     Heart sounds: Normal heart sounds.  Pulmonary:     Effort: Pulmonary effort is normal.     Breath sounds: Normal breath sounds.  Abdominal:     General: Bowel sounds are normal.     Palpations: Abdomen is soft.  Musculoskeletal:     Cervical back: Normal range of motion.  Skin:    General: Skin is warm and dry.  Neurological:     Mental Status: She is alert and oriented to person, place, and time.      CMP Latest Ref Rng & Units 02/17/2020  Glucose 70 - 99 mg/dL 113(H)  BUN 8 - 23  mg/dL 14  Creatinine 0.44 - 1.00 mg/dL 0.86  Sodium 135 - 145 mmol/L 138  Potassium 3.5 - 5.1 mmol/L 3.6  Chloride 98 - 111 mmol/L 104  CO2 22 - 32 mmol/L 27  Calcium 8.9 - 10.3 mg/dL 8.6(L)  Total Protein 6.5 - 8.1 g/dL 6.2(L)  Total Bilirubin 0.3 - 1.2 mg/dL 0.4  Alkaline Phos 38 - 126 U/L 50  AST 15 - 41 U/L 19  ALT 0 - 44 U/L 21   CBC Latest Ref Rng & Units 02/17/2020  WBC 4.0 - 10.5 K/uL 5.6  Hemoglobin 12.0 - 15.0 g/dL 10.3(L)  Hematocrit 36.0 -  46.0 % 31.5(L)  Platelets 150 - 400 K/uL 242      Assessment and plan- Patient is a 64 y.o. female withclinical prognostic stage IIIb cT2 cN1 M0 triple negative invasive mammary carcinoma of the right breast.She is s/p 3 cycles of neoadjuvant AC Keytruda chemotherapy followed by bilateral mastectomy and targeted node dissection due tonosignificant response to neoadjuvant chemotherapy.She went on to get bilateral mastectomy and node dissection which showed stage IIIc disease pT 2 pN2 a.  She is here for on treatment assessment prior to cycle 10 of weekly Taxol chemotherapy  Counts okay to proceed with cycle 10 of weekly Taxol chemotherapy today.  She will directly proceed for cycle 11 next week and I will see her back in 2 weeks for cycle 12 which will be her last cycle.  Chemo-induced anemia: Stable around 10 continue to monitor  Chemo-induced peripheral neuropathy:Stable grade 1.  Patient is currently on gabapentin 100 mg 3 times a day and have asked her to increase it to 200 mg 3 times a day.  We will also consider outpatient referral to acupuncture post chemotherapy  We will refer patient to radiation oncology for chest wall radiation following chemotherapy.  We will also plan to repeat CT chest abdomen pelvis with contrast 4 weeks from now.  Dysuria: We will check urinalysis today and if positive and prescribe antibiotics   Visit Diagnosis 1. Malignant neoplasm of lower-outer quadrant of right breast of female, estrogen receptor negative ([place])   2. Dysuria   3. Encounter for antineoplastic chemotherapy   4. Chemotherapy-induced peripheral neuropathy ([facility])      Dr. Randa Evens, MD, MPH [facility] [phone] 02/19/2020 1:12 PM

## 2020-02-20 ENCOUNTER — Inpatient Hospital Stay (HOSPITAL_BASED_OUTPATIENT_CLINIC_OR_DEPARTMENT_OTHER): Payer: PPO | Admitting: Nurse Practitioner

## 2020-02-20 ENCOUNTER — Ambulatory Visit
Admission: RE | Admit: 2020-02-20 | Discharge: 2020-02-20 | Disposition: A | Discharge status: Home / Self Care | Payer: PPO | Source: Ambulatory Visit | Attending: Nurse Practitioner | Admitting: Nurse Practitioner

## 2020-02-20 ENCOUNTER — Other Ambulatory Visit: Payer: Self-pay

## 2020-02-20 VITALS — BP 116/71 | HR 93 | Temp 97.0°F | Resp 18 | Wt 157.0 lb

## 2020-02-20 DIAGNOSIS — M7989 Other specified soft tissue disorders: Secondary | ICD-10-CM

## 2020-02-20 DIAGNOSIS — M79662 Pain in left lower leg: Secondary | ICD-10-CM | POA: Diagnosis not present

## 2020-02-20 DIAGNOSIS — M79605 Pain in left leg: Secondary | ICD-10-CM | POA: Diagnosis not present

## 2020-02-20 DIAGNOSIS — Z5111 Encounter for antineoplastic chemotherapy: Secondary | ICD-10-CM | POA: Diagnosis not present

## 2020-02-20 NOTE — Progress Notes (Signed)
[facility]  [phone] [phone]  Patient Care Team: Tonia Ghent, MD as PCP - General (Family Medicine) Bary Castilla, Forest Gleason, MD (General Surgery) Modesto Charon, MD (Family Medicine) Nori Riis, PA-C as Physician Assistant (Urology) Teodoro Spray, MD as Consulting Physician (Cardiology)   Name of the patient: Debra Little  [id number]  01/21/1956   Date of visit: 02/20/20  Diagnosis-breast cancer  Chief complaint/ Reason for visit-leg swelling  Heme/Onc history:  Oncology History  Breast cancer ([place])  07/30/2019 Initial Diagnosis   Breast cancer ([place])   08/06/2019 Cancer Staging   Staging form: Breast, AJCC 8th Edition - Clinical stage from 08/06/2019: Stage IIIB (cT2, cN1, cM0, G3, ER-, PR-, HER2-) - Signed by Sindy Guadeloupe, MD on 08/07/2019   08/11/2019 -  Chemotherapy   The patient had DOXOrubicin (ADRIAMYCIN) chemo injection 106 mg, 60 mg/m2 = 106 mg, Intravenous,  Once, 4 of 4 cycles Administration: 106 mg (08/11/2019), 106 mg (09/01/2019), 100 mg (09/22/2019), 100 mg (12/02/2019) palonosetron (ALOXI) injection 0.25 mg, 0.25 mg, Intravenous,  Once, 7 of 7 cycles Administration: 0.25 mg (08/11/2019), 0.25 mg (12/16/2019), 0.25 mg (01/06/2020), 0.25 mg (09/01/2019), 0.25 mg (12/23/2019), 0.25 mg (12/30/2019), 0.25 mg (09/22/2019), 0.25 mg (12/02/2019), 0.25 mg (01/20/2020), 0.25 mg (01/27/2020) pegfilgrastim-cbqv (UDENYCA) injection 6 mg, 6 mg, Subcutaneous, Once, 4 of 4 cycles Administration: 6 mg (08/12/2019), 6 mg (09/02/2019), 6 mg (09/23/2019), 6 mg (12/03/2019) cyclophosphamide (CYTOXAN) 1,060 mg in sodium chloride 0.9 % 250 mL chemo infusion, 600 mg/m2 = 1,060 mg, Intravenous,  Once, 4 of 4 cycles Administration: 1,000 mg (09/01/2019), 1,000 mg (09/22/2019), 1,000 mg (12/02/2019) PACLitaxel (TAXOL) 138 mg in sodium chloride 0.9 % 250 mL chemo infusion (</= '80mg'$ /m2), 80 mg/m2 = 138 mg, Intravenous,  Once, 4  of 4 cycles Dose modification: 65 mg/m2 (original dose 80 mg/m2, Cycle 6, Reason: Provider Judgment) Administration: 138 mg (12/16/2019), 138 mg (12/23/2019), 114 mg (01/13/2020), 138 mg (12/30/2019), 114 mg (02/03/2020), 114 mg (01/20/2020), 114 mg (01/27/2020), 114 mg (02/10/2020), 114 mg (02/17/2020) pembrolizumab (KEYTRUDA) 200 mg in sodium chloride 0.9 % 50 mL chemo infusion, 200 mg (100 % of original dose 200 mg), Intravenous, Once, 3 of 3 cycles Dose modification: 200 mg (original dose 200 mg, Cycle 1, Reason: Provider Judgment) Administration: 200 mg (08/11/2019), 200 mg (09/01/2019), 200 mg (09/22/2019) fosaprepitant (EMEND) 150 mg, dexamethasone (DECADRON) 12 mg in sodium chloride 0.9 % 145 mL IVPB, , Intravenous,  Once, 4 of 4 cycles Administration:  (08/11/2019),  (09/01/2019),  (09/22/2019),  (12/02/2019)  for chemotherapy treatment.    08/26/2019 Genetic Testing   Negative genetic testing. VUS in CHEK2 called c.556A>C identified on the [facility] Common Hereditary Cancers Panel. The Common Hereditary Cancers Panel offered by [facility] includes sequencing and/or deletion duplication testing of the following 48 genes: APC, ATM, AXIN2, BARD1, BMPR1A, BRCA1, BRCA2, BRIP1, CDH1, CDKN2A (p14ARF), CDKN2A (p16INK4a), CKD4, CHEK2, CTNNA1, DICER1, EPCAM (Deletion/duplication testing only), GREM1 (promoter region deletion/duplication testing only), KIT, MEN1, MLH1, MSH2, MSH3, MSH6, MUTYH, NBN, NF1, NHTL1, PALB2, PDGFRA, PMS2, POLD1, POLE, PTEN, RAD50, RAD51C, RAD51D, RNF43, SDHB, SDHC, SDHD, SMAD4, SMARCA4. STK11, TP53, TSC1, TSC2, and VHL.  The following genes were evaluated for sequence changes only: SDHA and HOXB13 c.251G>A variant only. The report date is 08/26/2019.    10/21/2019 Cancer Staging   Staging form: Breast, AJCC 8th Edition - Pathologic stage from 10/21/2019: Stage IIIC (pT2, pN2a, cM0, G3, ER-, PR-, HER2-) - Signed by Sindy Guadeloupe, MD  on 10/27/2019     Interval history- Debra Little, 64 year old  female currently receiving adjuvant weekly Taxol for breast cancer diagnosis, stage IIIc, who presents to symptom management clinic for complaints of new left lower extremity swelling.  She has been bothered by knee pain and wore a knee compression garment to bed last night.  She woke this morning with swelling of the left lower leg extending into the toes.  Swelling has not improved during the day.  Says she is worried of blood clots.  No changes in color or temperature of the skin.  Has chronic varicose veins of the feet.  No pain or discomfort in the leg.  She does not take blood thinners.  No prior history of DVT, SVT, PE.  No shortness of breath, chest palpitations.  ECOG FS:1 - Symptomatic but completely ambulatory  Review of systems- Review of Systems  Constitutional: Negative for chills, fever and malaise/fatigue.  Respiratory: Negative for cough and shortness of breath.   Cardiovascular: Positive for leg swelling (Left). Negative for chest pain and palpitations.  Musculoskeletal: Negative for falls and myalgias.  Skin: Negative for itching and rash.  Neurological: Negative for dizziness, loss of consciousness and weakness.  Psychiatric/Behavioral: Negative for depression. The patient is not nervous/anxious.      Current treatment-Taxol  Allergies  Allergen Reactions  . Amitiza [Lubiprostone] Other (See Comments)    Overly effective at higher dose  . Bentyl [Dicyclomine Hcl]     Lack of effect  . Celecoxib     REACTION: unspecified  . Cortisone Other (See Comments)    flush  . Mobic [Meloxicam]     Palpitations.  But can tolerate aleve  . Nitrofurantoin Monohyd Macro   . Sulfonamide Derivatives Nausea Only  . Zoloft [Sertraline Hcl] Diarrhea and Nausea Only  . Buspar [Buspirone] Palpitations  . Doxycycline Palpitations    chest pain  . Penicillins Rash    Past Medical History:  Diagnosis Date  . Anemia    d/t chemo - last Hgb 10.8  . Anxiety    sees Dr. [name]  .  Breast cancer ([place]) 2020  . Cyst of breast    per [name]  . Dysrhythmia    Hx of palpitations  . Family history of breast cancer   . Family history of colon cancer   . Family history of leukemia   . Family history of ovarian cancer   . Fibromyalgia   . GERD (gastroesophageal reflux disease)   . Heart murmur   . Hemorrhoids   . Herpes, genital 04/2015   confirmed with HSV 2 IgG  . Hiatal hernia   . Hypercholesterolemia    Dr. Kyra Searles  . Hyperlipidemia   . IBS (irritable bowel syndrome)    constipation predominant  . IC (interstitial cystitis)    per [facility]  . Mild depression ([facility])   . MVP (mitral valve prolapse)    [facility] cards eval 2012  . Osteopenia    2010/2017, DEXA at [facility];spine and fem neck  . PONV (postoperative nausea and vomiting)   . Vitamin D deficiency    history of    Past Surgical History:  Procedure Laterality Date  . ABDOMINAL HYSTERECTOMY    . APPENDECTOMY    . BLADDER SURGERY     1980's  . BREAST EXCISIONAL BIOPSY Right    benign  . BREAST EXCISIONAL BIOPSY Bilateral    benign  . COLONOSCOPY  09/2014   Dr. [name]  . COLONOSCOPY  2009  at [facility] 1 POLYP (BENIGN)  . excision of breast cysts     hx of multiple cyst aspirations  . EXPLORATORY LAPAROTOMY    . MASTECTOMY MODIFIED RADICAL Bilateral 10/09/2019   Procedure: RIGHT MASTECTOMY MODIFIED RADICAL AND LEFT TOTAL MASTECTOMY;  Surgeon: Rolm Bookbinder, MD;  Location: [place];  Service: General;  Laterality: Bilateral;  . PORTA CATH INSERTION N/A 08/06/2019   Procedure: PORTA CATH INSERTION;  Surgeon: Katha Cabal, MD;  Location: [place];  Service: Cardiovascular;  Laterality: N/A;  . PORTACATH PLACEMENT Left 08/05/2019   Procedure: INSERTION PORT-A-CATH, Attempted;  Surgeon: Jules Husbands, MD;  Location: [facility];  Service: General;  Laterality: Left;  . TUBAL LIGATION      Social History   Socioeconomic History  . Marital status: Divorced    Spouse name: Not  on file  . Number of children: 1  . Years of education: Not on file  . Highest education level: Not on file  Occupational History  . Occupation: [employer]  Tobacco Use  . Smoking status: Never Smoker  . Smokeless tobacco: Never Used  Substance and Sexual Activity  . Alcohol use: No    Alcohol/week: 0.0 standard drinks  . Drug use: No  . Sexual activity: Not Currently    Birth control/protection: Surgical    Comment: Hysterectomy  Other Topics Concern  . Not on file  Social History Narrative   Married in 26-Feb-1997, divorced as of 02/26/17, 1 son from previous relationship   Brother with MVA at 17, in rest home for many years as of 02-26-18- died of covid recently   Social Determinants of Radio broadcast assistant Strain:   . Difficulty of Paying Living Expenses:   Food Insecurity:   . Worried About Charity fundraiser in the Last Year:   . Arboriculturist in the Last Year:   Transportation Needs:   . Film/video editor (Medical):   [name] Lack of Transportation (Non-Medical):   Physical Activity:   . Days of Exercise per Week:   . Minutes of Exercise per Session:   Stress:   . Feeling of Stress :   Social Connections:   . Frequency of Communication with Friends and Family:   . Frequency of Social Gatherings with Friends and Family:   . Attends Religious Services:   . Active Member of Clubs or Organizations:   . Attends Archivist Meetings:   [name] Marital Status:   Intimate Partner Violence:   . Fear of Current or Ex-Partner:   . Emotionally Abused:   [name] Physically Abused:   . Sexually Abused:     Family History  Problem Relation Age of Onset  . Breast cancer Mother 37  . Hypertension Mother   . Colon cancer Father 66  . Hyperlipidemia Father   . Ovarian cancer Maternal Aunt 80  . Breast cancer Cousin   . Gallbladder disease Paternal Grandmother   . Liver cancer Cousin 70       Malignant  . Cancer Maternal Uncle        unsure type     Current Outpatient  Medications:  .  acetaminophen (TYLENOL) 650 MG CR tablet, Take 650 mg by mouth every 8 (eight) hours as needed for pain., Disp: , Rfl:  .  Ascorbic Acid (VITAMIN C) 100 MG tablet, Take 100 mg by mouth daily., Disp: , Rfl:  .  atorvastatin (LIPITOR) 40 MG tablet, Take 40 mg by mouth at  bedtime. , Disp: , Rfl: 1 .  cholecalciferol (VITAMIN D3) 25 MCG (1000 UT) tablet, Take 1,000 Units by mouth daily. , Disp: , Rfl:  .  ciprofloxacin (CIPRO) 250 MG tablet, Take 1 tablet (250 mg total) by mouth 2 (two) times daily for 7 days., Disp: 14 tablet, Rfl: 0 .  clonazePAM (KLONOPIN) 0.5 MG tablet, Take 0.5 mg by mouth 2 (two) times daily. , Disp: , Rfl:  .  dexamethasone (DECADRON) 4 MG tablet, Take 2 tablets by mouth daily starting the day after Carboplatin and Cytoxan x 3 days. Take with food. (Patient not taking: Reported on 02/16/2020), Disp: 30 tablet, Rfl: 1 .  doxycycline (VIBRA-TABS) 100 MG tablet, Take 100 mg by mouth 2 (two) times daily., Disp: , Rfl:  .  DULoxetine (CYMBALTA) 60 MG capsule, Take 60 mg by mouth daily. , Disp: , Rfl:  .  gabapentin (NEURONTIN) 100 MG capsule, Take 1-2 capsules (100-200 mg total) by mouth 3 (three) times daily as needed., Disp: 90 capsule, Rfl: 1 .  gentamicin ointment (GARAMYCIN) 0.1 %, Apply 1 application topically in the morning, at noon, and at bedtime., Disp: , Rfl:  .  HYDROcodone-acetaminophen (NORCO/VICODIN) 5-325 MG tablet, Take 1 tablet by mouth every 6 (six) hours as needed for moderate pain., Disp: 15 tablet, Rfl: 0 .  lidocaine-prilocaine (EMLA) cream, APPLY TO AFFECTED AREAS ONCE AS DIRECTED, Disp: 30 g, Rfl: 3 .  loratadine (CLARITIN) 10 MG tablet, Take 1 tablet (10 mg total) by mouth daily., Disp: , Rfl:  .  magic mouthwash w/lidocaine SOLN, Take 5 mLs by mouth 4 (four) times daily. 80 ml viscous lidocaine 2%, 80 ml Mylanta, 80 ml Diphenhydramine 12.5 mg/5 ml Elixir, 80 ml Nystatin 100,000 Unit suspension, 80 ml Prednisolone 15 mg/39m, 80 ml Distilled  Water. Sig: Swish/Swallow 5-10 ml four times a day as needed. Dispense 480 ml. 3RFs, Disp: 480 mL, Rfl: 3 .  methocarbamol (ROBAXIN) 500 MG tablet, Take 1 tablet (500 mg total) by mouth every 6 (six) hours as needed for muscle spasms., Disp: 30 tablet, Rfl: 1 .  ondansetron (ZOFRAN) 8 MG tablet, Take 1 tablet (8 mg total) by mouth 2 (two) times daily as needed. Start on the third day after chemotherapy. (Patient not taking: Reported on 02/16/2020), Disp: 30 tablet, Rfl: 1 .  pantoprazole (PROTONIX) 40 MG tablet, TAKE 1 TABLET BY MOUTH ONCE A DAY, Disp: 30 tablet, Rfl: 1 .  Plecanatide (TRULANCE) 3 MG TABS, Take 3 mg by mouth daily. , Disp: , Rfl:  .  prochlorperazine (COMPAZINE) 10 MG tablet, Take 1 tablet (10 mg total) by mouth every 6 (six) hours as needed (Nausea or vomiting)., Disp: 30 tablet, Rfl: 1 .  sennosides-docusate sodium (SENOKOT-S) 8.6-50 MG tablet, Take 1-2 tablets by mouth at bedtime. , Disp: , Rfl:  .  sucralfate (CARAFATE) 1 g tablet, Take 1 g by mouth 3 (three) times daily., Disp: , Rfl:  .  valACYclovir (VALTREX) 500 MG tablet, Take 500 mg by mouth daily., Disp: , Rfl:  .  vitamin B-12 (CYANOCOBALAMIN) 500 MCG tablet, Take 500 mcg by mouth daily., Disp: , Rfl:   Physical exam:  Vitals:   02/20/20 1413  BP: 116/71  Pulse: 93  Resp: 18  Temp: (!) 97 F (36.1 C)  TempSrc: Tympanic  SpO2: 97%  Weight: 157 lb (71.2 kg)   Physical Exam Constitutional:      General: She is not in acute distress.    Comments: Unaccompanied.  Wearing mask.  Musculoskeletal:        General: No deformity or signs of injury.     Right lower leg: No edema.     Left lower leg: Edema (2+ left foot extends to left knee. No discoloration, tenderness, or temperature changes) present.  Skin:    General: Skin is warm and dry.  Neurological:     Mental Status: She is alert.     Gait: Gait normal.  Psychiatric:        Mood and Affect: Mood normal.        Behavior: Behavior normal.      CMP  Latest Ref Rng & Units 02/17/2020  Glucose 70 - 99 mg/dL 113(H)  BUN 8 - 23 mg/dL 14  Creatinine 0.44 - 1.00 mg/dL 0.86  Sodium 135 - 145 mmol/L 138  Potassium 3.5 - 5.1 mmol/L 3.6  Chloride 98 - 111 mmol/L 104  CO2 22 - 32 mmol/L 27  Calcium 8.9 - 10.3 mg/dL 8.6(L)  Total Protein 6.5 - 8.1 g/dL 6.2(L)  Total Bilirubin 0.3 - 1.2 mg/dL 0.4  Alkaline Phos 38 - 126 U/L 50  AST 15 - 41 U/L 19  ALT 0 - 44 U/L 21   CBC Latest Ref Rng & Units 02/17/2020  WBC 4.0 - 10.5 K/uL 5.6  Hemoglobin 12.0 - 15.0 g/dL 10.3(L)  Hematocrit 36.0 - 46.0 % 31.5(L)  Platelets 150 - 400 K/uL 242    No images are attached to the encounter.  No results found.  Assessment and plan- Patient is a 64 y.o. female diagnosed with breast cancer s/p neoadjuvant chemotherapy followed by bilateral mastectomy and targeted node dissection, currently receiving weekly Taxol chemotherapy, who presents to symptom management clinic for acute swelling of left lower leg.   Leg swelling-new problem.  In setting of malignancy recommend ultrasound to rule out thrombosis.  Not currently on blood thinners.  If negative, recommend conservative treatment including ambulation, elevation, compression from toes to mid thigh.  Question if knee compression garment contributed to symptoms. If positive for dvt, will plan for anticoagulation.   Disposition: Stat ultrasound of the left lower extremity to rule out DVT Follow-up based on results.  Otherwise follow-up with Dr. Janese Banks as scheduled   Visit Diagnosis 1. Left leg swelling    Patient expressed understanding and was in agreement with this plan. She also understands that She can call clinic at any time with any questions, concerns, or complaints.   Thank you for allowing me to participate in the care of this very pleasant patient.   Beckey Rutter, DNP, AGNP-C [place] [facility]  CC: Dr. Janese Banks

## 2020-02-24 ENCOUNTER — Inpatient Hospital Stay: Payer: PPO

## 2020-02-24 ENCOUNTER — Inpatient Hospital Stay (HOSPITAL_BASED_OUTPATIENT_CLINIC_OR_DEPARTMENT_OTHER): Payer: PPO | Admitting: Oncology

## 2020-02-24 ENCOUNTER — Other Ambulatory Visit: Payer: Self-pay

## 2020-02-24 VITALS — BP 124/70 | HR 89 | Temp 97.4°F | Resp 16 | Wt 158.0 lb

## 2020-02-24 DIAGNOSIS — Z171 Estrogen receptor negative status [ER-]: Secondary | ICD-10-CM

## 2020-02-24 DIAGNOSIS — C50511 Malignant neoplasm of lower-outer quadrant of right female breast: Secondary | ICD-10-CM | POA: Diagnosis not present

## 2020-02-24 DIAGNOSIS — Z5111 Encounter for antineoplastic chemotherapy: Secondary | ICD-10-CM | POA: Diagnosis not present

## 2020-02-24 DIAGNOSIS — R140 Abdominal distension (gaseous): Secondary | ICD-10-CM | POA: Diagnosis not present

## 2020-02-24 LAB — COMPREHENSIVE METABOLIC PANEL
ALT: 21 U/L (ref 0–44)
AST: 21 U/L (ref 15–41)
Albumin: 3.4 g/dL — ABNORMAL LOW (ref 3.5–5.0)
Alkaline Phosphatase: 51 U/L (ref 38–126)
Anion gap: 8 (ref 5–15)
BUN: 15 mg/dL (ref 8–23)
CO2: 26 mmol/L (ref 22–32)
Calcium: 8.7 mg/dL — ABNORMAL LOW (ref 8.9–10.3)
Chloride: 103 mmol/L (ref 98–111)
Creatinine, Ser: 0.85 mg/dL (ref 0.44–1.00)
GFR calc Af Amer: >60 mL/min (ref >60)
GFR calc non Af Amer: >60 mL/min (ref >60)
Glucose, Bld: 107 mg/dL — ABNORMAL HIGH (ref 70–99)
Potassium: 3.7 mmol/L (ref 3.5–5.1)
Sodium: 137 mmol/L (ref 135–145)
Total Bilirubin: 0.5 mg/dL (ref 0.3–1.2)
Total Protein: 6.2 g/dL — ABNORMAL LOW (ref 6.5–8.1)

## 2020-02-24 LAB — CBC WITH DIFFERENTIAL/PLATELET
Abs Immature Granulocytes: 0.05 10*3/uL (ref 0.00–0.07)
Basophils Absolute: 0 10*3/uL (ref 0.0–0.1)
Basophils Relative: 1 %
Eosinophils Absolute: 0.2 10*3/uL (ref 0.0–0.5)
Eosinophils Relative: 2 %
HCT: 32.5 % — ABNORMAL LOW (ref 36.0–46.0)
Hemoglobin: 10.5 g/dL — ABNORMAL LOW (ref 12.0–15.0)
Immature Granulocytes: 1 %
Lymphocytes Relative: 9 %
Lymphs Abs: 0.6 10*3/uL — ABNORMAL LOW (ref 0.7–4.0)
MCH: 27.9 pg (ref 26.0–34.0)
MCHC: 32.3 g/dL (ref 30.0–36.0)
MCV: 86.4 fL (ref 80.0–100.0)
Monocytes Absolute: 0.8 10*3/uL (ref 0.1–1.0)
Monocytes Relative: 12 %
Neutro Abs: 5.1 10*3/uL (ref 1.7–7.7)
Neutrophils Relative %: 75 %
Platelets: 255 10*3/uL (ref 150–400)
RBC: 3.76 MIL/uL — ABNORMAL LOW (ref 3.87–5.11)
RDW: 16.7 % — ABNORMAL HIGH (ref 11.5–15.5)
WBC: 6.8 10*3/uL (ref 4.0–10.5)
nRBC: 0 % (ref 0.0–0.2)

## 2020-02-24 MED ORDER — FLUCONAZOLE 200 MG PO TABS: 200.0000 mg | ORAL_TABLET | Freq: Every day | ORAL | 0 refills | Status: DC

## 2020-02-24 MED ORDER — SODIUM CHLORIDE 0.9 % IV SOLN
65.0000 mg/m2 | Freq: Once | INTRAVENOUS | Status: AC
  Administered 2020-02-24: 114 mg via INTRAVENOUS
  Filled 2020-02-24: qty 19

## 2020-02-24 MED ORDER — HEPARIN SOD (PORK) LOCK FLUSH 100 UNIT/ML IV SOLN
INTRAVENOUS | Status: AC
  Filled 2020-02-24: qty 5

## 2020-02-24 MED ORDER — HEPARIN SOD (PORK) LOCK FLUSH 100 UNIT/ML IV SOLN
500.0000 [IU] | Freq: Once | INTRAVENOUS | Status: AC
  Filled 2020-02-24: qty 5

## 2020-02-24 MED ORDER — DIPHENHYDRAMINE HCL 50 MG/ML IJ SOLN
50.0000 mg | Freq: Once | INTRAMUSCULAR | Status: AC
  Administered 2020-02-24: 50 mg via INTRAVENOUS
  Filled 2020-02-24: qty 1

## 2020-02-24 MED ORDER — FAMOTIDINE IN NACL 20-0.9 MG/50ML-% IV SOLN
20.0000 mg | Freq: Once | INTRAVENOUS | Status: AC
  Administered 2020-02-24: 20 mg via INTRAVENOUS
  Filled 2020-02-24: qty 50

## 2020-02-24 MED ORDER — SODIUM CHLORIDE 0.9 % IV SOLN
20.0000 mg | Freq: Once | INTRAVENOUS | Status: AC
  Administered 2020-02-24: 20 mg via INTRAVENOUS
  Filled 2020-02-24: qty 20

## 2020-02-24 MED ORDER — SODIUM CHLORIDE 0.9 % IV SOLN
Freq: Once | INTRAVENOUS | Status: AC
  Filled 2020-02-24: qty 250

## 2020-02-24 MED ORDER — HEPARIN SOD (PORK) LOCK FLUSH 100 UNIT/ML IV SOLN
500.0000 [IU] | Freq: Once | INTRAVENOUS | Status: AC | PRN
  Administered 2020-02-24: 500 [IU]
  Filled 2020-02-24: qty 5

## 2020-02-24 MED ORDER — SODIUM CHLORIDE 0.9% FLUSH
10.0000 mL | Freq: Once | INTRAVENOUS | Status: AC
  Administered 2020-02-24: 10 mL via INTRAVENOUS
  Filled 2020-02-24: qty 10

## 2020-02-24 MED ORDER — SODIUM CHLORIDE 0.9% FLUSH
10.0000 mL | INTRAVENOUS | Status: DC | PRN
  Filled 2020-02-24: qty 10

## 2020-02-24 NOTE — Progress Notes (Signed)
Pharmacist Chemotherapy Monitoring - Follow Up Assessment    I verify that I have reviewed each item in the below checklist:  . Regimen for the patient is scheduled for the appropriate day and plan matches scheduled date. [name] Appropriate non-routine labs are ordered dependent on drug ordered. . If applicable, additional medications reviewed and ordered per protocol based on lifetime cumulative doses and/or treatment regimen.   Plan for follow-up and/or issues identified: No . I-vent associated with next due treatment: No . MD and/or nursing notified: No  Debra Little K 02/24/2020 8:52 AM

## 2020-02-24 NOTE — Progress Notes (Signed)
Symptom Management Consult note [facility]  [phone] [phone]  Patient Care Team: Tonia Ghent, MD as PCP - General (Family Medicine) Bary Castilla, Forest Gleason, MD (General Surgery) Modesto Charon, MD (Family Medicine) Nori Riis, PA-C as Physician Assistant (Urology) Teodoro Spray, MD as Consulting Physician (Cardiology) Sindy Guadeloupe, MD as Consulting Physician (Hematology and Oncology)   Name of the patient: Debra Little  [id number]  [birth year]   Date of visit: 02/24/2020   Diagnosis- Triple negative breast cancer   Chief complaint/ Reason for visit- Abdominal bloating/headache  Heme/Onc history:  Oncology History  Breast cancer ([place])  07/30/2019 Initial Diagnosis   Breast cancer ([place])   08/06/2019 Cancer Staging   Staging form: Breast, AJCC 8th Edition - Clinical stage from 08/06/2019: Stage IIIB (cT2, cN1, cM0, G3, ER-, PR-, HER2-) - Signed by Sindy Guadeloupe, MD on 08/07/2019   08/11/2019 -  Chemotherapy   The patient had DOXOrubicin (ADRIAMYCIN) chemo injection 106 mg, 60 mg/m2 = 106 mg, Intravenous,  Once, 4 of 4 cycles Administration: 106 mg (08/11/2019), 106 mg (09/01/2019), 100 mg (09/22/2019), 100 mg (12/02/2019) palonosetron (ALOXI) injection 0.25 mg, 0.25 mg, Intravenous,  Once, 7 of 7 cycles Administration: 0.25 mg (08/11/2019), 0.25 mg (12/16/2019), 0.25 mg (01/06/2020), 0.25 mg (09/01/2019), 0.25 mg (12/23/2019), 0.25 mg (12/30/2019), 0.25 mg (09/22/2019), 0.25 mg (12/02/2019), 0.25 mg (01/20/2020), 0.25 mg (01/27/2020) pegfilgrastim-cbqv (UDENYCA) injection 6 mg, 6 mg, Subcutaneous, Once, 4 of 4 cycles Administration: 6 mg (08/12/2019), 6 mg (09/02/2019), 6 mg (09/23/2019), 6 mg (12/03/2019) cyclophosphamide (CYTOXAN) 1,060 mg in sodium chloride 0.9 % 250 mL chemo infusion, 600 mg/m2 = 1,060 mg, Intravenous,  Once, 4 of 4 cycles Administration: 1,000 mg (09/01/2019), 1,000 mg (09/22/2019), 1,000 mg (12/02/2019) PACLitaxel  (TAXOL) 138 mg in sodium chloride 0.9 % 250 mL chemo infusion (</= 61m/m2), 80 mg/m2 = 138 mg, Intravenous,  Once, 4 of 4 cycles Dose modification: 65 mg/m2 (original dose 80 mg/m2, Cycle 6, Reason: Provider Judgment) Administration: 138 mg (12/16/2019), 138 mg (12/23/2019), 114 mg (01/13/2020), 138 mg (12/30/2019), 114 mg (02/03/2020), 114 mg (01/20/2020), 114 mg (01/27/2020), 114 mg (02/10/2020), 114 mg (02/17/2020), 114 mg (02/24/2020) pembrolizumab (KEYTRUDA) 200 mg in sodium chloride 0.9 % 50 mL chemo infusion, 200 mg (100 % of original dose 200 mg), Intravenous, Once, 3 of 3 cycles Dose modification: 200 mg (original dose 200 mg, Cycle 1, Reason: Provider Judgment) Administration: 200 mg (08/11/2019), 200 mg (09/01/2019), 200 mg (09/22/2019) fosaprepitant (EMEND) 150 mg, dexamethasone (DECADRON) 12 mg in sodium chloride 0.9 % 145 mL IVPB, , Intravenous,  Once, 4 of 4 cycles Administration:  (08/11/2019),  (09/01/2019),  (09/22/2019),  (12/02/2019)  for chemotherapy treatment.    08/26/2019 Genetic Testing   Negative genetic testing. VUS in CHEK2 called c.556A>C identified on the [employer] Common Hereditary Cancers Panel. The Common Hereditary Cancers Panel offered by [employer] includes sequencing and/or deletion duplication testing of the following 48 genes: APC, ATM, AXIN2, BARD1, BMPR1A, BRCA1, BRCA2, BRIP1, CDH1, CDKN2A (p14ARF), CDKN2A (p16INK4a), CKD4, CHEK2, CTNNA1, DICER1, EPCAM (Deletion/duplication testing only), GREM1 (promoter region deletion/duplication testing only), KIT, MEN1, MLH1, MSH2, MSH3, MSH6, MUTYH, NBN, NF1, NHTL1, PALB2, PDGFRA, PMS2, POLD1, POLE, PTEN, RAD50, RAD51C, RAD51D, RNF43, SDHB, SDHC, SDHD, SMAD4, SMARCA4. STK11, TP53, TSC1, TSC2, and VHL.  The following genes were evaluated for sequence changes only: SDHA and HOXB13 c.251G>A variant only. The report date is 08/26/2019.    10/21/2019 Cancer Staging   Staging form: Breast, AJCC 8th  Edition - Pathologic stage from 10/21/2019: Stage IIIC  (pT2, pN2a, cM0, G3, ER-, PR-, HER2-) - Signed by Sindy Guadeloupe, MD on 10/27/2019    Interval history-Mrs. Wachtel is a 64 year old female with past medical history of of MVP, GERD, HLD, anxiety and depression, B12 deficiency and a family history of breast, colon and ovarian cancer who recently presented to [name] for red discoloration and palpable mass prompting a diagnostic mammogram. Mammogram showed a large mass and several abnormal lymph nodes.  Biopsy revealed triple negative breast cancer.  She completed 3 cycles of neoadjuvant AC chemotherapy along with Keytruda.  No significant response noted to her tumor so she proceeded with bilateral mastectomy.  7 lymph nodes were positive for malignancy with extranodal extension.  She is scheduled for 12 total treatment of adjuvant Taxol; she is receiving her 11th cycle today.  Patient has tolerated treatment well.  At her last visit with Dr. Janese Banks she did admit to some tingling and numbness in her hands and feet which appeared to be stable along with ongoing fatigue.  She had new onset dysuria and UA revealed a UTI.  She was treated with Cipro.  Today, patient is scheduled to receive cycle 11 Taxol.  Prior to infusion, patient mentions abdominal fullness and intermittent headaches.  She also complains of vaginal itching likely due to recent urinary tract infection and antibiotic use.  Patient states symptoms started approximately 1 week ago.  Her headache comes and goes. She sees Dr. Tami Ribas for seasonal allergies and symptoms improve with medications.  Abdominal fullness has been present for several weeks now.  She denies any abdominal pain.  Has normal bowel movements.  Last was this morning.  Bloating most profound at bedtime while lying still.  Her appetite is great.  She has gained weight thought to be due to steroids with treatments.  She denies any nausea or vomiting.  She denies chest pain or shortness of breath.   ECOG FS:1 - Symptomatic but  completely ambulatory  Review of systems- Review of Systems  Constitutional: Positive for malaise/fatigue. Negative for chills, fever and weight loss.  HENT: Negative for congestion, ear pain and tinnitus.   Eyes: Negative.  Negative for blurred vision and double vision.  Respiratory: Negative.  Negative for cough, sputum production and shortness of breath.   Cardiovascular: Negative.  Negative for chest pain, palpitations and leg swelling.  Gastrointestinal: Negative.  Negative for abdominal pain, constipation, diarrhea, nausea and vomiting.       Abdominal bloating  Genitourinary: Negative for dysuria, frequency and urgency.  Musculoskeletal: Negative for back pain and falls.  Skin: Negative.  Negative for rash.  Neurological: Negative.  Negative for weakness and headaches.  Endo/Heme/Allergies: Negative.  Does not bruise/bleed easily.  Psychiatric/Behavioral: Negative for depression. The patient is nervous/anxious. The patient does not have insomnia.      Current treatment-adjuvant Taxol x12 cycles.  Allergies  Allergen Reactions  . Amitiza [Lubiprostone] Other (See Comments)    Overly effective at higher dose  . Bentyl [Dicyclomine Hcl]     Lack of effect  . Celecoxib     REACTION: unspecified  . Cortisone Other (See Comments)    flush  . Mobic [Meloxicam]     Palpitations.  But can tolerate aleve  . Nitrofurantoin Monohyd Macro   . Sulfonamide Derivatives Nausea Only  . Zoloft [Sertraline Hcl] Diarrhea and Nausea Only  . Buspar [Buspirone] Palpitations  . Doxycycline Palpitations    chest pain  . Penicillins Rash  Past Medical History:  Diagnosis Date  . Anemia    d/t chemo - last Hgb 10.8  . Anxiety    sees Dr. [name]  . Breast cancer ([place]) February 11, 2019  . Cyst of breast    per [name]  . Dysrhythmia    Hx of palpitations  . Family history of breast cancer   . Family history of colon cancer   . Family history of leukemia   . Family history of ovarian  cancer   . Fibromyalgia   . GERD (gastroesophageal reflux disease)   . Heart murmur   . Hemorrhoids   . Herpes, genital 04/2015   confirmed with HSV 2 IgG  . Hiatal hernia   . Hypercholesterolemia    Dr. Kyra Searles  . Hyperlipidemia   . IBS (irritable bowel syndrome)    constipation predominant  . IC (interstitial cystitis)    per [facility]  . Mild depression ([place])   . MVP (mitral valve prolapse)    [facility] cards eval Feb 11, 2011  . Osteopenia    2010/2017, DEXA at [facility];spine and fem neck  . PONV (postoperative nausea and vomiting)   . Vitamin D deficiency    history of     Past Surgical History:  Procedure Laterality Date  . ABDOMINAL HYSTERECTOMY    . APPENDECTOMY    . BLADDER SURGERY     1980's  . BREAST EXCISIONAL BIOPSY Right    benign  . BREAST EXCISIONAL BIOPSY Bilateral    benign  . COLONOSCOPY  09/2014   Dr. [name]  . COLONOSCOPY  02-11-2008   at [facility] 1 POLYP (BENIGN)  . excision of breast cysts     hx of multiple cyst aspirations  . EXPLORATORY LAPAROTOMY    . MASTECTOMY MODIFIED RADICAL Bilateral 10/09/2019   Procedure: RIGHT MASTECTOMY MODIFIED RADICAL AND LEFT TOTAL MASTECTOMY;  Surgeon: Rolm Bookbinder, MD;  Location: [facility];  Service: General;  Laterality: Bilateral;  . PORTA CATH INSERTION N/A 08/06/2019   Procedure: PORTA CATH INSERTION;  Surgeon: Katha Cabal, MD;  Location: [facility];  Service: Cardiovascular;  Laterality: N/A;  . PORTACATH PLACEMENT Left 08/05/2019   Procedure: INSERTION PORT-A-CATH, Attempted;  Surgeon: Jules Husbands, MD;  Location: [facility];  Service: General;  Laterality: Left;  . TUBAL LIGATION      Social History   Socioeconomic History  . Marital status: Divorced    Spouse name: Not on file  . Number of children: 1  . Years of education: Not on file  . Highest education level: Not on file  Occupational History  . Occupation: [employer]  Tobacco Use  . Smoking status: Never Smoker  . Smokeless tobacco: Never  Used  Substance and Sexual Activity  . Alcohol use: No    Alcohol/week: 0.0 standard drinks  . Drug use: No  . Sexual activity: Not Currently    Birth control/protection: Surgical    Comment: Hysterectomy  Other Topics Concern  . Not on file  Social History Narrative   Married in 02-10-97, divorced as of 10-Feb-2017, 1 son from previous relationship   Brother with MVA at 63, in rest home for many years as of 10-Feb-2018- died of covid recently   Social Determinants of Radio broadcast assistant Strain:   . Difficulty of Paying Living Expenses:   Food Insecurity:   . Worried About Charity fundraiser in the Last Year:   . Arboriculturist in the Last Year:   [employer]  Needs:   . Lack of Transportation (Medical):   [name] Lack of Transportation (Non-Medical):   Physical Activity:   . Days of Exercise per Week:   . Minutes of Exercise per Session:   Stress:   . Feeling of Stress :   Social Connections:   . Frequency of Communication with Friends and Family:   . Frequency of Social Gatherings with Friends and Family:   . Attends Religious Services:   . Active Member of Clubs or Organizations:   . Attends Archivist Meetings:   [name] Marital Status:   Intimate Partner Violence:   . Fear of Current or Ex-Partner:   . Emotionally Abused:   [name] Physically Abused:   . Sexually Abused:     Family History  Problem Relation Age of Onset  . Breast cancer Mother 38  . Hypertension Mother   . Colon cancer Father 49  . Hyperlipidemia Father   . Ovarian cancer Maternal Aunt 80  . Breast cancer Cousin   . Gallbladder disease Paternal Grandmother   . Liver cancer Cousin 70       Malignant  . Cancer Maternal Uncle        unsure type     Current Outpatient Medications:  .  acetaminophen (TYLENOL) 650 MG CR tablet, Take 650 mg by mouth every 8 (eight) hours as needed for pain., Disp: , Rfl:  .  Ascorbic Acid (VITAMIN C) 100 MG tablet, Take 100 mg by mouth daily., Disp: , Rfl:  .   atorvastatin (LIPITOR) 40 MG tablet, Take 40 mg by mouth at bedtime. , Disp: , Rfl: 1 .  cholecalciferol (VITAMIN D3) 25 MCG (1000 UT) tablet, Take 1,000 Units by mouth daily. , Disp: , Rfl:  .  ciprofloxacin (CIPRO) 250 MG tablet, Take 1 tablet (250 mg total) by mouth 2 (two) times daily for 7 days., Disp: 14 tablet, Rfl: 0 .  clonazePAM (KLONOPIN) 0.5 MG tablet, Take 0.5 mg by mouth 2 (two) times daily. , Disp: , Rfl:  .  dexamethasone (DECADRON) 4 MG tablet, Take 2 tablets by mouth daily starting the day after Carboplatin and Cytoxan x 3 days. Take with food. (Patient not taking: Reported on 02/16/2020), Disp: 30 tablet, Rfl: 1 .  doxycycline (VIBRA-TABS) 100 MG tablet, Take 100 mg by mouth 2 (two) times daily., Disp: , Rfl:  .  DULoxetine (CYMBALTA) 60 MG capsule, Take 60 mg by mouth daily. , Disp: , Rfl:  .  fluconazole (DIFLUCAN) 200 MG tablet, Take 1 tablet (200 mg total) by mouth daily., Disp: 1 tablet, Rfl: 0 .  gabapentin (NEURONTIN) 100 MG capsule, Take 1-2 capsules (100-200 mg total) by mouth 3 (three) times daily as needed., Disp: 90 capsule, Rfl: 1 .  gentamicin ointment (GARAMYCIN) 0.1 %, Apply 1 application topically in the morning, at noon, and at bedtime., Disp: , Rfl:  .  HYDROcodone-acetaminophen (NORCO/VICODIN) 5-325 MG tablet, Take 1 tablet by mouth every 6 (six) hours as needed for moderate pain., Disp: 15 tablet, Rfl: 0 .  lidocaine-prilocaine (EMLA) cream, APPLY TO AFFECTED AREAS ONCE AS DIRECTED, Disp: 30 g, Rfl: 3 .  loratadine (CLARITIN) 10 MG tablet, Take 1 tablet (10 mg total) by mouth daily., Disp: , Rfl:  .  magic mouthwash w/lidocaine SOLN, Take 5 mLs by mouth 4 (four) times daily. 80 ml viscous lidocaine 2%, 80 ml Mylanta, 80 ml Diphenhydramine 12.5 mg/5 ml Elixir, 80 ml Nystatin 100,000 Unit suspension, 80 ml Prednisolone 15 mg/78m, 80 ml Distilled  Water. Sig: Swish/Swallow 5-10 ml four times a day as needed. Dispense 480 ml. 3RFs, Disp: 480 mL, Rfl: 3 .  methocarbamol  (ROBAXIN) 500 MG tablet, Take 1 tablet (500 mg total) by mouth every 6 (six) hours as needed for muscle spasms., Disp: 30 tablet, Rfl: 1 .  ondansetron (ZOFRAN) 8 MG tablet, Take 1 tablet (8 mg total) by mouth 2 (two) times daily as needed. Start on the third day after chemotherapy. (Patient not taking: Reported on 02/16/2020), Disp: 30 tablet, Rfl: 1 .  pantoprazole (PROTONIX) 40 MG tablet, TAKE 1 TABLET BY MOUTH ONCE A DAY, Disp: 30 tablet, Rfl: 1 .  Plecanatide (TRULANCE) 3 MG TABS, Take 3 mg by mouth daily. , Disp: , Rfl:  .  prochlorperazine (COMPAZINE) 10 MG tablet, Take 1 tablet (10 mg total) by mouth every 6 (six) hours as needed (Nausea or vomiting)., Disp: 30 tablet, Rfl: 1 .  sennosides-docusate sodium (SENOKOT-S) 8.6-50 MG tablet, Take 1-2 tablets by mouth at bedtime. , Disp: , Rfl:  .  sucralfate (CARAFATE) 1 g tablet, Take 1 g by mouth 3 (three) times daily., Disp: , Rfl:  .  valACYclovir (VALTREX) 500 MG tablet, Take 500 mg by mouth daily., Disp: , Rfl:  .  vitamin B-12 (CYANOCOBALAMIN) 500 MCG tablet, Take 500 mcg by mouth daily., Disp: , Rfl:  No current facility-administered medications for this visit.  Facility-Administered Medications Ordered in Other Visits:  .  sodium chloride flush (NS) 0.9 % injection 10 mL, 10 mL, Intracatheter, PRN, Sindy Guadeloupe, MD  Physical exam: There were no vitals filed for this visit. Physical Exam Vitals and nursing note reviewed.  Constitutional:      Appearance: Normal appearance.  HENT:     Head: Normocephalic and atraumatic.  Eyes:     Pupils: Pupils are equal, round, and reactive to light.  Cardiovascular:     Rate and Rhythm: Normal rate and regular rhythm.     Heart sounds: Normal heart sounds. No murmur.  Pulmonary:     Effort: Pulmonary effort is normal.     Breath sounds: Normal breath sounds. No wheezing.  Abdominal:     General: Bowel sounds are normal. There is no distension.     Palpations: Abdomen is soft.      Tenderness: There is no abdominal tenderness.  Musculoskeletal:        General: Normal range of motion.     Cervical back: Normal range of motion.  Skin:    General: Skin is warm and dry.     Findings: No rash.  Neurological:     Mental Status: She is alert and oriented to person, place, and time.  Psychiatric:        Judgment: Judgment normal.      CMP Latest Ref Rng & Units 02/24/2020  Glucose 70 - 99 mg/dL 107(H)  BUN 8 - 23 mg/dL 15  Creatinine 0.44 - 1.00 mg/dL 0.85  Sodium 135 - 145 mmol/L 137  Potassium 3.5 - 5.1 mmol/L 3.7  Chloride 98 - 111 mmol/L 103  CO2 22 - 32 mmol/L 26  Calcium 8.9 - 10.3 mg/dL 8.7(L)  Total Protein 6.5 - 8.1 g/dL 6.2(L)  Total Bilirubin 0.3 - 1.2 mg/dL 0.5  Alkaline Phos 38 - 126 U/L 51  AST 15 - 41 U/L 21  ALT 0 - 44 U/L 21   CBC Latest Ref Rng & Units 02/24/2020  WBC 4.0 - 10.5 K/uL 6.8  Hemoglobin 12.0 - 15.0 g/dL 10.5(L)  Hematocrit 36.0 - 46.0 % 32.5(L)  Platelets 150 - 400 K/uL 255    No images are attached to the encounter.  US Venous Img Lower Unilateral Left  Result Date: 02/20/2020 CLINICAL DATA:  Acute pain EXAM: LEFT LOWER EXTREMITY VENOUS DOPPLER ULTRASOUND TECHNIQUE: Gray-scale sonography with graded compression, as well as color Doppler and duplex ultrasound were performed to evaluate the lower extremity deep venous systems from the level of the common femoral vein and including the common femoral, femoral, profunda femoral, popliteal and calf veins including the posterior tibial, peroneal and gastrocnemius veins when visible. The superficial great saphenous vein was also interrogated. Spectral Doppler was utilized to evaluate flow at rest and with distal augmentation maneuvers in the common femoral, femoral and popliteal veins. COMPARISON:  None. FINDINGS: Contralateral Common Femoral Vein: Respiratory phasicity is normal and symmetric with the symptomatic side. No evidence of thrombus. Normal compressibility. Common Femoral Vein:  No evidence of thrombus. Normal compressibility, respiratory phasicity and response to augmentation. Saphenofemoral Junction: No evidence of thrombus. Normal compressibility and flow on color Doppler imaging. Profunda Femoral Vein: No evidence of thrombus. Normal compressibility and flow on color Doppler imaging. Femoral Vein: No evidence of thrombus. Normal compressibility, respiratory phasicity and response to augmentation. Popliteal Vein: No evidence of thrombus. Normal compressibility, respiratory phasicity and response to augmentation. Calf Veins: No evidence of thrombus. Normal compressibility and flow on color Doppler imaging. Superficial Great Saphenous Vein: No evidence of thrombus. Normal compressibility. Venous Reflux:  None. Other Findings:  None. IMPRESSION: No evidence of deep venous thrombosis. Electronically Signed   By: Constance Holster M.D.   On: 02/20/2020 16:52    Assessment and plan- Patient is a 64 y.o. female who presents to symptom management for complaints of intermittent headaches, abdominal bloating and vaginal itching.   Unclear etiology.  On assessment patient's abdomen is soft and nontender.  Per Dr. Elroy Channel previous notes she is scheduled for repeat imaging with a CT chest/abdomen and pelvis at the end of this month for restaging given triple negative disease with several lymph nodes invovled.  It would be reasonable to move this up given symptoms have been present for several weeks now.  Dr. Janese Banks in agreement.  Yeast infection: Recently treated for urinary tract infection with Cipro.  Patient would like fluconazole for vaginal itching.  Prescription sent to [place].  Headaches: Likely related to seasonal allergies.  She sees Dr. Tami Ribas and when she takes her medications symptoms resolved.  Disposition: Reschedule CT abdomen/chest for next available. RX diflucan 200 mg X 1 tab for yeast infection RTC as scheduled for f/u    Visit Diagnosis 1. Malignant  neoplasm of lower-outer quadrant of right breast of female, estrogen receptor negative ([place])   2. Abdominal bloating     Patient expressed understanding and was in agreement with this plan. She also understands that She can call clinic at any time with any questions, concerns, or complaints.   Greater than 50% was spent in counseling and coordination of care with this patient including but not limited to discussion of the relevant topics above (See A&P) including, but not limited to diagnosis and management of acute and chronic medical conditions.   Thank you for allowing me to participate in the care of this very pleasant patient.    Jacquelin Hawking, NP [place] at [facility] Cell - [phone] Pager- [phone] 02/24/2020 3:05 PM

## 2020-02-24 NOTE — Progress Notes (Signed)
Patient c/o left temporal headache, bloating, and generally not feeling well. Dr. Janese Banks informed and suggested patient see symptom management. Debra Casa, NP assessed patient and discussed symptoms with Dr. Janese Banks. Patient okay to proceed with treatment today per Debra Casa, NP.

## 2020-02-25 ENCOUNTER — Telehealth: Payer: Self-pay | Admitting: Hematology and Oncology

## 2020-02-25 ENCOUNTER — Ambulatory Visit
Admission: RE | Admit: 2020-02-25 | Discharge: 2020-02-25 | Disposition: A | Discharge status: Home / Self Care | Payer: PPO | Source: Ambulatory Visit | Attending: Oncology | Admitting: Oncology

## 2020-02-25 ENCOUNTER — Other Ambulatory Visit: Payer: Self-pay

## 2020-02-25 DIAGNOSIS — Z171 Estrogen receptor negative status [ER-]: Secondary | ICD-10-CM | POA: Diagnosis not present

## 2020-02-25 DIAGNOSIS — C50511 Malignant neoplasm of lower-outer quadrant of right female breast: Secondary | ICD-10-CM

## 2020-02-25 DIAGNOSIS — C50919 Malignant neoplasm of unspecified site of unspecified female breast: Secondary | ICD-10-CM | POA: Diagnosis not present

## 2020-02-25 MED ORDER — IOHEXOL 300 MG/ML  SOLN
100.0000 mL | Freq: Once | INTRAMUSCULAR | Status: AC | PRN
  Administered 2020-02-25: 100 mL via INTRAVENOUS

## 2020-02-25 NOTE — Telephone Encounter (Signed)
Re:  Reflux  Patient called regarding worsening reflux.  She takes Protonix daily.  Reflux aggravated s/p CT scan with contrast today.  Discuss various treatment options (Pecid, Carafate, Mylanta) as well as avoiding caffeine and chocolate.  Currently she is asymptomatic.  Multiple questions were asked and answered.   Lequita Asal, MD

## 2020-02-26 DIAGNOSIS — R350 Frequency of micturition: Secondary | ICD-10-CM | POA: Diagnosis not present

## 2020-03-01 ENCOUNTER — Encounter: Payer: Self-pay | Admitting: Oncology

## 2020-03-01 NOTE — Progress Notes (Signed)
Patient is coming in for follow up, mentions her left knee bothers her and leg feels heavy. Patient had ct scan last week and has not heard back about results.

## 2020-03-02 ENCOUNTER — Inpatient Hospital Stay (HOSPITAL_BASED_OUTPATIENT_CLINIC_OR_DEPARTMENT_OTHER): Payer: PPO | Admitting: Oncology

## 2020-03-02 ENCOUNTER — Other Ambulatory Visit: Payer: Self-pay

## 2020-03-02 ENCOUNTER — Encounter: Payer: Self-pay | Admitting: Oncology

## 2020-03-02 ENCOUNTER — Inpatient Hospital Stay: Payer: PPO

## 2020-03-02 ENCOUNTER — Inpatient Hospital Stay: Payer: PPO | Attending: Oncology

## 2020-03-02 VITALS — BP 135/74 | HR 83 | Resp 18

## 2020-03-02 VITALS — BP 105/59 | HR 80 | Temp 97.0°F | Resp 18 | Wt 156.1 lb

## 2020-03-02 DIAGNOSIS — Z7189 Other specified counseling: Secondary | ICD-10-CM | POA: Diagnosis not present

## 2020-03-02 DIAGNOSIS — T451X5A Adverse effect of antineoplastic and immunosuppressive drugs, initial encounter: Secondary | ICD-10-CM | POA: Insufficient documentation

## 2020-03-02 DIAGNOSIS — Z170 Estrogen receptor positive status [ER+]: Secondary | ICD-10-CM | POA: Diagnosis not present

## 2020-03-02 DIAGNOSIS — C50511 Malignant neoplasm of lower-outer quadrant of right female breast: Secondary | ICD-10-CM

## 2020-03-02 DIAGNOSIS — R5383 Other fatigue: Secondary | ICD-10-CM | POA: Diagnosis not present

## 2020-03-02 DIAGNOSIS — Z79899 Other long term (current) drug therapy: Secondary | ICD-10-CM | POA: Insufficient documentation

## 2020-03-02 DIAGNOSIS — D0512 Intraductal carcinoma in situ of left breast: Secondary | ICD-10-CM | POA: Diagnosis not present

## 2020-03-02 DIAGNOSIS — Z5111 Encounter for antineoplastic chemotherapy: Secondary | ICD-10-CM | POA: Diagnosis not present

## 2020-03-02 DIAGNOSIS — E785 Hyperlipidemia, unspecified: Secondary | ICD-10-CM | POA: Insufficient documentation

## 2020-03-02 DIAGNOSIS — Z803 Family history of malignant neoplasm of breast: Secondary | ICD-10-CM | POA: Insufficient documentation

## 2020-03-02 DIAGNOSIS — Z800 Family history of malignant neoplasm of digestive organs: Secondary | ICD-10-CM | POA: Insufficient documentation

## 2020-03-02 DIAGNOSIS — R5381 Other malaise: Secondary | ICD-10-CM | POA: Insufficient documentation

## 2020-03-02 DIAGNOSIS — G620 Drug-induced polyneuropathy: Secondary | ICD-10-CM

## 2020-03-02 DIAGNOSIS — Z7952 Long term (current) use of systemic steroids: Secondary | ICD-10-CM | POA: Insufficient documentation

## 2020-03-02 DIAGNOSIS — Z8041 Family history of malignant neoplasm of ovary: Secondary | ICD-10-CM | POA: Diagnosis not present

## 2020-03-02 DIAGNOSIS — F419 Anxiety disorder, unspecified: Secondary | ICD-10-CM | POA: Diagnosis not present

## 2020-03-02 DIAGNOSIS — Z171 Estrogen receptor negative status [ER-]: Secondary | ICD-10-CM

## 2020-03-02 LAB — CBC WITH DIFFERENTIAL/PLATELET
Abs Immature Granulocytes: 0.07 10*3/uL (ref 0.00–0.07)
Basophils Absolute: 0 10*3/uL (ref 0.0–0.1)
Basophils Relative: 1 %
Eosinophils Absolute: 0.1 10*3/uL (ref 0.0–0.5)
Eosinophils Relative: 2 %
HCT: 32.5 % — ABNORMAL LOW (ref 36.0–46.0)
Hemoglobin: 10.6 g/dL — ABNORMAL LOW (ref 12.0–15.0)
Immature Granulocytes: 1 %
Lymphocytes Relative: 8 %
Lymphs Abs: 0.6 10*3/uL — ABNORMAL LOW (ref 0.7–4.0)
MCH: 28.4 pg (ref 26.0–34.0)
MCHC: 32.6 g/dL (ref 30.0–36.0)
MCV: 87.1 fL (ref 80.0–100.0)
Monocytes Absolute: 0.7 10*3/uL (ref 0.1–1.0)
Monocytes Relative: 9 %
Neutro Abs: 6 10*3/uL (ref 1.7–7.7)
Neutrophils Relative %: 79 %
Platelets: 258 10*3/uL (ref 150–400)
RBC: 3.73 MIL/uL — ABNORMAL LOW (ref 3.87–5.11)
RDW: 16.7 % — ABNORMAL HIGH (ref 11.5–15.5)
WBC: 7.5 10*3/uL (ref 4.0–10.5)
nRBC: 0 % (ref 0.0–0.2)

## 2020-03-02 LAB — COMPREHENSIVE METABOLIC PANEL
ALT: 23 U/L (ref 0–44)
AST: 20 U/L (ref 15–41)
Albumin: 3.6 g/dL (ref 3.5–5.0)
Alkaline Phosphatase: 52 U/L (ref 38–126)
Anion gap: 8 (ref 5–15)
BUN: 14 mg/dL (ref 8–23)
CO2: 26 mmol/L (ref 22–32)
Calcium: 8.7 mg/dL — ABNORMAL LOW (ref 8.9–10.3)
Chloride: 103 mmol/L (ref 98–111)
Creatinine, Ser: 0.77 mg/dL (ref 0.44–1.00)
GFR calc Af Amer: >60 mL/min (ref >60)
GFR calc non Af Amer: >60 mL/min (ref >60)
Glucose, Bld: 98 mg/dL (ref 70–99)
Potassium: 3.7 mmol/L (ref 3.5–5.1)
Sodium: 137 mmol/L (ref 135–145)
Total Bilirubin: 0.7 mg/dL (ref 0.3–1.2)
Total Protein: 6.5 g/dL (ref 6.5–8.1)

## 2020-03-02 MED ORDER — FAMOTIDINE IN NACL 20-0.9 MG/50ML-% IV SOLN
20.0000 mg | Freq: Once | INTRAVENOUS | Status: AC
  Administered 2020-03-02: 20 mg via INTRAVENOUS
  Filled 2020-03-02: qty 50

## 2020-03-02 MED ORDER — SODIUM CHLORIDE 0.9 % IV SOLN
65.0000 mg/m2 | Freq: Once | INTRAVENOUS | Status: AC
  Administered 2020-03-02: 114 mg via INTRAVENOUS
  Filled 2020-03-02: qty 19

## 2020-03-02 MED ORDER — SODIUM CHLORIDE 0.9 % IV SOLN
Freq: Once | INTRAVENOUS | Status: AC
  Filled 2020-03-02: qty 250

## 2020-03-02 MED ORDER — HEPARIN SOD (PORK) LOCK FLUSH 100 UNIT/ML IV SOLN
500.0000 [IU] | Freq: Once | INTRAVENOUS | Status: AC
  Administered 2020-03-02: 500 [IU] via INTRAVENOUS
  Filled 2020-03-02: qty 5

## 2020-03-02 MED ORDER — HEPARIN SOD (PORK) LOCK FLUSH 100 UNIT/ML IV SOLN
INTRAVENOUS | Status: AC
  Filled 2020-03-02: qty 5

## 2020-03-02 MED ORDER — SODIUM CHLORIDE 0.9 % IV SOLN
20.0000 mg | Freq: Once | INTRAVENOUS | Status: AC
  Administered 2020-03-02: 20 mg via INTRAVENOUS
  Filled 2020-03-02: qty 2

## 2020-03-02 MED ORDER — SODIUM CHLORIDE 0.9% FLUSH
10.0000 mL | INTRAVENOUS | Status: DC | PRN
  Administered 2020-03-02: 10 mL via INTRAVENOUS
  Filled 2020-03-02: qty 10

## 2020-03-02 MED ORDER — LETROZOLE 2.5 MG PO TABS: 2.5000 mg | ORAL_TABLET | Freq: Every day | ORAL | 3 refills | Status: DC

## 2020-03-02 MED ORDER — DIPHENHYDRAMINE HCL 50 MG/ML IJ SOLN
50.0000 mg | Freq: Once | INTRAMUSCULAR | Status: AC
  Administered 2020-03-02: 50 mg via INTRAVENOUS
  Filled 2020-03-02: qty 1

## 2020-03-02 NOTE — Patient Instructions (Signed)
Please take Calcium 1200 MG and Vitamin D 800 units daily. This could be bought at your local pharmacy.   Letrozole: Patient drug information   Access [employer] for additional drug information, tools, and databases.  Copyright 857 [id number] [employer] All rights reserved.  (For additional information see "Letrozole: Drug information" and see "Letrozole: Pediatric drug information") Brand Names: [place]  Femara Brand Names: [place]  ACH-Letrozole;  APO-Letrozole;  BIO-Letrozole;  CCP-Letrozole;  Femara;  JAMP-Letrozole;  Mar-Letrozole;  MED-Letrozole;  NAT-Letrozole;  PMS-Letrozole;  RAN-Letrozole;  RIVA-Letrozole;  SANDOZ Letrozole;  TEVA-Letrozole;  VAN-Letrozole [DSC];  Zinda-Letrozole What is this drug used for?  . It is used to treat breast cancer in women after menopause (change of life).  . It may be given to you for other reasons. Talk with the doctor. What do I need to tell my doctor BEFORE I take this drug?  . If you have an allergy to letrozole or any other part of this drug.  . If you are allergic to this drug; any part of this drug; or any other drugs, foods, or substances. Tell your doctor about the allergy and what signs you had.  . If you are pregnant or may be pregnant. Do not take this drug if you are pregnant.  . If you are breast-feeding. Do not breast-feed while you take this drug and for 3 weeks after your last dose.  This is not a list of all drugs or health problems that interact with this drug.  Tell your doctor and pharmacist about all of your drugs (prescription or OTC, natural products, vitamins) and health problems. You must check to make sure that it is safe for you to take this drug with all of your drugs and health problems. Do not start, stop, or change the dose of any drug without checking with your doctor. What are some things I need to know or do while I take this drug?  . Tell all of your health care providers that you take this drug. This  includes your doctors, nurses, pharmacists, and dentists.  . Avoid driving and doing other tasks or actions that call for you to be alert until you see how this drug affects you.  . This drug may cause weak bones. This may happen more often if used for a long time. This may raise the chance of broken bones. Call your doctor right away if you have bone pain.  . Have a bone density test as you have been told by your doctor. Talk with your doctor.  . Have blood work checked as you have been told by the doctor. Talk with the doctor.  . Take calcium and vitamin D as you were told by your doctor.  . If you are 61 or older, use this drug with care. You could have more side effects.  . This drug may affect fertility. Fertility problems may lead to not being able to get pregnant or father a child.  . This drug may cause harm to the unborn baby if you take it while you are pregnant.  . If you are able to get pregnant, a pregnancy test will be done to show that you are NOT pregnant before starting this drug. Talk with your doctor.  . Use birth control that you can trust to prevent pregnancy while taking this drug and for at least 3 weeks after your last dose.  . If you get pregnant while taking this drug or within 3 weeks after your  last dose, call your doctor right away. What are some side effects that I need to call my doctor about right away?  WARNING/CAUTION: Even though it may be rare, some people may have very bad and sometimes deadly side effects when taking a drug. Tell your doctor or get medical help right away if you have any of the following signs or symptoms that may be related to a very bad side effect:  . Signs of an allergic reaction, like rash; hives; itching; red, swollen, blistered, or peeling skin with or without fever; wheezing; tightness in the chest or throat; trouble breathing, swallowing, or talking; unusual hoarseness; or swelling of the mouth, face, lips, tongue, or throat.  . Signs of  high blood pressure like very bad headache or dizziness, passing out, or change in eyesight.  . Signs of a urinary tract infection (UTI) like blood in the urine, burning or pain when passing urine, feeling the need to pass urine often or right away, fever, lower stomach pain, or pelvic pain.  [name] Swollen gland.  . Chest pain or pressure.  . Shortness of breath.  . Vaginal bleeding that is not normal.  . Low mood (depression).  . Swelling.  . Not able to pass urine or change in how much urine is passed. What are some other side effects of this drug?  All drugs may cause side effects. However, many people have no side effects or only have minor side effects. Call your doctor or get medical help if any of these side effects or any other side effects bother you or do not go away:  [name] Feeling tired or weak.  [name] Hot flashes.  . Headache.  . Dizziness.  [name] Upset stomach or throwing up.  . Cough.  . Back pain.  . Muscle or joint pain.  . Constipation.  . Diarrhea.  . Night sweats.  . Sweating a lot.  . Not able to sleep.  . Weight gain or loss.  . Belly pain.  . Hair loss.  These are not all of the side effects that may occur. If you have questions about side effects, call your doctor. Call your doctor for medical advice about side effects.  You may report side effects to your national health agency. How is this drug best taken?  Use this drug as ordered by your doctor. Read all information given to you. Follow all instructions closely.  . Take with or without food.  [name] Keep taking this drug as you have been told by your doctor or other health care provider, even if you feel well.  . Take this drug at the same time of day. What do I do if I miss a dose?  [name] Take a missed dose as soon as you think about it.  . If it is close to the time for your next dose, skip the missed dose and go back to your normal time.  . Do not take 2 doses at the same time or extra doses. How do I store and/or throw  out this drug?  . Store at room temperature.  . Store in a dry place. Do not store in a bathroom.  [name] Keep all drugs in a safe place. Keep all drugs out of the reach of children and pets.  . Throw away unused or expired drugs. Do not flush down a toilet or pour down a drain unless you are told to do so. Check with your pharmacist if you have questions about  the best way to throw out drugs. There may be drug take-back programs in your area. General drug facts  . If your symptoms or health problems do not get better or if they become worse, call your doctor.  . Do not share your drugs with others and do not take anyone else's drugs.  . Some drugs may have another patient information leaflet. If you have any questions about this drug, please talk with your doctor, nurse, pharmacist, or other health care provider.  . If you think there has been an overdose, call your poison control center or get medical care right away. Be ready to tell or show what was taken, how much, and when it happened.

## 2020-03-03 ENCOUNTER — Ambulatory Visit
Admission: RE | Admit: 2020-03-03 | Discharge: 2020-03-03 | Disposition: A | Discharge status: Home / Self Care | Payer: PPO | Source: Ambulatory Visit | Attending: Radiation Oncology | Admitting: Radiation Oncology

## 2020-03-03 ENCOUNTER — Other Ambulatory Visit: Payer: Self-pay

## 2020-03-03 ENCOUNTER — Encounter: Payer: Self-pay | Admitting: Radiation Oncology

## 2020-03-03 VITALS — BP 135/67 | HR 78 | Temp 97.1°F | Resp 18 | Wt 157.5 lb

## 2020-03-03 DIAGNOSIS — Z171 Estrogen receptor negative status [ER-]: Secondary | ICD-10-CM | POA: Insufficient documentation

## 2020-03-03 DIAGNOSIS — C50311 Malignant neoplasm of lower-inner quadrant of right female breast: Secondary | ICD-10-CM | POA: Diagnosis not present

## 2020-03-03 DIAGNOSIS — Z79899 Other long term (current) drug therapy: Secondary | ICD-10-CM | POA: Insufficient documentation

## 2020-03-03 DIAGNOSIS — N3010 Interstitial cystitis (chronic) without hematuria: Secondary | ICD-10-CM | POA: Diagnosis not present

## 2020-03-03 DIAGNOSIS — Z8041 Family history of malignant neoplasm of ovary: Secondary | ICD-10-CM | POA: Diagnosis not present

## 2020-03-03 DIAGNOSIS — Z800 Family history of malignant neoplasm of digestive organs: Secondary | ICD-10-CM | POA: Insufficient documentation

## 2020-03-03 DIAGNOSIS — Z803 Family history of malignant neoplasm of breast: Secondary | ICD-10-CM | POA: Insufficient documentation

## 2020-03-03 DIAGNOSIS — Z806 Family history of leukemia: Secondary | ICD-10-CM | POA: Diagnosis not present

## 2020-03-03 DIAGNOSIS — D649 Anemia, unspecified: Secondary | ICD-10-CM | POA: Insufficient documentation

## 2020-03-03 DIAGNOSIS — M797 Fibromyalgia: Secondary | ICD-10-CM | POA: Diagnosis not present

## 2020-03-03 DIAGNOSIS — F419 Anxiety disorder, unspecified: Secondary | ICD-10-CM | POA: Insufficient documentation

## 2020-03-03 DIAGNOSIS — K589 Irritable bowel syndrome without diarrhea: Secondary | ICD-10-CM | POA: Insufficient documentation

## 2020-03-03 DIAGNOSIS — K219 Gastro-esophageal reflux disease without esophagitis: Secondary | ICD-10-CM | POA: Diagnosis not present

## 2020-03-03 DIAGNOSIS — G629 Polyneuropathy, unspecified: Secondary | ICD-10-CM | POA: Diagnosis not present

## 2020-03-03 DIAGNOSIS — C50511 Malignant neoplasm of lower-outer quadrant of right female breast: Secondary | ICD-10-CM

## 2020-03-03 NOTE — Consult Note (Signed)
NEW PATIENT EVALUATION  Name: Debra Little  MRN: [id number]  Date:   03/03/2020     DOB: 1956/10/05   This 64 y.o. 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  REFERRING PHYSICIAN: Tonia Ghent, MD  CHIEF COMPLAINT:  Chief Complaint  Patient presents with  . Breast Cancer    Initial consultation    DIAGNOSIS: The encounter diagnosis was Malignant neoplasm of lower-outer quadrant of right breast of female, estrogen receptor negative ([place]).   PREVIOUS INVESTIGATIONS:  Mammograms ultrasounds and MRI scans as well as PET scan all reviewed Pathology report reviewed Clinical notes reviewed  HPI: Patient is a 64 year old female who was presented in the past with some abnormal breast examinations with biopsies showing no evidence of malignancy.  She recently presented with red discoloration around her right nipple areolar complex and a palpable mass which led to a mammogram in August 2020.  At that time she was noted to have a 2.7 x 2 x 2.3 cm mass in the right breast with 4 more morphologically abnormal lymph nodes.  Biopsy confirmed grade 3 triple negative invasive mammary carcinoma.  There was focal squamous differentiation concerning for metaplastic differentiation.  Lymphovascular invasion was present.  Biopsy of her nodes showed extranodal extension.  Initial MRI scan again confirmed a 3.5 x 2.7 x 3.4 cm right breast mass with satellite nodules consistent with satellite lesions.  There is at least 3 abnormal lymph nodes in her right axilla.  Of note she did have DCIS in her left breast.  PET scan showed hypermetabolic satellite nodules in the right breast.  There is also right breast primary noted with right axillary nodal metastasis.  She underwent 3 cycles of AC neoadjuvant [place] along  with Keytruda.  There was no significant response in tumor and patient had a bilateral mastectomy with final pathology showing 3.5 cm metaplastic triple negative carcinoma 7 out of 9 lymph nodes were positive for metastatic cancer with extranodal extension.  This was a pathologic T2 N2A lesion.  She complete another cycle of AC followed by weekly Taxol which she is just completed.  She does have mild peripheral neuropathy.  She is seen today for radiation oncology opinion she is doing well she specifically denies any chest wall tenderness cough or bone pain.  She is not sure of breast reconstruction at this time.  PLANNED TREATMENT REGIMEN: Right chest wall and peripheral emphatic radiation  PAST MEDICAL HISTORY:  has a past medical history of Anemia, Anxiety, Breast cancer ([place]) (2020), Cyst of breast, Dysrhythmia, Family history of breast cancer, Family history of colon cancer, Family history of leukemia, Family history of ovarian cancer, Fibromyalgia, GERD (gastroesophageal reflux disease), Heart murmur, Hemorrhoids, Herpes, genital (04/2015), Hiatal hernia, Hypercholesterolemia, Hyperlipidemia, IBS (irritable bowel syndrome), IC (interstitial cystitis), Mild depression ([place]), MVP (mitral valve prolapse), Osteopenia, PONV (postoperative nausea and vomiting), and Vitamin D deficiency.    PAST SURGICAL HISTORY:  Past Surgical History:  Procedure Laterality Date  . ABDOMINAL HYSTERECTOMY    . APPENDECTOMY    . BLADDER SURGERY     1980's  . BREAST EXCISIONAL BIOPSY Right    benign  . BREAST EXCISIONAL BIOPSY Bilateral    benign  . COLONOSCOPY  09/2014   Dr. [name]  . COLONOSCOPY  2009   at [facility] 1 POLYP (BENIGN)  . excision  of breast cysts     hx of multiple cyst aspirations  . EXPLORATORY LAPAROTOMY    . MASTECTOMY MODIFIED RADICAL Bilateral 10/09/2019   Procedure: RIGHT MASTECTOMY MODIFIED RADICAL AND LEFT TOTAL MASTECTOMY;  Surgeon: Rolm Bookbinder, MD;  Location: [place];  Service:  General;  Laterality: Bilateral;  . PORTA CATH INSERTION N/A 08/06/2019   Procedure: PORTA CATH INSERTION;  Surgeon: Katha Cabal, MD;  Location: [place] [facility];  Service: Cardiovascular;  Laterality: N/A;  . PORTACATH PLACEMENT Left 08/05/2019   Procedure: INSERTION PORT-A-CATH, Attempted;  Surgeon: Jules Husbands, MD;  Location: [facility];  Service: General;  Laterality: Left;  . TUBAL LIGATION      FAMILY HISTORY: family history includes Breast cancer in her cousin; Breast cancer (age of onset: 4) in her mother; Cancer in her maternal uncle; Colon cancer (age of onset: 28) in her father; Gallbladder disease in her paternal grandmother; Hyperlipidemia in her father; Hypertension in her mother; Liver cancer (age of onset: 34) in her cousin; Ovarian cancer (age of onset: 58) in her maternal aunt.  SOCIAL HISTORY:  reports that she has never smoked. She has never used smokeless tobacco. She reports that she does not drink alcohol or use drugs.  ALLERGIES: Amitiza [lubiprostone], Bentyl [dicyclomine hcl], Celecoxib, Cortisone, Mobic [meloxicam], Nitrofurantoin monohyd macro, Sulfonamide derivatives, Zoloft [sertraline hcl], Buspar [buspirone], Doxycycline, and Penicillins  MEDICATIONS:  Current Outpatient Medications  Medication Sig Dispense Refill  . acetaminophen (TYLENOL) 650 MG CR tablet Take 650 mg by mouth every 8 (eight) hours as needed for pain.    . Ascorbic Acid (VITAMIN C) 100 MG tablet Take 100 mg by mouth daily.    [name] atorvastatin (LIPITOR) 40 MG tablet Take 40 mg by mouth at bedtime.   1  . cholecalciferol (VITAMIN D3) 25 MCG (1000 UT) tablet Take 1,000 Units by mouth daily.     . clonazePAM (KLONOPIN) 0.5 MG tablet Take 0.5 mg by mouth 2 (two) times daily.     . DULoxetine (CYMBALTA) 60 MG capsule Take 60 mg by mouth daily.     [name] gabapentin (NEURONTIN) 100 MG capsule Take 1-2 capsules (100-200 mg total) by mouth 3 (three) times daily as needed. 90 capsule 1  . gentamicin  ointment (GARAMYCIN) 0.1 % Apply 1 application topically in the morning, at noon, and at bedtime.    [name] letrozole (FEMARA) 2.5 MG tablet Take 1 tablet (2.5 mg total) by mouth daily. 30 tablet 3  . lidocaine-prilocaine (EMLA) cream APPLY TO AFFECTED AREAS ONCE AS DIRECTED 30 g 3  . loratadine (CLARITIN) 10 MG tablet Take 1 tablet (10 mg total) by mouth daily.    . pantoprazole (PROTONIX) 40 MG tablet TAKE 1 TABLET BY MOUTH ONCE A DAY 30 tablet 1  . Plecanatide (TRULANCE) 3 MG TABS Take 3 mg by mouth daily.     . sennosides-docusate sodium (SENOKOT-S) 8.6-50 MG tablet Take 1-2 tablets by mouth at bedtime.     . sucralfate (CARAFATE) 1 g tablet Take 1 g by mouth 3 (three) times daily.    . valACYclovir (VALTREX) 500 MG tablet Take 500 mg by mouth daily.    . vitamin B-12 (CYANOCOBALAMIN) 500 MCG tablet Take 500 mcg by mouth daily.    [name] dexamethasone (DECADRON) 4 MG tablet Take 2 tablets by mouth daily starting the day after Carboplatin and Cytoxan x 3 days. Take with food. (Patient not taking: Reported on 02/16/2020) 30 tablet 1  . HYDROcodone-acetaminophen (NORCO/VICODIN) 5-325 MG tablet  Take 1 tablet by mouth every 6 (six) hours as needed for moderate pain. (Patient not taking: Reported on 03/01/2020) 15 tablet 0  . magic mouthwash w/lidocaine SOLN Take 5 mLs by mouth 4 (four) times daily. 80 ml viscous lidocaine 2%, 80 ml Mylanta, 80 ml Diphenhydramine 12.5 mg/5 ml Elixir, 80 ml Nystatin 100,000 Unit suspension, 80 ml Prednisolone 15 mg/65ml, 80 ml Distilled Water. Sig: Swish/Swallow 5-10 ml four times a day as needed. Dispense 480 ml. 3RFs (Patient not taking: Reported on 03/01/2020) 480 mL 3  . methocarbamol (ROBAXIN) 500 MG tablet Take 1 tablet (500 mg total) by mouth every 6 (six) hours as needed for muscle spasms. (Patient not taking: Reported on 03/01/2020) 30 tablet 1  . ondansetron (ZOFRAN) 8 MG tablet Take 1 tablet (8 mg total) by mouth 2 (two) times daily as needed. Start on the third day after  chemotherapy. (Patient not taking: Reported on 02/16/2020) 30 tablet 1  . prochlorperazine (COMPAZINE) 10 MG tablet Take 1 tablet (10 mg total) by mouth every 6 (six) hours as needed (Nausea or vomiting). (Patient not taking: Reported on 03/01/2020) 30 tablet 1   No current facility-administered medications for this encounter.    ECOG PERFORMANCE STATUS:  0 - Asymptomatic  REVIEW OF SYSTEMS: Patient denies any weight loss, fatigue, weakness, fever, chills or night sweats. Patient denies any loss of vision, blurred vision. Patient denies any ringing  of the ears or hearing loss. No irregular heartbeat. Patient denies heart murmur or history of fainting. Patient denies any chest pain or pain radiating to her upper extremities. Patient denies any shortness of breath, difficulty breathing at night, cough or hemoptysis. Patient denies any swelling in the lower legs. Patient denies any nausea vomiting, vomiting of blood, or coffee ground material in the vomitus. Patient denies any stomach pain. Patient states has had normal bowel movements no significant constipation or diarrhea. Patient denies any dysuria, hematuria or significant nocturia. Patient denies any problems walking, swelling in the joints or loss of balance. Patient denies any skin changes, loss of hair or loss of weight. Patient denies any excessive worrying or anxiety or significant depression. Patient denies any problems with insomnia. Patient denies excessive thirst, polyuria, polydipsia. Patient denies any swollen glands, patient denies easy bruising or easy bleeding. Patient denies any recent infections, allergies or URI. Patient "s visual fields have not changed significantly in recent time.   PHYSICAL EXAM: BP 135/67 (BP Location: Left Arm, Patient Position: Sitting)   Pulse 78   Temp (!) 97.1 F (36.2 C) (Tympanic)   Resp 18   Wt 157 lb 8 oz (71.4 kg)   BMI 24.67 kg/m  Patient is status post bilateral mastectomies.  Chest wall  incisions are well-healed.  No mass or nodularity in chest scars are appreciated.  No axillary or supraclavicular adenopathy is identified.  I see no evidence of lymphedema in her right upper extremity.  Well-developed well-nourished patient in NAD. HEENT reveals PERLA, EOMI, discs not visualized.  Oral cavity is clear. No oral mucosal lesions are identified. Neck is clear without evidence of cervical or supraclavicular adenopathy. Lungs are clear to A&P. Cardiac examination is essentially unremarkable with regular rate and rhythm without murmur rub or thrill. Abdomen is benign with no organomegaly or masses noted. Motor sensory and DTR levels are equal and symmetric in the upper and lower extremities. Cranial nerves II through XII are grossly intact. Proprioception is intact. No peripheral adenopathy or edema is identified. No motor or sensory levels  are noted. Crude visual fields are within normal range.  LABORATORY DATA: Pathology report reviewed    RADIOLOGY RESULTS: MRI scans PET scans ultrasound and mammograms reviewed compatible with above-stated findings   IMPRESSION: Stage IIIb triple negative metaplastic invasive mammary carcinoma the right breast status post neoadjuvant chemotherapy followed by bilateral mastectomies and adjuvant Taxol chemotherapy in 64 year old female  PLAN: At this time like to go ahead with adjuvant chest wall peripheral emphatic radiation.  Would plan on delivering 5040 cGy in 28 fractions to both areas.  Would also boost her scar another 1400 centigrade using electron beam.  Risks and benefits of treatment including skin reaction fatigue alteration of blood counts possible inclusion of superficial lung and slight chance of right upper extremity lymphedema all were discussed with the patient.  I have assured her down the road she can always have breast reconstruction with expanders placed if that is her desire.  She is undecided at this time about going ahead with breast  reconstruction.  I have personally set up and ordered CT simulation for next week to allow some further recovery from her chemotherapy.  Patient comprehends my treatment plan well.  I would like to take this opportunity to thank you for allowing me to participate in the care of your patient.Lorayne Marek, MD

## 2020-03-04 DIAGNOSIS — D0512 Intraductal carcinoma in situ of left breast: Secondary | ICD-10-CM | POA: Insufficient documentation

## 2020-03-04 NOTE — Progress Notes (Signed)
   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 Byrnett in the past and has undergone breast biopsies which did not previously showed malignancy. More recently patient noted some red discoloration around her right perioral as well as a palpable mass which led to a diagnostic mammogram on the right side her prior mammogram in February 2020 was normal in August 2020 she was noted to have a 2.7 x 2 x 2.3 cm mass in her right breast along with 4 morphologically abnormal lymph nodes. She underwent breast biopsy as well as lymph node biopsy which showed grade 3 invasive mammary carcinoma. ER PR and HER-2/neunegative.Sections show high-grade invasive carcinoma with focal squamous differentiation and pinpoint keratin ideation. The features are concerning for metaplastic differentiation.. Lymphovascular invasion present. There was extranodal extension present on the lymph node biopsy.   Her family history is significant for breast cancer in her mother in her 60s. Father had colon cancer in his70s.Family history also significant for ovarian cancer in her maternal aunt.  MRI of bilateral breasts showed: Primary  right breast mass was 3.5 x 2.7 x 3.4 cm. There were surrounding numerous nodules consistent with satellite lesions. Extensive nodular non-mass enhancement throughout the right breast involving the upper and lower quadrants spanning 6.2 x 4.5 x 5.5 cm. Markedly enhancement of the left breast diffusely. Non-mass enhancement measures 1.9 x 2.4 cm. At least 3 abnormal lymph nodes in the right axilla. No abnormal left axillary lymph nodes. 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 Equivocal inferior breast and left axillary nodal hypermetabolism. No evidence of extrathoracic hypermetabolic metastases. Lateral right breast primary with right axillary nodal metastases  Patient had 3 cycles of neoadjuvant AC chemotherapy along with Keytruda. There was no significant response in her tumor and patient proceeded with bilateral mastectomy with targeted node dissection. 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 7 lymph nodes were positive for malignancy with extranodal extension overall cancer cellularity was 40% [id number].Patient completed 1 cycle of adjuvant AC chemotherapy and will complete weekly Taxol chemotherapy in April 2021  Interval history-patient reports ongoing fatigue.  She has baseline tingling numbness in her hands and feet which is remained stable.  Also has chronic pain in her left knee for which she will be seeing orthopedics soon.  Symptoms of abdominal bloating are better  ECOG PS- 1 Pain scale- 0   Review of systems- Review of Systems  Constitutional: Positive for malaise/fatigue. Negative for chills, fever and weight loss.  HENT: Negative for congestion, ear discharge and nosebleeds.   Eyes: Negative for blurred vision.  Respiratory: Negative for cough, hemoptysis, sputum production, shortness of breath and wheezing.     Cardiovascular: Negative for chest      Hematology/Oncology Consult note [facility]  [phone] [phone]  Patient Care Team: Duncan, Graham S, MD as PCP - General (Family Medicine) Byrnett, Jeffrey W, MD (General Surgery) Schaller, Robert N, MD (Family Medicine) McGowan, Shannon A, PA-C as Physician Assistant (Urology) Fath, Kenneth A, MD as Consulting Physician (Cardiology) ,  C, MD as Consulting Physician (Hematology and Oncology)   Name of the patient: Debra Little  [id number]  07/12/1956   Date of visit: 03/04/20  Diagnosis- clinical prognostic stage IIIb triple negative breast cancer with metaplastic features cT2 cN1 M0   Chief complaint/ Reason for visit-on treatment assessment prior to cycle 12 of weekly Taxol chemotherapy  Heme/Onc history: Patient is a 63-year-old female with seen Dr. Byrnett in the past and has undergone breast biopsies which did not previously showed malignancy. More recently patient noted some red discoloration around her right perioral as well as a palpable mass which led to a diagnostic mammogram on the right side her prior mammogram in February 2020 was normal in August 2020 she was noted to have a 2.7 x 2 x 2.3 cm mass in her right breast along with 4 morphologically abnormal lymph nodes. She underwent breast biopsy as well as lymph node biopsy which showed grade 3 invasive mammary carcinoma. ER PR and HER-2/neunegative.Sections show high-grade invasive carcinoma with focal squamous differentiation and pinpoint keratin ideation. The features are concerning for metaplastic differentiation.. Lymphovascular invasion present. There was extranodal extension present on the lymph node biopsy.   Her family history is significant for breast cancer in her mother in her 60s. Father had colon cancer in his70s.Family history also significant for ovarian cancer in her maternal aunt.  MRI of bilateral breasts showed: Primary  right breast mass was 3.5 x 2.7 x 3.4 cm. There were surrounding numerous nodules consistent with satellite lesions. Extensive nodular non-mass enhancement throughout the right breast involving the upper and lower quadrants spanning 6.2 x 4.5 x 5.5 cm. Markedly enhancement of the left breast diffusely. Non-mass enhancement measures 1.9 x 2.4 cm. At least 3 abnormal lymph nodes in the right axilla. No abnormal left axillary lymph nodes. 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 Equivocal inferior breast and left axillary nodal hypermetabolism. No evidence of extrathoracic hypermetabolic metastases. Lateral right breast primary with right axillary nodal metastases  Patient had 3 cycles of neoadjuvant AC chemotherapy along with Keytruda. There was no significant response in her tumor and patient proceeded with bilateral mastectomy with targeted node dissection. 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 7 lymph nodes were positive for malignancy with extranodal extension overall cancer cellularity was 40% [id number].Patient completed 1 cycle of adjuvant AC chemotherapy and will complete weekly Taxol chemotherapy in April 2021  Interval history-patient reports ongoing fatigue.  She has baseline tingling numbness in her hands and feet which is remained stable.  Also has chronic pain in her left knee for which she will be seeing orthopedics soon.  Symptoms of abdominal bloating are better  ECOG PS- 1 Pain scale- 0   Review of systems- Review of Systems  Constitutional: Positive for malaise/fatigue. Negative for chills, fever and weight loss.  HENT: Negative for congestion, ear discharge and nosebleeds.   Eyes: Negative for blurred vision.  Respiratory: Negative for cough, hemoptysis, sputum production, shortness of breath and wheezing.     Cardiovascular: Negative for chest      Hematology/Oncology Consult note [facility]  [phone] [phone]  Patient Care Team: Duncan, Graham S, MD as PCP - General (Family Medicine) Byrnett, Jeffrey W, MD (General Surgery) Schaller, Robert N, MD (Family Medicine) McGowan, Shannon A, PA-C as Physician Assistant (Urology) Fath, Kenneth A, MD as Consulting Physician (Cardiology) ,  C, MD as Consulting Physician (Hematology and Oncology)   Name of the patient: Debra Little  [id number]  07/12/1956   Date of visit: 03/04/20  Diagnosis- clinical prognostic stage IIIb triple negative breast cancer with metaplastic features cT2 cN1 M0   Chief complaint/ Reason for visit-on treatment assessment prior to cycle 12 of weekly Taxol chemotherapy  Heme/Onc history: Patient is a 63-year-old female with seen Dr. Byrnett in the past and has undergone breast biopsies which did not previously showed malignancy. More recently patient noted some red discoloration around her right perioral as well as a palpable mass which led to a diagnostic mammogram on the right side her prior mammogram in February 2020 was normal in August 2020 she was noted to have a 2.7 x 2 x 2.3 cm mass in her right breast along with 4 morphologically abnormal lymph nodes. She underwent breast biopsy as well as lymph node biopsy which showed grade 3 invasive mammary carcinoma. ER PR and HER-2/neunegative.Sections show high-grade invasive carcinoma with focal squamous differentiation and pinpoint keratin ideation. The features are concerning for metaplastic differentiation.. Lymphovascular invasion present. There was extranodal extension present on the lymph node biopsy.   Her family history is significant for breast cancer in her mother in her 60s. Father had colon cancer in his70s.Family history also significant for ovarian cancer in her maternal aunt.  MRI of bilateral breasts showed: Primary  right breast mass was 3.5 x 2.7 x 3.4 cm. There were surrounding numerous nodules consistent with satellite lesions. Extensive nodular non-mass enhancement throughout the right breast involving the upper and lower quadrants spanning 6.2 x 4.5 x 5.5 cm. Markedly enhancement of the left breast diffusely. Non-mass enhancement measures 1.9 x 2.4 cm. At least 3 abnormal lymph nodes in the right axilla. No abnormal left axillary lymph nodes. 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 Equivocal inferior breast and left axillary nodal hypermetabolism. No evidence of extrathoracic hypermetabolic metastases. Lateral right breast primary with right axillary nodal metastases  Patient had 3 cycles of neoadjuvant AC chemotherapy along with Keytruda. There was no significant response in her tumor and patient proceeded with bilateral mastectomy with targeted node dissection. 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 7 lymph nodes were positive for malignancy with extranodal extension overall cancer cellularity was 40% [id number].Patient completed 1 cycle of adjuvant AC chemotherapy and will complete weekly Taxol chemotherapy in April 2021  Interval history-patient reports ongoing fatigue.  She has baseline tingling numbness in her hands and feet which is remained stable.  Also has chronic pain in her left knee for which she will be seeing orthopedics soon.  Symptoms of abdominal bloating are better  ECOG PS- 1 Pain scale- 0   Review of systems- Review of Systems  Constitutional: Positive for malaise/fatigue. Negative for chills, fever and weight loss.  HENT: Negative for congestion, ear discharge and nosebleeds.   Eyes: Negative for blurred vision.  Respiratory: Negative for cough, hemoptysis, sputum production, shortness of breath and wheezing.     Cardiovascular: Negative for chest      Hematology/Oncology Consult note [facility]  [phone] [phone]  Patient Care Team: Duncan, Graham S, MD as PCP - General (Family Medicine) Byrnett, Jeffrey W, MD (General Surgery) Schaller, Robert N, MD (Family Medicine) McGowan, Shannon A, PA-C as Physician Assistant (Urology) Fath, Kenneth A, MD as Consulting Physician (Cardiology) ,  C, MD as Consulting Physician (Hematology and Oncology)   Name of the patient: Debra Little  [id number]  07/12/1956   Date of visit: 03/04/20  Diagnosis- clinical prognostic stage IIIb triple negative breast cancer with metaplastic features cT2 cN1 M0   Chief complaint/ Reason for visit-on treatment assessment prior to cycle 12 of weekly Taxol chemotherapy  Heme/Onc history: Patient is a 63-year-old female with seen Dr. Byrnett in the past and has undergone breast biopsies which did not previously showed malignancy. More recently patient noted some red discoloration around her right perioral as well as a palpable mass which led to a diagnostic mammogram on the right side her prior mammogram in February 2020 was normal in August 2020 she was noted to have a 2.7 x 2 x 2.3 cm mass in her right breast along with 4 morphologically abnormal lymph nodes. She underwent breast biopsy as well as lymph node biopsy which showed grade 3 invasive mammary carcinoma. ER PR and HER-2/neunegative.Sections show high-grade invasive carcinoma with focal squamous differentiation and pinpoint keratin ideation. The features are concerning for metaplastic differentiation.. Lymphovascular invasion present. There was extranodal extension present on the lymph node biopsy.   Her family history is significant for breast cancer in her mother in her 60s. Father had colon cancer in his70s.Family history also significant for ovarian cancer in her maternal aunt.  MRI of bilateral breasts showed: Primary  right breast mass was 3.5 x 2.7 x 3.4 cm. There were surrounding numerous nodules consistent with satellite lesions. Extensive nodular non-mass enhancement throughout the right breast involving the upper and lower quadrants spanning 6.2 x 4.5 x 5.5 cm. Markedly enhancement of the left breast diffusely. Non-mass enhancement measures 1.9 x 2.4 cm. At least 3 abnormal lymph nodes in the right axilla. No abnormal left axillary lymph nodes. 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 Equivocal inferior breast and left axillary nodal hypermetabolism. No evidence of extrathoracic hypermetabolic metastases. Lateral right breast primary with right axillary nodal metastases  Patient had 3 cycles of neoadjuvant AC chemotherapy along with Keytruda. There was no significant response in her tumor and patient proceeded with bilateral mastectomy with targeted node dissection. 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 7 lymph nodes were positive for malignancy with extranodal extension overall cancer cellularity was 40% [id number].Patient completed 1 cycle of adjuvant AC chemotherapy and will complete weekly Taxol chemotherapy in April 2021  Interval history-patient reports ongoing fatigue.  She has baseline tingling numbness in her hands and feet which is remained stable.  Also has chronic pain in her left knee for which she will be seeing orthopedics soon.  Symptoms of abdominal bloating are better  ECOG PS- 1 Pain scale- 0   Review of systems- Review of Systems  Constitutional: Positive for malaise/fatigue. Negative for chills, fever and weight loss.  HENT: Negative for congestion, ear discharge and nosebleeds.   Eyes: Negative for blurred vision.  Respiratory: Negative for cough, hemoptysis, sputum production, shortness of breath and wheezing.     Cardiovascular: Negative for chest      Hematology/Oncology Consult note [facility]  [phone] [phone]  Patient Care Team: Duncan, Graham S, MD as PCP - General (Family Medicine) Byrnett, Jeffrey W, MD (General Surgery) Schaller, Robert N, MD (Family Medicine) McGowan, Shannon A, PA-C as Physician Assistant (Urology) Fath, Kenneth A, MD as Consulting Physician (Cardiology) ,  C, MD as Consulting Physician (Hematology and Oncology)   Name of the patient: Debra Little  [id number]  07/12/1956   Date of visit: 03/04/20  Diagnosis- clinical prognostic stage IIIb triple negative breast cancer with metaplastic features cT2 cN1 M0   Chief complaint/ Reason for visit-on treatment assessment prior to cycle 12 of weekly Taxol chemotherapy  Heme/Onc history: Patient is a 63-year-old female with seen Dr. Byrnett in the past and has undergone breast biopsies which did not previously showed malignancy. More recently patient noted some red discoloration around her right perioral as well as a palpable mass which led to a diagnostic mammogram on the right side her prior mammogram in February 2020 was normal in August 2020 she was noted to have a 2.7 x 2 x 2.3 cm mass in her right breast along with 4 morphologically abnormal lymph nodes. She underwent breast biopsy as well as lymph node biopsy which showed grade 3 invasive mammary carcinoma. ER PR and HER-2/neunegative.Sections show high-grade invasive carcinoma with focal squamous differentiation and pinpoint keratin ideation. The features are concerning for metaplastic differentiation.. Lymphovascular invasion present. There was extranodal extension present on the lymph node biopsy.   Her family history is significant for breast cancer in her mother in her 60s. Father had colon cancer in his70s.Family history also significant for ovarian cancer in her maternal aunt.  MRI of bilateral breasts showed: Primary  right breast mass was 3.5 x 2.7 x 3.4 cm. There were surrounding numerous nodules consistent with satellite lesions. Extensive nodular non-mass enhancement throughout the right breast involving the upper and lower quadrants spanning 6.2 x 4.5 x 5.5 cm. Markedly enhancement of the left breast diffusely. Non-mass enhancement measures 1.9 x 2.4 cm. At least 3 abnormal lymph nodes in the right axilla. No abnormal left axillary lymph nodes. 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 Equivocal inferior breast and left axillary nodal hypermetabolism. No evidence of extrathoracic hypermetabolic metastases. Lateral right breast primary with right axillary nodal metastases  Patient had 3 cycles of neoadjuvant AC chemotherapy along with Keytruda. There was no significant response in her tumor and patient proceeded with bilateral mastectomy with targeted node dissection. 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 7 lymph nodes were positive for malignancy with extranodal extension overall cancer cellularity was 40% [id number].Patient completed 1 cycle of adjuvant AC chemotherapy and will complete weekly Taxol chemotherapy in April 2021  Interval history-patient reports ongoing fatigue.  She has baseline tingling numbness in her hands and feet which is remained stable.  Also has chronic pain in her left knee for which she will be seeing orthopedics soon.  Symptoms of abdominal bloating are better  ECOG PS- 1 Pain scale- 0   Review of systems- Review of Systems  Constitutional: Positive for malaise/fatigue. Negative for chills, fever and weight loss.  HENT: Negative for congestion, ear discharge and nosebleeds.   Eyes: Negative for blurred vision.  Respiratory: Negative for cough, hemoptysis, sputum production, shortness of breath and wheezing.     Cardiovascular: Negative for chest      Hematology/Oncology Consult note [facility]  [phone] [phone]  Patient Care Team: Duncan, Graham S, MD as PCP - General (Family Medicine) Byrnett, Jeffrey W, MD (General Surgery) Schaller, Robert N, MD (Family Medicine) McGowan, Shannon A, PA-C as Physician Assistant (Urology) Fath, Kenneth A, MD as Consulting Physician (Cardiology) ,  C, MD as Consulting Physician (Hematology and Oncology)   Name of the patient: Debra Little  [id number]  07/12/1956   Date of visit: 03/04/20  Diagnosis- clinical prognostic stage IIIb triple negative breast cancer with metaplastic features cT2 cN1 M0   Chief complaint/ Reason for visit-on treatment assessment prior to cycle 12 of weekly Taxol chemotherapy  Heme/Onc history: Patient is a 63-year-old female with seen Dr. Byrnett in the past and has undergone breast biopsies which did not previously showed malignancy. More recently patient noted some red discoloration around her right perioral as well as a palpable mass which led to a diagnostic mammogram on the right side her prior mammogram in February 2020 was normal in August 2020 she was noted to have a 2.7 x 2 x 2.3 cm mass in her right breast along with 4 morphologically abnormal lymph nodes. She underwent breast biopsy as well as lymph node biopsy which showed grade 3 invasive mammary carcinoma. ER PR and HER-2/neunegative.Sections show high-grade invasive carcinoma with focal squamous differentiation and pinpoint keratin ideation. The features are concerning for metaplastic differentiation.. Lymphovascular invasion present. There was extranodal extension present on the lymph node biopsy.   Her family history is significant for breast cancer in her mother in her 60s. Father had colon cancer in his70s.Family history also significant for ovarian cancer in her maternal aunt.  MRI of bilateral breasts showed: Primary  right breast mass was 3.5 x 2.7 x 3.4 cm. There were surrounding numerous nodules consistent with satellite lesions. Extensive nodular non-mass enhancement throughout the right breast involving the upper and lower quadrants spanning 6.2 x 4.5 x 5.5 cm. Markedly enhancement of the left breast diffusely. Non-mass enhancement measures 1.9 x 2.4 cm. At least 3 abnormal lymph nodes in the right axilla. No abnormal left axillary lymph nodes. 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 Equivocal inferior breast and left axillary nodal hypermetabolism. No evidence of extrathoracic hypermetabolic metastases. Lateral right breast primary with right axillary nodal metastases  Patient had 3 cycles of neoadjuvant AC chemotherapy along with Keytruda. There was no significant response in her tumor and patient proceeded with bilateral mastectomy with targeted node dissection. 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 7 lymph nodes were positive for malignancy with extranodal extension overall cancer cellularity was 40% [id number].Patient completed 1 cycle of adjuvant AC chemotherapy and will complete weekly Taxol chemotherapy in April 2021  Interval history-patient reports ongoing fatigue.  She has baseline tingling numbness in her hands and feet which is remained stable.  Also has chronic pain in her left knee for which she will be seeing orthopedics soon.  Symptoms of abdominal bloating are better  ECOG PS- 1 Pain scale- 0   Review of systems- Review of Systems  Constitutional: Positive for malaise/fatigue. Negative for chills, fever and weight loss.  HENT: Negative for congestion, ear discharge and nosebleeds.   Eyes: Negative for blurred vision.  Respiratory: Negative for cough, hemoptysis, sputum production, shortness of breath and wheezing.     Cardiovascular: Negative for chest      Hematology/Oncology Consult note [facility]  [phone] [phone]  Patient Care Team: Duncan, Graham S, MD as PCP - General (Family Medicine) Byrnett, Jeffrey W, MD (General Surgery) Schaller, Robert N, MD (Family Medicine) McGowan, Shannon A, PA-C as Physician Assistant (Urology) Fath, Kenneth A, MD as Consulting Physician (Cardiology) ,  C, MD as Consulting Physician (Hematology and Oncology)   Name of the patient: Debra Little  [id number]  07/12/1956   Date of visit: 03/04/20  Diagnosis- clinical prognostic stage IIIb triple negative breast cancer with metaplastic features cT2 cN1 M0   Chief complaint/ Reason for visit-on treatment assessment prior to cycle 12 of weekly Taxol chemotherapy  Heme/Onc history: Patient is a 63-year-old female with seen Dr. Byrnett in the past and has undergone breast biopsies which did not previously showed malignancy. More recently patient noted some red discoloration around her right perioral as well as a palpable mass which led to a diagnostic mammogram on the right side her prior mammogram in February 2020 was normal in August 2020 she was noted to have a 2.7 x 2 x 2.3 cm mass in her right breast along with 4 morphologically abnormal lymph nodes. She underwent breast biopsy as well as lymph node biopsy which showed grade 3 invasive mammary carcinoma. ER PR and HER-2/neunegative.Sections show high-grade invasive carcinoma with focal squamous differentiation and pinpoint keratin ideation. The features are concerning for metaplastic differentiation.. Lymphovascular invasion present. There was extranodal extension present on the lymph node biopsy.   Her family history is significant for breast cancer in her mother in her 60s. Father had colon cancer in his70s.Family history also significant for ovarian cancer in her maternal aunt.  MRI of bilateral breasts showed: Primary  right breast mass was 3.5 x 2.7 x 3.4 cm. There were surrounding numerous nodules consistent with satellite lesions. Extensive nodular non-mass enhancement throughout the right breast involving the upper and lower quadrants spanning 6.2 x 4.5 x 5.5 cm. Markedly enhancement of the left breast diffusely. Non-mass enhancement measures 1.9 x 2.4 cm. At least 3 abnormal lymph nodes in the right axilla. No abnormal left axillary lymph nodes. 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 Equivocal inferior breast and left axillary nodal hypermetabolism. No evidence of extrathoracic hypermetabolic metastases. Lateral right breast primary with right axillary nodal metastases  Patient had 3 cycles of neoadjuvant AC chemotherapy along with Keytruda. There was no significant response in her tumor and patient proceeded with bilateral mastectomy with targeted node dissection. 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 7 lymph nodes were positive for malignancy with extranodal extension overall cancer cellularity was 40% [id number].Patient completed 1 cycle of adjuvant AC chemotherapy and will complete weekly Taxol chemotherapy in April 2021  Interval history-patient reports ongoing fatigue.  She has baseline tingling numbness in her hands and feet which is remained stable.  Also has chronic pain in her left knee for which she will be seeing orthopedics soon.  Symptoms of abdominal bloating are better  ECOG PS- 1 Pain scale- 0   Review of systems- Review of Systems  Constitutional: Positive for malaise/fatigue. Negative for chills, fever and weight loss.  HENT: Negative for congestion, ear discharge and nosebleeds.   Eyes: Negative for blurred vision.  Respiratory: Negative for cough, hemoptysis, sputum production, shortness of breath and wheezing.     Cardiovascular: Negative for chest

## 2020-03-05 ENCOUNTER — Telehealth: Payer: Self-pay | Admitting: *Deleted

## 2020-03-05 DIAGNOSIS — R350 Frequency of micturition: Secondary | ICD-10-CM | POA: Diagnosis not present

## 2020-03-05 NOTE — Telephone Encounter (Signed)
Patient called to see if she had any questions after her consultation with Dr. Baruch Gouty.  Patient verbalized she did not have any questions and denied need for further education at this time.

## 2020-03-08 ENCOUNTER — Other Ambulatory Visit: Payer: Self-pay

## 2020-03-10 ENCOUNTER — Other Ambulatory Visit: Payer: Self-pay

## 2020-03-10 ENCOUNTER — Inpatient Hospital Stay: Payer: PPO | Admitting: Occupational Therapy

## 2020-03-10 ENCOUNTER — Ambulatory Visit
Admission: RE | Admit: 2020-03-10 | Discharge: 2020-03-10 | Disposition: A | Discharge status: Home / Self Care | Payer: PPO | Source: Ambulatory Visit | Attending: Radiation Oncology | Admitting: Radiation Oncology

## 2020-03-10 ENCOUNTER — Other Ambulatory Visit: Payer: Self-pay | Admitting: *Deleted

## 2020-03-10 DIAGNOSIS — I890 Lymphedema, not elsewhere classified: Secondary | ICD-10-CM

## 2020-03-10 DIAGNOSIS — L905 Scar conditions and fibrosis of skin: Secondary | ICD-10-CM

## 2020-03-10 DIAGNOSIS — C50311 Malignant neoplasm of lower-inner quadrant of right female breast: Secondary | ICD-10-CM | POA: Diagnosis not present

## 2020-03-10 DIAGNOSIS — Z510 Encounter for antineoplastic radiation therapy: Secondary | ICD-10-CM | POA: Insufficient documentation

## 2020-03-10 DIAGNOSIS — C50511 Malignant neoplasm of lower-outer quadrant of right female breast: Secondary | ICD-10-CM | POA: Diagnosis not present

## 2020-03-10 NOTE — Therapy (Signed)
[facility] [address] [place], [place], [place], [address] Phone: [phone]   Fax:  [phone]  Occupational Therapy Evaluation  Patient Details  Name: Debra Little MRN: [id number] Date of Birth: Sep 19, 1956 No data recorded  Encounter Date: 03/10/2020  OT End of Session - 03/10/20 1133    Visit Number  0       Past Medical History:  Diagnosis Date  . Anemia    d/t chemo - last Hgb 10.8  . Anxiety    sees Dr. [name]  . Breast cancer ([place]) 2020  . Cyst of breast    per [name]  . Dysrhythmia    Hx of palpitations  . Family history of breast cancer   . Family history of colon cancer   . Family history of leukemia   . Family history of ovarian cancer   . Fibromyalgia   . GERD (gastroesophageal reflux disease)   . Heart murmur   . Hemorrhoids   . Herpes, genital 04/2015   confirmed with HSV 2 IgG  . Hiatal hernia   . Hypercholesterolemia    Dr. Kyra Searles  . Hyperlipidemia   . IBS (irritable bowel syndrome)    constipation predominant  . IC (interstitial cystitis)    per [place] [facility]  . Mild depression ([place])   . MVP (mitral valve prolapse)    [facility] cards eval 2012  . Osteopenia    2010/2017, DEXA at [facility];spine and fem neck  . PONV (postoperative nausea and vomiting)   . Vitamin D deficiency    history of    Past Surgical History:  Procedure Laterality Date  . ABDOMINAL HYSTERECTOMY    . APPENDECTOMY    . BLADDER SURGERY     1980's  . BREAST EXCISIONAL BIOPSY Right    benign  . BREAST EXCISIONAL BIOPSY Bilateral    benign  . COLONOSCOPY  09/2014   Dr. [name]  . COLONOSCOPY  2009   at [facility] 1 POLYP (BENIGN)  . excision of breast cysts     hx of multiple cyst aspirations  . EXPLORATORY LAPAROTOMY    . MASTECTOMY MODIFIED RADICAL Bilateral 10/09/2019   Procedure: RIGHT MASTECTOMY MODIFIED RADICAL AND LEFT TOTAL MASTECTOMY;  Surgeon: Rolm Bookbinder, MD;  Location: [place];   Service: General;  Laterality: Bilateral;  . PORTA CATH INSERTION N/A 08/06/2019   Procedure: PORTA CATH INSERTION;  Surgeon: Katha Cabal, MD;  Location: [place] [facility];  Service: Cardiovascular;  Laterality: N/A;  . PORTACATH PLACEMENT Left 08/05/2019   Procedure: INSERTION PORT-A-CATH, Attempted;  Surgeon: Jules Husbands, MD;  Location: [facility];  Service: General;  Laterality: Left;  . TUBAL LIGATION      There were no vitals filed for this visit.  Subjective Assessment - 03/10/20 1132    Subjective   I am starting radiation next week - seen Dr Donella Stade last week - did not see you since about 6 wks ago - but since then my R arm got larger - uisng my compression sleeve some - but not as I should - both my ankles are swollen - fiinshed my chemo last week          LYMPHEDEMA/ONCOLOGY QUESTIONNAIRE - 03/10/20 1105      Right Upper Extremity Lymphedema   15 cm Proximal to Olecranon Process  29.5 cm    10 cm Proximal to Olecranon Process  27.5 cm    Olecranon Process  24.5 cm  15 cm Proximal to Ulnar Styloid Process  24 cm    10 cm Proximal to Ulnar Styloid Process  21 cm    Just Proximal to Ulnar Styloid Process  15.5 cm    Across Hand at [employer]  17.5 cm    At [place] of 2nd Digit  5.6 cm    At [facility] of Thumb  6 cm      Left Upper Extremity Lymphedema   15 cm Proximal to Olecranon Process  28 cm    10 cm Proximal to Olecranon Process  25.5 cm    Olecranon Process  22.7 cm    15 cm Proximal to Ulnar Styloid Process  21 cm    10 cm Proximal to Ulnar Styloid Process  18.5 cm    Just Proximal to Ulnar Styloid Process  14 cm    Across Hand at [employer]  17 cm    At [place] of 2nd Digit  5.5 cm    At [facility] of Thumb  6 cm        NOTE FROM 11/11/2019  Pt refer by Dr Janese Banks for OT to screen for education on lymphedema and ROM Post mastectomy. Note from Dr Janese Banks 10/21/2019: Patient is a35 y.o.femalewithclinical prognostic stage IIIb cT2 cN1 M0 triple negative  invasive mammary carcinoma of the right breast.She is s/p 3 cycles of neoadjuvant AC Keytruda chemotherapy followed by bilateral mastectomy and targeted node dissection due to significant response to neoadjuvant chemotherapy.She is here to discuss further management  Discussed with the patient the results of final pathology which showed a 3.5 cm metaplastic triple negative breast cancer with 7 lymph nodes that were positive and PT2PN2A which would be a stage IIIc disease. The metaplastic pathology particularly make this aggressive. I would like to complete her adjuvant chemotherapy at this time and give her 1 cycle of AC chemotherapy followed by 12 cycles of weekly Taxol.  I will check PD-L1 status on her final pathology. If she has significant PD-L1 expression it would make sense to continue Keytruda which she has been approved for compassionate use through the drug company. However if she does not have any significant PD-L1 expression I will plan to drop her Keytruda.Treatment will be given with a curative intent  Patient is still healing from her mastectomy wound and I will give her about 3 weeks to see if she has recovered and ready to start chemo  Upon completion of chemotherapy I will refer her to radiation oncology to discuss adjuvant radiation treatment  OT note12/15/2020: Pt complain about pain this am at hernia - she took some pain meds and than helped - she will call her GI MD. Pt is about 5 wks out from bilateral mastectomy with 9 axillary ln removed on R per pt. Scar healing very well on bilateral chest - but R lateral part still draining per pt after having infection and has bandage on- she do have a velcro tube compression garment on - Done with her antibiotic for about week per pt and next appt with Dr Donne Hazel is 30th Dec  Pt show decrease Shoulder AROM over head and external rotation - pt provide with some HEP for those to do and keep pain under 2/10  Pt ed on  lymphedema and hand out provided - circumference WNL - pt is R hand dominant Will check on pt again in month   OT NOTE 12/03/2019: Pt arrive with incision healed and starting chemo yesterday back again.  Incision healed but scar adhesions worse on R chest and axilla limiting her shoulder over head and external rotation - pt to do scar mobs inarmover head position - and  Cont with AAROM on wall for shoulder flexion , external Scapula squeezes few times during day  Her L upper arm circumference to elbow increase compare to 3 wks ago and recommend for pt to get over the counter compression sleeve and glove - she moved this past weekend and was picking upr things per pt she should have not - but did not had a lot of help  Also would recommend unilateral post mastectomy jovipak breast pad to use during day - pt report some swelling under scars - but could be too because of scars adhesions Want to be preventative prior to radiation in future too   Pt to call me when she get compression garments  NOTE FROM 12/31/2019: Pt arrive with herunilateral post mastectomy jovipak breast pad on under her bra - looks good fit - review wearing it when she done a lot of heavy lifting or repetition and feels her chest or under arm is swollen - to wear it Want to be preventative prior to radiation in future too  Her L upper arm circumference to elbow increaselast timecompare tothe3 wksbefore than - she arrive with her [place] strongcompression sleeve - circumference decrease in R UE -and pt to only wear it now with high risk activities - do not have to wear glove -except if swelling in the hand   Incision healed but scarTight under arm and axilla on Rlimiting her shoulder over head and external rotationend range- pt to do scar mobs inarmover head position - and  Cont with AAROM on wall for shoulder flexion , external Scapula squeezes few times during day    Pt to follow up one more time  in month  Note for 01/28/2020)  Pt seen this date for follow up - she report wearing her sleeve longer periods if she things she going to use it a lot- remind pt to only wear with high risk activities - if going to wear longer periods she needs to wear glove too.   Her measurements did increase on R UE - but she also increased on L UE - and had weight gain of about 5 lbs.  She is still increase about 1 cm compare to L -but she is R hand dominant.  She report increase swelling all over  the few days or week after chemo but then decreases- pt to cont with jovipak breast pad as needed. And AROM in shoulder WNL and scar tissue great - and she is using her R UE normally. Pt to wear over the counter sleeve only with hight risk activities. Will reassess in 6 wks when chemo is finish and around time she is starting radiation.    Note for 03/10/2020: Pt return after not seen for 6 wks - finished chemo and getting ready to start radiation next week. Pt measurements in R UE increase this date compare to 6 wks ago by 1cm in upper arm, 2 cm at elbow and forearm and 0.5 at wrist -  Pt did not increase in L UE - no weight changes  Pt now increase compare to L UE by 2.5 in upper arm, 1.8 at elbow , 3 cm at forearm and 1.5 cm at wrist  Pt now Stage 2 lymphedema - would recommend OT order to eval and tx pt for assessment for appropriate  night time compression garment/or pump- as well as teach self MLD - and if need different daytime compression sleeve to maintain her lymphedema. Pt starting radiation next week                                 Patient will benefit from skilled therapeutic intervention in order to improve the following deficits and impairments:           Visit Diagnosis: Scar condition and fibrosis of skin    Problem List Patient Active Problem List   Diagnosis Date Noted  . Ductal carcinoma in situ (DCIS) of left breast 03/04/2020  . Bilateral breast  cancer ([place]) 10/09/2019  . Genetic testing 08/27/2019  . Family history of breast cancer   . Family history of ovarian cancer   . Family history of colon cancer   . Family history of leukemia   . Goals of care, counseling/discussion 08/07/2019  . Breast cancer ([place]) 07/30/2019  . GERD (gastroesophageal reflux disease) 06/30/2019  . Abdominal discomfort 05/15/2019  . Advance care planning 07/14/2018  . Health care maintenance 07/14/2018  . Depression, recurrent ([place]) 07/14/2018  . Constipation 07/14/2018  . Altered taste 07/14/2018  . Fibrocystic breast changes of both breasts 01/24/2018  . Vasomotor symptoms due to menopause 09/05/2017  . Herpes simplex vulvovaginitis 09/05/2017  . Skin nodule 05/25/2017  . Encounter for screening examination for infectious disease 03/18/2017  . Lipoma 03/18/2017  . Vaginitis 03/18/2017  . Radicular pain in left arm 07/27/2015  . Neuralgia and neuritis, unspecified 07/27/2015  . Fatigue 03/05/2015  . B12 deficiency 03/05/2015  . Skin rash 05/01/2014  . MVP (mitral valve prolapse) 04/30/2014  . Cyst of breast 04/05/2014  . Interstitial cystitis 03/31/2014  . Routine general medical examination at a health care facility 11/13/2011  . Anxiety and depression 10/06/2010  . BREAST CYSTS, BILATERAL 12/03/2008  . PALPITATIONS, CHRONIC 12/03/2008  . UNSPECIFIED VITAMIN D DEFICIENCY 09/10/2008  . HLD (hyperlipidemia) 09/10/2008  . IRRITABLE BOWEL SYNDROME 02/12/2008    Rosalyn Gess OTR/L,CLT 03/10/2020, 11:33 AM  [facility] [address] [place], [place], [place], [address] Phone: [phone]   Fax:  [phone]  Name: Debra Little MRN: [id number] Date of Birth: [birth year]

## 2020-03-11 DIAGNOSIS — Z510 Encounter for antineoplastic radiation therapy: Secondary | ICD-10-CM | POA: Diagnosis not present

## 2020-03-11 DIAGNOSIS — C50311 Malignant neoplasm of lower-inner quadrant of right female breast: Secondary | ICD-10-CM | POA: Diagnosis not present

## 2020-03-11 DIAGNOSIS — R350 Frequency of micturition: Secondary | ICD-10-CM | POA: Diagnosis not present

## 2020-03-12 ENCOUNTER — Other Ambulatory Visit: Payer: Self-pay | Admitting: *Deleted

## 2020-03-12 DIAGNOSIS — Z171 Estrogen receptor negative status [ER-]: Secondary | ICD-10-CM

## 2020-03-12 DIAGNOSIS — C50511 Malignant neoplasm of lower-outer quadrant of right female breast: Secondary | ICD-10-CM

## 2020-03-15 DIAGNOSIS — M2032 Hallux varus (acquired), left foot: Secondary | ICD-10-CM | POA: Diagnosis not present

## 2020-03-15 DIAGNOSIS — L603 Nail dystrophy: Secondary | ICD-10-CM | POA: Diagnosis not present

## 2020-03-16 ENCOUNTER — Ambulatory Visit
Admission: RE | Admit: 2020-03-16 | Discharge: 2020-03-16 | Disposition: A | Discharge status: Home / Self Care | Payer: PPO | Source: Ambulatory Visit | Attending: Oncology | Admitting: Oncology

## 2020-03-16 ENCOUNTER — Ambulatory Visit: Admission: RE | Admit: 2020-03-16 | Payer: PPO | Source: Ambulatory Visit

## 2020-03-16 ENCOUNTER — Telehealth: Payer: Self-pay | Admitting: *Deleted

## 2020-03-16 ENCOUNTER — Encounter: Payer: Self-pay | Admitting: *Deleted

## 2020-03-16 DIAGNOSIS — C50511 Malignant neoplasm of lower-outer quadrant of right female breast: Secondary | ICD-10-CM | POA: Diagnosis not present

## 2020-03-16 DIAGNOSIS — M8589 Other specified disorders of bone density and structure, multiple sites: Secondary | ICD-10-CM | POA: Insufficient documentation

## 2020-03-16 DIAGNOSIS — Z171 Estrogen receptor negative status [ER-]: Secondary | ICD-10-CM | POA: Insufficient documentation

## 2020-03-16 DIAGNOSIS — C50311 Malignant neoplasm of lower-inner quadrant of right female breast: Secondary | ICD-10-CM | POA: Diagnosis not present

## 2020-03-16 DIAGNOSIS — Z780 Asymptomatic menopausal state: Secondary | ICD-10-CM | POA: Diagnosis not present

## 2020-03-16 DIAGNOSIS — Z510 Encounter for antineoplastic radiation therapy: Secondary | ICD-10-CM | POA: Diagnosis not present

## 2020-03-16 MED ORDER — FUROSEMIDE 20 MG PO TABS: 20.0000 mg | ORAL_TABLET | Freq: Every day | ORAL | 0 refills | Status: DC

## 2020-03-16 NOTE — Telephone Encounter (Signed)
Patient informed of prescription and confirmed [facility] is where it is to be sent. I advised her to call back if no improvement next week

## 2020-03-16 NOTE — Telephone Encounter (Signed)
Patient called and left voice mail that her swelling has increased and she needs to be seen. I attempted to return her call, but her phone is not working correctly and all I get is a buzzing noise. She has a BMD and radiation therapy scheduled for this morning. Please advise

## 2020-03-16 NOTE — Research (Signed)
Patient Debra Little returns to clinic as scheduled this morning for her 12 week study assessment for the SWOG S1714 study. She has completed her Taxol therapy as of 03/02/2020 and does not have to see Debra Little today. Debra Little did complete the week 12 Physician Treatment Burden scale for the patient's current status. Debra Little week 36 [id number] Neuropathy Assessment was completed by this RN assisted by Debra Fruit, RN. Patient is right side dominant, so the neuropen and tuning fork assessments were completed on the right lower and upper extremities. Debra Little also completed all of the week 12 study questionnaires including EORTC, CIPN20, PROMIS-29, GSLTPAQ, Patient Reported Symptom Burden, and the new FACT/GOG-NTX.  Debra Little denies having experienced any recent falls, but does report she feels "a little off balance" at times. She was supposed to be seen by orthopedics today, but cancelled this appointment reporting that her left knee pain is much better now. States she takes Tylenol Arthritis on occasion for the knee pain. Patient reports that she is experiencing some increased neuropathy symptoms - which are a little worse overall and reports sensory neuropathy is worse in her feet than her hands, but she is experiencing more difficulty grasping and gripping with her hands now, and occasional difficulty with buttons. The numbness and tingling are now constant and she has a new stabbing and burning pain on the bottoms of feet bilaterally as well as her left great toe, and this is intermittent, but does affect her balance at times. Patient also reports a new "itching sensation" which occurs all over, but mostly in her right chest around her incision site. States she took some Benadryl last night with relief of her symptoms. Debra Little reports she saw her podiatrist yesterday regarding the left great toenail, which she fears may come off, but has thickened and become painful at times. Patient still  reports she is able to manage all of her ADLs without any assistance. Solicited CTCAE Assessment as noted below with grade and attribution verified with Debra Little. She still has 1+ pitting edema in her feet and calves this morning and reports she has experienced this since beginning chemotherapy, but not to this degree. Patient also reports some swelling in her stomach, and Debra Little just prescribed some Lasix for her today. She has not yet started the Lasix, but Debra Little plans to re-evaluate her after she has been on the Lasix for a few days. She is also experiencing some Lymphedema in her right arm, and is seeing OT regularly for this. Reports she has a lymphedema sleeve, and is being fitted for a new lymphedema sleeve by Debra Little this week. Patient was referred back to her Cardiologist, Debra Little by her Podiatrist, Debra Little yesterday, and she will see Debra Little on Monday - 03/22/2020. Debra Little states she will defer to the Cardiologist regarding whether the patient requires an Echocardiogram at this time. Debra Little reports a long history of Fibromyalgia and has been treated with gabapentin since 05/18/2011. Solicited AEs with grade and attribution as noted below:  Study/Protocol: SWOG [id number] Cycle: 12 week solicited AEs Event Grade Onset Date Resolved Date Drug Name Attribution Treatment Comments  Dysesthesia 1 05/18/2011   unrelated gabapentin Reported at baseline  Neuralgia 0        Paresthesia 1 05/18/2011   unrelated gabapentin Reported at baseline  Peripheral motor neuropathy 0        Peripheral sensory neuropathy 2 1 03/16/2020 05/18/2011   possibly gabapentin Reported at  baseline, a little more increased with paclitaxel  Localized edema BLE 2 03/16/2020   probably New Lasix today Was experiencing pedal edema last visit  Debra Little, BSN, [place], OCN 03/16/2020 11:31 AM

## 2020-03-16 NOTE — Telephone Encounter (Signed)
We will give her 20 mg lasix for 1 week and see if it helps. Leg swelling can be seen post taxol and should resolve in a couple of weeks.

## 2020-03-16 NOTE — Telephone Encounter (Signed)
Patient called back and states that swelling is in both legs up to calf by end of day. States her stomach also feels swollen. Right arm is also swelling, but she is seeing Gwenette Greet for her arm, mostly concerned about her ankles and feet.

## 2020-03-17 ENCOUNTER — Other Ambulatory Visit: Payer: Self-pay | Admitting: Unknown Physician Specialty

## 2020-03-17 ENCOUNTER — Ambulatory Visit
Admission: RE | Admit: 2020-03-17 | Discharge: 2020-03-17 | Disposition: A | Discharge status: Home / Self Care | Payer: PPO | Source: Ambulatory Visit | Attending: Radiation Oncology | Admitting: Radiation Oncology

## 2020-03-17 DIAGNOSIS — E041 Nontoxic single thyroid nodule: Secondary | ICD-10-CM

## 2020-03-17 DIAGNOSIS — Z510 Encounter for antineoplastic radiation therapy: Secondary | ICD-10-CM | POA: Diagnosis not present

## 2020-03-17 DIAGNOSIS — J340 Abscess, furuncle and carbuncle of nose: Secondary | ICD-10-CM | POA: Diagnosis not present

## 2020-03-17 DIAGNOSIS — C50311 Malignant neoplasm of lower-inner quadrant of right female breast: Secondary | ICD-10-CM | POA: Diagnosis not present

## 2020-03-18 ENCOUNTER — Other Ambulatory Visit: Payer: Self-pay

## 2020-03-18 ENCOUNTER — Encounter: Payer: Self-pay | Admitting: Occupational Therapy

## 2020-03-18 ENCOUNTER — Ambulatory Visit
Admission: RE | Admit: 2020-03-18 | Discharge: 2020-03-18 | Disposition: A | Discharge status: Home / Self Care | Payer: PPO | Source: Ambulatory Visit | Attending: Radiation Oncology | Admitting: Radiation Oncology

## 2020-03-18 ENCOUNTER — Ambulatory Visit: Payer: PPO | Attending: Oncology | Admitting: Occupational Therapy

## 2020-03-18 DIAGNOSIS — R350 Frequency of micturition: Secondary | ICD-10-CM | POA: Diagnosis not present

## 2020-03-18 DIAGNOSIS — Z510 Encounter for antineoplastic radiation therapy: Secondary | ICD-10-CM | POA: Diagnosis not present

## 2020-03-18 DIAGNOSIS — I972 Postmastectomy lymphedema syndrome: Secondary | ICD-10-CM | POA: Insufficient documentation

## 2020-03-18 NOTE — Patient Instructions (Addendum)
Wear at the moment Tubigrip D on hand to elbow over istoner glove  And then tubigrip D from mid forearm to axilla  Most all the time   Manual Lymph Drainage to do am and pm  Do manual lymph drainage once each day to help decrease swelling.  This should take you about 30 minutes depending on the size of your limb.  For Right Arm:  Felicie Morn yourself at the base of your neck and do 8 small circles, and 2 fingers behind clavicle 8 x  . Do 8 semicircles at left armpit and right groin . Pump across chest from right to left 8 times . Pump down the right side of trunk from armpit to groin 8 times . Pump up the outside of right upper arm 8 times . Pump across chest from R to L 8 times . Pump  down the right side of trunk from armpit to groin 8 times . Pump top of forearm from wrist to elbow 8 times . Pump up the outside of right upper arm 8 times . Pump across chest from right to left 8 times . Pump down the right side of trunk from armpit to groin 8 times . Pump up the back of the forearm from wrist to elbow 8 times . Pump up the outside of right upper arm 8 times . Pump across chest from right to left 8 times . Pump down the right side of trunk from armpit to groin 8 times . Do 8 semicircles at left armpit and right groin 8 times . Repeat nr.1  SLOW and LIGHT with only your palm NOT FINGERTIPS If help at home can replace or do also massage over upper back from right to left , when doing chest from right to left.   Will check into insurance for pump and different sleeve for better containment

## 2020-03-18 NOTE — Therapy (Signed)
[facility] [address], [place], [address] Phone: [phone]   Fax:  [phone]  Occupational Therapy Evaluation  Patient Details  Name: Debra Little MRN: [id number] Date of Birth: 17-Oct-1956 Referring Provider (OT): Dr Janese Banks   Encounter Date: 03/18/2020  OT End of Session - 03/18/20 1525    Visit Number  1    Number of Visits  6    Date for OT Re-Evaluation  05/13/20    OT Start Time  1430    OT Stop Time  1514    OT Time Calculation (min)  44 min    Activity Tolerance  Patient tolerated treatment well    Behavior During Therapy  [facility] for tasks assessed/performed       Past Medical History:  Diagnosis Date  . Anemia    d/t chemo - last Hgb 10.8  . Anxiety    sees Dr. [name]  . Breast cancer ([place]) 2020  . Cyst of breast    per [name]  . Dysrhythmia    Hx of palpitations  . Family history of breast cancer   . Family history of colon cancer   . Family history of leukemia   . Family history of ovarian cancer   . Fibromyalgia   . GERD (gastroesophageal reflux disease)   . Heart murmur   . Hemorrhoids   . Herpes, genital 04/2015   confirmed with HSV 2 IgG  . Hiatal hernia   . Hypercholesterolemia    Dr. Kyra Searles  . Hyperlipidemia   . IBS (irritable bowel syndrome)    constipation predominant  . IC (interstitial cystitis)    per [facility]  . Mild depression ([place])   . MVP (mitral valve prolapse)    [employer] cards eval 2012  . Osteopenia    2010/2017, DEXA at [facility];spine and fem neck  . PONV (postoperative nausea and vomiting)   . Vitamin D deficiency    history of    Past Surgical History:  Procedure Laterality Date  . ABDOMINAL HYSTERECTOMY    . APPENDECTOMY    . BLADDER SURGERY     1980's  . BREAST EXCISIONAL BIOPSY Right    benign  . BREAST EXCISIONAL BIOPSY Bilateral    benign  . COLONOSCOPY  09/2014   Dr. [name]  . COLONOSCOPY  2009   at [facility] 1 POLYP  (BENIGN)  . excision of breast cysts     hx of multiple cyst aspirations  . EXPLORATORY LAPAROTOMY    . MASTECTOMY MODIFIED RADICAL Bilateral 10/09/2019   Procedure: RIGHT MASTECTOMY MODIFIED RADICAL AND LEFT TOTAL MASTECTOMY;  Surgeon: Rolm Bookbinder, MD;  Location: [place];  Service: General;  Laterality: Bilateral;  . PORTA CATH INSERTION N/A 08/06/2019   Procedure: PORTA CATH INSERTION;  Surgeon: Katha Cabal, MD;  Location: [place] [facility];  Service: Cardiovascular;  Laterality: N/A;  . PORTACATH PLACEMENT Left 08/05/2019   Procedure: INSERTION PORT-A-CATH, Attempted;  Surgeon: Jules Husbands, MD;  Location: [facility];  Service: General;  Laterality: Left;  . TUBAL LIGATION      There were no vitals filed for this visit.  Subjective Assessment - 03/18/20 1517    Subjective   I had my 2nd session of radiation today - and then Dr Janese Banks started me on fluidpill yesterday - I called her and said my feet/ankles swollen and stomach since chemo - my arm is still swollen    Pertinent History  Patient  is a 64 y.o. female with clinical prognostic stage IIIb cT2 cN1 M0 triple negative invasive mammary carcinoma of the right breast.   She is s/p 3 cycles of neoadjuvant AC Keytruda chemotherapy followed by bilateral mastectomy and targeted node dissection due to no significant response to neoadjuvant chemotherapy.   She went on to get bilateral mastectomy and node dissection which showed stage IIIc disease pT 2 pN2 a.   She is here for on treatment assessment prior to cycle 12 of weekly Taxol chemotherapy Counts okay to proceed with cycle 12 of weekly Taxol chemotherapy today.  This would be her last chemo.  She is seeing Dr. Baruch Gouty to discuss adjuvant radiation treatment at this time Patient had stage III metaplastic triple negative breast cancer in her right breast which was poorly responsive to chemotherapy and does have a high risk of recurrence.  We will do a repeat CT chest abdomen and pelvis  which did not show any evidence of recurrent disease at this time.  Moving forward we will have a low threshold to get a scan if she develops any concerning signs and symptoms. Patient also noted to have ER positive DCIS on biopsy in her left breast which was not seen on final pathology.  However given the ER positive status she would benefit from adjuvant hormone therapy.  I would like her to start hormone therapy upon completion of radiation treatment.Discussed risks and benefits of letrozole including all but not limited to fatigue, mood swings, arthralgias, worsening bone health.    Patient Stated Goals  I don't my R arm to get large - want to keep swelling under control    Currently in Pain?  No/denies        [facility] OT Assessment - 03/18/20 0001      Assessment   Medical Diagnosis  R UE and thoracic lymphedema     Referring Provider (OT)  Dr Janese Banks    Onset Date/Surgical Date  10/28/19    Hand Dominance  Right      Home  Environment   Lives With  Other (Comment)       LYMPHEDEMA/ONCOLOGY QUESTIONNAIRE - 03/18/20 1440      Right Upper Extremity Lymphedema   15 cm Proximal to Olecranon Process  29 cm    10 cm Proximal to Olecranon Process  27.5 cm    Olecranon Process  24.4 cm    15 cm Proximal to Ulnar Styloid Process  24 cm    10 cm Proximal to Ulnar Styloid Process  20.3 cm    Just Proximal to Ulnar Styloid Process  15.8 cm    Across Hand at [employer]  18 cm    At [place] of 2nd Digit  6 cm    At [facility] of Thumb  6 cm          Wear at the moment Tubigrip D on hand to elbow over istoner glove  And then tubigrip D from mid forearm to axilla  Most all the time   Manual Lymph Drainage to do am and pm  Do manual lymph drainage once each day to help decrease swelling.  This should take you about 30 minutes depending on the size of your limb.  For Right Arm:  Felicie Morn yourself at the base of your neck and do 8 small circles, and 2 fingers behind clavicle 8 x  . Do 8  semicircles at left armpit and right groin . Pump across chest from right to  left 8 times . Pump down the right side of trunk from armpit to groin 8 times . Pump up the outside of right upper arm 8 times . Pump across chest from R to L 8 times . Pump  down the right side of trunk from armpit to groin 8 times . Pump top of forearm from wrist to elbow 8 times . Pump up the outside of right upper arm 8 times . Pump across chest from right to left 8 times . Pump down the right side of trunk from armpit to groin 8 times . Pump up the back of the forearm from wrist to elbow 8 times . Pump up the outside of right upper arm 8 times . Pump across chest from right to left 8 times . Pump down the right side of trunk from armpit to groin 8 times . Do 8 semicircles at left armpit and right groin 8 times . Repeat nr.1  SLOW and LIGHT with only your palm NOT FINGERTIPS If help at home can replace or do also massage over upper back from right to left , when doing chest from right to left.   Will check into insurance for pump and different sleeve for better containment           OT Education - 03/18/20 1525    Education Details  findings of eval and HEP    Person(s) Educated  Patient    Methods  Explanation;Demonstration;Tactile cues;Verbal cues    Comprehension  Verbal cues required;Returned demonstration;Verbalized understanding          OT Long Term Goals - 03/18/20 1535      OT LONG TERM GOAL #1   Title  Pt to be independent in HEP to do self MLD/pump and wearing of compression garments to maintain her lymphedema under control in L UE and thoracic    Baseline  show increase lymphedema - circumference in R UE since done with chemo and starting radiation this week    Time  8    Period  Weeks    Status  New    Target Date  05/13/20            Plan - 03/18/20 1529    Clinical Impression Statement  Pt was diagnosis summer of 2020 with brease CA , had chemo and  bilateral  mastectomy with 7 ln on R posiitive - was seen by this OT for ROM , scar tissue and swelling Dec thru March - was doing preventitive sleeve and jovipak breast pad as needed - but she return after finishing chemo in April and her R UE circumference increase compare to L UE more than 2 cm wrist to upper arm - fibrotic changes in forearm - she has some abdominal edema and in feet and LE - put on fluid pill put on yesterday - she started this week radiation - would recommend for pt a pump to use at home and different daytime compression sleeve to wear with better containment - pt can benefit from OT services    OT Occupational Profile and History  Problem Focused Assessment - Including review of records relating to presenting problem    Occupational performance deficits (Please refer to evaluation for details):  ADL's;IADL's;Play;Leisure    Body Structure / Function / Physical Skills  Edema;Strength;ROM;UE functional use    Rehab Potential  Good    Clinical Decision Making  Limited treatment options, no task modification necessary    Comorbidities Affecting Occupational Performance:  May have comorbidities impacting occupational performance    Modification or Assistance to Complete Evaluation   No modification of tasks or assist necessary to complete eval    OT Frequency  1x / week    OT Duration  8 weeks    OT Treatment/Interventions  Self-care/ADL training;Manual lymph drainage;Patient/family education;Compression bandaging;Manual Therapy    Plan  assses circumfernce and check with Reps about pump and compression    Consulted and Agree with Plan of Care  Patient       Patient will benefit from skilled therapeutic intervention in order to improve the following deficits and impairments:   Body Structure / Function / Physical Skills: Edema, Strength, ROM, UE functional use       Visit Diagnosis: Postmastectomy lymphedema syndrome - Plan: Ot plan of care cert/re-cert    Problem List Patient  Active Problem List   Diagnosis Date Noted  . Ductal carcinoma in situ (DCIS) of left breast 03/04/2020  . Bilateral breast cancer ([place]) 10/09/2019  . Genetic testing 08/27/2019  . Family history of breast cancer   . Family history of ovarian cancer   . Family history of colon cancer   . Family history of leukemia   . Goals of care, counseling/discussion 08/07/2019  . Breast cancer ([place]) 07/30/2019  . GERD (gastroesophageal reflux disease) 06/30/2019  . Abdominal discomfort 05/15/2019  . Advance care planning 07/14/2018  . Health care maintenance 07/14/2018  . Depression, recurrent ([place]) 07/14/2018  . Constipation 07/14/2018  . Altered taste 07/14/2018  . Fibrocystic breast changes of both breasts 01/24/2018  . Vasomotor symptoms due to menopause 09/05/2017  . Herpes simplex vulvovaginitis 09/05/2017  . Skin nodule 05/25/2017  . Encounter for screening examination for infectious disease 03/18/2017  . Lipoma 03/18/2017  . Vaginitis 03/18/2017  . Radicular pain in left arm 07/27/2015  . Neuralgia and neuritis, unspecified 07/27/2015  . Fatigue 03/05/2015  . B12 deficiency 03/05/2015  . Skin rash 05/01/2014  . MVP (mitral valve prolapse) 04/30/2014  . Cyst of breast 04/05/2014  . Interstitial cystitis 03/31/2014  . Routine general medical examination at a health care facility 11/13/2011  . Anxiety and depression 10/06/2010  . BREAST CYSTS, BILATERAL 12/03/2008  . PALPITATIONS, CHRONIC 12/03/2008  . UNSPECIFIED VITAMIN D DEFICIENCY 09/10/2008  . HLD (hyperlipidemia) 09/10/2008  . IRRITABLE BOWEL SYNDROME 02/12/2008    Rosalyn Gess 03/18/2020, 3:37 PM  [facility] [address], [place], [address] Phone: [phone]   Fax:  [phone]  Name: Debra Little MRN: [id number] Date of Birth: December 16, 1955

## 2020-03-19 ENCOUNTER — Ambulatory Visit
Admission: RE | Admit: 2020-03-19 | Discharge: 2020-03-19 | Disposition: A | Discharge status: Home / Self Care | Payer: PPO | Source: Ambulatory Visit | Attending: Radiation Oncology | Admitting: Radiation Oncology

## 2020-03-19 ENCOUNTER — Ambulatory Visit: Payer: PPO

## 2020-03-19 DIAGNOSIS — Z510 Encounter for antineoplastic radiation therapy: Secondary | ICD-10-CM | POA: Diagnosis not present

## 2020-03-19 DIAGNOSIS — C50311 Malignant neoplasm of lower-inner quadrant of right female breast: Secondary | ICD-10-CM | POA: Diagnosis not present

## 2020-03-22 ENCOUNTER — Ambulatory Visit: Payer: PPO | Admitting: Oncology

## 2020-03-22 ENCOUNTER — Ambulatory Visit
Admission: RE | Admit: 2020-03-22 | Discharge: 2020-03-22 | Disposition: A | Discharge status: Home / Self Care | Payer: PPO | Source: Ambulatory Visit | Attending: Radiation Oncology | Admitting: Radiation Oncology

## 2020-03-22 DIAGNOSIS — C50311 Malignant neoplasm of lower-inner quadrant of right female breast: Secondary | ICD-10-CM | POA: Diagnosis not present

## 2020-03-22 DIAGNOSIS — R002 Palpitations: Secondary | ICD-10-CM | POA: Diagnosis not present

## 2020-03-22 DIAGNOSIS — E782 Mixed hyperlipidemia: Secondary | ICD-10-CM | POA: Diagnosis not present

## 2020-03-22 DIAGNOSIS — I341 Nonrheumatic mitral (valve) prolapse: Secondary | ICD-10-CM | POA: Diagnosis not present

## 2020-03-22 DIAGNOSIS — R600 Localized edema: Secondary | ICD-10-CM | POA: Diagnosis not present

## 2020-03-22 DIAGNOSIS — Z510 Encounter for antineoplastic radiation therapy: Secondary | ICD-10-CM | POA: Diagnosis not present

## 2020-03-23 ENCOUNTER — Ambulatory Visit
Admission: RE | Admit: 2020-03-23 | Discharge: 2020-03-23 | Disposition: A | Discharge status: Home / Self Care | Payer: PPO | Source: Ambulatory Visit | Attending: Radiation Oncology | Admitting: Radiation Oncology

## 2020-03-23 DIAGNOSIS — Z510 Encounter for antineoplastic radiation therapy: Secondary | ICD-10-CM | POA: Diagnosis not present

## 2020-03-23 DIAGNOSIS — C50311 Malignant neoplasm of lower-inner quadrant of right female breast: Secondary | ICD-10-CM | POA: Diagnosis not present

## 2020-03-24 ENCOUNTER — Ambulatory Visit
Admission: RE | Admit: 2020-03-24 | Discharge: 2020-03-24 | Disposition: A | Discharge status: Home / Self Care | Payer: PPO | Source: Ambulatory Visit | Attending: Radiation Oncology | Admitting: Radiation Oncology

## 2020-03-24 ENCOUNTER — Telehealth: Payer: Self-pay | Admitting: *Deleted

## 2020-03-24 ENCOUNTER — Ambulatory Visit
Admission: RE | Admit: 2020-03-24 | Discharge: 2020-03-24 | Disposition: A | Discharge status: Home / Self Care | Payer: PPO | Source: Ambulatory Visit | Attending: Unknown Physician Specialty | Admitting: Unknown Physician Specialty

## 2020-03-24 ENCOUNTER — Other Ambulatory Visit: Payer: Self-pay

## 2020-03-24 DIAGNOSIS — Z8639 Personal history of other endocrine, nutritional and metabolic disease: Secondary | ICD-10-CM | POA: Diagnosis not present

## 2020-03-24 DIAGNOSIS — E041 Nontoxic single thyroid nodule: Secondary | ICD-10-CM | POA: Insufficient documentation

## 2020-03-24 DIAGNOSIS — C50311 Malignant neoplasm of lower-inner quadrant of right female breast: Secondary | ICD-10-CM | POA: Diagnosis not present

## 2020-03-24 DIAGNOSIS — Z510 Encounter for antineoplastic radiation therapy: Secondary | ICD-10-CM | POA: Diagnosis not present

## 2020-03-24 NOTE — Telephone Encounter (Signed)
Patient called asking about results of BMD. We discussed that she should take calcium and vitamin D. She states she already takes Vitamin D 5000 units daily and she stated she will start taking Calcium 1200 mg daily. She also reports that she saw cardiology and that they are going to continue her lasix for 4 more weeks.  EXAM: DUAL X-RAY ABSORPTIOMETRY (DXA) FOR BONE MINERAL DENSITY  IMPRESSION: Dear Dr. Janese Banks,  Your patient Debra Little completed a BMD test on 03/16/2020 using the [place] (software version: 14.10) manufactured by [employer]. The following summarizes the results of our evaluation. Technologist:VLM  PATIENT BIOGRAPHICAL: Name: Debra, Little Patient ID: [id number] Birth Date: [birth year] Height: 66.5 in. Gender: Female Exam Date: 03/16/2020 Weight: 158.0 lbs. Indications: Caucasian, History of Breast Cancer, History of Chemo, Hysterectomy, Oophorectomy Bilateral, Postmenopausal Fractures: Treatments: Vitamin D  DENSITOMETRY RESULTS: Site      Region     Measured Date Measured Age WHO Classification Young Adult T-score BMD         %Change vs. Previous Significant Change (*)  AP Spine L1-L4 03/16/2020 64 Osteopenia -1.9 0.956 g/cm2 - -  DualFemur Neck Right 03/16/2020 64 Osteopenia -1.8 0.785 g/cm2 - -  ASSESSMENT: The BMD measured at AP Spine L1-L4 is 0.956 g/cm2 with a T-score of -1.9.  This patient is considered OSTEOPENIC according to [facility]) criteria.  The scan quality is good.  World Pharmacologist [facility]) criteria for post-menopausal, Caucasian Women: Normal:                   T-score at or above -1 SD Osteopenia/low bone mass: T-score between -1 and -2.5 SD Osteoporosis:             T-score at or below -2.5 SD  RECOMMENDATIONS: 1. All patients should optimize calcium and vitamin D intake. 2. Consider FDA-approved medical therapies in postmenopausal women and men aged 67  years and older, based on the following: a. A hip or vertebral(clinical or morphometric) fracture b. T-score < -2.5 at the femoral neck or spine after appropriate evaluation to exclude secondary causes c. Low bone mass (T-score between -1.0 and -2.5 at the femoral neck or spine) and a 10-year probability of a hip fracture > 3% or a 10-year probability of a major osteoporosis-related fracture > 20% based on the [place]-adapted WHO algorithm 3. Clinician judgment and/or patient preferences may indicate treatment for people with 10-year fracture probabilities above or below these levels  FOLLOW-UP: People with diagnosed cases of osteoporosis or at high risk for fracture should have regular bone mineral density tests. For patients eligible for Medicare, routine testing is allowed once every 2 years. The testing frequency can be increased to one year for patients who have rapidly progressing disease, those who are receiving or discontinuing medical therapy to restore bone mass, or have additional risk factors.  I have reviewed this report, and agree with the above findings.  Mark A. Thornton Papas, M.D. [facility]  Your patient Debra Little completed a FRAX assessment on 03/16/2020 using the [place] (analysis version: 14.10) manufactured by [employer]. The following summarizes the results of our evaluation.  PATIENT BIOGRAPHICAL: Name: Debra, Little Patient ID: [id number] Birth Date: 30-May-1956 Height:    66.5 in. Gender:     Female   Age:        64       Weight:    158.0 lbs. Ethnicity:  White  Exam Date: 03/16/2020  FRAX* RESULTS:  (version: 3.5) 10-year Probability of Fracture1 Major Osteoporotic Fracture2 Hip Fracture 9.5% 1.1% Population: [place] (Caucasian) Risk Factors: None  Based on Femur (Right) Neck BMD  1 -The 10-year probability of fracture may be lower than reported if the patient has received treatment. 2  -Major Osteoporotic Fracture: Clinical Spine, Forearm, Hip or Shoulder  *FRAX is a Materials engineer of the [employer] for Metabolic Bone Disease, a Jack (WHO) [facility].  ASSESSMENT: The probability of a major osteoporotic fracture is 9.5% within the next ten years. The probability of a hip fracture is 1.1% within the next ten years.  I have reviewed this report and agree with the above findings.  Mark A. Thornton Papas, M.D.  [facility]   Electronically Signed   By: Lavonia Dana M.D.   On: 03/16/2020 17:13

## 2020-03-25 ENCOUNTER — Ambulatory Visit
Admission: RE | Admit: 2020-03-25 | Discharge: 2020-03-25 | Disposition: A | Discharge status: Home / Self Care | Payer: PPO | Source: Ambulatory Visit | Attending: Radiation Oncology | Admitting: Radiation Oncology

## 2020-03-25 DIAGNOSIS — Z510 Encounter for antineoplastic radiation therapy: Secondary | ICD-10-CM | POA: Diagnosis not present

## 2020-03-25 DIAGNOSIS — R350 Frequency of micturition: Secondary | ICD-10-CM | POA: Diagnosis not present

## 2020-03-25 DIAGNOSIS — C50311 Malignant neoplasm of lower-inner quadrant of right female breast: Secondary | ICD-10-CM | POA: Diagnosis not present

## 2020-03-25 NOTE — Telephone Encounter (Signed)
Call returned to patient. I left message on voice mail that doctor will discuss her BMD in detail at her next appointment, or if she wishes, Dr Janese Banks can see her next week to discuss and to cal back if she wants appointment for next week

## 2020-03-25 NOTE — Telephone Encounter (Signed)
Please let patient know I will discuss bone density in detail at her next visit. If she wishes to discuss sooner, please make an appointment for the same next week

## 2020-03-26 ENCOUNTER — Ambulatory Visit: Payer: PPO | Admitting: Occupational Therapy

## 2020-03-26 ENCOUNTER — Ambulatory Visit
Admission: RE | Admit: 2020-03-26 | Discharge: 2020-03-26 | Disposition: A | Discharge status: Home / Self Care | Payer: PPO | Source: Ambulatory Visit | Attending: Radiation Oncology | Admitting: Radiation Oncology

## 2020-03-26 ENCOUNTER — Other Ambulatory Visit: Payer: Self-pay

## 2020-03-26 ENCOUNTER — Other Ambulatory Visit: Payer: Self-pay | Admitting: Unknown Physician Specialty

## 2020-03-26 DIAGNOSIS — Z510 Encounter for antineoplastic radiation therapy: Secondary | ICD-10-CM | POA: Diagnosis not present

## 2020-03-26 DIAGNOSIS — E041 Nontoxic single thyroid nodule: Secondary | ICD-10-CM

## 2020-03-26 DIAGNOSIS — I972 Postmastectomy lymphedema syndrome: Secondary | ICD-10-CM | POA: Diagnosis not present

## 2020-03-26 NOTE — Therapy (Signed)
[facility] [address], [place], [address] Phone: [phone]   Fax:  [phone]  Occupational Therapy Treatment  Patient Details  Name: Debra Little MRN: [id number] Date of Birth: July 31, 1956 Referring Provider (OT): Dr Janese Banks   Encounter Date: 03/26/2020  OT End of Session - 03/26/20 1253    Visit Number  2    Number of Visits  6    Date for OT Re-Evaluation  05/13/20    OT Start Time  [id number]    OT Stop Time  1250    OT Time Calculation (min)  46 min    Activity Tolerance  Patient tolerated treatment well    Behavior During Therapy  [facility] for tasks assessed/performed       Past Medical History:  Diagnosis Date  . Anemia    d/t chemo - last Hgb 10.8  . Anxiety    sees Dr. [name]  . Breast cancer ([place]) 2020  . Cyst of breast    per [name]  . Dysrhythmia    Hx of palpitations  . Family history of breast cancer   . Family history of colon cancer   . Family history of leukemia   . Family history of ovarian cancer   . Fibromyalgia   . GERD (gastroesophageal reflux disease)   . Heart murmur   . Hemorrhoids   . Herpes, genital 04/2015   confirmed with HSV 2 IgG  . Hiatal hernia   . Hypercholesterolemia    Dr. Kyra Searles  . Hyperlipidemia   . IBS (irritable bowel syndrome)    constipation predominant  . IC (interstitial cystitis)    per [place] u[facility]  . Mild depression ([place])   . MVP (mitral valve prolapse)    [facility] cards eval 2012  . Osteopenia    2010/2017, DEXA at [facility];spine and fem neck  . PONV (postoperative nausea and vomiting)   . Vitamin D deficiency    history of    Past Surgical History:  Procedure Laterality Date  . ABDOMINAL HYSTERECTOMY    . APPENDECTOMY    . BLADDER SURGERY     1980's  . BREAST EXCISIONAL BIOPSY Right    benign  . BREAST EXCISIONAL BIOPSY Bilateral    benign  . COLONOSCOPY  09/2014   Dr. [name]  . COLONOSCOPY  2009   at [facility] 1 POLYP (BENIGN)   . excision of breast cysts     hx of multiple cyst aspirations  . EXPLORATORY LAPAROTOMY    . MASTECTOMY MODIFIED RADICAL Bilateral 10/09/2019   Procedure: RIGHT MASTECTOMY MODIFIED RADICAL AND LEFT TOTAL MASTECTOMY;  Surgeon: Rolm Bookbinder, MD;  Location: [place];  Service: General;  Laterality: Bilateral;  . PORTA CATH INSERTION N/A 08/06/2019   Procedure: PORTA CATH INSERTION;  Surgeon: Katha Cabal, MD;  Location: [place] [facility];  Service: Cardiovascular;  Laterality: N/A;  . PORTACATH PLACEMENT Left 08/05/2019   Procedure: INSERTION PORT-A-CATH, Attempted;  Surgeon: Jules Husbands, MD;  Location: [facility];  Service: General;  Laterality: Left;  . TUBAL LIGATION      There were no vitals filed for this visit.  Subjective Assessment - 03/26/20 1251    Subjective   DOing okay -just tired -and my shoulder and arm has been bothring mer since yesterday after radiation -but I did not take my fluid pill- I had neck issues in the past before CA    Pertinent History  Patient is a 64  y.o. female with clinical prognostic stage IIIb cT2 cN1 M0 triple negative invasive mammary carcinoma of the right breast.   She is s/p 3 cycles of neoadjuvant AC Keytruda chemotherapy followed by bilateral mastectomy and targeted node dissection due to no significant response to neoadjuvant chemotherapy.   She went on to get bilateral mastectomy and node dissection which showed stage IIIc disease pT 2 pN2 a.   She is here for on treatment assessment prior to cycle 12 of weekly Taxol chemotherapy Counts okay to proceed with cycle 12 of weekly Taxol chemotherapy today.  This would be her last chemo.  She is seeing Dr. Baruch Gouty to discuss adjuvant radiation treatment at this time Patient had stage III metaplastic triple negative breast cancer in her right breast which was poorly responsive to chemotherapy and does have a high risk of recurrence.  We will do a repeat CT chest abdomen and pelvis which did not show any  evidence of recurrent disease at this time.  Moving forward we will have a low threshold to get a scan if she develops any concerning signs and symptoms. Patient also noted to have ER positive DCIS on biopsy in her left breast which was not seen on final pathology.  However given the ER positive status she would benefit from adjuvant hormone therapy.  I would like her to start hormone therapy upon completion of radiation treatment.Discussed risks and benefits of letrozole including all but not limited to fatigue, mood swings, arthralgias, worsening bone health.    Patient Stated Goals  I don't my R arm to get large - want to keep swelling under control    Currently in Pain?  Yes    Pain Score  6    but took tylenol   Pain Location  Arm    Pain Orientation  Right    Pain Descriptors / Indicators  Aching;Heaviness    Pain Type  Acute pain    Pain Onset  Yesterday          LYMPHEDEMA/ONCOLOGY QUESTIONNAIRE - 03/26/20 1218      Right Upper Extremity Lymphedema   15 cm Proximal to Olecranon Process  28.8 cm    10 cm Proximal to Olecranon Process  27 cm    Olecranon Process  24 cm    15 cm Proximal to Ulnar Styloid Process  23.4 cm    10 cm Proximal to Ulnar Styloid Process  20 cm    Just Proximal to Ulnar Styloid Process  15.8 cm    Across Hand at [employer]  18 cm    At [place] of 2nd Digit  6 cm    At [facility] of [facility]  5.8 cm         Pt arrive with herunilateral post mastectomy jovipak breast pad on under her bra - looks good fit Her L arm circumference did decrease this date with her wearing her  Bella strongcompression sleeve during day - and tubigrip at night time - with isotoner glove   -   Report increase shoulder and arm pain since yesterday and heaviness - pt decrease cervical lat flexion - tight in upper traps  And increase tightness  under arm and axilla on Rlimiting her shoulder over head flexion?ABD and external rotationend range-  Pt to do her HEP again  AAROM on wall for shoulder flexion ,ABD and in corner or door gentle externalrotation  And in AM - cervical lat flexion stretch Scapula squeezes few times during day  Monday - get measured for custom Job[employer] Elvarex soft sleeve and glove- did ask for pink ribbon fund assistance  And then pump need to check in               OT Education - 03/26/20 1252    Education Details  progress and plan of care , HEP    Person(s) Educated  Patient    Methods  Explanation;Demonstration;Tactile cues;Verbal cues    Comprehension  Verbal cues required;Returned demonstration;Verbalized understanding          OT Long Term Goals - 03/18/20 1535      OT LONG TERM GOAL #1   Title  Pt to be independent in HEP to do self MLD/pump and wearing of compression garments to maintain her lymphedema under control in L UE and thoracic    Baseline  show increase lymphedema - circumference in R UE since done with chemo and starting radiation this week    Time  8    Period  Weeks    Status  New    Target Date  05/13/20            Plan - 03/26/20 1253    Clinical Impression Statement  Pt was diagnosis summer of 2020 with brease CA , had chemo and  bilateral mastectomy with 7 ln on R posiitive - was seen by this OT for ROM , scar tissue and swelling Dec thru March - was doing preventitive sleeve and jovipak breast pad as needed - but she return after finishing chemo in April and her R UE circumference increase compare to L UE more than 2 cm wrist to upper arm - fibrotic changes in forearm - she has some abdominal edema and in feet and LE - put on fluid pill put on yesterday - she is on her 8th radiation tx per pt  -Pt report increase tightness in over chest and in R shoulder and some increase pain - but tight in cervical this date - reinforce importance of her HEP for ROM to cervical lat flexion  and shoulder flexion ,ABD and ext rotation AAROm - to maintain her motion  - and then her pump was approve per  pt - and plan to get measure next week for custom Jobst Elvarex soft sleeve and glove to contain her lymphedeme better -    OT Occupational Profile and History  Problem Focused Assessment - Including review of records relating to presenting problem    Occupational performance deficits (Please refer to evaluation for details):  ADL's;IADL's;Play;Leisure    Body Structure / Function / Physical Skills  Edema;Strength;ROM;UE functional use    Rehab Potential  Good    Clinical Decision Making  Limited treatment options, no task modification necessary    Comorbidities Affecting Occupational Performance:  May have comorbidities impacting occupational performance    Modification or Assistance to Complete Evaluation   No modification of tasks or assist necessary to complete eval    OT Frequency  1x / week    OT Duration  8 weeks    OT Treatment/Interventions  Self-care/ADL training;Manual lymph drainage;Patient/family education;Compression bandaging;Manual Therapy    Plan  assses circumfernce, ROM  and check with [employer]eps about pump and compression    Consulted and Agree with Plan of Care  Patient       Patient will benefit from skilled therapeutic intervention in order to improve the following deficits and impairments:   Body Structure / Function / Physical Skills: Edema, Strength, ROM, UE  functional use       Visit Diagnosis: Postmastectomy lymphedema syndrome    Problem List Patient Active Problem List   Diagnosis Date Noted  . Ductal carcinoma in situ (DCIS) of left breast 03/04/2020  . Bilateral breast cancer ([place]) 10/09/2019  . Genetic testing 08/27/2019  . Family history of breast cancer   . Family history of ovarian cancer   . Family history of colon cancer   . Family history of leukemia   . Goals of care, counseling/discussion 08/07/2019  . Breast cancer ([place]) 07/30/2019  . GERD (gastroesophageal reflux disease) 06/30/2019  . Abdominal discomfort 05/15/2019  . Advance care  planning 07/14/2018  . Health care maintenance 07/14/2018  . Depression, recurrent ([place]) 07/14/2018  . Constipation 07/14/2018  . Altered taste 07/14/2018  . Fibrocystic breast changes of both breasts 01/24/2018  . Vasomotor symptoms due to menopause 09/05/2017  . Herpes simplex vulvovaginitis 09/05/2017  . Skin nodule 05/25/2017  . Encounter for screening examination for infectious disease 03/18/2017  . Lipoma 03/18/2017  . Vaginitis 03/18/2017  . Radicular pain in left arm 07/27/2015  . Neuralgia and neuritis, unspecified 07/27/2015  . Fatigue 03/05/2015  . B12 deficiency 03/05/2015  . Skin rash 05/01/2014  . MVP (mitral valve prolapse) 04/30/2014  . Cyst of breast 04/05/2014  . Interstitial cystitis 03/31/2014  . Routine general medical examination at a health care facility 11/13/2011  . Anxiety and depression 10/06/2010  . BREAST CYSTS, BILATERAL 12/03/2008  . PALPITATIONS, CHRONIC 12/03/2008  . UNSPECIFIED VITAMIN D DEFICIENCY 09/10/2008  . HLD (hyperlipidemia) 09/10/2008  . IRRITABLE BOWEL SYNDROME 02/12/2008    Rosalyn Gess OTR/l,CLT 03/26/2020, 12:57 PM  [place] [address], [place], [address] Phone: [phone]   Fax:  [phone]  Name: Debra Little MRN: [id number] Date of Birth: Dec 04, 1955

## 2020-03-26 NOTE — Patient Instructions (Signed)
See note

## 2020-03-29 ENCOUNTER — Ambulatory Visit
Admission: RE | Admit: 2020-03-29 | Discharge: 2020-03-29 | Disposition: A | Discharge status: Home / Self Care | Payer: PPO | Source: Ambulatory Visit | Attending: Radiation Oncology | Admitting: Radiation Oncology

## 2020-03-29 ENCOUNTER — Other Ambulatory Visit: Payer: Self-pay | Admitting: Obstetrics and Gynecology

## 2020-03-29 DIAGNOSIS — C50511 Malignant neoplasm of lower-outer quadrant of right female breast: Secondary | ICD-10-CM | POA: Insufficient documentation

## 2020-03-29 DIAGNOSIS — Z510 Encounter for antineoplastic radiation therapy: Secondary | ICD-10-CM | POA: Diagnosis not present

## 2020-03-29 DIAGNOSIS — C50311 Malignant neoplasm of lower-inner quadrant of right female breast: Secondary | ICD-10-CM | POA: Diagnosis not present

## 2020-03-30 ENCOUNTER — Ambulatory Visit
Admission: RE | Admit: 2020-03-30 | Discharge: 2020-03-30 | Disposition: A | Discharge status: Home / Self Care | Payer: PPO | Source: Ambulatory Visit | Attending: Radiation Oncology | Admitting: Radiation Oncology

## 2020-03-30 DIAGNOSIS — C50311 Malignant neoplasm of lower-inner quadrant of right female breast: Secondary | ICD-10-CM | POA: Diagnosis not present

## 2020-03-30 DIAGNOSIS — Z510 Encounter for antineoplastic radiation therapy: Secondary | ICD-10-CM | POA: Diagnosis not present

## 2020-03-31 ENCOUNTER — Ambulatory Visit
Admission: RE | Admit: 2020-03-31 | Discharge: 2020-03-31 | Disposition: A | Discharge status: Home / Self Care | Payer: PPO | Source: Ambulatory Visit | Attending: Radiation Oncology | Admitting: Radiation Oncology

## 2020-03-31 DIAGNOSIS — Z510 Encounter for antineoplastic radiation therapy: Secondary | ICD-10-CM | POA: Diagnosis not present

## 2020-03-31 DIAGNOSIS — C50311 Malignant neoplasm of lower-inner quadrant of right female breast: Secondary | ICD-10-CM | POA: Diagnosis not present

## 2020-04-01 ENCOUNTER — Ambulatory Visit
Admission: RE | Admit: 2020-04-01 | Discharge: 2020-04-01 | Disposition: A | Discharge status: Home / Self Care | Payer: PPO | Source: Ambulatory Visit | Attending: Radiation Oncology | Admitting: Radiation Oncology

## 2020-04-01 ENCOUNTER — Other Ambulatory Visit: Payer: Self-pay

## 2020-04-01 ENCOUNTER — Inpatient Hospital Stay: Payer: PPO | Attending: Oncology

## 2020-04-01 DIAGNOSIS — D0512 Intraductal carcinoma in situ of left breast: Secondary | ICD-10-CM | POA: Diagnosis not present

## 2020-04-01 DIAGNOSIS — Z170 Estrogen receptor positive status [ER+]: Secondary | ICD-10-CM | POA: Diagnosis not present

## 2020-04-01 DIAGNOSIS — Z171 Estrogen receptor negative status [ER-]: Secondary | ICD-10-CM | POA: Diagnosis not present

## 2020-04-01 DIAGNOSIS — C50511 Malignant neoplasm of lower-outer quadrant of right female breast: Secondary | ICD-10-CM

## 2020-04-01 DIAGNOSIS — Z9013 Acquired absence of bilateral breasts and nipples: Secondary | ICD-10-CM | POA: Diagnosis not present

## 2020-04-01 DIAGNOSIS — R350 Frequency of micturition: Secondary | ICD-10-CM | POA: Diagnosis not present

## 2020-04-01 DIAGNOSIS — I972 Postmastectomy lymphedema syndrome: Secondary | ICD-10-CM | POA: Diagnosis not present

## 2020-04-01 DIAGNOSIS — Z510 Encounter for antineoplastic radiation therapy: Secondary | ICD-10-CM | POA: Diagnosis not present

## 2020-04-01 DIAGNOSIS — C50311 Malignant neoplasm of lower-inner quadrant of right female breast: Secondary | ICD-10-CM | POA: Diagnosis not present

## 2020-04-01 LAB — CBC
HCT: 33.7 % — ABNORMAL LOW (ref 36.0–46.0)
Hemoglobin: 11 g/dL — ABNORMAL LOW (ref 12.0–15.0)
MCH: 29.1 pg (ref 26.0–34.0)
MCHC: 32.6 g/dL (ref 30.0–36.0)
MCV: 89.2 fL (ref 80.0–100.0)
Platelets: 206 10*3/uL (ref 150–400)
RBC: 3.78 MIL/uL — ABNORMAL LOW (ref 3.87–5.11)
RDW: 14.4 % (ref 11.5–15.5)
WBC: 8 10*3/uL (ref 4.0–10.5)
nRBC: 0 % (ref 0.0–0.2)

## 2020-04-02 ENCOUNTER — Ambulatory Visit
Admission: RE | Admit: 2020-04-02 | Discharge: 2020-04-02 | Disposition: A | Discharge status: Home / Self Care | Payer: PPO | Source: Ambulatory Visit | Attending: Radiation Oncology | Admitting: Radiation Oncology

## 2020-04-02 DIAGNOSIS — Z510 Encounter for antineoplastic radiation therapy: Secondary | ICD-10-CM | POA: Diagnosis not present

## 2020-04-02 DIAGNOSIS — C50311 Malignant neoplasm of lower-inner quadrant of right female breast: Secondary | ICD-10-CM | POA: Diagnosis not present

## 2020-04-05 ENCOUNTER — Ambulatory Visit
Admission: RE | Admit: 2020-04-05 | Discharge: 2020-04-05 | Disposition: A | Discharge status: Home / Self Care | Payer: PPO | Source: Ambulatory Visit | Attending: Radiation Oncology | Admitting: Radiation Oncology

## 2020-04-05 DIAGNOSIS — C50311 Malignant neoplasm of lower-inner quadrant of right female breast: Secondary | ICD-10-CM | POA: Diagnosis not present

## 2020-04-05 DIAGNOSIS — Z510 Encounter for antineoplastic radiation therapy: Secondary | ICD-10-CM | POA: Diagnosis not present

## 2020-04-06 ENCOUNTER — Ambulatory Visit
Admission: RE | Admit: 2020-04-06 | Discharge: 2020-04-06 | Disposition: A | Discharge status: Home / Self Care | Payer: PPO | Source: Ambulatory Visit | Attending: Radiation Oncology | Admitting: Radiation Oncology

## 2020-04-06 ENCOUNTER — Ambulatory Visit: Payer: PPO | Attending: Oncology | Admitting: Occupational Therapy

## 2020-04-06 ENCOUNTER — Other Ambulatory Visit: Payer: Self-pay

## 2020-04-06 DIAGNOSIS — Z510 Encounter for antineoplastic radiation therapy: Secondary | ICD-10-CM | POA: Diagnosis not present

## 2020-04-06 DIAGNOSIS — I972 Postmastectomy lymphedema syndrome: Secondary | ICD-10-CM | POA: Diagnosis not present

## 2020-04-06 DIAGNOSIS — C50311 Malignant neoplasm of lower-inner quadrant of right female breast: Secondary | ICD-10-CM | POA: Diagnosis not present

## 2020-04-06 NOTE — Patient Instructions (Signed)
See note

## 2020-04-06 NOTE — Therapy (Signed)
[facility] [address], [place], [address] Phone: [phone]   Fax:  [phone]  Occupational Therapy Treatment  Patient Details  Name: Debra Little MRN: [id number] Date of Birth: 1956/06/21 Referring Provider (OT): Dr Janese Banks   Encounter Date: 04/06/2020  OT End of Session - 04/06/20 1135    Visit Number  3    Number of Visits  6    Date for OT Re-Evaluation  05/13/20    OT Start Time  1104    OT Stop Time  1130    OT Time Calculation (min)  26 min    Activity Tolerance  Patient tolerated treatment well    Behavior During Therapy  [facility] for tasks assessed/performed       Past Medical History:  Diagnosis Date  . Anemia    d/t chemo - last Hgb 10.8  . Anxiety    sees Dr. [name]  . Breast cancer ([place]) 2020  . Cyst of breast    per [name]  . Dysrhythmia    Hx of palpitations  . Family history of breast cancer   . Family history of colon cancer   . Family history of leukemia   . Family history of ovarian cancer   . Fibromyalgia   . GERD (gastroesophageal reflux disease)   . Heart murmur   . Hemorrhoids   . Herpes, genital 04/2015   confirmed with HSV 2 IgG  . Hiatal hernia   . Hypercholesterolemia    Dr. Kyra Searles  . Hyperlipidemia   . IBS (irritable bowel syndrome)    constipation predominant  . IC (interstitial cystitis)    per [place] uro  . Mild depression ([place])   . MVP (mitral valve prolapse)    [facility] cards eval 2012  . Osteopenia    2010/2017, DEXA at [facility];spine and fem neck  . PONV (postoperative nausea and vomiting)   . Vitamin D deficiency    history of    Past Surgical History:  Procedure Laterality Date  . ABDOMINAL HYSTERECTOMY    . APPENDECTOMY    . BLADDER SURGERY     1980's  . BREAST EXCISIONAL BIOPSY Right    benign  . BREAST EXCISIONAL BIOPSY Bilateral    benign  . COLONOSCOPY  09/2014   Dr. [name]  . COLONOSCOPY  2009   at [facility] 1 POLYP (BENIGN)   . excision of breast cysts     hx of multiple cyst aspirations  . EXPLORATORY LAPAROTOMY    . MASTECTOMY MODIFIED RADICAL Bilateral 10/09/2019   Procedure: RIGHT MASTECTOMY MODIFIED RADICAL AND LEFT TOTAL MASTECTOMY;  Surgeon: Rolm Bookbinder, MD;  Location: [place];  Service: General;  Laterality: Bilateral;  . PORTA CATH INSERTION N/A 08/06/2019   Procedure: PORTA CATH INSERTION;  Surgeon: Katha Cabal, MD;  Location: [place] [facility];  Service: Cardiovascular;  Laterality: N/A;  . PORTACATH PLACEMENT Left 08/05/2019   Procedure: INSERTION PORT-A-CATH, Attempted;  Surgeon: Jules Husbands, MD;  Location: [facility];  Service: General;  Laterality: Left;  . TUBAL LIGATION      There were no vitals filed for this visit.  Subjective Assessment - 04/06/20 1133    Subjective   I just did not feel to good this am - tired and they said I am starting to get more red on my chest - I can feel reaching with my arm over head is getting more tight    Pertinent History  Patient is a 64 y.o. female with clinical prognostic stage IIIb cT2 cN1 M0 triple negative invasive mammary carcinoma of the right breast.   She is s/p 3 cycles of neoadjuvant AC Keytruda chemotherapy followed by bilateral mastectomy and targeted node dissection due to no significant response to neoadjuvant chemotherapy.   She went on to get bilateral mastectomy and node dissection which showed stage IIIc disease pT 2 pN2 a.   She is here for on treatment assessment prior to cycle 12 of weekly Taxol chemotherapy Counts okay to proceed with cycle 12 of weekly Taxol chemotherapy today.  This would be her last chemo.  She is seeing Dr. Baruch Gouty to discuss adjuvant radiation treatment at this time Patient had stage III metaplastic triple negative breast cancer in her right breast which was poorly responsive to chemotherapy and does have a high risk of recurrence.  We will do a repeat CT chest abdomen and pelvis which did not show any evidence  of recurrent disease at this time.  Moving forward we will have a low threshold to get a scan if she develops any concerning signs and symptoms. Patient also noted to have ER positive DCIS on biopsy in her left breast which was not seen on final pathology.  However given the ER positive status she would benefit from adjuvant hormone therapy.  I would like her to start hormone therapy upon completion of radiation treatment.Discussed risks and benefits of letrozole including all but not limited to fatigue, mood swings, arthralgias, worsening bone health.    Patient Stated Goals  I don't my R arm to get large - want to keep swelling under control    Currently in Pain?  No/denies          LYMPHEDEMA/ONCOLOGY QUESTIONNAIRE - 04/06/20 1113      Right Upper Extremity Lymphedema   15 cm Proximal to Olecranon Process  29 cm    10 cm Proximal to Olecranon Process  27.4 cm    Olecranon Process  23.7 cm    15 cm Proximal to Ulnar Styloid Process  23.3 cm    10 cm Proximal to Ulnar Styloid Process  19.7 cm    Just Proximal to Ulnar Styloid Process  15.4 cm    Across Hand at [employer]  18 cm    At [place] of 2nd Digit  6 cm    At [facility] of Thumb  6 cm         Pt arrive with herunilateral post mastectomy jovipak breast pad on under camisole bra - looks good fit Her L arm circumference did decrease this date with her wearing her  Bella strongcompression sleeve during day - and tubigrip at night time - with isotoner glove , fluid pills for her legs , and then starting using pump since Friday one time day for hour   -   Report increase tightness  under arm and axilla on Rlimiting her shoulder over head flexion?ABD and external rotationend range-  Reinforce again with pt to do her HEP again AAROM on wall for shoulder flexion ,ABD and in corner or door gentle externalrotation  And in AM - cervical lat flexion stretch Scapula squeezes few times during day   Got measured last week Friday  for custom Jobst Elvarex soft sleeve and glove- did ask for pink ribbon fund assistance  Was fitted with pump last Friday- pt to use it 2 x day am and pm for 45 min to decrease upper arm- forearm did  decrease this date               OT Education - 04/06/20 1134    Education Details  progress and changes to  HEP    Person(s) Educated  Patient    Methods  Explanation;Demonstration;Tactile cues;Verbal cues    Comprehension  Verbal cues required;Returned demonstration;Verbalized understanding          OT Long Term Goals - 03/18/20 1535      OT LONG TERM GOAL #1   Title  Pt to be independent in HEP to do self MLD/pump and wearing of compression garments to maintain her lymphedema under control in L UE and thoracic    Baseline  show increase lymphedema - circumference in R UE since done with chemo and starting radiation this week    Time  8    Period  Weeks    Status  New    Target Date  05/13/20            Plan - 04/06/20 1135    Clinical Impression Statement  Pt busy with radition at the moment -increase stiffness in R shoulder since lat time - pt to cont AAROM for shoulder. Pt did show decrease forearm circumference with use of pump since Friday -and wearing old compression sleeve - she is on fluidpills - she was measured last Friday for [place] [employer] El[employer] sleeve adn glove- pt to hold off on any manual tech with doing radiation    OT Occupational Profile and History  Problem Focused Assessment - Including review of records relating to presenting problem    Occupational performance deficits (Please refer to evaluation for details):  ADL's;IADL's;Play;Leisure    Body Structure / Function / Physical Skills  Edema;Strength;ROM;UE functional use    Rehab Potential  Good    Clinical Decision Making  Limited treatment options, no task modification necessary    Comorbidities Affecting Occupational Performance:  May have comorbidities impacting occupational performance     Modification or Assistance to Complete Evaluation   No modification of tasks or assist necessary to complete eval    OT Frequency  1x / week    OT Duration  6 weeks    OT Treatment/Interventions  Self-care/ADL training;Manual lymph drainage;Patient/family education;Compression bandaging;Manual Therapy    Plan  assses circumfernce, with use of pump- if she got new compression yet- and skin from radiation    Consulted and Agree with Plan of Care  Patient       Patient will benefit from skilled therapeutic intervention in order to improve the following deficits and impairments:   Body Structure / Function / Physical Skills: Edema, Strength, ROM, UE functional use       Visit Diagnosis: Postmastectomy lymphedema syndrome    Problem List Patient Active Problem List   Diagnosis Date Noted  . Ductal carcinoma in situ (DCIS) of left breast 03/04/2020  . Bilateral breast cancer ([place]) 10/09/2019  . Genetic testing 08/27/2019  . Family history of breast cancer   . Family history of ovarian cancer   . Family history of colon cancer   . Family history of leukemia   . Goals of care, counseling/discussion 08/07/2019  . Breast cancer ([place]) 07/30/2019  . GERD (gastroesophageal reflux disease) 06/30/2019  . Abdominal discomfort 05/15/2019  . Advance care planning 07/14/2018  . Health care maintenance 07/14/2018  . Depression, recurrent ([place]) 07/14/2018  . Constipation 07/14/2018  . Altered taste 07/14/2018  . Fibrocystic breast changes of both breasts 01/24/2018  .  Vasomotor symptoms due to menopause 09/05/2017  . Herpes simplex vulvovaginitis 09/05/2017  . Skin nodule 05/25/2017  . Encounter for screening examination for infectious disease 03/18/2017  . Lipoma 03/18/2017  . Vaginitis 03/18/2017  . Radicular pain in left arm 07/27/2015  . Neuralgia and neuritis, unspecified 07/27/2015  . Fatigue 03/05/2015  . B12 deficiency 03/05/2015  . Skin rash 05/01/2014  . MVP (mitral valve  prolapse) 04/30/2014  . Cyst of breast 04/05/2014  . Interstitial cystitis 03/31/2014  . Routine general medical examination at a health care facility 11/13/2011  . Anxiety and depression 10/06/2010  . BREAST CYSTS, BILATERAL 12/03/2008  . PALPITATIONS, CHRONIC 12/03/2008  . UNSPECIFIED VITAMIN D DEFICIENCY 09/10/2008  . HLD (hyperlipidemia) 09/10/2008  . IRRITABLE BOWEL SYNDROME 02/12/2008    Rosalyn Gess OTR/l,CLT 04/06/2020, 11:42 AM  [facility] [address], [place], [address] Phone: [phone]   Fax:  [phone]  Name: Debra Little MRN: [id number] Date of Birth: 1955-12-16

## 2020-04-07 ENCOUNTER — Ambulatory Visit
Admission: RE | Admit: 2020-04-07 | Discharge: 2020-04-07 | Disposition: A | Discharge status: Home / Self Care | Payer: PPO | Source: Ambulatory Visit | Attending: Radiation Oncology | Admitting: Radiation Oncology

## 2020-04-07 DIAGNOSIS — C50311 Malignant neoplasm of lower-inner quadrant of right female breast: Secondary | ICD-10-CM | POA: Diagnosis not present

## 2020-04-07 DIAGNOSIS — Z510 Encounter for antineoplastic radiation therapy: Secondary | ICD-10-CM | POA: Diagnosis not present

## 2020-04-08 ENCOUNTER — Ambulatory Visit
Admission: RE | Admit: 2020-04-08 | Discharge: 2020-04-08 | Disposition: A | Discharge status: Home / Self Care | Payer: PPO | Source: Ambulatory Visit | Attending: Radiation Oncology | Admitting: Radiation Oncology

## 2020-04-08 DIAGNOSIS — Z510 Encounter for antineoplastic radiation therapy: Secondary | ICD-10-CM | POA: Diagnosis not present

## 2020-04-08 DIAGNOSIS — R350 Frequency of micturition: Secondary | ICD-10-CM | POA: Diagnosis not present

## 2020-04-09 ENCOUNTER — Ambulatory Visit (INDEPENDENT_AMBULATORY_CARE_PROVIDER_SITE_OTHER): Payer: PPO

## 2020-04-09 ENCOUNTER — Ambulatory Visit
Admission: RE | Admit: 2020-04-09 | Discharge: 2020-04-09 | Disposition: A | Discharge status: Home / Self Care | Payer: PPO | Source: Ambulatory Visit | Attending: Radiation Oncology | Admitting: Radiation Oncology

## 2020-04-09 VITALS — BP 128/62 | Wt 154.0 lb

## 2020-04-09 DIAGNOSIS — Z0000 Encounter for general adult medical examination without abnormal findings: Secondary | ICD-10-CM

## 2020-04-09 DIAGNOSIS — C50311 Malignant neoplasm of lower-inner quadrant of right female breast: Secondary | ICD-10-CM | POA: Diagnosis not present

## 2020-04-09 DIAGNOSIS — Z510 Encounter for antineoplastic radiation therapy: Secondary | ICD-10-CM | POA: Diagnosis not present

## 2020-04-09 NOTE — Patient Instructions (Signed)
Debra Little , Thank you for taking time to come for your Medicare Wellness Visit. I appreciate your ongoing commitment to your health goals. Please review the following plan we discussed and let me know if I can assist you in the future.   Screening recommendations/referrals: Colonoscopy: Up to date, completed 09/28/2017 Mammogram: Up to date, completed 07/17/2019 Bone Density: Up to date, completed 03/16/2020 Recommended yearly ophthalmology/optometry visit for glaucoma screening and checkup Recommended yearly dental visit for hygiene and checkup  Vaccinations: Influenza vaccine: Up to date, completed 08/02/2019 Pneumococcal vaccine: at age 19 Tdap vaccine: decline Shingles vaccine: discussed    Advanced directives: Advance directive discussed with you today. Even though you declined this today please call our office should you change your mind and we can give you the proper paperwork for you to fill out.  Conditions/risks identified: hyperlipidemia  Next appointment: none  Preventive Care 40-64 Years, Female Preventive care refers to lifestyle choices and visits with your health care provider that can promote health and wellness. What does preventive care include?  A yearly physical exam. This is also called an annual well check.  Dental exams once or twice a year.  Routine eye exams. Ask your health care provider how often you should have your eyes checked.  Personal lifestyle choices, including:  Daily care of your teeth and gums.  Regular physical activity.  Eating a healthy diet.  Avoiding tobacco and drug use.  Limiting alcohol use.  Practicing safe sex.  Taking low-dose aspirin daily starting at age 30.  Taking vitamin and mineral supplements as recommended by your health care provider. What happens during an annual well check? The services and screenings done by your health care provider during your annual well check will depend on your age, overall health,  lifestyle risk factors, and family history of disease. Counseling  Your health care provider may ask you questions about your:  Alcohol use.  Tobacco use.  Drug use.  Emotional well-being.  Home and relationship well-being.  Sexual activity.  Eating habits.  Work and work Statistician.  Method of birth control.  Menstrual cycle.  Pregnancy history. Screening  You may have the following tests or measurements:  Height, weight, and BMI.  Blood pressure.  Lipid and cholesterol levels. These may be checked every 5 years, or more frequently if you are over 31 years old.  Skin check.  Lung cancer screening. You may have this screening every year starting at age 10 if you have a 30-pack-year history of smoking and currently smoke or have quit within the past 15 years.  Fecal occult blood test (FOBT) of the stool. You may have this test every year starting at age 79.  Flexible sigmoidoscopy or colonoscopy. You may have a sigmoidoscopy every 5 years or a colonoscopy every 10 years starting at age 74.  Hepatitis C blood test.  Hepatitis B blood test.  Sexually transmitted disease (STD) testing.  Diabetes screening. This is done by checking your blood sugar (glucose) after you have not eaten for a while (fasting). You may have this done every 1-3 years.  Mammogram. This may be done every 1-2 years. Talk to your health care provider about when you should start having regular mammograms. This may depend on whether you have a family history of breast cancer.  BRCA-related cancer screening. This may be done if you have a family history of breast, ovarian, tubal, or peritoneal cancers.  Pelvic exam and Pap test. This may be done every 3 years  starting at age 20. Starting at age 35, this may be done every 5 years if you have a Pap test in combination with an HPV test.  Bone density scan. This is done to screen for osteoporosis. You may have this scan if you are at high risk for  osteoporosis. Discuss your test results, treatment options, and if necessary, the need for more tests with your health care provider. Vaccines  Your health care provider may recommend certain vaccines, such as:  Influenza vaccine. This is recommended every year.  Tetanus, diphtheria, and acellular pertussis (Tdap, Td) vaccine. You may need a Td booster every 10 years.  Zoster vaccine. You may need this after age 30.  Pneumococcal 13-valent conjugate (PCV13) vaccine. You may need this if you have certain conditions and were not previously vaccinated.  Pneumococcal polysaccharide (PPSV23) vaccine. You may need one or two doses if you smoke cigarettes or if you have certain conditions. Talk to your health care provider about which screenings and vaccines you need and how often you need them. This information is not intended to replace advice given to you by your health care provider. Make sure you discuss any questions you have with your health care provider. Document Released: 12/10/2015 Document Revised: 08/02/2016 Document Reviewed: 09/14/2015 Elsevier Interactive Patient Education  2017 [place] Prevention in the Home Falls can cause injuries. They can happen to people of all ages. There are many things you can do to make your home safe and to help prevent falls. What can I do on the outside of my home?  Regularly fix the edges of walkways and driveways and fix any cracks.  Remove anything that might make you trip as you walk through a door, such as a raised step or threshold.  Trim any bushes or trees on the path to your home.  Use bright outdoor lighting.  Clear any walking paths of anything that might make someone trip, such as rocks or tools.  Regularly check to see if handrails are loose or broken. Make sure that both sides of any steps have handrails.  Any raised decks and porches should have guardrails on the edges.  Have any leaves, snow, or ice cleared  regularly.  Use sand or salt on walking paths during winter.  Clean up any spills in your garage right away. This includes oil or grease spills. What can I do in the bathroom?  Use night lights.  Install grab bars by the toilet and in the tub and shower. Do not use towel bars as grab bars.  Use non-skid mats or decals in the tub or shower.  If you need to sit down in the shower, use a plastic, non-slip stool.  Keep the floor dry. Clean up any water that spills on the floor as soon as it happens.  Remove soap buildup in the tub or shower regularly.  Attach bath mats securely with double-sided non-slip rug tape.  Do not have throw rugs and other things on the floor that can make you trip. What can I do in the bedroom?  Use night lights.  Make sure that you have a light by your bed that is easy to reach.  Do not use any sheets or blankets that are too big for your bed. They should not hang down onto the floor.  Have a firm chair that has side arms. You can use this for support while you get dressed.  Do not have throw rugs and  other things on the floor that can make you trip. What can I do in the kitchen?  Clean up any spills right away.  Avoid walking on wet floors.  Keep items that you use a lot in easy-to-reach places.  If you need to reach something above you, use a strong step stool that has a grab bar.  Keep electrical cords out of the way.  Do not use floor polish or wax that makes floors slippery. If you must use wax, use non-skid floor wax.  Do not have throw rugs and other things on the floor that can make you trip. What can I do with my stairs?  Do not leave any items on the stairs.  Make sure that there are handrails on both sides of the stairs and use them. Fix handrails that are broken or loose. Make sure that handrails are as long as the stairways.  Check any carpeting to make sure that it is firmly attached to the stairs. Fix any carpet that is loose  or worn.  Avoid having throw rugs at the top or bottom of the stairs. If you do have throw rugs, attach them to the floor with carpet tape.  Make sure that you have a light switch at the top of the stairs and the bottom of the stairs. If you do not have them, ask someone to add them for you. What else can I do to help prevent falls?  Wear shoes that:  Do not have high heels.  Have rubber bottoms.  Are comfortable and fit you well.  Are closed at the toe. Do not wear sandals.  If you use a stepladder:  Make sure that it is fully opened. Do not climb a closed stepladder.  Make sure that both sides of the stepladder are locked into place.  Ask someone to hold it for you, if possible.  Clearly mark and make sure that you can see:  Any grab bars or handrails.  First and last steps.  Where the edge of each step is.  Use tools that help you move around (mobility aids) if they are needed. These include:  Canes.  Walkers.  Scooters.  Crutches.  Turn on the lights when you go into a dark area. Replace any light bulbs as soon as they burn out.  Set up your furniture so you have a clear path. Avoid moving your furniture around.  If any of your floors are uneven, fix them.  If there are any pets around you, be aware of where they are.  Review your medicines with your doctor. Some medicines can make you feel dizzy. This can increase your chance of falling. Ask your doctor what other things that you can do to help prevent falls. This information is not intended to replace advice given to you by your health care provider. Make sure you discuss any questions you have with your health care provider. Document Released: 09/09/2009 Document Revised: 04/20/2016 Document Reviewed: 12/18/2014 Elsevier Interactive Patient Education  2017 [employer].

## 2020-04-09 NOTE — Progress Notes (Signed)
PCP notes:  Health Maintenance: Tdap- insurance/financial   Abnormal Screenings: none   Patient concerns: Discuss COVID vaccine Having some shortness of breath   Nurse concerns: none   Next PCP appt.: none

## 2020-04-09 NOTE — Progress Notes (Signed)
Subjective:   Debra Little is a 64 y.o. female who presents for Medicare Annual (Subsequent) preventive examination.  Review of Systems: N/A   I connected with the patient today by telephone and verified that I am speaking with the correct person using two identifiers. Location patient: home Location provider: work Persons participating in the virtual visit: patient, provider.   I discussed the limitations, risks, security and privacy concerns of performing an evaluation and management service by telephone and the availability of in person appointments. I also discussed with the patient that there may be a patient responsible charge related to this service. The patient expressed understanding and verbally consented to this telephonic visit.    Interactive audio and video telecommunications were attempted between this provider and patient, however failed, due to patient having technical difficulties OR patient did not have access to video capability.  We continued and completed visit with audio only.     Time Spent with patient on telephone encounter: 35 minutes  Cardiac Risk Factors include: advanced age (>59men, >32 women);dyslipidemia     Objective:     Vitals: BP 128/62   Wt 154 lb (69.9 kg)   BMI 24.12 kg/m   Body mass index is 24.12 kg/m.  Advanced Directives 04/09/2020 03/03/2020 03/01/2020 02/20/2020 02/16/2020 02/02/2020 01/06/2020  Does Patient Have a Medical Advance Directive? No No No No No No No  Would patient like information on creating a medical advance directive? No - Patient declined No - Patient declined - No - Patient declined No - Patient declined No - Patient declined No - Patient declined    Tobacco Social History   Tobacco Use  Smoking Status Never Smoker  Smokeless Tobacco Never Used     Counseling given: Not Answered   Clinical Intake:  Pre-visit preparation completed: Yes  Pain : 0-10 Pain Score: 5  Pain Type: Chronic pain Pain Location:  Knee Pain Orientation: Left Pain Descriptors / Indicators: Aching Pain Onset: More than a month ago Pain Frequency: Intermittent     Nutritional Risks: None Diabetes: No  How often do you need to have someone help you when you read instructions, pamphlets, or other written materials from your doctor or pharmacy?: 1 - Never What is the last grade level you completed in school?: GED  Interpreter Needed?: No  Information entered by :: CJohnson, LPN  Past Medical History:  Diagnosis Date  . Anemia    d/t chemo - last Hgb 10.8  . Anxiety    sees Dr. [name]  . Breast cancer ([place]) 2020  . Cyst of breast    per [name]  . Dysrhythmia    Hx of palpitations  . Family history of breast cancer   . Family history of colon cancer   . Family history of leukemia   . Family history of ovarian cancer   . Fibromyalgia   . GERD (gastroesophageal reflux disease)   . Heart murmur   . Hemorrhoids   . Herpes, genital 04/2015   confirmed with HSV 2 IgG  . Hiatal hernia   . Hypercholesterolemia    Dr. Kyra Searles  . Hyperlipidemia   . IBS (irritable bowel syndrome)    constipation predominant  . IC (interstitial cystitis)    per [facility]  . Mild depression ([place])   . MVP (mitral valve prolapse)    [facility] cards eval 2012  . Osteopenia    2010/2017, DEXA at [facility];spine and fem neck  . PONV (postoperative nausea and vomiting)   .  Vitamin D deficiency    history of   Past Surgical History:  Procedure Laterality Date  . ABDOMINAL HYSTERECTOMY    . APPENDECTOMY    . BLADDER SURGERY     1980's  . BREAST EXCISIONAL BIOPSY Right    benign  . BREAST EXCISIONAL BIOPSY Bilateral    benign  . COLONOSCOPY  09/2014   Dr. Vira Little  . COLONOSCOPY  03-03-08   at [facility] 1 POLYP (BENIGN)  . excision of breast cysts     hx of multiple cyst aspirations  . EXPLORATORY LAPAROTOMY    . MASTECTOMY MODIFIED RADICAL Bilateral 10/09/2019   Procedure: RIGHT MASTECTOMY MODIFIED RADICAL AND LEFT TOTAL  MASTECTOMY;  Surgeon: Rolm Bookbinder, MD;  Location: [place];  Service: General;  Laterality: Bilateral;  . PORTA CATH INSERTION N/A 08/06/2019   Procedure: PORTA CATH INSERTION;  Surgeon: Katha Cabal, MD;  Location: [place] [facility];  Service: Cardiovascular;  Laterality: N/A;  . PORTACATH PLACEMENT Left 08/05/2019   Procedure: INSERTION PORT-A-CATH, Attempted;  Surgeon: Jules Husbands, MD;  Location: [facility];  Service: General;  Laterality: Left;  . TUBAL LIGATION     Family History  Problem Relation Age of Onset  . Breast cancer Mother 64  . Hypertension Mother   . Colon cancer Father 49  . Hyperlipidemia Father   . Ovarian cancer Maternal Aunt 80  . Breast cancer Cousin   . Gallbladder disease Paternal Grandmother   . Liver cancer Cousin 70       Malignant  . Cancer Maternal Uncle        unsure type   Social History   Socioeconomic History  . Marital status: Divorced    Spouse name: Not on file  . Number of children: 1  . Years of education: Not on file  . Highest education level: Not on file  Occupational History  . Occupation: [employer]  Tobacco Use  . Smoking status: Never Smoker  . Smokeless tobacco: Never Used  Substance and Sexual Activity  . Alcohol use: No    Alcohol/week: 0.0 standard drinks  . Drug use: No  . Sexual activity: Not Currently    Birth control/protection: Surgical    Comment: Hysterectomy  Other Topics Concern  . Not on file  Social History Narrative   Married in 1997/03/03, divorced as of 03-03-2017, 1 son from previous relationship   Brother with MVA at 81, in rest home for many years as of 2018-03-03- died of covid recently   Social Determinants of Radio broadcast assistant Strain: [place]   . Difficulty of Paying Living Expenses: Not hard at all  Food Insecurity: No Food Insecurity  . Worried About Charity fundraiser in the Last Year: Never true  . Ran Out of Food in the Last Year: Never true  Transportation Needs: No Transportation Needs   . Lack of Transportation (Medical): No  . Lack of Transportation (Non-Medical): No  Physical Activity: Inactive  . Days of Exercise per Week: 0 days  . Minutes of Exercise per Session: 0 min  Stress: No Stress Concern Present  . Feeling of Stress : Not at all  Social Connections:   . Frequency of Communication with Friends and Family:   . Frequency of Social Gatherings with Friends and Family:   . Attends Religious Services:   . Active Member of Clubs or Organizations:   . Attends Archivist Meetings:   [name] Marital Status:     Outpatient  Encounter Medications as of 04/09/2020  Medication Sig  . acetaminophen (TYLENOL) 650 MG CR tablet Take 650 mg by mouth every 8 (eight) hours as needed for pain.  . Ascorbic Acid (VITAMIN C) 100 MG tablet Take 100 mg by mouth daily.  [name] atorvastatin (LIPITOR) 40 MG tablet Take 40 mg by mouth at bedtime.   . cholecalciferol (VITAMIN D3) 25 MCG (1000 UT) tablet Take 1,000 Units by mouth daily.   . clonazePAM (KLONOPIN) 0.5 MG tablet Take 0.5 mg by mouth 2 (two) times daily.   [name] dexamethasone (DECADRON) 4 MG tablet Take 2 tablets by mouth daily starting the day after Carboplatin and Cytoxan x 3 days. Take with food.  . DULoxetine (CYMBALTA) 60 MG capsule Take 60 mg by mouth daily.   . furosemide (LASIX) 20 MG tablet Take 1 tablet (20 mg total) by mouth daily.  [name] gabapentin (NEURONTIN) 100 MG capsule Take 1-2 capsules (100-200 mg total) by mouth 3 (three) times daily as needed.  [name] gentamicin ointment (GARAMYCIN) 0.1 % Apply 1 application topically in the morning, at noon, and at bedtime.  [name] HYDROcodone-acetaminophen (NORCO/VICODIN) 5-325 MG tablet Take 1 tablet by mouth every 6 (six) hours as needed for moderate pain.  [name] letrozole (FEMARA) 2.5 MG tablet Take 1 tablet (2.5 mg total) by mouth daily.  [name] lidocaine-prilocaine (EMLA) cream APPLY TO AFFECTED AREAS ONCE AS DIRECTED  . loratadine (CLARITIN) 10 MG tablet Take 1 tablet (10 mg total) by mouth  daily.  . magic mouthwash w/lidocaine SOLN Take 5 mLs by mouth 4 (four) times daily. 80 ml viscous lidocaine 2%, 80 ml Mylanta, 80 ml Diphenhydramine 12.5 mg/5 ml Elixir, 80 ml Nystatin 100,000 Unit suspension, 80 ml Prednisolone 15 mg/72ml, 80 ml Distilled Water. Sig: Swish/Swallow 5-10 ml four times a day as needed. Dispense 480 ml. 3RFs  . methocarbamol (ROBAXIN) 500 MG tablet Take 1 tablet (500 mg total) by mouth every 6 (six) hours as needed for muscle spasms.  . ondansetron (ZOFRAN) 8 MG tablet Take 1 tablet (8 mg total) by mouth 2 (two) times daily as needed. Start on the third day after chemotherapy.  . pantoprazole (PROTONIX) 40 MG tablet TAKE 1 TABLET BY MOUTH ONCE A DAY  . Plecanatide (TRULANCE) 3 MG TABS Take 3 mg by mouth daily.   . prochlorperazine (COMPAZINE) 10 MG tablet Take 1 tablet (10 mg total) by mouth every 6 (six) hours as needed (Nausea or vomiting).  . sennosides-docusate sodium (SENOKOT-S) 8.6-50 MG tablet Take 1-2 tablets by mouth at bedtime.   . sucralfate (CARAFATE) 1 g tablet Take 1 g by mouth 3 (three) times daily.  . valACYclovir (VALTREX) 500 MG tablet TAKE 1 TABLET BY MOUTH DAILY FOR 3 DAYS AS NEEDED AS DIRECTED  . vitamin B-12 (CYANOCOBALAMIN) 500 MCG tablet Take 500 mcg by mouth daily.   No facility-administered encounter medications on file as of 04/09/2020.    Activities of Daily Living In your present state of health, do you have any difficulty performing the following activities: 04/09/2020  Hearing? N  Vision? N  Difficulty concentrating or making decisions? N  Walking or climbing stairs? N  Dressing or bathing? N  Doing errands, shopping? N  Preparing Food and eating ? N  Using the Toilet? N  In the past six months, have you accidently leaked urine? Y  Comment Patient is seeing a specialist. Wears a pad.  Do you have problems with loss of bowel control? N  Managing your Medications? N  Managing  your Finances? N  Housekeeping or managing your  Housekeeping? N  Some recent data might be hidden    Patient Care Team: Tonia Ghent, MD as PCP - General (Family Medicine) Bary Castilla, Forest Gleason, MD (General Surgery) Modesto Charon, MD (Family Medicine) Laneta Simmers as Physician Assistant (Urology) Teodoro Spray, MD as Consulting Physician (Cardiology) Sindy Guadeloupe, MD as Consulting Physician (Hematology and Oncology)    Assessment:   This is a routine wellness examination for Rubbie.  Exercise Activities and Dietary recommendations Current Exercise Habits: The patient does not participate in regular exercise at present, Exercise limited by: None identified  Goals    . Patient Stated     Starting 06/25/2018,  I will continue to take medications as prescribed.     . Patient Stated     04/09/2020, I will maintain and continue medications as prescribed.        Fall Risk Fall Risk  04/09/2020 08/11/2019 07/28/2019 07/16/2019 06/30/2019  Falls in the past year? 0 0 0 0 0  Comment - - - - -  Number falls in past yr: 0 - - - -  Injury with Fall? 0 - - - -  Comment - - - - -  Risk for fall due to : Medication side effect - - - -  Follow up Falls evaluation completed;Falls prevention discussed - - - -   Is the patient's home free of loose throw rugs in walkways, pet beds, electrical cords, etc?   yes      Grab bars in the bathroom? yes      Handrails on the stairs?   yes      Adequate lighting?   yes  Timed Get Up and Go performed: N/A  Depression Screen PHQ 2/9 Scores 04/09/2020 06/25/2018  PHQ - 2 Score 0 0  PHQ- 9 Score 0 0     Cognitive Function MMSE - Mini Mental State Exam 04/09/2020 06/25/2018  Orientation to time 5 5  Orientation to Place 5 5  Registration 3 3  Attention/ Calculation 5 0  Recall 3 3  Language- name 2 objects - 0  Language- repeat 1 1  Language- follow 3 step command - 3  Language- read & follow direction - 0  Write a sentence - 0  Copy design - 0  Total score - 20  Mini  Cog  Mini-Cog screen was completed. Maximum score is 22. A value of 0 denotes this part of the MMSE was not completed or the patient failed this part of the Mini-Cog screening.       Immunization History  Administered Date(s) Administered  . Influenza Whole 09/08/2010  . Influenza, Seasonal, Injecte, Preservative Fre 09/08/2014  . Influenza,inj,Quad PF,6+ Mos 09/23/2013, 08/15/2018, 08/02/2019  . Influenza-Unspecified 09/11/2015    Qualifies for Shingles Vaccine: Yes   Screening Tests Health Maintenance  Topic Date Due  . COVID-19 Vaccine (1) Never done  . TETANUS/TDAP  04/09/2024 (Originally 09/02/2018)  . INFLUENZA VACCINE  06/27/2020  . MAMMOGRAM  07/16/2021  . COLONOSCOPY  09/28/2022  . Hepatitis C Screening  Completed  . HIV Screening  Completed    Cancer Screenings: Lung: Low Dose CT Chest recommended if Age 73-80 years, 30 pack-year currently smoking OR have quit w/in 15 years. Patient does not qualify. Breast:  Up to date on Mammogram: Yes, completed  07/17/2019 Up to date of Bone Density/Dexa: Yes, completed 03/16/2020 Colorectal: completed 09/28/2017  Additional Screenings:  Hepatitis C Screening:  03/16/2017     Plan:   Patient will maintain and continue medications as prescribed.    I have personally reviewed and noted the following in the patient's chart:   . Medical and social history . Use of alcohol, tobacco or illicit drugs  . Current medications and supplements . Functional ability and status . Nutritional status . Physical activity . Advanced directives . List of other physicians . Hospitalizations, surgeries, and ER visits in previous 12 months . Vitals . Screenings to include cognitive, depression, and falls . Referrals and appointments  In addition, I have reviewed and discussed with patient certain preventive protocols, quality metrics, and best practice recommendations. A written personalized care plan for preventive services as well as  general preventive health recommendations were provided to patient.     Andrez Grime, LPN  [id number]

## 2020-04-12 ENCOUNTER — Ambulatory Visit
Admission: RE | Admit: 2020-04-12 | Discharge: 2020-04-12 | Disposition: A | Discharge status: Home / Self Care | Payer: PPO | Source: Ambulatory Visit | Attending: Radiation Oncology | Admitting: Radiation Oncology

## 2020-04-12 DIAGNOSIS — C50311 Malignant neoplasm of lower-inner quadrant of right female breast: Secondary | ICD-10-CM | POA: Diagnosis not present

## 2020-04-12 DIAGNOSIS — Z510 Encounter for antineoplastic radiation therapy: Secondary | ICD-10-CM | POA: Diagnosis not present

## 2020-04-13 ENCOUNTER — Ambulatory Visit
Admission: RE | Admit: 2020-04-13 | Discharge: 2020-04-13 | Disposition: A | Discharge status: Home / Self Care | Payer: PPO | Source: Ambulatory Visit | Attending: Radiation Oncology | Admitting: Radiation Oncology

## 2020-04-13 DIAGNOSIS — C50311 Malignant neoplasm of lower-inner quadrant of right female breast: Secondary | ICD-10-CM | POA: Diagnosis not present

## 2020-04-13 DIAGNOSIS — Z510 Encounter for antineoplastic radiation therapy: Secondary | ICD-10-CM | POA: Diagnosis not present

## 2020-04-14 ENCOUNTER — Ambulatory Visit
Admission: RE | Admit: 2020-04-14 | Discharge: 2020-04-14 | Disposition: A | Discharge status: Home / Self Care | Payer: PPO | Source: Ambulatory Visit | Attending: Radiation Oncology | Admitting: Radiation Oncology

## 2020-04-14 DIAGNOSIS — Z510 Encounter for antineoplastic radiation therapy: Secondary | ICD-10-CM | POA: Diagnosis not present

## 2020-04-14 DIAGNOSIS — C50311 Malignant neoplasm of lower-inner quadrant of right female breast: Secondary | ICD-10-CM | POA: Diagnosis not present

## 2020-04-15 ENCOUNTER — Ambulatory Visit
Admission: RE | Admit: 2020-04-15 | Discharge: 2020-04-15 | Disposition: A | Discharge status: Home / Self Care | Payer: PPO | Source: Ambulatory Visit | Attending: Radiation Oncology | Admitting: Radiation Oncology

## 2020-04-15 ENCOUNTER — Inpatient Hospital Stay: Payer: PPO

## 2020-04-15 ENCOUNTER — Other Ambulatory Visit: Payer: Self-pay

## 2020-04-15 DIAGNOSIS — Z171 Estrogen receptor negative status [ER-]: Secondary | ICD-10-CM

## 2020-04-15 DIAGNOSIS — R350 Frequency of micturition: Secondary | ICD-10-CM | POA: Diagnosis not present

## 2020-04-15 DIAGNOSIS — Z510 Encounter for antineoplastic radiation therapy: Secondary | ICD-10-CM | POA: Diagnosis not present

## 2020-04-15 DIAGNOSIS — C50311 Malignant neoplasm of lower-inner quadrant of right female breast: Secondary | ICD-10-CM | POA: Diagnosis not present

## 2020-04-15 DIAGNOSIS — C50511 Malignant neoplasm of lower-outer quadrant of right female breast: Secondary | ICD-10-CM

## 2020-04-15 LAB — CBC
HCT: 34.8 % — ABNORMAL LOW (ref 36.0–46.0)
Hemoglobin: 11.2 g/dL — ABNORMAL LOW (ref 12.0–15.0)
MCH: 28.6 pg (ref 26.0–34.0)
MCHC: 32.2 g/dL (ref 30.0–36.0)
MCV: 89 fL (ref 80.0–100.0)
Platelets: 180 10*3/uL (ref 150–400)
RBC: 3.91 MIL/uL (ref 3.87–5.11)
RDW: 13.5 % (ref 11.5–15.5)
WBC: 7.5 10*3/uL (ref 4.0–10.5)
nRBC: 0 % (ref 0.0–0.2)

## 2020-04-16 ENCOUNTER — Ambulatory Visit
Admission: RE | Admit: 2020-04-16 | Discharge: 2020-04-16 | Disposition: A | Discharge status: Home / Self Care | Payer: PPO | Source: Ambulatory Visit | Attending: Radiation Oncology | Admitting: Radiation Oncology

## 2020-04-16 DIAGNOSIS — C50311 Malignant neoplasm of lower-inner quadrant of right female breast: Secondary | ICD-10-CM | POA: Diagnosis not present

## 2020-04-16 DIAGNOSIS — Z510 Encounter for antineoplastic radiation therapy: Secondary | ICD-10-CM | POA: Diagnosis not present

## 2020-04-19 ENCOUNTER — Ambulatory Visit
Admission: RE | Admit: 2020-04-19 | Discharge: 2020-04-19 | Disposition: A | Discharge status: Home / Self Care | Payer: PPO | Source: Ambulatory Visit | Attending: Radiation Oncology | Admitting: Radiation Oncology

## 2020-04-19 DIAGNOSIS — E782 Mixed hyperlipidemia: Secondary | ICD-10-CM | POA: Diagnosis not present

## 2020-04-19 DIAGNOSIS — C50311 Malignant neoplasm of lower-inner quadrant of right female breast: Secondary | ICD-10-CM | POA: Diagnosis not present

## 2020-04-19 DIAGNOSIS — I341 Nonrheumatic mitral (valve) prolapse: Secondary | ICD-10-CM | POA: Diagnosis not present

## 2020-04-19 DIAGNOSIS — Z510 Encounter for antineoplastic radiation therapy: Secondary | ICD-10-CM | POA: Diagnosis not present

## 2020-04-20 ENCOUNTER — Ambulatory Visit
Admission: RE | Admit: 2020-04-20 | Discharge: 2020-04-20 | Disposition: A | Discharge status: Home / Self Care | Payer: PPO | Source: Ambulatory Visit | Attending: Radiation Oncology | Admitting: Radiation Oncology

## 2020-04-20 ENCOUNTER — Other Ambulatory Visit: Payer: Self-pay

## 2020-04-20 ENCOUNTER — Ambulatory Visit (INDEPENDENT_AMBULATORY_CARE_PROVIDER_SITE_OTHER): Payer: PPO | Admitting: Family Medicine

## 2020-04-20 ENCOUNTER — Encounter: Payer: Self-pay | Admitting: Family Medicine

## 2020-04-20 VITALS — BP 112/58 | HR 73 | Temp 97.1°F | Ht 67.0 in | Wt 157.6 lb

## 2020-04-20 DIAGNOSIS — R5383 Other fatigue: Secondary | ICD-10-CM | POA: Diagnosis not present

## 2020-04-20 DIAGNOSIS — Z171 Estrogen receptor negative status [ER-]: Secondary | ICD-10-CM

## 2020-04-20 DIAGNOSIS — C50311 Malignant neoplasm of lower-inner quadrant of right female breast: Secondary | ICD-10-CM | POA: Diagnosis not present

## 2020-04-20 DIAGNOSIS — E538 Deficiency of other specified B group vitamins: Secondary | ICD-10-CM

## 2020-04-20 DIAGNOSIS — Z510 Encounter for antineoplastic radiation therapy: Secondary | ICD-10-CM | POA: Diagnosis not present

## 2020-04-20 DIAGNOSIS — E559 Vitamin D deficiency, unspecified: Secondary | ICD-10-CM

## 2020-04-20 DIAGNOSIS — C50511 Malignant neoplasm of lower-outer quadrant of right female breast: Secondary | ICD-10-CM

## 2020-04-20 DIAGNOSIS — E785 Hyperlipidemia, unspecified: Secondary | ICD-10-CM | POA: Diagnosis not present

## 2020-04-20 LAB — VITAMIN D 25 HYDROXY (VIT D DEFICIENCY, FRACTURES): VITD: 100.71 ng/mL — ABNORMAL HIGH (ref 30.00–100.00)

## 2020-04-20 LAB — LIPID PANEL
Cholesterol: 145 mg/dL (ref 0–200)
HDL: 53.9 mg/dL (ref >39.00)
LDL Cholesterol: 63 mg/dL (ref 0–99)
NonHDL: 90.72
Total CHOL/HDL Ratio: 3
Triglycerides: 140 mg/dL (ref 0.0–149.0)
VLDL: 28 mg/dL (ref 0.0–40.0)

## 2020-04-20 LAB — TSH: TSH: 1.18 u[IU]/mL (ref 0.35–4.50)

## 2020-04-20 LAB — VITAMIN B12: Vitamin B-12: 976 pg/mL — ABNORMAL HIGH (ref 211–911)

## 2020-04-20 MED ORDER — VITAMIN D 25 MCG (1000 UNIT) PO TABS: 5000.0000 [IU] | ORAL_TABLET | Freq: Every day | ORAL | Status: DC

## 2020-04-20 MED ORDER — FUROSEMIDE 20 MG PO TABS: 20.0000 mg | ORAL_TABLET | ORAL | Status: AC

## 2020-04-20 NOTE — Patient Instructions (Signed)
Go to the lab on the way out.   If you have mychart we'll likely use that to update you.    Take care.  Glad to see you. 

## 2020-04-20 NOTE — Progress Notes (Signed)
This visit occurred during the SARS-CoV-2 public health emergency.  Safety protocols were in place, including screening questions prior to the visit, additional usage of staff PPE, and extensive cleaning of exam room while observing appropriate contact time as indicated for disinfecting solutions.  "Swimmy headed feeling."  occ noted when getting up in the AM.  No sx currently.  Not orthostatic on standing.  She is going to see the eye clinic when possible.  D/w pt.    History of B12 and vitamin D deficiency.  See notes on follow-up labs.  On replacement of both.  Her brother died, d/w pt.  Condolences offered.    covid vaccination d/w pt.   encouraged vaccination.  Saw cardiology yesterday with f/u echo pending.  She has SOB with exertion and cardiology is aware, symptoms are better with some rest.  This is stable, not acute, noted since starting chemo.    Cancer dx and tx discussed with patient.  She is still going through rady tx.  Prev swelling is better in the meantime, she is using a pump on the R arm.  She is tired from radiation tx "but I keep going."  She has local irritation, better with hydrocortisone.  Her had is growing back, white.  Her appetite is still good.    Elevated Cholesterol: Using medications without problems: likely not worse with statin use Muscle aches: see above Diet compliance: yes, good.   Exercise: limited by fatigue and joint pain.   Taking tylenol for sciatica pain, with some relief.    She is going to see Dr. Berenice Primas about her L knee pain.  She is some better in the meantime.    She is safe at home.  Discussed.  Meds, vitals, and allergies reviewed.   ROS: Per HPI unless specifically indicated in ROS section   GEN: nad, alert and oriented HEENT: ncat NECK: supple w/o LA CV: rrr.   PULM: ctab, no inc wob ABD: soft, +bs EXT: no edema SKIN: no acute rash, but with chronic changes on the right upper chest wall.

## 2020-04-21 ENCOUNTER — Other Ambulatory Visit: Payer: Self-pay | Admitting: Family Medicine

## 2020-04-21 ENCOUNTER — Ambulatory Visit
Admission: RE | Admit: 2020-04-21 | Discharge: 2020-04-21 | Disposition: A | Discharge status: Home / Self Care | Payer: PPO | Source: Ambulatory Visit | Attending: Radiation Oncology | Admitting: Radiation Oncology

## 2020-04-21 DIAGNOSIS — C50311 Malignant neoplasm of lower-inner quadrant of right female breast: Secondary | ICD-10-CM | POA: Diagnosis not present

## 2020-04-21 DIAGNOSIS — Z510 Encounter for antineoplastic radiation therapy: Secondary | ICD-10-CM | POA: Diagnosis not present

## 2020-04-21 MED ORDER — VITAMIN D 25 MCG (1000 UNIT) PO TABS: 1000.0000 [IU] | ORAL_TABLET | Freq: Every day | ORAL | Status: AC

## 2020-04-21 NOTE — Assessment & Plan Note (Signed)
Cancer dx and tx discussed with patient.  She is still going through rady tx.  Prev swelling is better in the meantime, she is using a pump on the R arm.  She is tired from radiation tx "but I keep going."  She has local irritation, better with hydrocortisone.  Her had is growing back, white.  Her appetite is still good.   I will await follow-up notes.  She agrees.

## 2020-04-21 NOTE — Assessment & Plan Note (Signed)
See notes on follow-up labs.  Her aches are not worse with statin use.  As a separate issue she is going to follow-up with Dr. Berenice Primas about her left knee.  She also has cardiology follow-up pending, specifically echo pending.  I will await that report.  At least 30 minutes were devoted to patient care in this encounter (this can potentially include time spent reviewing the patient's file/history, interviewing and examining the patient, counseling/reviewing plan with patient, ordering referrals, ordering tests, reviewing relevant laboratory or x-ray data, and documenting the encounter).

## 2020-04-21 NOTE — Assessment & Plan Note (Signed)
Continue replacement for now.  See notes on follow-up labs.

## 2020-04-21 NOTE — Assessment & Plan Note (Signed)
Likely multifactorial.  Check TSH.

## 2020-04-21 NOTE — Assessment & Plan Note (Signed)
See notes on follow-up labs.  Continue replacement for now.

## 2020-04-22 ENCOUNTER — Ambulatory Visit
Admission: RE | Admit: 2020-04-22 | Discharge: 2020-04-22 | Disposition: A | Discharge status: Home / Self Care | Payer: PPO | Source: Ambulatory Visit | Attending: Radiation Oncology | Admitting: Radiation Oncology

## 2020-04-22 DIAGNOSIS — R350 Frequency of micturition: Secondary | ICD-10-CM | POA: Diagnosis not present

## 2020-04-22 DIAGNOSIS — F330 Major depressive disorder, recurrent, mild: Secondary | ICD-10-CM | POA: Diagnosis not present

## 2020-04-22 DIAGNOSIS — C50311 Malignant neoplasm of lower-inner quadrant of right female breast: Secondary | ICD-10-CM | POA: Diagnosis not present

## 2020-04-22 DIAGNOSIS — F410 Panic disorder [episodic paroxysmal anxiety] without agoraphobia: Secondary | ICD-10-CM | POA: Diagnosis not present

## 2020-04-22 DIAGNOSIS — F411 Generalized anxiety disorder: Secondary | ICD-10-CM | POA: Diagnosis not present

## 2020-04-22 DIAGNOSIS — Z510 Encounter for antineoplastic radiation therapy: Secondary | ICD-10-CM | POA: Diagnosis not present

## 2020-04-23 ENCOUNTER — Ambulatory Visit
Admission: RE | Admit: 2020-04-23 | Discharge: 2020-04-23 | Disposition: A | Discharge status: Home / Self Care | Payer: PPO | Source: Ambulatory Visit | Attending: Radiation Oncology | Admitting: Radiation Oncology

## 2020-04-23 DIAGNOSIS — C50311 Malignant neoplasm of lower-inner quadrant of right female breast: Secondary | ICD-10-CM | POA: Diagnosis not present

## 2020-04-23 DIAGNOSIS — Z510 Encounter for antineoplastic radiation therapy: Secondary | ICD-10-CM | POA: Diagnosis not present

## 2020-04-27 ENCOUNTER — Ambulatory Visit
Admission: RE | Admit: 2020-04-27 | Discharge: 2020-04-27 | Disposition: A | Discharge status: Home / Self Care | Payer: PPO | Source: Ambulatory Visit | Attending: Radiation Oncology | Admitting: Radiation Oncology

## 2020-04-27 DIAGNOSIS — C50511 Malignant neoplasm of lower-outer quadrant of right female breast: Secondary | ICD-10-CM | POA: Diagnosis not present

## 2020-04-27 DIAGNOSIS — Z510 Encounter for antineoplastic radiation therapy: Secondary | ICD-10-CM | POA: Insufficient documentation

## 2020-04-28 ENCOUNTER — Ambulatory Visit
Admission: RE | Admit: 2020-04-28 | Discharge: 2020-04-28 | Disposition: A | Discharge status: Home / Self Care | Payer: PPO | Source: Ambulatory Visit | Attending: Radiation Oncology | Admitting: Radiation Oncology

## 2020-04-28 DIAGNOSIS — C50311 Malignant neoplasm of lower-inner quadrant of right female breast: Secondary | ICD-10-CM | POA: Diagnosis not present

## 2020-04-28 DIAGNOSIS — Z510 Encounter for antineoplastic radiation therapy: Secondary | ICD-10-CM | POA: Diagnosis not present

## 2020-04-29 ENCOUNTER — Other Ambulatory Visit: Payer: Self-pay

## 2020-04-29 ENCOUNTER — Ambulatory Visit
Admission: RE | Admit: 2020-04-29 | Discharge: 2020-04-29 | Disposition: A | Discharge status: Home / Self Care | Payer: PPO | Source: Ambulatory Visit | Attending: Radiation Oncology | Admitting: Radiation Oncology

## 2020-04-29 ENCOUNTER — Inpatient Hospital Stay: Payer: PPO | Attending: Oncology

## 2020-04-29 DIAGNOSIS — Z9221 Personal history of antineoplastic chemotherapy: Secondary | ICD-10-CM | POA: Insufficient documentation

## 2020-04-29 DIAGNOSIS — C50511 Malignant neoplasm of lower-outer quadrant of right female breast: Secondary | ICD-10-CM | POA: Diagnosis not present

## 2020-04-29 DIAGNOSIS — Z510 Encounter for antineoplastic radiation therapy: Secondary | ICD-10-CM | POA: Diagnosis not present

## 2020-04-29 DIAGNOSIS — Z452 Encounter for adjustment and management of vascular access device: Secondary | ICD-10-CM | POA: Diagnosis not present

## 2020-04-29 DIAGNOSIS — Z171 Estrogen receptor negative status [ER-]: Secondary | ICD-10-CM | POA: Diagnosis not present

## 2020-04-29 DIAGNOSIS — R350 Frequency of micturition: Secondary | ICD-10-CM | POA: Diagnosis not present

## 2020-04-29 DIAGNOSIS — D0512 Intraductal carcinoma in situ of left breast: Secondary | ICD-10-CM | POA: Insufficient documentation

## 2020-04-29 LAB — COMPREHENSIVE METABOLIC PANEL
ALT: 15 U/L (ref 0–44)
AST: 19 U/L (ref 15–41)
Albumin: 3.6 g/dL (ref 3.5–5.0)
Alkaline Phosphatase: 54 U/L (ref 38–126)
Anion gap: 8 (ref 5–15)
BUN: 15 mg/dL (ref 8–23)
CO2: 30 mmol/L (ref 22–32)
Calcium: 8.9 mg/dL (ref 8.9–10.3)
Chloride: 104 mmol/L (ref 98–111)
Creatinine, Ser: 0.89 mg/dL (ref 0.44–1.00)
GFR calc Af Amer: >60 mL/min (ref >60)
GFR calc non Af Amer: >60 mL/min (ref >60)
Glucose, Bld: 100 mg/dL — ABNORMAL HIGH (ref 70–99)
Potassium: 3.9 mmol/L (ref 3.5–5.1)
Sodium: 142 mmol/L (ref 135–145)
Total Bilirubin: 0.5 mg/dL (ref 0.3–1.2)
Total Protein: 6.7 g/dL (ref 6.5–8.1)

## 2020-04-29 LAB — CBC WITH DIFFERENTIAL/PLATELET
Abs Immature Granulocytes: 0.02 10*3/uL (ref 0.00–0.07)
Basophils Absolute: 0 10*3/uL (ref 0.0–0.1)
Basophils Relative: 0 %
Eosinophils Absolute: 0.2 10*3/uL (ref 0.0–0.5)
Eosinophils Relative: 3 %
HCT: 35.1 % — ABNORMAL LOW (ref 36.0–46.0)
Hemoglobin: 11.3 g/dL — ABNORMAL LOW (ref 12.0–15.0)
Immature Granulocytes: 0 %
Lymphocytes Relative: 10 %
Lymphs Abs: 0.6 10*3/uL — ABNORMAL LOW (ref 0.7–4.0)
MCH: 28.3 pg (ref 26.0–34.0)
MCHC: 32.2 g/dL (ref 30.0–36.0)
MCV: 88 fL (ref 80.0–100.0)
Monocytes Absolute: 0.7 10*3/uL (ref 0.1–1.0)
Monocytes Relative: 11 %
Neutro Abs: 4.9 10*3/uL (ref 1.7–7.7)
Neutrophils Relative %: 76 %
Platelets: 190 10*3/uL (ref 150–400)
RBC: 3.99 MIL/uL (ref 3.87–5.11)
RDW: 13.1 % (ref 11.5–15.5)
WBC: 6.4 10*3/uL (ref 4.0–10.5)
nRBC: 0 % (ref 0.0–0.2)

## 2020-04-30 ENCOUNTER — Ambulatory Visit
Admission: RE | Admit: 2020-04-30 | Discharge: 2020-04-30 | Disposition: A | Discharge status: Home / Self Care | Payer: PPO | Source: Ambulatory Visit | Attending: Radiation Oncology | Admitting: Radiation Oncology

## 2020-04-30 DIAGNOSIS — Z510 Encounter for antineoplastic radiation therapy: Secondary | ICD-10-CM | POA: Diagnosis not present

## 2020-05-03 ENCOUNTER — Ambulatory Visit
Admission: RE | Admit: 2020-05-03 | Discharge: 2020-05-03 | Disposition: A | Discharge status: Home / Self Care | Payer: PPO | Source: Ambulatory Visit | Attending: Radiation Oncology | Admitting: Radiation Oncology

## 2020-05-03 DIAGNOSIS — Z510 Encounter for antineoplastic radiation therapy: Secondary | ICD-10-CM | POA: Diagnosis not present

## 2020-05-04 ENCOUNTER — Ambulatory Visit
Admission: RE | Admit: 2020-05-04 | Discharge: 2020-05-04 | Disposition: A | Discharge status: Home / Self Care | Payer: PPO | Source: Ambulatory Visit | Attending: Radiation Oncology | Admitting: Radiation Oncology

## 2020-05-04 DIAGNOSIS — C50311 Malignant neoplasm of lower-inner quadrant of right female breast: Secondary | ICD-10-CM | POA: Diagnosis not present

## 2020-05-04 DIAGNOSIS — Z510 Encounter for antineoplastic radiation therapy: Secondary | ICD-10-CM | POA: Diagnosis not present

## 2020-05-05 ENCOUNTER — Ambulatory Visit
Admission: RE | Admit: 2020-05-05 | Discharge: 2020-05-05 | Disposition: A | Discharge status: Home / Self Care | Payer: PPO | Source: Ambulatory Visit | Attending: Radiation Oncology | Admitting: Radiation Oncology

## 2020-05-05 DIAGNOSIS — C50311 Malignant neoplasm of lower-inner quadrant of right female breast: Secondary | ICD-10-CM | POA: Diagnosis not present

## 2020-05-05 DIAGNOSIS — Z510 Encounter for antineoplastic radiation therapy: Secondary | ICD-10-CM | POA: Diagnosis not present

## 2020-05-06 ENCOUNTER — Ambulatory Visit
Admission: RE | Admit: 2020-05-06 | Discharge: 2020-05-06 | Disposition: A | Discharge status: Home / Self Care | Payer: PPO | Source: Ambulatory Visit | Attending: Radiation Oncology | Admitting: Radiation Oncology

## 2020-05-06 DIAGNOSIS — Z510 Encounter for antineoplastic radiation therapy: Secondary | ICD-10-CM | POA: Diagnosis not present

## 2020-05-06 DIAGNOSIS — R350 Frequency of micturition: Secondary | ICD-10-CM | POA: Diagnosis not present

## 2020-05-06 DIAGNOSIS — N3946 Mixed incontinence: Secondary | ICD-10-CM | POA: Diagnosis not present

## 2020-05-17 ENCOUNTER — Other Ambulatory Visit: Payer: Self-pay

## 2020-05-17 ENCOUNTER — Encounter: Payer: Self-pay | Admitting: Oncology

## 2020-05-17 ENCOUNTER — Inpatient Hospital Stay: Payer: PPO | Admitting: Oncology

## 2020-05-17 ENCOUNTER — Other Ambulatory Visit: Payer: Self-pay | Admitting: *Deleted

## 2020-05-17 VITALS — BP 130/62 | HR 81 | Temp 96.4°F | Resp 18

## 2020-05-17 DIAGNOSIS — Z853 Personal history of malignant neoplasm of breast: Secondary | ICD-10-CM

## 2020-05-17 DIAGNOSIS — Z171 Estrogen receptor negative status [ER-]: Secondary | ICD-10-CM

## 2020-05-17 DIAGNOSIS — C50511 Malignant neoplasm of lower-outer quadrant of right female breast: Secondary | ICD-10-CM

## 2020-05-17 DIAGNOSIS — G4485 Primary stabbing headache: Secondary | ICD-10-CM | POA: Diagnosis not present

## 2020-05-18 DIAGNOSIS — D2272 Melanocytic nevi of left lower limb, including hip: Secondary | ICD-10-CM | POA: Diagnosis not present

## 2020-05-18 DIAGNOSIS — D2261 Melanocytic nevi of right upper limb, including shoulder: Secondary | ICD-10-CM | POA: Diagnosis not present

## 2020-05-18 DIAGNOSIS — R208 Other disturbances of skin sensation: Secondary | ICD-10-CM | POA: Diagnosis not present

## 2020-05-18 DIAGNOSIS — L820 Inflamed seborrheic keratosis: Secondary | ICD-10-CM | POA: Diagnosis not present

## 2020-05-18 DIAGNOSIS — L538 Other specified erythematous conditions: Secondary | ICD-10-CM | POA: Diagnosis not present

## 2020-05-18 DIAGNOSIS — D2262 Melanocytic nevi of left upper limb, including shoulder: Secondary | ICD-10-CM | POA: Diagnosis not present

## 2020-05-18 DIAGNOSIS — L821 Other seborrheic keratosis: Secondary | ICD-10-CM | POA: Diagnosis not present

## 2020-05-18 DIAGNOSIS — D2271 Melanocytic nevi of right lower limb, including hip: Secondary | ICD-10-CM | POA: Diagnosis not present

## 2020-05-18 DIAGNOSIS — D225 Melanocytic nevi of trunk: Secondary | ICD-10-CM | POA: Diagnosis not present

## 2020-05-20 NOTE — Progress Notes (Signed)
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 in the past and has undergone breast biopsies which did not previously showed malignancy. More recently patient noted some red discoloration around her right perioral as well as a palpable mass which led to a diagnostic mammogram on the right side her prior mammogram in February 2020 was normal in August 2020 she was noted to have a 2.7 x 2 x 2.3 cm mass in her right breast along with 4 morphologically abnormal lymph nodes. She underwent breast biopsy as well as lymph node biopsy which showed grade 3 invasive mammary carcinoma. ER PR and HER-2/neunegative.Sections show high-grade invasive carcinoma with focal squamous differentiation and pinpoint keratin ideation. The features are concerning for metaplastic differentiation.[name] Lymphovascular invasion present. There was extranodal extension present on the lymph node biopsy.   Her family history is significant for breast cancer in her mother in her 44s. Father had colon cancer in his70s.Family history also significant for ovarian cancer in her maternal aunt.  MRI of bilateral breasts showed: Primary right breast mass was 3.5 x 2.7 x 3.4 cm.  There were surrounding numerous nodules consistent with satellite lesions. Extensive nodular non-mass enhancement throughout the right breast involving the upper and lower quadrants spanning 6.2 x 4.5 x 5.5 cm. Markedly enhancement of the left breast diffusely. Non-mass enhancement measures 1.9 x 2.4 cm. At least 3 abnormal lymph nodes in the right axilla. No abnormal left axillary lymph nodes. 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 Equivocal inferior breast and left axillary nodal hypermetabolism. No evidence of extrathoracic hypermetabolic metastases. Lateral right breast primary with right axillary nodal metastases  Patient had 3 cycles of neoadjuvant AC chemotherapy along with Keytruda. There was no significant response in her tumor and patient proceeded with bilateral mastectomy with targeted node dissection. 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 7 lymph nodes were positive for malignancy with extranodal extension overall cancer cellularity was 40% [id number].Patient completed 1 cycle of adjuvant AC chemotherapy and willcomplete weekly Taxol chemotherapy in April 2021. She completed post mastectomy radiation  Interval history- reports ongoing fatigue. Reports having on and off headaches and occasional issues with balance. No falls. She has not started taking letrozole yet  ECOG PS- 1 Pain scale- 7   Review of systems- Review of Systems  Constitutional: Negative for chills, fever, malaise/fatigue and weight loss.  HENT: Negative for congestion, ear discharge and nosebleeds.   Eyes: Negative for blurred vision.  Respiratory: Negative for cough, hemoptysis, sputum production, shortness of breath and wheezing.   Cardiovascular: Negative for chest pain, palpitations, orthopnea and claudication.  Gastrointestinal: Negative for  abdominal pain, blood in stool, constipation, diarrhea, heartburn, melena, nausea and vomiting.  Genitourinary: Negative  for dysuria, flank pain, frequency, hematuria and urgency.  Musculoskeletal: Negative for back pain, joint pain and myalgias.  Skin: Negative for rash.  Neurological: Positive for headaches. Negative for dizziness, tingling, focal weakness, seizures and weakness.  Endo/Heme/Allergies: Does not bruise/bleed easily.  Psychiatric/Behavioral: Negative for depression and suicidal ideas. The patient does not have insomnia.       Allergies  Allergen Reactions  . Amitiza [Lubiprostone] Other (See Comments)    Overly effective at higher dose  . Bentyl [Dicyclomine Hcl]     Lack of effect  . Celecoxib     REACTION: unspecified  . Cortisone Other (See Comments)    flush  . Mobic [Meloxicam]     Palpitations.  But can tolerate aleve  . Nitrofurantoin Monohyd Macro   . Sulfonamide Derivatives Nausea Only  . Zoloft [Sertraline Hcl] Diarrhea and Nausea Only  . Buspar [Buspirone] Palpitations  . Doxycycline Palpitations    chest pain  . Penicillins Rash     Past Medical History:  Diagnosis Date  . Anemia    d/t chemo - last Hgb 10.8  . Anxiety    sees Dr. [name]  . Breast cancer ([place]) 2020  . Cyst of breast    per [name]  . Dysrhythmia    Hx of palpitations  . Family history of breast cancer   . Family history of colon cancer   . Family history of leukemia   . Family history of ovarian cancer   . Fibromyalgia   . GERD (gastroesophageal reflux disease)   . Heart murmur   . Hemorrhoids   . Herpes, genital 04/2015   confirmed with HSV 2 IgG  . Hiatal hernia   . Hypercholesterolemia    Dr. Kyra Searles  . Hyperlipidemia   . IBS (irritable bowel syndrome)    constipation predominant  . IC (interstitial cystitis)    per [facility]  . Mild depression ([place])   . MVP (mitral valve prolapse)    [facility] cards eval 2012  . Osteopenia    2010/2017, DEXA at  [facility];spine and fem neck  . PONV (postoperative nausea and vomiting)   . Vitamin D deficiency    history of     Past Surgical History:  Procedure Laterality Date  . ABDOMINAL HYSTERECTOMY    . APPENDECTOMY    . BLADDER SURGERY     1980's  . BREAST EXCISIONAL BIOPSY Right    benign  . BREAST EXCISIONAL BIOPSY Bilateral    benign  . COLONOSCOPY  09/2014   Dr. [name]  . COLONOSCOPY  2009   at [facility] 1 POLYP (BENIGN)  . excision of breast cysts     hx of multiple cyst aspirations  . EXPLORATORY LAPAROTOMY    . MASTECTOMY MODIFIED RADICAL Bilateral 10/09/2019   Procedure: RIGHT MASTECTOMY MODIFIED RADICAL AND LEFT TOTAL MASTECTOMY;  Surgeon: Rolm Bookbinder, MD;  Location: [facility];  Service: General;  Laterality: Bilateral;  . PORTA CATH INSERTION N/A 08/06/2019   Procedure: PORTA CATH INSERTION;  Surgeon: Katha Cabal, MD;  Location: [facility];  Service: Cardiovascular;  Laterality: N/A;  . PORTACATH PLACEMENT Left 08/05/2019   Procedure: INSERTION PORT-A-CATH, Attempted;  Surgeon: Jules Husbands, MD;  Location: [facility];  Service: General;  Laterality: Left;  . TUBAL LIGATION      Social History   Socioeconomic History  . Marital status: Divorced    Spouse name: Not on file  . Number of children: 1  . Years of education:  Not on file  . Highest education level: Not on file  Occupational History  . Occupation: [employer]  Tobacco Use  . Smoking status: Never Smoker  . Smokeless tobacco: Never Used  Vaping Use  . Vaping Use: Never used  Substance and Sexual Activity  . Alcohol use: No    Alcohol/week: 0.0 standard drinks  . Drug use: No  . Sexual activity: Not Currently    Birth control/protection: Surgical    Comment: Hysterectomy  Other Topics Concern  . Not on file  Social History Narrative   Married in 01/24/97, divorced as of Jan 24, 2017, 1 son from previous relationship   Brother with MVA at 36, in rest home for many years as of 24-Jan-2018- died of covid recently    Social Determinants of Radio broadcast assistant Strain: [place]   . Difficulty of Paying Living Expenses: Not hard at all  Food Insecurity: No Food Insecurity  . Worried About Charity fundraiser in the Last Year: Never true  . Ran Out of Food in the Last Year: Never true  Transportation Needs: No Transportation Needs  . Lack of Transportation (Medical): No  . Lack of Transportation (Non-Medical): No  Physical Activity: Inactive  . Days of Exercise per Week: 0 days  . Minutes of Exercise per Session: 0 min  Stress: No Stress Concern Present  . Feeling of Stress : Not at all  Social Connections:   . Frequency of Communication with Friends and Family:   . Frequency of Social Gatherings with Friends and Family:   . Attends Religious Services:   . Active Member of Clubs or Organizations:   . Attends Archivist Meetings:   [name] Marital Status:   Intimate Partner Violence: Not At Risk  . Fear of Current or Ex-Partner: No  . Emotionally Abused: No  . Physically Abused: No  . Sexually Abused: No    Family History  Problem Relation Age of Onset  . Breast cancer Mother 50  . Hypertension Mother   . Colon cancer Father 68  . Hyperlipidemia Father   . Ovarian cancer Maternal Aunt 80  . Breast cancer Cousin   . Gallbladder disease Paternal Grandmother   . Liver cancer Cousin 70       Malignant  . Cancer Maternal Uncle        unsure type     Current Outpatient Medications:  .  acetaminophen (TYLENOL) 650 MG CR tablet, Take 650 mg by mouth every 8 (eight) hours as needed for pain., Disp: , Rfl:  .  Ascorbic Acid (VITAMIN C) 100 MG tablet, Take 100 mg by mouth daily., Disp: , Rfl:  .  atorvastatin (LIPITOR) 40 MG tablet, Take 40 mg by mouth at bedtime. , Disp: , Rfl: 1 .  Calcium Carbonate-Vit D-Min (CALCIUM 1200 PO), Take 4 tablets by mouth daily., Disp: , Rfl:  .  cholecalciferol (VITAMIN D3) 25 MCG (1000 UNIT) tablet, Take 1 tablet (1,000 Units total) by mouth  daily., Disp: , Rfl:  .  clonazePAM (KLONOPIN) 0.5 MG tablet, Take 0.5 mg by mouth 2 (two) times daily. , Disp: , Rfl:  .  DULoxetine (CYMBALTA) 60 MG capsule, Take 60 mg by mouth daily. , Disp: , Rfl:  .  gabapentin (NEURONTIN) 100 MG capsule, Take 1-2 capsules (100-200 mg total) by mouth 3 (three) times daily as needed., Disp: 90 capsule, Rfl: 1 .  lidocaine-prilocaine (EMLA) cream, APPLY TO AFFECTED AREAS ONCE AS DIRECTED, Disp: 30 g, Rfl:  3 .  loratadine (CLARITIN) 10 MG tablet, Take 1 tablet (10 mg total) by mouth daily., Disp: , Rfl:  .  methocarbamol (ROBAXIN) 500 MG tablet, Take 1 tablet (500 mg total) by mouth every 6 (six) hours as needed for muscle spasms., Disp: 30 tablet, Rfl: 1 .  pantoprazole (PROTONIX) 40 MG tablet, TAKE 1 TABLET BY MOUTH ONCE A DAY, Disp: 30 tablet, Rfl: 1 .  Plecanatide (TRULANCE) 3 MG TABS, Take 3 mg by mouth daily. , Disp: , Rfl:  .  sucralfate (CARAFATE) 1 g tablet, Take 1 g by mouth 3 (three) times daily., Disp: , Rfl:  .  valACYclovir (VALTREX) 500 MG tablet, TAKE 1 TABLET BY MOUTH DAILY FOR 3 DAYS AS NEEDED AS DIRECTED, Disp: 90 tablet, Rfl: 0 .  vitamin B-12 (CYANOCOBALAMIN) 500 MCG tablet, Take 500 mcg by mouth daily., Disp: , Rfl:  .  furosemide (LASIX) 20 MG tablet, Take 1 tablet (20 mg total) by mouth every other day. (Patient not taking: Reported on 05/17/2020), Disp: , Rfl:  .  gentamicin ointment (GARAMYCIN) 0.1 %, Apply 1 application topically in the morning, at noon, and at bedtime. (Patient not taking: Reported on 05/17/2020), Disp: , Rfl:  .  HYDROcodone-acetaminophen (NORCO/VICODIN) 5-325 MG tablet, Take 1 tablet by mouth every 6 (six) hours as needed for moderate pain. (Patient not taking: Reported on 04/20/2020), Disp: 15 tablet, Rfl: 0 .  letrozole (FEMARA) 2.5 MG tablet, Take 1 tablet (2.5 mg total) by mouth daily. (Patient not taking: Reported on 05/17/2020), Disp: 30 tablet, Rfl: 3 .  magic mouthwash w/lidocaine SOLN, Take 5 mLs by mouth 4  (four) times daily. 80 ml viscous lidocaine 2%, 80 ml Mylanta, 80 ml Diphenhydramine 12.5 mg/5 ml Elixir, 80 ml Nystatin 100,000 Unit suspension, 80 ml Prednisolone 15 mg/41m, 80 ml Distilled Water. Sig: Swish/Swallow 5-10 ml four times a day as needed. Dispense 480 ml. 3RFs (Patient not taking: Reported on 05/17/2020), Disp: 480 mL, Rfl: 3 .  ondansetron (ZOFRAN) 8 MG tablet, Take 1 tablet (8 mg total) by mouth 2 (two) times daily as needed. Start on the third day after chemotherapy. (Patient not taking: Reported on 05/17/2020), Disp: 30 tablet, Rfl: 1 .  prochlorperazine (COMPAZINE) 10 MG tablet, Take 1 tablet (10 mg total) by mouth every 6 (six) hours as needed (Nausea or vomiting). (Patient not taking: Reported on 04/20/2020), Disp: 30 tablet, Rfl: 1 .  sennosides-docusate sodium (SENOKOT-S) 8.6-50 MG tablet, Take 1-2 tablets by mouth at bedtime.  (Patient not taking: Reported on 05/17/2020), Disp: , Rfl:   Physical exam:  Vitals:   05/17/20 1410  BP: 130/62  Pulse: 81  Resp: 18  Temp: (!) 96.4 F (35.8 C)  TempSrc: Tympanic  SpO2: 99%   Physical Exam Constitutional:      General: She is not in acute distress. Cardiovascular:     Rate and Rhythm: Normal rate and regular rhythm.     Heart sounds: Normal heart sounds.  Pulmonary:     Effort: Pulmonary effort is normal.     Breath sounds: Normal breath sounds.  Abdominal:     General: Bowel sounds are normal.     Palpations: Abdomen is soft.  Skin:    General: Skin is warm and dry.  Neurological:     Mental Status: She is alert and oriented to person, place, and time.     Motor: No weakness.      CMP Latest Ref Rng & Units 04/29/2020  Glucose 70 - 99  mg/dL 100(H)  BUN 8 - 23 mg/dL 15  Creatinine 0.44 - 1.00 mg/dL 0.89  Sodium 135 - 145 mmol/L 142  Potassium 3.5 - 5.1 mmol/L 3.9  Chloride 98 - 111 mmol/L 104  CO2 22 - 32 mmol/L 30  Calcium 8.9 - 10.3 mg/dL 8.9  Total Protein 6.5 - 8.1 g/dL 6.7  Total Bilirubin 0.3 - 1.2 mg/dL  0.5  Alkaline Phos 38 - 126 U/L 54  AST 15 - 41 U/L 19  ALT 0 - 44 U/L 15   CBC Latest Ref Rng & Units 04/29/2020  WBC 4.0 - 10.5 K/uL 6.4  Hemoglobin 12.0 - 15.0 g/dL 11.3(L)  Hematocrit 36 - 46 % 35.1(L)  Platelets 150 - 400 K/uL 190     Assessment and plan- Patient is a 63 y.o. female withclinical prognostic stage IIIb cT2 cN1 M0 triple negative invasive mammary carcinoma of the right breast.She is s/p 3 cycles of neoadjuvant AC Keytruda chemotherapy followed by bilateral mastectomy and targeted node dissection due tonosignificant response to neoadjuvant chemotherapy. She completed dd ACT followed by radiation to chest wall adjuvantlyShe went on to get bilateral mastectomy and node dissection which showed stage IIIc diseasepT 2pN2 a. this is a routine f/u visit  1. Headaches- reports having these symptoms on and off and havent resolved. Will get mri brain with and without contrast and call her with results  2. She is hesitant to start endocrine therapy for left breast DCIS ER positive. However sinceshe had b/l mastectomy, it would be ok to not opt for endocrine therapy at this time.  3. Stage III metaplastic breast cancer- will remain on clinical surveillance. Given high risk disease, I will check tumor markers and do surveillance imaging yearly  I will see her in 3 months with labs   Visit Diagnosis 1. Primary stabbing headache   2. Malignant neoplasm of lower-outer quadrant of right breast of female, estrogen receptor negative ([place])   3. HX: breast cancer      Dr. Randa Evens, MD, MPH [facility] [phone] 05/20/2020 12:34 PM

## 2020-05-24 ENCOUNTER — Other Ambulatory Visit: Payer: Self-pay | Admitting: Oncology

## 2020-05-25 DIAGNOSIS — C50411 Malignant neoplasm of upper-outer quadrant of right female breast: Secondary | ICD-10-CM | POA: Diagnosis not present

## 2020-05-25 DIAGNOSIS — D0512 Intraductal carcinoma in situ of left breast: Secondary | ICD-10-CM | POA: Diagnosis not present

## 2020-05-26 ENCOUNTER — Other Ambulatory Visit: Payer: Self-pay

## 2020-05-26 ENCOUNTER — Inpatient Hospital Stay: Payer: PPO

## 2020-05-26 DIAGNOSIS — Z95828 Presence of other vascular implants and grafts: Secondary | ICD-10-CM

## 2020-05-26 DIAGNOSIS — C50511 Malignant neoplasm of lower-outer quadrant of right female breast: Secondary | ICD-10-CM | POA: Diagnosis not present

## 2020-05-26 DIAGNOSIS — Z171 Estrogen receptor negative status [ER-]: Secondary | ICD-10-CM

## 2020-05-26 LAB — CBC WITH DIFFERENTIAL/PLATELET
Abs Immature Granulocytes: 0.02 10*3/uL (ref 0.00–0.07)
Basophils Absolute: 0 10*3/uL (ref 0.0–0.1)
Basophils Relative: 0 %
Eosinophils Absolute: 0.3 10*3/uL (ref 0.0–0.5)
Eosinophils Relative: 3 %
HCT: 33.9 % — ABNORMAL LOW (ref 36.0–46.0)
Hemoglobin: 11.3 g/dL — ABNORMAL LOW (ref 12.0–15.0)
Immature Granulocytes: 0 %
Lymphocytes Relative: 15 %
Lymphs Abs: 1.1 10*3/uL (ref 0.7–4.0)
MCH: 28.5 pg (ref 26.0–34.0)
MCHC: 33.3 g/dL (ref 30.0–36.0)
MCV: 85.4 fL (ref 80.0–100.0)
Monocytes Absolute: 0.6 10*3/uL (ref 0.1–1.0)
Monocytes Relative: 9 %
Neutro Abs: 5.3 10*3/uL (ref 1.7–7.7)
Neutrophils Relative %: 73 %
Platelets: 202 10*3/uL (ref 150–400)
RBC: 3.97 MIL/uL (ref 3.87–5.11)
RDW: 13 % (ref 11.5–15.5)
WBC: 7.3 10*3/uL (ref 4.0–10.5)
nRBC: 0 % (ref 0.0–0.2)

## 2020-05-26 LAB — COMPREHENSIVE METABOLIC PANEL
ALT: 17 U/L (ref 0–44)
AST: 20 U/L (ref 15–41)
Albumin: 3.5 g/dL (ref 3.5–5.0)
Alkaline Phosphatase: 60 U/L (ref 38–126)
Anion gap: 9 (ref 5–15)
BUN: 11 mg/dL (ref 8–23)
CO2: 27 mmol/L (ref 22–32)
Calcium: 8.7 mg/dL — ABNORMAL LOW (ref 8.9–10.3)
Chloride: 104 mmol/L (ref 98–111)
Creatinine, Ser: 0.9 mg/dL (ref 0.44–1.00)
GFR calc Af Amer: >60 mL/min (ref >60)
GFR calc non Af Amer: >60 mL/min (ref >60)
Glucose, Bld: 141 mg/dL — ABNORMAL HIGH (ref 70–99)
Potassium: 3.7 mmol/L (ref 3.5–5.1)
Sodium: 140 mmol/L (ref 135–145)
Total Bilirubin: 0.5 mg/dL (ref 0.3–1.2)
Total Protein: 6.8 g/dL (ref 6.5–8.1)

## 2020-05-26 MED ORDER — SODIUM CHLORIDE 0.9% FLUSH
10.0000 mL | INTRAVENOUS | Status: DC | PRN
  Administered 2020-05-26: 10 mL via INTRAVENOUS
  Filled 2020-05-26: qty 10

## 2020-05-26 MED ORDER — HEPARIN SOD (PORK) LOCK FLUSH 100 UNIT/ML IV SOLN
INTRAVENOUS | Status: AC
  Filled 2020-05-26: qty 5

## 2020-05-26 MED ORDER — HEPARIN SOD (PORK) LOCK FLUSH 100 UNIT/ML IV SOLN
500.0000 [IU] | Freq: Once | INTRAVENOUS | Status: AC
  Administered 2020-05-26: 500 [IU] via INTRAVENOUS
  Filled 2020-05-26: qty 5

## 2020-05-27 DIAGNOSIS — R350 Frequency of micturition: Secondary | ICD-10-CM | POA: Diagnosis not present

## 2020-05-27 DIAGNOSIS — N3946 Mixed incontinence: Secondary | ICD-10-CM | POA: Diagnosis not present

## 2020-05-27 LAB — CANCER ANTIGEN 15-3: CA 15-3: 9.5 U/mL (ref 0.0–25.0)

## 2020-05-27 LAB — CANCER ANTIGEN 27.29: CA 27.29: 9.8 U/mL (ref 0.0–38.6)

## 2020-06-01 ENCOUNTER — Other Ambulatory Visit: Payer: Self-pay

## 2020-06-01 ENCOUNTER — Ambulatory Visit
Admission: RE | Admit: 2020-06-01 | Discharge: 2020-06-01 | Disposition: A | Discharge status: Home / Self Care | Payer: PPO | Source: Ambulatory Visit | Attending: Oncology | Admitting: Oncology

## 2020-06-01 ENCOUNTER — Ambulatory Visit: Payer: Medicare HMO | Admitting: Obstetrics and Gynecology

## 2020-06-01 DIAGNOSIS — H538 Other visual disturbances: Secondary | ICD-10-CM | POA: Diagnosis not present

## 2020-06-01 DIAGNOSIS — R519 Headache, unspecified: Secondary | ICD-10-CM | POA: Diagnosis not present

## 2020-06-01 DIAGNOSIS — R9082 White matter disease, unspecified: Secondary | ICD-10-CM | POA: Diagnosis not present

## 2020-06-01 DIAGNOSIS — Z853 Personal history of malignant neoplasm of breast: Secondary | ICD-10-CM | POA: Diagnosis not present

## 2020-06-01 DIAGNOSIS — G4485 Primary stabbing headache: Secondary | ICD-10-CM

## 2020-06-01 MED ORDER — GADOBUTROL 1 MMOL/ML IV SOLN
7.0000 mL | Freq: Once | INTRAVENOUS | Status: AC | PRN
  Administered 2020-06-01: 7 mL via INTRAVENOUS

## 2020-06-14 DIAGNOSIS — I341 Nonrheumatic mitral (valve) prolapse: Secondary | ICD-10-CM | POA: Diagnosis not present

## 2020-06-14 DIAGNOSIS — Z171 Estrogen receptor negative status [ER-]: Secondary | ICD-10-CM

## 2020-06-14 DIAGNOSIS — C50511 Malignant neoplasm of lower-outer quadrant of right female breast: Secondary | ICD-10-CM

## 2020-06-14 NOTE — Progress Notes (Signed)
T/C to pt to discuss SCP visit.   No answer but left message asking pt to return my call.  Awaiting return call.   Survivorship Care Plan visit completed.  Treatment summary reviewed and given to radiation for them to give to patient.  [facility] answers booklet reviewed put into packet to patient.  CARE program and Cancer Transitions discussed with patient along with other resources cancer center offers to patients and caregivers.  Patient verbalized understanding.  SCP packet mailed.  Patient in agreement for APP to have a visit to introduce them to the Survivorship Clinic.  Encouraged patient to call for any questions or concerns.

## 2020-06-15 ENCOUNTER — Other Ambulatory Visit: Payer: PPO

## 2020-06-15 ENCOUNTER — Other Ambulatory Visit: Payer: Self-pay

## 2020-06-15 ENCOUNTER — Ambulatory Visit: Payer: PPO | Admitting: Oncology

## 2020-06-15 ENCOUNTER — Encounter: Payer: Self-pay | Admitting: *Deleted

## 2020-06-15 ENCOUNTER — Encounter: Payer: Self-pay | Admitting: Radiation Oncology

## 2020-06-15 ENCOUNTER — Ambulatory Visit
Admission: RE | Admit: 2020-06-15 | Discharge: 2020-06-15 | Disposition: A | Discharge status: Home / Self Care | Payer: PPO | Source: Ambulatory Visit | Attending: Radiation Oncology | Admitting: Radiation Oncology

## 2020-06-15 ENCOUNTER — Inpatient Hospital Stay: Payer: PPO | Attending: Oncology | Admitting: Oncology

## 2020-06-15 VITALS — BP 135/62 | HR 83 | Temp 97.8°F | Wt 156.9 lb

## 2020-06-15 DIAGNOSIS — Z171 Estrogen receptor negative status [ER-]: Secondary | ICD-10-CM

## 2020-06-15 DIAGNOSIS — R109 Unspecified abdominal pain: Secondary | ICD-10-CM | POA: Insufficient documentation

## 2020-06-15 DIAGNOSIS — C50511 Malignant neoplasm of lower-outer quadrant of right female breast: Secondary | ICD-10-CM

## 2020-06-15 DIAGNOSIS — Z923 Personal history of irradiation: Secondary | ICD-10-CM | POA: Insufficient documentation

## 2020-06-15 DIAGNOSIS — Z9221 Personal history of antineoplastic chemotherapy: Secondary | ICD-10-CM | POA: Insufficient documentation

## 2020-06-15 DIAGNOSIS — C50911 Malignant neoplasm of unspecified site of right female breast: Secondary | ICD-10-CM | POA: Diagnosis present

## 2020-06-15 DIAGNOSIS — Z006 Encounter for examination for normal comparison and control in clinical research program: Secondary | ICD-10-CM | POA: Insufficient documentation

## 2020-06-15 DIAGNOSIS — M8580 Other specified disorders of bone density and structure, unspecified site: Secondary | ICD-10-CM | POA: Insufficient documentation

## 2020-06-15 DIAGNOSIS — Z853 Personal history of malignant neoplasm of breast: Secondary | ICD-10-CM | POA: Diagnosis not present

## 2020-06-15 DIAGNOSIS — Z79811 Long term (current) use of aromatase inhibitors: Secondary | ICD-10-CM | POA: Insufficient documentation

## 2020-06-15 DIAGNOSIS — Z9013 Acquired absence of bilateral breasts and nipples: Secondary | ICD-10-CM | POA: Insufficient documentation

## 2020-06-15 DIAGNOSIS — C50411 Malignant neoplasm of upper-outer quadrant of right female breast: Secondary | ICD-10-CM

## 2020-06-15 NOTE — Progress Notes (Signed)
CLINIC:  Survivorship   REASON FOR VISIT:  Routine follow-up for history of breast cancer.   BRIEF ONCOLOGIC HISTORY:  Oncology History  Breast cancer ([place])  07/30/2019 Initial Diagnosis   Breast cancer ([place])   08/06/2019 Cancer Staging   Staging form: Breast, AJCC 8th Edition - Clinical stage from 08/06/2019: Stage IIIB (cT2, cN1, cM0, G3, ER-, PR-, HER2-) - Signed by Sindy Guadeloupe, MD on 08/07/2019   08/11/2019 -  Chemotherapy   The patient had DOXOrubicin (ADRIAMYCIN) chemo injection 106 mg, 60 mg/m2 = 106 mg, Intravenous,  Once, 4 of 4 cycles Administration: 106 mg (08/11/2019), 106 mg (09/01/2019), 100 mg (09/22/2019), 100 mg (12/02/2019) palonosetron (ALOXI) injection 0.25 mg, 0.25 mg, Intravenous,  Once, 7 of 7 cycles Administration: 0.25 mg (08/11/2019), 0.25 mg (12/16/2019), 0.25 mg (01/06/2020), 0.25 mg (09/01/2019), 0.25 mg (12/23/2019), 0.25 mg (12/30/2019), 0.25 mg (09/22/2019), 0.25 mg (12/02/2019), 0.25 mg (01/20/2020), 0.25 mg (01/27/2020) pegfilgrastim-cbqv (UDENYCA) injection 6 mg, 6 mg, Subcutaneous, Once, 4 of 4 cycles Administration: 6 mg (08/12/2019), 6 mg (09/02/2019), 6 mg (09/23/2019), 6 mg (12/03/2019) cyclophosphamide (CYTOXAN) 1,060 mg in sodium chloride 0.9 % 250 mL chemo infusion, 600 mg/m2 = 1,060 mg, Intravenous,  Once, 4 of 4 cycles Administration: 1,000 mg (09/01/2019), 1,000 mg (09/22/2019), 1,000 mg (12/02/2019) PACLitaxel (TAXOL) 138 mg in sodium chloride 0.9 % 250 mL chemo infusion (</= 79m/m2), 80 mg/m2 = 138 mg, Intravenous,  Once, 4 of 4 cycles Dose modification: 65 mg/m2 (original dose 80 mg/m2, Cycle 6, Reason: Provider Judgment) Administration: 138 mg (12/16/2019), 138 mg (12/23/2019), 114 mg (01/13/2020), 138 mg (12/30/2019), 114 mg (02/03/2020), 114 mg (01/20/2020), 114 mg (01/27/2020), 114 mg (02/10/2020), 114 mg (02/17/2020), 114 mg (02/24/2020), 114 mg (03/02/2020) pembrolizumab (KEYTRUDA) 200 mg in sodium chloride 0.9 % 50 mL chemo infusion, 200 mg (100 % of original dose 200  mg), Intravenous, Once, 3 of 3 cycles Dose modification: 200 mg (original dose 200 mg, Cycle 1, Reason: Provider Judgment) Administration: 200 mg (08/11/2019), 200 mg (09/01/2019), 200 mg (09/22/2019) fosaprepitant (EMEND) 150 mg, dexamethasone (DECADRON) 12 mg in sodium chloride 0.9 % 145 mL IVPB, , Intravenous,  Once, 4 of 4 cycles Administration:  (08/11/2019),  (09/01/2019),  (09/22/2019),  (12/02/2019)  for chemotherapy treatment.    08/26/2019 Genetic Testing   Negative genetic testing. VUS in CHEK2 called c.556A>C identified on the [facility] Common Hereditary Cancers Panel. The Common Hereditary Cancers Panel offered by [facility] includes sequencing and/or deletion duplication testing of the following 48 genes: APC, ATM, AXIN2, BARD1, BMPR1A, BRCA1, BRCA2, BRIP1, CDH1, CDKN2A (p14ARF), CDKN2A (p16INK4a), CKD4, CHEK2, CTNNA1, DICER1, EPCAM (Deletion/duplication testing only), GREM1 (promoter region deletion/duplication testing only), KIT, MEN1, MLH1, MSH2, MSH3, MSH6, MUTYH, NBN, NF1, NHTL1, PALB2, PDGFRA, PMS2, POLD1, POLE, PTEN, RAD50, RAD51C, RAD51D, RNF43, SDHB, SDHC, SDHD, SMAD4, SMARCA4. STK11, TP53, TSC1, TSC2, and VHL.  The following genes were evaluated for sequence changes only: SDHA and HOXB13 c.251G>A variant only. The report date is 08/26/2019.    10/21/2019 Cancer Staging   Staging form: Breast, AJCC 8th Edition - Pathologic stage from 10/21/2019: Stage IIIC (pT2, pN2a, cM0, G3, ER-, PR-, HER2-) - Signed by RSindy Guadeloupe MD on 10/27/2019    INTERVAL HISTORY:  [facility]. [facility] for her history of breast cancer.  Overall, she reports feeling quite well since completing her treatment for triple negative right breast cancer. she is status post surgery, radiation and chemotherapy with Adriamycin and Cytoxan Taxol and Keytruda.  Had bilateral mastectomy in November 2020.  She had 9 lymph nodes removed and 7 were positive.  Completed  radiation to her right breast in June 2021.  She was also found to have DCIS ER positivity of her left breast.   REVIEW OF SYSTEMS:  Review of Systems  Constitutional: Negative for appetite change, fatigue, fever and unexpected weight change.  HENT:   Negative for nosebleeds, sore throat and trouble swallowing.   Eyes: Negative.   Respiratory: Negative.  Negative for cough, shortness of breath and wheezing.   Cardiovascular: Negative.  Negative for chest pain and leg swelling.  Gastrointestinal: Negative for abdominal pain, blood in stool, constipation, diarrhea, nausea and vomiting.  Endocrine: Negative.   Genitourinary: Negative.  Negative for bladder incontinence, hematuria and nocturia.   Musculoskeletal: Negative.  Negative for back pain and flank pain.  Skin: Negative.   Neurological: Negative.  Negative for dizziness, headaches, light-headedness and numbness.  Hematological: Negative.   Psychiatric/Behavioral: Negative.  Negative for confusion. The patient is not nervous/anxious.    Breast: Denies any new nodularity, masses, tenderness, nipple changes, or nipple discharge.     PAST MEDICAL/SURGICAL HISTORY:  Past Medical History:  Diagnosis Date  . Anemia    d/t chemo - last Hgb 10.8  . Anxiety    sees Dr. [name]  . Breast cancer ([place]) 2020  . Cyst of breast    per [name]  . Dysrhythmia    Hx of palpitations  . Family history of breast cancer   . Family history of colon cancer   . Family history of leukemia   . Family history of ovarian cancer   . Fibromyalgia   . GERD (gastroesophageal reflux disease)   . Heart murmur   . Hemorrhoids   . Herpes, genital 04/2015   confirmed with HSV 2 IgG  . Hiatal hernia   . Hypercholesterolemia    Dr. Kyra Searles  . Hyperlipidemia   . IBS (irritable bowel syndrome)    constipation predominant  . IC (interstitial cystitis)    per [facility]  . Mild depression ([place])   . MVP (mitral valve prolapse)    [facility] cards  eval 2012  . Osteopenia    2010/2017, DEXA at [facility];spine and fem neck  . PONV (postoperative nausea and vomiting)   . Vitamin D deficiency    history of   Past Surgical History:  Procedure Laterality Date  . ABDOMINAL HYSTERECTOMY    . APPENDECTOMY    . BLADDER SURGERY     1980's  . BREAST EXCISIONAL BIOPSY Right    benign  . BREAST EXCISIONAL BIOPSY Bilateral    benign  . COLONOSCOPY  09/2014   Dr. [name]  . COLONOSCOPY  2009   at [facility] 1 POLYP (BENIGN)  . excision of breast cysts     hx of multiple cyst aspirations  . EXPLORATORY LAPAROTOMY    . MASTECTOMY MODIFIED RADICAL Bilateral 10/09/2019   Procedure: RIGHT MASTECTOMY MODIFIED RADICAL AND LEFT TOTAL MASTECTOMY;  Surgeon: Rolm Bookbinder, MD;  Location: [facility];  Service: General;  Laterality: Bilateral;  . PORTA CATH INSERTION N/A 08/06/2019   Procedure: PORTA CATH INSERTION;  Surgeon: Katha Cabal, MD;  Location: [facility];  Service: Cardiovascular;  Laterality: N/A;  . PORTACATH PLACEMENT Left 08/05/2019   Procedure: INSERTION PORT-A-CATH, Attempted;  Surgeon: Jules Husbands, MD;  Location: [facility];  Service: General;  Laterality: Left;  . TUBAL LIGATION       ALLERGIES:  Allergies  Allergen Reactions  . Amitiza [Lubiprostone] Other (See Comments)    Overly effective at higher dose  . Bentyl [Dicyclomine Hcl]     Lack of effect  . Celecoxib     REACTION: unspecified  . Cortisone Other (See Comments)    flush  . Mobic [Meloxicam]     Palpitations.  But can tolerate aleve  . Nitrofurantoin Monohyd Macro   . Sulfonamide Derivatives Nausea Only  . Zoloft [Sertraline Hcl] Diarrhea and Nausea Only  . Buspar [Buspirone] Palpitations  . Doxycycline Palpitations    chest pain  . Penicillins Rash     CURRENT MEDICATIONS:  Outpatient Encounter Medications as of 06/15/2020  Medication Sig Note  . acetaminophen (TYLENOL) 650 MG CR tablet Take 650 mg by mouth every 8 (eight) hours as needed for pain.    . Ascorbic Acid (VITAMIN C) 100 MG tablet Take 100 mg by mouth daily.   [name] atorvastatin (LIPITOR) 40 MG tablet Take 40 mg by mouth at bedtime.    . Calcium Carbonate-Vit D-Min (CALCIUM 1200 PO) Take 4 tablets by mouth daily.   . cholecalciferol (VITAMIN D3) 25 MCG (1000 UNIT) tablet Take 1 tablet (1,000 Units total) by mouth daily.   . clonazePAM (KLONOPIN) 0.5 MG tablet Take 0.5 mg by mouth 2 (two) times daily.    . DULoxetine (CYMBALTA) 60 MG capsule Take 60 mg by mouth daily.    . furosemide (LASIX) 20 MG tablet Take 1 tablet (20 mg total) by mouth every other day. (Patient not taking: Reported on 05/17/2020)   . gabapentin (NEURONTIN) 100 MG capsule Take 1-2 capsules (100-200 mg total) by mouth 3 (three) times daily as needed.   [name] gentamicin ointment (GARAMYCIN) 0.1 % Apply 1 application topically in the morning, at noon, and at bedtime. (Patient not taking: Reported on 05/17/2020)   . HYDROcodone-acetaminophen (NORCO/VICODIN) 5-325 MG tablet Take 1 tablet by mouth every 6 (six) hours as needed for moderate pain. (Patient not taking: Reported on 04/20/2020)   . letrozole (FEMARA) 2.5 MG tablet Take 1 tablet (2.5 mg total) by mouth daily. (Patient not taking: Reported on 05/17/2020)   . lidocaine-prilocaine (EMLA) cream APPLY TO AFFECTED AREAS ONCE AS DIRECTED   . loratadine (CLARITIN) 10 MG tablet Take 1 tablet (10 mg total) by mouth daily.   . magic mouthwash w/lidocaine SOLN Take 5 mLs by mouth 4 (four) times daily. 80 ml viscous lidocaine 2%, 80 ml Mylanta, 80 ml Diphenhydramine 12.5 mg/5 ml Elixir, 80 ml Nystatin 100,000 Unit suspension, 80 ml Prednisolone 15 mg/57m, 80 ml Distilled Water. Sig: Swish/Swallow 5-10 ml four times a day as needed. Dispense 480 ml. 3RFs (Patient not taking: Reported on 05/17/2020)   . methocarbamol (ROBAXIN) 500 MG tablet Take 1 tablet (500 mg total) by mouth every 6 (six) hours as needed for muscle spasms.   .Marland Kitchenomeprazole (PRILOSEC) 20 MG capsule Take 20 mg by mouth  daily.   . ondansetron (ZOFRAN) 8 MG tablet Take 1 tablet (8 mg total) by mouth 2 (two) times daily as needed. Start on the third day after chemotherapy. (Patient not taking: Reported on 05/17/2020)   . Plecanatide (TRULANCE) 3 MG TABS Take 3 mg by mouth daily.    . prochlorperazine (COMPAZINE) 10 MG tablet Take 1 tablet (10 mg total) by mouth every 6 (six) hours as needed (Nausea or vomiting). (Patient not taking: Reported on 04/20/2020)   . sennosides-docusate sodium (SENOKOT-S) 8.6-50 MG tablet Take 1-2 tablets by mouth at bedtime.  (  Patient not taking: Reported on 05/17/2020) 01/06/2020: During chemo days take 2 tablet 2 times  aday  . sucralfate (CARAFATE) 1 g tablet Take 1 g by mouth 3 (three) times daily.   . valACYclovir (VALTREX) 500 MG tablet TAKE 1 TABLET BY MOUTH DAILY FOR 3 DAYS AS NEEDED AS DIRECTED   . vitamin B-12 (CYANOCOBALAMIN) 500 MCG tablet Take 500 mcg by mouth daily.    No facility-administered encounter medications on file as of 06/15/2020.     ONCOLOGIC FAMILY HISTORY:  Family History  Problem Relation Age of Onset  . Breast cancer Mother 49  . Hypertension Mother   . Colon cancer Father 36  . Hyperlipidemia Father   . Ovarian cancer Maternal Aunt 80  . Breast cancer Cousin   . Gallbladder disease Paternal Grandmother   . Liver cancer Cousin 70       Malignant  . Cancer Maternal Uncle        unsure type    GENETIC COUNSELING/TESTING: Yes-negative   SOCIAL HISTORY:  Debra Little is married and lives in [place] with her husband.  PHYSICAL EXAMINATION:  Vital Signs: There were no vitals filed for this visit. There were no vitals filed for this visit. Physical Exam Constitutional:      Appearance: Normal appearance.  HENT:     Head: Normocephalic and atraumatic.  Eyes:     Pupils: Pupils are equal, round, and reactive to light.  Cardiovascular:     Rate and Rhythm: Normal rate and regular rhythm.     Heart sounds: Normal heart sounds. No murmur  heard.   Pulmonary:     Effort: Pulmonary effort is normal.     Breath sounds: Normal breath sounds. No wheezing.  Abdominal:     General: Bowel sounds are normal. There is no distension.     Palpations: Abdomen is soft.     Tenderness: There is no abdominal tenderness.  Musculoskeletal:        General: Normal range of motion.     Cervical back: Normal range of motion.  Skin:    General: Skin is warm and dry.     Findings: No rash.  Neurological:     Mental Status: She is alert and oriented to person, place, and time.  Psychiatric:        Judgment: Judgment normal.     LABORATORY DATA:  CBC    Component Value Date/Time   WBC 7.3 05/26/2020 1416   RBC 3.97 05/26/2020 1416   HGB 11.3 (L) 05/26/2020 1416   HCT 33.9 (L) 05/26/2020 1416   PLT 202 05/26/2020 1416   MCV 85.4 05/26/2020 1416   MCH 28.5 05/26/2020 1416   MCHC 33.3 05/26/2020 1416   RDW 13.0 05/26/2020 1416   LYMPHSABS 1.1 05/26/2020 1416   MONOABS 0.6 05/26/2020 1416   EOSABS 0.3 05/26/2020 1416   BASOSABS 0.0 05/26/2020 1416      Chemistry      Component Value Date/Time   NA 140 05/26/2020 1416   K 3.7 05/26/2020 1416   CL 104 05/26/2020 1416   CO2 27 05/26/2020 1416   BUN 11 05/26/2020 1416   CREATININE 0.90 05/26/2020 1416   CREATININE 0.88 06/29/2017 0000      Component Value Date/Time   CALCIUM 8.7 (L) 05/26/2020 1416   ALKPHOS 60 05/26/2020 1416   AST 20 05/26/2020 1416   AST 13 06/29/2017 0000   ALT 17 05/26/2020 1416   ALT 10 06/29/2017 0000   BILITOT  0.5 05/26/2020 1416     DIAGNOSTIC IMAGING:  Bone density-03/16/2020 T score is -1.9.  She is considered to have osteopenia.  RECOMMENDATIONS: 1. All patients should optimize calcium and vitamin D intake. 2. Consider FDA-approved medical therapies in postmenopausal women and men aged 24 years and older, based on the following: a. A hip or vertebral(clinical or morphometric) fracture b. T-score < -2.5 at the femoral neck or spine after  appropriate evaluation to exclude secondary causes c. Low bone mass (T-score between -1.0 and -2.5 at the femoral neck or spine) and a 10-year probability of a hip fracture > 3% or a 10-year probability of a major osteoporosis-related fracture > 20% based on the [place]-adapted WHO algorithm 3. Clinician judgment and/or patient preferences may indicate treatment for people with 10-year fracture probabilities above or below these levels  CT chest/abdomen and pelvis-02/25/2020 IMPRESSION: 1. Bilateral mastectomy. No evidence for lymphadenopathy or metastatic disease in the chest, abdomen, or pelvis. 2. 10 mm left thyroid nodule. No follow-up recommended. (Ref: J Am Coll Radiol. 2015 Feb;12(2): 143-50). 3.  Aortic Atherosclerois (ICD10-170.0)   ASSESSMENT AND PLAN:  Ms.. Nangle is a pleasant 64 y.o. female with history of Stage 3 triple negative breast cancer of right breast and DCIS of left breast.  She is status post bilateral mastectomy, chemotherapy and radiation of the right breast and has recently began letrozole.  She presents to survivorship clinic for surveillance and routine follow-up.  1. History of breast cancer:  Ms. Geathers is currently clinically and radiographically without evidence of disease or recurrence of breast cancer.  She is status post double mastectomy and will not require mammograms moving forward.  She will continue her anti-estrogen therapy with letrozole with plans to continue for 5 years..  She will return to the cancer center to see her medical oncologist, Dr. Janese Banks on 08/09/2020 for follow-up..  I encouraged her to call me with any questions or concerns before her next visit at the cancer center, and I would be happy to see her sooner, if needed.    #. Problem(s) at Visit:   1.  Chronic constipation: Takes Senokot and MiraLAX.  Has follow-up with Dr. Allen Norris.   2.  Fatigue/weight gain: Secondary to treatment and steroids.  Wants to exercise to help with both her fatigue  and weight gain but feels limited with what she should begin working on.  Spoke at length about the care program.  She is interested. Referral placed.   3.  Lymphedema: High risk for lymphedema.  She has already met with Luna Fuse, OT for prevention of lymphedema in bilateral arms.  #. Bone health:  Given Ms. Foronda's age, history of breast cancer, and her current anti-estrogen therapy with letrozole she is at risk for bone demineralization. Her last DEXA scan was from April 2021 which showed a T score of -1.9 which is considered osteopenic.  In the meantime, she was encouraged to increase her consumption of foods rich in calcium, as well as increase her weight-bearing activities.  She was given education on specific food and activities to promote bone health.  #. Cancer screening:  Due to Ms. Waldorf's history and her age, she should receive screening for skin cancers, colon cancer, and gynecologic cancers. She was encouraged to follow-up with her PCP for appropriate cancer screenings.   #. Health maintenance and wellness promotion: Ms. Mcgaugh was encouraged to consume 5-7 servings of fruits and vegetables per day. She was also encouraged to engage in moderate to vigorous exercise  for 30 minutes per day most days of the week. She was instructed to limit her alcohol consumption and continue to abstain from tobacco use.   Breast Cancer Follow-Up Schedule & Surveillance Recommendations Medical History & Physical Exam with your oncologist Every 4-6 months for the first 5 years. These visits will be with either your medical oncologist or your breast surgeon.   Medical History & Physical Exam with your oncologist Annually after 5 years. These visits may be with your medical oncologist, surgeon, or PCP after 5 years. We also offer a [facility] at the cancer center for extended surveillance.  Mammogram This will most likely be done when your next annual mammogram is due.  We like to  wait, however at least 6 months after radiation, so it may be slightly longer in some cases.     Pelvic Exam Due annually, if you are taking Tamoxifen as part of your anti-estrogen therapy   Bone Density Scan (DEXA) Due every 2 years while on aromatase inhibitor therapy, history of osteopenia/osteoporosis, or in post-menopausal women.   General Health & Cancer Screening Recommendations   Physical with your PCP At least annually.  This visit should include labs as indicated by your history and lifestyle.    Colon Cancer Screening  Beginning at age 104; may be done sooner, if clinically indicated.  Generally, a colonoscopy is due every 10 years (unless prior exam warrants more frequent testing).   Pap Smear Due every 3-5 years depending on type of testing done.  Discuss with your PCP or Gynecology Specialist.      Lung Cancer Screening Recommended in people who have a 30 pack year tobacco history, are between ages 75 and 39, and who smoke now, or have quit within the past 15 years.   Skin Cancer Screening and Prevention You should have your skin examined annually either by PCP or dermatology.  Use sunscreen of at least SPF 30 and reapply per label directions, avoid tanning beds, and avoid direct sunlight during peak hours.    Vaccinations (Flu, Shingles, Pneumonia, TDaP, etc.)  Discuss with your PCP     Diet & Exercise Eat plenty of fresh fruits & vegetables.  Exercise daily per your doctor's orders. Exercise can reduce the risk of some cancers.  Tobacco & Alcohol If you use tobacco products, you should stop. Let us know how we can help you!  Limit how much alcohol you drink, if you drink at all.    Resources, Interventions, and Education:        Survivors of cancer often report some of the following needs or concerns after they have completed treatment.  Below are some suggested interventions and resources to help guide you.  Just because your cancer treatment has ended, it does not mean that we  stop helping you manage your needs or concerns.  Please let us know how we can best help you in your new life post-cancer and your return to health and wellness!          Common Needs/Concerns Suggested intervention(s)  Fatigue . Regular physical activity - walking 20 minutes daily . Pure ground root ginseng, 1043m with breakfast and 1008mwith lunch may help . Evaluation for hypothyroidism, anemia, or depression  Memory problems and/or confusion . About 70% of cancer patients have some degree of cognitive dysfunction (meaning memory problems, trouble concentrating, trouble finding the right word, etc.) after treatment.  This usually gets better over time.  . Some patients may  need evaluation for sleep disturbances contributing to memory problems or depression.   Chemotherapy Induced Peripheral Neuropathy . Consider pharmacotherapy with Gabapentin, or Duloxetine . Referral to Physical Therapy: Massage, TENS, balance re-education, strengthening. . Consider acupuncture.  Bone health/osteoporosis risk  Bone densitometry every 1 to 2 years after initiation of aromatase inhibitor (if applicable) and/or at age 53 or older  Consider Vitamin D testing   Calcium intake of 1200 to 1561m per day  Vitamin D supplementation with 1000 IU per day  Weight-bearing exercise  Avoidance of smoking/smoking cessation counseling  Bisphosphonate, if indicated per bone densitometry  Depression/General Anxiety  Consider counseling and/or initiation of pharmacotherapy  Referral to Social Worker   Referral to [place] Call [phone] details.  Cancer Transitions class through the [facility]  Call [phone] details.  Other resources: ANurse, learning disabilityorg, Call the [place]at [phone] additional resources. . Consider participating in art therapy for relaxation.  Classes offered monthly may include adult coloring, card making, painting  and other craft classes.  . Consider talking to a counselor or Clinical Chaplain available through the cancer center.   . Consult with your Primary Care Provider or Oncologist to see if medication might be a good option.  Hot flashes  You should NOT take any hormone (estrogen-containing) supplements, products, medications, or creams without first discussing them with your oncologist.  Depending on the type of breast cancer you had, these hormone-containing products can sometimes increase your risk of the cancer returning.   Some antidepressants have been shown to help with hot flashes.   A drug called, gabapentin, has also been shown to offer some women relief.   Acupuncture may help with hot flashes  Some things you can try for relief:   Avoid caffeine and hot drinks  Avoid hot/spicy foods  Keep your home/bedroom cool and keep an ice pack under your pillow  Dress in layers  Quit smoking (if you smoke)  Reduce alcohol use (if you drink)  For more information about hot flashes, you can visit: hForexFest.nohtml  Insomnia or trouble sleeping  Practice good sleep habits  Discuss treatments of hot flashes  Consider treatment for anxiety  Read this from the [place]http://www.cancer.org/treatment/treatmentsandsideeffects/physicalsideeffects/fatigue/fatigueinpeoplewithcancer/fatigue-in-people-with-cancer-treating-fatigue  Weight gain or overweight  ( BMI over 25.0 m  or weight gain of >15 lbs)   Dietary counseling with a registered dietician  Referral to a weight management support group (e.g. Weight Watchers, Overeaters Anonymous) . If your BMI is greater than 25 or you have gained more than 15 pounds you should work on weight loss. . Attend a healthy cooking class when offered at the cancer center, call [phone] . Consider a weight management support group such as Weight Watchers.  . See if you  qualify for the C.A.R.E. program.  It includes exercise sessions offered Tuesdays & Thursdays 12:15 -1:15. Ask nurse for a doctor referral if you have not been in the program and are seeing doctor at least once every six months or call [phone] the [facility].   Change in your eating habits, your taste, or your smell . Choose foods with tart flavors like lemon wedges, lemonade, citrus fruits, vinegar, and pickled foods.  . Season foods with herbs/spices/or other seasonings like onion, garlic, chili powder, barbecue sauce, mustard, ketchup, mint, etc.  . If meats taste strange, try marinating in citrus, herbal or vinegar-based dressings or sauces.  . If certain foods or drinks smell unpleasant while cooking,  choose foods that do not need to be cooked, like cold sandwiches, crackers and cheese, yogurt and fruit, or cold cereal.  . Keep your mouth clean and healthy by rinsing and brushing your teeth after meals and before bed.   Lymphedema Referral to lymphedema therapy specialist   Weight training  Maintenance of healthy weight: people with a BMI of greater than or equal to 25 at the time of diagnosis have a more than 3 times higher risk of lymphedema . If you are at risk, consider a visit with our Lymphedema specialist to learn how to reduce risk post treatment.  Ask your oncologist for referral. . Maintain a healthy weight. . Some patients may need a preventative compression sleeve to wear when doing repetitive motions, flying or exercising to reduce risk.  Discuss with Lymphedema Specialist or Oncologist.   Decreased range of motion   Referral to Physical Therapy  Consider taking yoga or Tai Chi classes at the [facility].  Call [phone] for a schedule.  Joint aches and pains  Symptom management with medications  Consideration of switch to another aromatase inhibitor, as some can cause joint aches/pain  Consider joining [place] or Yoga classes at [facility]. Call [phone]  for a schedule.   Pain with intercourse/vaginal dryness  Recommendation for vaginal lubricant (Astroglide) or moisturizer (Replens, 1x every 3 days)  Consideration of low dose vaginal estrogen cream (Estring, Premarin, Estrace). You should discuss this option with your medical oncologist before trying any medications or creams containing estrogen.  Consideration of [place]  Referral to Pelvic Floor Rehabilitation Therapist:[phone]  Intimacy and sexuality  Evaluation and treatment for anxiety, depression, as appropriate  Address body image concerns, such as breast asymmetry or absence, by obtaining a prosthesis. Ask your doctor for a prescription.   Attend the American Cancer Society's Look Good.Feel Better program. Call [phone] for a schedule of when this event is offered.   Consider attending support groups.. Call [phone] for details.  Referral to Pelvic therapy for pain, vaginal narrowing, and vaginal dryness  Ask for a list of approved lubricants and vaginal moisturizers  Attend a Cancer Transitions class at [facility]. Call [phone] for details.  Consideration of low dose vaginal estrogen cream (Estring, Premarin, Estrace). You should discuss this option with your medical oncologist before trying any medications or creams containing estrogen.  Marital/partner/family relationships  Referral to Forensic psychologist Groups at the [employer]   Employment, health insurance, and/or finances  Referral to Education officer, museum  Concerns regarding spirituality, faith, coping, relating to God, loss of faith, facing my mortality, and/or loss of my sense of purpose  Referral to Chaplain   Referral to [employer] activities: www.hirschwellnessnetwork.org  Support Groups at the [facility]. Call [phone] for details.  Returning to an exercise program  Consider joining the [facility], a 12-week program that meets  twice a week for 90 minutes using traditional exercise methods to ease you back into fitness and help you maintain a healthy weight.   This program is FREE for you.  Offered at several local [place].    Participate in our Cancer Transitions program or our CARE program.  Call [phone] or [phone]  Are you interested in staying informed of the [facility]'s breast cancer programs?   Consider volunteer opportunities with the [facility].   Consider becoming a mentor to help other cancer survivors at [facility] at [facility] during their cancer experience.  Contact Magdalene Patricia RN at [phone] or [email or web address]  Additional resources for cancer survivors  ASCO, patient education www.cancer.net  [employer] www.livestrong.org  The [employer] for [employer].foundationforwomenscancer.[place] About Herbs website: TheyParty.dk  The Leukemia and Lymphoma Society: PreviewPal.pl  American Cancer Society: http://davis-dillon.net/  American Institute for Cancer Research: FailFactory.se  Cancer Care support services: www.cancercare.org  Centers for Disease Control and Prevention, survivorship information: BasketballBiz.fr  [place]: www.survivorship.cancer.gov  National Comprehensive Cancer Network: NetOpenings.si  MedlinePlus: http://www.parks.biz/.cancers.html  Cancer and Careers: www.cancerandcarrers.org/en  [employer] for [employer]) Employment rights: www.canceradvocacy.org/resources/employment-rights/  [employer] information: https://www.thenccs.org/health-insurance/         Dispo:  -Return to cancer center on 08/09/2020 with lab work and assessment with Dr.  Janese Banks. -Referral placed to care program -Continue follow-up with Dr. Allen Norris for constipation concerns.   A total of (30) minutes of face-to-face time was spent with this patient with greater than 50% of that time in counseling and care-coordination.   Rulon Abide, NP AGNP-C [place] [phone]   Note: [place], [place], [place] [phone]

## 2020-06-15 NOTE — Progress Notes (Signed)
Radiation Oncology Follow up Note  Name: Debra Little   Date:   06/15/2020 MRN:  [id number] DOB: 07/09/1956    This 64 y.o. 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  Seen today in routine follow-up she is doing well.  She specifically denies any chest wall masses or nodularity cough or bone pain..  Left breast type ER positive DCIS she is not starting antiestrogen therapy.  COMPLICATIONS OF TREATMENT: none  FOLLOW UP COMPLIANCE: keeps appointments   PHYSICAL EXAM:  BP 135/62 (BP Location: Left Arm, Patient Position: Sitting, Cuff Size: Normal)   Pulse 83   Temp 97.8 F (36.6 C) (Tympanic)   Wt 156 lb 14.4 oz (71.2 kg)   BMI 24.57 kg/m  Patient status post bilateral mastectomies.  Chest walls are clear without evidence of mass or nodularity no axillary supraclavicular adenopathy is identified.  I really do not detect any significant lymphedema in her right upper extremity.  Well-developed well-nourished patient in NAD. HEENT reveals PERLA, EOMI, discs not visualized.  Oral cavity is clear. No oral mucosal lesions are identified. Neck is clear without evidence of cervical or supraclavicular adenopathy. Lungs are clear to A&P. Cardiac examination is essentially unremarkable with regular rate and rhythm without murmur rub or thrill. Abdomen is benign with no organomegaly or masses noted. Motor sensory and DTR levels are equal and symmetric in the upper and lower  extremities. Cranial nerves II through XII are grossly intact. Proprioception is intact. No peripheral adenopathy or edema is identified. No motor or sensory levels are noted. Crude visual fields are within normal range.  RADIOLOGY RESULTS: No current films to review  PLAN: Present time patient is doing well 1 month out from right chest wall radiation.  And pleased with her overall progress.  I have asked to see her back in 4 to 5 months for follow-up.  Patient continues close follow-up care with medical oncology.  Patient knows to call with any concerns.  I would like to take this opportunity to thank you for allowing me to participate in the care of your patient.Noreene Filbert, MD

## 2020-06-15 NOTE — Research (Signed)
Patient Debra Little returns to clinic as scheduled this afternoon for her Radiation oncology follow up, her Survivorship visit and her 24 week study assessment for the SWOG S1714 study. She has completed her Radiation therapy on 05/06/2020 and does not have to see Dr. Janese Banks, her Medical Oncologist today. Dr. Janese Banks did see patient 4 weeks ago completed the week 24 Physician Treatment Burden scale for the patient's current status. [name] week 24 - S1714 Neuropathy Assessment was completed by this RN assisted by Jeral Fruit, RN. Patient is right side dominant, so the neuropen and tuning fork assessments were completed on the right lower and upper extremities. [name] also completed all of the week 24 study questionnaires including EORTC, CIPN20, PROMIS-29, GSLTPAQ, Patient Reported Symptom Burden, and the new FACT/GOG-NTX.  [name] denies having experienced any falls in the past 6 months, but does report she feels "a little off balance" at times. She was able to complete the get up and go timed exercise as well and reports feeling "slightly off balance" once during the test. Patient is still reporting numbness and tingling in her fingers, hands, toes and feet and states it comes and goes now, and is definitely better since she stopped receiving chemotherapy with Taxol. She does continue to experience occasional "shooting pain" at times, but this is occurring less frequently as well. Her right great toenail remains intact, but has thickened and become painful at times. She has seen a podiatrist for this. Patient confirms she is able to manage all of her ADLs without any assistance. Solicited CTCAE Assessment as noted below with AE grade and attribution verified with Dr. Janese Banks. Patient's pedal edema is now resolved and she reported no longer taking Lasix at her visit on 05/17/2020. She sees OT for the Lymphedema in her right arm, and has a compression glove and sleeve for this. Patient denies using any  cold or compression gloves or having had acupuncture or massage therapy to manage her neuropathy symptoms. She reportedly has a history of Fibromyalgia and has been treated for this with gabapentin since 05/18/2011. Solicited AEs with grade and attribution as noted below:  Study/Protocol: SWOG R4270 Cycle: 24 week solicited AEs Event Grade Onset Date Resolved Date Drug Name Attribution Treatment Comments  Dysesthesia 1 05/18/2011   unrelated gabapentin Reported at baseline  Neuralgia 0        Paresthesia 1 05/18/2011   unrelated gabapentin Reported at baseline  Peripheral motor neuropathy 0        Peripheral sensory neuropathy 1 2 1  06/15/2020 03/16/2020 05/18/2011  06/15/2020 03/16/2020  possibly gabapentin Reported at baseline, increased with paclitaxel, improved since off chemo  Localized edema BLE 2 03/16/2020 05/17/2020  probably Lasix  Resolved with Lasix  Yolande Jolly, BSN, MHA, OCN 06/15/2020 3:04 PM

## 2020-06-23 ENCOUNTER — Inpatient Hospital Stay: Payer: PPO

## 2020-06-23 ENCOUNTER — Other Ambulatory Visit: Payer: Self-pay

## 2020-06-23 ENCOUNTER — Inpatient Hospital Stay (HOSPITAL_BASED_OUTPATIENT_CLINIC_OR_DEPARTMENT_OTHER): Payer: PPO | Admitting: Oncology

## 2020-06-23 ENCOUNTER — Other Ambulatory Visit: Payer: Self-pay | Admitting: Oncology

## 2020-06-23 DIAGNOSIS — R102 Pelvic and perineal pain: Secondary | ICD-10-CM

## 2020-06-23 DIAGNOSIS — Z79811 Long term (current) use of aromatase inhibitors: Secondary | ICD-10-CM | POA: Diagnosis not present

## 2020-06-23 DIAGNOSIS — Z006 Encounter for examination for normal comparison and control in clinical research program: Secondary | ICD-10-CM | POA: Diagnosis present

## 2020-06-23 DIAGNOSIS — R109 Unspecified abdominal pain: Secondary | ICD-10-CM

## 2020-06-23 DIAGNOSIS — Z9221 Personal history of antineoplastic chemotherapy: Secondary | ICD-10-CM | POA: Diagnosis not present

## 2020-06-23 DIAGNOSIS — R300 Dysuria: Secondary | ICD-10-CM

## 2020-06-23 DIAGNOSIS — Z171 Estrogen receptor negative status [ER-]: Secondary | ICD-10-CM | POA: Diagnosis not present

## 2020-06-23 DIAGNOSIS — Z923 Personal history of irradiation: Secondary | ICD-10-CM | POA: Diagnosis not present

## 2020-06-23 DIAGNOSIS — C50911 Malignant neoplasm of unspecified site of right female breast: Secondary | ICD-10-CM | POA: Diagnosis not present

## 2020-06-23 DIAGNOSIS — Z9013 Acquired absence of bilateral breasts and nipples: Secondary | ICD-10-CM | POA: Diagnosis not present

## 2020-06-23 DIAGNOSIS — Z853 Personal history of malignant neoplasm of breast: Secondary | ICD-10-CM

## 2020-06-23 DIAGNOSIS — M25559 Pain in unspecified hip: Secondary | ICD-10-CM | POA: Diagnosis not present

## 2020-06-23 DIAGNOSIS — M8580 Other specified disorders of bone density and structure, unspecified site: Secondary | ICD-10-CM | POA: Diagnosis not present

## 2020-06-23 LAB — CBC WITH DIFFERENTIAL/PLATELET
Abs Immature Granulocytes: 0.03 10*3/uL (ref 0.00–0.07)
Basophils Absolute: 0 10*3/uL (ref 0.0–0.1)
Basophils Relative: 0 %
Eosinophils Absolute: 0.3 10*3/uL (ref 0.0–0.5)
Eosinophils Relative: 4 %
HCT: 35.7 % — ABNORMAL LOW (ref 36.0–46.0)
Hemoglobin: 11.7 g/dL — ABNORMAL LOW (ref 12.0–15.0)
Immature Granulocytes: 0 %
Lymphocytes Relative: 14 %
Lymphs Abs: 1 10*3/uL (ref 0.7–4.0)
MCH: 28.1 pg (ref 26.0–34.0)
MCHC: 32.8 g/dL (ref 30.0–36.0)
MCV: 85.8 fL (ref 80.0–100.0)
Monocytes Absolute: 0.7 10*3/uL (ref 0.1–1.0)
Monocytes Relative: 10 %
Neutro Abs: 4.9 10*3/uL (ref 1.7–7.7)
Neutrophils Relative %: 72 %
Platelets: 210 10*3/uL (ref 150–400)
RBC: 4.16 MIL/uL (ref 3.87–5.11)
RDW: 13.5 % (ref 11.5–15.5)
WBC: 6.9 10*3/uL (ref 4.0–10.5)
nRBC: 0 % (ref 0.0–0.2)

## 2020-06-23 LAB — COMPREHENSIVE METABOLIC PANEL
ALT: 16 U/L (ref 0–44)
AST: 21 U/L (ref 15–41)
Albumin: 3.6 g/dL (ref 3.5–5.0)
Alkaline Phosphatase: 66 U/L (ref 38–126)
Anion gap: 6 (ref 5–15)
BUN: 12 mg/dL (ref 8–23)
CO2: 27 mmol/L (ref 22–32)
Calcium: 8.4 mg/dL — ABNORMAL LOW (ref 8.9–10.3)
Chloride: 104 mmol/L (ref 98–111)
Creatinine, Ser: 0.78 mg/dL (ref 0.44–1.00)
GFR calc Af Amer: >60 mL/min (ref >60)
GFR calc non Af Amer: >60 mL/min (ref >60)
Glucose, Bld: 152 mg/dL — ABNORMAL HIGH (ref 70–99)
Potassium: 3.8 mmol/L (ref 3.5–5.1)
Sodium: 137 mmol/L (ref 135–145)
Total Bilirubin: 0.4 mg/dL (ref 0.3–1.2)
Total Protein: 6.6 g/dL (ref 6.5–8.1)

## 2020-06-23 LAB — URINALYSIS, COMPLETE (UACMP) WITH MICROSCOPIC
Bilirubin Urine: NEGATIVE
Glucose, UA: NEGATIVE mg/dL
Hgb urine dipstick: NEGATIVE
Ketones, ur: NEGATIVE mg/dL
Nitrite: NEGATIVE
Protein, ur: NEGATIVE mg/dL
Specific Gravity, Urine: 1.004 — ABNORMAL LOW (ref 1.005–1.030)
Squamous Epithelial / HPF: NONE SEEN (ref 0–5)
pH: 6 (ref 5.0–8.0)

## 2020-06-23 MED ORDER — FLUCONAZOLE 200 MG PO TABS: 200.0000 mg | ORAL_TABLET | Freq: Every day | ORAL | 0 refills | Status: DC

## 2020-06-23 MED ORDER — CIPROFLOXACIN HCL 250 MG PO TABS: 250.0000 mg | ORAL_TABLET | Freq: Two times a day (BID) | ORAL | 0 refills | Status: DC

## 2020-06-23 NOTE — Progress Notes (Signed)
    Error  Review of systems- ROS  Physical Exam    Faythe Casa, NP 05/30/2021 12:00 PM

## 2020-06-23 NOTE — Progress Notes (Signed)
Symptom Management Consult note [place]  [phone] [phone]  Patient Care Team: Tonia Ghent, MD as PCP - General (Family Medicine) Bary Castilla, Forest Gleason, MD (General Surgery) Modesto Charon, MD (Family Medicine) Nori Riis, PA-C as Physician Assistant (Urology) Teodoro Spray, MD as Consulting Physician (Cardiology) Sindy Guadeloupe, MD as Consulting Physician (Hematology and Oncology) Noreene Filbert, MD as Referring Physician (Radiation Oncology)   Name of the patient: Debra Little  [id number]  05-14-1956   Date of visit: 06/23/2020   Diagnosis-breast cancer  Chief complaint/ Reason for visit-flank pain/pelvic pressure  Heme/Onc history:  Oncology History  Breast cancer ([place])  07/30/2019 Initial Diagnosis   Breast cancer ([place])   08/06/2019 Cancer Staging   Staging form: Breast, AJCC 8th Edition - Clinical stage from 08/06/2019: Stage IIIB (cT2, cN1, cM0, G3, ER-, PR-, HER2-) - Signed by Sindy Guadeloupe, MD on 08/07/2019   08/11/2019 -  Chemotherapy   The patient had DOXOrubicin (ADRIAMYCIN) chemo injection 106 mg, 60 mg/m2 = 106 mg, Intravenous,  Once, 4 of 4 cycles Administration: 106 mg (08/11/2019), 106 mg (09/01/2019), 100 mg (09/22/2019), 100 mg (12/02/2019) palonosetron (ALOXI) injection 0.25 mg, 0.25 mg, Intravenous,  Once, 7 of 7 cycles Administration: 0.25 mg (08/11/2019), 0.25 mg (12/16/2019), 0.25 mg (01/06/2020), 0.25 mg (09/01/2019), 0.25 mg (12/23/2019), 0.25 mg (12/30/2019), 0.25 mg (09/22/2019), 0.25 mg (12/02/2019), 0.25 mg (01/20/2020), 0.25 mg (01/27/2020) pegfilgrastim-cbqv (UDENYCA) injection 6 mg, 6 mg, Subcutaneous, Once, 4 of 4 cycles Administration: 6 mg (08/12/2019), 6 mg (09/02/2019), 6 mg (09/23/2019), 6 mg (12/03/2019) cyclophosphamide (CYTOXAN) 1,060 mg in sodium chloride 0.9 % 250 mL chemo infusion, 600 mg/m2 = 1,060 mg, Intravenous,  Once, 4 of 4 cycles Administration: 1,000 mg (09/01/2019), 1,000 mg  (09/22/2019), 1,000 mg (12/02/2019) PACLitaxel (TAXOL) 138 mg in sodium chloride 0.9 % 250 mL chemo infusion (</= 31m/m2), 80 mg/m2 = 138 mg, Intravenous,  Once, 4 of 4 cycles Dose modification: 65 mg/m2 (original dose 80 mg/m2, Cycle 6, Reason: Provider Judgment) Administration: 138 mg (12/16/2019), 138 mg (12/23/2019), 114 mg (01/13/2020), 138 mg (12/30/2019), 114 mg (02/03/2020), 114 mg (01/20/2020), 114 mg (01/27/2020), 114 mg (02/10/2020), 114 mg (02/17/2020), 114 mg (02/24/2020), 114 mg (03/02/2020) pembrolizumab (KEYTRUDA) 200 mg in sodium chloride 0.9 % 50 mL chemo infusion, 200 mg (100 % of original dose 200 mg), Intravenous, Once, 3 of 3 cycles Dose modification: 200 mg (original dose 200 mg, Cycle 1, Reason: Provider Judgment) Administration: 200 mg (08/11/2019), 200 mg (09/01/2019), 200 mg (09/22/2019) fosaprepitant (EMEND) 150 mg, dexamethasone (DECADRON) 12 mg in sodium chloride 0.9 % 145 mL IVPB, , Intravenous,  Once, 4 of 4 cycles Administration:  (08/11/2019),  (09/01/2019),  (09/22/2019),  (12/02/2019)  for chemotherapy treatment.    08/26/2019 Genetic Testing   Negative genetic testing. VUS in CHEK2 called c.556A>C identified on the [facility] Common Hereditary Cancers Panel. The Common Hereditary Cancers Panel offered by [facility] includes sequencing and/or deletion duplication testing of the following 48 genes: APC, ATM, AXIN2, BARD1, BMPR1A, BRCA1, BRCA2, BRIP1, CDH1, CDKN2A (p14ARF), CDKN2A (p16INK4a), CKD4, CHEK2, CTNNA1, DICER1, EPCAM (Deletion/duplication testing only), GREM1 (promoter region deletion/duplication testing only), KIT, MEN1, MLH1, MSH2, MSH3, MSH6, MUTYH, NBN, NF1, NHTL1, PALB2, PDGFRA, PMS2, POLD1, POLE, PTEN, RAD50, RAD51C, RAD51D, RNF43, SDHB, SDHC, SDHD, SMAD4, SMARCA4. STK11, TP53, TSC1, TSC2, and VHL.  The following genes were evaluated for sequence changes only: SDHA and HOXB13 c.251G>A variant only. The report date is 08/26/2019.    10/21/2019 Cancer Staging  Staging form: Breast,  AJCC 8th Edition - Pathologic stage from 10/21/2019: Stage IIIC (pT2, pN2a, cM0, G3, ER-, PR-, HER2-) - Signed by Sindy Guadeloupe, MD on 10/27/2019    Interval history-Mrs. Klinkner is a 64 year old female with past medical history significant for hyperlipidemia, anxiety and depression, neuralgia and bilateral breast cancer status post double mastectomy, chemoradiation and currently on Femara.  She is followed by Dr. Janese Banks and currently on surveillance.  She last saw Dr. Janese Banks on 05/17/2020 for follow-up where she reported ongoing fatigue, intermittent headaches and occasional balance concerns.  She was scheduled for follow-up in approximately 3 months.  In the interim, she has been doing well.  She was seen in our survivorship clinic last week and noted some chronic constipation and concerns for fatigue and persistent weight gain.  She was referred to our care program.  States she is followed by GI and has chronic IBS rotating constipation to diarrhea.  Takes Trulance and Senokot daily.  Today, patient called clinic complaining of bilateral flank pain, pelvic pressure and one episode of burning with urination.  Symptoms are intermittent.  Symptoms alleviated with rest and Tylenol and exacerbated with movement and standing for long periods of time.  Yesterday she spent most of her day in her recliner because she felt "off".  Denies any fevers.  No recent UTIs.  Last in March 2021.    She denies any nausea or vomiting.  Her appetite is stable.  Has occasional shortness of breath with exertion but this is chronic.  Endorses fatigue and malaise.  Denies chest pain.   ECOG FS:1 - Symptomatic but completely ambulatory  Review of systems- Review of Systems  Constitutional: Positive for malaise/fatigue. Negative for chills, fever and weight loss.  HENT: Negative for congestion, ear pain and tinnitus.   Eyes: Negative.  Negative for blurred vision and double vision.  Respiratory: Negative.  Negative for cough,  sputum production and shortness of breath.   Cardiovascular: Negative.  Negative for chest pain, palpitations and leg swelling.  Gastrointestinal: Negative.  Negative for abdominal pain, constipation, diarrhea, nausea and vomiting.  Genitourinary: Positive for flank pain. Negative for dysuria, frequency and urgency.  Musculoskeletal: Positive for back pain and joint pain. Negative for falls.  Skin: Negative.  Negative for rash.  Neurological: Positive for weakness. Negative for headaches.  Endo/Heme/Allergies: Negative.  Does not bruise/bleed easily.  Psychiatric/Behavioral: Negative.  Negative for depression. The patient is not nervous/anxious and does not have insomnia.      Current treatment-surveillance-status post chemoradiation  Allergies  Allergen Reactions  . Amitiza [Lubiprostone] Other (See Comments)    Overly effective at higher dose  . Bentyl [Dicyclomine Hcl]     Lack of effect  . Celecoxib     REACTION: unspecified  . Cortisone Other (See Comments)    flush  . Mobic [Meloxicam]     Palpitations.  But can tolerate aleve  . Nitrofurantoin Monohyd Macro   . Sulfonamide Derivatives Nausea Only  . Zoloft [Sertraline Hcl] Diarrhea and Nausea Only  . Buspar [Buspirone] Palpitations  . Doxycycline Palpitations    chest pain  . Penicillins Rash     Past Medical History:  Diagnosis Date  . Anemia    d/t chemo - last Hgb 10.8  . Anxiety    sees Dr. [name]  . Breast cancer ([place]) 2020  . Cyst of breast    per [name]  . Dysrhythmia    Hx of palpitations  . Family history of breast  cancer   . Family history of colon cancer   . Family history of leukemia   . Family history of ovarian cancer   . Fibromyalgia   . GERD (gastroesophageal reflux disease)   . Heart murmur   . Hemorrhoids   . Herpes, genital 04/2015   confirmed with HSV 2 IgG  . Hiatal hernia   . Hypercholesterolemia    Dr. Kyra Searles  . Hyperlipidemia   . IBS (irritable bowel syndrome)     constipation predominant  . IC (interstitial cystitis)    per [facility]  . Mild depression ([place])   . MVP (mitral valve prolapse)    [facility] cards eval 01-06-2011  . Osteopenia    2010/2017, DEXA at [facility];spine and fem neck  . PONV (postoperative nausea and vomiting)   . Vitamin D deficiency    history of     Past Surgical History:  Procedure Laterality Date  . ABDOMINAL HYSTERECTOMY    . APPENDECTOMY    . BLADDER SURGERY     1980's  . BREAST EXCISIONAL BIOPSY Right    benign  . BREAST EXCISIONAL BIOPSY Bilateral    benign  . COLONOSCOPY  09/2014   Dr. [name]  . COLONOSCOPY  07-Jan-2008   at [facility] 1 POLYP (BENIGN)  . excision of breast cysts     hx of multiple cyst aspirations  . EXPLORATORY LAPAROTOMY    . MASTECTOMY MODIFIED RADICAL Bilateral 10/09/2019   Procedure: RIGHT MASTECTOMY MODIFIED RADICAL AND LEFT TOTAL MASTECTOMY;  Surgeon: Rolm Bookbinder, MD;  Location: [facility];  Service: General;  Laterality: Bilateral;  . PORTA CATH INSERTION N/A 08/06/2019   Procedure: PORTA CATH INSERTION;  Surgeon: Katha Cabal, MD;  Location: [facility];  Service: Cardiovascular;  Laterality: N/A;  . PORTACATH PLACEMENT Left 08/05/2019   Procedure: INSERTION PORT-A-CATH, Attempted;  Surgeon: Jules Husbands, MD;  Location: [facility];  Service: General;  Laterality: Left;  . TUBAL LIGATION      Social History   Socioeconomic History  . Marital status: Divorced    Spouse name: Not on file  . Number of children: 1  . Years of education: Not on file  . Highest education level: Not on file  Occupational History  . Occupation: [employer]  Tobacco Use  . Smoking status: Never Smoker  . Smokeless tobacco: Never Used  Vaping Use  . Vaping Use: Never used  Substance and Sexual Activity  . Alcohol use: No    Alcohol/week: 0.0 standard drinks  . Drug use: No  . Sexual activity: Not Currently    Birth control/protection: Surgical    Comment: Hysterectomy  Other Topics Concern  .  Not on file  Social History Narrative   Married in 06-Jan-1997, divorced as of 2017/01/06, 1 son from previous relationship   Brother with MVA at 61, in rest home for many years as of 01/06/18- died of covid recently   Social Determinants of Radio broadcast assistant Strain: [place]   . Difficulty of Paying Living Expenses: Not hard at all  Food Insecurity: No Food Insecurity  . Worried About Charity fundraiser in the Last Year: Never true  . Ran Out of Food in the Last Year: Never true  Transportation Needs: No Transportation Needs  . Lack of Transportation (Medical): No  . Lack of Transportation (Non-Medical): No  Physical Activity: Inactive  . Days of Exercise per Week: 0 days  . Minutes of Exercise per Session: 0 min  Stress: No Stress Concern Present  . Feeling of Stress : Not at all  Social Connections:   . Frequency of Communication with Friends and Family:   . Frequency of Social Gatherings with Friends and Family:   . Attends Religious Services:   . Active Member of Clubs or Organizations:   . Attends Archivist Meetings:   [name] Marital Status:   Intimate Partner Violence: Not At Risk  . Fear of Current or Ex-Partner: No  . Emotionally Abused: No  . Physically Abused: No  . Sexually Abused: No    Family History  Problem Relation Age of Onset  . Breast cancer Mother 53  . Hypertension Mother   . Colon cancer Father 91  . Hyperlipidemia Father   . Ovarian cancer Maternal Aunt 80  . Breast cancer Cousin   . Gallbladder disease Paternal Grandmother   . Liver cancer Cousin 70       Malignant  . Cancer Maternal Uncle        unsure type     Current Outpatient Medications:  .  acetaminophen (TYLENOL) 650 MG CR tablet, Take 650 mg by mouth every 8 (eight) hours as needed for pain., Disp: , Rfl:  .  Ascorbic Acid (VITAMIN C) 100 MG tablet, Take 100 mg by mouth daily., Disp: , Rfl:  .  atorvastatin (LIPITOR) 40 MG tablet, Take 40 mg by mouth at bedtime. , Disp: , Rfl:  1 .  Calcium Carbonate-Vit D-Min (CALCIUM 1200 PO), Take 4 tablets by mouth daily., Disp: , Rfl:  .  cholecalciferol (VITAMIN D3) 25 MCG (1000 UNIT) tablet, Take 1 tablet (1,000 Units total) by mouth daily., Disp: , Rfl:  .  ciprofloxacin (CIPRO) 250 MG tablet, Take 1 tablet (250 mg total) by mouth 2 (two) times daily., Disp: 10 tablet, Rfl: 0 .  clonazePAM (KLONOPIN) 0.5 MG tablet, Take 0.5 mg by mouth 2 (two) times daily. , Disp: , Rfl:  .  DULoxetine (CYMBALTA) 60 MG capsule, Take 60 mg by mouth daily. , Disp: , Rfl:  .  fluconazole (DIFLUCAN) 200 MG tablet, Take 1 tablet (200 mg total) by mouth daily., Disp: 2 tablet, Rfl: 0 .  furosemide (LASIX) 20 MG tablet, Take 1 tablet (20 mg total) by mouth every other day. (Patient not taking: Reported on 05/17/2020), Disp: , Rfl:  .  gabapentin (NEURONTIN) 100 MG capsule, Take 1-2 capsules (100-200 mg total) by mouth 3 (three) times daily as needed., Disp: 90 capsule, Rfl: 1 .  gentamicin ointment (GARAMYCIN) 0.1 %, Apply 1 application topically in the morning, at noon, and at bedtime. (Patient not taking: Reported on 05/17/2020), Disp: , Rfl:  .  HYDROcodone-acetaminophen (NORCO/VICODIN) 5-325 MG tablet, Take 1 tablet by mouth every 6 (six) hours as needed for moderate pain. (Patient not taking: Reported on 04/20/2020), Disp: 15 tablet, Rfl: 0 .  letrozole (FEMARA) 2.5 MG tablet, Take 1 tablet (2.5 mg total) by mouth daily. (Patient not taking: Reported on 05/17/2020), Disp: 30 tablet, Rfl: 3 .  lidocaine-prilocaine (EMLA) cream, APPLY TO AFFECTED AREAS ONCE AS DIRECTED, Disp: 30 g, Rfl: 3 .  loratadine (CLARITIN) 10 MG tablet, Take 1 tablet (10 mg total) by mouth daily., Disp: , Rfl:  .  magic mouthwash w/lidocaine SOLN, Take 5 mLs by mouth 4 (four) times daily. 80 ml viscous lidocaine 2%, 80 ml Mylanta, 80 ml Diphenhydramine 12.5 mg/5 ml Elixir, 80 ml Nystatin 100,000 Unit suspension, 80 ml Prednisolone 15 mg/16m, 80 ml Distilled Water. Sig: Swish/Swallow  5-10 ml four times a day as needed. Dispense 480 ml. 3RFs (Patient not taking: Reported on 05/17/2020), Disp: 480 mL, Rfl: 3 .  methocarbamol (ROBAXIN) 500 MG tablet, Take 1 tablet (500 mg total) by mouth every 6 (six) hours as needed for muscle spasms., Disp: 30 tablet, Rfl: 1 .  omeprazole (PRILOSEC) 20 MG capsule, Take 20 mg by mouth daily., Disp: , Rfl:  .  ondansetron (ZOFRAN) 8 MG tablet, Take 1 tablet (8 mg total) by mouth 2 (two) times daily as needed. Start on the third day after chemotherapy. (Patient not taking: Reported on 05/17/2020), Disp: 30 tablet, Rfl: 1 .  Plecanatide (TRULANCE) 3 MG TABS, Take 3 mg by mouth daily. , Disp: , Rfl:  .  prochlorperazine (COMPAZINE) 10 MG tablet, Take 1 tablet (10 mg total) by mouth every 6 (six) hours as needed (Nausea or vomiting). (Patient not taking: Reported on 04/20/2020), Disp: 30 tablet, Rfl: 1 .  sennosides-docusate sodium (SENOKOT-S) 8.6-50 MG tablet, Take 1-2 tablets by mouth at bedtime.  (Patient not taking: Reported on 05/17/2020), Disp: , Rfl:  .  sucralfate (CARAFATE) 1 g tablet, Take 1 g by mouth 3 (three) times daily., Disp: , Rfl:  .  valACYclovir (VALTREX) 500 MG tablet, TAKE 1 TABLET BY MOUTH DAILY FOR 3 DAYS AS NEEDED AS DIRECTED, Disp: 90 tablet, Rfl: 0 .  vitamin B-12 (CYANOCOBALAMIN) 500 MCG tablet, Take 500 mcg by mouth daily., Disp: , Rfl:   Physical exam: There were no vitals filed for this visit. Physical Exam Constitutional:      Appearance: Normal appearance.  HENT:     Head: Normocephalic and atraumatic.  Eyes:     Pupils: Pupils are equal, round, and reactive to light.  Cardiovascular:     Rate and Rhythm: Normal rate and regular rhythm.     Heart sounds: Normal heart sounds. No murmur heard.   Pulmonary:     Effort: Pulmonary effort is normal.     Breath sounds: Normal breath sounds. No wheezing.  Abdominal:     General: Bowel sounds are normal. There is no distension.     Palpations: Abdomen is soft.      Tenderness: There is no abdominal tenderness.  Musculoskeletal:        General: Normal range of motion.     Cervical back: Normal range of motion.  Skin:    General: Skin is warm and dry.     Findings: No rash.  Neurological:     Mental Status: She is alert and oriented to person, place, and time.  Psychiatric:        Judgment: Judgment normal.      CMP Latest Ref Rng & Units 05/26/2020  Glucose 70 - 99 mg/dL 141(H)  BUN 8 - 23 mg/dL 11  Creatinine 0.44 - 1.00 mg/dL 0.90  Sodium 135 - 145 mmol/L 140  Potassium 3.5 - 5.1 mmol/L 3.7  Chloride 98 - 111 mmol/L 104  CO2 22 - 32 mmol/L 27  Calcium 8.9 - 10.3 mg/dL 8.7(L)  Total Protein 6.5 - 8.1 g/dL 6.8  Total Bilirubin 0.3 - 1.2 mg/dL 0.5  Alkaline Phos 38 - 126 U/L 60  AST 15 - 41 U/L 20  ALT 0 - 44 U/L 17   CBC Latest Ref Rng & Units 06/23/2020  WBC 4.0 - 10.5 K/uL 6.9  Hemoglobin 12.0 - 15.0 g/dL 11.7(L)  Hematocrit 36 - 46 % 35.7(L)  Platelets 150 - 400 K/uL 210    No images  are attached to the encounter.  MR Brain W Wo Contrast  Result Date: 06/01/2020 CLINICAL DATA:  Visual disturbance, sharp pains in the head, history of breast cancer EXAM: MRI HEAD WITHOUT AND WITH CONTRAST TECHNIQUE: Multiplanar, multiecho pulse sequences of the brain and surrounding structures were obtained without and with intravenous contrast. CONTRAST:  75m GADAVIST GADOBUTROL 1 MMOL/ML IV SOLN COMPARISON:  None. FINDINGS: Brain: There is no acute infarction or intracranial hemorrhage. There is no intracranial mass, mass effect, or edema. There is no hydrocephalus or extra-axial fluid collection. Ventricles and sulci are normal in size and configuration. Minimal small foci of T2 hyperintensity in the supratentorial white matter are nonspecific but may reflect minor chronic microvascular ischemic changes in a patient of this age. No abnormal enhancement. Vascular: Major vessel flow voids at the skull base are preserved. Skull and upper cervical spine:  Normal marrow signal is preserved. Sinuses/Orbits: Paranasal sinuses are aerated. Orbits are unremarkable. Other: Sella is unremarkable.  Mastoid air cells are clear. IMPRESSION: No acute abnormality. No evidence of intracranial metastatic disease. Electronically Signed   By: PMacy MisM.D.   On: 06/01/2020 14:35   Assessment and plan- Patient is a 64y.o. female who presents to symptom management for concerns of bilateral lower flank pain, pelvic pressure and one episode of burning with urination X 2-3 days.   Flank pain/pelvic pain-we will rule out UTI.  Patient has had a UTI in the past and treated with Cipro back in March 2021.  Urinalysis was positive for large amount of leukocytes and some bacteria.  Would recommend empirical coverage with Cipro 200 mg twice daily x5 days.  Shortness of breath with exertion-has history of mitral valve prolapse and frequent PVCs.  She is followed by cardiology Dr. FUbaldo Glassing  This is a chronic problem.  Lab work is stable.  Yeast infection after antibiotic therapy: We will go ahead and prescribe Diflucan 200 mg x 2 doses if she needs it while taking her antibiotics.  Fatigue: Secondary to recent chemoradiation.  Referral placed for our care program.   Plan: Rx Cipro 250 mg twice daily x5 days. Rx Diflucan 200 mg X 2 doses. Continue Tylenol as needed. Referral to care program.  Disposition: RTC as scheduled.  Visit Diagnosis 1. HX: breast cancer   2. Flank pain   3. Pain in joint involving pelvic region and thigh, unspecified laterality   4. Dysuria     Patient expressed understanding and was in agreement with this plan. She also understands that She can call clinic at any time with any questions, concerns, or complaints.   Greater than 50% was spent in counseling and coordination of care with this patient including but not limited to discussion of the relevant topics above (See A&P) including, but not limited to diagnosis and management of acute  and chronic medical conditions.   Thank you for allowing me to participate in the care of this very pleasant patient.    JJacquelin Hawking NP CWilliamsburgat AChesterfield Surgery CenterCell - [phone]- [phone]/28/2021 3:46 PM

## 2020-06-24 DIAGNOSIS — R350 Frequency of micturition: Secondary | ICD-10-CM | POA: Diagnosis not present

## 2020-06-25 ENCOUNTER — Telehealth: Payer: Self-pay | Admitting: Internal Medicine

## 2020-06-25 ENCOUNTER — Telehealth: Payer: Self-pay | Admitting: *Deleted

## 2020-06-25 LAB — URINE CULTURE: Culture: 30000 — AB

## 2020-06-25 MED ORDER — SULFAMETHOXAZOLE-TRIMETHOPRIM 800-160 MG PO TABS
1.0000 | ORAL_TABLET | Freq: Two times a day (BID) | ORAL | 0 refills | Status: DC
Start: 2020-06-25 — End: 2020-07-12

## 2020-06-25 NOTE — Telephone Encounter (Signed)
Patient called reporting that the medicine Cipro she was put on for UTI is making her heart skip and is asking what to do. Please advise

## 2020-06-25 NOTE — Telephone Encounter (Signed)
Per Lorretta Harp sent in prescription for Bactrim

## 2020-06-25 NOTE — Telephone Encounter (Signed)
Spoke to patient regarding her concerns for palpitations ciprofloxacin; patient has multiple allergies to medications.  Recommend total of 3 days of ciprofloxacin [1 more pill today] and then stop.  S/J- Recommend repeating urine culture in 1 week; please order and follow-up

## 2020-06-27 NOTE — Telephone Encounter (Signed)
Thanks so much. I will follow-up.  Faythe Casa, NP 06/27/2020 11:07 AM

## 2020-07-01 ENCOUNTER — Other Ambulatory Visit: Payer: Self-pay

## 2020-07-01 ENCOUNTER — Encounter: Payer: Self-pay | Admitting: Nurse Practitioner

## 2020-07-01 ENCOUNTER — Inpatient Hospital Stay: Payer: PPO | Attending: Nurse Practitioner | Admitting: Nurse Practitioner

## 2020-07-01 VITALS — BP 127/59 | HR 90 | Temp 97.6°F | Resp 18 | Wt 156.8 lb

## 2020-07-01 DIAGNOSIS — F418 Other specified anxiety disorders: Secondary | ICD-10-CM | POA: Diagnosis not present

## 2020-07-01 DIAGNOSIS — Z171 Estrogen receptor negative status [ER-]: Secondary | ICD-10-CM | POA: Insufficient documentation

## 2020-07-01 DIAGNOSIS — R5381 Other malaise: Secondary | ICD-10-CM | POA: Insufficient documentation

## 2020-07-01 DIAGNOSIS — Z9221 Personal history of antineoplastic chemotherapy: Secondary | ICD-10-CM | POA: Insufficient documentation

## 2020-07-01 DIAGNOSIS — R109 Unspecified abdominal pain: Secondary | ICD-10-CM

## 2020-07-01 DIAGNOSIS — D649 Anemia, unspecified: Secondary | ICD-10-CM | POA: Diagnosis not present

## 2020-07-01 DIAGNOSIS — K589 Irritable bowel syndrome without diarrhea: Secondary | ICD-10-CM | POA: Insufficient documentation

## 2020-07-01 DIAGNOSIS — Z79899 Other long term (current) drug therapy: Secondary | ICD-10-CM | POA: Insufficient documentation

## 2020-07-01 DIAGNOSIS — R5383 Other fatigue: Secondary | ICD-10-CM | POA: Diagnosis not present

## 2020-07-01 DIAGNOSIS — R140 Abdominal distension (gaseous): Secondary | ICD-10-CM | POA: Diagnosis not present

## 2020-07-01 DIAGNOSIS — Z9013 Acquired absence of bilateral breasts and nipples: Secondary | ICD-10-CM | POA: Diagnosis not present

## 2020-07-01 DIAGNOSIS — E785 Hyperlipidemia, unspecified: Secondary | ICD-10-CM | POA: Diagnosis not present

## 2020-07-01 DIAGNOSIS — Z5112 Encounter for antineoplastic immunotherapy: Secondary | ICD-10-CM | POA: Insufficient documentation

## 2020-07-01 DIAGNOSIS — R102 Pelvic and perineal pain: Secondary | ICD-10-CM | POA: Insufficient documentation

## 2020-07-01 DIAGNOSIS — Z853 Personal history of malignant neoplasm of breast: Secondary | ICD-10-CM | POA: Diagnosis not present

## 2020-07-01 NOTE — Progress Notes (Signed)
Pt continues to have pelvic pressure.No blood noted vaginally. Was diagnosed with a UTI last week. Completed Cipro. Stays really tired/ fayigued. Completed chemo and radiation in June. Feels more tired now then when she was taking chemo. States she sweats with doing housework. Pelvic pressure feels better when she is sitting or laying down. Has pain in R breast area from surgery and lymph node removal. She has anxiety thinking her cancer is back. States I have the aggressive type of breast cancer and it was in 7 lymph nodes. Waiting on the exercise class to call her which may help improve energy.

## 2020-07-06 NOTE — Progress Notes (Signed)
Symptom Management Clinic [facility]  [phone] [phone]  Patient Care Team: Joaquim Nam, MD as PCP - General (Family Medicine) Lemar Livings, Merrily Pew, MD (General Surgery) Arta Silence, MD (Family Medicine) Harle Battiest, PA-C as Physician Assistant (Urology) Dalia Heading, MD as Consulting Physician (Cardiology) Creig Hines, MD as Consulting Physician (Hematology and Oncology) Carmina Miller, MD as Referring Physician (Radiation Oncology)   Name of the patient: Debra Little  [id number]  July 31, 1956   Date of visit: 07/06/20  Diagnosis-stage IIIb breast cancer  Chief complaint/ Reason for visit-flank pain and pelvic pressure  Heme/Onc history:  Oncology History  Breast cancer (HCC)  07/30/2019 Initial Diagnosis   Breast cancer (HCC)   08/06/2019 Cancer Staging   Staging form: Breast, AJCC 8th Edition - Clinical stage from 08/06/2019: Stage IIIB (cT2, cN1, cM0, G3, ER-, PR-, HER2-) - Signed by Creig Hines, MD on 08/07/2019   08/11/2019 -  Chemotherapy   The patient had DOXOrubicin (ADRIAMYCIN) chemo injection 106 mg, 60 mg/m2 = 106 mg, Intravenous,  Once, 4 of 4 cycles Administration: 106 mg (08/11/2019), 106 mg (09/01/2019), 100 mg (09/22/2019), 100 mg (12/02/2019) palonosetron (ALOXI) injection 0.25 mg, 0.25 mg, Intravenous,  Once, 7 of 7 cycles Administration: 0.25 mg (08/11/2019), 0.25 mg (12/16/2019), 0.25 mg (01/06/2020), 0.25 mg (09/01/2019), 0.25 mg (12/23/2019), 0.25 mg (12/30/2019), 0.25 mg (09/22/2019), 0.25 mg (12/02/2019), 0.25 mg (01/20/2020), 0.25 mg (01/27/2020) pegfilgrastim-cbqv (UDENYCA) injection 6 mg, 6 mg, Subcutaneous, Once, 4 of 4 cycles Administration: 6 mg (08/12/2019), 6 mg (09/02/2019), 6 mg (09/23/2019), 6 mg (12/03/2019) cyclophosphamide (CYTOXAN) 1,060 mg in sodium chloride 0.9 % 250 mL chemo infusion, 600 mg/m2 = 1,060 mg, Intravenous,  Once, 4 of 4 cycles Administration: 1,000 mg (09/01/2019), 1,000 mg  (09/22/2019), 1,000 mg (12/02/2019) PACLitaxel (TAXOL) 138 mg in sodium chloride 0.9 % 250 mL chemo infusion (</= 80mg /m2), 80 mg/m2 = 138 mg, Intravenous,  Once, 4 of 4 cycles Dose modification: 65 mg/m2 (original dose 80 mg/m2, Cycle 6, Reason: Provider Judgment) Administration: 138 mg (12/16/2019), 138 mg (12/23/2019), 114 mg (01/13/2020), 138 mg (12/30/2019), 114 mg (02/03/2020), 114 mg (01/20/2020), 114 mg (01/27/2020), 114 mg (02/10/2020), 114 mg (02/17/2020), 114 mg (02/24/2020), 114 mg (03/02/2020) pembrolizumab (KEYTRUDA) 200 mg in sodium chloride 0.9 % 50 mL chemo infusion, 200 mg (100 % of original dose 200 mg), Intravenous, Once, 3 of 3 cycles Dose modification: 200 mg (original dose 200 mg, Cycle 1, Reason: Provider Judgment) Administration: 200 mg (08/11/2019), 200 mg (09/01/2019), 200 mg (09/22/2019) fosaprepitant (EMEND) 150 mg, dexamethasone (DECADRON) 12 mg in sodium chloride 0.9 % 145 mL IVPB, , Intravenous,  Once, 4 of 4 cycles Administration:  (08/11/2019),  (09/01/2019),  (09/22/2019),  (12/02/2019)  for chemotherapy treatment.    08/26/2019 Genetic Testing   Negative genetic testing. VUS in CHEK2 called c.556A>C identified on the [facility] Common Hereditary Cancers Panel. The Common Hereditary Cancers Panel offered by [facility] includes sequencing and/or deletion duplication testing of the following 48 genes: APC, ATM, AXIN2, BARD1, BMPR1A, BRCA1, BRCA2, BRIP1, CDH1, CDKN2A (p14ARF), CDKN2A (p16INK4a), CKD4, CHEK2, CTNNA1, DICER1, EPCAM (Deletion/duplication testing only), GREM1 (promoter region deletion/duplication testing only), KIT, MEN1, MLH1, MSH2, MSH3, MSH6, MUTYH, NBN, NF1, NHTL1, PALB2, PDGFRA, PMS2, POLD1, POLE, PTEN, RAD50, RAD51C, RAD51D, RNF43, SDHB, SDHC, SDHD, SMAD4, SMARCA4. STK11, TP53, TSC1, TSC2, and VHL.  The following genes were evaluated for sequence changes only: SDHA and HOXB13 c.251G>A variant only. The report date is 08/26/2019.    10/21/2019 Cancer Staging  Staging form: Breast,  AJCC 8th Edition - Pathologic stage from 10/21/2019: Stage IIIC (pT2, pN2a, cM0, G3, ER-, PR-, HER2-) - Signed by Creig Hines, MD on 10/27/2019     Interval history-patient is a 64 year old female with past medical history significant for hyperlipidemia, anxiety and depression, neuralgia, and bilateral breast cancer status post double mastectomy followed by chemo radiation, currently on Femara and followed by Dr. Smith Robert, who presents to symptom management clinic for complaints of flank pain and pelvic pressure. Rates pain 4/10. Associated symptoms: Fatigue, intermittent headaches, weight gain, chronic constipation.  She was seen in symptom management clinic with similar complaints of bilateral flank pain and pelvic pressure and one episode of dysuria.  No history of recurrent UTIs but she was treated presumptively with Cipro.  Today, she says symptoms have not improved her flank pain and pelvic pressure.  No additional symptoms of dysuria.  She says she just feels "off" when she is concerned that her cancer is back.  ECOG FS:1 - Symptomatic but completely ambulatory  Review of systems- Review of Systems  Constitutional: Positive for malaise/fatigue. Negative for chills, fever and weight loss (weight gain).  HENT: Negative for hearing loss, nosebleeds, sore throat and tinnitus.   Eyes: Negative for blurred vision and double vision.  Respiratory: Positive for shortness of breath (hx of mitral valve prolapse- stable). Negative for cough, hemoptysis and wheezing.   Cardiovascular: Negative for chest pain, palpitations and leg swelling.  Gastrointestinal: Positive for abdominal pain (pelvic pain), constipation and diarrhea. Negative for blood in stool, melena, nausea and vomiting.  Genitourinary: Positive for flank pain (bilateral). Negative for dysuria and urgency.  Musculoskeletal: Positive for joint pain and myalgias. Negative for back pain and falls.  Skin: Negative for itching and rash.    Neurological: Negative for dizziness, tingling, sensory change, loss of consciousness, weakness and headaches.  Endo/Heme/Allergies: Negative for environmental allergies. Does not bruise/bleed easily.  Psychiatric/Behavioral: Negative for depression. The patient is nervous/anxious. The patient does not have insomnia.      Current treatment- Femara  Allergies  Allergen Reactions  . Amitiza [Lubiprostone] Other (See Comments)    Overly effective at higher dose  . Bentyl [Dicyclomine Hcl]     Lack of effect  . Celecoxib     REACTION: unspecified  . Cortisone Other (See Comments)    flush  . Mobic [Meloxicam]     Palpitations.  But can tolerate aleve  . Nitrofurantoin Monohyd Macro   . Sulfonamide Derivatives Nausea Only  . Zoloft [Sertraline Hcl] Diarrhea and Nausea Only  . Buspar [Buspirone] Palpitations  . Doxycycline Palpitations    chest pain  . Penicillins Rash    Past Medical History:  Diagnosis Date  . Anemia    d/t chemo - last Hgb 10.8  . Anxiety    sees Dr. Nolen Mu  . Breast cancer (HCC) 2020  . Cyst of breast    per Dr. Lemar Livings  . Dysrhythmia    Hx of palpitations  . Family history of breast cancer   . Family history of colon cancer   . Family history of leukemia   . Family history of ovarian cancer   . Fibromyalgia   . GERD (gastroesophageal reflux disease)   . Heart murmur   . Hemorrhoids   . Herpes, genital 04/2015   confirmed with HSV 2 IgG  . Hiatal hernia   . Hypercholesterolemia    Dr. Rushie Goltz  . Hyperlipidemia   . IBS (irritable bowel syndrome)  constipation predominant  . IC (interstitial cystitis)    per [facility]  . Mild depression (HCC)   . MVP (mitral valve prolapse)    [facility] cards eval Oct 20, 2011  . Osteopenia    2010/2017, DEXA at [facility];spine and fem neck  . PONV (postoperative nausea and vomiting)   . Vitamin D deficiency    history of    Past Surgical History:  Procedure Laterality Date  . ABDOMINAL HYSTERECTOMY    .  APPENDECTOMY    . BLADDER SURGERY     1980's  . BREAST EXCISIONAL BIOPSY Right    benign  . BREAST EXCISIONAL BIOPSY Bilateral    benign  . COLONOSCOPY  19-Oct-2014   Dr. Mechele Collin  . COLONOSCOPY  2008-10-19   at [facility] 1 POLYP (BENIGN)  . excision of breast cysts     hx of multiple cyst aspirations  . EXPLORATORY LAPAROTOMY    . MASTECTOMY MODIFIED RADICAL Bilateral 10/09/2019   Procedure: RIGHT MASTECTOMY MODIFIED RADICAL AND LEFT TOTAL MASTECTOMY;  Surgeon: Emelia Loron, MD;  Location: [facility] OR;  Service: General;  Laterality: Bilateral;  . PORTA CATH INSERTION N/A 08/06/2019   Procedure: PORTA CATH INSERTION;  Surgeon: Renford Dills, MD;  Location: [facility] INVASIVE CV LAB;  Service: Cardiovascular;  Laterality: N/A;  . PORTACATH PLACEMENT Left 08/05/2019   Procedure: INSERTION PORT-A-CATH, Attempted;  Surgeon: Leafy Ro, MD;  Location: [facility] ORS;  Service: General;  Laterality: Left;  . TUBAL LIGATION      Social History   Socioeconomic History  . Marital status: Divorced    Spouse name: Not on file  . Number of children: 1  . Years of education: Not on file  . Highest education level: Not on file  Occupational History  . Occupation: [employer]  Tobacco Use  . Smoking status: Never Smoker  . Smokeless tobacco: Never Used  Vaping Use  . Vaping Use: Never used  Substance and Sexual Activity  . Alcohol use: No    Alcohol/week: 0.0 standard drinks  . Drug use: No  . Sexual activity: Yes    Birth control/protection: Surgical    Comment: Hysterectomy  Other Topics Concern  . Not on file  Social History Narrative   Married in 10/19/1997, divorced as of October 19, 2017, 1 son from previous relationship   Brother with MVA at 55, in rest home for many years as of 10-19-18- died of covid recently   Social Determinants of Corporate investment banker Strain: Low Risk   . Difficulty of Paying Living Expenses: Not hard at all  Food Insecurity: No Food Insecurity  . Worried About Programme researcher, broadcasting/film/video in the  Last Year: Never true  . Ran Out of Food in the Last Year: Never true  Transportation Needs: No Transportation Needs  . Lack of Transportation (Medical): No  . Lack of Transportation (Non-Medical): No  Physical Activity: Inactive  . Days of Exercise per Week: 0 days  . Minutes of Exercise per Session: 0 min  Stress: No Stress Concern Present  . Feeling of Stress : Not at all  Social Connections:   . Frequency of Communication with Friends and Family:   . Frequency of Social Gatherings with Friends and Family:   . Attends Religious Services:   . Active Member of Clubs or Organizations:   . Attends Banker Meetings:   [name] Marital Status:   Intimate Partner Violence: Not At Risk  . Fear of Current or Ex-Partner: No  . Emotionally  Abused: No  . Physically Abused: No  . Sexually Abused: No    Family History  Problem Relation Age of Onset  . Breast cancer Mother 50  . Hypertension Mother   . Colon cancer Father 46  . Hyperlipidemia Father   . Ovarian cancer Maternal Aunt 80  . Breast cancer Cousin   . Gallbladder disease Paternal Grandmother   . Liver cancer Cousin 70       Malignant  . Cancer Maternal Uncle        unsure type     Current Outpatient Medications:  .  acetaminophen (TYLENOL) 650 MG CR tablet, Take 650 mg by mouth every 8 (eight) hours as needed for pain., Disp: , Rfl:  .  Ascorbic Acid (VITAMIN C) 100 MG tablet, Take 100 mg by mouth daily., Disp: , Rfl:  .  atorvastatin (LIPITOR) 40 MG tablet, Take 40 mg by mouth at bedtime. , Disp: , Rfl: 1 .  Calcium Carbonate-Vit D-Min (CALCIUM 1200 PO), Take 4 tablets by mouth daily., Disp: , Rfl:  .  cholecalciferol (VITAMIN D3) 25 MCG (1000 UNIT) tablet, Take 1 tablet (1,000 Units total) by mouth daily., Disp: , Rfl:  .  clonazePAM (KLONOPIN) 0.5 MG tablet, Take 0.5 mg by mouth 2 (two) times daily. , Disp: , Rfl:  .  DULoxetine (CYMBALTA) 60 MG capsule, Take 60 mg by mouth daily. , Disp: , Rfl:  .  gabapentin  (NEURONTIN) 100 MG capsule, Take 1-2 capsules (100-200 mg total) by mouth 3 (three) times daily as needed., Disp: 90 capsule, Rfl: 1 .  lidocaine-prilocaine (EMLA) cream, APPLY TO AFFECTED AREAS ONCE AS DIRECTED, Disp: 30 g, Rfl: 3 .  loratadine (CLARITIN) 10 MG tablet, Take 1 tablet (10 mg total) by mouth daily., Disp: , Rfl:  .  omeprazole (PRILOSEC) 20 MG capsule, Take 20 mg by mouth daily., Disp: , Rfl:  .  Plecanatide (TRULANCE) 3 MG TABS, Take 3 mg by mouth daily. , Disp: , Rfl:  .  sennosides-docusate sodium (SENOKOT-S) 8.6-50 MG tablet, Take 1-2 tablets by mouth at bedtime. , Disp: , Rfl:  .  sulfamethoxazole-trimethoprim (BACTRIM DS) 800-160 MG tablet, Take 1 tablet by mouth 2 (two) times daily., Disp: 10 tablet, Rfl: 0 .  valACYclovir (VALTREX) 500 MG tablet, TAKE 1 TABLET BY MOUTH DAILY FOR 3 DAYS AS NEEDED AS DIRECTED, Disp: 90 tablet, Rfl: 0 .  vitamin B-12 (CYANOCOBALAMIN) 500 MCG tablet, Take 500 mcg by mouth daily., Disp: , Rfl:  .  fluconazole (DIFLUCAN) 200 MG tablet, Take 1 tablet (200 mg total) by mouth daily. (Patient not taking: Reported on 07/01/2020), Disp: 2 tablet, Rfl: 0 .  furosemide (LASIX) 20 MG tablet, Take 1 tablet (20 mg total) by mouth every other day. (Patient not taking: Reported on 05/17/2020), Disp: , Rfl:  .  gentamicin ointment (GARAMYCIN) 0.1 %, Apply 1 application topically in the morning, at noon, and at bedtime. (Patient not taking: Reported on 05/17/2020), Disp: , Rfl:  .  HYDROcodone-acetaminophen (NORCO/VICODIN) 5-325 MG tablet, Take 1 tablet by mouth every 6 (six) hours as needed for moderate pain. (Patient not taking: Reported on 04/20/2020), Disp: 15 tablet, Rfl: 0 .  letrozole (FEMARA) 2.5 MG tablet, Take 1 tablet (2.5 mg total) by mouth daily. (Patient not taking: Reported on 05/17/2020), Disp: 30 tablet, Rfl: 3 .  magic mouthwash w/lidocaine SOLN, Take 5 mLs by mouth 4 (four) times daily. 80 ml viscous lidocaine 2%, 80 ml Mylanta, 80 ml Diphenhydramine  12.5 mg/5 ml  Elixir, 80 ml Nystatin 100,000 Unit suspension, 80 ml Prednisolone 15 mg/58ml, 80 ml Distilled Water. Sig: Swish/Swallow 5-10 ml four times a day as needed. Dispense 480 ml. 3RFs (Patient not taking: Reported on 05/17/2020), Disp: 480 mL, Rfl: 3 .  methocarbamol (ROBAXIN) 500 MG tablet, Take 1 tablet (500 mg total) by mouth every 6 (six) hours as needed for muscle spasms. (Patient not taking: Reported on 07/01/2020), Disp: 30 tablet, Rfl: 1 .  ondansetron (ZOFRAN) 8 MG tablet, Take 1 tablet (8 mg total) by mouth 2 (two) times daily as needed. Start on the third day after chemotherapy. (Patient not taking: Reported on 05/17/2020), Disp: 30 tablet, Rfl: 1 .  prochlorperazine (COMPAZINE) 10 MG tablet, Take 1 tablet (10 mg total) by mouth every 6 (six) hours as needed (Nausea or vomiting). (Patient not taking: Reported on 04/20/2020), Disp: 30 tablet, Rfl: 1 .  sucralfate (CARAFATE) 1 g tablet, Take 1 g by mouth 3 (three) times daily. (Patient not taking: Reported on 07/01/2020), Disp: , Rfl:   Physical exam:  Vitals:   07/01/20 1355  BP: (!) 127/59  Pulse: 90  Resp: 18  Temp: 97.6 F (36.4 C)  TempSrc: Tympanic  Weight: 156 lb 12.8 oz (71.1 kg)   Physical Exam Constitutional:      General: She is not in acute distress.    Appearance: She is well-developed. She is not ill-appearing.  HENT:     Head: Atraumatic.     Mouth/Throat:     Pharynx: No oropharyngeal exudate.  Eyes:     General: No scleral icterus.    Conjunctiva/sclera: Conjunctivae normal.  Cardiovascular:     Rate and Rhythm: Normal rate and regular rhythm.  Pulmonary:     Effort: Pulmonary effort is normal. No respiratory distress.     Breath sounds: Normal breath sounds. No wheezing.  Abdominal:     General: Bowel sounds are normal. There is no distension.     Palpations: Abdomen is soft. There is no mass.     Tenderness: There is no abdominal tenderness. There is no right CVA tenderness, left CVA tenderness,  guarding or rebound.  Musculoskeletal:        General: Normal range of motion.     Cervical back: Normal range of motion and neck supple.  Skin:    General: Skin is warm and dry.  Neurological:     Mental Status: She is alert and oriented to person, place, and time.  Psychiatric:        Mood and Affect: Mood is anxious.      CMP Latest Ref Rng & Units 06/23/2020  Glucose 70 - 99 mg/dL 626(R)  BUN 8 - 23 mg/dL 12  Creatinine 4.85 - 4.62 mg/dL 7.03  Sodium 500 - 938 mmol/L 137  Potassium 3.5 - 5.1 mmol/L 3.8  Chloride 98 - 111 mmol/L 104  CO2 22 - 32 mmol/L 27  Calcium 8.9 - 10.3 mg/dL 1.8(E)  Total Protein 6.5 - 8.1 g/dL 6.6  Total Bilirubin 0.3 - 1.2 mg/dL 0.4  Alkaline Phos 38 - 126 U/L 66  AST 15 - 41 U/L 21  ALT 0 - 44 U/L 16   CBC Latest Ref Rng & Units 06/23/2020  WBC 4.0 - 10.5 K/uL 6.9  Hemoglobin 12.0 - 15.0 g/dL 11.7(L)  Hematocrit 36 - 46 % 35.7(L)  Platelets 150 - 400 K/uL 210    No images are attached to the encounter.  No results found.  Assessment and plan- Patient is  a 64 y.o. female with history of breast cancer who presents to symptom management clinic for complaints of flank & pelvic pain and bloating and malaise.    Etiology unclear. No improvement with treating for UTI. Urine culture grew 30,000 colonies of klebsiella sensitive to ciprofloxacin. CBC and CMP from 06/23/20 reviewed and nothing noted that would account for her symptoms. Mild normocytic anemia though improved, hemoglobin 11.7. Symptoms may be related to femara. Exam reassuring. In setting of hx of stage IIIb breast cancer, recommend CT chest-abdomen-pelvis to further evaluate.  Patient agreeable.  Discussed results of CT imaging with patient.  She requests referral to Dr. Servando Snare with GI.  She feels it may be time for her colonoscopy.  Additionally, she has history of IBS with alternating constipation and diarrhea.  She was previously seen by Dr. Mechele Collin who has since retired.  We will send  referral today.  Follow-up with Dr. Smith Robert as scheduled.  Return to clinic if symptoms do not improve or worsen in the interim.  Visit Diagnosis 1. HX: breast cancer   2. Pelvic pressure in female   3. Bloating symptom   4. Flank pain     Patient expressed understanding and was in agreement with this plan. She also understands that She can call clinic at any time with any questions, concerns, or complaints.   Thank you for allowing me to participate in the care of this very pleasant patient.   Consuello Masse, DNP, AGNP-C [facility] [phone]  CC: Dr. Smith Robert

## 2020-07-07 ENCOUNTER — Ambulatory Visit
Admission: RE | Admit: 2020-07-07 | Discharge: 2020-07-07 | Disposition: A | Discharge status: Home / Self Care | Payer: PPO | Source: Ambulatory Visit | Attending: Nurse Practitioner | Admitting: Nurse Practitioner

## 2020-07-07 ENCOUNTER — Other Ambulatory Visit: Payer: Self-pay

## 2020-07-07 DIAGNOSIS — Z853 Personal history of malignant neoplasm of breast: Secondary | ICD-10-CM | POA: Diagnosis not present

## 2020-07-07 DIAGNOSIS — R102 Pelvic and perineal pain: Secondary | ICD-10-CM | POA: Diagnosis not present

## 2020-07-07 DIAGNOSIS — R140 Abdominal distension (gaseous): Secondary | ICD-10-CM

## 2020-07-07 DIAGNOSIS — Z90710 Acquired absence of both cervix and uterus: Secondary | ICD-10-CM | POA: Diagnosis not present

## 2020-07-07 DIAGNOSIS — K760 Fatty (change of) liver, not elsewhere classified: Secondary | ICD-10-CM | POA: Diagnosis not present

## 2020-07-07 DIAGNOSIS — R918 Other nonspecific abnormal finding of lung field: Secondary | ICD-10-CM | POA: Diagnosis not present

## 2020-07-07 MED ORDER — IOHEXOL 300 MG/ML  SOLN
100.0000 mL | Freq: Once | INTRAMUSCULAR | Status: AC | PRN
  Administered 2020-07-07: 100 mL via INTRAVENOUS

## 2020-07-08 ENCOUNTER — Other Ambulatory Visit: Payer: Self-pay | Admitting: Obstetrics and Gynecology

## 2020-07-08 MED ORDER — VALACYCLOVIR HCL 500 MG PO TABS: 500.0000 mg | ORAL_TABLET | Freq: Every day | ORAL | 0 refills | Status: DC

## 2020-07-08 NOTE — Progress Notes (Signed)
Rx RF valtrex daily for HSV. Can have PCP manage since not seeing anymore.

## 2020-07-09 DIAGNOSIS — R600 Localized edema: Secondary | ICD-10-CM | POA: Diagnosis not present

## 2020-07-09 DIAGNOSIS — E782 Mixed hyperlipidemia: Secondary | ICD-10-CM | POA: Diagnosis not present

## 2020-07-09 DIAGNOSIS — I341 Nonrheumatic mitral (valve) prolapse: Secondary | ICD-10-CM | POA: Diagnosis not present

## 2020-07-09 DIAGNOSIS — R002 Palpitations: Secondary | ICD-10-CM | POA: Diagnosis not present

## 2020-07-12 ENCOUNTER — Encounter: Payer: Self-pay | Admitting: Gastroenterology

## 2020-07-12 ENCOUNTER — Ambulatory Visit: Payer: PPO | Admitting: Gastroenterology

## 2020-07-12 ENCOUNTER — Other Ambulatory Visit: Payer: Self-pay

## 2020-07-12 VITALS — BP 117/65 | HR 83 | Ht 67.0 in | Wt 156.0 lb

## 2020-07-12 DIAGNOSIS — K581 Irritable bowel syndrome with constipation: Secondary | ICD-10-CM | POA: Diagnosis not present

## 2020-07-12 DIAGNOSIS — K219 Gastro-esophageal reflux disease without esophagitis: Secondary | ICD-10-CM

## 2020-07-12 NOTE — Progress Notes (Signed)
Gastroenterology Consultation  Referring Provider:     Tonia Ghent, MD Primary Care Physician:  Tonia Ghent, MD Primary Gastroenterologist:  Dr. Allen Norris     Reason for Consultation:     Bloating        HPI:   Debra Little is a 64 y.o. y/o female referred for consultation & management of bloating by Dr. Damita Dunnings, Elveria Rising, MD.  This patient comes in today with a history of bloating.  The patient had been seen multiple times between 2018 and 2020 at the [facility].  The patient's last interaction there was in December 2020.  The patient has also had multiple colonoscopies with them including in 2010, 2015 and in 2018.  The patient was followed there for chronic constipation and GERD. The patient reports that at her last colonoscopy she was told to have a repeat colonoscopy in 5 years.  She now reports that she has a feeling of bloating and she feels that it is water retention.  She has been diagnosed and treated for breast cancer with a double mastectomy.  There is no report of any unexplained weight loss fevers chills nausea or vomiting.  She is now on Trulance and Senokot every day to help move her bowels.  The patient also reports that her father had colon cancer. She is now here to transfer care to me since her gastroenterologist retired.   Past Medical History:  Diagnosis Date  . Anemia    d/t chemo - last Hgb 10.8  . Anxiety    sees Dr. [name]  . Breast cancer ([place]) 2020  . Cyst of breast    per [name]  . Dysrhythmia    Hx of palpitations  . Family history of breast cancer   . Family history of colon cancer   . Family history of leukemia   . Family history of ovarian cancer   . Fibromyalgia   . GERD (gastroesophageal reflux disease)   . Heart murmur   . Hemorrhoids   . Herpes, genital 04/2015   confirmed with HSV 2 IgG  . Hiatal hernia   . Hypercholesterolemia    Dr. Kyra Searles  . Hyperlipidemia   . IBS (irritable bowel syndrome)    constipation  predominant  . IC (interstitial cystitis)    per [facility]  . Mild depression ([place])   . MVP (mitral valve prolapse)    [facility] cards eval 2012  . Osteopenia    2010/2017, DEXA at [facility];spine and fem neck  . PONV (postoperative nausea and vomiting)   . Vitamin D deficiency    history of    Past Surgical History:  Procedure Laterality Date  . ABDOMINAL HYSTERECTOMY    . APPENDECTOMY    . BLADDER SURGERY     1980's  . BREAST EXCISIONAL BIOPSY Right    benign  . BREAST EXCISIONAL BIOPSY Bilateral    benign  . COLONOSCOPY  09/2014   Dr. [name]  . COLONOSCOPY  2009   at [facility] 1 POLYP (BENIGN)  . excision of breast cysts     hx of multiple cyst aspirations  . EXPLORATORY LAPAROTOMY    . MASTECTOMY MODIFIED RADICAL Bilateral 10/09/2019   Procedure: RIGHT MASTECTOMY MODIFIED RADICAL AND LEFT TOTAL MASTECTOMY;  Surgeon: Rolm Bookbinder, MD;  Location: [facility];  Service: General;  Laterality: Bilateral;  . PORTA CATH INSERTION N/A 08/06/2019   Procedure: PORTA CATH INSERTION;  Surgeon: Katha Cabal, MD;  Location: [facility] INVASIVE CV  LAB;  Service: Cardiovascular;  Laterality: N/A;  . PORTACATH PLACEMENT Left 08/05/2019   Procedure: INSERTION PORT-A-CATH, Attempted;  Surgeon: Jules Husbands, MD;  Location: [facility] ORS;  Service: General;  Laterality: Left;  . TUBAL LIGATION      Prior to Admission medications   Medication Sig Start Date End Date Taking? Authorizing Provider  acetaminophen (TYLENOL) 650 MG CR tablet Take 650 mg by mouth every 8 (eight) hours as needed for pain.    [provider]  Ascorbic Acid (VITAMIN C) 100 MG tablet Take 100 mg by mouth daily.    [provider]  atorvastatin (LIPITOR) 40 MG tablet Take 40 mg by mouth at bedtime.  01/16/17   [provider]  Calcium Carbonate-Vit D-Min (CALCIUM 1200 PO) Take 4 tablets by mouth daily.    [provider]  cholecalciferol (VITAMIN D3) 25 MCG (1000 UNIT) tablet Take 1 tablet (1,000  Units total) by mouth daily. 04/21/20   Tonia Ghent, MD  clonazePAM (KLONOPIN) 0.5 MG tablet Take 0.5 mg by mouth 2 (two) times daily.  01/30/17   [provider]  DULoxetine (CYMBALTA) 60 MG capsule Take 60 mg by mouth daily.     [provider]  fluconazole (DIFLUCAN) 200 MG tablet Take 1 tablet (200 mg total) by mouth daily. Patient not taking: Reported on 07/01/2020 06/23/20   Jacquelin Hawking, NP  furosemide (LASIX) 20 MG tablet Take 1 tablet (20 mg total) by mouth every other day. Patient not taking: Reported on 05/17/2020 04/20/20   Tonia Ghent, MD  gabapentin (NEURONTIN) 100 MG capsule Take 1-2 capsules (100-200 mg total) by mouth 3 (three) times daily as needed. 01/17/18   Tonia Ghent, MD  gentamicin ointment (GARAMYCIN) 0.1 % Apply 1 application topically in the morning, at noon, and at bedtime. Patient not taking: Reported on 05/17/2020 01/29/20   [provider]  HYDROcodone-acetaminophen (NORCO/VICODIN) 5-325 MG tablet Take 1 tablet by mouth every 6 (six) hours as needed for moderate pain. Patient not taking: Reported on 04/20/2020 08/05/19   Caroleen Hamman F, MD  letrozole [facility]) 2.5 MG tablet Take 1 tablet (2.5 mg total) by mouth daily. Patient not taking: Reported on 05/17/2020 03/02/20   Sindy Guadeloupe, MD  lidocaine-prilocaine (EMLA) cream APPLY TO AFFECTED AREAS ONCE AS DIRECTED 02/17/20   Sindy Guadeloupe, MD  loratadine (CLARITIN) 10 MG tablet Take 1 tablet (10 mg total) by mouth daily. 04/10/14   Tonia Ghent, MD  magic mouthwash w/lidocaine SOLN Take 5 mLs by mouth 4 (four) times daily. 80 ml viscous lidocaine 2%, 80 ml Mylanta, 80 ml Diphenhydramine 12.5 mg/5 ml Elixir, 80 ml Nystatin 100,000 Unit suspension, 80 ml Prednisolone 15 mg/17ml, 80 ml Distilled Water. Sig: Swish/Swallow 5-10 ml four times a day as needed. Dispense 480 ml. 3RFs Patient not taking: Reported on 05/17/2020 12/31/19   Sindy Guadeloupe, MD  methocarbamol (ROBAXIN) 500 MG tablet Take  1 tablet (500 mg total) by mouth every 6 (six) hours as needed for muscle spasms. Patient not taking: Reported on 07/01/2020 10/10/19   Rolm Bookbinder, MD  omeprazole (PRILOSEC) 20 MG capsule Take 20 mg by mouth daily.    [provider]  ondansetron (ZOFRAN) 8 MG tablet Take 1 tablet (8 mg total) by mouth 2 (two) times daily as needed. Start on the third day after chemotherapy. Patient not taking: Reported on 05/17/2020 08/07/19   Sindy Guadeloupe, MD  Plecanatide (TRULANCE) 3 MG TABS Take 3  mg by mouth daily.     [provider]  prochlorperazine (COMPAZINE) 10 MG tablet Take 1 tablet (10 mg total) by mouth every 6 (six) hours as needed (Nausea or vomiting). Patient not taking: Reported on 04/20/2020 08/07/19   Sindy Guadeloupe, MD  sennosides-docusate sodium (SENOKOT-S) 8.6-50 MG tablet Take 1-2 tablets by mouth at bedtime.     [provider]  sucralfate (CARAFATE) 1 g tablet Take 1 g by mouth 3 (three) times daily. Patient not taking: Reported on 07/01/2020 11/12/19   [provider]  sulfamethoxazole-trimethoprim (BACTRIM DS) 800-160 MG tablet Take 1 tablet by mouth 2 (two) times daily. 06/25/20   Jacquelin Hawking, NP  valACYclovir (VALTREX) 500 MG tablet Take 1 tablet (500 mg total) by mouth daily. 02/23/06   Copland, Deirdre Evener, PA-C  vitamin B-12 (CYANOCOBALAMIN) 500 MCG tablet Take 500 mcg by mouth daily.    [provider]    Family History  Problem Relation Age of Onset  . Breast cancer Mother 16  . Hypertension Mother   . Colon cancer Father 19  . Hyperlipidemia Father   . Ovarian cancer Maternal Aunt 80  . Breast cancer Cousin   . Gallbladder disease Paternal Grandmother   . Liver cancer Cousin 70       Malignant  . Cancer Maternal Uncle        unsure type     Social History   Tobacco Use  . Smoking status: Never Smoker  . Smokeless tobacco: Never Used  Vaping Use  . Vaping Use: Never used  Substance Use Topics  . Alcohol use: No      Alcohol/week: 0.0 standard drinks  . Drug use: No    Allergies as of 07/12/2020 - Review Complete 07/07/2020  Allergen Reaction Noted  . Amitiza [lubiprostone] Other (See Comments) 06/25/2018  . Bentyl [dicyclomine hcl]  05/28/2012  . Celecoxib  07/30/2007  . Cortisone Other (See Comments) 10/06/2014  . Mobic [meloxicam]  12/19/2013  . Nitrofurantoin monohyd macro  02/07/2012  . Sulfonamide derivatives Nausea Only 07/30/2007  . Zoloft [sertraline hcl] Diarrhea and Nausea Only 08/20/2012  . Buspar [buspirone] Palpitations 11/14/2017  . Doxycycline Palpitations 02/26/2012  . Penicillins Rash 07/30/2007    Review of Systems:    All systems reviewed and negative except where noted in HPI.   Physical Exam:  There were no vitals taken for this visit. No LMP recorded. Patient has had a hysterectomy. General:   Alert,  Well-developed, well-nourished, pleasant and cooperative in NAD Head:  Normocephalic and atraumatic. Eyes:  Sclera clear, no icterus.   Conjunctiva pink. Ears:  Normal auditory acuity. Neck:  Supple; no masses or thyromegaly. Lungs:  Respirations even and unlabored.  Clear throughout to auscultation.   No wheezes, crackles, or rhonchi. No acute distress. Heart:  Regular rate and rhythm; no murmurs, clicks, rubs, or gallops. Abdomen:  Normal bowel sounds.  No bruits.  Soft, non-tender and non-distended without masses, hepatosplenomegaly or hernias noted.  No guarding or rebound tenderness.  Negative Carnett sign.   Rectal:  Deferred.  Pulses:  Normal pulses noted. Extremities:  No clubbing or edema.  No cyanosis. Neurologic:  Alert and oriented x3;  grossly normal neurologically. Skin:  Intact without significant lesions or rashes.  No jaundice. Lymph Nodes:  No significant cervical adenopathy. Psych:  Alert and cooperative. Normal mood and affect.  Imaging Studies: CT CHEST ABDOMEN PELVIS W CONTRAST  Result Date: 07/08/2020 CLINICAL DATA:  Breast carcinoma.  Status post  chemotherapy and radiation therapy. Abdominal and pelvic pain and bloating. EXAM: CT CHEST, ABDOMEN, AND PELVIS WITH CONTRAST TECHNIQUE: Multidetector CT imaging of the chest, abdomen and pelvis was performed following the standard protocol during bolus administration of intravenous contrast. CONTRAST:  122mL OMNIPAQUE IOHEXOL 300 MG/ML  SOLN COMPARISON:  02/25/2020 FINDINGS: CT CHEST FINDINGS Cardiovascular: No acute findings. Mediastinum/Lymph Nodes: No masses or pathologically enlarged lymph nodes identified. Lungs/Pleura: No suspicious pulmonary nodules or masses are identified. New ill-defined subpleural opacity is seen in the anterolateral right middle lobe which may be due to radiation pneumonitis or other infectious or inflammatory etiology. No evidence of pleural effusion. Musculoskeletal:  No suspicious bone lesions identified. CT ABDOMEN AND PELVIS FINDINGS Hepatobiliary: No masses identified. Hepatic steatosis is again noted. Gallbladder is unremarkable. No evidence of biliary ductal dilatation. Pancreas:  No mass or inflammatory changes. Spleen:  Within normal limits in size and appearance. Adrenals/Urinary tract:  No masses or hydronephrosis. Stomach/Bowel: No evidence of obstruction, inflammatory process, or abnormal fluid collections. Vascular/Lymphatic: No pathologically enlarged lymph nodes identified. No abdominal aortic aneurysm. Reproductive: Prior hysterectomy noted. Adnexal regions are unremarkable in appearance. Other:  None. Musculoskeletal:  No suspicious bone lesions identified. IMPRESSION: No evidence of recurrent or metastatic carcinoma within the chest, abdomen, or pelvis. Mild peripheral infiltrate in right middle lobe, which may be due to radiation pneumonitis or other infectious or inflammatory etiology. Stable hepatic steatosis. Electronically Signed   By: Marlaine Hind M.D.   On: 07/08/2020 08:15    Assessment and Plan:   Debra Little is a 64 y.o. y/o female  who comes in today to transfer her care.  The patient has been doing well on her Trulance and Senokot as long as she takes it every day.  There is no report of any unexplained weight loss dysphagia fevers chills nausea or vomiting.  The patient has been told that since her symptoms started after her mastectomy it may be a change in her bowel flora from the antibiotic she had received around that time.  With her feeling better when she moves her bowels and her bloating with constipation she likely has IBS-C.  The patient has been told to start a probiotic.  She will let me know how her symptoms go.  The patient will also be set up for repeat colonoscopy in 2 years.  The patient has been explained the plan agrees with it.    Lucilla Lame, MD. Mar[facility] Regal    Note: This dictation was prepared with Dragon dictation along with smaller phrase technology. Any transcriptional errors that result from this process are unintentional.

## 2020-07-15 DIAGNOSIS — F330 Major depressive disorder, recurrent, mild: Secondary | ICD-10-CM | POA: Diagnosis not present

## 2020-07-15 DIAGNOSIS — F410 Panic disorder [episodic paroxysmal anxiety] without agoraphobia: Secondary | ICD-10-CM | POA: Diagnosis not present

## 2020-07-15 DIAGNOSIS — F411 Generalized anxiety disorder: Secondary | ICD-10-CM | POA: Diagnosis not present

## 2020-07-22 ENCOUNTER — Telehealth: Payer: Self-pay | Admitting: Gastroenterology

## 2020-07-22 ENCOUNTER — Other Ambulatory Visit: Payer: Self-pay

## 2020-07-22 DIAGNOSIS — R350 Frequency of micturition: Secondary | ICD-10-CM | POA: Diagnosis not present

## 2020-07-22 MED ORDER — DEXLANSOPRAZOLE 30 MG PO CPDR: 30.0000 mg | DELAYED_RELEASE_CAPSULE | Freq: Every day | ORAL | 6 refills | Status: DC

## 2020-07-22 NOTE — Telephone Encounter (Signed)
Prescription for Dexilant 30mg  has been sent to pt's pharmacy. Pt notified via mychart this was completed.

## 2020-07-22 NOTE — Telephone Encounter (Signed)
Patient calling to state the samples of medication dexilant 30mg  did seem to work and she would now like a prescription sent into [facility].

## 2020-07-26 ENCOUNTER — Other Ambulatory Visit: Payer: Self-pay

## 2020-07-26 ENCOUNTER — Inpatient Hospital Stay: Payer: PPO

## 2020-07-26 ENCOUNTER — Telehealth: Payer: Self-pay | Admitting: *Deleted

## 2020-07-26 DIAGNOSIS — R309 Painful micturition, unspecified: Secondary | ICD-10-CM

## 2020-07-26 DIAGNOSIS — Z853 Personal history of malignant neoplasm of breast: Secondary | ICD-10-CM | POA: Diagnosis not present

## 2020-07-26 DIAGNOSIS — R300 Dysuria: Secondary | ICD-10-CM

## 2020-07-26 LAB — URINALYSIS, COMPLETE (UACMP) WITH MICROSCOPIC
Bilirubin Urine: NEGATIVE
Glucose, UA: NEGATIVE mg/dL
Hgb urine dipstick: NEGATIVE
Ketones, ur: NEGATIVE mg/dL
Nitrite: NEGATIVE
Protein, ur: NEGATIVE mg/dL
Specific Gravity, Urine: 1.002 — ABNORMAL LOW (ref 1.005–1.030)
WBC, UA: >50 WBC/hpf — ABNORMAL HIGH (ref 0–5)
pH: 6 (ref 5.0–8.0)

## 2020-07-26 NOTE — Telephone Encounter (Signed)
Patient called reporting that she is having pain and burning with urination and that her PCP cannot see her today and she wants to know if we would check her urine for her. Please advise

## 2020-07-26 NOTE — Telephone Encounter (Signed)
Yes she can drop off urine sample for UA and urine cx. Can you put the order in?

## 2020-07-26 NOTE — Telephone Encounter (Signed)
Patient agrees tococme in now to give urine sample. Lab informed, orders entered, appointment scheduled.

## 2020-07-27 ENCOUNTER — Telehealth: Payer: Self-pay

## 2020-07-27 DIAGNOSIS — R5383 Other fatigue: Secondary | ICD-10-CM | POA: Diagnosis not present

## 2020-07-27 DIAGNOSIS — E782 Mixed hyperlipidemia: Secondary | ICD-10-CM | POA: Diagnosis not present

## 2020-07-27 LAB — URINE CULTURE: Culture: <10000 — AB

## 2020-07-27 MED ORDER — CIPROFLOXACIN HCL 500 MG PO TABS: 500.0000 mg | ORAL_TABLET | Freq: Two times a day (BID) | ORAL | 0 refills | Status: DC

## 2020-07-27 NOTE — Telephone Encounter (Signed)
Called pt and let her know the results of the urinalysis, sent RX to [facility] as requested by patient.

## 2020-07-27 NOTE — Telephone Encounter (Signed)
-----   Message from Sindy Guadeloupe, MD sent at 07/26/2020  4:02 PM EDT ----- Please send her a prescription for cipro 500 mg BID for 7 days

## 2020-07-29 ENCOUNTER — Encounter: Payer: Self-pay | Admitting: Oncology

## 2020-07-29 ENCOUNTER — Other Ambulatory Visit: Payer: Self-pay | Admitting: *Deleted

## 2020-07-29 MED ORDER — FLUCONAZOLE 200 MG PO TABS
200.0000 mg | ORAL_TABLET | Freq: Every day | ORAL | 0 refills | Status: DC
Start: 2020-07-29 — End: 2020-08-10

## 2020-07-29 NOTE — Telephone Encounter (Signed)
200 mg diflucan 1 dose

## 2020-08-03 ENCOUNTER — Telehealth: Payer: Self-pay

## 2020-08-03 NOTE — Telephone Encounter (Signed)
Spoke with patient, she wanted to reschedule her CARE appointment to 9/21. Scheduled her first day and walk test for 9/21.

## 2020-08-09 ENCOUNTER — Other Ambulatory Visit: Payer: PPO

## 2020-08-09 ENCOUNTER — Ambulatory Visit: Payer: PPO | Admitting: Oncology

## 2020-08-10 ENCOUNTER — Other Ambulatory Visit: Payer: Self-pay

## 2020-08-10 ENCOUNTER — Inpatient Hospital Stay (HOSPITAL_BASED_OUTPATIENT_CLINIC_OR_DEPARTMENT_OTHER): Payer: PPO | Admitting: Oncology

## 2020-08-10 ENCOUNTER — Encounter: Payer: Self-pay | Admitting: *Deleted

## 2020-08-10 ENCOUNTER — Other Ambulatory Visit: Payer: Self-pay | Admitting: *Deleted

## 2020-08-10 ENCOUNTER — Encounter: Payer: Self-pay | Admitting: Oncology

## 2020-08-10 ENCOUNTER — Inpatient Hospital Stay: Payer: PPO | Attending: Oncology

## 2020-08-10 VITALS — BP 120/70 | HR 73 | Temp 98.5°F | Resp 16 | Ht 67.0 in | Wt 155.1 lb

## 2020-08-10 DIAGNOSIS — Z803 Family history of malignant neoplasm of breast: Secondary | ICD-10-CM | POA: Diagnosis not present

## 2020-08-10 DIAGNOSIS — Z8041 Family history of malignant neoplasm of ovary: Secondary | ICD-10-CM | POA: Insufficient documentation

## 2020-08-10 DIAGNOSIS — Z8249 Family history of ischemic heart disease and other diseases of the circulatory system: Secondary | ICD-10-CM | POA: Insufficient documentation

## 2020-08-10 DIAGNOSIS — Z79899 Other long term (current) drug therapy: Secondary | ICD-10-CM | POA: Diagnosis not present

## 2020-08-10 DIAGNOSIS — Z9013 Acquired absence of bilateral breasts and nipples: Secondary | ICD-10-CM | POA: Diagnosis not present

## 2020-08-10 DIAGNOSIS — Z08 Encounter for follow-up examination after completed treatment for malignant neoplasm: Secondary | ICD-10-CM | POA: Diagnosis not present

## 2020-08-10 DIAGNOSIS — Z9221 Personal history of antineoplastic chemotherapy: Secondary | ICD-10-CM | POA: Diagnosis not present

## 2020-08-10 DIAGNOSIS — Z923 Personal history of irradiation: Secondary | ICD-10-CM | POA: Insufficient documentation

## 2020-08-10 DIAGNOSIS — Z8349 Family history of other endocrine, nutritional and metabolic diseases: Secondary | ICD-10-CM | POA: Diagnosis not present

## 2020-08-10 DIAGNOSIS — F419 Anxiety disorder, unspecified: Secondary | ICD-10-CM | POA: Insufficient documentation

## 2020-08-10 DIAGNOSIS — Z90710 Acquired absence of both cervix and uterus: Secondary | ICD-10-CM | POA: Insufficient documentation

## 2020-08-10 DIAGNOSIS — Z853 Personal history of malignant neoplasm of breast: Secondary | ICD-10-CM | POA: Diagnosis not present

## 2020-08-10 DIAGNOSIS — Z800 Family history of malignant neoplasm of digestive organs: Secondary | ICD-10-CM | POA: Diagnosis not present

## 2020-08-10 DIAGNOSIS — I972 Postmastectomy lymphedema syndrome: Secondary | ICD-10-CM | POA: Insufficient documentation

## 2020-08-10 DIAGNOSIS — C50511 Malignant neoplasm of lower-outer quadrant of right female breast: Secondary | ICD-10-CM

## 2020-08-10 DIAGNOSIS — Z95828 Presence of other vascular implants and grafts: Secondary | ICD-10-CM

## 2020-08-10 DIAGNOSIS — Z171 Estrogen receptor negative status [ER-]: Secondary | ICD-10-CM

## 2020-08-10 LAB — CBC WITH DIFFERENTIAL/PLATELET
Abs Immature Granulocytes: 0.03 10*3/uL (ref 0.00–0.07)
Basophils Absolute: 0 10*3/uL (ref 0.0–0.1)
Basophils Relative: 0 %
Eosinophils Absolute: 0.1 10*3/uL (ref 0.0–0.5)
Eosinophils Relative: 2 %
HCT: 34.8 % — ABNORMAL LOW (ref 36.0–46.0)
Hemoglobin: 11.6 g/dL — ABNORMAL LOW (ref 12.0–15.0)
Immature Granulocytes: 0 %
Lymphocytes Relative: 13 %
Lymphs Abs: 1.1 10*3/uL (ref 0.7–4.0)
MCH: 28.4 pg (ref 26.0–34.0)
MCHC: 33.3 g/dL (ref 30.0–36.0)
MCV: 85.3 fL (ref 80.0–100.0)
Monocytes Absolute: 0.9 10*3/uL (ref 0.1–1.0)
Monocytes Relative: 10 %
Neutro Abs: 6.8 10*3/uL (ref 1.7–7.7)
Neutrophils Relative %: 75 %
Platelets: 236 10*3/uL (ref 150–400)
RBC: 4.08 MIL/uL (ref 3.87–5.11)
RDW: 13.2 % (ref 11.5–15.5)
WBC: 9.1 10*3/uL (ref 4.0–10.5)
nRBC: 0 % (ref 0.0–0.2)

## 2020-08-10 LAB — COMPREHENSIVE METABOLIC PANEL
ALT: 13 U/L (ref 0–44)
AST: 18 U/L (ref 15–41)
Albumin: 3.6 g/dL (ref 3.5–5.0)
Alkaline Phosphatase: 66 U/L (ref 38–126)
Anion gap: 8 (ref 5–15)
BUN: 14 mg/dL (ref 8–23)
CO2: 28 mmol/L (ref 22–32)
Calcium: 8.7 mg/dL — ABNORMAL LOW (ref 8.9–10.3)
Chloride: 101 mmol/L (ref 98–111)
Creatinine, Ser: 0.72 mg/dL (ref 0.44–1.00)
GFR calc Af Amer: >60 mL/min (ref >60)
GFR calc non Af Amer: >60 mL/min (ref >60)
Glucose, Bld: 124 mg/dL — ABNORMAL HIGH (ref 70–99)
Potassium: 3.8 mmol/L (ref 3.5–5.1)
Sodium: 137 mmol/L (ref 135–145)
Total Bilirubin: 0.6 mg/dL (ref 0.3–1.2)
Total Protein: 7.1 g/dL (ref 6.5–8.1)

## 2020-08-10 MED ORDER — OXYCODONE HCL 5 MG PO TABS: 5.0000 mg | ORAL_TABLET | Freq: Four times a day (QID) | ORAL | 0 refills | Status: DC | PRN

## 2020-08-10 MED ORDER — HEPARIN SOD (PORK) LOCK FLUSH 100 UNIT/ML IV SOLN
500.0000 [IU] | Freq: Once | INTRAVENOUS | Status: AC
  Administered 2020-08-10: 500 [IU] via INTRAVENOUS
  Filled 2020-08-10: qty 5

## 2020-08-10 MED ORDER — SODIUM CHLORIDE 0.9% FLUSH
10.0000 mL | Freq: Once | INTRAVENOUS | Status: AC
  Administered 2020-08-10: 10 mL via INTRAVENOUS
  Filled 2020-08-10: qty 10

## 2020-08-10 NOTE — Progress Notes (Signed)
Pt with continued lymphedema of rigth breast in chest area and radiates to right shoulder. She has been given info to massage the fluid with Gwenette Greet. She has no energy. BM ok as long as pt take senna every day. Sleeps good most of the times. Eating good.

## 2020-08-11 ENCOUNTER — Other Ambulatory Visit: Payer: Self-pay

## 2020-08-11 ENCOUNTER — Inpatient Hospital Stay: Payer: PPO | Admitting: Occupational Therapy

## 2020-08-11 DIAGNOSIS — I972 Postmastectomy lymphedema syndrome: Secondary | ICD-10-CM

## 2020-08-11 DIAGNOSIS — R109 Unspecified abdominal pain: Secondary | ICD-10-CM | POA: Diagnosis not present

## 2020-08-11 DIAGNOSIS — I341 Nonrheumatic mitral (valve) prolapse: Secondary | ICD-10-CM | POA: Diagnosis not present

## 2020-08-11 DIAGNOSIS — E782 Mixed hyperlipidemia: Secondary | ICD-10-CM | POA: Diagnosis not present

## 2020-08-11 DIAGNOSIS — R002 Palpitations: Secondary | ICD-10-CM | POA: Diagnosis not present

## 2020-08-11 LAB — CANCER ANTIGEN 15-3: CA 15-3: 10.5 U/mL (ref 0.0–25.0)

## 2020-08-11 LAB — CANCER ANTIGEN 27.29: CA 27.29: 9.7 U/mL (ref 0.0–38.6)

## 2020-08-11 NOTE — Therapy (Signed)
[facility] [address] [place], [place], [place], [address] Phone: [phone]   Fax:  [phone]  Occupational Therapy Screen  Patient Details  Name: Debra Little MRN: [id number] Date of Birth: 1956/09/08 Referring Provider (OT): Dr Janese Banks   Encounter Date: 08/11/2020   OT End of Session - 08/11/20 1212    Visit Number 0           Past Medical History:  Diagnosis Date  . Anemia    d/t chemo - last Hgb 10.8  . Anxiety    sees Dr. [name]  . Breast cancer ([place]) 2020  . Cyst of breast    per [name]  . Dysrhythmia    Hx of palpitations  . Family history of breast cancer   . Family history of colon cancer   . Family history of leukemia   . Family history of ovarian cancer   . Fibromyalgia   . GERD (gastroesophageal reflux disease)   . Heart murmur   . Hemorrhoids   . Herpes, genital 04/2015   confirmed with HSV 2 IgG  . Hiatal hernia   . Hypercholesterolemia    Dr. Kyra Searles  . Hyperlipidemia   . IBS (irritable bowel syndrome)    constipation predominant  . IC (interstitial cystitis)    per [facility]  . Mild depression ([place])   . MVP (mitral valve prolapse)    [facility] cards eval 2012  . Osteopenia    2010/2017, DEXA at [facility];spine and fem neck  . PONV (postoperative nausea and vomiting)   . Vitamin D deficiency    history of    Past Surgical History:  Procedure Laterality Date  . ABDOMINAL HYSTERECTOMY    . APPENDECTOMY    . BLADDER SURGERY     1980's  . BREAST EXCISIONAL BIOPSY Right    benign  . BREAST EXCISIONAL BIOPSY Bilateral    benign  . COLONOSCOPY  09/2014   Dr. [name]  . COLONOSCOPY  2009   at [facility] 1 POLYP (BENIGN)  . excision of breast cysts     hx of multiple cyst aspirations  . EXPLORATORY LAPAROTOMY    . MASTECTOMY MODIFIED RADICAL Bilateral 10/09/2019   Procedure: RIGHT MASTECTOMY MODIFIED RADICAL AND LEFT TOTAL MASTECTOMY;  Surgeon: Rolm Bookbinder, MD;   Location: [place];  Service: General;  Laterality: Bilateral;  . PORTA CATH INSERTION N/A 08/06/2019   Procedure: PORTA CATH INSERTION;  Surgeon: Katha Cabal, MD;  Location: [place] [facility];  Service: Cardiovascular;  Laterality: N/A;  . PORTACATH PLACEMENT Left 08/05/2019   Procedure: INSERTION PORT-A-CATH, Attempted;  Surgeon: Jules Husbands, MD;  Location: [facility];  Service: General;  Laterality: Left;  . TUBAL LIGATION      There were no vitals filed for this visit.   Subjective Assessment - 08/11/20 1210    Subjective  I am done with my radiation - did get my compression garments -but did not get with you to check to fit - it was so hot this summer to wear - I am more sore , full , tight , pressure feeling in my chest , shoulder blade and under my arm since I have seen you in April    Currently in Pain? Yes    Pain Score 6     Pain Location --   chest   Pain Orientation Right    Pain Descriptors / Indicators Heaviness;Tightness   full feeling  NOTE FROM 11/11/2019  Pt refer by Dr Smith Robert for OT to screen for education on lymphedema and ROM Post mastectomy. 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 The metaplastic pathology particularly make this aggressive. I would like to complete her adjuvant chemotherapy at this time and give her 1 cycle of AC chemotherapy followed by 12 cycles of weekly Taxol.  I will check  PD-L1 status on her final pathology. If she has significant PD-L1 expression it would make sense to continue Keytruda which she has been approved for compassionate use through the drug company. However if she does not have any significant PD-L1 expression I will plan to drop her Keytruda.Treatment will be given with a curative intent  Patient is still healing from her mastectomy wound and I will give her about 3 weeks to see if she has recovered and ready to start chemo  Upon completion of chemotherapy I will refer her to radiation oncology to discuss adjuvant radiation treatment  OT note12/15/2020: Pt complain about pain this am at hernia - she took some pain meds and than helped - she will call her GI MD. Pt is about 5 wks out from bilateral mastectomy with 9 axillary ln removed on R per pt. Scar healing very well on bilateral chest - but R lateral part still draining per pt after having infection and has bandage on- she do have a velcro tube compression garment on - Done with her antibiotic for about week per pt and next appt with Dr Dwain Sarna is 30th Dec  Pt show decrease Shoulder AROM over head and external rotation - pt provide with some HEP for those to do and keep pain under 2/10  Pt ed on lymphedema and hand out provided - circumference WNL - pt is R hand dominant Will check on pt again in month   OT NOTE 12/03/2019: Pt arrive with incision healed and starting chemo yesterday back again.  Incision healed but scar adhesions worse on R chest and axilla limiting her shoulder over head and external rotation - pt to do scar mobs inarmover head position - and  Cont with AAROM on wall for shoulder flexion , external Scapula squeezes few times during day  Her L upper arm circumference to elbow increase compare to 3 wks ago and recommend for pt to get over the counter compression sleeve and glove - she moved this past weekend and was picking upr things per pt she should have not -  but did not had a lot of help  Also would recommend unilateral post mastectomy jovipak breast pad to use during day - pt report some swelling under scars - but could be too because of scars adhesions Want to be preventative prior to radiation in future too   Pt to call me when she get compression garments  NOTE FROM 12/31/2019: Pt arrive with herunilateral post mastectomy jovipak breast pad on under her bra - looks good fit - review wearing it when she done a lot of heavy lifting or repetition  and feels her chest or under arm is swollen - to wear it Want to be preventative prior to radiation in future too  Her L upper arm circumference to elbow increaselast timecompare tothe3 wksbefore than - she arrive with her [employer] strongcompression sleeve - circumference decrease in R UE -and pt to only wear it now with high risk activities - do not have to wear glove -except if swelling in the hand   Incision healed but scarTight under arm and axilla on Rlimiting her shoulder over head and external rotationend range- pt to do scar mobs inarmover head position - and  Cont with AAROM on wall for shoulder flexion , external Scapula squeezes few times during day    Pt to follow up one more time in month  Note for 01/28/2020)  Pt seen this date for follow up - she report wearing her sleeve longer periods if she things she going to use it a lot- remind pt to only wear with high risk activities - if going to wear longer periods she needs to wear glove too.  Her measurements did increase on R UE - but she also increased on L UE - and had weight gain of about 5 lbs.  She is still increase about 1 cm compare to L -but she is R hand dominant. She report increase swelling all over the few days or week after chemo but then decreases- pt to cont with jovipak breast pad as needed. And AROM in shoulder WNL and scar tissue great - and she is using her R UE normally. Pt to wear over the  counter sleeve only with hight risk activities. Will reassess in 6 wks when chemo is finish and around time she is starting radiation.   Note for 03/10/2020: Pt return after not seen for 6 wks - finished chemo and getting ready to start radiation next week. Pt measurements in R UE increase this date compare to 6 wks ago by 1cm in upper arm, 2 cm at elbow and forearm and 0.5 at wrist -  Pt did not increase in L UE - no weight changes  Pt now increase compare to L UE by 2.5 in upper arm, 1.8 at elbow , 3 cm at forearm and 1.5 cm at wrist  Pt now Stage 2 lymphedema - would recommend OT order to eval and tx pt for assessment for appropriate night time compression garment/or pump- as well as teach self MLD - and if need different daytime compression sleeve to maintain her lymphedema. Pt starting radiation next week         NOTE FOR 08/11/2020:  Pt refer for increase tightness, fullness, soreness, heaviness in R more than L chest , under arm and shoulder blade. Since pt was seen in April - she finished radiation , and got her daytime [employer] Elvarex soft sleeve and glove-  Assess this date the fit - fit very well and pt ed on donning , doffing  And wearing only with high risk activities - to wash and stretch wrist and fingers when drying - if to tight Circumference WNL compare to L side -and pt R hand dominant    LYMPHEDEMA/ONCOLOGY QUESTIONNAIRE - 08/11/20 0001      Right Upper Extremity Lymphedema   15 cm Proximal to Olecranon Process 29 cm    10 cm Proximal to Olecranon Process 27 cm    Olecranon Process 23.3 cm    15 cm Proximal to Ulnar Styloid Process 22 cm  10 cm Proximal to Ulnar Styloid Process 19 cm    Just Proximal to Ulnar Styloid Process 15 cm    Across Hand at [employer] 17.2 cm    At [place] of 2nd Digit 5.5 cm    At [facility] of Thumb 6 cm      Left Upper Extremity Lymphedema   15 cm Proximal to Olecranon Process 28 cm    10 cm Proximal to Olecranon Process 26 cm     Olecranon Process 22.3 cm    15 cm Proximal to Ulnar Styloid Process 20.4 cm    10 cm Proximal to Ulnar Styloid Process 18.5 cm    Just Proximal to Ulnar Styloid Process 14 cm    Across Hand at [employer] 17 cm    At [place] of 2nd Digit 6 cm    At [facility] of Thumb 5.4 cm          Pt do report some neuropathy still in feet and fingers  And then numbness in am and during day with some activities in R hand  Pt negative for T[place] over CT but positive Phalens test for CT   pt ed on modifying how she hold , grip with R hand during tasks- because of neuropathy -and positioning with sleeping, holding cell phone and cooking  Measurements look great and decrease since in April Pt do report increase lymphedema signs in R Upper thoracic - tender under arm over scars - tight with over head reaching , and then above and below scar on chest -  Pt ed on using jovipak again at night time - under camisole or bra And will recommend chest piece for pump - will contact Rep about upgrade     Will follow up in 2 wks                                 Visit Diagnosis: Postmastectomy lymphedema syndrome    Problem List Patient Active Problem List   Diagnosis Date Noted  . Ductal carcinoma in situ (DCIS) of left breast 03/04/2020  . Bilateral breast cancer ([place]) 10/09/2019  . Genetic testing 08/27/2019  . Family history of breast cancer   . Family history of ovarian cancer   . Family history of colon cancer   . Family history of leukemia   . Goals of care, counseling/discussion 08/07/2019  . Breast cancer ([place]) 07/30/2019  . GERD (gastroesophageal reflux disease) 06/30/2019  . Abdominal discomfort 05/15/2019  . Advance care planning 07/14/2018  . Health care maintenance 07/14/2018  . Depression, recurrent ([place]) 07/14/2018  . Constipation 07/14/2018  . Altered taste 07/14/2018  . Fibrocystic breast changes of both breasts 01/24/2018  . Vasomotor symptoms due to  menopause 09/05/2017  . Herpes simplex vulvovaginitis 09/05/2017  . Skin nodule 05/25/2017  . Encounter for screening examination for infectious disease 03/18/2017  . Lipoma 03/18/2017  . Vaginitis 03/18/2017  . Radicular pain in left arm 07/27/2015  . Neuralgia and neuritis, unspecified 07/27/2015  . Fatigue 03/05/2015  . B12 deficiency 03/05/2015  . Skin rash 05/01/2014  . MVP (mitral valve prolapse) 04/30/2014  . Cyst of breast 04/05/2014  . Interstitial cystitis 03/31/2014  . Routine general medical examination at a health care facility 11/13/2011  . Anxiety and depression 10/06/2010  . BREAST CYSTS, BILATERAL 12/03/2008  . PALPITATIONS, CHRONIC 12/03/2008  . Vitamin D deficiency 09/10/2008  . HLD (hyperlipidemia) 09/10/2008  .  IRRITABLE BOWEL SYNDROME 02/12/2008    Rosalyn Gess OTR/l,CLT 08/11/2020, 12:13 PM  [facility] [address] [place], [place], [place], [address] Phone: [phone]   Fax:  [phone]  Name: Debra Little MRN: [id number] Date of Birth: [birth year]

## 2020-08-13 NOTE — Progress Notes (Signed)
Hematology/Oncology Consult note [facility]  Telephone:([phone] [phone]  Patient Care Team: Tonia Ghent, MD as PCP - General (Family Medicine) Bary Castilla, Forest Gleason, MD (General Surgery) Modesto Charon, MD (Family Medicine) Laneta Simmers as Physician Assistant (Urology) [name] Javier Docker, MD as Consulting Physician (Cardiology) Sindy Guadeloupe, MD as Consulting Physician (Hematology and Oncology) Noreene Filbert, MD as Referring Physician (Radiation Oncology)   Name of the patient: Debra Little  [id number]  [birth year]   Date of visit: 08/13/20  Diagnosis- clinical prognostic stage 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 in the past and has undergone breast biopsies which did not previously showed malignancy. More recently patient noted some red discoloration around her right perioral as well as a palpable mass which led to a diagnostic mammogram on the right side her prior mammogram in February 2020 was normal in August 2020 she was noted to have a 2.7 x 2 x 2.3 cm mass in her right breast along with 4 morphologically abnormal lymph nodes. She underwent breast biopsy as well as lymph node biopsy which showed grade 3 invasive mammary carcinoma. ER PR and HER-2/neunegative.Sections show high-grade invasive carcinoma with focal squamous differentiation and pinpoint keratin ideation. The features are concerning for metaplastic differentiation.[name] Lymphovascular invasion present. There was extranodal extension present on the lymph node biopsy.   Her family history is significant for breast cancer in her mother in her 22s. Father had colon cancer in his70s.Family history also significant for ovarian cancer in her maternal aunt.  MRI of bilateral  breasts showed: Primary right breast mass was 3.5 x 2.7 x 3.4 cm. There were surrounding numerous nodules consistent with satellite lesions. Extensive nodular non-mass enhancement throughout the right breast involving the upper and lower quadrants spanning 6.2 x 4.5 x 5.5 cm. Markedly enhancement of the left breast diffusely. Non-mass enhancement measures 1.9 x 2.4 cm. At least 3 abnormal lymph nodes in the right axilla. No abnormal left axillary lymph nodes. 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 Equivocal inferior breast and left axillary nodal hypermetabolism. No evidence of extrathoracic hypermetabolic metastases. Lateral right breast primary with right axillary nodal metastases  Patient had 3 cycles of neoadjuvant AC chemotherapy along with Keytruda. There was no significant response in her tumor and patient proceeded with bilateral mastectomy with targeted node dissection. 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 7 lymph nodes were positive for malignancy with extranodal extension overall cancer cellularity was 40% [id number].Patient completed 1 cycle of adjuvant AC chemotherapy and willcomplete weekly Taxol chemotherapy in April 2021. She completed post mastectomy radiation  Interval history-feels anxious about the possibility that the cancer can come back in the future.  Reports having lymphedema in the arm swelling more in the right as compared to left and feels that there fluid sometimes goes to her chest wall and makes it feel tight.  Appetite is good and she has not lost any weight.  Denies any new aches and pains anywhere.  Reports chronic fatigue ECOG PS- 1 Pain scale- 3 Opioid associated constipation- no  Review of systems- Review of Systems  Constitutional: Positive for malaise/fatigue. Negative for chills, fever and  weight loss.  HENT: Negative  for congestion, ear discharge and nosebleeds.   Eyes: Negative for blurred vision.  Respiratory: Negative for cough, hemoptysis, sputum production, shortness of breath and wheezing.   Cardiovascular: Negative for chest pain, palpitations, orthopnea and claudication.  Gastrointestinal: Negative for abdominal pain, blood in stool, constipation, diarrhea, heartburn, melena, nausea and vomiting.  Genitourinary: Negative for dysuria, flank pain, frequency, hematuria and urgency.  Musculoskeletal: Negative for back pain, joint pain and myalgias.       Arm swelling  Skin: Negative for rash.  Neurological: Negative for dizziness, tingling, focal weakness, seizures, weakness and headaches.  Endo/Heme/Allergies: Does not bruise/bleed easily.  Psychiatric/Behavioral: Negative for depression and suicidal ideas. The patient does not have insomnia.       Allergies  Allergen Reactions  . Amitiza [Lubiprostone] Other (See Comments)    Overly effective at higher dose  . Bentyl [Dicyclomine Hcl]     Lack of effect  . Cortisone Other (See Comments)    flush  . Mobic [Meloxicam]     Palpitations.  But can tolerate aleve  . Nitrofurantoin Monohyd Macro   . Sulfonamide Derivatives Nausea Only  . Zoloft [Sertraline Hcl] Diarrhea and Nausea Only  . Buspar [Buspirone] Palpitations  . Celecoxib Rash    REACTION: unspecified  . Doxycycline Palpitations    chest pain  . Penicillins Rash     Past Medical History:  Diagnosis Date  . Anemia    d/t chemo - last Hgb 10.8  . Anxiety    sees Dr. [name]  . Breast cancer ([place]) 2020  . Cyst of breast    per [name]  . Dysrhythmia    Hx of palpitations  . Family history of breast cancer   . Family history of colon cancer   . Family history of leukemia   . Family history of ovarian cancer   . Fibromyalgia   . GERD (gastroesophageal reflux disease)   . Heart murmur   . Hemorrhoids   . Herpes, genital 04/2015    confirmed with HSV 2 IgG  . Hiatal hernia   . Hypercholesterolemia    Dr. Kyra Searles  . Hyperlipidemia   . IBS (irritable bowel syndrome)    constipation predominant  . IC (interstitial cystitis)    per [facility]  . Mild depression ([place])   . MVP (mitral valve prolapse)    [facility] cards eval 2012  . Osteopenia    2010/2017, DEXA at [facility];spine and fem neck  . PONV (postoperative nausea and vomiting)   . Vitamin D deficiency    history of     Past Surgical History:  Procedure Laterality Date  . ABDOMINAL HYSTERECTOMY    . APPENDECTOMY    . BLADDER SURGERY     1980's  . BREAST EXCISIONAL BIOPSY Right    benign  . BREAST EXCISIONAL BIOPSY Bilateral    benign  . COLONOSCOPY  09/2014   Dr. [name]  . COLONOSCOPY  2009   at [facility] 1 POLYP (BENIGN)  . excision of breast cysts     hx of multiple cyst aspirations  . EXPLORATORY LAPAROTOMY    . MASTECTOMY MODIFIED RADICAL Bilateral 10/09/2019   Procedure: RIGHT MASTECTOMY MODIFIED RADICAL AND LEFT TOTAL MASTECTOMY;  Surgeon: Rolm Bookbinder, MD;  Location: [place];  Service: General;  Laterality: Bilateral;  . PORTA CATH INSERTION N/A 08/06/2019   Procedure: PORTA CATH INSERTION;  Surgeon: Katha Cabal, MD;  Location: [place] [facility];  Service: Cardiovascular;  Laterality: N/A;  . PORTACATH PLACEMENT Left  08/05/2019   Procedure: INSERTION PORT-A-CATH, Attempted;  Surgeon: Jules Husbands, MD;  Location: [facility];  Service: General;  Laterality: Left;  . TUBAL LIGATION      Social History   Socioeconomic History  . Marital status: Divorced    Spouse name: Not on file  . Number of children: 1  . Years of education: Not on file  . Highest education level: Not on file  Occupational History  . Occupation: [employer]  Tobacco Use  . Smoking status: Never Smoker  . Smokeless tobacco: Never Used  Vaping Use  . Vaping Use: Never used  Substance and Sexual Activity  . Alcohol use: No    Alcohol/week: 0.0 standard drinks  .  Drug use: No  . Sexual activity: Yes    Birth control/protection: Surgical    Comment: Hysterectomy  Other Topics Concern  . Not on file  Social History Narrative   Married in 01-26-97, divorced as of 01-26-17, 1 son from previous relationship   Brother with MVA at 3, in rest home for many years as of 01-26-18- died of covid recently   Social Determinants of Radio broadcast assistant Strain: [place]   . Difficulty of Paying Living Expenses: Not hard at all  Food Insecurity: No Food Insecurity  . Worried About Charity fundraiser in the Last Year: Never true  . Ran Out of Food in the Last Year: Never true  Transportation Needs: No Transportation Needs  . Lack of Transportation (Medical): No  . Lack of Transportation (Non-Medical): No  Physical Activity: Inactive  . Days of Exercise per Week: 0 days  . Minutes of Exercise per Session: 0 min  Stress: No Stress Concern Present  . Feeling of Stress : Not at all  Social Connections:   . Frequency of Communication with Friends and Family: Not on file  . Frequency of Social Gatherings with Friends and Family: Not on file  . Attends Religious Services: Not on file  . Active Member of Clubs or Organizations: Not on file  . Attends Archivist Meetings: Not on file  . Marital Status: Not on file  Intimate Partner Violence: Not At Risk  . Fear of Current or Ex-Partner: No  . Emotionally Abused: No  . Physically Abused: No  . Sexually Abused: No    Family History  Problem Relation Age of Onset  . Breast cancer Mother 16  . Hypertension Mother   . Colon cancer Father 74  . Hyperlipidemia Father   . Ovarian cancer Maternal Aunt 80  . Breast cancer Cousin   . Gallbladder disease Paternal Grandmother   . Liver cancer Cousin 70       Malignant  . Cancer Maternal Uncle        unsure type     Current Outpatient Medications:  .  acetaminophen (TYLENOL) 650 MG CR tablet, Take 650 mg by mouth every 8 (eight) hours as needed for  pain., Disp: , Rfl:  .  Ascorbic Acid (VITAMIN C) 100 MG tablet, Take 100 mg by mouth daily., Disp: , Rfl:  .  atorvastatin (LIPITOR) 40 MG tablet, Take 40 mg by mouth at bedtime. , Disp: , Rfl: 1 .  Calcium Carbonate-Vit D-Min (CALCIUM 1200 PO), Take 4 tablets by mouth daily., Disp: , Rfl:  .  cholecalciferol (VITAMIN D3) 25 MCG (1000 UNIT) tablet, Take 1 tablet (1,000 Units total) by mouth daily., Disp: , Rfl:  .  clonazePAM (KLONOPIN) 0.5 MG tablet, Take  0.5 mg by mouth 2 (two) times daily. , Disp: , Rfl:  .  Dexlansoprazole 30 MG capsule, Take 1 capsule (30 mg total) by mouth daily., Disp: 30 capsule, Rfl: 6 .  DULoxetine (CYMBALTA) 60 MG capsule, Take 60 mg by mouth daily. , Disp: , Rfl:  .  furosemide (LASIX) 20 MG tablet, Take 1 tablet (20 mg total) by mouth every other day., Disp: , Rfl:  .  gabapentin (NEURONTIN) 100 MG capsule, Take 1-2 capsules (100-200 mg total) by mouth 3 (three) times daily as needed., Disp: 90 capsule, Rfl: 1 .  lidocaine-prilocaine (EMLA) cream, APPLY TO AFFECTED AREAS ONCE AS DIRECTED, Disp: 30 g, Rfl: 3 .  loratadine (CLARITIN) 10 MG tablet, Take 1 tablet (10 mg total) by mouth daily., Disp: , Rfl:  .  ondansetron (ZOFRAN) 8 MG tablet, Take 1 tablet (8 mg total) by mouth 2 (two) times daily as needed. Start on the third day after chemotherapy., Disp: 30 tablet, Rfl: 1 .  Plecanatide (TRULANCE) 3 MG TABS, Take 3 mg by mouth daily. , Disp: , Rfl:  .  sennosides-docusate sodium (SENOKOT-S) 8.6-50 MG tablet, Take 1-2 tablets by mouth at bedtime. , Disp: , Rfl:  .  valACYclovir (VALTREX) 500 MG tablet, Take 1 tablet (500 mg total) by mouth daily., Disp: 90 tablet, Rfl: 0 .  vitamin B-12 (CYANOCOBALAMIN) 500 MCG tablet, Take 500 mcg by mouth daily., Disp: , Rfl:  .  HYDROcodone-acetaminophen (NORCO/VICODIN) 5-325 MG tablet, Take 1 tablet by mouth every 6 (six) hours as needed for moderate pain. (Patient not taking: Reported on 08/10/2020), Disp: 15 tablet, Rfl: 0 .   oxyCODONE (OXY IR/ROXICODONE) 5 MG immediate release tablet, Take 1 tablet (5 mg total) by mouth every 6 (six) hours as needed for severe pain., Disp: 30 tablet, Rfl: 0 .  sucralfate (CARAFATE) 1 g tablet, Take 1 g by mouth 3 (three) times daily.  (Patient not taking: Reported on 08/10/2020), Disp: , Rfl:   Physical exam:  Vitals:   08/10/20 1209  BP: 120/70  Pulse: 73  Resp: 16  Temp: 98.5 F (36.9 C)  TempSrc: Oral  Weight: 155 lb 1.6 oz (70.4 kg)  Height: _0  (1.702 m)   Physical Exam Constitutional:      General: She is not in acute distress. Cardiovascular:     Rate and Rhythm: Normal rate and regular rhythm.     Heart sounds: Normal heart sounds.  Pulmonary:     Effort: Pulmonary effort is normal.     Breath sounds: Normal breath sounds.  Abdominal:     General: Bowel sounds are normal.     Palpations: Abdomen is soft.  Musculoskeletal:     Comments: Bilateral upper arms are similar in size and girth  Skin:    General: Skin is warm and dry.  Neurological:     Mental Status: She is alert and oriented to person, place, and time.   Patient is s/p bilateral mastectomy without reconstruction.  No evidence of chest wall recurrence.  No palpable bilateral axillary adenopathy.  CMP Latest Ref Rng & Units 08/10/2020  Glucose 70 - 99 mg/dL 124(H)  BUN 8 - 23 mg/dL 14  Creatinine 0.44 - 1.00 mg/dL 0.72  Sodium 135 - 145 mmol/L 137  Potassium 3.5 - 5.1 mmol/L 3.8  Chloride 98 - 111 mmol/L 101  CO2 22 - 32 mmol/L 28  Calcium 8.9 - 10.3 mg/dL 8.7(L)  Total Protein 6.5 - 8.1 g/dL 7.1  Total Bilirubin 0.3 -  1.2 mg/dL 0.6  Alkaline Phos 38 - 126 U/L 66  AST 15 - 41 U/L 18  ALT 0 - 44 U/L 13   CBC Latest Ref Rng & Units 08/10/2020  WBC 4.0 - 10.5 K/uL 9.1  Hemoglobin 12.0 - 15.0 g/dL 11.6(L)  Hematocrit 36 - 46 % 34.8(L)  Platelets 150 - 400 K/uL 236    No images are attached to the encounter.  No results found.   Assessment and plan- Patient is a 64 y.o. female  withclinical prognostic stage IIIb cT2 cN1 M0 triple negative invasive mammary carcinoma of the right breast.She is s/p 3 cycles of neoadjuvant AC Keytruda chemotherapy followed by bilateral mastectomy and targeted node dissection due tonosignificant response to neoadjuvant chemotherapy. She completed dd ACT followed by radiation to chest wall adjuvantlyShe went on to get bilateral mastectomy and node dissection which showed stage IIIc diseasepT 2pN2 a. This is a routine follow-up visit for breast cancer  1.  From breast cancer standpoint patient is doing better and clinically there is no concerning signs and symptoms of recurrence.  She also had a recent CT chest abdomen and pelvis with contrast in August 28-monthwhich did not show any evidence of recurrent or metastatic disease.   2.  [facility] of her right upper extremity swelling.  On my assessment her bilateral arms appear similar in girth which she has been dealing with postmastectomy lymphedema syndrome  I will see her back in 3 months with CBC with differential and CMP Visit Diagnosis 1. Encounter for follow-up surveillance of breast cancer   2. Postmastectomy lymphedema syndrome      Dr. ARanda Evens MD, MPH [facility][id number]/17/2021 10:30 AM

## 2020-08-17 ENCOUNTER — Other Ambulatory Visit: Payer: Self-pay

## 2020-08-17 ENCOUNTER — Encounter: Payer: PPO | Attending: Oncology

## 2020-08-17 VITALS — Ht 67.5 in | Wt 156.5 lb

## 2020-08-17 DIAGNOSIS — Z853 Personal history of malignant neoplasm of breast: Secondary | ICD-10-CM

## 2020-08-17 NOTE — Progress Notes (Signed)
Daily Session Note  Patient Details  Name: Debra Little MRN: [id number] Date of Birth: 03-24-1956 Referring Provider:    Encounter Date: 08/17/2020  Check In:  Session Check In - 08/17/20 1346      Check-In   Supervising physician immediately available to respond to emergencies See telemetry face sheet for immediately available ER MD    Location [facility]    Staff Present Coralie Keens, MS Exercise Physiologist;Jessica Luan Pulling, MA, RCEP, CCRP, CCET    Virtual Visit No    Medication changes reported     No    Fall or balance concerns reported    No    Warm-up and Cool-down Not performed (comment)   6MWT and Gym Orientation   Resistance Training Performed No   6MWT and Gym Orientation   VAD Patient? No    PAD/SET Patient? No      Pain Assessment   Currently in Pain? No/denies             Exercise Prescription Changes - 08/17/20 1300      Response to Exercise   Blood Pressure (Admit) 124/72    Blood Pressure (Exercise) 154/70    Blood Pressure (Exit) 122/74    Heart Rate (Admit) 70 bpm    Heart Rate (Exercise) 110 bpm    Heart Rate (Exit) 75 bpm    Oxygen Saturation (Admit) 97 %    Oxygen Saturation (Exercise) 95 %    Oxygen Saturation (Exit) 95 %    Rating of Perceived Exertion (Exercise) 10    Perceived Dyspnea (Exercise) 2    Symptoms SOB, leg weakness 5/10    Comments walk test results      Progression   Progression Continue to progress workloads to maintain intensity without signs/symptoms of physical distress.      Resistance Training   Weight 3 lb    Reps 10-15      Treadmill   MPH 2.3    Grade 0.5    Minutes 15    METs 2.92      Recumbant Bike   Level 2    RPM 60    Watts 20    Minutes 15    METs 4.1      NuStep   Level 2    SPM 80    Minutes 15    METs 4.1      REL-XR   Level 2    Minutes 15    METs 4.1      T5 Nustep   Level 1    SPM 80    Minutes 15    METs 4.1            6 Minute Walk    Row  Name 08/17/20 1347         6 Minute Walk   Phase Initial     Distance 1463 feet     Walk Time 6 minutes     # of Rest Breaks 0     MPH 2.77     METS 4.16     RPE 10     Perceived Dyspnea  2     VO2 Peak 14.5     Symptoms Yes (comment)     Comments SOB, resolved with rest. Leg weakness 5/10     Resting HR 70 bpm     Resting BP 124/72     Resting Oxygen Saturation  97 %     Exercise Oxygen Saturation  during  6 min walk 95 %     Max Ex. HR 110 bpm     Max Ex. BP 154/70     2 Minute Post BP 122/74            Social History   Tobacco Use  Smoking Status Never Smoker  Smokeless Tobacco Never Used    Goals Met:  Proper associated with RPD/PD & O2 Sat Exercise tolerated well Queuing for purse lip breathing No report of cardiac concerns or symptoms  Goals Unmet:  Not Applicable  Comments: Patient was oriented to gym and equipment including functions, settings, policies, and procedures. All starting workloads were established based on the results of the 6 minute walk test done at initial orientation visit.  The plan for exercise progression was also introduced and progression will be customized based on patient's performance and goals.   Dr. Emily Filbert is Medical Director for [place] and [facility].

## 2020-08-19 ENCOUNTER — Other Ambulatory Visit: Payer: Self-pay

## 2020-08-19 DIAGNOSIS — Z853 Personal history of malignant neoplasm of breast: Secondary | ICD-10-CM

## 2020-08-19 NOTE — Progress Notes (Signed)
Daily Session Note  Patient Details  Name: Debra Little MRN: [id number] Date of Birth: Aug 04, 1956 Referring Provider:    Encounter Date: 08/19/2020  Check In:  Session Check In - 08/19/20 1253      Check-In   Supervising physician immediately available to respond to emergencies See telemetry face sheet for immediately available ER MD    Location [facility]    Staff Present Coralie Keens, MS Exercise Physiologist;Amanda Oletta Darter, [place], ACSM CEP, Exercise Physiologist    Virtual Visit No    Medication changes reported     No    Fall or balance concerns reported    No    Warm-up and Cool-down Performed on first and last piece of equipment    Resistance Training Performed Yes   Lower extremity only   VAD Patient? No             Social History   Tobacco Use  Smoking Status Never Smoker  Smokeless Tobacco Never Used    Goals Met:  Independence with exercise equipment Exercise tolerated well No report of cardiac concerns or symptoms Strength training completed today  Goals Unmet:  Not Applicable  Comments: First full day of exercise!  Patient was oriented to gym and equipment including functions, settings, policies, and procedures.  Patient's individual exercise prescription and treatment plan were reviewed.  All starting workloads were established based on the results of the 6 minute walk test done at initial orientation visit.  The plan for exercise progression was also introduced and progression will be customized based on patient's performance and goals.  Patient did not have compression sleeve for exercise today and was advised to bring to her following appointments. Patient refrained from using upper body today and only exercised lower body only.    Dr. Emily Filbert is Medical Director for [place] and [facility].

## 2020-08-23 ENCOUNTER — Ambulatory Visit: Payer: PPO | Admitting: Oncology

## 2020-08-23 ENCOUNTER — Other Ambulatory Visit: Payer: PPO

## 2020-08-25 ENCOUNTER — Inpatient Hospital Stay: Payer: PPO | Admitting: Occupational Therapy

## 2020-09-01 ENCOUNTER — Inpatient Hospital Stay: Payer: PPO | Attending: Oncology | Admitting: Occupational Therapy

## 2020-09-01 ENCOUNTER — Other Ambulatory Visit: Payer: Self-pay

## 2020-09-01 DIAGNOSIS — Z853 Personal history of malignant neoplasm of breast: Secondary | ICD-10-CM | POA: Insufficient documentation

## 2020-09-01 DIAGNOSIS — M25611 Stiffness of right shoulder, not elsewhere classified: Secondary | ICD-10-CM

## 2020-09-01 DIAGNOSIS — Z9013 Acquired absence of bilateral breasts and nipples: Secondary | ICD-10-CM | POA: Insufficient documentation

## 2020-09-01 DIAGNOSIS — R222 Localized swelling, mass and lump, trunk: Secondary | ICD-10-CM | POA: Insufficient documentation

## 2020-09-01 DIAGNOSIS — I972 Postmastectomy lymphedema syndrome: Secondary | ICD-10-CM

## 2020-09-01 NOTE — Therapy (Signed)
[facility] [address] [place], [place], [place], [address] Phone: [phone]   Fax:  [phone]  Occupational Therapy Screen  Patient Details  Name: Debra Little MRN: [id number] Date of Birth: 11/17/1956 Referring Provider (OT): Dr Janese Banks   Encounter Date: 09/01/2020   OT End of Session - 09/01/20 1307    Visit Number 0           Past Medical History:  Diagnosis Date  . Anemia    d/t chemo - last Hgb 10.8  . Anxiety    sees Dr. [name]  . Breast cancer ([place]) 2020  . Cyst of breast    per [name]  . Dysrhythmia    Hx of palpitations  . Family history of breast cancer   . Family history of colon cancer   . Family history of leukemia   . Family history of ovarian cancer   . Fibromyalgia   . GERD (gastroesophageal reflux disease)   . Heart murmur   . Hemorrhoids   . Herpes, genital 04/2015   confirmed with HSV 2 IgG  . Hiatal hernia   . Hypercholesterolemia    Dr. Kyra Searles  . Hyperlipidemia   . IBS (irritable bowel syndrome)    constipation predominant  . IC (interstitial cystitis)    per [facility] uro  . Mild depression ([place])   . MVP (mitral valve prolapse)    [employer] cards eval 2012  . Osteopenia    2010/2017, DEXA at [facility];spine and fem neck  . PONV (postoperative nausea and vomiting)   . Vitamin D deficiency    history of    Past Surgical History:  Procedure Laterality Date  . ABDOMINAL HYSTERECTOMY    . APPENDECTOMY    . BLADDER SURGERY     1980's  . BREAST EXCISIONAL BIOPSY Right    benign  . BREAST EXCISIONAL BIOPSY Bilateral    benign  . COLONOSCOPY  09/2014   Dr. [name]  . COLONOSCOPY  2009   at [facility] 1 POLYP (BENIGN)  . excision of breast cysts     hx of multiple cyst aspirations  . EXPLORATORY LAPAROTOMY    . MASTECTOMY MODIFIED RADICAL Bilateral 10/09/2019   Procedure: RIGHT MASTECTOMY MODIFIED RADICAL AND LEFT TOTAL MASTECTOMY;  Surgeon: Rolm Bookbinder, MD;   Location: [place];  Service: General;  Laterality: Bilateral;  . PORTA CATH INSERTION N/A 08/06/2019   Procedure: PORTA CATH INSERTION;  Surgeon: Katha Cabal, MD;  Location: [place] [facility];  Service: Cardiovascular;  Laterality: N/A;  . PORTACATH PLACEMENT Left 08/05/2019   Procedure: INSERTION PORT-A-CATH, Attempted;  Surgeon: Jules Husbands, MD;  Location: [facility];  Service: General;  Laterality: Left;  . TUBAL LIGATION    NOTE FROM 11/11/2019  Pt refer by Dr Janese Banks for OT to screen for education on lymphedema and ROM Post mastectomy. 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 The metaplastic pathology particularly make this aggressive. I would like to complete her adjuvant chemotherapy  at this time and give her 1 cycle of AC chemotherapy followed by 12 cycles of weekly Taxol.  I will check PD-L1 status on her final pathology. If she has significant PD-L1 expression it would make sense to continue Keytruda which she has been approved for compassionate use through the drug company. However if she does not have any significant PD-L1 expression I will plan to drop her Keytruda.Treatment will be given with a curative intent  Patient is still healing from her mastectomy wound and I will give her about 3 weeks to see if she has recovered and ready to start chemo  Upon completion of chemotherapy I will refer her to radiation oncology  to discuss adjuvant radiation treatment  OT note12/15/2020: Pt complain about pain this am at hernia - she took some pain meds and than helped - she will call her GI MD. Pt is about 5 wks out from bilateral mastectomy with 9 axillary ln removed on R per pt. Scar healing very well on bilateral chest - but R lateral part still draining per pt after having infection and has bandage on- she do have a velcro tube compression garment on - Done with her antibiotic for about week per pt and next appt with Dr Donne Hazel is 30th Dec  Pt show decrease Shoulder AROM over head and external rotation - pt provide with some HEP for those to do and keep pain under 2/10  Pt ed on lymphedema and hand out provided - circumference WNL - pt is R hand dominant Will check on pt again in month   OT NOTE 12/03/2019: Pt arrive with incision healed and starting chemo yesterday back again.  Incision healed but scar adhesions worse on R chest and axilla limiting her shoulder over head and external rotation - pt to do scar mobs inarmover head position - and  Cont with AAROM on wall for shoulder flexion , external Scapula squeezes few times during day  Her L upper arm circumference to elbow increase compare to 3 wks ago and recommend for pt to get over the counter compression sleeve and glove - she moved this past weekend and was picking upr things per pt she should have not - but did not had a lot of help  Also would recommend unilateral post mastectomy jovipak breast pad to use during day - pt report some swelling under scars - but could be too because of scars adhesions Want to be preventative prior to radiation in future too   Pt to call me when she get compression garments  NOTE FROM 12/31/2019: Pt arrive with herunilateral post mastectomy jovipak breast pad on under her bra - looks good fit - review wearing it when she done a lot of heavy lifting or repetition and feels her chest or under arm is swollen - to  wear it Want to be preventative prior to radiation in future too  Her L upper arm circumference to elbow increaselast timecompare tothe3 wksbefore than - she arrive with her [place] strongcompression sleeve - circumference decrease in R UE -and pt to only wear it now with high risk activities - do not have to wear glove -except if swelling in the hand   Incision healed but scarTight under arm and axilla on Rlimiting her shoulder over head and external rotationend range- pt to do scar mobs inarmover head position - and  Cont with AAROM on wall for shoulder flexion , external Scapula squeezes few times during day    Pt to  follow up one more time in month  Note for 01/28/2020) Pt seen this date for follow up - she report wearing her sleeve longer periods if she things she going to use it a lot- remind pt to only wear with high risk activities - if going to wear longer periods she needs to wear glove too.  Her measurements did increase on R UE - but she also increased on L UE - and had weight gain of about 5 lbs.  She is still increase about 1 cm compare to L -but she is R hand dominant. She report increase swelling all over the few days or week after chemo but then decreases- pt to cont with jovipak breast pad as needed. And AROM in shoulder WNL and scar tissue great - and she is using her R UE normally. Pt to wear over the counter sleeve only with hight risk activities. Will reassess in 6 wks when chemo is finish and around time she is starting radiation.   Note for 03/10/2020: Pt return after not seen for 6 wks - finished chemo and getting ready to start radiation next week. Pt measurements in R UE increase this date compare to 6 wks ago by 1cm in upper arm, 2 cm at elbow and forearm and 0.5 at wrist -  Pt did not increase in L UE - no weight changes  Pt now increase compare to L UE by 2.5 in upper arm, 1.8 at elbow , 3 cm at forearm and 1.5 cm at wrist  Pt now  Stage 2 lymphedema - would recommend OT order to eval and tx pt for assessment for appropriate night time compression garment/or pump- as well as teach self MLD - and if need different daytime compression sleeve to maintain her lymphedema. Pt starting radiation next week       NOTE FOR 08/11/2020:  Pt refer for increase tightness, fullness, soreness, heaviness in R more than L chest , under arm and shoulder blade. Since pt was seen in April - she finished radiation , and got her daytime Jobst Elvarex soft sleeve and glove-  Assess this date the fit - fit very well and pt ed on donning , doffing  And wearing only with high risk activities - to wash and stretch wrist and fingers when drying - if to tight Circumference WNL compare to L side -and pt R hand dominant        LYMPHEDEMA/ONCOLOGY QUESTIONNAIRE - 08/11/20 0001              Right Upper Extremity Lymphedema    15 cm Proximal to Olecranon Process 29 cm     10 cm Proximal to Olecranon Process 27 cm     Olecranon Process 23.3 cm     15 cm Proximal to Ulnar Styloid Process 22 cm     10 cm Proximal to Ulnar Styloid Process 19 cm     Just Proximal to Ulnar Styloid Process 15 cm     Across Hand at [facility] 17.2 cm     At [place] of 2nd Digit 5.5 cm     At [facility] of Thumb 6 cm          Left Upper Extremity Lymphedema    15 cm Proximal to Olecranon Process 28 cm     10 cm Proximal to Olecranon Process 26 cm     Olecranon Process 22.3 cm     15 cm Proximal to Ulnar Styloid Process 20.4 cm  10 cm Proximal to Ulnar Styloid Process 18.5 cm     Just Proximal to Ulnar Styloid Process 14 cm     Across Hand at [employer] 17 cm     At [place] of 2nd Digit 6 cm     At [facility] of Thumb 5.4 cm           Pt do report some neuropathy still in feet and fingers  And then numbness in am and during day with some activities in R hand  Pt negative for Tinel over CT but positive Phalens test for CT   pt ed  on modifying how she hold , grip with R hand during tasks- because of neuropathy -and positioning with sleeping, holding cell phone and cooking  Measurements look great and decrease since in April Pt do report increase lymphedema signs in R Upper thoracic - tender under arm over scars - tight with over head reaching , and then above and below scar on chest -  Pt ed on using jovipak again at night time - under camisole or bra And will recommend chest piece for pump - will contact Rep about upgrade   Will follow up in 2 wks       OT SCREEN 09/01/2020: There were no vitals filed for this visit.   Subjective Assessment - 09/01/20 1305    Subjective  The rep did call me that he should have my chest piece for my pump this week , did start the CARE program but did not go last week - moved and had swelling in my ankles - i have more pain in my back of shoulder, upper arm and front of chest on L    Currently in Pain? Yes    Pain Score 2     Pain Location --   R shoulder, chest and upper back   Pain Orientation Right    Pain Descriptors / Indicators Aching;Tightness;Burning    Pain Type Surgical pain    Pain Onset More than a month ago    Pain Frequency Intermittent           Pt arrive with reports of increase pain over R upper traps, scapula, under arm , pect and chest -pt finished radiation few months ago and OT explain to pt that it will make her more tight in chest and under arm areas. As well as her scars still adhere.  Pt need to cont with her scar mobs, and over head stretches for shoulder flexion and ABD against wall or in supine every day - so she do not compensate with deltoid and upper traps. Info provided for massages she can get here for free at the cancer center to work on R upper quadrant and scar massage. Pain can fluctuate per pt from 7/10 and decrease with pain meds to 1/10 Pt will be fitted with chest piece this week for her lymphedema pump per Rep. Will decrease  tightness and fullness in R upper quadrant. To use am and pm for 45 min to hour. Pt to cont with working out at [employer] 2 x wk - and wear her compression sleeve on R UE - and if needed knee high compression for leg swelling. Discuss with pt about energy conservation - pt compare her activity level to pre Cancer - pt to try and do other days if not CARE program - to do 22 min of activity or exercisese the other days - keep log and bring with her  to me next time. She likes to go to run errands - but cannot do what she done prior to CA - pt to schedule shorter trips and increase gradually over the weeks -and do something she enjoys every day - remind pt it is also okay to take short nap in afternoon  Pt to follow up in 3 wks                                 Patient will benefit from skilled therapeutic intervention in order to improve the following deficits and impairments:           Visit Diagnosis: Postmastectomy lymphedema syndrome  Stiffness of right shoulder, not elsewhere classified    Problem List Patient Active Problem List   Diagnosis Date Noted  . Ductal carcinoma in situ (DCIS) of left breast 03/04/2020  . Bilateral breast cancer ([place]) 10/09/2019  . Genetic testing 08/27/2019  . Family history of breast cancer   . Family history of ovarian cancer   . Family history of colon cancer   . Family history of leukemia   . Goals of care, counseling/discussion 08/07/2019  . Breast cancer ([place]) 07/30/2019  . GERD (gastroesophageal reflux disease) 06/30/2019  . Abdominal discomfort 05/15/2019  . Advance care planning 07/14/2018  . Health care maintenance 07/14/2018  . Depression, recurrent ([place]) 07/14/2018  . Constipation 07/14/2018  . Altered taste 07/14/2018  . Fibrocystic breast changes of both breasts 01/24/2018  . Vasomotor symptoms due to menopause 09/05/2017  . Herpes simplex vulvovaginitis 09/05/2017  . Skin nodule 05/25/2017  .  Encounter for screening examination for infectious disease 03/18/2017  . Lipoma 03/18/2017  . Vaginitis 03/18/2017  . Radicular pain in left arm 07/27/2015  . Neuralgia and neuritis, unspecified 07/27/2015  . Fatigue 03/05/2015  . B12 deficiency 03/05/2015  . Skin rash 05/01/2014  . MVP (mitral valve prolapse) 04/30/2014  . Cyst of breast 04/05/2014  . Interstitial cystitis 03/31/2014  . Routine general medical examination at a health care facility 11/13/2011  . Anxiety and depression 10/06/2010  . BREAST CYSTS, BILATERAL 12/03/2008  . PALPITATIONS, CHRONIC 12/03/2008  . Vitamin D deficiency 09/10/2008  . HLD (hyperlipidemia) 09/10/2008  . IRRITABLE BOWEL SYNDROME 02/12/2008    Rosalyn Gess OTR/L,CLT 09/01/2020, 1:08 PM  [facility] [address] [place], [place], [place], [address] Phone: [phone]   Fax:  [phone]  Name: Debra Little MRN: [id number] Date of Birth: [birth year]

## 2020-09-02 ENCOUNTER — Other Ambulatory Visit: Payer: Self-pay

## 2020-09-02 ENCOUNTER — Encounter: Payer: PPO | Attending: Oncology

## 2020-09-02 DIAGNOSIS — Z853 Personal history of malignant neoplasm of breast: Secondary | ICD-10-CM

## 2020-09-02 NOTE — Progress Notes (Signed)
Daily Session Note  Patient Details  Name: NIAYA HICKOK MRN: [id number] Date of Birth: 11/13/1956 Referring Provider:    Encounter Date: 09/02/2020  Check In:  Session Check In - 09/02/20 1527      Check-In   Supervising physician immediately available to respond to emergencies See telemetry face sheet for immediately available ER MD    Location [facility]    Staff Present Coralie Keens, MS Exercise Physiologist;Amanda Oletta Darter, [place], ACSM CEP, Exercise Physiologist    Virtual Visit No    Medication changes reported     No    Fall or balance concerns reported    No    Warm-up and Cool-down Performed on first and last piece of equipment    Resistance Training Performed Yes    VAD Patient? No    PAD/SET Patient? No              Social History   Tobacco Use  Smoking Status Never Smoker  Smokeless Tobacco Never Used    Goals Met:  Independence with exercise equipment Exercise tolerated well No report of cardiac concerns or symptoms Strength training completed today  Goals Unmet:  Not Applicable  Comments: Pt able to follow exercise prescription today without complaint.  Will continue to monitor for progression.   Dr. Emily Filbert is Medical Director for [place] and [facility].

## 2020-09-07 ENCOUNTER — Other Ambulatory Visit: Payer: Self-pay

## 2020-09-07 ENCOUNTER — Encounter: Payer: PPO | Admitting: *Deleted

## 2020-09-07 DIAGNOSIS — Z853 Personal history of malignant neoplasm of breast: Secondary | ICD-10-CM

## 2020-09-07 NOTE — Progress Notes (Signed)
Daily Session Note  Patient Details  Name: Debra Little MRN: [id number] Date of Birth: Nov 26, 1956 Referring Provider:    Encounter Date: 09/07/2020  Check In:  Session Check In - 09/07/20 1226      Check-In   Supervising physician immediately available to respond to emergencies See telemetry face sheet for immediately available ER MD    Location [facility]    Staff Present Alberteen Sam, MA, RCEP, CCRP, Marylynn Pearson, MS Exercise Physiologist    Medication changes reported     No    Fall or balance concerns reported    No    Warm-up and Cool-down Performed on first and last piece of equipment    Resistance Training Performed Yes    VAD Patient? No    PAD/SET Patient? No      Pain Assessment   Currently in Pain? No/denies              Social History   Tobacco Use  Smoking Status Never Smoker  Smokeless Tobacco Never Used    Goals Met:  Independence with exercise equipment Exercise tolerated well No report of cardiac concerns or symptoms Strength training completed today  Goals Unmet:  Not Applicable  Comments: Pt able to follow exercise prescription today without complaint.  Will continue to monitor for progression.    Dr. Emily Filbert is Medical Director for [place] and [facility].

## 2020-09-09 ENCOUNTER — Other Ambulatory Visit: Payer: Self-pay

## 2020-09-09 DIAGNOSIS — Z853 Personal history of malignant neoplasm of breast: Secondary | ICD-10-CM

## 2020-09-09 NOTE — Progress Notes (Signed)
Daily Session Note  Patient Details  Name: Debra Little MRN: [id number] Date of Birth: 11-15-1956 Referring Provider:    Encounter Date: 09/09/2020  Check In:      Social History   Tobacco Use  Smoking Status Never Smoker  Smokeless Tobacco Never Used    Goals Met:  Independence with exercise equipment Changing diet to healthy choices, watching portion sizes No report of cardiac concerns or symptoms Strength training completed today  Goals Unmet:  Not Applicable  Comments: Pt able to follow exercise prescription today without complaint.  Will continue to monitor for progression.    Dr. Emily Filbert is Medical Director for [place] and [facility].

## 2020-09-13 ENCOUNTER — Encounter: Payer: Self-pay | Admitting: Obstetrics and Gynecology

## 2020-09-13 ENCOUNTER — Ambulatory Visit (INDEPENDENT_AMBULATORY_CARE_PROVIDER_SITE_OTHER): Payer: PPO | Admitting: Obstetrics and Gynecology

## 2020-09-13 ENCOUNTER — Telehealth: Payer: Self-pay | Admitting: *Deleted

## 2020-09-13 ENCOUNTER — Other Ambulatory Visit: Payer: Self-pay

## 2020-09-13 VITALS — BP 100/70 | Ht 67.0 in | Wt 159.0 lb

## 2020-09-13 DIAGNOSIS — A6004 Herpesviral vulvovaginitis: Secondary | ICD-10-CM

## 2020-09-13 DIAGNOSIS — C50911 Malignant neoplasm of unspecified site of right female breast: Secondary | ICD-10-CM

## 2020-09-13 DIAGNOSIS — Z171 Estrogen receptor negative status [ER-]: Secondary | ICD-10-CM | POA: Diagnosis not present

## 2020-09-13 DIAGNOSIS — Z01419 Encounter for gynecological examination (general) (routine) without abnormal findings: Secondary | ICD-10-CM

## 2020-09-13 MED ORDER — VALACYCLOVIR HCL 500 MG PO TABS: 500.0000 mg | ORAL_TABLET | Freq: Every day | ORAL | 3 refills | Status: AC

## 2020-09-13 NOTE — Progress Notes (Signed)
PCP: Tonia Ghent, MD   Chief Complaint  Patient presents with  . Gynecologic Exam     HPI:      Debra Little is a 64 y.o. G1P1 who LMP was No LMP recorded. Patient has had a hysterectomy., presents today for Medicare annual.  Her menses are absent due to [facility] 2002 for endometriosis, and she is postmenopausal. She does not have intermenstrual bleeding. Has occas vasomotor sx. No longer on HRT due to breast cancer dx.   Sex activity: single partner, contraception - post menopausal status. She does not have vaginal dryness.  Last Pap: 09/11/18 Results were: no abnormalities ; neg HPV DNA 2015. Hx of HSV, takes valtrex QOD. No recent outbreaks. Needs Rx RF.   Last mammogram: s/p bilat mastectomy due to dx breast cancer 8/20. RT breast triple neg breast cancer 8/20, s/p chemo/radiation, done with tx now. Invitae neg except CHEK2 VUS. Followed by oncology. There is a FH of breast cancer in her mother. There is a FH of ovarian cancer in her mat aunt. Pt's ins denied cancer genetic testing in the past.   Colonoscopy: colonoscopy 3 years ago with abnormalities. Repeat due after 5 years. Sees Dr. [name]. DEXA: 4/21 at [facility]--osteopenia in spine/fem neck. Doing ca/Vit D.   Labs with PCP.  Past Medical History:  Diagnosis Date  . Anemia    d/t chemo - last Hgb 10.8  . Anxiety    sees Dr. [name]  . BRCA negative    Invitae panel neg except CHEK2 VUS  . Breast cancer ([place]) 2020  . Cyst of breast    per [name]  . Dysrhythmia    Hx of palpitations  . Family history of breast cancer   . Family history of colon cancer   . Family history of leukemia   . Family history of ovarian cancer   . Fibromyalgia   . GERD (gastroesophageal reflux disease)   . Heart murmur   . Hemorrhoids   . Herpes, genital 04/2015   confirmed with HSV 2 IgG  . Hiatal hernia   . Hypercholesterolemia    Dr. Kyra Searles  . Hyperlipidemia   . IBS (irritable bowel syndrome)    constipation  predominant  . IC (interstitial cystitis)    per [facility]  . Mild depression ([place])   . MVP (mitral valve prolapse)    [facility] cards eval 2012  . Osteopenia    2010/2017, DEXA at [facility];spine and fem neck  . PONV (postoperative nausea and vomiting)   . Vitamin D deficiency    history of    Past Surgical History:  Procedure Laterality Date  . ABDOMINAL HYSTERECTOMY    . APPENDECTOMY    . BLADDER SURGERY     1980's  . BREAST EXCISIONAL BIOPSY Right    benign  . BREAST EXCISIONAL BIOPSY Bilateral    benign  . COLONOSCOPY  09/2014   Dr. [name]  . COLONOSCOPY  2009   at [facility] 1 POLYP (BENIGN)  . excision of breast cysts     hx of multiple cyst aspirations  . EXPLORATORY LAPAROTOMY    . MASTECTOMY MODIFIED RADICAL Bilateral 10/09/2019   Procedure: RIGHT MASTECTOMY MODIFIED RADICAL AND LEFT TOTAL MASTECTOMY;  Surgeon: Rolm Bookbinder, MD;  Location: [place];  Service: General;  Laterality: Bilateral;  . PORTA CATH INSERTION N/A 08/06/2019   Procedure: PORTA CATH INSERTION;  Surgeon: Katha Cabal, MD;  Location: [place] [facility];  Service: Cardiovascular;  Laterality:  N/A;  . PORTACATH PLACEMENT Left 08/05/2019   Procedure: INSERTION PORT-A-CATH, Attempted;  Surgeon: Jules Husbands, MD;  Location: [facility];  Service: General;  Laterality: Left;  . TUBAL LIGATION      Family History  Problem Relation Age of Onset  . Breast cancer Mother 64  . Hypertension Mother   . Colon cancer Father 20  . Hyperlipidemia Father   . Ovarian cancer Maternal Aunt 80  . Breast cancer Cousin   . Gallbladder disease Paternal Grandmother   . Liver cancer Cousin 70       Malignant  . Cancer Maternal Uncle        unsure type    Social History   Socioeconomic History  . Marital status: Divorced    Spouse name: Not on file  . Number of children: 1  . Years of education: Not on file  . Highest education level: Not on file  Occupational History  . Occupation: [employer]  Tobacco Use    . Smoking status: Never Smoker  . Smokeless tobacco: Never Used  Vaping Use  . Vaping Use: Never used  Substance and Sexual Activity  . Alcohol use: No    Alcohol/week: 0.0 standard drinks  . Drug use: No  . Sexual activity: Yes    Birth control/protection: Surgical    Comment: Hysterectomy  Other Topics Concern  . Not on file  Social History Narrative   Married in 02-22-97, divorced as of February 22, 2017, 1 son from previous relationship   Brother with MVA at 31, in rest home for many years as of 02-22-18- died of covid recently   Social Determinants of Radio broadcast assistant Strain: [place]   . Difficulty of Paying Living Expenses: Not hard at all  Food Insecurity: No Food Insecurity  . Worried About Charity fundraiser in the Last Year: Never true  . Ran Out of Food in the Last Year: Never true  Transportation Needs: No Transportation Needs  . Lack of Transportation (Medical): No  . Lack of Transportation (Non-Medical): No  Physical Activity: Inactive  . Days of Exercise per Week: 0 days  . Minutes of Exercise per Session: 0 min  Stress: No Stress Concern Present  . Feeling of Stress : Not at all  Social Connections:   . Frequency of Communication with Friends and Family: Not on file  . Frequency of Social Gatherings with Friends and Family: Not on file  . Attends Religious Services: Not on file  . Active Member of Clubs or Organizations: Not on file  . Attends Archivist Meetings: Not on file  . Marital Status: Not on file  Intimate Partner Violence: Not At Risk  . Fear of Current or Ex-Partner: No  . Emotionally Abused: No  . Physically Abused: No  . Sexually Abused: No   Current Meds  Medication Sig  . acetaminophen (TYLENOL) 650 MG CR tablet Take 650 mg by mouth every 8 (eight) hours as needed for pain.  . Ascorbic Acid (VITAMIN C) 100 MG tablet Take 100 mg by mouth daily.  [name] atorvastatin (LIPITOR) 40 MG tablet Take 40 mg by mouth at bedtime.   . Calcium  Carbonate-Vit D-Min (CALCIUM 1200 PO) Take 4 tablets by mouth daily.  . cholecalciferol (VITAMIN D3) 25 MCG (1000 UNIT) tablet Take 1 tablet (1,000 Units total) by mouth daily.  . clonazePAM (KLONOPIN) 0.5 MG tablet Take 0.5 mg by mouth 2 (two) times daily.   [name] Dexlansoprazole 30 MG  capsule Take 1 capsule (30 mg total) by mouth daily.  . DULoxetine (CYMBALTA) 60 MG capsule Take 60 mg by mouth daily.   . furosemide (LASIX) 20 MG tablet Take 1 tablet (20 mg total) by mouth every other day.  . gabapentin (NEURONTIN) 100 MG capsule Take 1-2 capsules (100-200 mg total) by mouth 3 (three) times daily as needed.  . lidocaine-prilocaine (EMLA) cream APPLY TO AFFECTED AREAS ONCE AS DIRECTED  . loratadine (CLARITIN) 10 MG tablet Take 1 tablet (10 mg total) by mouth daily.  . ondansetron (ZOFRAN) 8 MG tablet Take 1 tablet (8 mg total) by mouth 2 (two) times daily as needed. Start on the third day after chemotherapy.  [name] oxyCODONE (OXY IR/ROXICODONE) 5 MG immediate release tablet Take 1 tablet (5 mg total) by mouth every 6 (six) hours as needed for severe pain.  [name] Plecanatide (TRULANCE) 3 MG TABS Take 3 mg by mouth daily.   . sennosides-docusate sodium (SENOKOT-S) 8.6-50 MG tablet Take 1-2 tablets by mouth at bedtime.   . sucralfate (CARAFATE) 1 g tablet Take 1 g by mouth 3 (three) times daily.   . valACYclovir (VALTREX) 500 MG tablet Take 1 tablet (500 mg total) by mouth daily.  . vitamin B-12 (CYANOCOBALAMIN) 500 MCG tablet Take 500 mcg by mouth daily.  . [DISCONTINUED] valACYclovir (VALTREX) 500 MG tablet Take 1 tablet (500 mg total) by mouth daily.    ROS:  Review of Systems  Constitutional: Positive for fatigue. Negative for fever and unexpected weight change.  Respiratory: Negative for cough, shortness of breath and wheezing.   Cardiovascular: Negative for chest pain, palpitations and leg swelling.  Gastrointestinal: Positive for constipation. Negative for blood in stool, diarrhea, nausea and  vomiting.  Endocrine: Negative for cold intolerance, heat intolerance and polyuria.  Genitourinary: Positive for frequency. Negative for dyspareunia, dysuria, flank pain, genital sores, hematuria, menstrual problem, pelvic pain, urgency, vaginal bleeding, vaginal discharge and vaginal pain.  Musculoskeletal: Positive for arthralgias. Negative for back pain, joint swelling and myalgias.  Skin: Negative for rash.  Neurological: Negative for dizziness, syncope, light-headedness, numbness and headaches.  Hematological: Negative for adenopathy.  Psychiatric/Behavioral: Positive for agitation. Negative for confusion, dysphoric mood, sleep disturbance and suicidal ideas. The patient is not nervous/anxious.      Objective: BP 100/70   Ht $R'5\' 7"'lk$  (1.702 m)   Wt 159 lb (72.1 kg)   BMI 24.90 kg/m    Physical Exam Constitutional:      Appearance: She is well-developed.  Genitourinary:     Vulva and vagina normal.     No vaginal discharge, erythema or tenderness.     Cervix is absent.     Uterus is absent.     No right or left adnexal mass present.     Right adnexa absent.     Right adnexa not tender.     Left adnexa absent.     Left adnexa not tender.     Genitourinary Comments: UTERUS/CX SURG REM  Neck:     Thyroid: No thyromegaly.  Cardiovascular:     Rate and Rhythm: Normal rate and regular rhythm.     Heart sounds: Normal heart sounds. No murmur heard.   Pulmonary:     Effort: Pulmonary effort is normal.     Breath sounds: Normal breath sounds.  Chest:     Breasts:        Right: Absent. No skin change.        Left: Absent. No skin change.  Comments: BILAT BREASTS REMOVED Abdominal:     Palpations: Abdomen is soft.     Tenderness: There is no abdominal tenderness. There is no guarding.  Musculoskeletal:        General: Normal range of motion.     Cervical back: Normal range of motion.  Neurological:     General: No focal deficit present.     Mental Status: She is alert  and oriented to person, place, and time.     Cranial Nerves: No cranial nerve deficit.  Skin:    General: Skin is warm and dry.  Psychiatric:        Mood and Affect: Mood normal.        Behavior: Behavior normal.        Thought Content: Thought content normal.        Judgment: Judgment normal.  Vitals reviewed.     Assessment/Plan:  Encounter for annual routine gynecological examination  Malignant neoplasm of right breast in female, estrogen receptor negative, unspecified site of breast (HCC)--pt followed by oncology. Completed tx.  Herpes simplex vulvovaginitis - Plan: valACYclovir (VALTREX) 500 MG tablet; Rx RF. Discussed taking episodically vs daily. F/u prn.    Meds ordered this encounter  Medications  . valACYclovir (VALTREX) 500 MG tablet    Sig: Take 1 tablet (500 mg total) by mouth daily.    Dispense:  90 tablet    Refill:  3    Order Specific Question:   Supervising Provider    Answer:   Gae Dry [[id number]]            GYN counsel calcium and vitamin D.    F/U  Return in about 2 years (around 09/13/2022).  Brantley Naser B. Tavonte Seybold, PA-C 09/13/2020 4:07 PM

## 2020-09-13 NOTE — Telephone Encounter (Signed)
Patient opts to see Dr Janese Banks tomorrow afternoon. Appointment accepted for 230 tomorrow

## 2020-09-13 NOTE — Telephone Encounter (Signed)
Patient called reporting increased pain and "fluid" in her right chest where she had lymph nodes removed. She is asking to be evaluated today after her appointment at 130 with PCP stating she should be done by 230 with that appointment. Please advise

## 2020-09-13 NOTE — Telephone Encounter (Signed)
I can see her tomorrow or she can see [facility] today if they have an opening

## 2020-09-13 NOTE — Patient Instructions (Signed)
I value your feedback and entrusting us with your care. If you get a [facility] patient survey, I would appreciate you taking the time to let us know about your experience today. Thank you!  As of November 06, 2019, your lab results will be released to your MyChart immediately, before I even have a chance to see them. Please give me time to review them and contact you if there are any abnormalities. Thank you for your patience.  

## 2020-09-14 ENCOUNTER — Encounter: Payer: PPO | Admitting: *Deleted

## 2020-09-14 ENCOUNTER — Inpatient Hospital Stay (HOSPITAL_BASED_OUTPATIENT_CLINIC_OR_DEPARTMENT_OTHER): Payer: PPO | Admitting: Oncology

## 2020-09-14 ENCOUNTER — Encounter: Payer: Self-pay | Admitting: Oncology

## 2020-09-14 VITALS — BP 129/63 | HR 70 | Temp 97.3°F | Resp 18 | Wt 158.9 lb

## 2020-09-14 DIAGNOSIS — R0789 Other chest pain: Secondary | ICD-10-CM

## 2020-09-14 DIAGNOSIS — Z853 Personal history of malignant neoplasm of breast: Secondary | ICD-10-CM

## 2020-09-14 DIAGNOSIS — R222 Localized swelling, mass and lump, trunk: Secondary | ICD-10-CM | POA: Diagnosis not present

## 2020-09-14 DIAGNOSIS — Z9013 Acquired absence of bilateral breasts and nipples: Secondary | ICD-10-CM | POA: Diagnosis not present

## 2020-09-14 MED ORDER — PREDNISONE 10 MG PO TABS: 10.0000 mg | ORAL_TABLET | Freq: Every day | ORAL | 0 refills | Status: DC

## 2020-09-14 NOTE — Progress Notes (Signed)
Daily Session Note  Patient Details  Name: Debra Little MRN: [id number] Date of Birth: 1956-06-07 Referring Provider:    Encounter Date: 09/14/2020  Check In:  Session Check In - 09/14/20 1243      Check-In   Supervising physician immediately available to respond to emergencies See telemetry face sheet for immediately available ER MD    Location [facility]    Staff Present Alberteen Sam, MA, RCEP, CCRP, Marylynn Pearson, MS Exercise Physiologist    Virtual Visit No    Medication changes reported     No    Fall or balance concerns reported    No    Warm-up and Cool-down Performed on first and last piece of equipment    Resistance Training Performed Yes    VAD Patient? No    PAD/SET Patient? No      Pain Assessment   Currently in Pain? No/denies              Social History   Tobacco Use  Smoking Status Never Smoker  Smokeless Tobacco Never Used    Goals Met:  Independence with exercise equipment Exercise tolerated well No report of cardiac concerns or symptoms Strength training completed today  Goals Unmet:  Not Applicable  Comments: Pt able to follow exercise prescription today without complaint.  Will continue to monitor for progression.    Dr. Emily Filbert is Medical Director for [facility] and [facility].

## 2020-09-16 ENCOUNTER — Other Ambulatory Visit: Payer: Self-pay

## 2020-09-16 DIAGNOSIS — Z853 Personal history of malignant neoplasm of breast: Secondary | ICD-10-CM

## 2020-09-16 NOTE — Progress Notes (Addendum)
Daily Session Note  Patient Details  Name: Debra Little MRN: [id number] Date of Birth: 02/13/1956 Referring Provider:    Encounter Date: 09/16/2020  Check In:  Session Check In - 09/16/20 1241      Check-In   Supervising physician immediately available to respond to emergencies See telemetry face sheet for immediately available ER MD    Location [facility]    Staff Present Amanda Sommer, BA, ACSM CEP, Exercise Physiologist; , MS Exercise Physiologist    Virtual Visit No    Medication changes reported     Yes    Comments On prednisone    Fall or balance concerns reported    No    Warm-up and Cool-down Performed on first and last piece of equipment    Resistance Training Performed Yes    VAD Patient? No              Social History   Tobacco Use  Smoking Status Never Smoker  Smokeless Tobacco Never Used    Goals Met:  Independence with exercise equipment Exercise tolerated well No report of cardiac concerns or symptoms Strength training completed today  Goals Unmet:  Not Applicable  Comments: Pt able to follow exercise prescription today without complaint.  Will continue to monitor for progression.   Dr. Mark Miller is Medical Director for [facility] and [facility]. 

## 2020-09-17 NOTE — Progress Notes (Signed)
Hematology/Oncology Consult note [facility]  Telephone:([phone] [phone]  Patient Care Team: Tonia Ghent, MD as PCP - General (Family Medicine) Bary Castilla, Forest Gleason, MD (General Surgery) Modesto Charon, MD (Family Medicine) Laneta Simmers as Physician Assistant (Urology) [name] Javier Docker, MD as Consulting Physician (Cardiology) Sindy Guadeloupe, MD as Consulting Physician (Hematology and Oncology) Noreene Filbert, MD as Referring Physician (Radiation Oncology)   Name of the patient: Debra Little  [id number]  1956-08-19   Date of visit: 09/17/20  Diagnosis- clinical prognostic stage 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 in the past and has undergone breast biopsies which did not previously showed malignancy. More recently patient noted some red discoloration around her right perioral as well as a palpable mass which led to a diagnostic mammogram on the right side her prior mammogram in February 2020 was normal in August 2020 she was noted to have a 2.7 x 2 x 2.3 cm mass in her right breast along with 4 morphologically abnormal lymph nodes. She underwent breast biopsy as well as lymph node biopsy which showed grade 3 invasive mammary carcinoma. ER PR and HER-2/neunegative.Sections show high-grade invasive carcinoma with focal squamous differentiation and pinpoint keratin ideation. The features are concerning for metaplastic differentiation.[name] Lymphovascular invasion present. There was extranodal extension present on the lymph node biopsy.   Her family history is significant for breast cancer in her mother in her 55s. Father had colon cancer in his70s.Family history also significant for ovarian cancer in her maternal aunt.  MRI of bilateral  breasts showed: Primary right breast mass was 3.5 x 2.7 x 3.4 cm. There were surrounding numerous nodules consistent with satellite lesions. Extensive nodular non-mass enhancement throughout the right breast involving the upper and lower quadrants spanning 6.2 x 4.5 x 5.5 cm. Markedly enhancement of the left breast diffusely. Non-mass enhancement measures 1.9 x 2.4 cm. At least 3 abnormal lymph nodes in the right axilla. No abnormal left axillary lymph nodes. 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 Equivocal inferior breast and left axillary nodal hypermetabolism. No evidence of extrathoracic hypermetabolic metastases. Lateral right breast primary with right axillary nodal metastases  Patient had 3 cycles of neoadjuvant AC chemotherapy along with Keytruda. There was no significant response in her tumor and patient proceeded with bilateral mastectomy with targeted node dissection. 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 7 lymph nodes were positive for malignancy with extranodal extension overall cancer cellularity was 40% [id number].Patient completed 1 cycle of adjuvant AC chemotherapy and willcomplete weekly Taxol chemotherapy in April 2021. She completed post mastectomy radiation  Interval history-patient reports having dull aching and sometimes burning pain over her right chest wall which also radiates down her right arm.  She has been seeing Gwenette Greet for lymphedema management and states that it helps some but the pain has not responded significantly.  She has been taking close to 3 g of Tylenol and 2 Motrin's daily to counter the pain  ECOG PS- 1 Pain scale- 6   Review of systems- Review of Systems  Constitutional: Positive for malaise/fatigue. Negative for chills, fever and weight loss.  HENT: Negative for congestion, ear  discharge and nosebleeds.  Eyes: Negative for blurred vision.  Respiratory: Negative for cough, hemoptysis, sputum production, shortness of breath and wheezing.   Cardiovascular: Negative for chest pain, palpitations, orthopnea and claudication.       Right chest wall pain and upper extremity swelling  Gastrointestinal: Negative for abdominal pain, blood in stool, constipation, diarrhea, heartburn, melena, nausea and vomiting.  Genitourinary: Negative for dysuria, flank pain, frequency, hematuria and urgency.  Musculoskeletal: Negative for back pain, joint pain and myalgias.  Skin: Negative for rash.  Neurological: Negative for dizziness, tingling, focal weakness, seizures, weakness and headaches.  Endo/Heme/Allergies: Does not bruise/bleed easily.  Psychiatric/Behavioral: Negative for depression and suicidal ideas. The patient does not have insomnia.       Allergies  Allergen Reactions  . Amitiza [Lubiprostone] Other (See Comments)    Overly effective at higher dose  . Bentyl [Dicyclomine Hcl]     Lack of effect  . Cortisone Other (See Comments)    flush  . Mobic [Meloxicam]     Palpitations.  But can tolerate aleve  . Nitrofurantoin Monohyd Macro   . Sulfonamide Derivatives Nausea Only  . Zoloft [Sertraline Hcl] Diarrhea and Nausea Only  . Buspar [Buspirone] Palpitations  . Celecoxib Rash    REACTION: unspecified  . Doxycycline Palpitations    chest pain  . Penicillins Rash     Past Medical History:  Diagnosis Date  . Anemia    d/t chemo - last Hgb 10.8  . Anxiety    sees Dr. [name]  . BRCA negative    [employer]ae panel neg except CHEK2 VUS  . Breast cancer ([place]) 2020  . Cyst of breast    per [name]  . Dysrhythmia    Hx of palpitations  . Family history of breast cancer   . Family history of colon cancer   . Family history of leukemia   . Family history of ovarian cancer   . Fibromyalgia   . GERD (gastroesophageal reflux disease)   . Heart murmur   .  Hemorrhoids   . Herpes, genital 04/2015   confirmed with HSV 2 IgG  . Hiatal hernia   . Hypercholesterolemia    Dr. Kyra Searles  . Hyperlipidemia   . IBS (irritable bowel syndrome)    constipation predominant  . IC (interstitial cystitis)    per [facility]  . Mild depression ([place])   . MVP (mitral valve prolapse)    [facility] cards eval 2012  . Osteopenia    2010/2017, DEXA at [facility];spine and fem neck  . PONV (postoperative nausea and vomiting)   . Vitamin D deficiency    history of     Past Surgical History:  Procedure Laterality Date  . ABDOMINAL HYSTERECTOMY    . APPENDECTOMY    . BLADDER SURGERY     1980's  . BREAST EXCISIONAL BIOPSY Right    benign  . BREAST EXCISIONAL BIOPSY Bilateral    benign  . COLONOSCOPY  09/2014   Dr. [name]  . COLONOSCOPY  2009   at [facility] 1 POLYP (BENIGN)  . excision of breast cysts     hx of multiple cyst aspirations  . EXPLORATORY LAPAROTOMY    . MASTECTOMY MODIFIED RADICAL Bilateral 10/09/2019   Procedure: RIGHT MASTECTOMY MODIFIED RADICAL AND LEFT TOTAL MASTECTOMY;  Surgeon: Rolm Bookbinder, MD;  Location: [place];  Service: General;  Laterality: Bilateral;  . PORTA CATH INSERTION N/A 08/06/2019   Procedure: PORTA CATH INSERTION;  Surgeon: Katha Cabal, MD;  Location: [place] [facility];  Service: Cardiovascular;  Laterality: N/A;  . PORTACATH PLACEMENT Left 08/05/2019   Procedure: INSERTION PORT-A-CATH, Attempted;  Surgeon: Jules Husbands, MD;  Location: [facility];  Service: General;  Laterality: Left;  . TUBAL LIGATION      Social History   Socioeconomic History  . Marital status: Divorced    Spouse name: Not on file  . Number of children: 1  . Years of education: Not on file  . Highest education level: Not on file  Occupational History  . Occupation: [employer]  Tobacco Use  . Smoking status: Never Smoker  . Smokeless tobacco: Never Used  Vaping Use  . Vaping Use: Never used  Substance and Sexual Activity  . Alcohol use:  No    Alcohol/week: 0.0 standard drinks  . Drug use: No  . Sexual activity: Yes    Birth control/protection: Surgical    Comment: Hysterectomy  Other Topics Concern  . Not on file  Social History Narrative   Married in Jan 26, 1997, divorced as of January 26, 2017, 1 son from previous relationship   Brother with MVA at 85, in rest home for many years as of 01-26-18- died of covid recently   Social Determinants of Radio broadcast assistant Strain: [place]   . Difficulty of Paying Living Expenses: Not hard at all  Food Insecurity: No Food Insecurity  . Worried About Charity fundraiser in the Last Year: Never true  . Ran Out of Food in the Last Year: Never true  Transportation Needs: No Transportation Needs  . Lack of Transportation (Medical): No  . Lack of Transportation (Non-Medical): No  Physical Activity: Inactive  . Days of Exercise per Week: 0 days  . Minutes of Exercise per Session: 0 min  Stress: No Stress Concern Present  . Feeling of Stress : Not at all  Social Connections:   . Frequency of Communication with Friends and Family: Not on file  . Frequency of Social Gatherings with Friends and Family: Not on file  . Attends Religious Services: Not on file  . Active Member of Clubs or Organizations: Not on file  . Attends Archivist Meetings: Not on file  . Marital Status: Not on file  Intimate Partner Violence: Not At Risk  . Fear of Current or Ex-Partner: No  . Emotionally Abused: No  . Physically Abused: No  . Sexually Abused: No    Family History  Problem Relation Age of Onset  . Breast cancer Mother 55  . Hypertension Mother   . Colon cancer Father 50  . Hyperlipidemia Father   . Ovarian cancer Maternal Aunt 80  . Breast cancer Cousin   . Gallbladder disease Paternal Grandmother   . Liver cancer Cousin 70       Malignant  . Cancer Maternal Uncle        unsure type     Current Outpatient Medications:  .  acetaminophen (TYLENOL) 650 MG CR tablet, Take 650 mg by  mouth every 8 (eight) hours as needed for pain., Disp: , Rfl:  .  Ascorbic Acid (VITAMIN C) 100 MG tablet, Take 100 mg by mouth daily., Disp: , Rfl:  .  atorvastatin (LIPITOR) 40 MG tablet, Take 40 mg by mouth at bedtime. , Disp: , Rfl: 1 .  Calcium Carbonate-Vit D-Min (CALCIUM 1200 PO), Take 4 tablets by mouth daily., Disp: , Rfl:  .  cholecalciferol (VITAMIN D3) 25 MCG (1000 UNIT) tablet, Take 1 tablet (1,000 Units total) by mouth daily., Disp: ,  Rfl:  .  clonazePAM (KLONOPIN) 0.5 MG tablet, Take 0.5 mg by mouth 2 (two) times daily. , Disp: , Rfl:  .  Dexlansoprazole 30 MG capsule, Take 1 capsule (30 mg total) by mouth daily., Disp: 30 capsule, Rfl: 6 .  DULoxetine (CYMBALTA) 60 MG capsule, Take 60 mg by mouth daily. , Disp: , Rfl:  .  furosemide (LASIX) 20 MG tablet, Take 1 tablet (20 mg total) by mouth every other day., Disp: , Rfl:  .  gabapentin (NEURONTIN) 100 MG capsule, Take 1-2 capsules (100-200 mg total) by mouth 3 (three) times daily as needed., Disp: 90 capsule, Rfl: 1 .  HYDROcodone-acetaminophen (NORCO/VICODIN) 5-325 MG tablet, Take 1 tablet by mouth every 6 (six) hours as needed for moderate pain., Disp: 15 tablet, Rfl: 0 .  lidocaine-prilocaine (EMLA) cream, APPLY TO AFFECTED AREAS ONCE AS DIRECTED, Disp: 30 g, Rfl: 3 .  loratadine (CLARITIN) 10 MG tablet, Take 1 tablet (10 mg total) by mouth daily., Disp: , Rfl:  .  ondansetron (ZOFRAN) 8 MG tablet, Take 1 tablet (8 mg total) by mouth 2 (two) times daily as needed. Start on the third day after chemotherapy., Disp: 30 tablet, Rfl: 1 .  oxyCODONE (OXY IR/ROXICODONE) 5 MG immediate release tablet, Take 1 tablet (5 mg total) by mouth every 6 (six) hours as needed for severe pain., Disp: 30 tablet, Rfl: 0 .  Plecanatide (TRULANCE) 3 MG TABS, Take 3 mg by mouth daily. , Disp: , Rfl:  .  sennosides-docusate sodium (SENOKOT-S) 8.6-50 MG tablet, Take 1-2 tablets by mouth at bedtime. , Disp: , Rfl:  .  sucralfate (CARAFATE) 1 g tablet, Take  1 g by mouth 3 (three) times daily. , Disp: , Rfl:  .  valACYclovir (VALTREX) 500 MG tablet, Take 1 tablet (500 mg total) by mouth daily., Disp: 90 tablet, Rfl: 3 .  vitamin B-12 (CYANOCOBALAMIN) 500 MCG tablet, Take 500 mcg by mouth daily., Disp: , Rfl:  .  predniSONE (DELTASONE) 10 MG tablet, Take 1 tablet (10 mg total) by mouth daily with breakfast., Disp: 14 tablet, Rfl: 0  Physical exam:  Vitals:   09/14/20 1446  BP: 129/63  Pulse: 70  Resp: 18  Temp: (!) 97.3 F (36.3 C)  TempSrc: Tympanic  SpO2: 100%  Weight: 158 lb 14.4 oz (72.1 kg)   Physical Exam Constitutional:      General: She is not in acute distress. Cardiovascular:     Rate and Rhythm: Normal rate and regular rhythm.     Heart sounds: Normal heart sounds.  Pulmonary:     Effort: Pulmonary effort is normal.     Breath sounds: Normal breath sounds.  Abdominal:     General: Bowel sounds are normal.     Palpations: Abdomen is soft.  Skin:    General: Skin is warm and dry.  Neurological:     Mental Status: She is alert and oriented to person, place, and time.   Chest wall exam: No evidence of chest wall recurrence.  No palpable masses or right axillary adenopathy.  There is no significant increase in right upper extremity girth as compared to left upper extremity.  CMP Latest Ref Rng & Units 08/10/2020  Glucose 70 - 99 mg/dL 124(H)  BUN 8 - 23 mg/dL 14  Creatinine 0.44 - 1.00 mg/dL 0.72  Sodium 135 - 145 mmol/L 137  Potassium 3.5 - 5.1 mmol/L 3.8  Chloride 98 - 111 mmol/L 101  CO2 22 - 32 mmol/L 28  Calcium  8.9 - 10.3 mg/dL 8.7(L)  Total Protein 6.5 - 8.1 g/dL 7.1  Total Bilirubin 0.3 - 1.2 mg/dL 0.6  Alkaline Phos 38 - 126 U/L 66  AST 15 - 41 U/L 18  ALT 0 - 44 U/L 13   CBC Latest Ref Rng & Units 08/10/2020  WBC 4.0 - 10.5 K/uL 9.1  Hemoglobin 12.0 - 15.0 g/dL 11.6(L)  Hematocrit 36 - 46 % 34.8(L)  Platelets 150 - 400 K/uL 236     Assessment and plan- Patient is a 64 y.o. female withclinical  prognostic stage IIIb cT2 cN1 M0 triple negative invasive mammary carcinoma of the right breast.She is s/p 3 cycles of neoadjuvant AC Keytruda chemotherapy followed by bilateral mastectomy and targeted node dissection due tonosignificant response to neoadjuvant chemotherapy.She completed dd ACT followed by radiation to chest wall adjuvantlyShe went on to get bilateral mastectomy and node dissection which showed stage IIIc diseasepT 2pN2 a. This is an acute visit for ongoing right chest wall pain  Based on clinical exam there is no concern for breast cancer recurrence that would explain the right chest wall pain.  She also had a CT chest abdomen and pelvis with contrast in August 2021 which did not reveal any evidence of recurrent carcinoma in chest abdomen and pelvis. Based on her signs and symptoms clinically this appears to be secondary to postmastectomy lymphedema.  I will give her a short course of steroids to alleviate her pain but she understands that that is not a long-term option.  I also do not want her to be taking close to 3 g of Tylenol and daily Motrin.  Discussed restarting gabapentin to help with neuropathic component of postmastectomy pain.  She was also given as needed oxycodone in September 2021 which she uses sparingly.  I have encouraged her to keep her follow-up appointments with Luna Fuse as well.  If she continues to have worsening pain I will consider getting repeat imaging but I do not see much of a clinical utility in doing that at this time   Visit Diagnosis 1. Chest wall pain      Dr. Randa Evens, MD, MPH [facility] [phone] 09/17/2020 12:29 PM

## 2020-09-21 ENCOUNTER — Encounter: Payer: PPO | Admitting: *Deleted

## 2020-09-21 ENCOUNTER — Other Ambulatory Visit: Payer: Self-pay

## 2020-09-21 DIAGNOSIS — Z853 Personal history of malignant neoplasm of breast: Secondary | ICD-10-CM

## 2020-09-21 NOTE — Progress Notes (Signed)
Daily Session Note  Patient Details  Name: Debra Little MRN: [id number] Date of Birth: Oct 30, 1956 Referring Provider:    Encounter Date: 09/21/2020  Check In:  Session Check In - 09/21/20 1227      Check-In   Supervising physician immediately available to respond to emergencies See telemetry face sheet for immediately available ER MD    Location [facility]    Staff Present Alberteen Sam, MA, RCEP, CCRP, Marylynn Pearson, [place] Exercise Physiologist    Virtual Visit No    Medication changes reported     No    Fall or balance concerns reported    No    Warm-up and Cool-down Performed on first and last piece of equipment    Resistance Training Performed Yes    VAD Patient? No    PAD/SET Patient? No      Pain Assessment   Currently in Pain? No/denies              Social History   Tobacco Use  Smoking Status Never Smoker  Smokeless Tobacco Never Used    Goals Met:  Proper associated with RPD/PD & O2 Sat Independence with exercise equipment Exercise tolerated well No report of cardiac concerns or symptoms Strength training completed today  Goals Unmet:  Not Applicable  Comments: Pt able to follow exercise prescription today. Patient complained of increased pain associated with lymphedema in right arm. No fever, redness or swelling was presented. Patient was advised to avoid using affected arm during exercise until seen by a provider. Patient has appointment tomorrow with OT and will keep staff updated.  Will continue to monitor for progression.     Dr. Emily Filbert is Medical Director for [place] [facility].

## 2020-09-22 ENCOUNTER — Other Ambulatory Visit: Payer: Self-pay

## 2020-09-22 ENCOUNTER — Inpatient Hospital Stay: Payer: PPO | Admitting: Occupational Therapy

## 2020-09-22 DIAGNOSIS — R0789 Other chest pain: Secondary | ICD-10-CM

## 2020-09-22 DIAGNOSIS — M25611 Stiffness of right shoulder, not elsewhere classified: Secondary | ICD-10-CM

## 2020-09-22 NOTE — Therapy (Addendum)
[facility] [address] [place], [place], [place], [address] Phone: [phone]   Fax:  [phone]  Occupational Therapy Screen  Patient Details  Name: Debra Little MRN: [id number] Date of Birth: 27-Dec-1955 Referring Provider (OT): Dr Janese Banks   Encounter Date: 09/22/2020   OT End of Session - 09/22/20 1722    Visit Number 0           Past Medical History:  Diagnosis Date  . Anemia    d/t chemo - last Hgb 10.8  . Anxiety    sees Dr. [name]  . BRCA negative    Invitae panel neg except CHEK2 VUS  . Breast cancer ([place]) 2020  . Cyst of breast    per [name]  . Dysrhythmia    Hx of palpitations  . Family history of breast cancer   . Family history of colon cancer   . Family history of leukemia   . Family history of ovarian cancer   . Fibromyalgia   . GERD (gastroesophageal reflux disease)   . Heart murmur   . Hemorrhoids   . Herpes, genital 04/2015   confirmed with HSV 2 IgG  . Hiatal hernia   . Hypercholesterolemia    Dr. Kyra Searles  . Hyperlipidemia   . IBS (irritable bowel syndrome)    constipation predominant  . IC (interstitial cystitis)    per [facility]  . Mild depression ([place])   . MVP (mitral valve prolapse)    [facility] cards eval 2012  . Osteopenia    2010/2017, DEXA at [facility];spine and fem neck  . PONV (postoperative nausea and vomiting)   . Vitamin D deficiency    history of    Past Surgical History:  Procedure Laterality Date  . ABDOMINAL HYSTERECTOMY    . APPENDECTOMY    . BLADDER SURGERY     1980's  . BREAST EXCISIONAL BIOPSY Right    benign  . BREAST EXCISIONAL BIOPSY Bilateral    benign  . COLONOSCOPY  09/2014   Dr. [name]  . COLONOSCOPY  2009   at [facility] 1 POLYP (BENIGN)  . excision of breast cysts     hx of multiple cyst aspirations  . EXPLORATORY LAPAROTOMY    . MASTECTOMY MODIFIED RADICAL Bilateral 10/09/2019   Procedure: RIGHT MASTECTOMY MODIFIED RADICAL AND LEFT  TOTAL MASTECTOMY;  Surgeon: Rolm Bookbinder, MD;  Location: [facility];  Service: General;  Laterality: Bilateral;  . PORTA CATH INSERTION N/A 08/06/2019   Procedure: PORTA CATH INSERTION;  Surgeon: Katha Cabal, MD;  Location: [facility];  Service: Cardiovascular;  Laterality: N/A;  . PORTACATH PLACEMENT Left 08/05/2019   Procedure: INSERTION PORT-A-CATH, Attempted;  Surgeon: Jules Husbands, MD;  Location: [facility];  Service: General;  Laterality: Left;  . TUBAL LIGATION      There were no vitals filed for this visit.   Subjective Assessment - 09/22/20 1719    Subjective  I still have pain under my R arm, chest, over shoulder blade and upper traps - it can go up to 8/10 - and I had to cancel the massage therapist the other day - my next appt is not until middle Nov- I wish Dr Janese Banks will do scan - I am just worried    Currently in Pain? Yes    Pain Score 8     Pain Location --   R axilla, scapula and chest   Pain Orientation Right    Pain  Descriptors / Indicators Aching;Tender;Tightness;Burning;Stabbing;Shooting    Pain Type Surgical pain    Pain Onset More than a month ago    Pain Frequency Intermittent             Pt follow up with concerns of continues pain in R upper quadrant because of her lymphedema- Education again done with pt - that her measurements looks great and her lymphedema is under control - with using compression sleeve and glove with hight risk activities -and working out -and then [employer] during day or night as needed to clear any lymphedema in upper thoracic - even got her pump with chest piece  Her pain cont to be over R upper traps, scapula, deltoid, under arm , pect  Major into chest -pt finished radiation few months ago and OT explain to pt that she looked great prior to radiation - but had more sedentary life style - and did not keep up with her ROM HEP - and chest and under arm got tight. Scar adhesion improved greatly- but pect major tight and  tender  Reinforce this date and showed pect stretches - in supine on towel roll - for 3-5 min arms to side -and then gradually work into get arm over head but on pillow over the next to weeks Also pect stretch on wall in corner - 10 reps hold 5 sec - step gentle into stretch- do not hang into pect stretch-  Tolerated well  Tender over pect major -  Can con with over head stretches for shoulder flexion and ABD against wall or in supine every day - so she do not compensate with deltoid and upper traps.  Info provided for massages she can get here for free at the cancer center to work on R upper quadrant and scar massage. Pt had appt but had to cancel it - next appt in not until middle May  Pain can fluctuate per pt from 7/10 and decrease with pain meds to 1/10  Pt was fitted with chest piece about 3 wks ago her lymphedema pump per Rep. Will decrease tightness and fullness in R upper quadrant. To use am and pm for 45 min to hour. Pt to cont with working out at [employer] 2 x wk - and wear her compression sleeve on R UE - and if needed knee high compression for leg swelling. Did discuss with [place] from CARE program - that pt need to work on [employer] -and strengthening of upper back  If not better in 2 wks - will see if can get her in forPT evaluation over at the hospital rehab - next to the CARE program - to focus on pect stretch, soft tissue and strengthening individually  Discuss last time with pt about energy conservation - pt compare her activity level to pre Cancer - pt to try and do other days if not CARE program - to do 22 min of activity or exercisese the other days - keep log  She likes to go to run errands - but cannot do what she done prior to CA - pt to schedule shorter trips and increase gradually over the weeks -and do something she enjoys every day - remind pt it is also okay to take short nap in afternoon  Visit Diagnosis: Chest wall pain  Stiffness of right shoulder, not elsewhere classified    Problem List Patient Active Problem List   Diagnosis Date Noted  . Ductal carcinoma in situ (DCIS) of left breast 03/04/2020  . Bilateral breast cancer ([place]) 10/09/2019  . Genetic testing 08/27/2019  . Family history of breast cancer   . Family history of ovarian cancer   . Family history of colon cancer   . Family history of leukemia   . Goals of care, counseling/discussion 08/07/2019  . Breast cancer ([place]) 07/30/2019  . GERD (gastroesophageal reflux disease) 06/30/2019  . Abdominal discomfort 05/15/2019  . Advance care planning 07/14/2018  . Health care maintenance 07/14/2018  . Depression, recurrent ([place]) 07/14/2018  . Constipation 07/14/2018  . Altered taste 07/14/2018  . Fibrocystic breast changes of both breasts 01/24/2018  . Vasomotor symptoms due to menopause 09/05/2017  . Herpes simplex vulvovaginitis 09/05/2017  . Skin nodule 05/25/2017  . Encounter for screening examination for infectious disease 03/18/2017  . Lipoma 03/18/2017  . Vaginitis 03/18/2017  . Radicular pain in left arm 07/27/2015  . Neuralgia and neuritis, unspecified 07/27/2015  . Fatigue 03/05/2015  . B12 deficiency 03/05/2015  . Skin rash 05/01/2014  . MVP (mitral valve prolapse) 04/30/2014  . Cyst of breast 04/05/2014  . Interstitial cystitis 03/31/2014  . Routine general medical examination at a health care facility 11/13/2011  . Anxiety and depression 10/06/2010  . BREAST CYSTS, BILATERAL 12/03/2008  . PALPITATIONS, CHRONIC 12/03/2008  . Vitamin D deficiency 09/10/2008  . HLD (hyperlipidemia) 09/10/2008  . IRRITABLE BOWEL SYNDROME 02/12/2008    Rosalyn Gess OTR/L,CLT 09/22/2020, 5:37 PM  [facility] [address] [place], [place], [place], [address] Phone: [phone]   Fax:  [phone]  Name: Debra Little MRN:  [id number] Date of Birth: [birth year]

## 2020-09-22 NOTE — Therapy (Signed)
[facility] [address], [address] [place], [place], [address] Phone: [phone]   Fax:  [phone]  Occupational Therapy Evaluation  Patient Details  Name: Debra Little MRN: [id number] Date of Birth: 07/23/1956 Referring Provider (OT): Dr Rao   Encounter Date: 09/22/2020   OT End of Session - 09/22/20 1722    Visit Number 0           Past Medical History:  Diagnosis Date  . Anemia    d/t chemo - last Hgb 10.8  . Anxiety    sees Dr. McKinney  . BRCA negative    Invitae panel neg except CHEK2 VUS  . Breast cancer ([facility]) 2020  . Cyst of breast    per Dr. Byrnett  . Dysrhythmia    Hx of palpitations  . Family history of breast cancer   . Family history of colon cancer   . Family history of leukemia   . Family history of ovarian cancer   . Fibromyalgia   . GERD (gastroesophageal reflux disease)   . Heart murmur   . Hemorrhoids   . Herpes, genital 04/2015   confirmed with HSV 2 IgG  . Hiatal hernia   . Hypercholesterolemia    Dr. Faith  . Hyperlipidemia   . IBS (irritable bowel syndrome)    constipation predominant  . IC (interstitial cystitis)    per [facility]  . Mild depression ([facility])   . MVP (mitral valve prolapse)    [facility] cards eval 2012  . Osteopenia    2010/2017, DEXA at [facility];spine and fem neck  . PONV (postoperative nausea and vomiting)   . Vitamin D deficiency    history of    Past Surgical History:  Procedure Laterality Date  . ABDOMINAL HYSTERECTOMY    . APPENDECTOMY    . BLADDER SURGERY     1980's  . BREAST EXCISIONAL BIOPSY Right    benign  . BREAST EXCISIONAL BIOPSY Bilateral    benign  . COLONOSCOPY  09/2014   Dr. Elliott  . COLONOSCOPY  2009   at [facility] 1 POLYP (BENIGN)  . excision of breast cysts     hx of multiple cyst aspirations  . EXPLORATORY LAPAROTOMY    . MASTECTOMY MODIFIED RADICAL Bilateral 10/09/2019   Procedure: RIGHT MASTECTOMY MODIFIED RADICAL AND  LEFT TOTAL MASTECTOMY;  Surgeon: Wakefield, Matthew, MD;  Location: [facility];  Service: General;  Laterality: Bilateral;  . PORTA CATH INSERTION N/A 08/06/2019   Procedure: PORTA CATH INSERTION;  Surgeon: Schnier, Gregory G, MD;  Location: [facility];  Service: Cardiovascular;  Laterality: N/A;  . PORTACATH PLACEMENT Left 08/05/2019   Procedure: INSERTION PORT-A-CATH, Attempted;  Surgeon: Pabon, Diego F, MD;  Location: [facility];  Service: General;  Laterality: Left;  . TUBAL LIGATION      There were no vitals filed for this visit.   Subjective Assessment - 09/22/20 1719    Subjective  I still have pain under my R arm, chest, over shoulder blade and upper traps - it can go up to 8/10 - and I had to cancel the massage therapist the other day - my next appt is not until middle Nov- I wish Dr Rao will do scan - I am just worried    Currently in Pain? Yes    Pain Score 8     Pain Location --   R axilla, scapula and chest   Pain Orientation Right    Pain   breast 03/04/2020  . Bilateral breast cancer ([facility]) 10/09/2019  . Genetic testing 08/27/2019  . Family history of breast cancer   . Family  history of ovarian cancer   . Family history of colon cancer   . Family history of leukemia   . Goals of care, counseling/discussion 08/07/2019  . Breast cancer ([facility]) 07/30/2019  . GERD (gastroesophageal reflux disease) 06/30/2019  . Abdominal discomfort 05/15/2019  . Advance care planning 07/14/2018  . Health care maintenance 07/14/2018  . Depression, recurrent ([facility]) 07/14/2018  . Constipation 07/14/2018  . Altered taste 07/14/2018  . Fibrocystic breast changes of both breasts 01/24/2018  . Vasomotor symptoms due to menopause 09/05/2017  . Herpes simplex vulvovaginitis 09/05/2017  . Skin nodule 05/25/2017  . Encounter for screening examination for infectious disease 03/18/2017  . Lipoma 03/18/2017  . Vaginitis 03/18/2017  . Radicular pain in left arm 07/27/2015  . Neuralgia and neuritis, unspecified 07/27/2015  . Fatigue 03/05/2015  . B12 deficiency 03/05/2015  . Skin rash 05/01/2014  . MVP (mitral valve prolapse) 04/30/2014  . Cyst of breast 04/05/2014  . Interstitial cystitis 03/31/2014  . Routine general medical examination at a health care facility 11/13/2011  . Anxiety and depression 10/06/2010  . BREAST CYSTS, BILATERAL 12/03/2008  . PALPITATIONS, CHRONIC 12/03/2008  . Vitamin D deficiency 09/10/2008  . HLD (hyperlipidemia) 09/10/2008  . IRRITABLE BOWEL SYNDROME 02/12/2008    ,  09/22/2020, 5:23 PM  [facility] [address], [address] [place], [place], [address] Phone: [phone]   Fax:  [phone]  Name: Debra Little MRN: [id number] Date of Birth: 07/19/1956   breast 03/04/2020  . Bilateral breast cancer ([place]) 10/09/2019  . Genetic testing 08/27/2019  . Family history of breast cancer   . Family  history of ovarian cancer   . Family history of colon cancer   . Family history of leukemia   . Goals of care, counseling/discussion 08/07/2019  . Breast cancer ([place]) 07/30/2019  . GERD (gastroesophageal reflux disease) 06/30/2019  . Abdominal discomfort 05/15/2019  . Advance care planning 07/14/2018  . Health care maintenance 07/14/2018  . Depression, recurrent ([place]) 07/14/2018  . Constipation 07/14/2018  . Altered taste 07/14/2018  . Fibrocystic breast changes of both breasts 01/24/2018  . Vasomotor symptoms due to menopause 09/05/2017  . Herpes simplex vulvovaginitis 09/05/2017  . Skin nodule 05/25/2017  . Encounter for screening examination for infectious disease 03/18/2017  . Lipoma 03/18/2017  . Vaginitis 03/18/2017  . Radicular pain in left arm 07/27/2015  . Neuralgia and neuritis, unspecified 07/27/2015  . Fatigue 03/05/2015  . B12 deficiency 03/05/2015  . Skin rash 05/01/2014  . MVP (mitral valve prolapse) 04/30/2014  . Cyst of breast 04/05/2014  . Interstitial cystitis 03/31/2014  . Routine general medical examination at a health care facility 11/13/2011  . Anxiety and depression 10/06/2010  . BREAST CYSTS, BILATERAL 12/03/2008  . PALPITATIONS, CHRONIC 12/03/2008  . Vitamin D deficiency 09/10/2008  . HLD (hyperlipidemia) 09/10/2008  . IRRITABLE BOWEL SYNDROME 02/12/2008    Rosalyn Gess 09/22/2020, 5:23 PM  [facility] [address] [place], [place], [place], [address] Phone: [phone]   Fax:  [phone]  Name: Debra Little MRN: [id number] Date of Birth: [birth year]

## 2020-09-23 ENCOUNTER — Encounter: Payer: Self-pay | Admitting: Oncology

## 2020-09-23 DIAGNOSIS — Z853 Personal history of malignant neoplasm of breast: Secondary | ICD-10-CM

## 2020-09-23 NOTE — Progress Notes (Signed)
Daily Session Note  Patient Details  Name: Debra Little MRN: [id number] Date of Birth: 01-Jan-1956 Referring Provider:    Encounter Date: 09/23/2020  Check In:      Social History   Tobacco Use  Smoking Status Never Smoker  Smokeless Tobacco Never Used    Goals Met:  Proper associated with RPD/PD & O2 Sat Independence with exercise equipment Exercise tolerated well Strength training completed today  Goals Unmet:  Not Applicable  Comments: Pt able to follow exercise prescription today without complaint.  Will continue to monitor for progression.    Dr. Emily Filbert is Medical Director for [facility].

## 2020-09-24 ENCOUNTER — Telehealth: Payer: Self-pay | Admitting: *Deleted

## 2020-09-24 ENCOUNTER — Inpatient Hospital Stay (HOSPITAL_BASED_OUTPATIENT_CLINIC_OR_DEPARTMENT_OTHER): Payer: PPO | Admitting: Nurse Practitioner

## 2020-09-24 ENCOUNTER — Other Ambulatory Visit: Payer: Self-pay

## 2020-09-24 VITALS — BP 132/57 | HR 88 | Temp 96.8°F | Resp 16

## 2020-09-24 DIAGNOSIS — R222 Localized swelling, mass and lump, trunk: Secondary | ICD-10-CM | POA: Diagnosis not present

## 2020-09-24 DIAGNOSIS — N6315 Unspecified lump in the right breast, overlapping quadrants: Secondary | ICD-10-CM

## 2020-09-24 DIAGNOSIS — R2231 Localized swelling, mass and lump, right upper limb: Secondary | ICD-10-CM | POA: Diagnosis not present

## 2020-09-24 NOTE — Telephone Encounter (Signed)
I can see her in [facility]. Thanks!

## 2020-09-24 NOTE — Telephone Encounter (Signed)
Call placed to patient to offer a [facility] visit. Pt has chosen to come in and be evaluated at 1:30 pm today.

## 2020-09-24 NOTE — Telephone Encounter (Signed)
Can you please look into this?

## 2020-09-24 NOTE — Progress Notes (Signed)
In [facility] clinic for chest wall pain that is central to lateral right chest goes under her arm and goes around to scapula area. Taking advil. Cut back on the tylenol. Takes oxycodone 1-2 x day. Gwenette Greet saw her earlier this week. She pressed real hard on axillary area and did not feel anything. Now she feels a knot and it is very tender. Pt admits to be nervous regarding her aggressive kind of cancer.

## 2020-09-24 NOTE — Telephone Encounter (Signed)
Patient called asking to be seen today stating that she has a lump under her arm on her breast cancer side. She also reports that the Prednisone given for pain is not helping with the pain. Please advise

## 2020-09-27 ENCOUNTER — Ambulatory Visit
Admission: RE | Admit: 2020-09-27 | Discharge: 2020-09-27 | Disposition: A | Discharge status: Home / Self Care | Payer: PPO | Source: Ambulatory Visit | Attending: Nurse Practitioner | Admitting: Nurse Practitioner

## 2020-09-27 ENCOUNTER — Other Ambulatory Visit: Payer: Self-pay

## 2020-09-27 ENCOUNTER — Other Ambulatory Visit: Payer: Self-pay | Admitting: Nurse Practitioner

## 2020-09-27 DIAGNOSIS — R2231 Localized swelling, mass and lump, right upper limb: Secondary | ICD-10-CM

## 2020-09-27 DIAGNOSIS — R928 Other abnormal and inconclusive findings on diagnostic imaging of breast: Secondary | ICD-10-CM

## 2020-09-27 DIAGNOSIS — N6489 Other specified disorders of breast: Secondary | ICD-10-CM | POA: Diagnosis not present

## 2020-09-28 ENCOUNTER — Telehealth: Payer: Self-pay

## 2020-09-28 NOTE — Telephone Encounter (Signed)
Pt calling for pap results.  [phone]  Adv pt pap wasn't done - not time for it; a pap will be done at her next appt.  Pt wanted to let [facility] know she found a mass under her right arm; has had an u/s done and is waiting to have a bx done.

## 2020-09-28 NOTE — Telephone Encounter (Signed)
Thx. U/s results reviewed.

## 2020-09-28 NOTE — Progress Notes (Signed)
Symptom Management [place]  [phone] [phone]  Patient Care Team: Tonia Ghent, MD as PCP - General (Family Medicine) Bary Castilla, Forest Gleason, MD (General Surgery) Modesto Charon, MD (Family Medicine) Nori Riis, PA-C as Physician Assistant (Urology) Teodoro Spray, MD as Consulting Physician (Cardiology) Sindy Guadeloupe, MD as Consulting Physician (Hematology and Oncology) Noreene Filbert, MD as Referring Physician (Radiation Oncology)   Name of the patient: Debra Little  [id number]  [birth year]   Date of visit: 09/28/20  Diagnosis-stage IIIc triple negative breast cancer  Chief complaint/ Reason for visit-new breast mass  Heme/Onc history:  Oncology History  Breast cancer ([place])  07/30/2019 Initial Diagnosis   Breast cancer ([place])   08/06/2019 Cancer Staging   Staging form: Breast, AJCC 8th Edition - Clinical stage from 08/06/2019: Stage IIIB (cT2, cN1, cM0, G3, ER-, PR-, HER2-) - Signed by Sindy Guadeloupe, MD on 08/07/2019   08/11/2019 -  Chemotherapy   The patient had dexamethasone (DECADRON) 4 MG tablet, 1 of 1 cycle, Start date: 08/07/2019, End date: 04/20/2020 DOXOrubicin (ADRIAMYCIN) chemo injection 106 mg, 60 mg/m2 = 106 mg, Intravenous,  Once, 4 of 4 cycles Administration: 106 mg (08/11/2019), 106 mg (09/01/2019), 100 mg (09/22/2019), 100 mg (12/02/2019) palonosetron (ALOXI) injection 0.25 mg, 0.25 mg, Intravenous,  Once, 7 of 7 cycles Administration: 0.25 mg (08/11/2019), 0.25 mg (12/16/2019), 0.25 mg (01/06/2020), 0.25 mg (09/01/2019), 0.25 mg (12/23/2019), 0.25 mg (12/30/2019), 0.25 mg (09/22/2019), 0.25 mg (12/02/2019), 0.25 mg (01/20/2020), 0.25 mg (01/27/2020) pegfilgrastim-cbqv (UDENYCA) injection 6 mg, 6 mg, Subcutaneous, Once, 4 of 4 cycles Administration: 6 mg (08/12/2019), 6 mg (09/02/2019), 6 mg (09/23/2019), 6 mg (12/03/2019) cyclophosphamide (CYTOXAN) 1,060 mg in sodium chloride 0.9 % 250 mL chemo infusion, 600  mg/m2 = 1,060 mg, Intravenous,  Once, 4 of 4 cycles Administration: 1,000 mg (09/01/2019), 1,000 mg (09/22/2019), 1,000 mg (12/02/2019) PACLitaxel (TAXOL) 138 mg in sodium chloride 0.9 % 250 mL chemo infusion (</= $RemoveBefor'80mg'TDgCYYYqtwmV$ /m2), 80 mg/m2 = 138 mg, Intravenous,  Once, 4 of 4 cycles Dose modification: 65 mg/m2 (original dose 80 mg/m2, Cycle 6, Reason: Provider Judgment) Administration: 138 mg (12/16/2019), 138 mg (12/23/2019), 114 mg (01/13/2020), 138 mg (12/30/2019), 114 mg (02/03/2020), 114 mg (01/20/2020), 114 mg (01/27/2020), 114 mg (02/10/2020), 114 mg (02/17/2020), 114 mg (02/24/2020), 114 mg (03/02/2020) pembrolizumab (KEYTRUDA) 200 mg in sodium chloride 0.9 % 50 mL chemo infusion, 200 mg (100 % of original dose 200 mg), Intravenous, Once, 3 of 3 cycles Dose modification: 200 mg (original dose 200 mg, Cycle 1, Reason: Provider Judgment) Administration: 200 mg (08/11/2019), 200 mg (09/01/2019), 200 mg (09/22/2019) fosaprepitant (EMEND) 150 mg, dexamethasone (DECADRON) 12 mg in sodium chloride 0.9 % 145 mL IVPB, , Intravenous,  Once, 4 of 4 cycles Administration:  (08/11/2019),  (09/01/2019),  (09/22/2019),  (12/02/2019)  for chemotherapy treatment.    08/26/2019 Genetic Testing   Negative genetic testing. VUS in CHEK2 called c.556A>C identified on the [facility] Common Hereditary Cancers Panel. The Common Hereditary Cancers Panel offered by [facility] includes sequencing and/or deletion duplication testing of the following 48 genes: APC, ATM, AXIN2, BARD1, BMPR1A, BRCA1, BRCA2, BRIP1, CDH1, CDKN2A (p14ARF), CDKN2A (p16INK4a), CKD4, CHEK2, CTNNA1, DICER1, EPCAM (Deletion/duplication testing only), GREM1 (promoter region deletion/duplication testing only), KIT, MEN1, MLH1, MSH2, MSH3, MSH6, MUTYH, NBN, NF1, NHTL1, PALB2, PDGFRA, PMS2, POLD1, POLE, PTEN, RAD50, RAD51C, RAD51D, RNF43, SDHB, SDHC, SDHD, SMAD4, SMARCA4. STK11, TP53, TSC1, TSC2, and VHL.  The following genes were evaluated for sequence changes only: SDHA and  HOXB13 c.251G>A  variant only. The report date is 08/26/2019.    10/21/2019 Cancer Staging   Staging form: Breast, AJCC 8th Edition - Pathologic stage from 10/21/2019: Stage IIIC (pT2, pN2a, cM0, G3, ER-, PR-, HER2-) - Signed by Sindy Guadeloupe, MD on 10/27/2019     Interval history-patient presents to symptom management clinic with history of stage IIIb triple negative invasive mammary carcinoma of the right breast status post 3 cycles of neoadjuvant AC Keytruda chemotherapy followed by bilateral mastectomy and targeted node dissection due to no significant response to neoadjuvant chemotherapy, followed by adjuvant dd ACT and radiation to chest wall.  She went on to have bilateral mastectomy and node dissection which showed stage IIIc disease.  She has had complaints of right chest wall pain and has been seen for lymphedema postmastectomy.  Has tried opioids, physical therapy, Tylenol, Motrin, gabapentin without significant relief.  Today, she says that 2 days ago she noticed a palpable mass in right axilla adjacent to mastectomy scar.  Tender to touch.  Feels that mass is bigger today than yesterday. Says she has been told she has a high risk of recurrent disease and she is very concerned that this mass will be cancer.  She denies skin changes or erythema.  ECOG FS:1 - Symptomatic but completely ambulatory  Review of systems- Review of Systems  Constitutional: Negative for chills, fever, malaise/fatigue and weight loss.  HENT: Negative for hearing loss, nosebleeds, sore throat and tinnitus.   Eyes: Negative for blurred vision and double vision.  Respiratory: Negative for cough, hemoptysis, shortness of breath and wheezing.   Cardiovascular: Negative for chest pain, palpitations and leg swelling.  Gastrointestinal: Negative for abdominal pain, blood in stool, constipation, diarrhea, melena, nausea and vomiting.  Genitourinary: Negative for dysuria and urgency.  Musculoskeletal: Negative for back pain, falls, joint  pain and myalgias.  Skin: Negative for itching and rash.  Neurological: Negative for dizziness, tingling, sensory change, loss of consciousness, weakness and headaches.  Endo/Heme/Allergies: Negative for environmental allergies. Does not bruise/bleed easily.  Psychiatric/Behavioral: Negative for depression. The patient is nervous/anxious. The patient does not have insomnia.      Current treatment-surveillance  Allergies  Allergen Reactions  . Amitiza [Lubiprostone] Other (See Comments)    Overly effective at higher dose  . Bentyl [Dicyclomine Hcl]     Lack of effect  . Cortisone Other (See Comments)    flush  . Mobic [Meloxicam]     Palpitations.  But can tolerate aleve  . Nitrofurantoin Monohyd Macro   . Sulfonamide Derivatives Nausea Only  . Zoloft [Sertraline Hcl] Diarrhea and Nausea Only  . Buspar [Buspirone] Palpitations  . Celecoxib Rash    REACTION: unspecified  . Doxycycline Palpitations    chest pain  . Penicillins Rash   Immunization History  Administered Date(s) Administered  . Influenza Whole 09/08/2010  . Influenza, Seasonal, Injecte, Preservative Fre 09/08/2014  . Influenza,inj,Quad PF,6+ Mos 09/23/2013, 08/15/2018, 08/02/2019  . Influenza-Unspecified 09/11/2015, 08/14/2020, 08/18/2020    Past Medical History:  Diagnosis Date  . Anemia    d/t chemo - last Hgb 10.8  . Anxiety    sees Dr. [name]  . BRCA negative    [facility] panel neg except CHEK2 VUS  . Breast cancer ([place]) 2020  . Cyst of breast    per [name]  . Dysrhythmia    Hx of palpitations  . Family history of breast cancer   . Family history of colon cancer   . Family history of  leukemia   . Family history of ovarian cancer   . Fibromyalgia   . GERD (gastroesophageal reflux disease)   . Heart murmur   . Hemorrhoids   . Herpes, genital 04/2015   confirmed with HSV 2 IgG  . Hiatal hernia   . Hypercholesterolemia    Dr. Rushie Goltz  . Hyperlipidemia   . IBS (irritable bowel syndrome)     constipation predominant  . IC (interstitial cystitis)    per [facility]  . Mild depression ([facility])   . MVP (mitral valve prolapse)    [facility] cards eval 04-11-2011  . Osteopenia    2010/2017, DEXA at [facility];spine and fem neck  . PONV (postoperative nausea and vomiting)   . Vitamin D deficiency    history of    Past Surgical History:  Procedure Laterality Date  . ABDOMINAL HYSTERECTOMY    . APPENDECTOMY    . BLADDER SURGERY     1980's  . BREAST EXCISIONAL BIOPSY Right    benign  . BREAST EXCISIONAL BIOPSY Bilateral    benign  . COLONOSCOPY  09/2014   Dr. Mechele Collin  . COLONOSCOPY  04/10/2008   at [facility] 1 POLYP (BENIGN)  . excision of breast cysts     hx of multiple cyst aspirations  . EXPLORATORY LAPAROTOMY    . MASTECTOMY MODIFIED RADICAL Bilateral 10/09/2019   Procedure: RIGHT MASTECTOMY MODIFIED RADICAL AND LEFT TOTAL MASTECTOMY;  Surgeon: Emelia Loron, MD;  Location: [facility] OR;  Service: General;  Laterality: Bilateral;  . PORTA CATH INSERTION N/A 08/06/2019   Procedure: PORTA CATH INSERTION;  Surgeon: Renford Dills, MD;  Location: [facility] INVASIVE CV LAB;  Service: Cardiovascular;  Laterality: N/A;  . PORTACATH PLACEMENT Left 08/05/2019   Procedure: INSERTION PORT-A-CATH, Attempted;  Surgeon: Leafy Ro, MD;  Location: [facility] ORS;  Service: General;  Laterality: Left;  . TUBAL LIGATION      Social History   Socioeconomic History  . Marital status: Divorced    Spouse name: Not on file  . Number of children: 1  . Years of education: Not on file  . Highest education level: Not on file  Occupational History  . Occupation: [employer]  Tobacco Use  . Smoking status: Never Smoker  . Smokeless tobacco: Never Used  Vaping Use  . Vaping Use: Never used  Substance and Sexual Activity  . Alcohol use: No    Alcohol/week: 0.0 standard drinks  . Drug use: No  . Sexual activity: Yes    Birth control/protection: Surgical    Comment: Hysterectomy  Other Topics Concern  . Not on file    Social History Narrative   Married in 04/10/97, divorced as of 2017-04-10, 1 son from previous relationship   Brother with MVA at 109, in rest home for many years as of Apr 10, 2018- died of covid recently   Social Determinants of Corporate investment banker Strain: Low Risk   . Difficulty of Paying Living Expenses: Not hard at all  Food Insecurity: No Food Insecurity  . Worried About Programme researcher, broadcasting/film/video in the Last Year: Never true  . Ran Out of Food in the Last Year: Never true  Transportation Needs: No Transportation Needs  . Lack of Transportation (Medical): No  . Lack of Transportation (Non-Medical): No  Physical Activity: Inactive  . Days of Exercise per Week: 0 days  . Minutes of Exercise per Session: 0 min  Stress: No Stress Concern Present  . Feeling of Stress : Not at all  Social  Connections:   . Frequency of Communication with Friends and Family: Not on file  . Frequency of Social Gatherings with Friends and Family: Not on file  . Attends Religious Services: Not on file  . Active Member of Clubs or Organizations: Not on file  . Attends Archivist Meetings: Not on file  . Marital Status: Not on file  Intimate Partner Violence: Not At Risk  . Fear of Current or Ex-Partner: No  . Emotionally Abused: No  . Physically Abused: No  . Sexually Abused: No    Family History  Problem Relation Age of Onset  . Breast cancer Mother 69  . Hypertension Mother   . Colon cancer Father 79  . Hyperlipidemia Father   . Ovarian cancer Maternal Aunt 80  . Breast cancer Cousin   . Gallbladder disease Paternal Grandmother   . Liver cancer Cousin 70       Malignant  . Cancer Maternal Uncle        unsure type     Current Outpatient Medications:  .  acetaminophen (TYLENOL) 650 MG CR tablet, Take 650 mg by mouth every 8 (eight) hours as needed for pain., Disp: , Rfl:  .  Ascorbic Acid (VITAMIN C) 100 MG tablet, Take 100 mg by mouth daily., Disp: , Rfl:  .  atorvastatin (LIPITOR) 40 MG  tablet, Take 40 mg by mouth at bedtime. , Disp: , Rfl: 1 .  Calcium Carbonate-Vit D-Min (CALCIUM 1200 PO), Take 4 tablets by mouth daily., Disp: , Rfl:  .  cholecalciferol (VITAMIN D3) 25 MCG (1000 UNIT) tablet, Take 1 tablet (1,000 Units total) by mouth daily., Disp: , Rfl:  .  clonazePAM (KLONOPIN) 0.5 MG tablet, Take 0.5 mg by mouth 2 (two) times daily. , Disp: , Rfl:  .  Dexlansoprazole 30 MG capsule, Take 1 capsule (30 mg total) by mouth daily., Disp: 30 capsule, Rfl: 6 .  DULoxetine (CYMBALTA) 60 MG capsule, Take 60 mg by mouth daily. , Disp: , Rfl:  .  furosemide (LASIX) 20 MG tablet, Take 1 tablet (20 mg total) by mouth every other day., Disp: , Rfl:  .  lidocaine-prilocaine (EMLA) cream, APPLY TO AFFECTED AREAS ONCE AS DIRECTED, Disp: 30 g, Rfl: 3 .  loratadine (CLARITIN) 10 MG tablet, Take 1 tablet (10 mg total) by mouth daily., Disp: , Rfl:  .  ondansetron (ZOFRAN) 8 MG tablet, Take 1 tablet (8 mg total) by mouth 2 (two) times daily as needed. Start on the third day after chemotherapy., Disp: 30 tablet, Rfl: 1 .  oxyCODONE (OXY IR/ROXICODONE) 5 MG immediate release tablet, Take 1 tablet (5 mg total) by mouth every 6 (six) hours as needed for severe pain., Disp: 30 tablet, Rfl: 0 .  Plecanatide (TRULANCE) 3 MG TABS, Take 3 mg by mouth daily. , Disp: , Rfl:  .  predniSONE (DELTASONE) 10 MG tablet, Take 1 tablet (10 mg total) by mouth daily with breakfast., Disp: 14 tablet, Rfl: 0 .  sennosides-docusate sodium (SENOKOT-S) 8.6-50 MG tablet, Take 1-2 tablets by mouth at bedtime. , Disp: , Rfl:  .  valACYclovir (VALTREX) 500 MG tablet, Take 1 tablet (500 mg total) by mouth daily., Disp: 90 tablet, Rfl: 3 .  vitamin B-12 (CYANOCOBALAMIN) 500 MCG tablet, Take 500 mcg by mouth daily., Disp: , Rfl:  .  gabapentin (NEURONTIN) 100 MG capsule, Take 1-2 capsules (100-200 mg total) by mouth 3 (three) times daily as needed. (Patient not taking: Reported on 09/24/2020), Disp: 90 capsule, Rfl:  1 .   HYDROcodone-acetaminophen (NORCO/VICODIN) 5-325 MG tablet, Take 1 tablet by mouth every 6 (six) hours as needed for moderate pain. (Patient not taking: Reported on 09/24/2020), Disp: 15 tablet, Rfl: 0 .  sucralfate (CARAFATE) 1 g tablet, Take 1 g by mouth 3 (three) times daily.  (Patient not taking: Reported on 09/24/2020), Disp: , Rfl:   Physical exam:  Vitals:   09/24/20 1333  BP: (!) 132/57  Pulse: 88  Resp: 16  Temp: (!) 96.8 F (36 C)  SpO2: 99%   Physical Exam Constitutional:      General: She is not in acute distress. Chest:     Comments: Right chest wall-at margin of right axilla mastectomy scar, approximately 9:00 there is a 1.5 x 1 cm palpable nodularity; tender to palpation.  No overlying skin changes or erythema.  Firm.  No other palpable masses.  Postoperative changes.  Skin:    General: Skin is warm and dry.  Neurological:     Mental Status: She is alert and oriented to person, place, and time.     Motor: No weakness.  Psychiatric:        Attention and Perception: Attention normal.        Mood and Affect: Mood is anxious. Affect is tearful.        Speech: Speech normal.        Behavior: Behavior normal.        Thought Content: Thought content normal.        Cognition and Memory: Cognition normal.        Judgment: Judgment normal.     CMP Latest Ref Rng & Units 08/10/2020  Glucose 70 - 99 mg/dL 124(H)  BUN 8 - 23 mg/dL 14  Creatinine 0.44 - 1.00 mg/dL 0.72  Sodium 135 - 145 mmol/L 137  Potassium 3.5 - 5.1 mmol/L 3.8  Chloride 98 - 111 mmol/L 101  CO2 22 - 32 mmol/L 28  Calcium 8.9 - 10.3 mg/dL 8.7(L)  Total Protein 6.5 - 8.1 g/dL 7.1  Total Bilirubin 0.3 - 1.2 mg/dL 0.6  Alkaline Phos 38 - 126 U/L 66  AST 15 - 41 U/L 18  ALT 0 - 44 U/L 13   CBC Latest Ref Rng & Units 08/10/2020  WBC 4.0 - 10.5 K/uL 9.1  Hemoglobin 12.0 - 15.0 g/dL 11.6(L)  Hematocrit 36 - 46 % 34.8(L)  Platelets 150 - 400 K/uL 236    No images are attached to the encounter.  US  Breast Limited Uni Right Inc Axilla  Result Date: 09/27/2020 CLINICAL DATA:  64 year old female who had bilateral mastectomies for breast cancer in November of 2020. Patient complains of a palpable abnormality at the lateral margin of the mastectomy scar in the lower axillary region. EXAM: ULTRASOUND OF THE RIGHT BREAST COMPARISON:  Previous exam(s). FINDINGS: On physical exam, I palpate a discrete mass in the lower right axilla at the lateral margin of the mastectomy scar. Targeted ultrasound is performed, showing a vascular hypoechoic mass in the lower right axilla measuring 1.6 x 1.1 x 1.1 cm. No additional masses are seen in the axillary region. IMPRESSION: Suspicious mass in the lower right axilla at the margin of the mastectomy scar. RECOMMENDATION: Ultrasound-guided core biopsy of the right breast/axillary mass is recommended. I have discussed the findings and recommendations with the patient. If applicable, a reminder letter will be sent to the patient regarding the next appointment. BI-RADS CATEGORY  4: Suspicious. Electronically Signed   By: Georgiana Shore.D.  On: 09/27/2020 14:08    Assessment and plan- Patient is a 63 y.o. female diagnosed with stage IIIc triple negative breast cancer with metaplastic features who presents to symptom management clinic for palpable right breast lump.  On exam approximately 1.5 x 1 cm Mass 9 o'clock position adjacent to margin of right axilla mastectomy scar.  Patient is high risk for recurrent disease.  Dr. Janese Banks also independently assessed patient and agrees with recommendation to undergo ultrasound and possible biopsy.   Follow-up based on ultrasound findings.   Visit Diagnosis 1. Breast lump on right side at 9 o'clock position   2. Axillary lump, right     Patient expressed understanding and was in agreement with this plan. She also understands that She can call clinic at any time with any questions, concerns, or complaints.   Thank you for allowing me to  participate in the care of this very pleasant patient.   Beckey Rutter, DNP, AGNP-C [place] [facility] [place]

## 2020-09-29 ENCOUNTER — Telehealth: Payer: Self-pay | Admitting: *Deleted

## 2020-09-29 NOTE — Telephone Encounter (Addendum)
Patient called stating that she has not heard back from her [place] or appointment for a biopsy that was discussed and voiced "disappointing" that she has not heard from [place].  Study Result  Narrative & Impression  CLINICAL DATA:  64 year old female who had bilateral mastectomies for breast cancer in November of 2020. Patient complains of a palpable abnormality at the lateral margin of the mastectomy scar in the lower axillary region.  EXAM: ULTRASOUND OF THE RIGHT BREAST  COMPARISON:  Previous exam(s).  FINDINGS: On physical exam, I palpate a discrete mass in the lower right axilla at the lateral margin of the mastectomy scar.  Targeted ultrasound is performed, showing a vascular hypoechoic mass in the lower right axilla measuring 1.6 x 1.1 x 1.1 cm. No additional masses are seen in the axillary region.  IMPRESSION: Suspicious mass in the lower right axilla at the margin of the mastectomy scar.  RECOMMENDATION: Ultrasound-guided core biopsy of the right breast/axillary mass is recommended.  I have discussed the findings and recommendations with the patient. If applicable, a reminder letter will be sent to the patient regarding the next appointment.  BI-RADS CATEGORY  4: Suspicious.   Electronically Signed   By: Lillia Mountain M.D.   On: 09/27/2020 14:08

## 2020-09-29 NOTE — Telephone Encounter (Signed)
I'll call patient and discuss with her. Thanks for assisting.

## 2020-09-29 NOTE — Telephone Encounter (Signed)
I received confirmation yesterday from [place] [facility] [place] that she had the orders.  She will call to schedule.

## 2020-09-30 ENCOUNTER — Ambulatory Visit
Admission: RE | Admit: 2020-09-30 | Discharge: 2020-09-30 | Disposition: A | Discharge status: Home / Self Care | Payer: PPO | Source: Ambulatory Visit | Attending: Nurse Practitioner | Admitting: Nurse Practitioner

## 2020-09-30 ENCOUNTER — Other Ambulatory Visit: Payer: Self-pay

## 2020-09-30 ENCOUNTER — Encounter: Payer: PPO | Attending: Oncology

## 2020-09-30 DIAGNOSIS — R928 Other abnormal and inconclusive findings on diagnostic imaging of breast: Secondary | ICD-10-CM | POA: Diagnosis not present

## 2020-09-30 DIAGNOSIS — R2231 Localized swelling, mass and lump, right upper limb: Secondary | ICD-10-CM | POA: Diagnosis not present

## 2020-09-30 DIAGNOSIS — Z853 Personal history of malignant neoplasm of breast: Secondary | ICD-10-CM | POA: Insufficient documentation

## 2020-09-30 DIAGNOSIS — C50919 Malignant neoplasm of unspecified site of unspecified female breast: Secondary | ICD-10-CM | POA: Diagnosis not present

## 2020-09-30 DIAGNOSIS — Z7689 Persons encountering health services in other specified circumstances: Secondary | ICD-10-CM | POA: Diagnosis not present

## 2020-09-30 DIAGNOSIS — C50511 Malignant neoplasm of lower-outer quadrant of right female breast: Secondary | ICD-10-CM | POA: Diagnosis not present

## 2020-09-30 DIAGNOSIS — N6310 Unspecified lump in the right breast, unspecified quadrant: Secondary | ICD-10-CM | POA: Diagnosis not present

## 2020-09-30 DIAGNOSIS — C50911 Malignant neoplasm of unspecified site of right female breast: Secondary | ICD-10-CM | POA: Diagnosis not present

## 2020-09-30 NOTE — Telephone Encounter (Signed)
Spoke to [facility] and she called her yest. And today after her biopsy

## 2020-10-01 ENCOUNTER — Telehealth: Payer: Self-pay | Admitting: Nurse Practitioner

## 2020-10-01 ENCOUNTER — Other Ambulatory Visit: Payer: Self-pay | Admitting: Nurse Practitioner

## 2020-10-01 DIAGNOSIS — C50911 Malignant neoplasm of unspecified site of right female breast: Secondary | ICD-10-CM

## 2020-10-01 NOTE — Telephone Encounter (Signed)
Spoke to patient regarding biopsy results. Pathology consistent with recurrent breast cancer. PET scan ordered and patient will follow up with Dr. Janese Banks for results. [name] updated and he will see patient following pet as well for discussion of excision.

## 2020-10-05 ENCOUNTER — Telehealth: Payer: Self-pay

## 2020-10-05 DIAGNOSIS — C50911 Malignant neoplasm of unspecified site of right female breast: Secondary | ICD-10-CM | POA: Diagnosis not present

## 2020-10-05 DIAGNOSIS — C7989 Secondary malignant neoplasm of other specified sites: Secondary | ICD-10-CM | POA: Diagnosis not present

## 2020-10-05 NOTE — Telephone Encounter (Signed)
Spoke with patient, wants to continue with CARE program but does not want to re-start until she gets a solidified treatment plan from providers. Will note any restrictions if needed stated from providers as well. Patient agreed and will keep [place] updated.

## 2020-10-06 ENCOUNTER — Other Ambulatory Visit: Payer: Self-pay | Admitting: *Deleted

## 2020-10-06 ENCOUNTER — Other Ambulatory Visit: Payer: Self-pay | Admitting: Nurse Practitioner

## 2020-10-06 ENCOUNTER — Telehealth: Payer: Self-pay | Admitting: *Deleted

## 2020-10-06 MED ORDER — TRAMADOL HCL 50 MG PO TABS
50.0000 mg | ORAL_TABLET | Freq: Four times a day (QID) | ORAL | 0 refills | Status: DC | PRN
Start: 2020-10-06 — End: 2020-10-08

## 2020-10-06 NOTE — Telephone Encounter (Signed)
Patient called reporting that she is having problems with pain and feeling fevered. This is worse at night when she has chills, body aches and then eventually breaks out in a sweat. She states that before the chills and sweating she was not fevered, but then felt very hot in middle of night, but did not check her temp. She is asking what needs to be done. Please advise

## 2020-10-06 NOTE — Telephone Encounter (Signed)
PET scan scheduled for tomorrow and she will see Dr. Janese Banks on 11/12. She's had some intolerance to hydrocodone and oxycodone. We'll do a trial of tramadol and she'll call me tomorrow to assess tolerance.

## 2020-10-06 NOTE — Telephone Encounter (Signed)
Spoke to pt and she thought she had fever last night before bed and did not. She was achy, sweating and could not sleep, took expired hydrocodone and it did not help and then she took advil mixed with tylenol and able to get sleep in recliner. She still hurts and the oxycodone makes her sick and have a HA so she does not want to take that. We offered a video visit with symptom management. She just wanted lauren to give her pain med to help with pain. She also hurts at bx site and where cancer is in the breast.  Lauren sent in tramadol and pt will try it with tylenol each dose and the pt to call back tomorrow and see if med worked or not. Pt agreeable to this plan

## 2020-10-07 ENCOUNTER — Other Ambulatory Visit: Payer: Self-pay

## 2020-10-07 ENCOUNTER — Ambulatory Visit
Admission: RE | Admit: 2020-10-07 | Discharge: 2020-10-07 | Disposition: A | Discharge status: Home / Self Care | Payer: PPO | Source: Ambulatory Visit | Attending: Nurse Practitioner | Admitting: Nurse Practitioner

## 2020-10-07 DIAGNOSIS — K769 Liver disease, unspecified: Secondary | ICD-10-CM | POA: Insufficient documentation

## 2020-10-07 DIAGNOSIS — R220 Localized swelling, mass and lump, head: Secondary | ICD-10-CM | POA: Diagnosis not present

## 2020-10-07 DIAGNOSIS — K7689 Other specified diseases of liver: Secondary | ICD-10-CM | POA: Diagnosis not present

## 2020-10-07 DIAGNOSIS — C761 Malignant neoplasm of thorax: Secondary | ICD-10-CM | POA: Diagnosis not present

## 2020-10-07 DIAGNOSIS — C50911 Malignant neoplasm of unspecified site of right female breast: Secondary | ICD-10-CM

## 2020-10-07 LAB — GLUCOSE, CAPILLARY: Glucose-Capillary: 81 mg/dL (ref 70–99)

## 2020-10-07 MED ORDER — FLUDEOXYGLUCOSE F - 18 (FDG) INJECTION
8.7900 | Freq: Once | INTRAVENOUS | Status: AC | PRN
  Administered 2020-10-07: 8.79 via INTRAVENOUS

## 2020-10-08 ENCOUNTER — Inpatient Hospital Stay: Payer: PPO | Attending: Oncology | Admitting: Oncology

## 2020-10-08 ENCOUNTER — Encounter: Payer: Self-pay | Admitting: Oncology

## 2020-10-08 ENCOUNTER — Other Ambulatory Visit: Payer: Self-pay | Admitting: *Deleted

## 2020-10-08 VITALS — BP 120/61 | HR 87 | Temp 97.6°F | Resp 16 | Wt 160.6 lb

## 2020-10-08 DIAGNOSIS — Z79899 Other long term (current) drug therapy: Secondary | ICD-10-CM | POA: Insufficient documentation

## 2020-10-08 DIAGNOSIS — G893 Neoplasm related pain (acute) (chronic): Secondary | ICD-10-CM | POA: Diagnosis not present

## 2020-10-08 DIAGNOSIS — Z5111 Encounter for antineoplastic chemotherapy: Secondary | ICD-10-CM | POA: Diagnosis not present

## 2020-10-08 DIAGNOSIS — Z7189 Other specified counseling: Secondary | ICD-10-CM | POA: Diagnosis not present

## 2020-10-08 DIAGNOSIS — F419 Anxiety disorder, unspecified: Secondary | ICD-10-CM | POA: Diagnosis not present

## 2020-10-08 DIAGNOSIS — R5383 Other fatigue: Secondary | ICD-10-CM | POA: Diagnosis not present

## 2020-10-08 DIAGNOSIS — E785 Hyperlipidemia, unspecified: Secondary | ICD-10-CM | POA: Insufficient documentation

## 2020-10-08 DIAGNOSIS — Z9013 Acquired absence of bilateral breasts and nipples: Secondary | ICD-10-CM | POA: Insufficient documentation

## 2020-10-08 DIAGNOSIS — D649 Anemia, unspecified: Secondary | ICD-10-CM | POA: Diagnosis not present

## 2020-10-08 DIAGNOSIS — C787 Secondary malignant neoplasm of liver and intrahepatic bile duct: Secondary | ICD-10-CM | POA: Diagnosis not present

## 2020-10-08 DIAGNOSIS — Z803 Family history of malignant neoplasm of breast: Secondary | ICD-10-CM | POA: Diagnosis not present

## 2020-10-08 DIAGNOSIS — C50511 Malignant neoplasm of lower-outer quadrant of right female breast: Secondary | ICD-10-CM | POA: Diagnosis not present

## 2020-10-08 DIAGNOSIS — Z5189 Encounter for other specified aftercare: Secondary | ICD-10-CM | POA: Insufficient documentation

## 2020-10-08 DIAGNOSIS — Z7952 Long term (current) use of systemic steroids: Secondary | ICD-10-CM | POA: Diagnosis not present

## 2020-10-08 DIAGNOSIS — C50911 Malignant neoplasm of unspecified site of right female breast: Secondary | ICD-10-CM

## 2020-10-08 DIAGNOSIS — R5381 Other malaise: Secondary | ICD-10-CM | POA: Diagnosis not present

## 2020-10-08 DIAGNOSIS — K219 Gastro-esophageal reflux disease without esophagitis: Secondary | ICD-10-CM | POA: Diagnosis not present

## 2020-10-08 DIAGNOSIS — C773 Secondary and unspecified malignant neoplasm of axilla and upper limb lymph nodes: Secondary | ICD-10-CM | POA: Diagnosis not present

## 2020-10-08 DIAGNOSIS — Z171 Estrogen receptor negative status [ER-]: Secondary | ICD-10-CM | POA: Diagnosis not present

## 2020-10-08 MED ORDER — HYDROMORPHONE HCL 1 MG/ML PO LIQD: 0.5000 mg | ORAL | 0 refills | Status: AC | PRN

## 2020-10-08 MED ORDER — LIDOCAINE-PRILOCAINE 2.5-2.5 % EX CREA: 1.0000 "application " | TOPICAL_CREAM | CUTANEOUS | 0 refills | Status: DC | PRN

## 2020-10-08 MED ORDER — TRULANCE 3 MG PO TABS: 3.0000 mg | ORAL_TABLET | Freq: Every day | ORAL | 0 refills | Status: DC

## 2020-10-08 NOTE — Progress Notes (Signed)
DISCONTINUE ON PATHWAY REGIMEN - Breast     A cycle is every 21 days:     Doxorubicin      Cyclophosphamide      Paclitaxel      Carboplatin   **Always confirm dose/schedule in your pharmacy ordering system**  REASON: Disease Progression PRIOR TREATMENT: [id number]: AC [Doxorubicin + Cyclophosphamide q21 Days x 4 Cycles], Followed by Paclitaxel 80 mg/m2 Weekly + Carboplatin AUC=6 q21 Days x 12 Weeks TREATMENT RESPONSE: Progressive Disease (PD)  START OFF PATHWAY REGIMEN - Other   [id number]:Gemcitabine + Carboplatin (1,000/2) q21 Days:   A cycle is every 21 days:     Gemcitabine      Carboplatin   **Always confirm dose/schedule in your pharmacy ordering system**  Patient Characteristics: Intent of Therapy: Non-Curative / Palliative Intent, Discussed with Patient

## 2020-10-11 DIAGNOSIS — F410 Panic disorder [episodic paroxysmal anxiety] without agoraphobia: Secondary | ICD-10-CM | POA: Diagnosis not present

## 2020-10-11 DIAGNOSIS — F411 Generalized anxiety disorder: Secondary | ICD-10-CM | POA: Diagnosis not present

## 2020-10-11 DIAGNOSIS — F330 Major depressive disorder, recurrent, mild: Secondary | ICD-10-CM | POA: Diagnosis not present

## 2020-10-12 ENCOUNTER — Inpatient Hospital Stay: Payer: PPO

## 2020-10-12 ENCOUNTER — Encounter: Payer: Self-pay | Admitting: Oncology

## 2020-10-12 ENCOUNTER — Telehealth: Payer: Self-pay

## 2020-10-12 ENCOUNTER — Inpatient Hospital Stay (HOSPITAL_BASED_OUTPATIENT_CLINIC_OR_DEPARTMENT_OTHER): Payer: PPO | Admitting: Oncology

## 2020-10-12 VITALS — BP 120/65 | HR 84 | Temp 97.9°F | Resp 16 | Wt 160.3 lb

## 2020-10-12 DIAGNOSIS — Z171 Estrogen receptor negative status [ER-]: Secondary | ICD-10-CM

## 2020-10-12 DIAGNOSIS — C50511 Malignant neoplasm of lower-outer quadrant of right female breast: Secondary | ICD-10-CM | POA: Diagnosis not present

## 2020-10-12 DIAGNOSIS — Z5111 Encounter for antineoplastic chemotherapy: Secondary | ICD-10-CM | POA: Diagnosis not present

## 2020-10-12 DIAGNOSIS — C50911 Malignant neoplasm of unspecified site of right female breast: Secondary | ICD-10-CM

## 2020-10-12 DIAGNOSIS — G893 Neoplasm related pain (acute) (chronic): Secondary | ICD-10-CM | POA: Diagnosis not present

## 2020-10-12 LAB — COMPREHENSIVE METABOLIC PANEL
ALT: 12 U/L (ref 0–44)
AST: 16 U/L (ref 15–41)
Albumin: 3.1 g/dL — ABNORMAL LOW (ref 3.5–5.0)
Alkaline Phosphatase: 70 U/L (ref 38–126)
Anion gap: 9 (ref 5–15)
BUN: 13 mg/dL (ref 8–23)
CO2: 28 mmol/L (ref 22–32)
Calcium: 8.9 mg/dL (ref 8.9–10.3)
Chloride: 99 mmol/L (ref 98–111)
Creatinine, Ser: 0.84 mg/dL (ref 0.44–1.00)
GFR, Estimated: >60 mL/min (ref >60)
Glucose, Bld: 107 mg/dL — ABNORMAL HIGH (ref 70–99)
Potassium: 3.7 mmol/L (ref 3.5–5.1)
Sodium: 136 mmol/L (ref 135–145)
Total Bilirubin: 0.5 mg/dL (ref 0.3–1.2)
Total Protein: 7.1 g/dL (ref 6.5–8.1)

## 2020-10-12 LAB — CBC WITH DIFFERENTIAL/PLATELET
Abs Immature Granulocytes: 0.06 10*3/uL (ref 0.00–0.07)
Basophils Absolute: 0 10*3/uL (ref 0.0–0.1)
Basophils Relative: 0 %
Eosinophils Absolute: 0.4 10*3/uL (ref 0.0–0.5)
Eosinophils Relative: 3 %
HCT: 32.8 % — ABNORMAL LOW (ref 36.0–46.0)
Hemoglobin: 10.9 g/dL — ABNORMAL LOW (ref 12.0–15.0)
Immature Granulocytes: 1 %
Lymphocytes Relative: 9 %
Lymphs Abs: 1.1 10*3/uL (ref 0.7–4.0)
MCH: 28.2 pg (ref 26.0–34.0)
MCHC: 33.2 g/dL (ref 30.0–36.0)
MCV: 84.8 fL (ref 80.0–100.0)
Monocytes Absolute: 1.4 10*3/uL — ABNORMAL HIGH (ref 0.1–1.0)
Monocytes Relative: 11 %
Neutro Abs: 9.8 10*3/uL — ABNORMAL HIGH (ref 1.7–7.7)
Neutrophils Relative %: 76 %
Platelets: 319 10*3/uL (ref 150–400)
RBC: 3.87 MIL/uL (ref 3.87–5.11)
RDW: 12.2 % (ref 11.5–15.5)
WBC: 12.8 10*3/uL — ABNORMAL HIGH (ref 4.0–10.5)
nRBC: 0 % (ref 0.0–0.2)

## 2020-10-12 MED ORDER — LIDOCAINE-PRILOCAINE 2.5-2.5 % EX CREA: TOPICAL_CREAM | CUTANEOUS | 3 refills | Status: DC

## 2020-10-12 MED ORDER — ONDANSETRON HCL 8 MG PO TABS: 8.0000 mg | ORAL_TABLET | Freq: Two times a day (BID) | ORAL | 1 refills | Status: DC | PRN

## 2020-10-12 MED ORDER — HEPARIN SOD (PORK) LOCK FLUSH 100 UNIT/ML IV SOLN
500.0000 [IU] | Freq: Once | INTRAVENOUS | Status: AC
  Administered 2020-10-12: 500 [IU] via INTRAVENOUS
  Filled 2020-10-12: qty 5

## 2020-10-12 MED ORDER — PROCHLORPERAZINE MALEATE 10 MG PO TABS: 10.0000 mg | ORAL_TABLET | Freq: Four times a day (QID) | ORAL | 1 refills | Status: DC | PRN

## 2020-10-12 MED ORDER — SODIUM CHLORIDE 0.9 % IV SOLN
Freq: Once | INTRAVENOUS | Status: AC
  Filled 2020-10-12: qty 250

## 2020-10-12 MED ORDER — DEXAMETHASONE 4 MG PO TABS: 8.0000 mg | ORAL_TABLET | Freq: Every day | ORAL | 1 refills | Status: DC

## 2020-10-12 MED ORDER — SODIUM CHLORIDE 0.9 % IV SOLN
10.0000 mg | Freq: Once | INTRAVENOUS | Status: AC
  Administered 2020-10-12: 10 mg via INTRAVENOUS
  Filled 2020-10-12: qty 10

## 2020-10-12 MED ORDER — PALONOSETRON HCL INJECTION 0.25 MG/5ML
0.2500 mg | Freq: Once | INTRAVENOUS | Status: AC
  Administered 2020-10-12: 0.25 mg via INTRAVENOUS
  Filled 2020-10-12: qty 5

## 2020-10-12 MED ORDER — SODIUM CHLORIDE 0.9 % IV SOLN
209.6000 mg | Freq: Once | INTRAVENOUS | Status: AC
  Administered 2020-10-12: 210 mg via INTRAVENOUS
  Filled 2020-10-12: qty 21

## 2020-10-12 MED ORDER — SODIUM CHLORIDE 0.9 % IV SOLN
1800.0000 mg | Freq: Once | INTRAVENOUS | Status: AC
  Administered 2020-10-12: 1800 mg via INTRAVENOUS
  Filled 2020-10-12: qty 26.3

## 2020-10-12 MED ORDER — HEPARIN SOD (PORK) LOCK FLUSH 100 UNIT/ML IV SOLN
INTRAVENOUS | Status: AC
  Filled 2020-10-12: qty 5

## 2020-10-12 MED ORDER — SODIUM CHLORIDE 0.9% FLUSH
10.0000 mL | INTRAVENOUS | Status: DC | PRN
  Administered 2020-10-12: 10 mL via INTRAVENOUS
  Filled 2020-10-12: qty 10

## 2020-10-12 NOTE — Progress Notes (Signed)
Hematology/Oncology Consult note 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 in the past and has undergone breast biopsies which did not previously showed malignancy. More recently patient noted some red discoloration around her right perioral as well as a palpable mass which led to a diagnostic mammogram on the right side her prior mammogram in February 2020 was normal in August 2020 she was noted to have a 2.7 x 2 x 2.3 cm mass in her right breast along with 4 morphologically abnormal lymph nodes. She underwent breast biopsy as well as lymph node biopsy which showed grade 3 invasive mammary carcinoma. ER PR and HER-2/neunegative.Sections show high-grade invasive carcinoma with focal squamous differentiation and pinpoint keratin ideation. The features are concerning for metaplastic differentiation.[name] Lymphovascular invasion present. There was extranodal extension present on the lymph node biopsy.   Her family history is significant for breast cancer in her mother in her 16s. Father had colon cancer in his70s.Family history also significant for ovarian cancer in her maternal aunt.   MRI of bilateral breasts showed: Primary right breast mass was 3.5 x 2.7 x 3.4 cm. There were surrounding numerous nodules consistent with satellite lesions. Extensive nodular non-mass enhancement throughout the right breast involving the upper and lower quadrants spanning 6.2 x 4.5 x 5.5 cm. Markedly enhancement of the left breast diffusely. Non-mass enhancement measures 1.9 x 2.4 cm. At least 3 abnormal lymph nodes in the right axilla. No abnormal left axillary lymph nodes. 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 Equivocal inferior breast and left axillary nodal hypermetabolism. No evidence of extrathoracic hypermetabolic metastases. Lateral right breast primary with right axillary nodal metastases  Patient had 3 cycles of neoadjuvant AC chemotherapy along with Keytruda. There was no significant response in her tumor and patient proceeded with bilateral mastectomy with targeted node dissection. 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 7 lymph nodes were positive for malignancy with extranodal extension overall cancer cellularity was 40% [id number].Patient completed 1 cycle of adjuvant AC chemotherapy and willcomplete weekly Taxol chemotherapy in April 2021. She completed post mastectomy radiation  PET scan in November 2021 was done for ongoing chest wall pain.  It showed:1. Aggressive appearing and extensive recurrent right breast cancer with right chest wall involvement as detailed above. 2. No findings for mediastinal/hilar metastatic lymphadenopathy or metastatic pulmonary disease. 3. Single left hepatic lobe liver lesion consistent with metastatic focus. 4. Hypermetabolic soft tissue mass in the left oropharynx/tonsillar region suspicious for primary neoplasm.   Interval history-patient still reports pain in the right  midsternal area as well as burning pain on the undersurface of her right arm.  She has not started using Dilaudid yet.  ECOG PS-  1 Pain scale- 4 Opioid associated constipation- no  Review of systems- Review of Systems  Constitutional: Negative for chills, fever, malaise/fatigue and weight loss.  HENT: Negative for congestion, ear discharge and nosebleeds.   Eyes: Negative for blurred vision.  Respiratory: Negative for cough, hemoptysis, sputum production, shortness of breath and wheezing.   Cardiovascular: Negative for chest pain, palpitations, orthopnea and claudication.  Gastrointestinal: Negative for abdominal pain, blood in stool, constipation, diarrhea, heartburn, melena, nausea and vomiting.  Genitourinary: Negative for dysuria, flank pain, frequency, hematuria and urgency.  Musculoskeletal: Negative for back pain, joint pain and myalgias.       Right upper extremity and chest wall pain  Skin: Negative for rash.  Neurological: Negative for dizziness, tingling, focal weakness, seizures, weakness and headaches.  Endo/Heme/Allergies: Does not bruise/bleed easily.  Psychiatric/Behavioral: Negative for depression and suicidal ideas. The patient does not have insomnia.       Allergies  Allergen Reactions  . Amitiza [Lubiprostone] Other (See Comments)    Overly effective at higher dose  . Bentyl [Dicyclomine Hcl]     Lack of effect  . Cortisone Other (See Comments)    flush  . Mobic [Meloxicam]     Palpitations.  But can tolerate aleve  . Nitrofurantoin Monohyd Macro   . Sulfonamide Derivatives Nausea Only  . Zoloft [Sertraline Hcl] Diarrhea and Nausea Only  . Buspar [Buspirone] Palpitations  . Celecoxib Rash    REACTION: unspecified  . Doxycycline Palpitations    chest pain  . Penicillins Rash     Past Medical History:  Diagnosis Date  . Anemia    d/t chemo - last Hgb 10.8  . Anxiety    sees Dr. [name]  . BRCA negative    Inv[facility]ae panel neg except CHEK2 VUS  .  Breast cancer ([place]) 2020  . Cyst of breast    per [name]  . Dysrhythmia    Hx of palpitations  . Family history of breast cancer   . Family history of colon cancer   . Family history of leukemia   . Family history of ovarian cancer   . Fibromyalgia   . GERD (gastroesophageal reflux disease)   . Heart murmur   . Hemorrhoids   . Herpes, genital 04/2015   confirmed with HSV 2 IgG  . Hiatal hernia   . Hypercholesterolemia    Dr. Kyra Searles  . Hyperlipidemia   . IBS (irritable bowel syndrome)    constipation predominant  . IC (interstitial cystitis)    per [facility]  . Mild depression ([place])   . MVP (mitral valve prolapse)    [facility] cards eval 2012  . Osteopenia    2010/2017, DEXA at [facility];spine and fem neck  . PONV (postoperative nausea and vomiting)   . Vitamin D deficiency    history of     Past Surgical History:  Procedure Laterality Date  . ABDOMINAL HYSTERECTOMY    . APPENDECTOMY    . BLADDER SURGERY     1980's  . BREAST EXCISIONAL BIOPSY Right    benign  . BREAST EXCISIONAL BIOPSY Bilateral    benign  . COLONOSCOPY  09/2014   Dr. [name]  . COLONOSCOPY  2009   at [facility] 1 POLYP (BENIGN)  . excision of breast cysts     hx of multiple cyst aspirations  . EXPLORATORY LAPAROTOMY    . MASTECTOMY MODIFIED RADICAL Bilateral 10/09/2019   Procedure: RIGHT MASTECTOMY MODIFIED RADICAL AND LEFT TOTAL MASTECTOMY;  Surgeon: Rolm Bookbinder, MD;  Location: [facility] OR;  Service: General;  Laterality: Bilateral;  . PORTA CATH INSERTION N/A 08/06/2019   Procedure: PORTA CATH INSERTION;  Surgeon: Katha Cabal, MD;  Location: [place] [facility];  Service: Cardiovascular;  Laterality: N/A;  . PORTACATH PLACEMENT Left 08/05/2019   Procedure: INSERTION PORT-A-CATH, Attempted;  Surgeon: Jules Husbands, MD;  Location: [facility] ORS;  Service: General;  Laterality: Left;  . TUBAL LIGATION      Social History   Socioeconomic History  . Marital status: Divorced    Spouse name: Not  on file  . Number of children: 1  . Years of education: Not on file  . Highest education level: Not on file  Occupational History  . Occupation: [employer]  Tobacco Use  . Smoking status: Never Smoker  . Smokeless tobacco: Never Used  Vaping Use  . Vaping Use: Never used  Substance and Sexual Activity  . Alcohol use: No    Alcohol/week: 0.0 standard drinks  . Drug use: No  . Sexual activity: Yes    Birth control/protection: Surgical    Comment: Hysterectomy  Other Topics Concern  . Not on file  Social History Narrative   Married in 02/26/97, divorced as of 2017/02/26, 1 son from previous relationship   Brother with MVA at 11, in rest home for many years as of 02/26/18- died of covid recently   Social Determinants of Radio broadcast assistant Strain: [place]   . Difficulty of Paying Living Expenses: Not hard at all  Food Insecurity: No Food Insecurity  . Worried About Charity fundraiser in the Last Year: Never true  . Ran Out of Food in the Last Year: Never true  Transportation Needs: No Transportation Needs  . Lack of Transportation (Medical): No  . Lack of Transportation (Non-Medical): No  Physical Activity: Inactive  . Days of Exercise per Week: 0 days  . Minutes of Exercise per Session: 0 min  Stress: No Stress Concern Present  . Feeling of Stress : Not at all  Social Connections:   . Frequency of Communication with Friends and Family: Not on file  . Frequency of Social Gatherings with Friends and Family: Not on file  . Attends Religious Services: Not on file  . Active Member of Clubs or Organizations: Not on file  . Attends Archivist Meetings: Not on file  . Marital Status: Not on file  Intimate Partner Violence: Not At Risk  . Fear of Current or Ex-Partner: No  . Emotionally Abused: No  . Physically Abused: No  . Sexually Abused: No    Family History  Problem Relation Age of Onset  . Breast cancer Mother 59  . Hypertension Mother   . Colon cancer Father 26   . Hyperlipidemia Father   . Ovarian cancer Maternal Aunt 80  . Breast cancer Cousin   . Gallbladder disease Paternal Grandmother   . Liver cancer Cousin 70       Malignant  . Cancer Maternal Uncle        unsure type     Current Outpatient Medications:  .  acetaminophen (TYLENOL) 650 MG CR tablet, Take 650 mg by mouth every 8 (eight) hours as needed for pain., Disp: , Rfl:  .  Ascorbic Acid (VITAMIN C) 100 MG tablet, Take 100 mg by mouth daily., Disp: , Rfl:  .  atorvastatin (LIPITOR) 40 MG tablet, Take 40 mg by mouth at bedtime. , Disp: , Rfl: 1 .  Calcium Carbonate-Vit  D-Min (CALCIUM 1200 PO), Take 4 tablets by mouth daily., Disp: , Rfl:  .  cholecalciferol (VITAMIN D3) 25 MCG (1000 UNIT) tablet, Take 1 tablet (1,000 Units total) by mouth daily., Disp: , Rfl:  .  clonazePAM (KLONOPIN) 0.5 MG tablet, Take 0.5 mg by mouth 2 (two) times daily. , Disp: , Rfl:  .  Dexlansoprazole 30 MG capsule, Take 1 capsule (30 mg total) by mouth daily., Disp: 30 capsule, Rfl: 6 .  DULoxetine (CYMBALTA) 60 MG capsule, Take 60 mg by mouth daily. , Disp: , Rfl:  .  furosemide (LASIX) 20 MG tablet, Take 1 tablet (20 mg total) by mouth every other day., Disp: , Rfl:  .  gabapentin (NEURONTIN) 100 MG capsule, Take 1-2 capsules (100-200 mg total) by mouth 3 (three) times daily as needed., Disp: 90 capsule, Rfl: 1 .  lidocaine-prilocaine (EMLA) cream, Apply 1 application topically as needed., Disp: 30 g, Rfl: 0 .  loratadine (CLARITIN) 10 MG tablet, Take 1 tablet (10 mg total) by mouth daily., Disp: , Rfl:  .  Plecanatide (TRULANCE) 3 MG TABS, Take 3 mg by mouth daily., Disp: 30 tablet, Rfl: 0 .  sennosides-docusate sodium (SENOKOT-S) 8.6-50 MG tablet, Take 1-2 tablets by mouth at bedtime. , Disp: , Rfl:  .  valACYclovir (VALTREX) 500 MG tablet, Take 1 tablet (500 mg total) by mouth daily., Disp: 90 tablet, Rfl: 3 .  vitamin B-12 (CYANOCOBALAMIN) 500 MCG tablet, Take 500 mcg by mouth daily., Disp: , Rfl:  .   dexamethasone (DECADRON) 4 MG tablet, Take 2 tablets (8 mg total) by mouth daily. Start the day after chemotherapy for 2 days. Take with food., Disp: 30 tablet, Rfl: 1 .  HYDROmorphone HCl (DILAUDID) 1 MG/ML LIQD, Take 0.5 mLs (0.5 mg total) by mouth every 4 (four) hours as needed for severe pain. (Patient not taking: Reported on 10/12/2020), Disp: 90 mL, Rfl: 0 .  lidocaine-prilocaine (EMLA) cream, Apply to affected area once, Disp: 30 g, Rfl: 3 .  ondansetron (ZOFRAN) 8 MG tablet, Take 1 tablet (8 mg total) by mouth 2 (two) times daily as needed for refractory nausea / vomiting. Start on day 3 after chemotherapy., Disp: 30 tablet, Rfl: 1 .  prochlorperazine (COMPAZINE) 10 MG tablet, Take 1 tablet (10 mg total) by mouth every 6 (six) hours as needed (Nausea or vomiting)., Disp: 30 tablet, Rfl: 1 .  sucralfate (CARAFATE) 1 g tablet, Take 1 g by mouth 3 (three) times daily.  (Patient not taking: Reported on 09/24/2020), Disp: , Rfl:  No current facility-administered medications for this visit.  Facility-Administered Medications Ordered in Other Visits:  .  heparin lock flush 100 unit/mL, 500 Units, Intravenous, Once, Sindy Guadeloupe, MD .  sodium chloride flush (NS) 0.9 % injection 10 mL, 10 mL, Intravenous, PRN, Sindy Guadeloupe, MD, 10 mL at 10/12/20 0830  Physical exam:  Vitals:   10/12/20 0843  BP: 120/65  Pulse: 84  Resp: 16  Temp: 97.9 F (36.6 C)  TempSrc: Tympanic  SpO2: 99%  Weight: 160 lb 4.8 oz (72.7 kg)   Physical Exam Cardiovascular:     Rate and Rhythm: Normal rate.     Heart sounds: Normal heart sounds.  Pulmonary:     Effort: Pulmonary effort is normal.     Breath sounds: Normal breath sounds.  Skin:    General: Skin is warm and dry.  Neurological:     Mental Status: She is alert and oriented to person, place, and time.  CMP Latest Ref Rng & Units 10/12/2020  Glucose 70 - 99 mg/dL 107(H)  BUN 8 - 23 mg/dL 13  Creatinine 0.44 - 1.00 mg/dL 0.84  Sodium 135 -  145 mmol/L 136  Potassium 3.5 - 5.1 mmol/L 3.7  Chloride 98 - 111 mmol/L 99  CO2 22 - 32 mmol/L 28  Calcium 8.9 - 10.3 mg/dL 8.9  Total Protein 6.5 - 8.1 g/dL 7.1  Total Bilirubin 0.3 - 1.2 mg/dL 0.5  Alkaline Phos 38 - 126 U/L 70  AST 15 - 41 U/L 16  ALT 0 - 44 U/L 12   CBC Latest Ref Rng & Units 10/12/2020  WBC 4.0 - 10.5 K/uL 12.8(H)  Hemoglobin 12.0 - 15.0 g/dL 10.9(L)  Hematocrit 36 - 46 % 32.8(L)  Platelets 150 - 400 K/uL 319    No images are attached to the encounter.  NM PET Image Restag (PS) Skull Base To Thigh  Result Date: 10/08/2020 CLINICAL DATA:  Subsequent treatment strategy for recurrent right breast cancer. EXAM: NUCLEAR MEDICINE PET SKULL BASE TO THIGH TECHNIQUE: 8.79 mCi F-18 FDG was injected intravenously. Full-ring PET imaging was performed from the skull base to thigh after the radiotracer. CT data was obtained and used for attenuation correction and anatomic localization. Fasting blood glucose: 81 mg/dl COMPARISON:  PET-CT 08/06/2019 FINDINGS: Mediastinal blood pool activity: SUV max 2.41 Liver activity: SUV max NA NECK: There is an ill-defined soft tissue mass in the left oropharynx/tonsillar region measuring approximately 16 x 15 mm. It is hypermetabolic with SUV max of 9.48. This would be an unusual place for metastatic disease. It could be a primary mucosal tonsillar lesion. Recommend direct visualization and biopsy if indicated. No neck adenopathy. Incidental CT findings: none CHEST: Right-sided subclavicular soft tissue mass measuring approximately 14 mm. SUV max is 15.52. There is also a second soft tissue mass in the right subpectoral region measuring 24 mm with an SUV max of 10.53. Findings consistent with metastatic disease. Right-sided anterior chest wall mass measures 24 mm on image number 54/6 is hypermetabolic with SUV max of 27.03. 2.7 cm mass in the right anterior chest wall in the region the internal mammary vessels and lymph nodes. There is associated  destructive bony changes involving the right aspect of the sternum. I suspect this is most likely an aggressive nodal metastasis invading/destroying the sternum. SUV max is 12.07. In the lower right anterolateral chest wall/lower right breast area there is an ill-defined 22 mm soft tissue mass with hypermetabolism and SUV max of 12.72. No enlarged or hypermetabolic mediastinal or hilar lymph nodes and no worrisome hypermetabolic pulmonary lesions to suggest metastatic disease. Incidental CT findings: Stable radiation changes involving the anterior aspect of the right lung. ABDOMEN/PELVIS: Single low-attenuation lesion in the left hepatic lobe anteriorly in segment 3. This measures approximately 13 mm and is hypermetabolic with SUV max of 5.00. Findings consistent with a metastatic focus. No findings for metastatic disease involving the adrenal glands. No enlarged or hypermetabolic abdominal/pelvic lymph nodes. Incidental CT findings: none SKELETON: No focal hypermetabolic activity to suggest skeletal metastasis. Incidental CT findings: none IMPRESSION: 1. Aggressive appearing and extensive recurrent right breast cancer with right chest wall involvement as detailed above. 2. No findings for mediastinal/hilar metastatic lymphadenopathy or metastatic pulmonary disease. 3. Single left hepatic lobe liver lesion consistent with metastatic focus. 4. Hypermetabolic soft tissue mass in the left oropharynx/tonsillar region suspicious for primary neoplasm. Recommend ENT consultation and direct visualization and biopsy as indicated. 5. No findings suspicious for osseous  metastatic disease. Electronically Signed   By: Marijo Sanes M.D.   On: 10/08/2020 09:33   US Breast Limited Uni Right Inc Axilla  Result Date: 09/27/2020 CLINICAL DATA:  64 year old female who had bilateral mastectomies for breast cancer in November of 2020. Patient complains of a palpable abnormality at the lateral margin of the mastectomy scar in the  lower axillary region. EXAM: ULTRASOUND OF THE RIGHT BREAST COMPARISON:  Previous exam(s). FINDINGS: On physical exam, I palpate a discrete mass in the lower right axilla at the lateral margin of the mastectomy scar. Targeted ultrasound is performed, showing a vascular hypoechoic mass in the lower right axilla measuring 1.6 x 1.1 x 1.1 cm. No additional masses are seen in the axillary region. IMPRESSION: Suspicious mass in the lower right axilla at the margin of the mastectomy scar. RECOMMENDATION: Ultrasound-guided core biopsy of the right breast/axillary mass is recommended. I have discussed the findings and recommendations with the patient. If applicable, a reminder letter will be sent to the patient regarding the next appointment. BI-RADS CATEGORY  4: Suspicious. Electronically Signed   By: Lillia Mountain M.D.   On: 09/27/2020 14:08   [place] RT BREAST BX W LOC DEV 1ST LESION IMG BX SPEC US GUIDE  Addendum Date: 10/04/2020   ADDENDUM REPORT: 10/01/2020 12:55 ADDENDUM: PATHOLOGY revealed: A. BREAST, RIGHT LATERAL AND BELOW AXILLA; ULTRASOUND-GUIDED BIOPSY: - HIGH-GRADE MAMMARY CARCINOMA MORPHOLOGICALLY CONSISTENT WITH PATIENT'S PRIOR RIGHT BREAST CARCINOMA. - COMMENT: Sections demonstrate cores of fibroadipose tissue and skeletal muscle involved by high-grade mammary carcinoma that is morphologically consistent with the patient's prior right breast carcinoma. Background mammary epithelium and lymph node tissue are not identified. Slides on the patient's prior right breast biopsy ([id number]) were reviewed in conjunction with this case. Pathology results are CONCORDANT with imaging findings, per Dr. Lajean Manes. Pathology results and recommendations below were discussed with patient by her oncology provider Leafy Kindle, NP) on 10/01/2020. Patient reported biopsy site within normal limits. Post biopsy care instructions were reviewed and questions were answered. Recommend surgical consultation and continue oncological  support. Patient currently under care of oncologist (Dr. Randa Evens) and surgeon (Dr. Rolm Bookbinder); both physicians are aware of biopsy results and on 10/01/2020 they contacted patient regarding clinical follow up appointments/plan. Pathology results reported by Electa Sniff RN on 10/01/2020. Electronically Signed   By: Lajean Manes M.D.   On: 10/01/2020 12:55   Result Date: 10/04/2020 CLINICAL DATA:  Patient presents for ultrasound-guided core needle biopsy of the mass along the lateral aspect of the patient's right mastectomy scar, just below her right axilla. Patient has a history of bilateral mastectomies for breast carcinoma, diagnosed in 2020. EXAM: ULTRASOUND GUIDED RIGHT BREAST CORE NEEDLE BIOPSY COMPARISON:  Previous exam(s). PROCEDURE: I met with the patient and we discussed the procedure of ultrasound-guided biopsy, including benefits and alternatives. We discussed the high likelihood of a successful procedure. We discussed the risks of the procedure, including infection, bleeding, tissue injury, clip migration, and inadequate sampling. Informed written consent was given. The usual time-out protocol was performed immediately prior to the procedure. Lesion quadrant: Upper outer quadrant: Along the lateral mastectomy scar just inferior to the right axilla. Using sterile technique and 1% Lidocaine as local anesthetic, under direct ultrasound visualization, a 14 gauge spring-loaded device was used to perform biopsy of palpable mass using an inferior approach. At the conclusion of the procedure [facility] [place] tissue marker clip was deployed into the biopsy cavity. The post biopsy marker clip was well seen within the mass  sonographically. No postprocedure mammogram was performed. IMPRESSION: Ultrasound guided biopsy of a mass along the lateral aspect of the patient's right mastectomy scar just below the right axilla. No apparent complications. Electronically Signed: By: Lajean Manes M.D. On: 09/30/2020 09:10      Assessment and plan- Patient is a 64 y.o. female with recurrent triple negative metaplastic right breast cancer s/p right mastectomy now with chest wall recurrence as well as metastatic liver disease here for on treatment assessment prior to cycle 1 day 1 of carboplatin and gemcitabine  Repeat chest wall nodule biopsy confirms triple negative metaplastic breast cancer.  NGS testing is pending.  Counts okay to proceed with cycle 1 day 1 of carboplatin AUC 2 and gemcitabine 1000 mg per metered squared today.  She will return to clinic in 1 week for cycle 1 day 8 same treatment with on for Neulasta support.  Treatment will be given with a palliative intent.  Again discussed risks and benefits of chemotherapy including all but not limited to nausea, vomiting, low blood counts, risk of infections and hospitalizations.  Patient understands and agrees to proceed as planned  Neoplasm related pain: I have encouraged her to try low-dose Dilaudid 0.5 mg Every 4 hours as needed for pain.  I will reassess her pain in 1 week's time and decide about adding any long-acting pain medications at that time.  She is also taking Cymbalta 60 mg daily.  She has been taking gabapentin as needed and I have asked her to take that on a consistent basis.  Etiology of left tonsillar mass is currently unclear if primary malignancy versus metastatic.  She does see Dr Tami Ribas for her thyroid nodules but I will hold off on getting a tonsillar biopsy at this time.  If there is continued growth of this lesion on subsequent scans it may be worthwhile to biopsy that..  Her overall prognosis is presently determined by her underlying breast cancer.  Visit Diagnosis 1. Malignant neoplasm of lower-outer quadrant of right breast of female, estrogen receptor negative ([place])   2. Neoplasm related pain   3. Encounter for antineoplastic chemotherapy      Dr. Randa Evens, MD, MPH [facility] at [facility] [phone]  10/12/2020 12:30 PM

## 2020-10-12 NOTE — Telephone Encounter (Signed)
Returned patient's call regarding CARE program. Patient requested to end her attendance with CARE and cancel any future appointments. Discussed the option with patient to come at least 1x/week to help sustain energy levels, receive social support from the group, etc.. Patient declined at this time but said she will think about it over the next couple of weeks depending how she feels and will keep in touch.

## 2020-10-12 NOTE — Progress Notes (Signed)
Pt having pain at right arm and breast area and she has some oozing from bx spot at breast- she has an appt to see surgeon about it. She can put a pillow under her arm and it feels better. When she is in her recliner she does better as far as pain.appetite about 75 to 80% per pt. Drinks a lot of water. Sometimes the pain makes her break out in sweat and gets weak. But then it gets back to where she is feeling ok. Pt wants to try tramadol instead of dilaudid for min.pain.

## 2020-10-12 NOTE — Progress Notes (Signed)
Pt received carbplatin and gemzar tx for the first time today. Tolerated well. Port deaccessed at discharge.

## 2020-10-13 LAB — CANCER ANTIGEN 27.29: CA 27.29: 8.7 U/mL (ref 0.0–38.6)

## 2020-10-13 LAB — CANCER ANTIGEN 15-3: CA 15-3: 12 U/mL (ref 0.0–25.0)

## 2020-10-14 ENCOUNTER — Other Ambulatory Visit: Payer: Self-pay | Admitting: *Deleted

## 2020-10-14 ENCOUNTER — Telehealth: Payer: Self-pay | Admitting: *Deleted

## 2020-10-14 ENCOUNTER — Other Ambulatory Visit: Payer: Self-pay | Admitting: Nurse Practitioner

## 2020-10-14 MED ORDER — LACTULOSE 10 GM/15ML PO SOLN: 30.0000 g | Freq: Two times a day (BID) | ORAL | 1 refills | Status: AC

## 2020-10-14 NOTE — Progress Notes (Signed)
Hematology/Oncology Consult note [facility]  Telephone:([phone] [phone]  Patient Care Team: Tonia Ghent, MD as PCP - General (Family Medicine) Bary Castilla, Forest Gleason, MD (General Surgery) Modesto Charon, MD (Family Medicine) Laneta Simmers as Physician Assistant (Urology) [name] Javier Docker, MD as Consulting Physician (Cardiology) Sindy Guadeloupe, MD as Consulting Physician (Hematology and Oncology) Noreene Filbert, MD as Referring Physician (Radiation Oncology)   Name of the patient: Debra Little  [id number]  10-30-1956   Date of visit: 10/14/20  Diagnosis- 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 in the past and has undergone breast biopsies which did not previously showed malignancy. More recently patient noted some red discoloration around her right perioral as well as a palpable mass which led to a diagnostic mammogram on the right side her prior mammogram in February 2020 was normal in August 2020 she was noted to have a 2.7 x 2 x 2.3 cm mass in her right breast along with 4 morphologically abnormal lymph nodes. She underwent breast biopsy as well as lymph node biopsy which showed grade 3 invasive mammary carcinoma. ER PR and HER-2/neunegative.Sections show high-grade invasive carcinoma with focal squamous differentiation and pinpoint keratin ideation. The features are concerning for metaplastic differentiation.[name] Lymphovascular invasion present. There was extranodal extension present on the lymph node biopsy.   Her family history is significant for breast cancer in her mother in her 59s. Father had colon cancer in his70s.Family history also significant for ovarian cancer in her maternal aunt.  MRI of bilateral breasts  showed: Primary right breast mass was 3.5 x 2.7 x 3.4 cm. There were surrounding numerous nodules consistent with satellite lesions. Extensive nodular non-mass enhancement throughout the right breast involving the upper and lower quadrants spanning 6.2 x 4.5 x 5.5 cm. Markedly enhancement of the left breast diffusely. Non-mass enhancement measures 1.9 x 2.4 cm. At least 3 abnormal lymph nodes in the right axilla. No abnormal left axillary lymph nodes. 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 Equivocal inferior breast and left axillary nodal hypermetabolism. No evidence of extrathoracic hypermetabolic metastases. Lateral right breast primary with right axillary nodal metastases  Patient had 3 cycles of neoadjuvant AC chemotherapy along with Keytruda. There was no significant response in her tumor and patient proceeded with bilateral mastectomy with targeted node dissection. 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 7 lymph nodes were positive for malignancy with extranodal extension overall cancer cellularity was 40% [id number].Patient completed 1 cycle of adjuvant AC chemotherapy and willcomplete weekly Taxol chemotherapy in April 2021. She completed post mastectomy radiation  PET scan in November 2021 was done for ongoing chest wall pain.  It showed:1. Aggressive appearing and extensive recurrent right breast cancer with right chest wall involvement as detailed above. 2. No findings for mediastinal/hilar metastatic lymphadenopathy or metastatic pulmonary disease. 3. Single left hepatic lobe liver lesion consistent with metastatic focus. 4. Hypermetabolic soft tissue mass in the left oropharynx/tonsillar region suspicious for primary neoplasm.   Interval history-patient is here with her partner anxious about the results of PET scan.   She reports burning pain under her right arm as well as along the chest wall.  ECOG PS- 1 Pain scale- 5  Opioid associated constipation- no  Review of systems- Review of Systems  Constitutional: Positive for malaise/fatigue. Negative for chills, fever and weight loss.  HENT: Negative for congestion, ear discharge and nosebleeds.   Eyes: Negative for blurred vision.  Respiratory: Negative for cough, hemoptysis, sputum production, shortness of breath and wheezing.   Cardiovascular: Negative for chest pain, palpitations, orthopnea and claudication.  Gastrointestinal: Negative for abdominal pain, blood in stool, constipation, diarrhea, heartburn, melena, nausea and vomiting.  Genitourinary: Negative for dysuria, flank pain, frequency, hematuria and urgency.  Musculoskeletal: Negative for back pain, joint pain and myalgias.       Right chest wall pain and pain along the right arm  Skin: Negative for rash.  Neurological: Negative for dizziness, tingling, focal weakness, seizures, weakness and headaches.  Endo/Heme/Allergies: Does not bruise/bleed easily.  Psychiatric/Behavioral: Negative for depression and suicidal ideas. The patient does not have insomnia.        Allergies  Allergen Reactions  . Amitiza [Lubiprostone] Other (See Comments)    Overly effective at higher dose  . Bentyl [Dicyclomine Hcl]     Lack of effect  . Cortisone Other (See Comments)    flush  . Mobic [Meloxicam]     Palpitations.  But can tolerate aleve  . Nitrofurantoin Monohyd Macro   . Sulfonamide Derivatives Nausea Only  . Zoloft [Sertraline Hcl] Diarrhea and Nausea Only  . Buspar [Buspirone] Palpitations  . Celecoxib Rash    REACTION: unspecified  . Doxycycline Palpitations    chest pain  . Penicillins Rash     Past Medical History:  Diagnosis Date  . Anemia    d/t chemo - last Hgb 10.8  . Anxiety    sees Dr. [name]  . BRCA negative    [facility]ae panel neg except CHEK2 VUS  . Breast cancer ([place])  2020  . Cyst of breast    per [name]  . Dysrhythmia    Hx of palpitations  . Family history of breast cancer   . Family history of colon cancer   . Family history of leukemia   . Family history of ovarian cancer   . Fibromyalgia   . GERD (gastroesophageal reflux disease)   . Heart murmur   . Hemorrhoids   . Herpes, genital 04/2015   confirmed with HSV 2 IgG  . Hiatal hernia   . Hypercholesterolemia    Dr. Kyra Searles  . Hyperlipidemia   . IBS (irritable bowel syndrome)    constipation predominant  . IC (interstitial cystitis)    per [facility]  . Mild depression ([place])   . MVP (mitral valve prolapse)    [facility] cards eval 2012  . Osteopenia    2010/2017, DEXA at [facility];spine and fem neck  . PONV (postoperative nausea and vomiting)   . Vitamin D deficiency    history of     Past Surgical History:  Procedure Laterality Date  . ABDOMINAL HYSTERECTOMY    . APPENDECTOMY    . BLADDER SURGERY     1980's  . BREAST EXCISIONAL BIOPSY Right    benign  . BREAST EXCISIONAL BIOPSY Bilateral    benign  . COLONOSCOPY  09/2014   Dr. [name]  . COLONOSCOPY  2009   at [facility] 1 POLYP (BENIGN)  . excision of breast cysts     hx of multiple cyst aspirations  . EXPLORATORY LAPAROTOMY    . MASTECTOMY MODIFIED RADICAL Bilateral 10/09/2019   Procedure: RIGHT MASTECTOMY MODIFIED RADICAL AND LEFT TOTAL MASTECTOMY;  Surgeon: Donne Hazel,  Rodman Key, MD;  Location: [facility];  Service: General;  Laterality: Bilateral;  . PORTA CATH INSERTION N/A 08/06/2019   Procedure: PORTA CATH INSERTION;  Surgeon: Katha Cabal, MD;  Location: [place] [facility];  Service: Cardiovascular;  Laterality: N/A;  . PORTACATH PLACEMENT Left 08/05/2019   Procedure: INSERTION PORT-A-CATH, Attempted;  Surgeon: Jules Husbands, MD;  Location: [facility];  Service: General;  Laterality: Left;  . TUBAL LIGATION      Social History   Socioeconomic History  . Marital status: Divorced    Spouse name: Not on file  . Number  of children: 1  . Years of education: Not on file  . Highest education level: Not on file  Occupational History  . Occupation: [employer]  Tobacco Use  . Smoking status: Never Smoker  . Smokeless tobacco: Never Used  Vaping Use  . Vaping Use: Never used  Substance and Sexual Activity  . Alcohol use: No    Alcohol/week: 0.0 standard drinks  . Drug use: No  . Sexual activity: Yes    Birth control/protection: Surgical    Comment: Hysterectomy  Other Topics Concern  . Not on file  Social History Narrative   Married in Feb 23, 1997, divorced as of 2017/02/23, 1 son from previous relationship   Brother with MVA at 74, in rest home for many years as of 2018/02/23- died of covid recently   Social Determinants of Radio broadcast assistant Strain: [place]   . Difficulty of Paying Living Expenses: Not hard at all  Food Insecurity: No Food Insecurity  . Worried About Charity fundraiser in the Last Year: Never true  . Ran Out of Food in the Last Year: Never true  Transportation Needs: No Transportation Needs  . Lack of Transportation (Medical): No  . Lack of Transportation (Non-Medical): No  Physical Activity: Inactive  . Days of Exercise per Week: 0 days  . Minutes of Exercise per Session: 0 min  Stress: No Stress Concern Present  . Feeling of Stress : Not at all  Social Connections:   . Frequency of Communication with Friends and Family: Not on file  . Frequency of Social Gatherings with Friends and Family: Not on file  . Attends Religious Services: Not on file  . Active Member of Clubs or Organizations: Not on file  . Attends Archivist Meetings: Not on file  . Marital Status: Not on file  Intimate Partner Violence: Not At Risk  . Fear of Current or Ex-Partner: No  . Emotionally Abused: No  . Physically Abused: No  . Sexually Abused: No    Family History  Problem Relation Age of Onset  . Breast cancer Mother 87  . Hypertension Mother   . Colon cancer Father 72  . Hyperlipidemia  Father   . Ovarian cancer Maternal Aunt 80  . Breast cancer Cousin   . Gallbladder disease Paternal Grandmother   . Liver cancer Cousin 70       Malignant  . Cancer Maternal Uncle        unsure type     Current Outpatient Medications:  .  acetaminophen (TYLENOL) 650 MG CR tablet, Take 650 mg by mouth every 8 (eight) hours as needed for pain., Disp: , Rfl:  .  Ascorbic Acid (VITAMIN C) 100 MG tablet, Take 100 mg by mouth daily., Disp: , Rfl:  .  atorvastatin (LIPITOR) 40 MG tablet, Take 40 mg by mouth at bedtime. , Disp: , Rfl: 1 .  Calcium Carbonate-Vit D-Min (CALCIUM 1200 PO), Take 4 tablets by mouth daily., Disp: , Rfl:  .  cholecalciferol (VITAMIN D3) 25 MCG (1000 UNIT) tablet, Take 1 tablet (1,000 Units total) by mouth daily., Disp: , Rfl:  .  clonazePAM (KLONOPIN) 0.5 MG tablet, Take 0.5 mg by mouth 2 (two) times daily. , Disp: , Rfl:  .  Dexlansoprazole 30 MG capsule, Take 1 capsule (30 mg total) by mouth daily., Disp: 30 capsule, Rfl: 6 .  DULoxetine (CYMBALTA) 60 MG capsule, Take 60 mg by mouth daily. , Disp: , Rfl:  .  furosemide (LASIX) 20 MG tablet, Take 1 tablet (20 mg total) by mouth every other day., Disp: , Rfl:  .  gabapentin (NEURONTIN) 100 MG capsule, Take 1-2 capsules (100-200 mg total) by mouth 3 (three) times daily as needed., Disp: 90 capsule, Rfl: 1 .  loratadine (CLARITIN) 10 MG tablet, Take 1 tablet (10 mg total) by mouth daily., Disp: , Rfl:  .  Plecanatide (TRULANCE) 3 MG TABS, Take 3 mg by mouth daily., Disp: 30 tablet, Rfl: 0 .  valACYclovir (VALTREX) 500 MG tablet, Take 1 tablet (500 mg total) by mouth daily., Disp: 90 tablet, Rfl: 3 .  vitamin B-12 (CYANOCOBALAMIN) 500 MCG tablet, Take 500 mcg by mouth daily., Disp: , Rfl:  .  dexamethasone (DECADRON) 4 MG tablet, Take 2 tablets (8 mg total) by mouth daily. Start the day after chemotherapy for 2 days. Take with food., Disp: 30 tablet, Rfl: 1 .  HYDROmorphone HCl (DILAUDID) 1 MG/ML LIQD, Take 0.5 mLs (0.5 mg  total) by mouth every 4 (four) hours as needed for severe pain. (Patient not taking: Reported on 10/12/2020), Disp: 90 mL, Rfl: 0 .  lidocaine-prilocaine (EMLA) cream, Apply 1 application topically as needed., Disp: 30 g, Rfl: 0 .  lidocaine-prilocaine (EMLA) cream, Apply to affected area once, Disp: 30 g, Rfl: 3 .  ondansetron (ZOFRAN) 8 MG tablet, Take 1 tablet (8 mg total) by mouth 2 (two) times daily as needed for refractory nausea / vomiting. Start on day 3 after chemotherapy., Disp: 30 tablet, Rfl: 1 .  prochlorperazine (COMPAZINE) 10 MG tablet, Take 1 tablet (10 mg total) by mouth every 6 (six) hours as needed (Nausea or vomiting)., Disp: 30 tablet, Rfl: 1 .  sennosides-docusate sodium (SENOKOT-S) 8.6-50 MG tablet, Take 1-2 tablets by mouth at bedtime. , Disp: , Rfl:  .  sucralfate (CARAFATE) 1 g tablet, Take 1 g by mouth 3 (three) times daily.  (Patient not taking: Reported on 09/24/2020), Disp: , Rfl:   Physical exam:  Vitals:   10/08/20 1406  BP: 120/61  Pulse: 87  Resp: 16  Temp: 97.6 F (36.4 C)  TempSrc: Tympanic  SpO2: 99%  Weight: 160 lb 9.6 oz (72.8 kg)   Physical Exam HENT:     Head: Normocephalic and atraumatic.  Eyes:     Pupils: Pupils are equal, round, and reactive to light.  Cardiovascular:     Rate and Rhythm: Normal rate and regular rhythm.     Heart sounds: Normal heart sounds.  Pulmonary:     Effort: Pulmonary effort is normal.     Breath sounds: Normal breath sounds.  Abdominal:     General: Bowel sounds are normal.     Palpations: Abdomen is soft.  Musculoskeletal:        General: No swelling.     Cervical back: Normal range of motion.  Skin:    General: Skin is warm and dry.  Neurological:  Mental Status: She is alert.     Patient is s/p bilateral mastectomy with a well-healed surgical scar and a small palpable chest wall nodule on the right side which has been recently biopsied   CMP Latest Ref Rng & Units 10/12/2020  Glucose 70 - 99  mg/dL 107(H)  BUN 8 - 23 mg/dL 13  Creatinine 0.44 - 1.00 mg/dL 0.84  Sodium 135 - 145 mmol/L 136  Potassium 3.5 - 5.1 mmol/L 3.7  Chloride 98 - 111 mmol/L 99  CO2 22 - 32 mmol/L 28  Calcium 8.9 - 10.3 mg/dL 8.9  Total Protein 6.5 - 8.1 g/dL 7.1  Total Bilirubin 0.3 - 1.2 mg/dL 0.5  Alkaline Phos 38 - 126 U/L 70  AST 15 - 41 U/L 16  ALT 0 - 44 U/L 12   CBC Latest Ref Rng & Units 10/12/2020  WBC 4.0 - 10.5 K/uL 12.8(H)  Hemoglobin 12.0 - 15.0 g/dL 10.9(L)  Hematocrit 36 - 46 % 32.8(L)  Platelets 150 - 400 K/uL 319      NM PET Image Restag (PS) Skull Base To Thigh  Result Date: 10/08/2020 CLINICAL DATA:  Subsequent treatment strategy for recurrent right breast cancer. EXAM: NUCLEAR MEDICINE PET SKULL BASE TO THIGH TECHNIQUE: 8.79 mCi F-18 FDG was injected intravenously. Full-ring PET imaging was performed from the skull base to thigh after the radiotracer. CT data was obtained and used for attenuation correction and anatomic localization. Fasting blood glucose: 81 mg/dl COMPARISON:  PET-CT 08/06/2019 FINDINGS: Mediastinal blood pool activity: SUV max 2.41 Liver activity: SUV max NA NECK: There is an ill-defined soft tissue mass in the left oropharynx/tonsillar region measuring approximately 16 x 15 mm. It is hypermetabolic with SUV max of 1.44. This would be an unusual place for metastatic disease. It could be a primary mucosal tonsillar lesion. Recommend direct visualization and biopsy if indicated. No neck adenopathy. Incidental CT findings: none CHEST: Right-sided subclavicular soft tissue mass measuring approximately 14 mm. SUV max is 15.52. There is also a second soft tissue mass in the right subpectoral region measuring 24 mm with an SUV max of 10.53. Findings consistent with metastatic disease. Right-sided anterior chest wall mass measures 24 mm on image number 31/5 is hypermetabolic with SUV max of 40.08. 2.7 cm mass in the right anterior chest wall in the region the internal mammary  vessels and lymph nodes. There is associated destructive bony changes involving the right aspect of the sternum. I suspect this is most likely an aggressive nodal metastasis invading/destroying the sternum. SUV max is 12.07. In the lower right anterolateral chest wall/lower right breast area there is an ill-defined 22 mm soft tissue mass with hypermetabolism and SUV max of 12.72. No enlarged or hypermetabolic mediastinal or hilar lymph nodes and no worrisome hypermetabolic pulmonary lesions to suggest metastatic disease. Incidental CT findings: Stable radiation changes involving the anterior aspect of the right lung. ABDOMEN/PELVIS: Single low-attenuation lesion in the left hepatic lobe anteriorly in segment 3. This measures approximately 13 mm and is hypermetabolic with SUV max of 6.76. Findings consistent with a metastatic focus. No findings for metastatic disease involving the adrenal glands. No enlarged or hypermetabolic abdominal/pelvic lymph nodes. Incidental CT findings: none SKELETON: No focal hypermetabolic activity to suggest skeletal metastasis. Incidental CT findings: none IMPRESSION: 1. Aggressive appearing and extensive recurrent right breast cancer with right chest wall involvement as detailed above. 2. No findings for mediastinal/hilar metastatic lymphadenopathy or metastatic pulmonary disease. 3. Single left hepatic lobe liver lesion consistent with metastatic  focus. 4. Hypermetabolic soft tissue mass in the left oropharynx/tonsillar region suspicious for primary neoplasm. Recommend ENT consultation and direct visualization and biopsy as indicated. 5. No findings suspicious for osseous metastatic disease. Electronically Signed   By: Marijo Sanes M.D.   On: 10/08/2020 09:33   US Breast Limited Uni Right Inc Axilla  Result Date: 09/27/2020 CLINICAL DATA:  64 year old female who had bilateral mastectomies for breast cancer in November of 2020. Patient complains of a palpable abnormality at the  lateral margin of the mastectomy scar in the lower axillary region. EXAM: ULTRASOUND OF THE RIGHT BREAST COMPARISON:  Previous exam(s). FINDINGS: On physical exam, I palpate a discrete mass in the lower right axilla at the lateral margin of the mastectomy scar. Targeted ultrasound is performed, showing a vascular hypoechoic mass in the lower right axilla measuring 1.6 x 1.1 x 1.1 cm. No additional masses are seen in the axillary region. IMPRESSION: Suspicious mass in the lower right axilla at the margin of the mastectomy scar. RECOMMENDATION: Ultrasound-guided core biopsy of the right breast/axillary mass is recommended. I have discussed the findings and recommendations with the patient. If applicable, a reminder letter will be sent to the patient regarding the next appointment. BI-RADS CATEGORY  4: Suspicious. Electronically Signed   By: Lillia Mountain M.D.   On: 09/27/2020 14:08   [place] RT BREAST BX W LOC DEV 1ST LESION IMG BX SPEC US GUIDE  Addendum Date: 10/04/2020   ADDENDUM REPORT: 10/01/2020 12:55 ADDENDUM: PATHOLOGY revealed: A. BREAST, RIGHT LATERAL AND BELOW AXILLA; ULTRASOUND-GUIDED BIOPSY: - HIGH-GRADE MAMMARY CARCINOMA MORPHOLOGICALLY CONSISTENT WITH PATIENT'S PRIOR RIGHT BREAST CARCINOMA. - COMMENT: Sections demonstrate cores of fibroadipose tissue and skeletal muscle involved by high-grade mammary carcinoma that is morphologically consistent with the patient's prior right breast carcinoma. Background mammary epithelium and lymph node tissue are not identified. Slides on the patient's prior right breast biopsy ([id number]) were reviewed in conjunction with this case. Pathology results are CONCORDANT with imaging findings, per Dr. Lajean Manes. Pathology results and recommendations below were discussed with patient by her oncology provider Leafy Kindle, NP) on 10/01/2020. Patient reported biopsy site within normal limits. Post biopsy care instructions were reviewed and questions were answered. Recommend  surgical consultation and continue oncological support. Patient currently under care of oncologist (Dr. Randa Evens) and surgeon (Dr. Rolm Bookbinder); both physicians are aware of biopsy results and on 10/01/2020 they contacted patient regarding clinical follow up appointments/plan. Pathology results reported by Electa Sniff RN on 10/01/2020. Electronically Signed   By: Lajean Manes M.D.   On: 10/01/2020 12:55   Result Date: 10/04/2020 CLINICAL DATA:  Patient presents for ultrasound-guided core needle biopsy of the mass along the lateral aspect of the patient's right mastectomy scar, just below her right axilla. Patient has a history of bilateral mastectomies for breast carcinoma, diagnosed in 2020. EXAM: ULTRASOUND GUIDED RIGHT BREAST CORE NEEDLE BIOPSY COMPARISON:  Previous exam(s). PROCEDURE: I met with the patient and we discussed the procedure of ultrasound-guided biopsy, including benefits and alternatives. We discussed the high likelihood of a successful procedure. We discussed the risks of the procedure, including infection, bleeding, tissue injury, clip migration, and inadequate sampling. Informed written consent was given. The usual time-out protocol was performed immediately prior to the procedure. Lesion quadrant: Upper outer quadrant: Along the lateral mastectomy scar just inferior to the right axilla. Using sterile technique and 1% Lidocaine as local anesthetic, under direct ultrasound visualization, a 14 gauge spring-loaded device was used to perform biopsy of palpable mass  using an inferior approach. At the conclusion of the procedure [facility] tissue marker clip was deployed into the biopsy cavity. The post biopsy marker clip was well seen within the mass sonographically. No postprocedure mammogram was performed. IMPRESSION: Ultrasound guided biopsy of a mass along the lateral aspect of the patient's right mastectomy scar just below the right axilla. No apparent complications. Electronically  Signed: By: Lajean Manes M.D. On: 09/30/2020 09:10     Assessment and plan- Patient is a 64 y.o. female with history of stage III triple negative metaplastic right breast cancer s/p bilateral mastectomy now with evidence of chest wall recurrence as well as solitary liver met  I have reviewed PET/CT scan images independently and discussed findings with the patient.  PET CT scan shows evidence of Chest wall involvement involving the medial aspect and the right sternal area, 2.7 cm right anterior chest wall mass in the region of internal mammary vessels and lymph nodes right-sided supraclavicular soft tissue mass measuring 14 mm with an SUV of 15.52-second soft tissue mass in the subpectoral region measuring 2.4 cm.  Hypermetabolism involving the left oropharynx.  One of the chest wall nodules was biopsied and consistent with invasive mammary carcinoma and ER/PR and HER-2 were not pending at the time of my visit.  Morphologically it looks similar to her prior pathology and will likely be triple negative.  PET scan also showed a 13 mm left hepatic lesion consistent with a metastatic focus.  She "does not have resectable disease at this time.  I would recommend systemic chemotherapy with carboplatin AUC 2 and gemcitabine 1000 mg per metered squared day 1-8 with on pro-Neulasta support.  Discussed risks and benefits of chemotherapy including all but not limited to nausea, vomiting, low blood counts, risk of infections and hospitalizations.  Risk of peripheral neuropathy associated with carboplatin.  Treatment will be given with a palliative intent.  Patient understands and agrees to proceed as planned.  Plan to tentatively start chemotherapy next week.  I will also be ordering androgen receptor testing and NGS testing on her tumor specimen.  MRI brain with and without contrast to complete her staging work-up   Neoplasm related pain: Patient is already on Cymbalta and I recommended that she should try  Dilaudid 0.5 mg every 4 hours as needed as patient is hesitant to try anything stronger for her pain.  Prescription sent to her pharmacy.  Visit Diagnosis 1. Recurrent breast cancer, right ([place])   2. Goals of care, counseling/discussion      Dr. Randa Evens, MD, MPH [facility] [phone] 10/14/2020 8:34 AM

## 2020-10-14 NOTE — Telephone Encounter (Signed)
Patient called reporting that she is constipated and has tried Senna S and a Senakot laxative as well, She states that Miralax does not work for her. She states she had a small bowel movement this morning, but still needs to go and is requesting a prescription (she thinks Lactulose) which was ordered in th past. She is also requesting Magic Mouth Wash to be sent in for her mouth which is sore. Lastly, she reports that the biopsy site is still leaking after 3 weeks from biopsy date and that it saturates the dressing and gets on her clothes. She reports that she has called [facility] several times and no one has done anything about it. She reports that the doctor had called her and said someone would get in touch with her about coming in, but she has not heard back from them. Please advise

## 2020-10-14 NOTE — Telephone Encounter (Signed)
Ok to give lacutlose 30 mg BID for constipation. Magic mouth wash with lidocaine swish and spit. Can you get in touch with dr Donne Hazel and see what we can do about the biopsy wound?

## 2020-10-14 NOTE — Telephone Encounter (Signed)
Added Medicated mouthwash to list of Medications. Prescription faxed to [place]. To be pain by [employer] per [employer]

## 2020-10-14 NOTE — Telephone Encounter (Signed)
Called pt. Offered a [facility] visit in person or virtual. Pt refused either. She told me that Dr Rosana Hoes did call her on Monday from [place] and discussed the draining biopsy site. He told pt someone from [place] would call her back to schedule a visit. I reached out to Al Pimple , nurse navigator to see if she knew how to expediate or help with this issue. The pt has site covered with a bandaid. It does not ooze or drain all the time. Intermittently. Clear fluid. No blood. No pus. Pt would appreciate NP calling in the lactulose and medicated mouthwash. Pt told me once before on treatment her insurance wouldn't cover the magic mouthwash. I reached out to [place] and he told me to use [place] [facility] at [employer].

## 2020-10-15 ENCOUNTER — Inpatient Hospital Stay: Payer: PPO

## 2020-10-15 ENCOUNTER — Telehealth: Payer: Self-pay | Admitting: *Deleted

## 2020-10-15 ENCOUNTER — Inpatient Hospital Stay (HOSPITAL_BASED_OUTPATIENT_CLINIC_OR_DEPARTMENT_OTHER): Payer: PPO | Admitting: Nurse Practitioner

## 2020-10-15 ENCOUNTER — Other Ambulatory Visit: Payer: Self-pay

## 2020-10-15 VITALS — BP 122/60 | HR 90 | Temp 99.0°F | Resp 16

## 2020-10-15 VITALS — BP 101/52 | HR 91 | Temp 99.0°F | Resp 17

## 2020-10-15 DIAGNOSIS — C50511 Malignant neoplasm of lower-outer quadrant of right female breast: Secondary | ICD-10-CM

## 2020-10-15 DIAGNOSIS — R112 Nausea with vomiting, unspecified: Secondary | ICD-10-CM

## 2020-10-15 DIAGNOSIS — C50911 Malignant neoplasm of unspecified site of right female breast: Secondary | ICD-10-CM

## 2020-10-15 DIAGNOSIS — K5900 Constipation, unspecified: Secondary | ICD-10-CM

## 2020-10-15 DIAGNOSIS — Z171 Estrogen receptor negative status [ER-]: Secondary | ICD-10-CM

## 2020-10-15 DIAGNOSIS — Z5111 Encounter for antineoplastic chemotherapy: Secondary | ICD-10-CM | POA: Diagnosis not present

## 2020-10-15 LAB — CBC WITH DIFFERENTIAL/PLATELET
Abs Immature Granulocytes: 0.08 10*3/uL — ABNORMAL HIGH (ref 0.00–0.07)
Basophils Absolute: 0 10*3/uL (ref 0.0–0.1)
Basophils Relative: 0 %
Eosinophils Absolute: 0 10*3/uL (ref 0.0–0.5)
Eosinophils Relative: 0 %
HCT: 34.4 % — ABNORMAL LOW (ref 36.0–46.0)
Hemoglobin: 11.2 g/dL — ABNORMAL LOW (ref 12.0–15.0)
Immature Granulocytes: 0 %
Lymphocytes Relative: 3 %
Lymphs Abs: 0.6 10*3/uL — ABNORMAL LOW (ref 0.7–4.0)
MCH: 27.7 pg (ref 26.0–34.0)
MCHC: 32.6 g/dL (ref 30.0–36.0)
MCV: 85.1 fL (ref 80.0–100.0)
Monocytes Absolute: 0.1 10*3/uL (ref 0.1–1.0)
Monocytes Relative: 0 %
Neutro Abs: 17.8 10*3/uL — ABNORMAL HIGH (ref 1.7–7.7)
Neutrophils Relative %: 97 %
Platelets: 319 10*3/uL (ref 150–400)
RBC: 4.04 MIL/uL (ref 3.87–5.11)
RDW: 12.3 % (ref 11.5–15.5)
WBC: 18.6 10*3/uL — ABNORMAL HIGH (ref 4.0–10.5)
nRBC: 0 % (ref 0.0–0.2)

## 2020-10-15 LAB — COMPREHENSIVE METABOLIC PANEL
ALT: 15 U/L (ref 0–44)
AST: 25 U/L (ref 15–41)
Albumin: 3.3 g/dL — ABNORMAL LOW (ref 3.5–5.0)
Alkaline Phosphatase: 70 U/L (ref 38–126)
Anion gap: 12 (ref 5–15)
BUN: 21 mg/dL (ref 8–23)
CO2: 25 mmol/L (ref 22–32)
Calcium: 8.9 mg/dL (ref 8.9–10.3)
Chloride: 101 mmol/L (ref 98–111)
Creatinine, Ser: 0.9 mg/dL (ref 0.44–1.00)
GFR, Estimated: >60 mL/min (ref >60)
Glucose, Bld: 109 mg/dL — ABNORMAL HIGH (ref 70–99)
Potassium: 3.2 mmol/L — ABNORMAL LOW (ref 3.5–5.1)
Sodium: 138 mmol/L (ref 135–145)
Total Bilirubin: 0.6 mg/dL (ref 0.3–1.2)
Total Protein: 7.2 g/dL (ref 6.5–8.1)

## 2020-10-15 LAB — SURGICAL PATHOLOGY

## 2020-10-15 MED ORDER — SODIUM CHLORIDE 0.9% FLUSH
10.0000 mL | INTRAVENOUS | Status: DC | PRN
  Administered 2020-10-15: 10 mL via INTRAVENOUS
  Filled 2020-10-15: qty 10

## 2020-10-15 MED ORDER — PREDNISONE 10 MG (21) PO TBPK: ORAL_TABLET | ORAL | 0 refills | Status: DC

## 2020-10-15 MED ORDER — HEPARIN SOD (PORK) LOCK FLUSH 100 UNIT/ML IV SOLN
500.0000 [IU] | Freq: Once | INTRAVENOUS | Status: AC
  Administered 2020-10-15: 500 [IU] via INTRAVENOUS
  Filled 2020-10-15: qty 5

## 2020-10-15 MED ORDER — DEXAMETHASONE SODIUM PHOSPHATE 10 MG/ML IJ SOLN
10.0000 mg | Freq: Once | INTRAMUSCULAR | Status: AC
  Administered 2020-10-15: 10 mg via INTRAVENOUS
  Filled 2020-10-15: qty 1

## 2020-10-15 MED ORDER — PROCHLORPERAZINE EDISYLATE 10 MG/2ML IJ SOLN
10.0000 mg | Freq: Once | INTRAMUSCULAR | Status: AC
  Administered 2020-10-15: 10 mg via INTRAVENOUS
  Filled 2020-10-15: qty 2

## 2020-10-15 MED ORDER — SODIUM CHLORIDE 0.9 % IV SOLN
Freq: Once | INTRAVENOUS | Status: AC
  Filled 2020-10-15: qty 250

## 2020-10-15 MED ORDER — OLANZAPINE 10 MG PO TABS
10.0000 mg | ORAL_TABLET | Freq: Every day | ORAL | 0 refills | Status: AC
Start: 2020-10-15 — End: ?

## 2020-10-15 NOTE — Telephone Encounter (Signed)
Patient  offered 1 PM appointment with [facility] and will try to come then

## 2020-10-15 NOTE — Progress Notes (Signed)
Pt received 1 liter of NS with supportive meds in [facility] today. Tolerated ice chips and sips of ginger ale. No further vomiting or loose stools while in clinic. Pt states she is ready to go home. Accompanied by boyfriend to home. Discharged via wheelchair.

## 2020-10-15 NOTE — Progress Notes (Signed)
[facility] clinic visit made this am for ongoing constipation with N/V. Pt arrived to clinic and had huge blow out of stool. Incontinent. Bathed pt and applied hospital gown . Boyfriend went home to get a change of clothes for her. Pt had started lactulose yest when all other laxatives failed to work. Explained to patient she may have several diarrhea episodes with laxatives finally working. Pt also had episode of vomiting in cancer center. Initiated IVF's and gave supportive medications.

## 2020-10-15 NOTE — Telephone Encounter (Signed)
Patient s/o Lennette Bihari called reporting that patient is very sick, can't keep anything on her stomach and is very constipated. He doesn't know what to do for her. Please return his call [phone]

## 2020-10-16 ENCOUNTER — Encounter: Payer: Self-pay | Admitting: Nurse Practitioner

## 2020-10-19 ENCOUNTER — Inpatient Hospital Stay: Payer: PPO | Admitting: Occupational Therapy

## 2020-10-19 ENCOUNTER — Ambulatory Visit: Payer: PPO

## 2020-10-19 ENCOUNTER — Encounter: Payer: Self-pay | Admitting: Oncology

## 2020-10-19 ENCOUNTER — Other Ambulatory Visit: Payer: Self-pay

## 2020-10-19 ENCOUNTER — Inpatient Hospital Stay: Payer: PPO

## 2020-10-19 ENCOUNTER — Inpatient Hospital Stay (HOSPITAL_BASED_OUTPATIENT_CLINIC_OR_DEPARTMENT_OTHER): Payer: PPO | Admitting: Oncology

## 2020-10-19 VITALS — BP 125/64 | HR 87 | Temp 97.5°F | Wt 157.9 lb

## 2020-10-19 DIAGNOSIS — I890 Lymphedema, not elsewhere classified: Secondary | ICD-10-CM

## 2020-10-19 DIAGNOSIS — Z171 Estrogen receptor negative status [ER-]: Secondary | ICD-10-CM

## 2020-10-19 DIAGNOSIS — C50511 Malignant neoplasm of lower-outer quadrant of right female breast: Secondary | ICD-10-CM

## 2020-10-19 DIAGNOSIS — C50911 Malignant neoplasm of unspecified site of right female breast: Secondary | ICD-10-CM

## 2020-10-19 DIAGNOSIS — D649 Anemia, unspecified: Secondary | ICD-10-CM | POA: Diagnosis not present

## 2020-10-19 DIAGNOSIS — Z5111 Encounter for antineoplastic chemotherapy: Secondary | ICD-10-CM | POA: Diagnosis not present

## 2020-10-19 DIAGNOSIS — Z95828 Presence of other vascular implants and grafts: Secondary | ICD-10-CM

## 2020-10-19 LAB — COMPREHENSIVE METABOLIC PANEL
ALT: 17 U/L (ref 0–44)
AST: 17 U/L (ref 15–41)
Albumin: 3.3 g/dL — ABNORMAL LOW (ref 3.5–5.0)
Alkaline Phosphatase: 56 U/L (ref 38–126)
Anion gap: 11 (ref 5–15)
BUN: 15 mg/dL (ref 8–23)
CO2: 28 mmol/L (ref 22–32)
Calcium: 8.9 mg/dL (ref 8.9–10.3)
Chloride: 98 mmol/L (ref 98–111)
Creatinine, Ser: 0.76 mg/dL (ref 0.44–1.00)
GFR, Estimated: >60 mL/min (ref >60)
Glucose, Bld: 126 mg/dL — ABNORMAL HIGH (ref 70–99)
Potassium: 3.7 mmol/L (ref 3.5–5.1)
Sodium: 137 mmol/L (ref 135–145)
Total Bilirubin: 0.5 mg/dL (ref 0.3–1.2)
Total Protein: 7 g/dL (ref 6.5–8.1)

## 2020-10-19 LAB — CBC WITH DIFFERENTIAL/PLATELET
Abs Immature Granulocytes: 0.16 10*3/uL — ABNORMAL HIGH (ref 0.00–0.07)
Basophils Absolute: 0 10*3/uL (ref 0.0–0.1)
Basophils Relative: 0 %
Eosinophils Absolute: 0.1 10*3/uL (ref 0.0–0.5)
Eosinophils Relative: 1 %
HCT: 32.4 % — ABNORMAL LOW (ref 36.0–46.0)
Hemoglobin: 10.7 g/dL — ABNORMAL LOW (ref 12.0–15.0)
Immature Granulocytes: 2 %
Lymphocytes Relative: 16 %
Lymphs Abs: 1.6 10*3/uL (ref 0.7–4.0)
MCH: 27.9 pg (ref 26.0–34.0)
MCHC: 33 g/dL (ref 30.0–36.0)
MCV: 84.4 fL (ref 80.0–100.0)
Monocytes Absolute: 1 10*3/uL (ref 0.1–1.0)
Monocytes Relative: 10 %
Neutro Abs: 7.2 10*3/uL (ref 1.7–7.7)
Neutrophils Relative %: 71 %
Platelets: 235 10*3/uL (ref 150–400)
RBC: 3.84 MIL/uL — ABNORMAL LOW (ref 3.87–5.11)
RDW: 12.2 % (ref 11.5–15.5)
WBC: 10.1 10*3/uL (ref 4.0–10.5)
nRBC: 0 % (ref 0.0–0.2)

## 2020-10-19 MED ORDER — HEPARIN SOD (PORK) LOCK FLUSH 100 UNIT/ML IV SOLN
500.0000 [IU] | Freq: Once | INTRAVENOUS | Status: AC
  Administered 2020-10-19: 500 [IU] via INTRAVENOUS
  Filled 2020-10-19: qty 5

## 2020-10-19 MED ORDER — PALONOSETRON HCL INJECTION 0.25 MG/5ML
0.2500 mg | Freq: Once | INTRAVENOUS | Status: AC
  Administered 2020-10-19: 0.25 mg via INTRAVENOUS
  Filled 2020-10-19: qty 5

## 2020-10-19 MED ORDER — SODIUM CHLORIDE 0.9 % IV SOLN
1800.0000 mg | Freq: Once | INTRAVENOUS | Status: AC
  Administered 2020-10-19: 1800 mg via INTRAVENOUS
  Filled 2020-10-19: qty 26.3

## 2020-10-19 MED ORDER — SODIUM CHLORIDE 0.9 % IV SOLN
210.0000 mg | Freq: Once | INTRAVENOUS | Status: AC
  Administered 2020-10-19: 210 mg via INTRAVENOUS
  Filled 2020-10-19: qty 21

## 2020-10-19 MED ORDER — SODIUM CHLORIDE 0.9 % IV SOLN
10.0000 mg | Freq: Once | INTRAVENOUS | Status: AC
  Administered 2020-10-19: 10 mg via INTRAVENOUS
  Filled 2020-10-19: qty 10

## 2020-10-19 MED ORDER — HEPARIN SOD (PORK) LOCK FLUSH 100 UNIT/ML IV SOLN
500.0000 [IU] | Freq: Once | INTRAVENOUS | Status: DC | PRN
  Filled 2020-10-19: qty 5

## 2020-10-19 MED ORDER — PEGFILGRASTIM 6 MG/0.6ML ~~LOC~~ PSKT
6.0000 mg | PREFILLED_SYRINGE | Freq: Once | SUBCUTANEOUS | Status: AC
  Administered 2020-10-19: 6 mg via SUBCUTANEOUS
  Filled 2020-10-19: qty 0.6

## 2020-10-19 MED ORDER — SODIUM CHLORIDE 0.9 % IV SOLN
Freq: Once | INTRAVENOUS | Status: AC
  Filled 2020-10-19: qty 250

## 2020-10-19 MED ORDER — SODIUM CHLORIDE 0.9% FLUSH
10.0000 mL | Freq: Once | INTRAVENOUS | Status: AC
  Administered 2020-10-19: 10 mL via INTRAVENOUS
  Filled 2020-10-19: qty 10

## 2020-10-19 NOTE — Progress Notes (Signed)
Pt eating and drinking good. Taking several things for bowels and eating fruits and vegetables and drinking water. So far she has bowels and they are soft. Her arm is swollen from fluid build up and Gwenette Greet is working on night time garment for lymphedema

## 2020-10-19 NOTE — Therapy (Signed)
[facility] [address] [place], [address] [place], [place], [address] Phone: [phone]   Fax:  [phone]  Occupational Therapy Screen  Patient Details  Name: Debra Little MRN: [id number] Date of Birth: 12-19-1955 Referring Provider (OT): Dr Smith Robert   Encounter Date: 10/19/2020   OT End of Session - 10/19/20 1352    Visit Number 0           Past Medical History:  Diagnosis Date  . Anemia    d/t chemo - last Hgb 10.8  . Anxiety    sees Dr. Nolen Mu  . BRCA negative    [employer] panel neg except CHEK2 VUS  . Breast cancer (HCC) 2020  . Cyst of breast    per Dr. Lemar Livings  . Dysrhythmia    Hx of palpitations  . Family history of breast cancer   . Family history of colon cancer   . Family history of leukemia   . Family history of ovarian cancer   . Fibromyalgia   . GERD (gastroesophageal reflux disease)   . Heart murmur   . Hemorrhoids   . Herpes, genital 04/2015   confirmed with HSV 2 IgG  . Hiatal hernia   . Hypercholesterolemia    Dr. Rushie Goltz  . Hyperlipidemia   . IBS (irritable bowel syndrome)    constipation predominant  . IC (interstitial cystitis)    per [facility] [place] [facility]  . Mild depression (HCC)   . MVP (mitral valve prolapse)    [facility] cards eval 2012  . Osteopenia    2010/2017, DEXA at [facility];spine and fem neck  . PONV (postoperative nausea and vomiting)   . Vitamin D deficiency    history of    Past Surgical History:  Procedure Laterality Date  . ABDOMINAL HYSTERECTOMY    . APPENDECTOMY    . BLADDER SURGERY     1980's  . BREAST EXCISIONAL BIOPSY Right    benign  . BREAST EXCISIONAL BIOPSY Bilateral    benign  . COLONOSCOPY  09/2014   Dr. Mechele Collin  . COLONOSCOPY  2009   at [facility] 1 POLYP (BENIGN)  . excision of breast cysts     hx of multiple cyst aspirations  . EXPLORATORY LAPAROTOMY    . MASTECTOMY MODIFIED RADICAL Bilateral 10/09/2019   Procedure: RIGHT MASTECTOMY MODIFIED RADICAL AND LEFT  TOTAL MASTECTOMY;  Surgeon: Emelia Loron, MD;  Location: [facility] OR;  Service: General;  Laterality: Bilateral;  . PORTA CATH INSERTION N/A 08/06/2019   Procedure: PORTA CATH INSERTION;  Surgeon: Renford Dills, MD;  Location: [facility] INVASIVE CV LAB;  Service: Cardiovascular;  Laterality: N/A;  . PORTACATH PLACEMENT Left 08/05/2019   Procedure: INSERTION PORT-A-CATH, Attempted;  Surgeon: Leafy Ro, MD;  Location: [facility] ORS;  Service: General;  Laterality: Left;  . TUBAL LIGATION      There were no vitals filed for this visit.   Subjective Assessment - 10/19/20 1349    Subjective  I did use the pump last Wed - but not since then - I wearing my day sleeve - my arm staying about the same I think - area that is still open from biopsy is small - not draining all the time- mostly after I shower               LYMPHEDEMA/ONCOLOGY QUESTIONNAIRE - 10/19/20 0001      Right Upper Extremity Lymphedema   15 cm Proximal to Olecranon Process 31 cm    10 cm Proximal  to Olecranon Process 28.8 cm    Olecranon Process 24.3 cm    15 cm Proximal to Ulnar Styloid Process 23.3 cm    10 cm Proximal to Ulnar Styloid Process 20.4 cm    Just Proximal to Ulnar Styloid Process 15 cm    Across Hand at [employer] 17.4 cm    At [place] of 2nd Digit 5.5 cm    At [facility] of Thumb 6 cm      Left Upper Extremity Lymphedema   15 cm Proximal to Olecranon Process 28 cm    10 cm Proximal to Olecranon Process 26 cm    Olecranon Process 22.4 cm    15 cm Proximal to Ulnar Styloid Process 20.5 cm    10 cm Proximal to Ulnar Styloid Process 19 cm    Just Proximal to Ulnar Styloid Process 14.4 cm    Across Hand at [employer] 17 cm    At [place] of 2nd Digit 6 cm    At [facility] of Thumb 5.4 cm           DR RAO's   Assessment and plan of 10/19/2020- Patient is a 64 y.o. female with recurrent triple negative metaplastic right breast cancer s/p right mastectomy now with chest wall recurrence as well as metastatic liver  disease.  She is here for on treatment assessment prior to cycle 1 day 8 of carboplatin and gemcitabine  Counts okay to proceed with cycle 1 day 8 of carboplatin and gemcitabine chemotherapy today with on pro-Neulasta support.  I will see her back in 2 weeks for cycle 2-day one.  Plan is to repeat scans after 3 cycles.  Normocytic anemia: Patient has had a hemoglobin of around 10 even prior to chemotherapy and is currently stable.  Continue to monitor  Neoplasm related pain: Presently patient reports no significant pain around her chest wall and she has not taken any Dilaudid or tramadol.  I have encouraged patient to take gabapentin every night instead of as needed basis.  I have encouraged the patient to get vaccinated against Covid   Visit Diagnosis 1. Malignant neoplasm of lower-outer quadrant of right breast of female, estrogen receptor negative ([place])   2. Normocytic anemia   3. Encounter for antineoplastic chemotherapy      Pt seen for OT screen this date and circumference of R UE compare to about month ago  R UE circumference increased from forearm to upper arm since Oct measurements Upper arm increase 1.8 to 2 cm , elbow increase 1 cm , and forearm increase 1.4 cm   Pt R UE now increase compare to L UE - 3 cm at upper arm, elbow increase 1.9 cm , forearm increase 2.8 cm   wrist 0.6 cm   pt to cont wearing [employer] soft sleeve and glove during day And she cannot do the lymphedema pump anymore -  Contacted the Rep for night time compression garment - if insurance will cover - [employer] night time will be order  Will check if [employer] fund can assist if insurance do not cover    Met pt again in chemo - to tell her - Reps out of town, not in office -and will get back with her as soon as they in office or after Thanksgiving  Visit Diagnosis: Recurrent breast cancer, right ([place])  Lymphedema of right upper  extremity    Problem List Patient Active Problem List   Diagnosis Date Noted  . Ductal carcinoma in situ (DCIS) of left breast 03/04/2020  . Bilateral breast cancer ([place]) 10/09/2019  . Genetic testing 08/27/2019  . Family history of breast cancer   . Family history of ovarian cancer   . Family history of colon cancer   . Family history of leukemia   . Goals of care, counseling/discussion 08/07/2019  . Breast cancer ([place]) 07/30/2019  . GERD (gastroesophageal reflux disease) 06/30/2019  . Abdominal discomfort 05/15/2019  . Advance care planning 07/14/2018  . Health care maintenance 07/14/2018  . Depression, recurrent ([place]) 07/14/2018  . Constipation 07/14/2018  . Altered taste 07/14/2018  . Fibrocystic breast changes of both breasts 01/24/2018  . Vasomotor symptoms due to menopause 09/05/2017  . Herpes simplex vulvovaginitis 09/05/2017  . Skin nodule 05/25/2017  . Encounter for screening examination for infectious disease 03/18/2017  . Lipoma 03/18/2017  . Vaginitis 03/18/2017  . Radicular pain in left arm 07/27/2015  . Neuralgia and neuritis, unspecified 07/27/2015  . Fatigue 03/05/2015  . B12 deficiency 03/05/2015  . Skin rash 05/01/2014  . MVP (mitral valve prolapse) 04/30/2014  . Cyst of breast 04/05/2014  . Interstitial cystitis 03/31/2014  . Routine general medical examination at a health care facility 11/13/2011  . Anxiety and depression 10/06/2010  . BREAST CYSTS, BILATERAL 12/03/2008  . PALPITATIONS, CHRONIC 12/03/2008  . Vitamin D deficiency 09/10/2008  . HLD (hyperlipidemia) 09/10/2008  . IRRITABLE BOWEL SYNDROME 02/12/2008    Rosalyn Gess  OTR/L,CLT 10/19/2020, 1:55 PM  [facility] [address] [place], [place], [place], [address] Phone: [phone]   Fax:  [phone]  Name: Debra Little MRN: [id number] Date of Birth: May 12, 1956

## 2020-10-19 NOTE — Progress Notes (Signed)
Pt tolerated treatment well. [place] on pro reviewed and all questions were answered. Pt stable at time of discharge.

## 2020-10-19 NOTE — Progress Notes (Signed)
Hematology/Oncology Consult note [facility]  [phone] [phone]  Patient Care Team: Tonia Ghent, MD as PCP - General (Family Medicine) Bary Castilla, Forest Gleason, MD (General Surgery) Modesto Charon, MD (Family Medicine) Laneta Simmers as Physician Assistant (Urology) [name] Javier Docker, MD as Consulting Physician (Cardiology) Sindy Guadeloupe, MD as Consulting Physician (Hematology and Oncology) Noreene Filbert, MD as Referring Physician (Radiation Oncology)   Name of the patient: Debra Little  [id number]  [birth year]   Date of visit: 10/19/20  Diagnosis-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 in the past and has undergone breast biopsies which did not previously showed malignancy. More recently patient noted some red discoloration around her right perioral as well as a palpable mass which led to a diagnostic mammogram on the right side her prior mammogram in February 2020 was normal in August 2020 she was noted to have a 2.7 x 2 x 2.3 cm mass in her right breast along with 4 morphologically abnormal lymph nodes. She underwent breast biopsy as well as lymph node biopsy which showed grade 3 invasive mammary carcinoma. ER PR and HER-2/neunegative.Sections show high-grade invasive carcinoma with focal squamous differentiation and pinpoint keratin ideation. The features are concerning for metaplastic differentiation.[name] Lymphovascular invasion present. There was extranodal extension present on the lymph node biopsy.   Her family history is significant for breast cancer in her mother in her 61s. Father had colon cancer in his70s.Family history also significant for ovarian cancer in her maternal  aunt.  MRI of bilateral breasts showed: Primary right breast mass was 3.5 x 2.7 x 3.4 cm. There were surrounding numerous nodules consistent with satellite lesions. Extensive nodular non-mass enhancement throughout the right breast involving the upper and lower quadrants spanning 6.2 x 4.5 x 5.5 cm. Markedly enhancement of the left breast diffusely. Non-mass enhancement measures 1.9 x 2.4 cm. At least 3 abnormal lymph nodes in the right axilla. No abnormal left axillary lymph nodes. 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 Equivocal inferior breast and left axillary nodal hypermetabolism. No evidence of extrathoracic hypermetabolic metastases. Lateral right breast primary with right axillary nodal metastases  Patient had 3 cycles of neoadjuvant AC chemotherapy along with Keytruda. There was no significant response in her tumor and patient proceeded with bilateral mastectomy with targeted node dissection. 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 7 lymph nodes were positive for malignancy with extranodal extension overall cancer cellularity was 40% [id number].Patient completed 1 cycle of adjuvant AC chemotherapy and willcomplete weekly Taxol chemotherapy in April 2021. She completed post mastectomy radiation  PET scan in November 2021 was done for ongoing chest wall pain.  It showed:1. Aggressive appearing and extensive recurrent right breast cancer with right chest wall involvement as detailed above. 2. No findings for mediastinal/hilar metastatic lymphadenopathy or metastatic pulmonary disease. 3. Single left hepatic lobe liver lesion consistent with metastatic focus. 4. Hypermetabolic soft tissue mass in the left oropharynx/tonsillar region suspicious for primary neoplasm.   Interval history-patient reports that her nausea and  vomiting resolved after constipation got better.  She is currently using MiraLAX and as well as Trulance and as needed lactulose and is having  regular bowel movements.  She reports no significant chest wall pain and has not used tramadol or Dilaudid.  She takes gabapentin on an as-needed basis.  Reports having itching along the lateral aspect of the chest wall.  Biopsy site wound is slowly healing and has occasional discharge.  ECOG PS- 1 Pain scale- 3 Opioid associated constipation- no  Review of systems- Review of Systems  Constitutional: Positive for malaise/fatigue. Negative for chills, fever and weight loss.  HENT: Negative for congestion, ear discharge and nosebleeds.   Eyes: Negative for blurred vision.  Respiratory: Negative for cough, hemoptysis, sputum production, shortness of breath and wheezing.   Cardiovascular: Negative for chest pain, palpitations, orthopnea and claudication.  Gastrointestinal: Negative for abdominal pain, blood in stool, constipation, diarrhea, heartburn, melena, nausea and vomiting.  Genitourinary: Negative for dysuria, flank pain, frequency, hematuria and urgency.  Musculoskeletal: Negative for back pain, joint pain and myalgias.       Right chest wall pain  Skin: Negative for rash.  Neurological: Negative for dizziness, tingling, focal weakness, seizures, weakness and headaches.  Endo/Heme/Allergies: Does not bruise/bleed easily.  Psychiatric/Behavioral: Negative for depression and suicidal ideas. The patient does not have insomnia.        Allergies  Allergen Reactions  . Amitiza [Lubiprostone] Other (See Comments)    Overly effective at higher dose  . Bentyl [Dicyclomine Hcl]     Lack of effect  . Cortisone Other (See Comments)    flush  . Mobic [Meloxicam]     Palpitations.  But can tolerate aleve  . Nitrofurantoin Monohyd Macro   . Sulfonamide Derivatives Nausea Only  . Zoloft [Sertraline Hcl] Diarrhea and Nausea Only  . Buspar [Buspirone]  Palpitations  . Celecoxib Rash    REACTION: unspecified  . Doxycycline Palpitations    chest pain  . Penicillins Rash     Past Medical History:  Diagnosis Date  . Anemia    d/t chemo - last Hgb 10.8  . Anxiety    sees Dr. [name]  . BRCA negative    Inv[facility]ae panel neg except CHEK2 VUS  . Breast cancer ([place]) 2020  . Cyst of breast    per [name]  . Dysrhythmia    Hx of palpitations  . Family history of breast cancer   . Family history of colon cancer   . Family history of leukemia   . Family history of ovarian cancer   . Fibromyalgia   . GERD (gastroesophageal reflux disease)   . Heart murmur   . Hemorrhoids   . Herpes, genital 04/2015   confirmed with HSV 2 IgG  . Hiatal hernia   . Hypercholesterolemia    Dr. Kyra Searles  . Hyperlipidemia   . IBS (irritable bowel syndrome)    constipation predominant  . IC (interstitial cystitis)    per [facility] [facility]  . Mild depression ([place])   . MVP (mitral valve prolapse)    [facility] cards eval 2012  . Osteopenia    2010/2017, DEXA at [facility];spine and fem neck  . PONV (postoperative nausea and vomiting)   . Vitamin D deficiency    history of     Past Surgical History:  Procedure Laterality Date  . ABDOMINAL HYSTERECTOMY    . APPENDECTOMY    . BLADDER SURGERY     1980's  . BREAST EXCISIONAL BIOPSY Right    benign  . BREAST EXCISIONAL BIOPSY Bilateral    benign  . COLONOSCOPY  09/2014   Dr. [name]  . COLONOSCOPY  01/21/2008   at [facility] 1 POLYP (BENIGN)  . excision of breast cysts     hx of multiple cyst aspirations  . EXPLORATORY LAPAROTOMY    . MASTECTOMY MODIFIED RADICAL Bilateral 10/09/2019   Procedure: RIGHT MASTECTOMY MODIFIED RADICAL AND LEFT TOTAL MASTECTOMY;  Surgeon: Rolm Bookbinder, MD;  Location: [place];  Service: General;  Laterality: Bilateral;  . PORTA CATH INSERTION N/A 08/06/2019   Procedure: PORTA CATH INSERTION;  Surgeon: Katha Cabal, MD;  Location: [place] [facility];  Service: Cardiovascular;   Laterality: N/A;  . PORTACATH PLACEMENT Left 08/05/2019   Procedure: INSERTION PORT-A-CATH, Attempted;  Surgeon: Jules Husbands, MD;  Location: [facility];  Service: General;  Laterality: Left;  . TUBAL LIGATION      Social History   Socioeconomic History  . Marital status: Divorced    Spouse name: Not on file  . Number of children: 1  . Years of education: Not on file  . Highest education level: Not on file  Occupational History  . Occupation: [employer]  Tobacco Use  . Smoking status: Never Smoker  . Smokeless tobacco: Never Used  Vaping Use  . Vaping Use: Never used  Substance and Sexual Activity  . Alcohol use: No    Alcohol/week: 0.0 standard drinks  . Drug use: No  . Sexual activity: Yes    Birth control/protection: Surgical    Comment: Hysterectomy  Other Topics Concern  . Not on file  Social History Narrative   Married in 01/20/1997, divorced as of 2017/01/20, 1 son from previous relationship   Brother with MVA at 29, in rest home for many years as of 01-20-18- died of covid recently   Social Determinants of Radio broadcast assistant Strain: Lolo   . Difficulty of Paying Living Expenses: Not hard at all  Food Insecurity: No Food Insecurity  . Worried About Charity fundraiser in the Last Year: Never true  . Ran Out of Food in the Last Year: Never true  Transportation Needs: No Transportation Needs  . Lack of Transportation (Medical): No  . Lack of Transportation (Non-Medical): No  Physical Activity: Inactive  . Days of Exercise per Week: 0 days  . Minutes of Exercise per Session: 0 min  Stress: No Stress Concern Present  . Feeling of Stress : Not at all  Social Connections:   . Frequency of Communication with Friends and Family: Not on file  . Frequency of Social Gatherings with Friends and Family: Not on file  . Attends Religious Services: Not on file  . Active Member of Clubs or Organizations: Not on file  . Attends Archivist Meetings: Not on file  . Marital  Status: Not on file  Intimate Partner Violence: Not At Risk  . Fear of Current or Ex-Partner: No  . Emotionally Abused: No  . Physically Abused: No  . Sexually Abused: No    Family History  Problem Relation Age of Onset  . Breast cancer Mother 18  . Hypertension Mother   . Colon cancer Father 76  . Hyperlipidemia Father   . Ovarian cancer Maternal Aunt 80  . Breast cancer Cousin   . Gallbladder disease Paternal Grandmother   . Liver cancer Cousin 70       Malignant  . Cancer Maternal Uncle        unsure type     Current Outpatient Medications:  .  acetaminophen (TYLENOL) 650 MG CR tablet, Take 650 mg by mouth every  8 (eight) hours as needed for pain., Disp: , Rfl:  .  Ascorbic Acid (VITAMIN C) 100 MG tablet, Take 100 mg by mouth daily., Disp: , Rfl:  .  atorvastatin (LIPITOR) 40 MG tablet, Take 40 mg by mouth at bedtime. , Disp: , Rfl: 1 .  Calcium Carbonate-Vit D-Min (CALCIUM 1200 PO), Take 4 tablets by mouth daily., Disp: , Rfl:  .  cholecalciferol (VITAMIN D3) 25 MCG (1000 UNIT) tablet, Take 1 tablet (1,000 Units total) by mouth daily., Disp: , Rfl:  .  clonazePAM (KLONOPIN) 0.5 MG tablet, Take 0.5 mg by mouth 2 (two) times daily. , Disp: , Rfl:  .  dexamethasone (DECADRON) 4 MG tablet, Take 2 tablets (8 mg total) by mouth daily. Start the day after chemotherapy for 2 days. Take with food., Disp: 30 tablet, Rfl: 1 .  Dexlansoprazole 30 MG capsule, Take 1 capsule (30 mg total) by mouth daily., Disp: 30 capsule, Rfl: 6 .  DULoxetine (CYMBALTA) 60 MG capsule, Take 60 mg by mouth daily. , Disp: , Rfl:  .  furosemide (LASIX) 20 MG tablet, Take 1 tablet (20 mg total) by mouth every other day., Disp: , Rfl:  .  gabapentin (NEURONTIN) 100 MG capsule, Take 1-2 capsules (100-200 mg total) by mouth 3 (three) times daily as needed., Disp: 90 capsule, Rfl: 1 .  lidocaine-prilocaine (EMLA) cream, Apply to affected area once, Disp: 30 g, Rfl: 3 .  loratadine (CLARITIN) 10 MG tablet, Take 1  tablet (10 mg total) by mouth daily., Disp: , Rfl:  .  NON FORMULARY, Take 5 mLs by mouth 4 (four) times daily as needed. Swish and swallow 5-10 ml of Medicated Mouthwash 4 x per day as needed, Disp: , Rfl:  .  ondansetron (ZOFRAN) 8 MG tablet, Take 1 tablet (8 mg total) by mouth 2 (two) times daily as needed for refractory nausea / vomiting. Start on day 3 after chemotherapy., Disp: 30 tablet, Rfl: 1 .  Plecanatide (TRULANCE) 3 MG TABS, Take 3 mg by mouth daily., Disp: 30 tablet, Rfl: 0 .  predniSONE (STERAPRED UNI-PAK 21 TAB) 10 MG (21) TBPK tablet, Day 1: 2 tab before breakfast, 1 after lunch, 1 after dinner, & 2 at bedtime; Day 2: 1 tab before breakfast, 1 after lunch, 1 after dinner, & 2 at bedtime; Day 3: 1 tab before breakfast, 1 after lunch, 1 after dinner, & 1 at bedtime; Day 4: 1 tab before breakfast, 1 after lunch, & 1 at bedtime; Day 5: 1 tab before breakfast & 1 at bedtime; Day 6: 1 tablet before breakfast, Disp: 21 tablet, Rfl: 0 .  sennosides-docusate sodium (SENOKOT-S) 8.6-50 MG tablet, Take 1-2 tablets by mouth at bedtime. , Disp: , Rfl:  .  valACYclovir (VALTREX) 500 MG tablet, Take 1 tablet (500 mg total) by mouth daily., Disp: 90 tablet, Rfl: 3 .  vitamin B-12 (CYANOCOBALAMIN) 500 MCG tablet, Take 500 mcg by mouth daily., Disp: , Rfl:  .  HYDROmorphone HCl (DILAUDID) 1 MG/ML LIQD, Take 0.5 mLs (0.5 mg total) by mouth every 4 (four) hours as needed for severe pain. (Patient not taking: Reported on 10/12/2020), Disp: 90 mL, Rfl: 0 .  lactulose (CHRONULAC) 10 GM/15ML solution, Take 45 mLs (30 g total) by mouth 2 (two) times daily. (Patient not taking: Reported on 10/19/2020), Disp: 236 mL, Rfl: 1 .  OLANZapine (ZYPREXA) 10 MG tablet, Take 1 tablet (10 mg total) by mouth at bedtime. For nausea or vomiting (Patient not taking: Reported on 10/19/2020), Disp:  30 tablet, Rfl: 0 .  prochlorperazine (COMPAZINE) 10 MG tablet, Take 1 tablet (10 mg total) by mouth every 6 (six) hours as needed  (Nausea or vomiting). (Patient not taking: Reported on 10/19/2020), Disp: 30 tablet, Rfl: 1 .  sucralfate (CARAFATE) 1 g tablet, Take 1 g by mouth 3 (three) times daily.  (Patient not taking: Reported on 09/24/2020), Disp: , Rfl:  No current facility-administered medications for this visit.  Facility-Administered Medications Ordered in Other Visits:  .  CARBOplatin (PARAPLATIN) 210 mg in sodium chloride 0.9 % 250 mL chemo infusion, 210 mg, Intravenous, Once, Sindy Guadeloupe, MD, Last Rate: 542 mL/hr at 10/19/20 1255, 210 mg at 10/19/20 1255 .  heparin lock flush 100 unit/mL, 500 Units, Intravenous, Once, Sindy Guadeloupe, MD .  heparin lock flush 100 unit/mL, 500 Units, Intracatheter, Once PRN, Sindy Guadeloupe, MD .  pegfilgrastim (NEULASTA ONPRO KIT) injection 6 mg, 6 mg, Subcutaneous, Once, Sindy Guadeloupe, MD  Physical exam:  Vitals:   10/19/20 1040  BP: 125/64  Pulse: 87  Temp: (!) 97.5 F (36.4 C)  TempSrc: Tympanic  SpO2: 100%  Weight: 157 lb 14.4 oz (71.6 kg)   Physical Exam Constitutional:      General: She is not in acute distress. Cardiovascular:     Rate and Rhythm: Normal rate and regular rhythm.     Heart sounds: Normal heart sounds.  Pulmonary:     Effort: Pulmonary effort is normal.     Breath sounds: Normal breath sounds.  Abdominal:     General: Bowel sounds are normal.     Palpations: Abdomen is soft.  Musculoskeletal:     Comments: There is a Band-Aid over the site of the right chest wall biopsy which appears clean and no drainage  Skin:    General: Skin is warm and dry.  Neurological:     Mental Status: She is alert and oriented to person, place, and time.      CMP Latest Ref Rng & Units 10/19/2020  Glucose 70 - 99 mg/dL 126(H)  BUN 8 - 23 mg/dL 15  Creatinine 0.44 - 1.00 mg/dL 0.76  Sodium 135 - 145 mmol/L 137  Potassium 3.5 - 5.1 mmol/L 3.7  Chloride 98 - 111 mmol/L 98  CO2 22 - 32 mmol/L 28  Calcium 8.9 - 10.3 mg/dL 8.9  Total Protein 6.5 - 8.1  g/dL 7.0  Total Bilirubin 0.3 - 1.2 mg/dL 0.5  Alkaline Phos 38 - 126 U/L 56  AST 15 - 41 U/L 17  ALT 0 - 44 U/L 17   CBC Latest Ref Rng & Units 10/19/2020  WBC 4.0 - 10.5 K/uL 10.1  Hemoglobin 12.0 - 15.0 g/dL 10.7(L)  Hematocrit 36 - 46 % 32.4(L)  Platelets 150 - 400 K/uL 235    No images are attached to the encounter.  NM PET Image Restag (PS) Skull Base To Thigh  Result Date: 10/08/2020 CLINICAL DATA:  Subsequent treatment strategy for recurrent right breast cancer. EXAM: NUCLEAR MEDICINE PET SKULL BASE TO THIGH TECHNIQUE: 8.79 mCi F-18 FDG was injected intravenously. Full-ring PET imaging was performed from the skull base to thigh after the radiotracer. CT data was obtained and used for attenuation correction and anatomic localization. Fasting blood glucose: 81 mg/dl COMPARISON:  PET-CT 08/06/2019 FINDINGS: Mediastinal blood pool activity: SUV max 2.41 Liver activity: SUV max NA NECK: There is an ill-defined soft tissue mass in the left oropharynx/tonsillar region measuring approximately 16 x 15 mm. It is hypermetabolic with  SUV max of 7.62. This would be an unusual place for metastatic disease. It could be a primary mucosal tonsillar lesion. Recommend direct visualization and biopsy if indicated. No neck adenopathy. Incidental CT findings: none CHEST: Right-sided subclavicular soft tissue mass measuring approximately 14 mm. SUV max is 15.52. There is also a second soft tissue mass in the right subpectoral region measuring 24 mm with an SUV max of 10.53. Findings consistent with metastatic disease. Right-sided anterior chest wall mass measures 24 mm on image number 78/4 is hypermetabolic with SUV max of 69.62. 2.7 cm mass in the right anterior chest wall in the region the internal mammary vessels and lymph nodes. There is associated destructive bony changes involving the right aspect of the sternum. I suspect this is most likely an aggressive nodal metastasis invading/destroying the sternum.  SUV max is 12.07. In the lower right anterolateral chest wall/lower right breast area there is an ill-defined 22 mm soft tissue mass with hypermetabolism and SUV max of 12.72. No enlarged or hypermetabolic mediastinal or hilar lymph nodes and no worrisome hypermetabolic pulmonary lesions to suggest metastatic disease. Incidental CT findings: Stable radiation changes involving the anterior aspect of the right lung. ABDOMEN/PELVIS: Single low-attenuation lesion in the left hepatic lobe anteriorly in segment 3. This measures approximately 13 mm and is hypermetabolic with SUV max of 9.52. Findings consistent with a metastatic focus. No findings for metastatic disease involving the adrenal glands. No enlarged or hypermetabolic abdominal/pelvic lymph nodes. Incidental CT findings: none SKELETON: No focal hypermetabolic activity to suggest skeletal metastasis. Incidental CT findings: none IMPRESSION: 1. Aggressive appearing and extensive recurrent right breast cancer with right chest wall involvement as detailed above. 2. No findings for mediastinal/hilar metastatic lymphadenopathy or metastatic pulmonary disease. 3. Single left hepatic lobe liver lesion consistent with metastatic focus. 4. Hypermetabolic soft tissue mass in the left oropharynx/tonsillar region suspicious for primary neoplasm. Recommend ENT consultation and direct visualization and biopsy as indicated. 5. No findings suspicious for osseous metastatic disease. Electronically Signed   By: Marijo Sanes M.D.   On: 10/08/2020 09:33   US Breast Limited Uni Right Inc Axilla  Result Date: 09/27/2020 CLINICAL DATA:  63 year old female who had bilateral mastectomies for breast cancer in November of 2020. Patient complains of a palpable abnormality at the lateral margin of the mastectomy scar in the lower axillary region. EXAM: ULTRASOUND OF THE RIGHT BREAST COMPARISON:  Previous exam(s). FINDINGS: On physical exam, I palpate a discrete mass in the lower right  axilla at the lateral margin of the mastectomy scar. Targeted ultrasound is performed, showing a vascular hypoechoic mass in the lower right axilla measuring 1.6 x 1.1 x 1.1 cm. No additional masses are seen in the axillary region. IMPRESSION: Suspicious mass in the lower right axilla at the margin of the mastectomy scar. RECOMMENDATION: Ultrasound-guided core biopsy of the right breast/axillary mass is recommended. I have discussed the findings and recommendations with the patient. If applicable, a reminder letter will be sent to the patient regarding the next appointment. BI-RADS CATEGORY  4: Suspicious. Electronically Signed   By: Lillia Mountain M.D.   On: 09/27/2020 14:08   [place] RT BREAST BX W LOC DEV 1ST LESION IMG BX SPEC US GUIDE  Addendum Date: 10/04/2020   ADDENDUM REPORT: 10/01/2020 12:55 ADDENDUM: PATHOLOGY revealed: A. BREAST, RIGHT LATERAL AND BELOW AXILLA; ULTRASOUND-GUIDED BIOPSY: - HIGH-GRADE MAMMARY CARCINOMA MORPHOLOGICALLY CONSISTENT WITH PATIENT'S PRIOR RIGHT BREAST CARCINOMA. - COMMENT: Sections demonstrate cores of fibroadipose tissue and skeletal muscle involved by high-grade  mammary carcinoma that is morphologically consistent with the patient's prior right breast carcinoma. Background mammary epithelium and lymph node tissue are not identified. Slides on the patient's prior right breast biopsy ([id number]) were reviewed in conjunction with this case. Pathology results are CONCORDANT with imaging findings, per Dr. Lajean Manes. Pathology results and recommendations below were discussed with patient by her oncology provider Leafy Kindle, NP) on 10/01/2020. Patient reported biopsy site within normal limits. Post biopsy care instructions were reviewed and questions were answered. Recommend surgical consultation and continue oncological support. Patient currently under care of oncologist (Dr. Randa Evens) and surgeon (Dr. Rolm Bookbinder); both physicians are aware of biopsy results and on  10/01/2020 they contacted patient regarding clinical follow up appointments/plan. Pathology results reported by Electa Sniff RN on 10/01/2020. Electronically Signed   By: Lajean Manes M.D.   On: 10/01/2020 12:55   Result Date: 10/04/2020 CLINICAL DATA:  Patient presents for ultrasound-guided core needle biopsy of the mass along the lateral aspect of the patient's right mastectomy scar, just below her right axilla. Patient has a history of bilateral mastectomies for breast carcinoma, diagnosed in 2020. EXAM: ULTRASOUND GUIDED RIGHT BREAST CORE NEEDLE BIOPSY COMPARISON:  Previous exam(s). PROCEDURE: I met with the patient and we discussed the procedure of ultrasound-guided biopsy, including benefits and alternatives. We discussed the high likelihood of a successful procedure. We discussed the risks of the procedure, including infection, bleeding, tissue injury, clip migration, and inadequate sampling. Informed written consent was given. The usual time-out protocol was performed immediately prior to the procedure. Lesion quadrant: Upper outer quadrant: Along the lateral mastectomy scar just inferior to the right axilla. Using sterile technique and 1% Lidocaine as local anesthetic, under direct ultrasound visualization, a 14 gauge spring-loaded device was used to perform biopsy of palpable mass using an inferior approach. At the conclusion of the procedure [facility] tissue marker clip was deployed into the biopsy cavity. The post biopsy marker clip was well seen within the mass sonographically. No postprocedure mammogram was performed. IMPRESSION: Ultrasound guided biopsy of a mass along the lateral aspect of the patient's right mastectomy scar just below the right axilla. No apparent complications. Electronically Signed: By: Lajean Manes M.D. On: 09/30/2020 09:10     Assessment and plan- Patient is a 64 y.o. female with recurrent triple negative metaplastic right breast cancer s/p right mastectomy now with chest  wall recurrence as well as metastatic liver disease.  She is here for on treatment assessment prior to cycle 1 day 8 of carboplatin and gemcitabine  Counts okay to proceed with cycle 1 day 8 of carboplatin and gemcitabine chemotherapy today with on pro-Neulasta support.  I will see her back in 2 weeks for cycle 2-day one.  Plan is to repeat scans after 3 cycles.  Normocytic anemia: Patient has had a hemoglobin of around 10 even prior to chemotherapy and is currently stable.  Continue to monitor  Neoplasm related pain: Presently patient reports no significant pain around her chest wall and she has not taken any Dilaudid or tramadol.  I have encouraged patient to take gabapentin every night instead of as needed basis.  I have encouraged the patient to get vaccinated against Covid   Visit Diagnosis 1. Malignant neoplasm of lower-outer quadrant of right breast of female, estrogen receptor negative ([place])   2. Normocytic anemia   3. Encounter for antineoplastic chemotherapy      Dr. Randa Evens, MD, MPH [facility] at [facility] [phone] 10/19/2020 1:03 PM

## 2020-10-25 ENCOUNTER — Encounter: Payer: Self-pay | Admitting: Nurse Practitioner

## 2020-10-25 ENCOUNTER — Telehealth: Payer: Self-pay | Admitting: Nurse Practitioner

## 2020-10-25 ENCOUNTER — Ambulatory Visit
Admission: RE | Admit: 2020-10-25 | Discharge: 2020-10-25 | Disposition: A | Discharge status: Home / Self Care | Payer: PPO | Source: Ambulatory Visit | Attending: Oncology | Admitting: Oncology

## 2020-10-25 ENCOUNTER — Other Ambulatory Visit: Payer: Self-pay

## 2020-10-25 DIAGNOSIS — C50911 Malignant neoplasm of unspecified site of right female breast: Secondary | ICD-10-CM | POA: Insufficient documentation

## 2020-10-25 DIAGNOSIS — C50919 Malignant neoplasm of unspecified site of unspecified female breast: Secondary | ICD-10-CM | POA: Diagnosis not present

## 2020-10-25 MED ORDER — GADOBUTROL 1 MMOL/ML IV SOLN
6.0000 mL | Freq: Once | INTRAVENOUS | Status: AC | PRN
  Administered 2020-10-25: 6 mL via INTRAVENOUS

## 2020-10-25 NOTE — Telephone Encounter (Signed)
Spoke to patient to follow-up on her MyChart message.  She reports intermittent pain at site of known hiatal hernia around her chemotherapy.  She takes Dexilant chronically but continues to have symptoms.  She asks for additional options for when pain occurs.  Discussed with Dr. Janese Banks who will reach out to GI for specific recommendations.  In the interim, recommend Pepcid when pain occurs.  If refractory, consider scheduled Pepcid vs increasing dose of Dexilant.

## 2020-10-28 ENCOUNTER — Telehealth: Payer: Self-pay

## 2020-10-28 NOTE — Telephone Encounter (Signed)
Spoke with patient regarding CARE program, she is still thinking about coming back for 1 day/week. Let patient know to call us when and if she is ready. Will keep her spot open for now.

## 2020-11-01 ENCOUNTER — Encounter: Payer: Self-pay | Admitting: Oncology

## 2020-11-02 ENCOUNTER — Inpatient Hospital Stay: Payer: PPO

## 2020-11-02 ENCOUNTER — Inpatient Hospital Stay (HOSPITAL_BASED_OUTPATIENT_CLINIC_OR_DEPARTMENT_OTHER): Payer: PPO | Admitting: Oncology

## 2020-11-02 ENCOUNTER — Encounter: Payer: Self-pay | Admitting: Oncology

## 2020-11-02 ENCOUNTER — Inpatient Hospital Stay: Payer: PPO | Attending: Oncology

## 2020-11-02 ENCOUNTER — Telehealth: Payer: Self-pay | Admitting: *Deleted

## 2020-11-02 VITALS — BP 106/63 | HR 84 | Temp 98.4°F | Resp 16 | Ht 67.0 in | Wt 158.9 lb

## 2020-11-02 DIAGNOSIS — Z806 Family history of leukemia: Secondary | ICD-10-CM | POA: Insufficient documentation

## 2020-11-02 DIAGNOSIS — K219 Gastro-esophageal reflux disease without esophagitis: Secondary | ICD-10-CM | POA: Diagnosis not present

## 2020-11-02 DIAGNOSIS — Z8041 Family history of malignant neoplasm of ovary: Secondary | ICD-10-CM | POA: Diagnosis not present

## 2020-11-02 DIAGNOSIS — C50511 Malignant neoplasm of lower-outer quadrant of right female breast: Secondary | ICD-10-CM | POA: Diagnosis not present

## 2020-11-02 DIAGNOSIS — Z800 Family history of malignant neoplasm of digestive organs: Secondary | ICD-10-CM | POA: Diagnosis not present

## 2020-11-02 DIAGNOSIS — D6481 Anemia due to antineoplastic chemotherapy: Secondary | ICD-10-CM | POA: Insufficient documentation

## 2020-11-02 DIAGNOSIS — Z171 Estrogen receptor negative status [ER-]: Secondary | ICD-10-CM

## 2020-11-02 DIAGNOSIS — C50919 Malignant neoplasm of unspecified site of unspecified female breast: Secondary | ICD-10-CM

## 2020-11-02 DIAGNOSIS — Z5111 Encounter for antineoplastic chemotherapy: Secondary | ICD-10-CM | POA: Insufficient documentation

## 2020-11-02 DIAGNOSIS — R5383 Other fatigue: Secondary | ICD-10-CM | POA: Insufficient documentation

## 2020-11-02 DIAGNOSIS — Z5189 Encounter for other specified aftercare: Secondary | ICD-10-CM | POA: Insufficient documentation

## 2020-11-02 DIAGNOSIS — Z79899 Other long term (current) drug therapy: Secondary | ICD-10-CM | POA: Insufficient documentation

## 2020-11-02 DIAGNOSIS — E7800 Pure hypercholesterolemia, unspecified: Secondary | ICD-10-CM | POA: Insufficient documentation

## 2020-11-02 DIAGNOSIS — C787 Secondary malignant neoplasm of liver and intrahepatic bile duct: Secondary | ICD-10-CM | POA: Diagnosis not present

## 2020-11-02 DIAGNOSIS — R5381 Other malaise: Secondary | ICD-10-CM | POA: Diagnosis not present

## 2020-11-02 DIAGNOSIS — E785 Hyperlipidemia, unspecified: Secondary | ICD-10-CM | POA: Insufficient documentation

## 2020-11-02 DIAGNOSIS — Z9189 Other specified personal risk factors, not elsewhere classified: Secondary | ICD-10-CM | POA: Diagnosis not present

## 2020-11-02 DIAGNOSIS — Z803 Family history of malignant neoplasm of breast: Secondary | ICD-10-CM | POA: Diagnosis not present

## 2020-11-02 DIAGNOSIS — M79601 Pain in right arm: Secondary | ICD-10-CM | POA: Insufficient documentation

## 2020-11-02 DIAGNOSIS — G893 Neoplasm related pain (acute) (chronic): Secondary | ICD-10-CM | POA: Diagnosis not present

## 2020-11-02 DIAGNOSIS — Z9013 Acquired absence of bilateral breasts and nipples: Secondary | ICD-10-CM | POA: Diagnosis not present

## 2020-11-02 DIAGNOSIS — T451X5A Adverse effect of antineoplastic and immunosuppressive drugs, initial encounter: Secondary | ICD-10-CM | POA: Insufficient documentation

## 2020-11-02 LAB — CBC WITH DIFFERENTIAL/PLATELET
Abs Immature Granulocytes: 2.34 10*3/uL — ABNORMAL HIGH (ref 0.00–0.07)
Basophils Absolute: 0.1 10*3/uL (ref 0.0–0.1)
Basophils Relative: 0 %
Eosinophils Absolute: 0.1 10*3/uL (ref 0.0–0.5)
Eosinophils Relative: 1 %
HCT: 34.2 % — ABNORMAL LOW (ref 36.0–46.0)
Hemoglobin: 11.1 g/dL — ABNORMAL LOW (ref 12.0–15.0)
Immature Granulocytes: 11 %
Lymphocytes Relative: 7 %
Lymphs Abs: 1.5 10*3/uL (ref 0.7–4.0)
MCH: 28.7 pg (ref 26.0–34.0)
MCHC: 32.5 g/dL (ref 30.0–36.0)
MCV: 88.4 fL (ref 80.0–100.0)
Monocytes Absolute: 2 10*3/uL — ABNORMAL HIGH (ref 0.1–1.0)
Monocytes Relative: 9 %
Neutro Abs: 15.6 10*3/uL — ABNORMAL HIGH (ref 1.7–7.7)
Neutrophils Relative %: 72 %
Platelets: 368 10*3/uL (ref 150–400)
RBC: 3.87 MIL/uL (ref 3.87–5.11)
RDW: 15.5 % (ref 11.5–15.5)
Smear Review: NORMAL
WBC: 21.5 10*3/uL — ABNORMAL HIGH (ref 4.0–10.5)
nRBC: 0.1 % (ref 0.0–0.2)

## 2020-11-02 LAB — COMPREHENSIVE METABOLIC PANEL
ALT: 38 U/L (ref 0–44)
AST: 35 U/L (ref 15–41)
Albumin: 3.6 g/dL (ref 3.5–5.0)
Alkaline Phosphatase: 109 U/L (ref 38–126)
Anion gap: 9 (ref 5–15)
BUN: 13 mg/dL (ref 8–23)
CO2: 28 mmol/L (ref 22–32)
Calcium: 9 mg/dL (ref 8.9–10.3)
Chloride: 100 mmol/L (ref 98–111)
Creatinine, Ser: 0.64 mg/dL (ref 0.44–1.00)
GFR, Estimated: >60 mL/min (ref >60)
Glucose, Bld: 110 mg/dL — ABNORMAL HIGH (ref 70–99)
Potassium: 3.9 mmol/L (ref 3.5–5.1)
Sodium: 137 mmol/L (ref 135–145)
Total Bilirubin: 0.4 mg/dL (ref 0.3–1.2)
Total Protein: 6.8 g/dL (ref 6.5–8.1)

## 2020-11-02 MED ORDER — HEPARIN SOD (PORK) LOCK FLUSH 100 UNIT/ML IV SOLN
500.0000 [IU] | Freq: Once | INTRAVENOUS | Status: AC | PRN
  Administered 2020-11-02: 500 [IU]
  Filled 2020-11-02: qty 5

## 2020-11-02 MED ORDER — SODIUM CHLORIDE 0.9 % IV SOLN
1800.0000 mg | Freq: Once | INTRAVENOUS | Status: AC
  Administered 2020-11-02: 1800 mg via INTRAVENOUS
  Filled 2020-11-02: qty 26.3

## 2020-11-02 MED ORDER — SODIUM CHLORIDE 0.9 % IV SOLN
210.0000 mg | Freq: Once | INTRAVENOUS | Status: AC
  Administered 2020-11-02: 210 mg via INTRAVENOUS
  Filled 2020-11-02: qty 21

## 2020-11-02 MED ORDER — SODIUM CHLORIDE 0.9 % IV SOLN
Freq: Once | INTRAVENOUS | Status: DC
  Filled 2020-11-02: qty 250

## 2020-11-02 MED ORDER — LIDOCAINE 5 % EX PTCH: 1.0000 | MEDICATED_PATCH | CUTANEOUS | 0 refills | Status: DC

## 2020-11-02 MED ORDER — SODIUM CHLORIDE 0.9% FLUSH
10.0000 mL | Freq: Once | INTRAVENOUS | Status: AC
  Administered 2020-11-02: 10 mL via INTRAVENOUS
  Filled 2020-11-02: qty 10

## 2020-11-02 MED ORDER — PALONOSETRON HCL INJECTION 0.25 MG/5ML
0.2500 mg | Freq: Once | INTRAVENOUS | Status: AC
  Administered 2020-11-02: 0.25 mg via INTRAVENOUS
  Filled 2020-11-02: qty 5

## 2020-11-02 MED ORDER — SODIUM CHLORIDE 0.9 % IV SOLN
10.0000 mg | Freq: Once | INTRAVENOUS | Status: AC
  Administered 2020-11-02: 10 mg via INTRAVENOUS
  Filled 2020-11-02: qty 10

## 2020-11-02 MED ORDER — SODIUM CHLORIDE 0.9 % IV SOLN
Freq: Once | INTRAVENOUS | Status: AC
  Filled 2020-11-02: qty 250

## 2020-11-02 MED ORDER — HEPARIN SOD (PORK) LOCK FLUSH 100 UNIT/ML IV SOLN
INTRAVENOUS | Status: AC
  Filled 2020-11-02: qty 5

## 2020-11-02 NOTE — Telephone Encounter (Signed)
Called pt and let her know that dr Janese Banks suggested to use lidoderm patch for under arm and breast cancer site. She says that if it costs too much sheena /anne help her with the cost. I have sent them both a message to see if that is ok for this rx

## 2020-11-02 NOTE — Progress Notes (Signed)
Pt weak by the end of the week on chemo week, she feels that her hiatal hernia is worse on tx. Week and SOB on exertion on treatment week and some days after that week. Under her right axilla itching and burning at times- using hydrocortisone. Feels like belly swelling. Feels like she needs fluids on Friday to get her going for weekend and next week.

## 2020-11-02 NOTE — Progress Notes (Signed)
1228- Patient tolerated treatment well. Patient stable and discharged to home at this time.

## 2020-11-04 ENCOUNTER — Encounter: Payer: Self-pay | Admitting: Oncology

## 2020-11-04 NOTE — Progress Notes (Signed)
Hematology/Oncology Consult note [facility]  [phone] [phone]  Patient Care Team: Tonia Ghent, MD as PCP - General (Family Medicine) Bary Castilla, Forest Gleason, MD (General Surgery) Modesto Charon, MD (Family Medicine) Laneta Simmers as Physician Assistant (Urology) [name] Javier Docker, MD as Consulting Physician (Cardiology) Sindy Guadeloupe, MD as Consulting Physician (Hematology and Oncology) Noreene Filbert, MD as Referring Physician (Radiation Oncology)   Name of the patient: Debra Little  [id number]  12-Jan-1956   Date of visit: 11/04/20  Diagnosis- 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 in the past and has undergone breast biopsies which did not previously showed malignancy. More recently patient noted some red discoloration around her right perioral as well as a palpable mass which led to a diagnostic mammogram on the right side her prior mammogram in February 2020 was normal in August 2020 she was noted to have a 2.7 x 2 x 2.3 cm mass in her right breast along with 4 morphologically abnormal lymph nodes. She underwent breast biopsy as well as lymph node biopsy which showed grade 3 invasive mammary carcinoma. ER PR and HER-2/neunegative.Sections show high-grade invasive carcinoma with focal squamous differentiation and pinpoint keratin ideation. The features are concerning for metaplastic differentiation.[name] Lymphovascular invasion present. There was extranodal extension present on the lymph node biopsy.   Her family history is significant for breast cancer in her mother in her 56s. Father had colon cancer in his70s.Family history also significant for ovarian cancer in her maternal  aunt.  MRI of bilateral breasts showed: Primary right breast mass was 3.5 x 2.7 x 3.4 cm. There were surrounding numerous nodules consistent with satellite lesions. Extensive nodular non-mass enhancement throughout the right breast involving the upper and lower quadrants spanning 6.2 x 4.5 x 5.5 cm. Markedly enhancement of the left breast diffusely. Non-mass enhancement measures 1.9 x 2.4 cm. At least 3 abnormal lymph nodes in the right axilla. No abnormal left axillary lymph nodes. 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 Equivocal inferior breast and left axillary nodal hypermetabolism. No evidence of extrathoracic hypermetabolic metastases. Lateral right breast primary with right axillary nodal metastases  Patient had 3 cycles of neoadjuvant AC chemotherapy along with Keytruda. There was no significant response in her tumor and patient proceeded with bilateral mastectomy with targeted node dissection. 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 7 lymph nodes were positive for malignancy with extranodal extension overall cancer cellularity was 40% [id number].Patient completed 1 cycle of adjuvant AC chemotherapy and willcomplete weekly Taxol chemotherapy in April 2021. She completed post mastectomy radiation  PET scan in November 2021 was done for ongoing chest wall pain. It showed:1. Aggressive appearing and extensive recurrent right breast cancer with right chest wall involvement as detailed above. 2. No findings for mediastinal/hilar metastatic lymphadenopathy or metastatic pulmonary disease. 3. Single left hepatic lobe liver lesion consistent with metastatic focus. 4. Hypermetabolic soft tissue mass in the left oropharynx/tonsillar region suspicious for primary neoplasm.  NGS testing showed PIK3CA mutation and PD-L1 CPS score  of 95.  No other actionable mutation was seen.  Interval history-reports burning pain under her right arm which is not severe enough to use  tramadol or Dilaudid.  She states that keeping a pillow under her arm helps.  Bowel movements have currently been regular.  ECOG PS- 1 Pain scale- 3 Opioid associated constipation- no  Review of systems- Review of Systems  Constitutional: Positive for malaise/fatigue. Negative for chills, fever and weight loss.  HENT: Negative for congestion, ear discharge and nosebleeds.   Eyes: Negative for blurred vision.  Respiratory: Negative for cough, hemoptysis, sputum production, shortness of breath and wheezing.   Cardiovascular: Negative for chest pain, palpitations, orthopnea and claudication.  Gastrointestinal: Negative for abdominal pain, blood in stool, constipation, diarrhea, heartburn, melena, nausea and vomiting.  Genitourinary: Negative for dysuria, flank pain, frequency, hematuria and urgency.  Musculoskeletal: Negative for back pain, joint pain and myalgias.       Burning pain in the right arm  Skin: Negative for rash.  Neurological: Negative for dizziness, tingling, focal weakness, seizures, weakness and headaches.  Endo/Heme/Allergies: Does not bruise/bleed easily.  Psychiatric/Behavioral: Negative for depression and suicidal ideas. The patient does not have insomnia.       Allergies  Allergen Reactions  . Amitiza [Lubiprostone] Other (See Comments)    Overly effective at higher dose  . Bentyl [Dicyclomine Hcl]     Lack of effect  . Cortisone Other (See Comments)    flush  . Mobic [Meloxicam]     Palpitations.  But can tolerate aleve  . Nitrofurantoin Monohyd Macro   . Sulfonamide Derivatives Nausea Only  . Zoloft [Sertraline Hcl] Diarrhea and Nausea Only  . Buspar [Buspirone] Palpitations  . Celecoxib Rash    REACTION: unspecified  . Doxycycline Palpitations    chest pain  . Penicillins Rash     Past Medical History:   Diagnosis Date  . Anemia    d/t chemo - last Hgb 10.8  . Anxiety    sees Dr. [name]  . BRCA negative    Inv[facility]ae panel neg except CHEK2 VUS  . Breast cancer ([place]) 2020  . Cyst of breast    per [name]  . Dysrhythmia    Hx of palpitations  . Family history of breast cancer   . Family history of colon cancer   . Family history of leukemia   . Family history of ovarian cancer   . Fibromyalgia   . GERD (gastroesophageal reflux disease)   . Heart murmur   . Hemorrhoids   . Herpes, genital 04/2015   confirmed with HSV 2 IgG  . Hiatal hernia   . Hypercholesterolemia    Dr. Kyra Searles  . Hyperlipidemia   . IBS (irritable bowel syndrome)    constipation predominant  . IC (interstitial cystitis)    per [facility]  . Mild depression ([place])   . MVP (mitral valve prolapse)    [facility] cards eval 2012  . Osteopenia    2010/2017, DEXA at [facility];spine and fem neck  . PONV (postoperative nausea and vomiting)   . Vitamin D deficiency    history of     Past Surgical History:  Procedure Laterality Date  . ABDOMINAL HYSTERECTOMY    . APPENDECTOMY    . BLADDER SURGERY     1980's  . BREAST EXCISIONAL BIOPSY Right    benign  . BREAST EXCISIONAL BIOPSY Bilateral    benign  . COLONOSCOPY  09/2014   Dr. [name]  . COLONOSCOPY  2009   at [facility] 1 POLYP (BENIGN)  . excision of breast cysts     hx of multiple cyst aspirations  . EXPLORATORY LAPAROTOMY    .  MASTECTOMY MODIFIED RADICAL Bilateral 10/09/2019   Procedure: RIGHT MASTECTOMY MODIFIED RADICAL AND LEFT TOTAL MASTECTOMY;  Surgeon: Rolm Bookbinder, MD;  Location: [place];  Service: General;  Laterality: Bilateral;  . PORTA CATH INSERTION N/A 08/06/2019   Procedure: PORTA CATH INSERTION;  Surgeon: Katha Cabal, MD;  Location: [place] [facility];  Service: Cardiovascular;  Laterality: N/A;  . PORTACATH PLACEMENT Left 08/05/2019   Procedure: INSERTION PORT-A-CATH, Attempted;  Surgeon: Jules Husbands, MD;  Location: [facility];   Service: General;  Laterality: Left;  . TUBAL LIGATION      Social History   Socioeconomic History  . Marital status: Divorced    Spouse name: Not on file  . Number of children: 1  . Years of education: Not on file  . Highest education level: Not on file  Occupational History  . Occupation: [employer]  Tobacco Use  . Smoking status: Never Smoker  . Smokeless tobacco: Never Used  Vaping Use  . Vaping Use: Never used  Substance and Sexual Activity  . Alcohol use: No    Alcohol/week: 0.0 standard drinks  . Drug use: No  . Sexual activity: Yes    Birth control/protection: Surgical    Comment: Hysterectomy  Other Topics Concern  . Not on file  Social History Narrative   Married in February 14, 1997, divorced as of 2017-02-14, 1 son from previous relationship   Brother with MVA at 33, in rest home for many years as of 02/14/18- died of covid recently   Social Determinants of Radio broadcast assistant Strain: [place]   . Difficulty of Paying Living Expenses: Not hard at all  Food Insecurity: No Food Insecurity  . Worried About Charity fundraiser in the Last Year: Never true  . Ran Out of Food in the Last Year: Never true  Transportation Needs: No Transportation Needs  . Lack of Transportation (Medical): No  . Lack of Transportation (Non-Medical): No  Physical Activity: Inactive  . Days of Exercise per Week: 0 days  . Minutes of Exercise per Session: 0 min  Stress: No Stress Concern Present  . Feeling of Stress : Not at all  Social Connections: Not on file  Intimate Partner Violence: Not At Risk  . Fear of Current or Ex-Partner: No  . Emotionally Abused: No  . Physically Abused: No  . Sexually Abused: No    Family History  Problem Relation Age of Onset  . Breast cancer Mother 60  . Hypertension Mother   . Colon cancer Father 60  . Hyperlipidemia Father   . Ovarian cancer Maternal Aunt 80  . Breast cancer Cousin   . Gallbladder disease Paternal Grandmother   . Liver cancer Cousin 70        Malignant  . Cancer Maternal Uncle        unsure type     Current Outpatient Medications:  .  acetaminophen (TYLENOL) 650 MG CR tablet, Take 650 mg by mouth every 8 (eight) hours as needed for pain., Disp: , Rfl:  .  Ascorbic Acid (VITAMIN C) 100 MG tablet, Take 100 mg by mouth daily., Disp: , Rfl:  .  atorvastatin (LIPITOR) 40 MG tablet, Take 40 mg by mouth at bedtime. , Disp: , Rfl: 1 .  Calcium Carbonate-Vit D-Min (CALCIUM 1200 PO), Take 4 tablets by mouth daily., Disp: , Rfl:  .  cholecalciferol (VITAMIN D3) 25 MCG (1000 UNIT) tablet, Take 1 tablet (1,000 Units total) by mouth daily., Disp: , Rfl:  .  clonazePAM (KLONOPIN) 0.5 MG tablet, Take 0.5 mg by mouth 2 (two) times daily. , Disp: , Rfl:  .  dexamethasone (DECADRON) 4 MG tablet, Take 2 tablets (8 mg total) by mouth daily. Start the day after chemotherapy for 2 days. Take with food., Disp: 30 tablet, Rfl: 1 .  Dexlansoprazole 30 MG capsule, Take 1 capsule (30 mg total) by mouth daily., Disp: 30 capsule, Rfl: 6 .  DULoxetine (CYMBALTA) 60 MG capsule, Take 60 mg by mouth daily. , Disp: , Rfl:  .  furosemide (LASIX) 20 MG tablet, Take 1 tablet (20 mg total) by mouth every other day., Disp: , Rfl:  .  gabapentin (NEURONTIN) 100 MG capsule, Take 1-2 capsules (100-200 mg total) by mouth 3 (three) times daily as needed., Disp: 90 capsule, Rfl: 1 .  lidocaine-prilocaine (EMLA) cream, Apply to affected area once, Disp: 30 g, Rfl: 3 .  loratadine (CLARITIN) 10 MG tablet, Take 1 tablet (10 mg total) by mouth daily., Disp: , Rfl:  .  NON FORMULARY, Take 5 mLs by mouth 4 (four) times daily as needed. Swish and swallow 5-10 ml of Medicated Mouthwash 4 x per day as needed, Disp: , Rfl:  .  Plecanatide (TRULANCE) 3 MG TABS, Take 3 mg by mouth daily., Disp: 30 tablet, Rfl: 0 .  polyethylene glycol (MIRALAX / GLYCOLAX) 17 g packet, Take 17 g by mouth daily., Disp: , Rfl:  .  sennosides-docusate sodium (SENOKOT-S) 8.6-50 MG tablet, Take 1-2  tablets by mouth at bedtime. , Disp: , Rfl:  .  valACYclovir (VALTREX) 500 MG tablet, Take 1 tablet (500 mg total) by mouth daily., Disp: 90 tablet, Rfl: 3 .  vitamin B-12 (CYANOCOBALAMIN) 500 MCG tablet, Take 500 mcg by mouth daily., Disp: , Rfl:  .  HYDROmorphone HCl (DILAUDID) 1 MG/ML LIQD, Take 0.5 mLs (0.5 mg total) by mouth every 4 (four) hours as needed for severe pain. (Patient not taking: Reported on 10/12/2020), Disp: 90 mL, Rfl: 0 .  lactulose (CHRONULAC) 10 GM/15ML solution, Take 45 mLs (30 g total) by mouth 2 (two) times daily. (Patient not taking: Reported on 10/19/2020), Disp: 236 mL, Rfl: 1 .  lidocaine (LIDODERM) 5 %, Place 1 patch onto the skin daily. Remove & Discard patch within 12 hours or as directed by MD, Disp: 30 patch, Rfl: 0 .  OLANZapine (ZYPREXA) 10 MG tablet, Take 1 tablet (10 mg total) by mouth at bedtime. For nausea or vomiting (Patient not taking: Reported on 10/19/2020), Disp: 30 tablet, Rfl: 0 .  ondansetron (ZOFRAN) 8 MG tablet, Take 1 tablet (8 mg total) by mouth 2 (two) times daily as needed for refractory nausea / vomiting. Start on day 3 after chemotherapy. (Patient not taking: Reported on 11/02/2020), Disp: 30 tablet, Rfl: 1 .  predniSONE (STERAPRED UNI-PAK 21 TAB) 10 MG (21) TBPK tablet, Day 1: 2 tab before breakfast, 1 after lunch, 1 after dinner, & 2 at bedtime; Day 2: 1 tab before breakfast, 1 after lunch, 1 after dinner, & 2 at bedtime; Day 3: 1 tab before breakfast, 1 after lunch, 1 after dinner, & 1 at bedtime; Day 4: 1 tab before breakfast, 1 after lunch, & 1 at bedtime; Day 5: 1 tab before breakfast & 1 at bedtime; Day 6: 1 tablet before breakfast (Patient not taking: Reported on 11/02/2020), Disp: 21 tablet, Rfl: 0 .  prochlorperazine (COMPAZINE) 10 MG tablet, Take 1 tablet (10 mg total) by mouth every 6 (six) hours as needed (Nausea or vomiting). (Patient not  taking: Reported on 10/19/2020), Disp: 30 tablet, Rfl: 1 .  sucralfate (CARAFATE) 1 g tablet, Take  1 g by mouth 3 (three) times daily.  (Patient not taking: Reported on 09/24/2020), Disp: , Rfl:   Physical exam:  Vitals:   11/02/20 0859  BP: 106/63  Pulse: 84  Resp: 16  Temp: 98.4 F (36.9 C)  TempSrc: Tympanic  Weight: 158 lb 14.4 oz (72.1 kg)  Height: $Remove'5\' 7"'ayVbbLs$  (1.702 m)   Physical Exam Constitutional:      General: She is not in acute distress.    Comments: Appears fatigued  Eyes:     Extraocular Movements: EOM normal.  Cardiovascular:     Rate and Rhythm: Normal rate and regular rhythm.     Heart sounds: Normal heart sounds.  Pulmonary:     Effort: Pulmonary effort is normal.     Breath sounds: Normal breath sounds.  Abdominal:     General: Bowel sounds are normal.     Palpations: Abdomen is soft.  Musculoskeletal:     Cervical back: Normal range of motion.  Skin:    General: Skin is warm and dry.  Neurological:     Mental Status: She is alert and oriented to person, place, and time.     Breast exam: Patient is s/p bilateral mastectomy with a palpable pea-sized right chest wall nodule.   CMP Latest Ref Rng & Units 11/02/2020  Glucose 70 - 99 mg/dL 110(H)  BUN 8 - 23 mg/dL 13  Creatinine 0.44 - 1.00 mg/dL 0.64  Sodium 135 - 145 mmol/L 137  Potassium 3.5 - 5.1 mmol/L 3.9  Chloride 98 - 111 mmol/L 100  CO2 22 - 32 mmol/L 28  Calcium 8.9 - 10.3 mg/dL 9.0  Total Protein 6.5 - 8.1 g/dL 6.8  Total Bilirubin 0.3 - 1.2 mg/dL 0.4  Alkaline Phos 38 - 126 U/L 109  AST 15 - 41 U/L 35  ALT 0 - 44 U/L 38   CBC Latest Ref Rng & Units 11/02/2020  WBC 4.0 - 10.5 K/uL 21.5(H)  Hemoglobin 12.0 - 15.0 g/dL 11.1(L)  Hematocrit 36.0 - 46.0 % 34.2(L)  Platelets 150 - 400 K/uL 368    No images are attached to the encounter.  MR Brain W Wo Contrast  Result Date: 10/25/2020 CLINICAL DATA:  Breast cancer staging.  MRI 06/01/2020. EXAM: MRI HEAD WITHOUT AND WITH CONTRAST TECHNIQUE: Multiplanar, multiecho pulse sequences of the brain and surrounding structures were obtained  without and with intravenous contrast. CONTRAST:  63mL GADAVIST GADOBUTROL 1 MMOL/ML IV SOLN COMPARISON:  MRI 06/01/2020 FINDINGS: Brain: No acute infarction, hemorrhage, hydrocephalus, extra-axial collection or mass lesion. Minimal T2/FLAIR hyperintensities, which are nonspecific but likely represent age-appropriate chronic microvascular ischemic disease. No abnormal enhancement. Vascular: Major arterial flow voids are maintained at the skull base. Skull and upper cervical spine: Normal marrow signal. Sinuses/Orbits: Sinuses are clear.  Unremarkable orbits. Other: No mastoid effusions. IMPRESSION: No acute abnormality. No evidence of intracranial metastatic disease. Electronically Signed   By: Margaretha Sheffield MD   On: 10/25/2020 13:03   NM PET Image Restag (PS) Skull Base To Thigh  Result Date: 10/08/2020 CLINICAL DATA:  Subsequent treatment strategy for recurrent right breast cancer. EXAM: NUCLEAR MEDICINE PET SKULL BASE TO THIGH TECHNIQUE: 8.79 mCi F-18 FDG was injected intravenously. Full-ring PET imaging was performed from the skull base to thigh after the radiotracer. CT data was obtained and used for attenuation correction and anatomic localization. Fasting blood glucose: 81 mg/dl COMPARISON:  PET-CT  08/06/2019 FINDINGS: Mediastinal blood pool activity: SUV max 2.41 Liver activity: SUV max NA NECK: There is an ill-defined soft tissue mass in the left oropharynx/tonsillar region measuring approximately 16 x 15 mm. It is hypermetabolic with SUV max of 5.57. This would be an unusual place for metastatic disease. It could be a primary mucosal tonsillar lesion. Recommend direct visualization and biopsy if indicated. No neck adenopathy. Incidental CT findings: none CHEST: Right-sided subclavicular soft tissue mass measuring approximately 14 mm. SUV max is 15.52. There is also a second soft tissue mass in the right subpectoral region measuring 24 mm with an SUV max of 10.53. Findings consistent with metastatic  disease. Right-sided anterior chest wall mass measures 24 mm on image number 32/2 is hypermetabolic with SUV max of 02.54. 2.7 cm mass in the right anterior chest wall in the region the internal mammary vessels and lymph nodes. There is associated destructive bony changes involving the right aspect of the sternum. I suspect this is most likely an aggressive nodal metastasis invading/destroying the sternum. SUV max is 12.07. In the lower right anterolateral chest wall/lower right breast area there is an ill-defined 22 mm soft tissue mass with hypermetabolism and SUV max of 12.72. No enlarged or hypermetabolic mediastinal or hilar lymph nodes and no worrisome hypermetabolic pulmonary lesions to suggest metastatic disease. Incidental CT findings: Stable radiation changes involving the anterior aspect of the right lung. ABDOMEN/PELVIS: Single low-attenuation lesion in the left hepatic lobe anteriorly in segment 3. This measures approximately 13 mm and is hypermetabolic with SUV max of 2.70. Findings consistent with a metastatic focus. No findings for metastatic disease involving the adrenal glands. No enlarged or hypermetabolic abdominal/pelvic lymph nodes. Incidental CT findings: none SKELETON: No focal hypermetabolic activity to suggest skeletal metastasis. Incidental CT findings: none IMPRESSION: 1. Aggressive appearing and extensive recurrent right breast cancer with right chest wall involvement as detailed above. 2. No findings for mediastinal/hilar metastatic lymphadenopathy or metastatic pulmonary disease. 3. Single left hepatic lobe liver lesion consistent with metastatic focus. 4. Hypermetabolic soft tissue mass in the left oropharynx/tonsillar region suspicious for primary neoplasm. Recommend ENT consultation and direct visualization and biopsy as indicated. 5. No findings suspicious for osseous metastatic disease. Electronically Signed   By: Marijo Sanes M.D.   On: 10/08/2020 09:33     Assessment and  plan- Patient is a 64 y.o. 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  She is here for regular assessment prior to cycle 2-day one of carboplatin and gemcitabine  Counseling to proceed with cycle 2-day 1 of carboplatin and gemcitabine today.  She will proceed with cycle 2-day 8 next week and I will see her back in 3 weeks for cycle 3-day 1.  Plan to repeat scans after 3 cycles.NGS testing showed PIK 3 CA mutation.  Piqray is the targeted drug for this mutation but is typically used in ER positive disease.  It is unclear if she would respond to it in an ER negative setting.  But this is something that could be considered down the line if she does not respond to conventional chemo.  She also has a CPS score of 95 on her PD-L1 testing indicating possible response to immunotherapy.  If she does not have a good response to carboplatin and gemcitabine after 3 cycles I will consider switching her to Tecentriq and Abraxane  Patient does report fatigue with chemotherapy and reports feeling better  with IV fluids.  We will schedule her for fluids on 11/05/2020 as well as 11/12/2020.    Visit Diagnosis 1. At risk for fluid and electrolyte imbalance   2. Encounter for antineoplastic chemotherapy   3. Metastatic breast cancer [facility])      Dr. Randa Evens, MD, MPH [facility] at [facility] [phone] 11/04/2020 1:32 PM

## 2020-11-05 ENCOUNTER — Inpatient Hospital Stay: Payer: PPO

## 2020-11-09 ENCOUNTER — Other Ambulatory Visit: Payer: Self-pay

## 2020-11-09 ENCOUNTER — Inpatient Hospital Stay: Payer: PPO | Admitting: Oncology

## 2020-11-09 ENCOUNTER — Inpatient Hospital Stay: Payer: PPO

## 2020-11-09 ENCOUNTER — Other Ambulatory Visit: Payer: PPO

## 2020-11-09 ENCOUNTER — Other Ambulatory Visit: Payer: Self-pay | Admitting: Nurse Practitioner

## 2020-11-09 ENCOUNTER — Ambulatory Visit: Payer: PPO | Admitting: Oncology

## 2020-11-09 VITALS — BP 105/67 | HR 91 | Temp 96.4°F | Resp 16 | Wt 160.0 lb

## 2020-11-09 DIAGNOSIS — C50511 Malignant neoplasm of lower-outer quadrant of right female breast: Secondary | ICD-10-CM

## 2020-11-09 DIAGNOSIS — Z5111 Encounter for antineoplastic chemotherapy: Secondary | ICD-10-CM | POA: Diagnosis not present

## 2020-11-09 LAB — CBC WITH DIFFERENTIAL/PLATELET
Abs Immature Granulocytes: 0.09 10*3/uL — ABNORMAL HIGH (ref 0.00–0.07)
Basophils Absolute: 0 10*3/uL (ref 0.0–0.1)
Basophils Relative: 1 %
Eosinophils Absolute: 0.1 10*3/uL (ref 0.0–0.5)
Eosinophils Relative: 1 %
HCT: 31.6 % — ABNORMAL LOW (ref 36.0–46.0)
Hemoglobin: 10.4 g/dL — ABNORMAL LOW (ref 12.0–15.0)
Immature Granulocytes: 2 %
Lymphocytes Relative: 16 %
Lymphs Abs: 0.9 10*3/uL (ref 0.7–4.0)
MCH: 28.5 pg (ref 26.0–34.0)
MCHC: 32.9 g/dL (ref 30.0–36.0)
MCV: 86.6 fL (ref 80.0–100.0)
Monocytes Absolute: 0.9 10*3/uL (ref 0.1–1.0)
Monocytes Relative: 16 %
Neutro Abs: 3.7 10*3/uL (ref 1.7–7.7)
Neutrophils Relative %: 64 %
Platelets: 212 10*3/uL (ref 150–400)
RBC: 3.65 MIL/uL — ABNORMAL LOW (ref 3.87–5.11)
RDW: 14.6 % (ref 11.5–15.5)
WBC: 5.8 10*3/uL (ref 4.0–10.5)
nRBC: 0 % (ref 0.0–0.2)

## 2020-11-09 LAB — COMPREHENSIVE METABOLIC PANEL
ALT: 39 U/L (ref 0–44)
AST: 30 U/L (ref 15–41)
Albumin: 3.4 g/dL — ABNORMAL LOW (ref 3.5–5.0)
Alkaline Phosphatase: 71 U/L (ref 38–126)
Anion gap: 13 (ref 5–15)
BUN: 13 mg/dL (ref 8–23)
CO2: 25 mmol/L (ref 22–32)
Calcium: 8.8 mg/dL — ABNORMAL LOW (ref 8.9–10.3)
Chloride: 99 mmol/L (ref 98–111)
Creatinine, Ser: 0.63 mg/dL (ref 0.44–1.00)
GFR, Estimated: >60 mL/min (ref >60)
Glucose, Bld: 112 mg/dL — ABNORMAL HIGH (ref 70–99)
Potassium: 3.7 mmol/L (ref 3.5–5.1)
Sodium: 137 mmol/L (ref 135–145)
Total Bilirubin: 0.6 mg/dL (ref 0.3–1.2)
Total Protein: 6.7 g/dL (ref 6.5–8.1)

## 2020-11-09 MED ORDER — SODIUM CHLORIDE 0.9% FLUSH
10.0000 mL | Freq: Once | INTRAVENOUS | Status: AC
  Administered 2020-11-09: 10 mL via INTRAVENOUS
  Filled 2020-11-09: qty 10

## 2020-11-09 MED ORDER — HEPARIN SOD (PORK) LOCK FLUSH 100 UNIT/ML IV SOLN
500.0000 [IU] | Freq: Once | INTRAVENOUS | Status: DC | PRN
  Filled 2020-11-09: qty 5

## 2020-11-09 MED ORDER — CARBOPLATIN CHEMO INJECTION 450 MG/45ML
210.0000 mg | Freq: Once | INTRAVENOUS | Status: AC
  Administered 2020-11-09: 210 mg via INTRAVENOUS
  Filled 2020-11-09: qty 21

## 2020-11-09 MED ORDER — SODIUM CHLORIDE 0.9 % IV SOLN
1800.0000 mg | Freq: Once | INTRAVENOUS | Status: AC
  Administered 2020-11-09: 1800 mg via INTRAVENOUS
  Filled 2020-11-09: qty 26.3

## 2020-11-09 MED ORDER — SODIUM CHLORIDE 0.9% FLUSH
10.0000 mL | INTRAVENOUS | Status: DC | PRN
  Administered 2020-11-09 (×2): 10 mL
  Filled 2020-11-09: qty 10

## 2020-11-09 MED ORDER — HEPARIN SOD (PORK) LOCK FLUSH 100 UNIT/ML IV SOLN
500.0000 [IU] | Freq: Once | INTRAVENOUS | Status: AC
  Administered 2020-11-09: 500 [IU] via INTRAVENOUS
  Filled 2020-11-09: qty 5

## 2020-11-09 MED ORDER — PEGFILGRASTIM 6 MG/0.6ML ~~LOC~~ PSKT
6.0000 mg | PREFILLED_SYRINGE | Freq: Once | SUBCUTANEOUS | Status: AC
  Administered 2020-11-09: 6 mg via SUBCUTANEOUS
  Filled 2020-11-09: qty 0.6

## 2020-11-09 MED ORDER — HEPARIN SOD (PORK) LOCK FLUSH 100 UNIT/ML IV SOLN
INTRAVENOUS | Status: AC
  Filled 2020-11-09: qty 5

## 2020-11-09 MED ORDER — SODIUM CHLORIDE 0.9 % IV SOLN
Freq: Once | INTRAVENOUS | Status: AC
  Filled 2020-11-09: qty 250

## 2020-11-09 MED ORDER — SODIUM CHLORIDE 0.9 % IV SOLN
10.0000 mg | Freq: Once | INTRAVENOUS | Status: AC
  Administered 2020-11-09: 10 mg via INTRAVENOUS
  Filled 2020-11-09: qty 10

## 2020-11-09 MED ORDER — SODIUM CHLORIDE 0.9 % IV SOLN
Freq: Once | INTRAVENOUS | Status: DC
  Filled 2020-11-09: qty 250

## 2020-11-09 MED ORDER — TRIAMCINOLONE ACETONIDE 0.1 % EX CREA: 1.0000 "application " | TOPICAL_CREAM | Freq: Two times a day (BID) | CUTANEOUS | 0 refills | Status: AC | PRN

## 2020-11-09 MED ORDER — PALONOSETRON HCL INJECTION 0.25 MG/5ML
0.2500 mg | Freq: Once | INTRAVENOUS | Status: AC
  Administered 2020-11-09: 0.25 mg via INTRAVENOUS
  Filled 2020-11-09: qty 5

## 2020-11-09 NOTE — Progress Notes (Signed)
Tolerated paclitaxel and carboplatin well. Neulasta OBI applied to left posterior upper arm. Discharged home in stable condition.

## 2020-11-11 ENCOUNTER — Other Ambulatory Visit: Payer: Self-pay

## 2020-11-11 ENCOUNTER — Other Ambulatory Visit: Payer: Self-pay | Admitting: Gastroenterology

## 2020-11-11 MED ORDER — TRULANCE 3 MG PO TABS: 3.0000 mg | ORAL_TABLET | Freq: Every day | ORAL | 11 refills | Status: AC

## 2020-11-12 ENCOUNTER — Other Ambulatory Visit: Payer: Self-pay

## 2020-11-12 ENCOUNTER — Inpatient Hospital Stay: Payer: PPO

## 2020-11-12 VITALS — BP 99/55 | HR 98 | Temp 98.0°F

## 2020-11-12 DIAGNOSIS — Z9189 Other specified personal risk factors, not elsewhere classified: Secondary | ICD-10-CM

## 2020-11-12 DIAGNOSIS — Z5111 Encounter for antineoplastic chemotherapy: Secondary | ICD-10-CM | POA: Diagnosis not present

## 2020-11-12 MED ORDER — SODIUM CHLORIDE 0.9 % IV SOLN
Freq: Once | INTRAVENOUS | Status: AC
  Filled 2020-11-12: qty 250

## 2020-11-12 NOTE — Progress Notes (Signed)
Pt tolerated infusion well with no complications. VSS. RN educated pt on the importance of calling the clinic if any complications occur at this time, pt verbalized understanding and all questions answered at this time.   Lawyer Washabaugh [employer]

## 2020-11-16 ENCOUNTER — Inpatient Hospital Stay: Payer: PPO | Admitting: Oncology

## 2020-11-16 ENCOUNTER — Inpatient Hospital Stay: Payer: PPO

## 2020-11-23 ENCOUNTER — Encounter: Payer: Self-pay | Admitting: Oncology

## 2020-11-23 ENCOUNTER — Inpatient Hospital Stay: Payer: PPO

## 2020-11-23 ENCOUNTER — Inpatient Hospital Stay (HOSPITAL_BASED_OUTPATIENT_CLINIC_OR_DEPARTMENT_OTHER): Payer: PPO | Admitting: Oncology

## 2020-11-23 VITALS — BP 120/64 | HR 82 | Temp 98.4°F | Resp 16 | Ht 67.0 in | Wt 162.6 lb

## 2020-11-23 DIAGNOSIS — T451X5A Adverse effect of antineoplastic and immunosuppressive drugs, initial encounter: Secondary | ICD-10-CM | POA: Diagnosis not present

## 2020-11-23 DIAGNOSIS — C50511 Malignant neoplasm of lower-outer quadrant of right female breast: Secondary | ICD-10-CM

## 2020-11-23 DIAGNOSIS — Z5111 Encounter for antineoplastic chemotherapy: Secondary | ICD-10-CM

## 2020-11-23 DIAGNOSIS — G893 Neoplasm related pain (acute) (chronic): Secondary | ICD-10-CM

## 2020-11-23 DIAGNOSIS — R5383 Other fatigue: Secondary | ICD-10-CM | POA: Diagnosis not present

## 2020-11-23 DIAGNOSIS — Z171 Estrogen receptor negative status [ER-]: Secondary | ICD-10-CM

## 2020-11-23 DIAGNOSIS — C50811 Malignant neoplasm of overlapping sites of right female breast: Secondary | ICD-10-CM

## 2020-11-23 DIAGNOSIS — D6481 Anemia due to antineoplastic chemotherapy: Secondary | ICD-10-CM

## 2020-11-23 LAB — COMPREHENSIVE METABOLIC PANEL
ALT: 44 U/L (ref 0–44)
AST: 36 U/L (ref 15–41)
Albumin: 3.7 g/dL (ref 3.5–5.0)
Alkaline Phosphatase: 99 U/L (ref 38–126)
Anion gap: 11 (ref 5–15)
BUN: 13 mg/dL (ref 8–23)
CO2: 26 mmol/L (ref 22–32)
Calcium: 9 mg/dL (ref 8.9–10.3)
Chloride: 99 mmol/L (ref 98–111)
Creatinine, Ser: 0.77 mg/dL (ref 0.44–1.00)
GFR, Estimated: >60 mL/min (ref >60)
Glucose, Bld: 128 mg/dL — ABNORMAL HIGH (ref 70–99)
Potassium: 3.8 mmol/L (ref 3.5–5.1)
Sodium: 136 mmol/L (ref 135–145)
Total Bilirubin: 0.4 mg/dL (ref 0.3–1.2)
Total Protein: 6.9 g/dL (ref 6.5–8.1)

## 2020-11-23 LAB — CBC WITH DIFFERENTIAL/PLATELET
Abs Immature Granulocytes: 0.58 10*3/uL — ABNORMAL HIGH (ref 0.00–0.07)
Basophils Absolute: 0.1 10*3/uL (ref 0.0–0.1)
Basophils Relative: 1 %
Eosinophils Absolute: 0.1 10*3/uL (ref 0.0–0.5)
Eosinophils Relative: 1 %
HCT: 33.6 % — ABNORMAL LOW (ref 36.0–46.0)
Hemoglobin: 10.7 g/dL — ABNORMAL LOW (ref 12.0–15.0)
Immature Granulocytes: 5 %
Lymphocytes Relative: 8 %
Lymphs Abs: 1 10*3/uL (ref 0.7–4.0)
MCH: 28.6 pg (ref 26.0–34.0)
MCHC: 31.8 g/dL (ref 30.0–36.0)
MCV: 89.8 fL (ref 80.0–100.0)
Monocytes Absolute: 1.4 10*3/uL — ABNORMAL HIGH (ref 0.1–1.0)
Monocytes Relative: 11 %
Neutro Abs: 9.1 10*3/uL — ABNORMAL HIGH (ref 1.7–7.7)
Neutrophils Relative %: 74 %
Platelets: 321 10*3/uL (ref 150–400)
RBC: 3.74 MIL/uL — ABNORMAL LOW (ref 3.87–5.11)
RDW: 18.6 % — ABNORMAL HIGH (ref 11.5–15.5)
WBC: 12.2 10*3/uL — ABNORMAL HIGH (ref 4.0–10.5)
nRBC: 0 % (ref 0.0–0.2)

## 2020-11-23 MED ORDER — SODIUM CHLORIDE 0.9 % IV SOLN
1800.0000 mg | Freq: Once | INTRAVENOUS | Status: AC
  Administered 2020-11-23: 1800 mg via INTRAVENOUS
  Filled 2020-11-23: qty 26.3

## 2020-11-23 MED ORDER — HEPARIN SOD (PORK) LOCK FLUSH 100 UNIT/ML IV SOLN
INTRAVENOUS | Status: AC
  Filled 2020-11-23: qty 5

## 2020-11-23 MED ORDER — PREGABALIN 75 MG PO CAPS: 75.0000 mg | ORAL_CAPSULE | Freq: Two times a day (BID) | ORAL | 2 refills | Status: AC

## 2020-11-23 MED ORDER — SODIUM CHLORIDE 0.9% FLUSH
10.0000 mL | INTRAVENOUS | Status: DC | PRN
  Administered 2020-11-23: 10 mL via INTRAVENOUS
  Filled 2020-11-23: qty 10

## 2020-11-23 MED ORDER — SODIUM CHLORIDE 0.9 % IV SOLN
210.0000 mg | Freq: Once | INTRAVENOUS | Status: AC
  Administered 2020-11-23: 210 mg via INTRAVENOUS
  Filled 2020-11-23: qty 21

## 2020-11-23 MED ORDER — DEXAMETHASONE SODIUM PHOSPHATE 10 MG/ML IJ SOLN
4.0000 mg | Freq: Once | INTRAMUSCULAR | Status: AC
  Administered 2020-11-23: 4 mg via INTRAVENOUS
  Filled 2020-11-23: qty 1

## 2020-11-23 MED ORDER — SODIUM CHLORIDE 0.9 % IV SOLN
Freq: Once | INTRAVENOUS | Status: DC
  Filled 2020-11-23: qty 250

## 2020-11-23 MED ORDER — HEPARIN SOD (PORK) LOCK FLUSH 100 UNIT/ML IV SOLN
500.0000 [IU] | Freq: Once | INTRAVENOUS | Status: AC | PRN
  Administered 2020-11-23: 500 [IU]
  Filled 2020-11-23: qty 5

## 2020-11-23 MED ORDER — SODIUM CHLORIDE 0.9 % IV SOLN
Freq: Once | INTRAVENOUS | Status: AC
  Filled 2020-11-23: qty 250

## 2020-11-23 MED ORDER — PALONOSETRON HCL INJECTION 0.25 MG/5ML
0.2500 mg | Freq: Once | INTRAVENOUS | Status: AC
  Administered 2020-11-23: 0.25 mg via INTRAVENOUS
  Filled 2020-11-23: qty 5

## 2020-11-23 MED ORDER — HEPARIN SOD (PORK) LOCK FLUSH 100 UNIT/ML IV SOLN
500.0000 [IU] | Freq: Once | INTRAVENOUS | Status: DC
  Filled 2020-11-23: qty 5

## 2020-11-23 MED ORDER — SODIUM CHLORIDE 0.9 % IV SOLN: 4.0000 mg | Freq: Once | INTRAVENOUS | Status: DC

## 2020-11-23 MED ORDER — SODIUM CHLORIDE 0.9% FLUSH
10.0000 mL | INTRAVENOUS | Status: DC | PRN
  Administered 2020-11-23: 10 mL
  Filled 2020-11-23: qty 10

## 2020-11-23 NOTE — Progress Notes (Signed)
Pt has 3 issues today- the spot under her right arm with on and off shooting pains and the only thing that helps is holding it up higher and putting pillow under it. Then b/p low she says at home, and premed that she gets in chemo causes her to have hear rate in 90's and does not like the feeling

## 2020-11-23 NOTE — Progress Notes (Signed)
Tolerated gemcitabine and carboplatin well. Discharged home in stable condition. 

## 2020-11-23 NOTE — Progress Notes (Signed)
Hematology/Oncology Consult note [facility]  Telephone:([phone] [phone]  Patient Care Team: Tonia Ghent, MD as PCP - General (Family Medicine) Bary Castilla, Forest Gleason, MD (General Surgery) Modesto Charon, MD (Family Medicine) Laneta Simmers as Physician Assistant (Urology) [name] Javier Docker, MD as Consulting Physician (Cardiology) Sindy Guadeloupe, MD as Consulting Physician (Hematology and Oncology) Noreene Filbert, MD as Referring Physician (Radiation Oncology)   Name of the patient: Debra Little  [id number]  05-07-1956   Date of visit: 11/23/20  Diagnosis- 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 in the past and has undergone breast biopsies which did not previously showed malignancy. More recently patient noted some red discoloration around her right perioral as well as a palpable mass which led to a diagnostic mammogram on the right side her prior mammogram in February 2020 was normal in August 2020 she was noted to have a 2.7 x 2 x 2.3 cm mass in her right breast along with 4 morphologically abnormal lymph nodes. She underwent breast biopsy as well as lymph node biopsy which showed grade 3 invasive mammary carcinoma. ER PR and HER-2/neunegative.Sections show high-grade invasive carcinoma with focal squamous differentiation and pinpoint keratin ideation. The features are concerning for metaplastic differentiation.[name] Lymphovascular invasion present. There was extranodal extension present on the lymph node biopsy.   Her family history is significant for breast cancer in her mother in her 63s. Father had colon cancer in his70s.Family history also significant for ovarian cancer in her maternal  aunt.  MRI of bilateral breasts showed: Primary right breast mass was 3.5 x 2.7 x 3.4 cm. There were surrounding numerous nodules consistent with satellite lesions. Extensive nodular non-mass enhancement throughout the right breast involving the upper and lower quadrants spanning 6.2 x 4.5 x 5.5 cm. Markedly enhancement of the left breast diffusely. Non-mass enhancement measures 1.9 x 2.4 cm. At least 3 abnormal lymph nodes in the right axilla. No abnormal left axillary lymph nodes. 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 Equivocal inferior breast and left axillary nodal hypermetabolism. No evidence of extrathoracic hypermetabolic metastases. Lateral right breast primary with right axillary nodal metastases  Patient had 3 cycles of neoadjuvant AC chemotherapy along with Keytruda. There was no significant response in her tumor and patient proceeded with bilateral mastectomy with targeted node dissection. 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 7 lymph nodes were positive for malignancy with extranodal extension overall cancer cellularity was 40% [id number].Patient completed 1 cycle of adjuvant AC chemotherapy and willcomplete weekly Taxol chemotherapy in April 2021. She completed post mastectomy radiation  PET scan in November 2021 was done for ongoing chest wall pain. It showed:1. Aggressive appearing and extensive recurrent right breast cancer with right chest wall involvement as detailed above. 2. No findings for mediastinal/hilar metastatic lymphadenopathy or metastatic pulmonary disease. 3. Single left hepatic lobe liver lesion consistent with metastatic focus. 4. Hypermetabolic soft tissue mass in the left oropharynx/tonsillar region suspicious for primary neoplasm.  NGS testing showed PIK3CA mutation and PD-L1 CPS  score of 95.  No other actionable mutation was seen.  Interval history-continues to have burning pain under her right arm.  She has only used  1 dose of Dilaudid so far and uses occasionally Tylenol.  States that gabapentin has not been helping her as much.  Bowel movements are regular with senna and MiraLAX.  Decadron been given as a premedication makes her feel jittery.  She is concerned about her blood pressure is ranging in the nineties two hundreds at home.  She has not had any syncopal episodes or lightheadedness.  She got married last week ECOG PS- 1 Pain scale- 4 Opioid associated constipation- no  Review of systems- Review of Systems  Constitutional: Positive for malaise/fatigue. Negative for chills, fever and weight loss.  HENT: Negative for congestion, ear discharge and nosebleeds.   Eyes: Negative for blurred vision.  Respiratory: Negative for cough, hemoptysis, sputum production, shortness of breath and wheezing.   Cardiovascular: Negative for chest pain, palpitations, orthopnea and claudication.  Gastrointestinal: Negative for abdominal pain, blood in stool, constipation, diarrhea, heartburn, melena, nausea and vomiting.  Genitourinary: Negative for dysuria, flank pain, frequency, hematuria and urgency.  Musculoskeletal: Negative for back pain, joint pain and myalgias.       Burning pain in the right arm  Skin: Negative for rash.  Neurological: Negative for dizziness, tingling, focal weakness, seizures, weakness and headaches.  Endo/Heme/Allergies: Does not bruise/bleed easily.  Psychiatric/Behavioral: Negative for depression and suicidal ideas. The patient does not have insomnia.        Allergies  Allergen Reactions  . Amitiza [Lubiprostone] Other (See Comments)    Overly effective at higher dose  . Bentyl [Dicyclomine Hcl]     Lack of effect  . Cortisone Other (See Comments)    flush  . Mobic [Meloxicam]     Palpitations.  But can tolerate aleve  . Nitrofurantoin  Monohyd Macro   . Sulfonamide Derivatives Nausea Only  . Zoloft [Sertraline Hcl] Diarrhea and Nausea Only  . Buspar [Buspirone] Palpitations  . Celecoxib Rash    REACTION: unspecified  . Doxycycline Palpitations    chest pain  . Penicillins Rash     Past Medical History:  Diagnosis Date  . Anemia    d/t chemo - last Hgb 10.8  . Anxiety    sees Dr. [name]  . BRCA negative    Invitae panel neg except CHEK2 VUS  . Breast cancer ([place]) 2020  . Cyst of breast    per [name]  . Dysrhythmia    Hx of palpitations  . Family history of breast cancer   . Family history of colon cancer   . Family history of leukemia   . Family history of ovarian cancer   . Fibromyalgia   . GERD (gastroesophageal reflux disease)   . Heart murmur   . Hemorrhoids   . Herpes, genital 04/2015   confirmed with HSV 2 IgG  . Hiatal hernia   . Hypercholesterolemia    Dr. Kyra Searles  . Hyperlipidemia   . IBS (irritable bowel syndrome)    constipation predominant  . IC (interstitial cystitis)    per [facility]  . Mild depression ([place])   . MVP (mitral valve prolapse)    [facility] cards eval 2012  . Osteopenia    2010/2017, DEXA at [facility];spine and fem neck  . PONV (postoperative nausea and vomiting)   . Vitamin D deficiency    history of     Past Surgical History:  Procedure Laterality Date  . ABDOMINAL HYSTERECTOMY    . APPENDECTOMY    . BLADDER SURGERY     1980's  . BREAST EXCISIONAL BIOPSY Right  benign  . BREAST EXCISIONAL BIOPSY Bilateral    benign  . COLONOSCOPY  09/2014   Dr. [name]  . COLONOSCOPY  04-Mar-2008   at [facility] 1 POLYP (BENIGN)  . excision of breast cysts     hx of multiple cyst aspirations  . EXPLORATORY LAPAROTOMY    . MASTECTOMY MODIFIED RADICAL Bilateral 10/09/2019   Procedure: RIGHT MASTECTOMY MODIFIED RADICAL AND LEFT TOTAL MASTECTOMY;  Surgeon: Rolm Bookbinder, MD;  Location: [place];  Service: General;  Laterality: Bilateral;  . PORTA CATH INSERTION N/A 08/06/2019    Procedure: PORTA CATH INSERTION;  Surgeon: Katha Cabal, MD;  Location: [place] [facility];  Service: Cardiovascular;  Laterality: N/A;  . PORTACATH PLACEMENT Left 08/05/2019   Procedure: INSERTION PORT-A-CATH, Attempted;  Surgeon: Jules Husbands, MD;  Location: [facility];  Service: General;  Laterality: Left;  . TUBAL LIGATION      Social History   Socioeconomic History  . Marital status: Divorced    Spouse name: Not on file  . Number of children: 1  . Years of education: Not on file  . Highest education level: Not on file  Occupational History  . Occupation: [employer]  Tobacco Use  . Smoking status: Never Smoker  . Smokeless tobacco: Never Used  Vaping Use  . Vaping Use: Never used  Substance and Sexual Activity  . Alcohol use: No    Alcohol/week: 0.0 standard drinks  . Drug use: No  . Sexual activity: Yes    Birth control/protection: Surgical    Comment: Hysterectomy  Other Topics Concern  . Not on file  Social History Narrative   Married in March 04, 1997, divorced as of 03/04/17, 1 son from previous relationship   Brother with MVA at 55, in rest home for many years as of 04-Mar-2018- died of covid recently   Social Determinants of Radio broadcast assistant Strain: [place]   . Difficulty of Paying Living Expenses: Not hard at all  Food Insecurity: No Food Insecurity  . Worried About Charity fundraiser in the Last Year: Never true  . Ran Out of Food in the Last Year: Never true  Transportation Needs: No Transportation Needs  . Lack of Transportation (Medical): No  . Lack of Transportation (Non-Medical): No  Physical Activity: Inactive  . Days of Exercise per Week: 0 days  . Minutes of Exercise per Session: 0 min  Stress: No Stress Concern Present  . Feeling of Stress : Not at all  Social Connections: Not on file  Intimate Partner Violence: Not At Risk  . Fear of Current or Ex-Partner: No  . Emotionally Abused: No  . Physically Abused: No  . Sexually Abused: No    Family  History  Problem Relation Age of Onset  . Breast cancer Mother 73  . Hypertension Mother   . Colon cancer Father 21  . Hyperlipidemia Father   . Ovarian cancer Maternal Aunt 80  . Breast cancer Cousin   . Gallbladder disease Paternal Grandmother   . Liver cancer Cousin 70       Malignant  . Cancer Maternal Uncle        unsure type     Current Outpatient Medications:  .  acetaminophen (TYLENOL) 650 MG CR tablet, Take 650 mg by mouth every 8 (eight) hours as needed for pain., Disp: , Rfl:  .  Ascorbic Acid (VITAMIN C) 100 MG tablet, Take 100 mg by mouth daily., Disp: , Rfl:  .  atorvastatin (LIPITOR) 40  MG tablet, Take 40 mg by mouth at bedtime. , Disp: , Rfl: 1 .  Calcium Carbonate-Vit D-Min (CALCIUM 1200 PO), Take 4 tablets by mouth daily., Disp: , Rfl:  .  cholecalciferol (VITAMIN D3) 25 MCG (1000 UNIT) tablet, Take 1 tablet (1,000 Units total) by mouth daily., Disp: , Rfl:  .  clonazePAM (KLONOPIN) 0.5 MG tablet, Take 0.5 mg by mouth 2 (two) times daily. , Disp: , Rfl:  .  dexamethasone (DECADRON) 4 MG tablet, Take 2 tablets (8 mg total) by mouth daily. Start the day after chemotherapy for 2 days. Take with food., Disp: 30 tablet, Rfl: 1 .  Dexlansoprazole 30 MG capsule, Take 1 capsule (30 mg total) by mouth daily., Disp: 30 capsule, Rfl: 6 .  DULoxetine (CYMBALTA) 60 MG capsule, Take 60 mg by mouth daily. , Disp: , Rfl:  .  furosemide (LASIX) 20 MG tablet, Take 1 tablet (20 mg total) by mouth every other day., Disp: , Rfl:  .  gabapentin (NEURONTIN) 100 MG capsule, Take 1-2 capsules (100-200 mg total) by mouth 3 (three) times daily as needed., Disp: 90 capsule, Rfl: 1 .  HYDROmorphone HCl (DILAUDID) 1 MG/ML LIQD, Take 0.5 mLs (0.5 mg total) by mouth every 4 (four) hours as needed for severe pain. (Patient not taking: Reported on 10/12/2020), Disp: 90 mL, Rfl: 0 .  lactulose (CHRONULAC) 10 GM/15ML solution, Take 45 mLs (30 g total) by mouth 2 (two) times daily. (Patient not taking:  Reported on 10/19/2020), Disp: 236 mL, Rfl: 1 .  lidocaine (LIDODERM) 5 %, Place 1 patch onto the skin daily. Remove & Discard patch within 12 hours or as directed by MD, Disp: 30 patch, Rfl: 0 .  lidocaine-prilocaine (EMLA) cream, Apply to affected area once, Disp: 30 g, Rfl: 3 .  loratadine (CLARITIN) 10 MG tablet, Take 1 tablet (10 mg total) by mouth daily., Disp: , Rfl:  .  NON FORMULARY, Take 5 mLs by mouth 4 (four) times daily as needed. Swish and swallow 5-10 ml of Medicated Mouthwash 4 x per day as needed, Disp: , Rfl:  .  OLANZapine (ZYPREXA) 10 MG tablet, Take 1 tablet (10 mg total) by mouth at bedtime. For nausea or vomiting (Patient not taking: Reported on 10/19/2020), Disp: 30 tablet, Rfl: 0 .  ondansetron (ZOFRAN) 8 MG tablet, Take 1 tablet (8 mg total) by mouth 2 (two) times daily as needed for refractory nausea / vomiting. Start on day 3 after chemotherapy. (Patient not taking: Reported on 11/02/2020), Disp: 30 tablet, Rfl: 1 .  Plecanatide (TRULANCE) 3 MG TABS, Take 3 mg by mouth daily., Disp: 30 tablet, Rfl: 11 .  polyethylene glycol (MIRALAX / GLYCOLAX) 17 g packet, Take 17 g by mouth daily., Disp: , Rfl:  .  predniSONE (STERAPRED UNI-PAK 21 TAB) 10 MG (21) TBPK tablet, Day 1: 2 tab before breakfast, 1 after lunch, 1 after dinner, & 2 at bedtime; Day 2: 1 tab before breakfast, 1 after lunch, 1 after dinner, & 2 at bedtime; Day 3: 1 tab before breakfast, 1 after lunch, 1 after dinner, & 1 at bedtime; Day 4: 1 tab before breakfast, 1 after lunch, & 1 at bedtime; Day 5: 1 tab before breakfast & 1 at bedtime; Day 6: 1 tablet before breakfast (Patient not taking: Reported on 11/02/2020), Disp: 21 tablet, Rfl: 0 .  prochlorperazine (COMPAZINE) 10 MG tablet, Take 1 tablet (10 mg total) by mouth every 6 (six) hours as needed (Nausea or vomiting). (Patient not taking: Reported  on 10/19/2020), Disp: 30 tablet, Rfl: 1 .  sennosides-docusate sodium (SENOKOT-S) 8.6-50 MG tablet, Take 1-2 tablets by  mouth at bedtime. , Disp: , Rfl:  .  sucralfate (CARAFATE) 1 g tablet, Take 1 g by mouth 3 (three) times daily.  (Patient not taking: Reported on 09/24/2020), Disp: , Rfl:  .  triamcinolone (KENALOG) 0.1 %, Apply 1 application topically 2 (two) times daily as needed. For itching, Disp: 80 g, Rfl: 0 .  valACYclovir (VALTREX) 500 MG tablet, Take 1 tablet (500 mg total) by mouth daily., Disp: 90 tablet, Rfl: 3 .  vitamin B-12 (CYANOCOBALAMIN) 500 MCG tablet, Take 500 mcg by mouth daily., Disp: , Rfl:   Physical exam:  Vitals:   11/23/20 0851  BP: 120/64  Pulse: 82  Resp: 16  Temp: 98.4 F (36.9 C)  TempSrc: Oral  Weight: 162 lb 9.6 oz (73.8 kg)  Height: 5\' 7"  (1.702 m)   Physical Exam HENT:     Head: Normocephalic and atraumatic.  Eyes:     Extraocular Movements: EOM normal.     Pupils: Pupils are equal, round, and reactive to light.  Cardiovascular:     Rate and Rhythm: Normal rate and regular rhythm.     Heart sounds: Normal heart sounds.  Pulmonary:     Effort: Pulmonary effort is normal.     Breath sounds: Normal breath sounds.  Abdominal:     General: Bowel sounds are normal.     Palpations: Abdomen is soft.  Musculoskeletal:     Cervical back: Normal range of motion.     Comments: Right upper extremity appears more swollen than the left due to lymphedema.  Skin:    General: Skin is warm and dry.  Neurological:     Mental Status: She is alert and oriented to person, place, and time.      CMP Latest Ref Rng & Units 11/23/2020  Glucose 70 - 99 mg/dL 11/25/2020)  BUN 8 - 23 mg/dL 13  Creatinine 634(E - 4.44 mg/dL 8.80  Sodium 6.90 - 731 mmol/L 136  Potassium 3.5 - 5.1 mmol/L 3.8  Chloride 98 - 111 mmol/L 99  CO2 22 - 32 mmol/L 26  Calcium 8.9 - 10.3 mg/dL 9.0  Total Protein 6.5 - 8.1 g/dL 6.9  Total Bilirubin 0.3 - 1.2 mg/dL 0.4  Alkaline Phos 38 - 126 U/L 99  AST 15 - 41 U/L 36  ALT 0 - 44 U/L 44   CBC Latest Ref Rng & Units 11/23/2020  WBC 4.0 - 10.5 K/uL 12.2(H)   Hemoglobin 12.0 - 15.0 g/dL 10.7(L)  Hematocrit 36.0 - 46.0 % 33.6(L)  Platelets 150 - 400 K/uL 321    No images are attached to the encounter.  MR Brain W Wo Contrast  Result Date: 10/25/2020 CLINICAL DATA:  Breast cancer staging.  MRI 06/01/2020. EXAM: MRI HEAD WITHOUT AND WITH CONTRAST TECHNIQUE: Multiplanar, multiecho pulse sequences of the brain and surrounding structures were obtained without and with intravenous contrast. CONTRAST:  50mL GADAVIST GADOBUTROL 1 MMOL/ML IV SOLN COMPARISON:  MRI 06/01/2020 FINDINGS: Brain: No acute infarction, hemorrhage, hydrocephalus, extra-axial collection or mass lesion. Minimal T2/FLAIR hyperintensities, which are nonspecific but likely represent age-appropriate chronic microvascular ischemic disease. No abnormal enhancement. Vascular: Major arterial flow voids are maintained at the skull base. Skull and upper cervical spine: Normal marrow signal. Sinuses/Orbits: Sinuses are clear.  Unremarkable orbits. Other: No mastoid effusions. IMPRESSION: No acute abnormality. No evidence of intracranial metastatic disease. Electronically Signed   By: 08/02/2020  Adah Salvage MD   On: 10/25/2020 13:03     Assessment and plan- Patient is a 64 y.o. 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   She is here for on treatment assessment prior to cycle 3-day 1 of carboplatin and gemcitabine  Counts okay to proceed with cycle 3-day 1 of carboplatin gemcitabine today.  She will proceed with cycle 3-day 8 chemotherapy next week.  Repeat CT chest abdomen pelvis with contrast and bone scan or PET scan in 2.5 weeks time.  I will see her back in 3 weeks for cycle 4-day 1 of carboplatin and gemcitabine  Chemo associated fatigue- stable continue to monitor  Neoplasm related pain under right arm: Clinically there is no evidence of palpable mass that would explain the pain.  I  suspect there is a component of neuropathic pain.  Of asked her to continue as needed Dilaudid and I will switch her from gabapentin to Lyrica 75 mg twice daily.  Chemo-induced anemia: Stable around 10.  Continue to monitor  Right upper extremity lymphedema: Patient is unable to wear her lymphedema sleeve as it bothers her right axilla.  I have asked her to continue right upper extremity exercises to reduce swelling and fluid accumulation.   Visit Diagnosis 1. Encounter for antineoplastic chemotherapy   2. Malignant neoplasm of overlapping sites of right breast in female, estrogen receptor negative ([place])   3. Chemotherapy-induced fatigue   4. Anemia due to antineoplastic chemotherapy      Dr. Randa Evens, MD, MPH [facility] at [facility] [phone] 11/23/2020 9:41 AM

## 2020-11-24 ENCOUNTER — Ambulatory Visit: Payer: PPO | Admitting: Radiation Oncology

## 2020-11-28 ENCOUNTER — Encounter: Payer: Self-pay | Admitting: Nurse Practitioner

## 2020-11-28 ENCOUNTER — Encounter: Payer: Self-pay | Admitting: Oncology

## 2020-11-29 ENCOUNTER — Telehealth: Payer: Self-pay | Admitting: *Deleted

## 2020-11-29 ENCOUNTER — Inpatient Hospital Stay: Payer: Medicare Other | Attending: Oncology | Admitting: Nurse Practitioner

## 2020-11-29 VITALS — BP 131/70 | HR 109 | Temp 97.6°F | Resp 18

## 2020-11-29 DIAGNOSIS — R5383 Other fatigue: Secondary | ICD-10-CM | POA: Diagnosis not present

## 2020-11-29 DIAGNOSIS — C50811 Malignant neoplasm of overlapping sites of right female breast: Secondary | ICD-10-CM | POA: Diagnosis not present

## 2020-11-29 DIAGNOSIS — Z79899 Other long term (current) drug therapy: Secondary | ICD-10-CM | POA: Diagnosis not present

## 2020-11-29 DIAGNOSIS — G893 Neoplasm related pain (acute) (chronic): Secondary | ICD-10-CM | POA: Diagnosis not present

## 2020-11-29 DIAGNOSIS — Z803 Family history of malignant neoplasm of breast: Secondary | ICD-10-CM | POA: Insufficient documentation

## 2020-11-29 DIAGNOSIS — K769 Liver disease, unspecified: Secondary | ICD-10-CM | POA: Diagnosis not present

## 2020-11-29 DIAGNOSIS — C50911 Malignant neoplasm of unspecified site of right female breast: Secondary | ICD-10-CM | POA: Diagnosis not present

## 2020-11-29 DIAGNOSIS — Z171 Estrogen receptor negative status [ER-]: Secondary | ICD-10-CM | POA: Insufficient documentation

## 2020-11-29 DIAGNOSIS — Z5111 Encounter for antineoplastic chemotherapy: Secondary | ICD-10-CM | POA: Diagnosis not present

## 2020-11-29 DIAGNOSIS — Z5112 Encounter for antineoplastic immunotherapy: Secondary | ICD-10-CM | POA: Diagnosis not present

## 2020-11-29 DIAGNOSIS — R5381 Other malaise: Secondary | ICD-10-CM | POA: Diagnosis not present

## 2020-11-29 DIAGNOSIS — Z5189 Encounter for other specified aftercare: Secondary | ICD-10-CM | POA: Diagnosis not present

## 2020-11-29 NOTE — Telephone Encounter (Signed)
Sounds good. Zaria & Rita Ohara- could you enter order for u/s, get scheduled, and have her see me after? Sherry- fyi. Thanks!

## 2020-11-29 NOTE — Telephone Encounter (Signed)
Debra Little - please clarify what ultrasound would you like to order

## 2020-11-29 NOTE — Progress Notes (Signed)
Symptom Management [place]  [phone]) [phone] [phone]) [phone]  Patient Care Team: Tonia Ghent, MD as PCP - General (Family Medicine) Bary Castilla, Forest Gleason, MD (General Surgery) Modesto Charon, MD (Family Medicine) Nori Riis, PA-C as Physician Assistant (Urology) Teodoro Spray, MD as Consulting Physician (Cardiology) Sindy Guadeloupe, MD as Consulting Physician (Hematology and Oncology) Noreene Filbert, MD as Referring Physician (Radiation Oncology)   Name of the patient: Debra Little  [id number]  12/25/1955   Date of visit: 11/29/20  Chief complaint/ Reason for visit- Chest pain & abdominal pain  Heme/Onc history:  Oncology History  Breast cancer ([place])  07/30/2019 Initial Diagnosis   Breast cancer ([place])   08/06/2019 Cancer Staging   Staging form: Breast, AJCC 8th Edition - Clinical stage from 08/06/2019: Stage IIIB (cT2, cN1, cM0, G3, ER-, PR-, HER2-) - Signed by Sindy Guadeloupe, MD on 08/07/2019   08/11/2019 - 03/02/2020 Chemotherapy   The patient had dexamethasone (DECADRON) 4 MG tablet, 1 of 1 cycle, Start date: 08/07/2019, End date: 04/20/2020 DOXOrubicin (ADRIAMYCIN) chemo injection 106 mg, 60 mg/m2 = 106 mg, Intravenous,  Once, 4 of 4 cycles Administration: 106 mg (08/11/2019), 106 mg (09/01/2019), 100 mg (09/22/2019), 100 mg (12/02/2019) palonosetron (ALOXI) injection 0.25 mg, 0.25 mg, Intravenous,  Once, 7 of 7 cycles Administration: 0.25 mg (08/11/2019), 0.25 mg (12/16/2019), 0.25 mg (01/06/2020), 0.25 mg (09/01/2019), 0.25 mg (12/23/2019), 0.25 mg (12/30/2019), 0.25 mg (09/22/2019), 0.25 mg (12/02/2019), 0.25 mg (01/20/2020), 0.25 mg (01/27/2020) pegfilgrastim-cbqv (UDENYCA) injection 6 mg, 6 mg, Subcutaneous, Once, 4 of 4 cycles Administration: 6 mg (08/12/2019), 6 mg (09/02/2019), 6 mg (09/23/2019), 6 mg (12/03/2019) cyclophosphamide (CYTOXAN) 1,060 mg in sodium chloride 0.9 % 250 mL chemo infusion, 600 mg/m2 = 1,060 mg, Intravenous,   Once, 4 of 4 cycles Administration: 1,000 mg (09/01/2019), 1,000 mg (09/22/2019), 1,000 mg (12/02/2019) PACLitaxel (TAXOL) 138 mg in sodium chloride 0.9 % 250 mL chemo infusion (</= 29m/m2), 80 mg/m2 = 138 mg, Intravenous,  Once, 4 of 4 cycles Dose modification: 65 mg/m2 (original dose 80 mg/m2, Cycle 6, Reason: Provider Judgment) Administration: 138 mg (12/16/2019), 138 mg (12/23/2019), 114 mg (01/13/2020), 138 mg (12/30/2019), 114 mg (02/03/2020), 114 mg (01/20/2020), 114 mg (01/27/2020), 114 mg (02/10/2020), 114 mg (02/17/2020), 114 mg (02/24/2020), 114 mg (03/02/2020) pembrolizumab (KEYTRUDA) 200 mg in sodium chloride 0.9 % 50 mL chemo infusion, 200 mg (100 % of original dose 200 mg), Intravenous, Once, 3 of 3 cycles Dose modification: 200 mg (original dose 200 mg, Cycle 1, Reason: Provider Judgment) Administration: 200 mg (08/11/2019), 200 mg (09/01/2019), 200 mg (09/22/2019) fosaprepitant (EMEND) 150 mg, dexamethasone (DECADRON) 12 mg in sodium chloride 0.9 % 145 mL IVPB, , Intravenous,  Once, 4 of 4 cycles Administration:  (08/11/2019),  (09/01/2019),  (09/22/2019),  (12/02/2019)  for chemotherapy treatment.    08/26/2019 Genetic Testing   Negative genetic testing. VUS in CHEK2 called c.556A>C identified on the [facility] Common Hereditary Cancers Panel. The Common Hereditary Cancers Panel offered by [facility] includes sequencing and/or deletion duplication testing of the following 48 genes: APC, ATM, AXIN2, BARD1, BMPR1A, BRCA1, BRCA2, BRIP1, CDH1, CDKN2A (p14ARF), CDKN2A (p16INK4a), CKD4, CHEK2, CTNNA1, DICER1, EPCAM (Deletion/duplication testing only), GREM1 (promoter region deletion/duplication testing only), KIT, MEN1, MLH1, MSH2, MSH3, MSH6, MUTYH, NBN, NF1, NHTL1, PALB2, PDGFRA, PMS2, POLD1, POLE, PTEN, RAD50, RAD51C, RAD51D, RNF43, SDHB, SDHC, SDHD, SMAD4, SMARCA4. STK11, TP53, TSC1, TSC2, and VHL.  The following genes were evaluated for sequence changes only: SDHA and HOXB13 c.251G>A variant only.  The report date is  08/26/2019.    10/21/2019 Cancer Staging   Staging form: Breast, AJCC 8th Edition - Pathologic stage from 10/21/2019: Stage IIIC (pT2, pN2a, cM0, G3, ER-, PR-, HER2-) - Signed by Sindy Guadeloupe, MD on 10/27/2019   10/12/2020 -  Chemotherapy   The patient had dexamethasone (DECADRON) 4 MG tablet, 8 mg, Oral, Daily, 1 of 1 cycle, Start date: 10/12/2020, End date: -- palonosetron (ALOXI) injection 0.25 mg, 0.25 mg, Intravenous,  Once, 3 of 4 cycles Administration: 0.25 mg (10/12/2020), 0.25 mg (10/19/2020), 0.25 mg (11/09/2020), 0.25 mg (11/02/2020), 0.25 mg (11/23/2020) pegfilgrastim (NEULASTA ONPRO KIT) injection 6 mg, 6 mg, Subcutaneous, Once, 3 of 4 cycles Administration: 6 mg (10/19/2020), 6 mg (11/09/2020) CARBOplatin (PARAPLATIN) 210 mg in sodium chloride 0.9 % 250 mL chemo infusion, 210 mg (100 % of original dose 209.6 mg), Intravenous,  Once, 3 of 4 cycles Dose modification:   (original dose 209.6 mg, Cycle 1) Administration: 210 mg (10/12/2020), 210 mg (10/19/2020), 210 mg (11/09/2020), 210 mg (11/02/2020), 210 mg (11/23/2020) gemcitabine (GEMZAR) 1,800 mg in sodium chloride 0.9 % 250 mL chemo infusion, 1,862 mg, Intravenous,  Once, 3 of 4 cycles Administration: 1,800 mg (10/12/2020), 1,800 mg (10/19/2020), 1,800 mg (11/09/2020), 1,800 mg (11/02/2020), 1,800 mg (11/23/2020)  for chemotherapy treatment.      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  She has had ongoing pain in the chest but feels the abdominal pain is new.  Says at times she feels like she can hear her stomach/chest moving.  Most commonly occurs at night.  She tried taking some simethicone today and is unsure of effect.  She has not started Lyrica for neuropathic pain of the right arm and chest  wall.  Has not taken Dilaudid for malignancy pain. Feels at baseline otherwise. Is accompanied by her now husband today.   Review of systems- Review of Systems  Constitutional: Negative for chills, fever, malaise/fatigue and weight loss.  HENT: Negative for hearing loss, nosebleeds, sore throat and tinnitus.   Eyes: Negative for blurred vision and double vision.  Respiratory: Negative for cough, hemoptysis, shortness of breath and wheezing.   Cardiovascular: Negative for chest pain, palpitations and leg swelling.  Gastrointestinal: Positive for abdominal pain. Negative for blood in stool, constipation, diarrhea, melena, nausea and vomiting.  Genitourinary: Negative for dysuria and urgency.  Musculoskeletal: Positive for myalgias. Negative for back pain, falls and joint pain.       R arm swelling and tightness  Skin: Negative for itching and rash.  Neurological: Negative for dizziness, tingling, sensory change, loss of consciousness, weakness and headaches.  Endo/Heme/Allergies: Negative for environmental allergies. Does not bruise/bleed easily.  Psychiatric/Behavioral: Negative for depression. The patient is nervous/anxious. The patient does not have insomnia.       Allergies  Allergen Reactions  . Amitiza [Lubiprostone] Other (See Comments)    Overly effective at higher dose  . Bentyl [Dicyclomine Hcl]     Lack of effect  . Cortisone Other (See Comments)    flush  . Mobic [Meloxicam]     Palpitations.  But can tolerate aleve  . Nitrofurantoin Monohyd Macro   . Sulfonamide Derivatives Nausea Only  . Zoloft [Sertraline Hcl] Diarrhea and Nausea Only  . Buspar [Buspirone] Palpitations  . Celecoxib Rash    REACTION: unspecified  .  Doxycycline Palpitations    chest pain  . Penicillins Rash    Past Medical History:  Diagnosis Date  . Anemia    d/t chemo - last Hgb 10.8  . Anxiety    sees Dr. [name]  . BRCA negative    [facility] panel neg except CHEK2 VUS  . Breast cancer  ([place]) Feb 04, 2019  . Cyst of breast    per [name]  . Dysrhythmia    Hx of palpitations  . Family history of breast cancer   . Family history of colon cancer   . Family history of leukemia   . Family history of ovarian cancer   . Fibromyalgia   . GERD (gastroesophageal reflux disease)   . Heart murmur   . Hemorrhoids   . Herpes, genital 04/2015   confirmed with HSV 2 IgG  . Hiatal hernia   . Hypercholesterolemia    Dr. Kyra Searles  . Hyperlipidemia   . IBS (irritable bowel syndrome)    constipation predominant  . IC (interstitial cystitis)    per [facility]  . Mild depression ([place])   . MVP (mitral valve prolapse)    [facility] cards eval 2011-02-04  . Osteopenia    2010/2017, DEXA at [facility];spine and fem neck  . PONV (postoperative nausea and vomiting)   . Vitamin D deficiency    history of    Past Surgical History:  Procedure Laterality Date  . ABDOMINAL HYSTERECTOMY    . APPENDECTOMY    . BLADDER SURGERY     1980's  . BREAST EXCISIONAL BIOPSY Right    benign  . BREAST EXCISIONAL BIOPSY Bilateral    benign  . COLONOSCOPY  09/2014   Dr. [name]  . COLONOSCOPY  05-Feb-2008   at [facility] 1 POLYP (BENIGN)  . excision of breast cysts     hx of multiple cyst aspirations  . EXPLORATORY LAPAROTOMY    . MASTECTOMY MODIFIED RADICAL Bilateral 10/09/2019   Procedure: RIGHT MASTECTOMY MODIFIED RADICAL AND LEFT TOTAL MASTECTOMY;  Surgeon: Rolm Bookbinder, MD;  Location: [place];  Service: General;  Laterality: Bilateral;  . PORTA CATH INSERTION N/A 08/06/2019   Procedure: PORTA CATH INSERTION;  Surgeon: Katha Cabal, MD;  Location: [facility] [facility];  Service: Cardiovascular;  Laterality: N/A;  . PORTACATH PLACEMENT Left 08/05/2019   Procedure: INSERTION PORT-A-CATH, Attempted;  Surgeon: Jules Husbands, MD;  Location: [facility];  Service: General;  Laterality: Left;  . TUBAL LIGATION      Social History   Socioeconomic History  . Marital status: Divorced    Spouse name: Not on file  .  Number of children: 1  . Years of education: Not on file  . Highest education level: Not on file  Occupational History  . Occupation: [employer]  Tobacco Use  . Smoking status: Never Smoker  . Smokeless tobacco: Never Used  Vaping Use  . Vaping Use: Never used  Substance and Sexual Activity  . Alcohol use: No    Alcohol/week: 0.0 standard drinks  . Drug use: No  . Sexual activity: Yes    Birth control/protection: Surgical    Comment: Hysterectomy  Other Topics Concern  . Not on file  Social History Narrative   Married in 02/04/97, divorced as of Feb 04, 2017, 1 son from previous relationship   Brother with MVA at 14, in rest home for many years as of 2018/02/04- died of covid recently   Social Determinants of Radio broadcast assistant Strain: [place]   .  Difficulty of Paying Living Expenses: Not hard at all  Food Insecurity: No Food Insecurity  . Worried About Charity fundraiser in the Last Year: Never true  . Ran Out of Food in the Last Year: Never true  Transportation Needs: No Transportation Needs  . Lack of Transportation (Medical): No  . Lack of Transportation (Non-Medical): No  Physical Activity: Inactive  . Days of Exercise per Week: 0 days  . Minutes of Exercise per Session: 0 min  Stress: No Stress Concern Present  . Feeling of Stress : Not at all  Social Connections: Not on file  Intimate Partner Violence: Not At Risk  . Fear of Current or Ex-Partner: No  . Emotionally Abused: No  . Physically Abused: No  . Sexually Abused: No    Family History  Problem Relation Age of Onset  . Breast cancer Mother 23  . Hypertension Mother   . Colon cancer Father 63  . Hyperlipidemia Father   . Ovarian cancer Maternal Aunt 80  . Breast cancer Cousin   . Gallbladder disease Paternal Grandmother   . Liver cancer Cousin 70       Malignant  . Cancer Maternal Uncle        unsure type     Current Outpatient Medications:  .  acetaminophen (TYLENOL) 650 MG CR tablet, Take 650 mg by  mouth every 8 (eight) hours as needed for pain., Disp: , Rfl:  .  ascorbic acid (VITAMIN C) 500 MG tablet, Take 500 mg by mouth daily., Disp: , Rfl:  .  atorvastatin (LIPITOR) 40 MG tablet, Take 40 mg by mouth at bedtime. , Disp: , Rfl: 1 .  Calcium Carbonate-Vit D-Min (CALCIUM 1200 PO), Take 4 tablets by mouth daily., Disp: , Rfl:  .  cholecalciferol (VITAMIN D3) 25 MCG (1000 UNIT) tablet, Take 1 tablet (1,000 Units total) by mouth daily., Disp: , Rfl:  .  clonazePAM (KLONOPIN) 0.5 MG tablet, Take 0.5 mg by mouth 2 (two) times daily. , Disp: , Rfl:  .  dexamethasone (DECADRON) 4 MG tablet, Take 2 tablets (8 mg total) by mouth daily. Start the day after chemotherapy for 2 days. Take with food., Disp: 30 tablet, Rfl: 1 .  Dexlansoprazole 30 MG capsule, Take 1 capsule (30 mg total) by mouth daily., Disp: 30 capsule, Rfl: 6 .  DULoxetine (CYMBALTA) 60 MG capsule, Take 60 mg by mouth daily. , Disp: , Rfl:  .  furosemide (LASIX) 20 MG tablet, Take 1 tablet (20 mg total) by mouth every other day., Disp: , Rfl:  .  HYDROmorphone HCl (DILAUDID) 1 MG/ML LIQD, Take 0.5 mLs (0.5 mg total) by mouth every 4 (four) hours as needed for severe pain., Disp: 90 mL, Rfl: 0 .  lactulose (CHRONULAC) 10 GM/15ML solution, Take 45 mLs (30 g total) by mouth 2 (two) times daily., Disp: 236 mL, Rfl: 1 .  lidocaine (LIDODERM) 5 %, Place 1 patch onto the skin daily. Remove & Discard patch within 12 hours or as directed by MD (Patient not taking: Reported on 11/23/2020), Disp: 30 patch, Rfl: 0 .  lidocaine-prilocaine (EMLA) cream, Apply to affected area once, Disp: 30 g, Rfl: 3 .  loratadine (CLARITIN) 10 MG tablet, Take 1 tablet (10 mg total) by mouth daily., Disp: , Rfl:  .  NON FORMULARY, Take 5 mLs by mouth 4 (four) times daily as needed. Swish and swallow 5-10 ml of Medicated Mouthwash 4 x per day as needed, Disp: , Rfl:  .  OLANZapine (ZYPREXA) 10 MG tablet, Take 1 tablet (10 mg total) by mouth at bedtime. For nausea or  vomiting (Patient not taking: No sig reported), Disp: 30 tablet, Rfl: 0 .  ondansetron (ZOFRAN) 8 MG tablet, Take 1 tablet (8 mg total) by mouth 2 (two) times daily as needed for refractory nausea / vomiting. Start on day 3 after chemotherapy. (Patient not taking: No sig reported), Disp: 30 tablet, Rfl: 1 .  Plecanatide (TRULANCE) 3 MG TABS, Take 3 mg by mouth daily., Disp: 30 tablet, Rfl: 11 .  polyethylene glycol (MIRALAX / GLYCOLAX) 17 g packet, Take 17 g by mouth daily., Disp: , Rfl:  .  pregabalin (LYRICA) 75 MG capsule, Take 1 capsule (75 mg total) by mouth 2 (two) times daily., Disp: 60 capsule, Rfl: 2 .  prochlorperazine (COMPAZINE) 10 MG tablet, Take 1 tablet (10 mg total) by mouth every 6 (six) hours as needed (Nausea or vomiting). (Patient not taking: No sig reported), Disp: 30 tablet, Rfl: 1 .  sennosides-docusate sodium (SENOKOT-S) 8.6-50 MG tablet, Take 1-2 tablets by mouth at bedtime.  (Patient not taking: Reported on 11/23/2020), Disp: , Rfl:  .  sucralfate (CARAFATE) 1 g tablet, Take 1 g by mouth 3 (three) times daily.  (Patient not taking: No sig reported), Disp: , Rfl:  .  triamcinolone (KENALOG) 0.1 %, Apply 1 application topically 2 (two) times daily as needed. For itching, Disp: 80 g, Rfl: 0 .  valACYclovir (VALTREX) 500 MG tablet, Take 1 tablet (500 mg total) by mouth daily., Disp: 90 tablet, Rfl: 3 .  vitamin B-12 (CYANOCOBALAMIN) 500 MCG tablet, Take 500 mcg by mouth daily., Disp: , Rfl:   Physical exam:  Vitals:   11/29/20 1445  BP: 131/70  Pulse: (!) 109  Resp: 18  Temp: 97.6 F (36.4 C)  TempSrc: Tympanic  SpO2: 99%   Physical Exam Constitutional:      General: She is not in acute distress. HENT:     Head: Normocephalic.  Pulmonary:     Effort: Pulmonary effort is normal. No respiratory distress.     Breath sounds: No wheezing.  Abdominal:     General: There is no distension.     Palpations: Abdomen is soft. There is no mass.     Tenderness: There is no  abdominal tenderness. There is no guarding or rebound.  Musculoskeletal:        General: Deformity (post surgical changes of right chest) present. No swelling.  Skin:    General: Skin is warm and dry.  Neurological:     Mental Status: She is alert.  Psychiatric:        Mood and Affect: Mood normal.        Behavior: Behavior normal.      CMP Latest Ref Rng & Units 11/23/2020  Glucose 70 - 99 mg/dL 128(H)  BUN 8 - 23 mg/dL 13  Creatinine 0.44 - 1.00 mg/dL 0.77  Sodium 135 - 145 mmol/L 136  Potassium 3.5 - 5.1 mmol/L 3.8  Chloride 98 - 111 mmol/L 99  CO2 22 - 32 mmol/L 26  Calcium 8.9 - 10.3 mg/dL 9.0  Total Protein 6.5 - 8.1 g/dL 6.9  Total Bilirubin 0.3 - 1.2 mg/dL 0.4  Alkaline Phos 38 - 126 U/L 99  AST 15 - 41 U/L 36  ALT 0 - 44 U/L 44   CBC Latest Ref Rng & Units 11/23/2020  WBC 4.0 - 10.5 K/uL 12.2(H)  Hemoglobin 12.0 - 15.0 g/dL 10.7(L)  Hematocrit  36.0 - 46.0 % 33.6(L)  Platelets 150 - 400 K/uL 321    No images are attached to the encounter.  No results found.  Assessment and plan- Patient is a 65 y.o. female diagnosed with recurrent triple negative metaplastic right breast cancer currently receiving carboplatin and gemcitabine presents to symptom management clinic for complaints of right chest wall and upper abdominal pain.  Etiology of pain unclear-question postsurgical vs malignancy vs medication side effect vs gi.  I reviewed her 11/23/2020 labs which showed normal LFTs.  No obvious ascites.  Known liver lesion based on November 2021 PET scan. Pain is not reproducible and I suspect some component of neuropathic pain.  She has not yet started Lyrica which I encouraged her to try.  Also has not started Dilaudid which we discussed use today.  May also benefit from lymphedema sleeve to reduce swelling and fluid accumulation though generally symptoms seem mild today.  Encouraged use of medications as prescribed and to monitor pain.  If pain does not improve or worsens in  the interim, I asked that she contact me for reevaluation.  Otherwise follow-up with Dr. Ebony Hail scheduled and plan for PET scan on 12/09/2020.   Visit Diagnosis No diagnosis found.  Patient expressed understanding and was in agreement with this plan. She also understands that She can call clinic at any time with any questions, concerns, or complaints.   Thank you for allowing me to participate in the care of this very pleasant patient.   Beckey Rutter, DNP, AGNP-C [place] [place]  CC:

## 2020-11-29 NOTE — Telephone Encounter (Signed)
Spoke to Dr. Smith Robert. Let me see the patient and then we can decide on imaging. Thanks!

## 2020-11-29 NOTE — Telephone Encounter (Signed)
Debra Little- [place] chest wall may be a good start and then you can see her?

## 2020-11-29 NOTE — Telephone Encounter (Signed)
Patient called re[porting that she has swelling (fluid) on her cancer side and that it is painful and would like something to be done for it. Please advise

## 2020-12-01 ENCOUNTER — Inpatient Hospital Stay: Payer: Medicare Other

## 2020-12-01 VITALS — BP 107/53 | HR 77 | Temp 96.4°F | Resp 16 | Wt 162.0 lb

## 2020-12-01 DIAGNOSIS — C50811 Malignant neoplasm of overlapping sites of right female breast: Secondary | ICD-10-CM | POA: Diagnosis not present

## 2020-12-01 DIAGNOSIS — R5383 Other fatigue: Secondary | ICD-10-CM | POA: Diagnosis not present

## 2020-12-01 DIAGNOSIS — Z171 Estrogen receptor negative status [ER-]: Secondary | ICD-10-CM

## 2020-12-01 DIAGNOSIS — Z803 Family history of malignant neoplasm of breast: Secondary | ICD-10-CM | POA: Diagnosis not present

## 2020-12-01 DIAGNOSIS — G893 Neoplasm related pain (acute) (chronic): Secondary | ICD-10-CM | POA: Diagnosis not present

## 2020-12-01 DIAGNOSIS — R5381 Other malaise: Secondary | ICD-10-CM | POA: Diagnosis not present

## 2020-12-01 DIAGNOSIS — C50511 Malignant neoplasm of lower-outer quadrant of right female breast: Secondary | ICD-10-CM

## 2020-12-01 DIAGNOSIS — Z5112 Encounter for antineoplastic immunotherapy: Secondary | ICD-10-CM | POA: Diagnosis not present

## 2020-12-01 DIAGNOSIS — Z5189 Encounter for other specified aftercare: Secondary | ICD-10-CM | POA: Diagnosis not present

## 2020-12-01 DIAGNOSIS — Z5111 Encounter for antineoplastic chemotherapy: Secondary | ICD-10-CM | POA: Diagnosis not present

## 2020-12-01 DIAGNOSIS — K769 Liver disease, unspecified: Secondary | ICD-10-CM | POA: Diagnosis not present

## 2020-12-01 DIAGNOSIS — Z79899 Other long term (current) drug therapy: Secondary | ICD-10-CM | POA: Diagnosis not present

## 2020-12-01 LAB — CBC WITH DIFFERENTIAL/PLATELET
Abs Immature Granulocytes: 0.08 10*3/uL — ABNORMAL HIGH (ref 0.00–0.07)
Basophils Absolute: 0 10*3/uL (ref 0.0–0.1)
Basophils Relative: 0 %
Eosinophils Absolute: 0.2 10*3/uL (ref 0.0–0.5)
Eosinophils Relative: 3 %
HCT: 29.5 % — ABNORMAL LOW (ref 36.0–46.0)
Hemoglobin: 9.8 g/dL — ABNORMAL LOW (ref 12.0–15.0)
Immature Granulocytes: 1 %
Lymphocytes Relative: 11 %
Lymphs Abs: 0.9 10*3/uL (ref 0.7–4.0)
MCH: 29.6 pg (ref 26.0–34.0)
MCHC: 33.2 g/dL (ref 30.0–36.0)
MCV: 89.1 fL (ref 80.0–100.0)
Monocytes Absolute: 1.6 10*3/uL — ABNORMAL HIGH (ref 0.1–1.0)
Monocytes Relative: 20 %
Neutro Abs: 5.4 10*3/uL (ref 1.7–7.7)
Neutrophils Relative %: 65 %
Platelets: 188 10*3/uL (ref 150–400)
RBC: 3.31 MIL/uL — ABNORMAL LOW (ref 3.87–5.11)
RDW: 17.8 % — ABNORMAL HIGH (ref 11.5–15.5)
WBC: 8.2 10*3/uL (ref 4.0–10.5)
nRBC: 0 % (ref 0.0–0.2)

## 2020-12-01 LAB — COMPREHENSIVE METABOLIC PANEL
ALT: 38 U/L (ref 0–44)
AST: 28 U/L (ref 15–41)
Albumin: 3.4 g/dL — ABNORMAL LOW (ref 3.5–5.0)
Alkaline Phosphatase: 78 U/L (ref 38–126)
Anion gap: 9 (ref 5–15)
BUN: 13 mg/dL (ref 8–23)
CO2: 26 mmol/L (ref 22–32)
Calcium: 8.6 mg/dL — ABNORMAL LOW (ref 8.9–10.3)
Chloride: 100 mmol/L (ref 98–111)
Creatinine, Ser: 0.67 mg/dL (ref 0.44–1.00)
GFR, Estimated: >60 mL/min (ref >60)
Glucose, Bld: 107 mg/dL — ABNORMAL HIGH (ref 70–99)
Potassium: 3.8 mmol/L (ref 3.5–5.1)
Sodium: 135 mmol/L (ref 135–145)
Total Bilirubin: 0.6 mg/dL (ref 0.3–1.2)
Total Protein: 6.5 g/dL (ref 6.5–8.1)

## 2020-12-01 MED ORDER — PALONOSETRON HCL INJECTION 0.25 MG/5ML
0.2500 mg | Freq: Once | INTRAVENOUS | Status: AC
  Administered 2020-12-01: 0.25 mg via INTRAVENOUS
  Filled 2020-12-01: qty 5

## 2020-12-01 MED ORDER — SODIUM CHLORIDE 0.9 % IV SOLN: 4.0000 mg | Freq: Once | INTRAVENOUS | Status: DC

## 2020-12-01 MED ORDER — SODIUM CHLORIDE 0.9 % IV SOLN
1800.0000 mg | Freq: Once | INTRAVENOUS | Status: AC
  Administered 2020-12-01: 1800 mg via INTRAVENOUS
  Filled 2020-12-01: qty 26.3

## 2020-12-01 MED ORDER — PEGFILGRASTIM 6 MG/0.6ML ~~LOC~~ PSKT
6.0000 mg | PREFILLED_SYRINGE | Freq: Once | SUBCUTANEOUS | Status: AC
  Administered 2020-12-01: 6 mg via SUBCUTANEOUS
  Filled 2020-12-01: qty 0.6

## 2020-12-01 MED ORDER — HEPARIN SOD (PORK) LOCK FLUSH 100 UNIT/ML IV SOLN
INTRAVENOUS | Status: AC
  Filled 2020-12-01: qty 5

## 2020-12-01 MED ORDER — DIPHENHYDRAMINE HCL 50 MG/ML IJ SOLN
12.5000 mg | Freq: Once | INTRAMUSCULAR | Status: AC
  Administered 2020-12-01: 12.5 mg via INTRAVENOUS
  Filled 2020-12-01: qty 1

## 2020-12-01 MED ORDER — SODIUM CHLORIDE 0.9 % IV SOLN
210.0000 mg | Freq: Once | INTRAVENOUS | Status: AC
  Administered 2020-12-01: 210 mg via INTRAVENOUS
  Filled 2020-12-01: qty 21

## 2020-12-01 MED ORDER — HEPARIN SOD (PORK) LOCK FLUSH 100 UNIT/ML IV SOLN
500.0000 [IU] | Freq: Once | INTRAVENOUS | Status: AC | PRN
  Administered 2020-12-01: 500 [IU]
  Filled 2020-12-01: qty 5

## 2020-12-01 MED ORDER — DEXAMETHASONE SODIUM PHOSPHATE 10 MG/ML IJ SOLN
4.0000 mg | Freq: Once | INTRAMUSCULAR | Status: AC
  Administered 2020-12-01: 4 mg via INTRAVENOUS
  Filled 2020-12-01: qty 1

## 2020-12-01 MED ORDER — SODIUM CHLORIDE 0.9 % IV SOLN
Freq: Once | INTRAVENOUS | Status: AC
  Filled 2020-12-01: qty 250

## 2020-12-01 NOTE — Progress Notes (Signed)
Patient tolerated infusion well. Discharged home.  

## 2020-12-01 NOTE — Progress Notes (Signed)
[facility] [place]  [phone] [phone]  Patient Care Team: Tonia Ghent, MD as PCP - General (Family Medicine) Bary Castilla, Forest Gleason, MD (General Surgery) Modesto Charon, MD (Family Medicine) Nori Riis, PA-C as Physician Assistant (Urology) Teodoro Spray, MD as Consulting Physician (Cardiology) Sindy Guadeloupe, MD as Consulting Physician (Hematology and Oncology) Noreene Filbert, MD as Referring Physician (Radiation Oncology)   Name of the patient: Debra Little  [id number]  October 25, 1956   Date of visit: 10/15/20  Diagnosis- recurrent triple negative breast cancer  Chief complaint/ Reason for visit- constipation  Heme/Onc history:  Oncology History  Breast cancer ([place])  07/30/2019 Initial Diagnosis   Breast cancer ([place])   08/06/2019 Cancer Staging   Staging form: Breast, AJCC 8th Edition - Clinical stage from 08/06/2019: Stage IIIB (cT2, cN1, cM0, G3, ER-, PR-, HER2-) - Signed by Sindy Guadeloupe, MD on 08/07/2019   08/11/2019 - 03/02/2020 Chemotherapy   The patient had dexamethasone (DECADRON) 4 MG tablet, 1 of 1 cycle, Start date: 08/07/2019, End date: 04/20/2020 DOXOrubicin (ADRIAMYCIN) chemo injection 106 mg, 60 mg/m2 = 106 mg, Intravenous,  Once, 4 of 4 cycles Administration: 106 mg (08/11/2019), 106 mg (09/01/2019), 100 mg (09/22/2019), 100 mg (12/02/2019) palonosetron (ALOXI) injection 0.25 mg, 0.25 mg, Intravenous,  Once, 7 of 7 cycles Administration: 0.25 mg (08/11/2019), 0.25 mg (12/16/2019), 0.25 mg (01/06/2020), 0.25 mg (09/01/2019), 0.25 mg (12/23/2019), 0.25 mg (12/30/2019), 0.25 mg (09/22/2019), 0.25 mg (12/02/2019), 0.25 mg (01/20/2020), 0.25 mg (01/27/2020) pegfilgrastim-cbqv (UDENYCA) injection 6 mg, 6 mg, Subcutaneous, Once, 4 of 4 cycles Administration: 6 mg (08/12/2019), 6 mg (09/02/2019), 6 mg (09/23/2019), 6 mg (12/03/2019) cyclophosphamide (CYTOXAN) 1,060 mg in sodium chloride 0.9 % 250 mL chemo infusion,  600 mg/m2 = 1,060 mg, Intravenous,  Once, 4 of 4 cycles Administration: 1,000 mg (09/01/2019), 1,000 mg (09/22/2019), 1,000 mg (12/02/2019) PACLitaxel (TAXOL) 138 mg in sodium chloride 0.9 % 250 mL chemo infusion (</= 81m/m2), 80 mg/m2 = 138 mg, Intravenous,  Once, 4 of 4 cycles Dose modification: 65 mg/m2 (original dose 80 mg/m2, Cycle 6, Reason: Provider Judgment) Administration: 138 mg (12/16/2019), 138 mg (12/23/2019), 114 mg (01/13/2020), 138 mg (12/30/2019), 114 mg (02/03/2020), 114 mg (01/20/2020), 114 mg (01/27/2020), 114 mg (02/10/2020), 114 mg (02/17/2020), 114 mg (02/24/2020), 114 mg (03/02/2020) pembrolizumab (KEYTRUDA) 200 mg in sodium chloride 0.9 % 50 mL chemo infusion, 200 mg (100 % of original dose 200 mg), Intravenous, Once, 3 of 3 cycles Dose modification: 200 mg (original dose 200 mg, Cycle 1, Reason: Provider Judgment) Administration: 200 mg (08/11/2019), 200 mg (09/01/2019), 200 mg (09/22/2019) fosaprepitant (EMEND) 150 mg, dexamethasone (DECADRON) 12 mg in sodium chloride 0.9 % 145 mL IVPB, , Intravenous,  Once, 4 of 4 cycles Administration:  (08/11/2019),  (09/01/2019),  (09/22/2019),  (12/02/2019)  for chemotherapy treatment.    08/26/2019 Genetic Testing   Negative genetic testing. VUS in CHEK2 called c.556A>C identified on the [employer] Common Hereditary Cancers Panel. The Common Hereditary Cancers Panel offered by [facility] includes sequencing and/or deletion duplication testing of the following 48 genes: APC, ATM, AXIN2, BARD1, BMPR1A, BRCA1, BRCA2, BRIP1, CDH1, CDKN2A (p14ARF), CDKN2A (p16INK4a), CKD4, CHEK2, CTNNA1, DICER1, EPCAM (Deletion/duplication testing only), GREM1 (promoter region deletion/duplication testing only), KIT, MEN1, MLH1, MSH2, MSH3, MSH6, MUTYH, NBN, NF1, NHTL1, PALB2, PDGFRA, PMS2, POLD1, POLE, PTEN, RAD50, RAD51C, RAD51D, RNF43, SDHB, SDHC, SDHD, SMAD4, SMARCA4. STK11, TP53, TSC1, TSC2, and VHL.  The following genes were evaluated for sequence changes only: SDHA and HOXB13  c.251G>A variant only. The report date is 08/26/2019.    10/21/2019 Cancer Staging   Staging form: Breast, AJCC 8th Edition - Pathologic stage from 10/21/2019: Stage IIIC (pT2, pN2a, cM0, G3, ER-, PR-, HER2-) - Signed by Sindy Guadeloupe, MD on 10/27/2019   10/12/2020 -  Chemotherapy   The patient had dexamethasone (DECADRON) 4 MG tablet, 8 mg, Oral, Daily, 1 of 1 cycle, Start date: 10/12/2020, End date: -- palonosetron (ALOXI) injection 0.25 mg, 0.25 mg, Intravenous,  Once, 3 of 4 cycles Administration: 0.25 mg (10/12/2020), 0.25 mg (10/19/2020), 0.25 mg (11/09/2020), 0.25 mg (11/02/2020), 0.25 mg (11/23/2020), 0.25 mg (12/01/2020) pegfilgrastim (NEULASTA ONPRO KIT) injection 6 mg, 6 mg, Subcutaneous, Once, 3 of 4 cycles Administration: 6 mg (10/19/2020), 6 mg (11/09/2020) CARBOplatin (PARAPLATIN) 210 mg in sodium chloride 0.9 % 250 mL chemo infusion, 210 mg (100 % of original dose 209.6 mg), Intravenous,  Once, 3 of 4 cycles Dose modification:   (original dose 209.6 mg, Cycle 1) Administration: 210 mg (10/12/2020), 210 mg (10/19/2020), 210 mg (11/09/2020), 210 mg (11/02/2020), 210 mg (11/23/2020) gemcitabine (GEMZAR) 1,800 mg in sodium chloride 0.9 % 250 mL chemo infusion, 1,862 mg, Intravenous,  Once, 3 of 4 cycles Administration: 1,800 mg (10/12/2020), 1,800 mg (10/19/2020), 1,800 mg (11/09/2020), 1,800 mg (11/02/2020), 1,800 mg (11/23/2020), 1,800 mg (12/01/2020)  for chemotherapy treatment.      Interval history- Patient is 65 year old female diagnosed with recurrent triple negative breast cancer who [facility] for complaints of constipation. Symptoms have gradually worsened over the past few days. Unresolved with senna and miralax. She took lactulose. On way to appointment she had resolution of symptoms with large bowel movement. Abdominal discomfort has now resolved. She feels weak and tired. Hasn't been eating and drinking well. No vomiting.   Review of systems-  Review of Systems  Constitutional: Negative for chills, fever, malaise/fatigue and weight loss.  HENT: Negative for hearing loss, nosebleeds, sore throat and tinnitus.   Eyes: Negative for blurred vision and double vision.  Respiratory: Negative for cough, hemoptysis, shortness of breath and wheezing.   Cardiovascular: Negative for chest pain, palpitations and leg swelling.  Gastrointestinal: Negative for abdominal pain, blood in stool, constipation, diarrhea, melena, nausea and vomiting.  Genitourinary: Negative for dysuria and urgency.  Musculoskeletal: Negative for back pain, falls, joint pain and myalgias.  Skin: Negative for itching and rash.  Neurological: Negative for dizziness, tingling, sensory change, loss of consciousness, weakness and headaches.  Endo/Heme/Allergies: Negative for environmental allergies. Does not bruise/bleed easily.  Psychiatric/Behavioral: Negative for depression. The patient is not nervous/anxious and does not have insomnia.      Allergies  Allergen Reactions  . Amitiza [Lubiprostone] Other (See Comments)    Overly effective at higher dose  . Bentyl [Dicyclomine Hcl]     Lack of effect  . Cortisone Other (See Comments)    flush  . Mobic [Meloxicam]     Palpitations.  But can tolerate aleve  . Nitrofurantoin Monohyd Macro   . Sulfonamide Derivatives Nausea Only  . Zoloft [Sertraline Hcl] Diarrhea and Nausea Only  . Buspar [Buspirone] Palpitations  . Celecoxib Rash    REACTION: unspecified  . Doxycycline Palpitations    chest pain  . Penicillins Rash    Past Medical History:  Diagnosis Date  . Anemia    d/t chemo - last Hgb 10.8  . Anxiety    sees Dr. [name]  . BRCA negative    [facility] panel neg except CHEK2 VUS  .  Breast cancer ([place]) February 02, 2019  . Cyst of breast    per [name]  . Dysrhythmia    Hx of palpitations  . Family history of breast cancer   . Family history of colon cancer   . Family history of leukemia   . Family history of  ovarian cancer   . Fibromyalgia   . GERD (gastroesophageal reflux disease)   . Heart murmur   . Hemorrhoids   . Herpes, genital 04/2015   confirmed with HSV 2 IgG  . Hiatal hernia   . Hypercholesterolemia    Dr. Kyra Searles  . Hyperlipidemia   . IBS (irritable bowel syndrome)    constipation predominant  . IC (interstitial cystitis)    per [facility]  . Mild depression ([place])   . MVP (mitral valve prolapse)    [facility] cards eval 02-02-11  . Osteopenia    2010/2017, DEXA at [facility];spine and fem neck  . PONV (postoperative nausea and vomiting)   . Vitamin D deficiency    history of    Past Surgical History:  Procedure Laterality Date  . ABDOMINAL HYSTERECTOMY    . APPENDECTOMY    . BLADDER SURGERY     1980's  . BREAST EXCISIONAL BIOPSY Right    benign  . BREAST EXCISIONAL BIOPSY Bilateral    benign  . COLONOSCOPY  09/2014   Dr. [name]  . COLONOSCOPY  02/03/2008   at [facility] 1 POLYP (BENIGN)  . excision of breast cysts     hx of multiple cyst aspirations  . EXPLORATORY LAPAROTOMY    . MASTECTOMY MODIFIED RADICAL Bilateral 10/09/2019   Procedure: RIGHT MASTECTOMY MODIFIED RADICAL AND LEFT TOTAL MASTECTOMY;  Surgeon: Rolm Bookbinder, MD;  Location: [facility];  Service: General;  Laterality: Bilateral;  . PORTA CATH INSERTION N/A 08/06/2019   Procedure: PORTA CATH INSERTION;  Surgeon: Katha Cabal, MD;  Location: [facility];  Service: Cardiovascular;  Laterality: N/A;  . PORTACATH PLACEMENT Left 08/05/2019   Procedure: INSERTION PORT-A-CATH, Attempted;  Surgeon: Jules Husbands, MD;  Location: [facility];  Service: General;  Laterality: Left;  . TUBAL LIGATION      Social History   Socioeconomic History  . Marital status: Divorced    Spouse name: Not on file  . Number of children: 1  . Years of education: Not on file  . Highest education level: Not on file  Occupational History  . Occupation: [employer]  Tobacco Use  . Smoking status: Never Smoker  . Smokeless tobacco:  Never Used  Vaping Use  . Vaping Use: Never used  Substance and Sexual Activity  . Alcohol use: No    Alcohol/week: 0.0 standard drinks  . Drug use: No  . Sexual activity: Yes    Birth control/protection: Surgical    Comment: Hysterectomy  Other Topics Concern  . Not on file  Social History Narrative   Married in February 02, 1997, divorced as of 02/02/17, 1 son from previous relationship   Brother with MVA at 27, in rest home for many years as of 02-Feb-2018- died of covid recently   Social Determinants of Radio broadcast assistant Strain: [place]   . Difficulty of Paying Living Expenses: Not hard at all  Food Insecurity: No Food Insecurity  . Worried About Charity fundraiser in the Last Year: Never true  . Ran Out of Food in the Last Year: Never true  Transportation Needs: No Transportation Needs  . Lack of Transportation (Medical): No  .  Lack of Transportation (Non-Medical): No  Physical Activity: Inactive  . Days of Exercise per Week: 0 days  . Minutes of Exercise per Session: 0 min  Stress: No Stress Concern Present  . Feeling of Stress : Not at all  Social Connections: Not on file  Intimate Partner Violence: Not At Risk  . Fear of Current or Ex-Partner: No  . Emotionally Abused: No  . Physically Abused: No  . Sexually Abused: No   Immunization History  Administered Date(s) Administered  . Influenza Whole 09/08/2010  . Influenza, Seasonal, Injecte, Preservative Fre 09/08/2014  . Influenza,inj,Quad PF,6+ Mos 09/23/2013, 08/15/2018, 08/02/2019  . Influenza-Unspecified 09/11/2015, 08/14/2020, 08/18/2020     Family History  Problem Relation Age of Onset  . Breast cancer Mother 74  . Hypertension Mother   . Colon cancer Father 63  . Hyperlipidemia Father   . Ovarian cancer Maternal Aunt 80  . Breast cancer Cousin   . Gallbladder disease Paternal Grandmother   . Liver cancer Cousin 70       Malignant  . Cancer Maternal Uncle        unsure type     Current Outpatient  Medications:  .  acetaminophen (TYLENOL) 650 MG CR tablet, Take 650 mg by mouth every 8 (eight) hours as needed for pain., Disp: , Rfl:  .  ascorbic acid (VITAMIN C) 500 MG tablet, Take 500 mg by mouth daily., Disp: , Rfl:  .  atorvastatin (LIPITOR) 40 MG tablet, Take 40 mg by mouth at bedtime. , Disp: , Rfl: 1 .  Calcium Carbonate-Vit D-Min (CALCIUM 1200 PO), Take 4 tablets by mouth daily., Disp: , Rfl:  .  cholecalciferol (VITAMIN D3) 25 MCG (1000 UNIT) tablet, Take 1 tablet (1,000 Units total) by mouth daily., Disp: , Rfl:  .  clonazePAM (KLONOPIN) 0.5 MG tablet, Take 0.5 mg by mouth 2 (two) times daily. , Disp: , Rfl:  .  dexamethasone (DECADRON) 4 MG tablet, Take 2 tablets (8 mg total) by mouth daily. Start the day after chemotherapy for 2 days. Take with food., Disp: 30 tablet, Rfl: 1 .  Dexlansoprazole 30 MG capsule, Take 1 capsule (30 mg total) by mouth daily., Disp: 30 capsule, Rfl: 6 .  DULoxetine (CYMBALTA) 60 MG capsule, Take 60 mg by mouth daily. , Disp: , Rfl:  .  furosemide (LASIX) 20 MG tablet, Take 1 tablet (20 mg total) by mouth every other day., Disp: , Rfl:  .  HYDROmorphone HCl (DILAUDID) 1 MG/ML LIQD, Take 0.5 mLs (0.5 mg total) by mouth every 4 (four) hours as needed for severe pain., Disp: 90 mL, Rfl: 0 .  lactulose (CHRONULAC) 10 GM/15ML solution, Take 45 mLs (30 g total) by mouth 2 (two) times daily., Disp: 236 mL, Rfl: 1 .  lidocaine (LIDODERM) 5 %, Place 1 patch onto the skin daily. Remove & Discard patch within 12 hours or as directed by MD (Patient not taking: Reported on 11/23/2020), Disp: 30 patch, Rfl: 0 .  lidocaine-prilocaine (EMLA) cream, Apply to affected area once, Disp: 30 g, Rfl: 3 .  loratadine (CLARITIN) 10 MG tablet, Take 1 tablet (10 mg total) by mouth daily., Disp: , Rfl:  .  NON FORMULARY, Take 5 mLs by mouth 4 (four) times daily as needed. Swish and swallow 5-10 ml of Medicated Mouthwash 4 x per day as needed, Disp: , Rfl:  .  OLANZapine (ZYPREXA) 10 MG  tablet, Take 1 tablet (10 mg total) by mouth at bedtime. For nausea or vomiting (  Patient not taking: No sig reported), Disp: 30 tablet, Rfl: 0 .  ondansetron (ZOFRAN) 8 MG tablet, Take 1 tablet (8 mg total) by mouth 2 (two) times daily as needed for refractory nausea / vomiting. Start on day 3 after chemotherapy. (Patient not taking: No sig reported), Disp: 30 tablet, Rfl: 1 .  Plecanatide (TRULANCE) 3 MG TABS, Take 3 mg by mouth daily., Disp: 30 tablet, Rfl: 11 .  polyethylene glycol (MIRALAX / GLYCOLAX) 17 g packet, Take 17 g by mouth daily., Disp: , Rfl:  .  pregabalin (LYRICA) 75 MG capsule, Take 1 capsule (75 mg total) by mouth 2 (two) times daily., Disp: 60 capsule, Rfl: 2 .  prochlorperazine (COMPAZINE) 10 MG tablet, Take 1 tablet (10 mg total) by mouth every 6 (six) hours as needed (Nausea or vomiting). (Patient not taking: No sig reported), Disp: 30 tablet, Rfl: 1 .  sennosides-docusate sodium (SENOKOT-S) 8.6-50 MG tablet, Take 1-2 tablets by mouth at bedtime.  (Patient not taking: Reported on 11/23/2020), Disp: , Rfl:  .  sucralfate (CARAFATE) 1 g tablet, Take 1 g by mouth 3 (three) times daily.  (Patient not taking: No sig reported), Disp: , Rfl:  .  triamcinolone (KENALOG) 0.1 %, Apply 1 application topically 2 (two) times daily as needed. For itching, Disp: 80 g, Rfl: 0 .  valACYclovir (VALTREX) 500 MG tablet, Take 1 tablet (500 mg total) by mouth daily., Disp: 90 tablet, Rfl: 3 .  vitamin B-12 (CYANOCOBALAMIN) 500 MCG tablet, Take 500 mcg by mouth daily., Disp: , Rfl:   Physical exam:  Vitals:   10/15/20 1453  BP: (!) 101/52  Pulse: 91  Resp: 17  Temp: 99 F (37.2 C)  TempSrc: Tympanic  SpO2: 99%   Physical Exam Constitutional:      General: She is not in acute distress.    Comments: Fatigued appearing  Cardiovascular:     Rate and Rhythm: Normal rate and regular rhythm.  Pulmonary:     Effort: Pulmonary effort is normal.     Breath sounds: Normal breath sounds.   Abdominal:     General: There is no distension.     Palpations: Abdomen is soft.     Tenderness: There is no abdominal tenderness. There is no guarding.  Musculoskeletal:     Cervical back: Neck supple.  Skin:    General: Skin is warm and dry.  Neurological:     Mental Status: She is oriented to person, place, and time.  Psychiatric:        Mood and Affect: Mood normal.        Behavior: Behavior normal.     No results found.  Assessment and plan- Patient is a 65 y.o. female diagnosed with recurrent triple negative breast cancer who [facility] for constipation with resolution of symptoms post lactulose. Will give IV fluids today given poor oral intake over past 24 hours. Will send zyprexa for nausea and short course of steroids.   Follow up with DR. Janese Banks as scheduled or return to symptom management clinic if symptoms don't improve or worsen.    Visit Diagnosis 1. Recurrent breast cancer, right ([place])   2. Constipation, unspecified constipation type    Patient expressed understanding and was in agreement with this plan. She also understands that She can call clinic at any time with any questions, concerns, or complaints.   Thank you for allowing me to participate in the care of this very pleasant patient.   Beckey Rutter,  DNP, AGNP-C [facility] [place]

## 2020-12-09 ENCOUNTER — Other Ambulatory Visit: Payer: Self-pay

## 2020-12-09 ENCOUNTER — Encounter
Admission: RE | Admit: 2020-12-09 | Discharge: 2020-12-09 | Disposition: A | Discharge status: Home / Self Care | Payer: Medicare Other | Source: Ambulatory Visit | Attending: Oncology | Admitting: Oncology

## 2020-12-09 DIAGNOSIS — C50811 Malignant neoplasm of overlapping sites of right female breast: Secondary | ICD-10-CM

## 2020-12-09 DIAGNOSIS — Z171 Estrogen receptor negative status [ER-]: Secondary | ICD-10-CM | POA: Diagnosis not present

## 2020-12-09 DIAGNOSIS — C50911 Malignant neoplasm of unspecified site of right female breast: Secondary | ICD-10-CM | POA: Diagnosis not present

## 2020-12-09 LAB — GLUCOSE, CAPILLARY: Glucose-Capillary: 86 mg/dL (ref 70–99)

## 2020-12-09 MED ORDER — FLUDEOXYGLUCOSE F - 18 (FDG) INJECTION
9.2000 | Freq: Once | INTRAVENOUS | Status: AC | PRN
  Administered 2020-12-09: 9.2 via INTRAVENOUS

## 2020-12-14 ENCOUNTER — Ambulatory Visit
Admission: RE | Admit: 2020-12-14 | Discharge: 2020-12-14 | Disposition: A | Discharge status: Home / Self Care | Payer: Medicare Other | Source: Ambulatory Visit | Attending: Radiation Oncology | Admitting: Radiation Oncology

## 2020-12-14 ENCOUNTER — Inpatient Hospital Stay: Payer: Medicare Other

## 2020-12-14 ENCOUNTER — Other Ambulatory Visit: Payer: Self-pay | Admitting: *Deleted

## 2020-12-14 ENCOUNTER — Inpatient Hospital Stay (HOSPITAL_BASED_OUTPATIENT_CLINIC_OR_DEPARTMENT_OTHER): Payer: Medicare Other | Admitting: Oncology

## 2020-12-14 ENCOUNTER — Encounter: Payer: Self-pay | Admitting: Oncology

## 2020-12-14 ENCOUNTER — Other Ambulatory Visit: Payer: Self-pay

## 2020-12-14 VITALS — BP 126/59 | HR 86 | Resp 16

## 2020-12-14 VITALS — BP 122/54 | HR 80 | Temp 98.3°F | Resp 16 | Ht 67.0 in | Wt 164.0 lb

## 2020-12-14 DIAGNOSIS — C787 Secondary malignant neoplasm of liver and intrahepatic bile duct: Secondary | ICD-10-CM | POA: Diagnosis not present

## 2020-12-14 DIAGNOSIS — Z803 Family history of malignant neoplasm of breast: Secondary | ICD-10-CM | POA: Diagnosis not present

## 2020-12-14 DIAGNOSIS — Z923 Personal history of irradiation: Secondary | ICD-10-CM | POA: Insufficient documentation

## 2020-12-14 DIAGNOSIS — C50919 Malignant neoplasm of unspecified site of unspecified female breast: Secondary | ICD-10-CM

## 2020-12-14 DIAGNOSIS — Z9013 Acquired absence of bilateral breasts and nipples: Secondary | ICD-10-CM | POA: Diagnosis not present

## 2020-12-14 DIAGNOSIS — R5383 Other fatigue: Secondary | ICD-10-CM | POA: Diagnosis not present

## 2020-12-14 DIAGNOSIS — G893 Neoplasm related pain (acute) (chronic): Secondary | ICD-10-CM | POA: Diagnosis not present

## 2020-12-14 DIAGNOSIS — M79601 Pain in right arm: Secondary | ICD-10-CM | POA: Diagnosis not present

## 2020-12-14 DIAGNOSIS — Z79899 Other long term (current) drug therapy: Secondary | ICD-10-CM | POA: Diagnosis not present

## 2020-12-14 DIAGNOSIS — Z171 Estrogen receptor negative status [ER-]: Secondary | ICD-10-CM

## 2020-12-14 DIAGNOSIS — Z08 Encounter for follow-up examination after completed treatment for malignant neoplasm: Secondary | ICD-10-CM | POA: Diagnosis not present

## 2020-12-14 DIAGNOSIS — Z5189 Encounter for other specified aftercare: Secondary | ICD-10-CM | POA: Diagnosis not present

## 2020-12-14 DIAGNOSIS — Z5111 Encounter for antineoplastic chemotherapy: Secondary | ICD-10-CM | POA: Diagnosis not present

## 2020-12-14 DIAGNOSIS — Z5112 Encounter for antineoplastic immunotherapy: Secondary | ICD-10-CM

## 2020-12-14 DIAGNOSIS — R5381 Other malaise: Secondary | ICD-10-CM | POA: Diagnosis not present

## 2020-12-14 DIAGNOSIS — I890 Lymphedema, not elsewhere classified: Secondary | ICD-10-CM

## 2020-12-14 DIAGNOSIS — K769 Liver disease, unspecified: Secondary | ICD-10-CM | POA: Diagnosis not present

## 2020-12-14 DIAGNOSIS — C50811 Malignant neoplasm of overlapping sites of right female breast: Secondary | ICD-10-CM | POA: Diagnosis not present

## 2020-12-14 DIAGNOSIS — C50511 Malignant neoplasm of lower-outer quadrant of right female breast: Secondary | ICD-10-CM

## 2020-12-14 LAB — CBC WITH DIFFERENTIAL/PLATELET
Abs Immature Granulocytes: 1.75 10*3/uL — ABNORMAL HIGH (ref 0.00–0.07)
Basophils Absolute: 0.1 10*3/uL (ref 0.0–0.1)
Basophils Relative: 1 %
Eosinophils Absolute: 0.2 10*3/uL (ref 0.0–0.5)
Eosinophils Relative: 1 %
HCT: 32.9 % — ABNORMAL LOW (ref 36.0–46.0)
Hemoglobin: 10.5 g/dL — ABNORMAL LOW (ref 12.0–15.0)
Immature Granulocytes: 7 %
Lymphocytes Relative: 5 %
Lymphs Abs: 1.4 10*3/uL (ref 0.7–4.0)
MCH: 29.1 pg (ref 26.0–34.0)
MCHC: 31.9 g/dL (ref 30.0–36.0)
MCV: 91.1 fL (ref 80.0–100.0)
Monocytes Absolute: 2.4 10*3/uL — ABNORMAL HIGH (ref 0.1–1.0)
Monocytes Relative: 9 %
Neutro Abs: 20.3 10*3/uL — ABNORMAL HIGH (ref 1.7–7.7)
Neutrophils Relative %: 77 %
Platelets: 249 10*3/uL (ref 150–400)
RBC: 3.61 MIL/uL — ABNORMAL LOW (ref 3.87–5.11)
RDW: 19.6 % — ABNORMAL HIGH (ref 11.5–15.5)
WBC: 26.2 10*3/uL — ABNORMAL HIGH (ref 4.0–10.5)
nRBC: 0.1 % (ref 0.0–0.2)

## 2020-12-14 LAB — COMPREHENSIVE METABOLIC PANEL
ALT: 40 U/L (ref 0–44)
AST: 33 U/L (ref 15–41)
Albumin: 3.8 g/dL (ref 3.5–5.0)
Alkaline Phosphatase: 148 U/L — ABNORMAL HIGH (ref 38–126)
Anion gap: 9 (ref 5–15)
BUN: 11 mg/dL (ref 8–23)
CO2: 27 mmol/L (ref 22–32)
Calcium: 9.1 mg/dL (ref 8.9–10.3)
Chloride: 102 mmol/L (ref 98–111)
Creatinine, Ser: 0.66 mg/dL (ref 0.44–1.00)
GFR, Estimated: >60 mL/min (ref >60)
Glucose, Bld: 109 mg/dL — ABNORMAL HIGH (ref 70–99)
Potassium: 4.1 mmol/L (ref 3.5–5.1)
Sodium: 138 mmol/L (ref 135–145)
Total Bilirubin: 0.4 mg/dL (ref 0.3–1.2)
Total Protein: 7.1 g/dL (ref 6.5–8.1)

## 2020-12-14 MED ORDER — HEPARIN SOD (PORK) LOCK FLUSH 100 UNIT/ML IV SOLN
500.0000 [IU] | Freq: Once | INTRAVENOUS | Status: AC
  Administered 2020-12-14: 500 [IU] via INTRAVENOUS
  Filled 2020-12-14: qty 5

## 2020-12-14 MED ORDER — SODIUM CHLORIDE 0.9 % IV SOLN
Freq: Once | INTRAVENOUS | Status: AC
  Filled 2020-12-14: qty 250

## 2020-12-14 MED ORDER — DEXAMETHASONE SODIUM PHOSPHATE 10 MG/ML IJ SOLN
4.0000 mg | Freq: Once | INTRAMUSCULAR | Status: AC
  Administered 2020-12-14: 4 mg via INTRAVENOUS
  Filled 2020-12-14: qty 1

## 2020-12-14 MED ORDER — HEPARIN SOD (PORK) LOCK FLUSH 100 UNIT/ML IV SOLN
500.0000 [IU] | Freq: Once | INTRAVENOUS | Status: DC | PRN
  Filled 2020-12-14: qty 5

## 2020-12-14 MED ORDER — PALONOSETRON HCL INJECTION 0.25 MG/5ML
0.2500 mg | Freq: Once | INTRAVENOUS | Status: AC
  Administered 2020-12-14: 0.25 mg via INTRAVENOUS
  Filled 2020-12-14: qty 5

## 2020-12-14 MED ORDER — HEPARIN SOD (PORK) LOCK FLUSH 100 UNIT/ML IV SOLN
INTRAVENOUS | Status: AC
  Filled 2020-12-14: qty 5

## 2020-12-14 MED ORDER — SODIUM CHLORIDE 0.9 % IV SOLN
1800.0000 mg | Freq: Once | INTRAVENOUS | Status: AC
  Administered 2020-12-14: 1800 mg via INTRAVENOUS
  Filled 2020-12-14: qty 26.3

## 2020-12-14 MED ORDER — CARBOPLATIN CHEMO INJECTION 450 MG/45ML
210.0000 mg | Freq: Once | INTRAVENOUS | Status: AC
  Administered 2020-12-14: 210 mg via INTRAVENOUS
  Filled 2020-12-14: qty 21

## 2020-12-14 MED ORDER — SODIUM CHLORIDE 0.9 % IV SOLN: 4.0000 mg | Freq: Once | INTRAVENOUS | Status: DC

## 2020-12-14 MED ORDER — SODIUM CHLORIDE 0.9% FLUSH
10.0000 mL | INTRAVENOUS | Status: DC | PRN
  Administered 2020-12-14: 10 mL via INTRAVENOUS
  Filled 2020-12-14: qty 10

## 2020-12-14 NOTE — Progress Notes (Signed)
Patient tolerated infusion well. Patient and VSS. Discharged to radiation.

## 2020-12-14 NOTE — Progress Notes (Signed)
Hematology/Oncology Consult note [facility]  [phone] [phone]  Patient Care Team: Tonia Ghent, MD as PCP - General (Family Medicine) Bary Castilla, Forest Gleason, MD (General Surgery) Modesto Charon, MD (Family Medicine) Laneta Simmers as Physician Assistant (Urology) [name] Javier Docker, MD as Consulting Physician (Cardiology) Sindy Guadeloupe, MD as Consulting Physician (Hematology and Oncology) Noreene Filbert, MD as Referring Physician (Radiation Oncology)   Name of the patient: Debra Little  [id number]  1955/12/25   Date of visit: 12/14/20  Diagnosis- 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 in the past and has undergone breast biopsies which did not previously showed malignancy. More recently patient noted some red discoloration around her right perioral as well as a palpable mass which led to a diagnostic mammogram on the right side her prior mammogram in February 2020 was normal in August 2020 she was noted to have a 2.7 x 2 x 2.3 cm mass in her right breast along with 4 morphologically abnormal lymph nodes. She underwent breast biopsy as well as lymph node biopsy which showed grade 3 invasive mammary carcinoma. ER PR and HER-2/neunegative.Sections show high-grade invasive carcinoma with focal squamous differentiation and pinpoint keratin ideation. The features are concerning for metaplastic differentiation.[name] Lymphovascular invasion present. There was extranodal extension present on the lymph node biopsy.   Her family history is significant for breast cancer in her mother in her 4s. Father had colon cancer in his70s.Family history also significant for ovarian cancer in her maternal  aunt.  MRI of bilateral breasts showed: Primary right breast mass was 3.5 x 2.7 x 3.4 cm. There were surrounding numerous nodules consistent with satellite lesions. Extensive nodular non-mass enhancement throughout the right breast involving the upper and lower quadrants spanning 6.2 x 4.5 x 5.5 cm. Markedly enhancement of the left breast diffusely. Non-mass enhancement measures 1.9 x 2.4 cm. At least 3 abnormal lymph nodes in the right axilla. No abnormal left axillary lymph nodes. 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 Equivocal inferior breast and left axillary nodal hypermetabolism. No evidence of extrathoracic hypermetabolic metastases. Lateral right breast primary with right axillary nodal metastases  Patient had 3 cycles of neoadjuvant AC chemotherapy along with Keytruda. There was no significant response in her tumor and patient proceeded with bilateral mastectomy with targeted node dissection. 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 7 lymph nodes were positive for malignancy with extranodal extension overall cancer cellularity was 40% [id number].Patient completed 1 cycle of adjuvant AC chemotherapy and willcomplete weekly Taxol chemotherapy in April 2021. She completed post mastectomy radiation  PET scan in November 2021 was done for ongoing chest wall pain. It showed:1. Aggressive appearing and extensive recurrent right breast cancer with right chest wall involvement as detailed above. 2. No findings for mediastinal/hilar metastatic lymphadenopathy or metastatic pulmonary disease. 3. Single left hepatic lobe liver lesion consistent with metastatic focus. 4. Hypermetabolic soft tissue mass in the left oropharynx/tonsillar region suspicious for primary neoplasm.  NGS testing showed PIK3CA mutation and PD-L1 CPS  score of 95. No other actionable mutation was seen.   Interval history-patient does report pain and swelling in her right upper extremity.  She has  not been using her lymphedema sleeve recently.  Bowel movements are regular for the most part but sometimes she gets episodes of diarrhea.   ECOG PS- 1 Pain scale- 4 Opioid associated constipation- no  Review of systems- Review of Systems  Constitutional: Positive for malaise/fatigue. Negative for chills, fever and weight loss.  HENT: Negative for congestion, ear discharge and nosebleeds.   Eyes: Negative for blurred vision.  Respiratory: Negative for cough, hemoptysis, sputum production, shortness of breath and wheezing.   Cardiovascular: Negative for chest pain, palpitations, orthopnea and claudication.  Gastrointestinal: Negative for abdominal pain, blood in stool, constipation, diarrhea, heartburn, melena, nausea and vomiting.       Abdominal bloating  Genitourinary: Negative for dysuria, flank pain, frequency, hematuria and urgency.  Musculoskeletal: Negative for back pain, joint pain and myalgias.       Right upper extremity pain  Skin: Negative for rash.  Neurological: Negative for dizziness, tingling, focal weakness, seizures, weakness and headaches.  Endo/Heme/Allergies: Does not bruise/bleed easily.  Psychiatric/Behavioral: Negative for depression and suicidal ideas. The patient does not have insomnia.       Allergies  Allergen Reactions  . Amitiza [Lubiprostone] Other (See Comments)    Overly effective at higher dose  . Bentyl [Dicyclomine Hcl]     Lack of effect  . Cortisone Other (See Comments)    flush  . Mobic [Meloxicam]     Palpitations.  But can tolerate aleve  . Nitrofurantoin Monohyd Macro   . Sulfonamide Derivatives Nausea Only  . Zoloft [Sertraline Hcl] Diarrhea and Nausea Only  . Buspar [Buspirone] Palpitations  . Celecoxib Rash    REACTION: unspecified  . Doxycycline Palpitations    chest pain  .  Penicillins Rash     Past Medical History:  Diagnosis Date  . Anemia    d/t chemo - last Hgb 10.8  . Anxiety    sees Dr. [name]  . BRCA negative    Invitae panel neg except CHEK2 VUS  . Breast cancer ([place]) 2020  . Cyst of breast    per [name]  . Dysrhythmia    Hx of palpitations  . Family history of breast cancer   . Family history of colon cancer   . Family history of leukemia   . Family history of ovarian cancer   . Fibromyalgia   . GERD (gastroesophageal reflux disease)   . Heart murmur   . Hemorrhoids   . Herpes, genital 04/2015   confirmed with HSV 2 IgG  . Hiatal hernia   . Hypercholesterolemia    Dr. Kyra Searles  . Hyperlipidemia   . IBS (irritable bowel syndrome)    constipation predominant  . IC (interstitial cystitis)    per [facility]  . Mild depression ([place])   . MVP (mitral valve prolapse)    [facility] cards eval 2012  . Osteopenia    2010/2017, DEXA at [facility];spine and fem neck  . PONV (postoperative nausea and vomiting)   . Vitamin D deficiency    history of     Past Surgical History:  Procedure Laterality Date  . ABDOMINAL HYSTERECTOMY    . APPENDECTOMY    . BLADDER SURGERY     1980's  . BREAST EXCISIONAL BIOPSY Right    benign  . BREAST EXCISIONAL BIOPSY Bilateral    benign  . COLONOSCOPY  09/2014   Dr. [name]  . COLONOSCOPY  2009   at [facility] 1 POLYP (BENIGN)  . excision of breast cysts  hx of multiple cyst aspirations  . EXPLORATORY LAPAROTOMY    . MASTECTOMY MODIFIED RADICAL Bilateral 10/09/2019   Procedure: RIGHT MASTECTOMY MODIFIED RADICAL AND LEFT TOTAL MASTECTOMY;  Surgeon: Rolm Bookbinder, MD;  Location: [place];  Service: General;  Laterality: Bilateral;  . PORTA CATH INSERTION N/A 08/06/2019   Procedure: PORTA CATH INSERTION;  Surgeon: Katha Cabal, MD;  Location: [place] [facility];  Service: Cardiovascular;  Laterality: N/A;  . PORTACATH PLACEMENT Left 08/05/2019   Procedure: INSERTION PORT-A-CATH, Attempted;   Surgeon: Jules Husbands, MD;  Location: [facility];  Service: General;  Laterality: Left;  . TUBAL LIGATION      Social History   Socioeconomic History  . Marital status: Divorced    Spouse name: Not on file  . Number of children: 1  . Years of education: Not on file  . Highest education level: Not on file  Occupational History  . Occupation: [employer]  Tobacco Use  . Smoking status: Never Smoker  . Smokeless tobacco: Never Used  Vaping Use  . Vaping Use: Never used  Substance and Sexual Activity  . Alcohol use: No    Alcohol/week: 0.0 standard drinks  . Drug use: No  . Sexual activity: Yes    Birth control/protection: Surgical    Comment: Hysterectomy  Other Topics Concern  . Not on file  Social History Narrative   Married in Jan 23, 1997, divorced as of 2017/01/23, 1 son from previous relationship   Brother with MVA at 59, in rest home for many years as of 23-Jan-2018- died of covid recently   Social Determinants of Radio broadcast assistant Strain: [place]   . Difficulty of Paying Living Expenses: Not hard at all  Food Insecurity: No Food Insecurity  . Worried About Charity fundraiser in the Last Year: Never true  . Ran Out of Food in the Last Year: Never true  Transportation Needs: No Transportation Needs  . Lack of Transportation (Medical): No  . Lack of Transportation (Non-Medical): No  Physical Activity: Inactive  . Days of Exercise per Week: 0 days  . Minutes of Exercise per Session: 0 min  Stress: No Stress Concern Present  . Feeling of Stress : Not at all  Social Connections: Not on file  Intimate Partner Violence: Not At Risk  . Fear of Current or Ex-Partner: No  . Emotionally Abused: No  . Physically Abused: No  . Sexually Abused: No    Family History  Problem Relation Age of Onset  . Breast cancer Mother 103  . Hypertension Mother   . Colon cancer Father 73  . Hyperlipidemia Father   . Ovarian cancer Maternal Aunt 80  . Breast cancer Cousin   . Gallbladder  disease Paternal Grandmother   . Liver cancer Cousin 70       Malignant  . Cancer Maternal Uncle        unsure type     Current Outpatient Medications:  .  acetaminophen (TYLENOL) 650 MG CR tablet, Take 650 mg by mouth every 8 (eight) hours as needed for pain., Disp: , Rfl:  .  ascorbic acid (VITAMIN C) 500 MG tablet, Take 500 mg by mouth daily., Disp: , Rfl:  .  atorvastatin (LIPITOR) 40 MG tablet, Take 40 mg by mouth at bedtime. , Disp: , Rfl: 1 .  Calcium Carbonate-Vit D-Min (CALCIUM 1200 PO), Take 4 tablets by mouth daily., Disp: , Rfl:  .  cholecalciferol (VITAMIN D3) 25 MCG (1000 UNIT) tablet,  Take 1 tablet (1,000 Units total) by mouth daily., Disp: , Rfl:  .  clonazePAM (KLONOPIN) 0.5 MG tablet, Take 0.5 mg by mouth 2 (two) times daily. , Disp: , Rfl:  .  dexamethasone (DECADRON) 4 MG tablet, Take 2 tablets (8 mg total) by mouth daily. Start the day after chemotherapy for 2 days. Take with food., Disp: 30 tablet, Rfl: 1 .  Dexlansoprazole 30 MG capsule, Take 1 capsule (30 mg total) by mouth daily., Disp: 30 capsule, Rfl: 6 .  DULoxetine (CYMBALTA) 60 MG capsule, Take 60 mg by mouth daily. , Disp: , Rfl:  .  furosemide (LASIX) 20 MG tablet, Take 1 tablet (20 mg total) by mouth every other day., Disp: , Rfl:  .  HYDROmorphone HCl (DILAUDID) 1 MG/ML LIQD, Take 0.5 mLs (0.5 mg total) by mouth every 4 (four) hours as needed for severe pain., Disp: 90 mL, Rfl: 0 .  lactulose (CHRONULAC) 10 GM/15ML solution, Take 45 mLs (30 g total) by mouth 2 (two) times daily., Disp: 236 mL, Rfl: 1 .  lidocaine (LIDODERM) 5 %, Place 1 patch onto the skin daily. Remove & Discard patch within 12 hours or as directed by MD (Patient not taking: Reported on 11/23/2020), Disp: 30 patch, Rfl: 0 .  lidocaine-prilocaine (EMLA) cream, Apply to affected area once, Disp: 30 g, Rfl: 3 .  loratadine (CLARITIN) 10 MG tablet, Take 1 tablet (10 mg total) by mouth daily., Disp: , Rfl:  .  NON FORMULARY, Take 5 mLs by mouth 4  (four) times daily as needed. Swish and swallow 5-10 ml of Medicated Mouthwash 4 x per day as needed, Disp: , Rfl:  .  OLANZapine (ZYPREXA) 10 MG tablet, Take 1 tablet (10 mg total) by mouth at bedtime. For nausea or vomiting (Patient not taking: No sig reported), Disp: 30 tablet, Rfl: 0 .  ondansetron (ZOFRAN) 8 MG tablet, Take 1 tablet (8 mg total) by mouth 2 (two) times daily as needed for refractory nausea / vomiting. Start on day 3 after chemotherapy. (Patient not taking: No sig reported), Disp: 30 tablet, Rfl: 1 .  Plecanatide (TRULANCE) 3 MG TABS, Take 3 mg by mouth daily., Disp: 30 tablet, Rfl: 11 .  polyethylene glycol (MIRALAX / GLYCOLAX) 17 g packet, Take 17 g by mouth daily., Disp: , Rfl:  .  pregabalin (LYRICA) 75 MG capsule, Take 1 capsule (75 mg total) by mouth 2 (two) times daily., Disp: 60 capsule, Rfl: 2 .  prochlorperazine (COMPAZINE) 10 MG tablet, Take 1 tablet (10 mg total) by mouth every 6 (six) hours as needed (Nausea or vomiting). (Patient not taking: No sig reported), Disp: 30 tablet, Rfl: 1 .  sennosides-docusate sodium (SENOKOT-S) 8.6-50 MG tablet, Take 1-2 tablets by mouth at bedtime.  (Patient not taking: Reported on 11/23/2020), Disp: , Rfl:  .  sucralfate (CARAFATE) 1 g tablet, Take 1 g by mouth 3 (three) times daily.  (Patient not taking: No sig reported), Disp: , Rfl:  .  triamcinolone (KENALOG) 0.1 %, Apply 1 application topically 2 (two) times daily as needed. For itching, Disp: 80 g, Rfl: 0 .  valACYclovir (VALTREX) 500 MG tablet, Take 1 tablet (500 mg total) by mouth daily., Disp: 90 tablet, Rfl: 3 .  vitamin B-12 (CYANOCOBALAMIN) 500 MCG tablet, Take 500 mcg by mouth daily., Disp: , Rfl:   Physical exam: There were no vitals filed for this visit. Physical Exam Eyes:     Extraocular Movements: EOM normal.  Cardiovascular:  Rate and Rhythm: Normal rate and regular rhythm.     Heart sounds: Normal heart sounds.  Pulmonary:     Effort: Pulmonary effort is  normal.  Abdominal:     General: Bowel sounds are normal.     Palpations: Abdomen is soft.  Musculoskeletal:     Comments: Right upper extremity is diffusely more swollen elbow upwards as compared to left  Skin:    General: Skin is warm and dry.  Neurological:     Mental Status: She is alert and oriented to person, place, and time.      CMP Latest Ref Rng & Units 12/01/2020  Glucose 70 - 99 mg/dL 107(H)  BUN 8 - 23 mg/dL 13  Creatinine 0.44 - 1.00 mg/dL 0.67  Sodium 135 - 145 mmol/L 135  Potassium 3.5 - 5.1 mmol/L 3.8  Chloride 98 - 111 mmol/L 100  CO2 22 - 32 mmol/L 26  Calcium 8.9 - 10.3 mg/dL 8.6(L)  Total Protein 6.5 - 8.1 g/dL 6.5  Total Bilirubin 0.3 - 1.2 mg/dL 0.6  Alkaline Phos 38 - 126 U/L 78  AST 15 - 41 U/L 28  ALT 0 - 44 U/L 38   CBC Latest Ref Rng & Units 12/01/2020  WBC 4.0 - 10.5 K/uL 8.2  Hemoglobin 12.0 - 15.0 g/dL 9.8(L)  Hematocrit 36.0 - 46.0 % 29.5(L)  Platelets 150 - 400 K/uL 188    No images are attached to the encounter.  NM PET Image Restag (PS) Skull Base To Thigh  Result Date: 12/09/2020 CLINICAL DATA:  Subsequent treatment strategy for breast cancer. Evaluate treatment. Right breast primary. Bilateral mastectomy. EXAM: NUCLEAR MEDICINE PET SKULL BASE TO THIGH TECHNIQUE: 9.2 mCi F-18 FDG was injected intravenously. Full-ring PET imaging was performed from the skull base to thigh after the radiotracer. CT data was obtained and used for attenuation correction and anatomic localization. Fasting blood glucose: 86 mg/dl COMPARISON:  10/07/2020 FINDINGS: Mediastinal blood pool activity: SUV max 2.2 Liver activity: SUV max NA NECK: Left oropharyngeal hypermetabolism and mild soft tissue fullness. Example at 9 mm and a S.U.V. max of 5.6 on 18/4. Compare 1.5 cm and a S.U.V. max of 7.6 on the prior exam. No cervical nodal hypermetabolism. Incidental CT findings: No cervical adenopathy. CHEST: Right subpectoral hypermetabolic foci. The more medial measures a S.U.V.  max of 4.3 on 60/4 versus a S.U.V. max of 15.5 on the prior exam. The more inferior and lateral, corresponding to a presumed node, measures 2.5 cm and a S.U.V. max of 8.6 on 66/4. Compare similar in size and a S.U.V. max of 10.5 on the prior exam. Hypermetabolic soft tissue mass about the right side of the sternum and internal mammary station. This measures on the order of 2.8 cm and a S.U.V. max of 5.7 on 85/4 versus similar in size and a S.U.V. max of 12.1 on the prior exam. Just superior and lateral to this, contiguous or adjacent soft tissue fullness measures a S.U.V. max of 5.2 today versus a S.U.V. max of 18.3 on the prior. Incidental CT findings: Mild cardiomegaly. Left Port-A-Cath tip high right atrium. Bilateral mastectomy. ABDOMEN/PELVIS: Segment 3 metastasis measures 9 mm and a S.U.V. max of 4.9 on 118/4 versus 13 mm and a S.U.V. max of 5.9 on the prior exam. No new hypermetabolic liver lesions. A porta hepatis hypermetabolic node measures 11 mm and a S.U.V. max of 5.5 on 116/4, new. Incidental CT findings: Normal adrenal glands. Abdominal aortic atherosclerosis. Large colonic stool burden. Mild pelvic  floor laxity. SKELETON: Diffuse marrow hypermetabolism. No correlate osseous lesion. Incidental CT findings: none IMPRESSION: 1. Primarily response to therapy as evidenced by decreased hypermetabolism and similar to decreased size of right thoracic and isolated liver metastasis. 2. New hypermetabolic porta hepatis node, consistent with a single site of progressive disease. 3. Diffuse marrow hypermetabolism, favored to be due to stimulation by chemotherapy. This decreases sensitivity for osseous metastasis. 4. Previously described left oropharyngeal hypermetabolic soft tissue density lesion is decreased in size and hypermetabolism, nonspecific. Electronically Signed   By: Abigail Miyamoto M.D.   On: 12/09/2020 14:04     Assessment and plan- Patient is a 65 y.o. female with metastatic triple negative  metaplastic breast cancer with lymph node bone and liver metastases here for on treatment assessment prior to cycle 4-day 1 of carboplatin and gemcitabine chemotherapy and to discuss PET scan results  I have reviewed PET CT scan images independently and discussed findings with the patient.PET/CT showed overall decrease in the size of the disease burden as evidenced by reduction in the SUV uptake in the subpectorally as well as soft tissue sternal max.  Hypermetabolic Szymon liver lesions is also lower.  However she was noted to have a new porta hepatis 11 mm hypermetabolic node with an SUV of 5.5 which was a single site of progression.  Based on these findings I would like to continue carboplatin and gemcitabine 2 weeks on 1 week off at this time.  However I would like to add Keytruda to the present regimen with a CPS score of 95 pembrolizumab was approved in combination with chemotherapy and metastatic triple negative breast cancer keynote 355 trial in the upfront setting.  We will await insurance approval and plan to add Keytruda to her next week chemo.  Discussed risks and benefits of Keytruda including all but not limited to autoimmune side effect such as autoimmune colitis, thyroiditis, pneumonitis, endocrinopathies and need to monitor kidney and liver functions.  Treatment will be given with a palliative intent.  Patient understands and agrees to proceed as planned  Counts are otherwise okay to proceed with cycle 1 of carboplatin and gemcitabine chemotherapy today.  She will directly proceed for [employer] next week and I will see her back in 3 weeks for cycle 5-day 1 of carboplatin and gemcitabine  Right upper extremity pain and swelling: Likely secondary to lymphedema.  I did speak to Luna Fuse from OT today.  The concern with lymphedema pump is lymphatic spread of malignancy.  She would probably benefit from a nighttime lymphedema sleeve and Gwenette Greet will see her for an evaluation for the  same.  She will continue as needed Dilaudid and gabapentin in the meanwhile  Patient reports abdominal bloating and weight gain PET scan did not show any overt ascites.  She did have a large colonic stool burden noted on PET scan which can also contribute to bloating.  She is continuing bowel regimen   Visit Diagnosis 1. Encounter for antineoplastic chemotherapy   2. Metastatic breast cancer ([place])   3. Neoplasm related pain   4. Encounter for antineoplastic immunotherapy      Dr. Randa Evens, MD, MPH [facility] [phone] 12/14/2020 9:17 AM

## 2020-12-14 NOTE — Progress Notes (Signed)
Pt having diarrhea some and she is on trulance and miralax and sennakot. I told her to hold off on miralax and see if that thelps and then if you need more help with a stool try 1/2 dose miralax. Eating and drinking good. She explains her pain like shoulder bone pain and radiates to arm all the way to fingers

## 2020-12-14 NOTE — Progress Notes (Signed)
Radiation Oncology Follow up Note  Name: Debra Little   Date:   12/14/2020 MRN:  [id number] DOB: 26-Jan-1956    This 65 y.o. female presents to the clinic today for 24-month follow-up status post right chest wall and peripheral lymphatic radiation therapy for stage IIIb (T2 N1 M0) triple negative invasive mammary carcinoma with metaplastic features status post neoadjuvant chemotherapy followed by modified radical mastectomy.  REFERRING PROVIDER: Tonia Ghent, MD  HPI: Patient is a 65 year old female now about 6 months having completed radiation therapy to her right chest wall and peripheral lymphatics.  She had stage IIIb triple negative disease.[name]  Her initial tumor was 3.5 cm in greatest dimension with multiple lymph node involvement with extra capsular spread.  Lymph vascular invasion was present.  Patient had 3 cycles of neoadjuvant AC along with Keytruda.  No significant response in her tumor was noted she underwent bilateral mastectomies with final path showing a 3.5 cm metaplastic triple negative carcinoma 7 lymph nodes were positive for malignancy with extranodal extension.  She completed postmastectomy chest wall and peripheral emphatic radiation.  PET scan already in November 2021 showed extensive recurrent right breast cancer with right chest wall involvement.  She also was noted to have hepatic metastasis.  Recent treatment has included carboplatin and gemcitabine showing some response on PET/CT.  There is some progression in her liver however.  She is scheduled to start Tequesta.  She is having some right upper extremity pain may be due to his lymphedema is being referred to occupational therapy who is probably recommending a sleeve.  She is otherwise without complaint.  COMPLICATIONS OF TREATMENT: none  FOLLOW UP COMPLIANCE: keeps appointments   PHYSICAL EXAM:  There were no vitals taken for this visit. Patient is status post bilateral mastectomies.  No palpable masses noted in  either chest wall no axillary or supraclavicular adenopathy is identified.  Lymphedema in her right upper extremity appears minimal.  Well-developed well-nourished patient in NAD. HEENT reveals PERLA, EOMI, discs not visualized.  Oral cavity is clear. No oral mucosal lesions are identified. Neck is clear without evidence of cervical or supraclavicular adenopathy. Lungs are clear to A&P. Cardiac examination is essentially unremarkable with regular rate and rhythm without murmur rub or thrill. Abdomen is benign with no organomegaly or masses noted. Motor sensory and DTR levels are equal and symmetric in the upper and lower extremities. Cranial nerves II through XII are grossly intact. Proprioception is intact. No peripheral adenopathy or edema is identified. No motor or sensory levels are noted. Crude visual fields are within normal range.  RADIOLOGY RESULTS: PET CT scans are reviewed in serial fashion as well as MRI of her brain showing no evidence of intracranial disease  PLAN: Present time patient is under active treatment for recurrent triple negative metaplastic carcinoma of the right breast.  I see no areas now for benefit of palliative radiation therapy.  I have asked to see her back in 6 months.  Would be happy to reevaluate the patient anytime should further palliative treatment be indicated.  Patient knows to call with any concerns she continues active treatment with medical oncology.  I would like to take this opportunity to thank you for allowing me to participate in the care of your patient.Noreene Filbert, MD

## 2020-12-20 ENCOUNTER — Ambulatory Visit: Payer: Medicare Other | Admitting: Occupational Therapy

## 2020-12-21 ENCOUNTER — Inpatient Hospital Stay: Payer: Medicare Other

## 2020-12-21 VITALS — BP 122/60 | HR 85 | Temp 97.0°F | Resp 17 | Wt 164.4 lb

## 2020-12-21 DIAGNOSIS — C50811 Malignant neoplasm of overlapping sites of right female breast: Secondary | ICD-10-CM | POA: Diagnosis not present

## 2020-12-21 DIAGNOSIS — Z5111 Encounter for antineoplastic chemotherapy: Secondary | ICD-10-CM

## 2020-12-21 DIAGNOSIS — Z171 Estrogen receptor negative status [ER-]: Secondary | ICD-10-CM | POA: Diagnosis not present

## 2020-12-21 DIAGNOSIS — R5383 Other fatigue: Secondary | ICD-10-CM | POA: Diagnosis not present

## 2020-12-21 DIAGNOSIS — R5381 Other malaise: Secondary | ICD-10-CM | POA: Diagnosis not present

## 2020-12-21 DIAGNOSIS — Z79899 Other long term (current) drug therapy: Secondary | ICD-10-CM | POA: Diagnosis not present

## 2020-12-21 DIAGNOSIS — K769 Liver disease, unspecified: Secondary | ICD-10-CM | POA: Diagnosis not present

## 2020-12-21 DIAGNOSIS — C50511 Malignant neoplasm of lower-outer quadrant of right female breast: Secondary | ICD-10-CM

## 2020-12-21 DIAGNOSIS — Z5112 Encounter for antineoplastic immunotherapy: Secondary | ICD-10-CM | POA: Diagnosis not present

## 2020-12-21 DIAGNOSIS — C50919 Malignant neoplasm of unspecified site of unspecified female breast: Secondary | ICD-10-CM

## 2020-12-21 DIAGNOSIS — Z803 Family history of malignant neoplasm of breast: Secondary | ICD-10-CM | POA: Diagnosis not present

## 2020-12-21 DIAGNOSIS — G893 Neoplasm related pain (acute) (chronic): Secondary | ICD-10-CM | POA: Diagnosis not present

## 2020-12-21 DIAGNOSIS — Z5189 Encounter for other specified aftercare: Secondary | ICD-10-CM | POA: Diagnosis not present

## 2020-12-21 LAB — CBC WITH DIFFERENTIAL/PLATELET
Abs Immature Granulocytes: 0.06 10*3/uL (ref 0.00–0.07)
Basophils Absolute: 0 10*3/uL (ref 0.0–0.1)
Basophils Relative: 1 %
Eosinophils Absolute: 0.1 10*3/uL (ref 0.0–0.5)
Eosinophils Relative: 1 %
HCT: 29.6 % — ABNORMAL LOW (ref 36.0–46.0)
Hemoglobin: 9.7 g/dL — ABNORMAL LOW (ref 12.0–15.0)
Immature Granulocytes: 1 %
Lymphocytes Relative: 9 %
Lymphs Abs: 0.7 10*3/uL (ref 0.7–4.0)
MCH: 29.8 pg (ref 26.0–34.0)
MCHC: 32.8 g/dL (ref 30.0–36.0)
MCV: 90.8 fL (ref 80.0–100.0)
Monocytes Absolute: 1.1 10*3/uL — ABNORMAL HIGH (ref 0.1–1.0)
Monocytes Relative: 14 %
Neutro Abs: 5.7 10*3/uL (ref 1.7–7.7)
Neutrophils Relative %: 74 %
Platelets: 193 10*3/uL (ref 150–400)
RBC: 3.26 MIL/uL — ABNORMAL LOW (ref 3.87–5.11)
RDW: 18.1 % — ABNORMAL HIGH (ref 11.5–15.5)
WBC: 7.6 10*3/uL (ref 4.0–10.5)
nRBC: 0 % (ref 0.0–0.2)

## 2020-12-21 LAB — COMPREHENSIVE METABOLIC PANEL
ALT: 53 U/L — ABNORMAL HIGH (ref 0–44)
AST: 32 U/L (ref 15–41)
Albumin: 3.5 g/dL (ref 3.5–5.0)
Alkaline Phosphatase: 103 U/L (ref 38–126)
Anion gap: 9 (ref 5–15)
BUN: 13 mg/dL (ref 8–23)
CO2: 27 mmol/L (ref 22–32)
Calcium: 8.9 mg/dL (ref 8.9–10.3)
Chloride: 101 mmol/L (ref 98–111)
Creatinine, Ser: 0.61 mg/dL (ref 0.44–1.00)
GFR, Estimated: >60 mL/min (ref >60)
Glucose, Bld: 110 mg/dL — ABNORMAL HIGH (ref 70–99)
Potassium: 4 mmol/L (ref 3.5–5.1)
Sodium: 137 mmol/L (ref 135–145)
Total Bilirubin: 0.4 mg/dL (ref 0.3–1.2)
Total Protein: 7 g/dL (ref 6.5–8.1)

## 2020-12-21 LAB — TSH: TSH: 2.24 u[IU]/mL (ref 0.350–4.500)

## 2020-12-21 MED ORDER — DEXAMETHASONE SODIUM PHOSPHATE 10 MG/ML IJ SOLN
4.0000 mg | Freq: Once | INTRAMUSCULAR | Status: AC
  Administered 2020-12-21: 4 mg via INTRAVENOUS
  Filled 2020-12-21: qty 1

## 2020-12-21 MED ORDER — HEPARIN SOD (PORK) LOCK FLUSH 100 UNIT/ML IV SOLN
INTRAVENOUS | Status: AC
  Filled 2020-12-21: qty 5

## 2020-12-21 MED ORDER — SODIUM CHLORIDE 0.9% FLUSH
10.0000 mL | INTRAVENOUS | Status: DC | PRN
  Administered 2020-12-21: 10 mL via INTRAVENOUS
  Filled 2020-12-21: qty 10

## 2020-12-21 MED ORDER — HEPARIN SOD (PORK) LOCK FLUSH 100 UNIT/ML IV SOLN
500.0000 [IU] | Freq: Once | INTRAVENOUS | Status: AC
  Administered 2020-12-21: 500 [IU] via INTRAVENOUS
  Filled 2020-12-21: qty 5

## 2020-12-21 MED ORDER — SODIUM CHLORIDE 0.9 % IV SOLN
1800.0000 mg | Freq: Once | INTRAVENOUS | Status: AC
Start: 2020-12-21 — End: 2020-12-21
  Administered 2020-12-21: 1800 mg via INTRAVENOUS
  Filled 2020-12-21: qty 26.3

## 2020-12-21 MED ORDER — SODIUM CHLORIDE 0.9 % IV SOLN
200.0000 mg | Freq: Once | INTRAVENOUS | Status: AC
  Administered 2020-12-21: 200 mg via INTRAVENOUS
  Filled 2020-12-21: qty 8

## 2020-12-21 MED ORDER — SODIUM CHLORIDE 0.9 % IV SOLN: 4.0000 mg | Freq: Once | INTRAVENOUS | Status: DC

## 2020-12-21 MED ORDER — SODIUM CHLORIDE 0.9 % IV SOLN
Freq: Once | INTRAVENOUS | Status: AC
  Filled 2020-12-21: qty 250

## 2020-12-21 MED ORDER — SODIUM CHLORIDE 0.9 % IV SOLN
210.0000 mg | Freq: Once | INTRAVENOUS | Status: AC
  Administered 2020-12-21: 210 mg via INTRAVENOUS
  Filled 2020-12-21: qty 21

## 2020-12-21 MED ORDER — PALONOSETRON HCL INJECTION 0.25 MG/5ML
0.2500 mg | Freq: Once | INTRAVENOUS | Status: AC
  Administered 2020-12-21: 0.25 mg via INTRAVENOUS
  Filled 2020-12-21: qty 5

## 2020-12-21 NOTE — Progress Notes (Signed)
pt states that when receiving Gemzar (everytime not just this time) she experiences pain across the top of her chest and along the right upper chest, and sometimes into her shoulders and upper back, pt denies any other symptoms, no s/s of distress noted. Dr. Janese Banks aware, no new orders at this time, proceed with treatment.   1240: Pt tolerated infusion well, no s/s of distress noted. Pt stable at discharge.

## 2020-12-22 ENCOUNTER — Encounter: Payer: Self-pay | Admitting: Occupational Therapy

## 2020-12-22 ENCOUNTER — Ambulatory Visit: Payer: Medicare Other | Attending: Oncology | Admitting: Occupational Therapy

## 2020-12-22 ENCOUNTER — Other Ambulatory Visit: Payer: Self-pay

## 2020-12-22 DIAGNOSIS — I972 Postmastectomy lymphedema syndrome: Secondary | ICD-10-CM | POA: Diagnosis not present

## 2020-12-22 NOTE — Patient Instructions (Signed)
Pt to wear her night time Tribute sleeve and glove as much as she can during day - off am and pm 2 hrs - night time decrease  Her daytime sleeve to small and cause pain  If going out can use isotoner glove , tubigrip D on hand to elbow and E forearm to axilla  For week and follow up

## 2020-12-22 NOTE — Therapy (Signed)
[facility] [address], [place], [address] Phone: [phone]   Fax:  [phone]  Occupational Therapy Evaluation  Patient Details  Name: Debra Little MRN: [id number] Date of Birth: 11/22/1956 Referring Provider (OT): Dr Janese Banks   Encounter Date: 12/22/2020   OT End of Session - 12/22/20 1723    Visit Number 1    Number of Visits 12    Date for OT Re-Evaluation 02/02/21    OT Start Time 1448    OT Stop Time 1541    OT Time Calculation (min) 53 min    Activity Tolerance Patient tolerated treatment well    Behavior During Therapy [facility] for tasks assessed/performed           Past Medical History:  Diagnosis Date  . Anemia    d/t chemo - last Hgb 10.8  . Anxiety    sees Dr. [name]  . BRCA negative    Inv[employer]ae panel neg except CHEK2 VUS  . Breast cancer ([place]) 2020  . Cyst of breast    per [name]  . Dysrhythmia    Hx of palpitations  . Family history of breast cancer   . Family history of colon cancer   . Family history of leukemia   . Family history of ovarian cancer   . Fibromyalgia   . GERD (gastroesophageal reflux disease)   . Heart murmur   . Hemorrhoids   . Herpes, genital 04/2015   confirmed with HSV 2 IgG  . Hiatal hernia   . Hypercholesterolemia    Dr. Kyra Searles  . Hyperlipidemia   . IBS (irritable bowel syndrome)    constipation predominant  . IC (interstitial cystitis)    per [facility]  . Mild depression ([place])   . MVP (mitral valve prolapse)    [facility] cards eval 2012  . Osteopenia    2010/2017, DEXA at [facility];spine and fem neck  . PONV (postoperative nausea and vomiting)   . Vitamin D deficiency    history of    Past Surgical History:  Procedure Laterality Date  . ABDOMINAL HYSTERECTOMY    . APPENDECTOMY    . BLADDER SURGERY     1980's  . BREAST EXCISIONAL BIOPSY Right    benign  . BREAST EXCISIONAL BIOPSY Bilateral    benign  . COLONOSCOPY  09/2014    Dr. [name]  . COLONOSCOPY  2009   at [facility] 1 POLYP (BENIGN)  . excision of breast cysts     hx of multiple cyst aspirations  . EXPLORATORY LAPAROTOMY    . MASTECTOMY MODIFIED RADICAL Bilateral 10/09/2019   Procedure: RIGHT MASTECTOMY MODIFIED RADICAL AND LEFT TOTAL MASTECTOMY;  Surgeon: Rolm Bookbinder, MD;  Location: [facility];  Service: General;  Laterality: Bilateral;  . PORTA CATH INSERTION N/A 08/06/2019   Procedure: PORTA CATH INSERTION;  Surgeon: Katha Cabal, MD;  Location: [facility];  Service: Cardiovascular;  Laterality: N/A;  . PORTACATH PLACEMENT Left 08/05/2019   Procedure: INSERTION PORT-A-CATH, Attempted;  Surgeon: Jules Husbands, MD;  Location: [facility];  Service: General;  Laterality: Left;  . TUBAL LIGATION      There were no vitals filed for this visit.   Subjective Assessment - 12/22/20 1712    Subjective  I could not wear any of my compression since last year- daytime one to tight and hurting me and night time one did not try yet - it it so hot to wear at  night time - and they bother my upper arm or axilla if they touch that    Pertinent History Patient is a 65 year old female with seen [name] in the past and has undergone breast biopsies which did not previously showed malignancy.  More recently patient noted some red discoloration around her right perioral as well as a palpable mass which led to a diagnostic mammogram on the right side her prior mammogram in February 2020 was normal in August 2020 she was noted to have a 2.7 x 2 x 2.3 cm mass in her right breast along with 4 morphologically abnormal lymph nodes.  She underwent breast biopsy as well as lymph node biopsy which showed grade 3 invasive mammary carcinoma.  ER PR and HER-2/neu negative.  Sections show high-grade invasive carcinoma with focal squamous differentiation and pinpoint keratin ideation.  The features are concerning for metaplastic differentiation.[name]  Lymphovascular invasion present.  There  was extranodal extension present on the lymph node biopsy.         MRI of bilateral breasts showed:   Primary right breast mass was 3.5 x 2.7 x 3.4 cm.  There were surrounding numerous nodules consistent with satellite lesions.  Extensive nodular non-mass enhancement throughout the right breast involving the upper and lower quadrants spanning 6.2 x 4.5 x 5.5 cm.  Markedly enhancement of the left breast diffusely.  Non-mass enhancement measures 1.9 x 2.4 cm.  At least 3 abnormal lymph nodes in the right axilla.  No abnormal left axillary lymph nodes.  Noted to have ER positive DCIS in her left breast on biopsy   Patient had 3 cycles of neoadjuvant AC chemotherapy along with Keytruda.  There was no significant response in her tumor and patient proceeded with bilateral mastectomy with targeted node dissection.  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  7 lymph nodes were positive for malignancy with extranodal extension overall cancer cellularity was 40% [id number].  Patient completed 1 cycle of adjuvant AC chemotherapy and will complete weekly Taxol chemotherapy in April 2021. She completed post mastectomy radiation     PET scan in November 2021 was done for ongoing chest wall pain.  It showed:1. Aggressive appearing and extensive recurrent right breast cancer  with right chest wall involvement as detailed above.  2. No findings for mediastinal/hilar metastatic lymphadenopathy or  metastatic pulmonary disease.  3. Single left hepatic lobe liver lesion consistent with metastatic  focus.  4. Hypermetabolic soft tissue mass in the left oropharynx/tonsillar  region suspicious for primary neoplasm.    Patient Stated Goals I want my R arm smaller - like the other one - they way it was    Currently in Pain? Yes    Pain Score 8     Pain Location Arm   R chest   Pain Orientation Right    Pain Descriptors / Indicators Aching;Burning   itchy   Pain Type Acute pain;Neuropathic pain    Pain Onset  More than a month ago    Pain Frequency Constant             [facility] OT Assessment - 12/22/20 0001      Assessment   Medical Diagnosis R UE  lymphedema    Referring Provider (OT) Dr Janese Banks    Onset Date/Surgical Date 10/28/19   10/25/20 recurrent breast CA   Hand Dominance Right    Prior Therapy --   2020 and 2021     Precautions   Precaution Comments R UE lymphedema  Home  Environment   Lives With Spouse      Prior Function   Leisure light house work, watch tv, read , walk some if can      AROM   Right Shoulder Flexion 95 Degrees   pain axilla   Right Shoulder ABduction 85 Degrees   pain axilla   Right Shoulder Internal Rotation --   [facility]   Right Shoulder External Rotation --   impaired - pain axilla           LYMPHEDEMA/ONCOLOGY QUESTIONNAIRE - 12/22/20 0001      Right Upper Extremity Lymphedema   15 cm Proximal to Olecranon Process 33 cm    10 cm Proximal to Olecranon Process 30 cm    Olecranon Process 26 cm    15 cm Proximal to Ulnar Styloid Process 25 cm    10 cm Proximal to Ulnar Styloid Process 22.5 cm    Just Proximal to Ulnar Styloid Process 16 cm    Across Hand at [employer] 18 cm    At [place] of 2nd Digit 6.1 cm    At [facility] of Thumb 6 cm      Left Upper Extremity Lymphedema   15 cm Proximal to Olecranon Process 29 cm    10 cm Proximal to Olecranon Process 26.8 cm    Olecranon Process 23 cm    15 cm Proximal to Ulnar Styloid Process 21.3 cm    10 cm Proximal to Ulnar Styloid Process 18.5 cm    Just Proximal to Ulnar Styloid Process 19 cm    Across Hand at [employer] 17 cm    At [place] of 2nd Digit 6 cm    At [facility] of Thumb 5.8 cm              Pt brought in her daytime [employer] elvarex soft sleeve and glove - she stopped wearing in in Nov/Dec last year - causing pain and her circumference increased  Report after her chemo her arm is fuller and tighter and more swollen Brought her night time sleeve -did not wear it since last year- to hot and  cause pain    Pt to wear her night time Tribute sleeve and glove as much as she can during day - off am and pm 2 hrs - night time decrease  - assess fit and ed pt on wearing correctly and adjust it  Her daytime sleeve to small and cause pain at the moment If going out can use isotoner glove , tubigrip D on hand to elbow and E forearm to axilla  For week and follow up           OT Education - 12/22/20 1723    Education Details findings of eval and HEP    Person(s) Educated Patient    Methods Explanation;Demonstration;Tactile cues;Verbal cues    Comprehension Verbal cues required;Returned demonstration;Verbalized understanding               OT Long Term Goals - 12/22/20 1729      OT LONG TERM GOAL #1   Title Pt to be independent in HEP to decrease R UE circumference decrease in upper arm,elbow and forearm by more than 2 cm to fit in correct daytime compression without pain    Baseline increase compare to L by 4 cm in upper arm and forearm, elbow 3 mc , wrist 3cm and hand 1 cm    Time 3    Period Weeks  Status New    Target Date 01/12/21      OT LONG TERM GOAL #2   Title Pt to be independent in wearing correct night time and daytime compressoin sleeve to maintain her R UE lymphedema without increase symptoms    Baseline daytime compression to smalle and causing pain - night time pt did not wear since she got it -to hot and pain in upper arm and axilla    Time 6    Period Weeks    Status New    Target Date 02/02/21                 Plan - 12/22/20 1723    Clinical Impression Statement Pt refer to OT for worsening of her R UE lymphedema - she had recurrent breast CA with metastasis - going thru chemo since Nov 2021 - unable to wear her compression sleeve causing pain per pt - measurement compare to last year did increase - compare to L side upper arm and forearm increase by 4 cm , elbow 3 cm and wrist 3 cm - hand 1 cm - pt report some numbness in fingers with  certain activities - like eating - when doing grip with wrist extention - pt fitted with her night time Tribute sleeve and glove - to wear that most all the time for week to decrease her circumference -will remeasure her in week - night time compression decrease - daytime compression maintain - pt show decrease shoulder AROM , increase pain and increase lymphedema circumference - pt can benefit from OT services to decrease circumference and fitting pt with correct compression and homeprogram to maintain her lymphedema in R UE    OT Occupational Profile and History Problem Focused Assessment - Including review of records relating to presenting problem    Occupational performance deficits (Please refer to evaluation for details): ADL's;IADL's;Play;Leisure;Rest and Sleep    Body Structure / Function / Physical Skills Edema;Strength;ROM;UE functional use;Flexibility;Decreased knowledge of precautions;ADL;IADL;Pain    Rehab Potential Good    Clinical Decision Making Limited treatment options, no task modification necessary    Comorbidities Affecting Occupational Performance: May have comorbidities impacting occupational performance    Modification or Assistance to Complete Evaluation  No modification of tasks or assist necessary to complete eval    OT Frequency 1x / week    OT Duration 6 weeks    OT Treatment/Interventions Self-care/ADL training;Manual lymph drainage;Patient/family education;Compression bandaging;Manual Therapy;DME and/or AE instruction    Consulted and Agree with Plan of Care Patient           Patient will benefit from skilled therapeutic intervention in order to improve the following deficits and impairments:   Body Structure / Function / Physical Skills: Edema,Strength,ROM,UE functional use,Flexibility,Decreased knowledge of precautions,ADL,IADL,Pain       Visit Diagnosis: Postmastectomy lymphedema syndrome - Plan: Ot plan of care cert/re-cert    Problem List Patient  Active Problem List   Diagnosis Date Noted  . Ductal carcinoma in situ (DCIS) of left breast 03/04/2020  . Bilateral breast cancer ([place]) 10/09/2019  . Genetic testing 08/27/2019  . Family history of breast cancer   . Family history of ovarian cancer   . Family history of colon cancer   . Family history of leukemia   . Goals of care, counseling/discussion 08/07/2019  . Breast cancer ([place]) 07/30/2019  . GERD (gastroesophageal reflux disease) 06/30/2019  . Abdominal discomfort 05/15/2019  . Advance care planning 07/14/2018  . Health care maintenance 07/14/2018  .  Depression, recurrent ([place]) 07/14/2018  . Constipation 07/14/2018  . Altered taste 07/14/2018  . Fibrocystic breast changes of both breasts 01/24/2018  . Vasomotor symptoms due to menopause 09/05/2017  . Herpes simplex vulvovaginitis 09/05/2017  . Skin nodule 05/25/2017  . Encounter for screening examination for infectious disease 03/18/2017  . Lipoma 03/18/2017  . Vaginitis 03/18/2017  . Radicular pain in left arm 07/27/2015  . Neuralgia and neuritis, unspecified 07/27/2015  . Fatigue 03/05/2015  . B12 deficiency 03/05/2015  . Skin rash 05/01/2014  . MVP (mitral valve prolapse) 04/30/2014  . Cyst of breast 04/05/2014  . Interstitial cystitis 03/31/2014  . Routine general medical examination at a health care facility 11/13/2011  . Anxiety and depression 10/06/2010  . BREAST CYSTS, BILATERAL 12/03/2008  . PALPITATIONS, CHRONIC 12/03/2008  . Vitamin D deficiency 09/10/2008  . HLD (hyperlipidemia) 09/10/2008  . IRRITABLE BOWEL SYNDROME 02/12/2008    Rosalyn Gess OTR/l,CLT 12/22/2020, 5:36 PM  [facility] [address], [place], [address] Phone: [phone]   Fax:  [phone]  Name: Debra Little MRN: [id number] Date of Birth: 1956/10/27

## 2020-12-23 DIAGNOSIS — H902 Conductive hearing loss, unspecified: Secondary | ICD-10-CM | POA: Diagnosis not present

## 2020-12-23 DIAGNOSIS — H6123 Impacted cerumen, bilateral: Secondary | ICD-10-CM | POA: Diagnosis not present

## 2020-12-28 ENCOUNTER — Ambulatory Visit: Payer: Medicare Other | Attending: Oncology | Admitting: Occupational Therapy

## 2020-12-28 ENCOUNTER — Other Ambulatory Visit: Payer: Self-pay

## 2020-12-28 DIAGNOSIS — I972 Postmastectomy lymphedema syndrome: Secondary | ICD-10-CM | POA: Diagnosis not present

## 2020-12-28 NOTE — Therapy (Signed)
[facility] [address], [place], [address] Phone: [phone]   Fax:  [phone]  Occupational Therapy Treatment  Patient Details  Name: Debra Little MRN: [id number] Date of Birth: [birth year] Referring Provider (OT): Dr Janese Banks   Encounter Date: 12/28/2020   OT End of Session - 12/28/20 1427    Visit Number 2    Number of Visits 12    Date for OT Re-Evaluation 02/02/21    OT Start Time 1346    OT Stop Time 1422    OT Time Calculation (min) 36 min    Activity Tolerance Patient tolerated treatment well    Behavior During Therapy [facility] for tasks assessed/performed           Past Medical History:  Diagnosis Date  . Anemia    d/t chemo - last Hgb 10.8  . Anxiety    sees Dr. [name]  . BRCA negative    [employer]itae panel neg except CHEK2 VUS  . Breast cancer ([place]) 2020  . Cyst of breast    per [name]  . Dysrhythmia    Hx of palpitations  . Family history of breast cancer   . Family history of colon cancer   . Family history of leukemia   . Family history of ovarian cancer   . Fibromyalgia   . GERD (gastroesophageal reflux disease)   . Heart murmur   . Hemorrhoids   . Herpes, genital 04/2015   confirmed with HSV 2 IgG  . Hiatal hernia   . Hypercholesterolemia    Dr. Kyra Searles  . Hyperlipidemia   . IBS (irritable bowel syndrome)    constipation predominant  . IC (interstitial cystitis)    per [place] [facility]  . Mild depression ([place])   . MVP (mitral valve prolapse)    [facility] cards eval 2012  . Osteopenia    2010/2017, DEXA at [facility];spine and fem neck  . PONV (postoperative nausea and vomiting)   . Vitamin D deficiency    history of    Past Surgical History:  Procedure Laterality Date  . ABDOMINAL HYSTERECTOMY    . APPENDECTOMY    . BLADDER SURGERY     1980's  . BREAST EXCISIONAL BIOPSY Right    benign  . BREAST EXCISIONAL BIOPSY Bilateral    benign  . COLONOSCOPY  09/2014   Dr.  [name]  . COLONOSCOPY  2009   [facility] POLYP (BENIGN)  . excision of breast cysts     hx of multiple cyst aspirations  . EXPLORATORY LAPAROTOMY    . MASTECTOMY MODIFIED RADICAL Bilateral 10/09/2019   Procedure: RIGHT MASTECTOMY MODIFIED RADICAL AND LEFT TOTAL MASTECTOMY;  Surgeon: Rolm Bookbinder, MD;  Location: [facility];  Service: General;  Laterality: Bilateral;  . PORTA CATH INSERTION N/A 08/06/2019   Procedure: PORTA CATH INSERTION;  Surgeon: Katha Cabal, MD;  Location: [place] [facility];  Service: Cardiovascular;  Laterality: N/A;  . PORTACATH PLACEMENT Left 08/05/2019   Procedure: INSERTION PORT-A-CATH, Attempted;  Surgeon: Jules Husbands, MD;  Location: [facility];  Service: General;  Laterality: Left;  . TUBAL LIGATION      There were no vitals filed for this visit.   Subjective Assessment - 12/28/20 1426    Subjective  I wore the night time one - night time and about 80% of the day time - it got hot- by I kept it on- I can feel my bone at elbow now and  forearm looks better - not as swollen    Pertinent History Patient is a 65 year old female with seen [name] in the past and has undergone breast biopsies which did not previously showed malignancy.  More recently patient noted some red discoloration around her right perioral as well as a palpable mass which led to a diagnostic mammogram on the right side her prior mammogram in February 2020 was normal in August 2020 she was noted to have a 2.7 x 2 x 2.3 cm mass in her right breast along with 4 morphologically abnormal lymph nodes.  She underwent breast biopsy as well as lymph node biopsy which showed grade 3 invasive mammary carcinoma.  ER PR and HER-2/neu negative.  Sections show high-grade invasive carcinoma with focal squamous differentiation and pinpoint keratin ideation.  The features are concerning for metaplastic differentiation.[name]  Lymphovascular invasion present.  There was extranodal extension present on the lymph node  biopsy.         MRI of bilateral breasts showed:   Primary right breast mass was 3.5 x 2.7 x 3.4 cm.  There were surrounding numerous nodules consistent with satellite lesions.  Extensive nodular non-mass enhancement throughout the right breast involving the upper and lower quadrants spanning 6.2 x 4.5 x 5.5 cm.  Markedly enhancement of the left breast diffusely.  Non-mass enhancement measures 1.9 x 2.4 cm.  At least 3 abnormal lymph nodes in the right axilla.  No abnormal left axillary lymph nodes.  Noted to have ER positive DCIS in her left breast on biopsy   Patient had 3 cycles of neoadjuvant AC chemotherapy along with Keytruda.  There was no significant response in her tumor and patient proceeded with bilateral mastectomy with targeted node dissection.  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  7 lymph nodes were positive for malignancy with extranodal extension overall cancer cellularity was 40% [id number].  Patient completed 1 cycle of adjuvant AC chemotherapy and will complete weekly Taxol chemotherapy in April 2021. She completed post mastectomy radiation     PET scan in November 2021 was done for ongoing chest wall pain.  It showed:1. Aggressive appearing and extensive recurrent right breast cancer  with right chest wall involvement as detailed above.  2. No findings for mediastinal/hilar metastatic lymphadenopathy or  metastatic pulmonary disease.  3. Single left hepatic lobe liver lesion consistent with metastatic  focus.  4. Hypermetabolic soft tissue mass in the left oropharynx/tonsillar  region suspicious for primary neoplasm.    Patient Stated Goals I want my R arm smaller - like the other one - they way it was    Currently in Pain? No/denies               LYMPHEDEMA/ONCOLOGY QUESTIONNAIRE - 12/28/20 0001      Right Upper Extremity Lymphedema   15 cm Proximal to Olecranon Process 32.4 cm    10 cm Proximal to Olecranon Process 30 cm    Olecranon Process 25 cm     15 cm Proximal to Ulnar Styloid Process 24 cm    10 cm Proximal to Ulnar Styloid Process 20.8 cm    Just Proximal to Ulnar Styloid Process 16 cm    Across Hand at [employer] 18 cm    At [place] of 2nd Digit 6 cm    At [facility] of Thumb 5.9 cm           measurements taken -see flow sheet- R UE decrease close to 2 cm in forearm  and 1 cm proximal forearm, elbow and upper arm   Pt brought in her old [place] strong sleeve and glove in - but still to tight at her wrist- R wrist still increase 3 cm compare to L  And her  daytime Jobst elvarex soft sleeve and glove also still to tight    Adjusted tabs on her night time Tribute sleeve and glove  Because of decrease circumference since last week  Pt to cont to wear night sleeve as much as she can during day - off am and pm 2 hrs Fitted with new isotoner glove , tubigrip D on hand to elbow and E forearm to axilla  To wear when out and about  For week to do night time sleeve most all the time to decrease circumference and follow up in week again -hope she will be decrease so she can fit in her daytime Jobst Elvarex soft sleeve and glove  She do Report after her chemo her arm is fuller and tighter and more swollen- will address schedule of wearing compression next time               OT Education - 12/28/20 1427    Education Details wearing of compression and progress    Person(s) Educated Patient    Methods Explanation;Demonstration;Tactile cues;Verbal cues    Comprehension Verbal cues required;Returned demonstration;Verbalized understanding               OT Long Term Goals - 12/22/20 1729      OT LONG TERM GOAL #1   Title Pt to be independent in HEP to decrease R UE circumference decrease in upper arm,elbow and forearm by more than 2 cm to fit in correct daytime compression without pain    Baseline increase compare to L by 4 cm in upper arm and forearm, elbow 3 mc , wrist 3cm and hand 1 cm    Time 3    Period Weeks    Status  New    Target Date 01/12/21      OT LONG TERM GOAL #2   Title Pt to be independent in wearing correct night time and daytime compressoin sleeve to maintain her R UE lymphedema without increase symptoms    Baseline daytime compression to smalle and causing pain - night time pt did not wear since she got it -to hot and pain in upper arm and axilla    Time 6    Period Weeks    Status New    Target Date 02/02/21                 Plan - 12/28/20 1428    Clinical Impression Statement Pt was seen last week because of worsening  R UE lymphedema and increase circumference  after recurrent breast CA - and was not able to wear compression for while - she wore her night time Tribute sleeve and glove at night time and Tribute during day about 80% - did decrease in forearm close to 2 cm and proximal forearm to upper arm 1 cm - pt still icnrease by more than 3 cm in upper arm and 2.7 in forearm , 2 cm at elbow- readjusted her compression in Tribute for night time and glove night time - and to use it again for week at least 80% of  daytime - and if out and about do Tubigrip D and E - pt will follow up next Monday - day before her next chemo  OT Occupational Profile and History Problem Focused Assessment - Including review of records relating to presenting problem    Occupational performance deficits (Please refer to evaluation for details): ADL's;IADL's;Play;Leisure;Rest and Sleep    Body Structure / Function / Physical Skills Edema;Strength;ROM;UE functional use;Flexibility;Decreased knowledge of precautions;ADL;IADL;Pain    Rehab Potential Good    Clinical Decision Making Limited treatment options, no task modification necessary    Comorbidities Affecting Occupational Performance: May have comorbidities impacting occupational performance    Modification or Assistance to Complete Evaluation  No modification of tasks or assist necessary to complete eval    OT Frequency 1x / week    OT Duration 6 weeks     OT Treatment/Interventions Self-care/ADL training;Manual lymph drainage;Patient/family education;Compression bandaging;Manual Therapy;DME and/or AE instruction    Plan assses circumfernce, with use of pump- if she got new compression yet- and skin from radiation    Consulted and Agree with Plan of Care Patient           Patient will benefit from skilled therapeutic intervention in order to improve the following deficits and impairments:   Body Structure / Function / Physical Skills: Edema,Strength,ROM,UE functional use,Flexibility,Decreased knowledge of precautions,ADL,IADL,Pain       Visit Diagnosis: Postmastectomy lymphedema syndrome    Problem List Patient Active Problem List   Diagnosis Date Noted  . Ductal carcinoma in situ (DCIS) of left breast 03/04/2020  . Bilateral breast cancer ([place]) 10/09/2019  . Genetic testing 08/27/2019  . Family history of breast cancer   . Family history of ovarian cancer   . Family history of colon cancer   . Family history of leukemia   . Goals of care, counseling/discussion 08/07/2019  . Breast cancer ([place]) 07/30/2019  . GERD (gastroesophageal reflux disease) 06/30/2019  . Abdominal discomfort 05/15/2019  . Advance care planning 07/14/2018  . Health care maintenance 07/14/2018  . Depression, recurrent ([place]) 07/14/2018  . Constipation 07/14/2018  . Altered taste 07/14/2018  . Fibrocystic breast changes of both breasts 01/24/2018  . Vasomotor symptoms due to menopause 09/05/2017  . Herpes simplex vulvovaginitis 09/05/2017  . Skin nodule 05/25/2017  . Encounter for screening examination for infectious disease 03/18/2017  . Lipoma 03/18/2017  . Vaginitis 03/18/2017  . Radicular pain in left arm 07/27/2015  . Neuralgia and neuritis, unspecified 07/27/2015  . Fatigue 03/05/2015  . B12 deficiency 03/05/2015  . Skin rash 05/01/2014  . MVP (mitral valve prolapse) 04/30/2014  . Cyst of breast 04/05/2014  . Interstitial cystitis  03/31/2014  . Routine general medical examination at a health care facility 11/13/2011  . Anxiety and depression 10/06/2010  . BREAST CYSTS, BILATERAL 12/03/2008  . PALPITATIONS, CHRONIC 12/03/2008  . Vitamin D deficiency 09/10/2008  . HLD (hyperlipidemia) 09/10/2008  . IRRITABLE BOWEL SYNDROME 02/12/2008    Rosalyn Gess OTR/l,CLT 12/28/2020, 3:20 PM  [facility] [address], [place], [address] Phone: [phone]   Fax:  [phone]  Name: Debra Little MRN: [id number] Date of Birth: 1956/04/16

## 2021-01-03 ENCOUNTER — Ambulatory Visit: Payer: Medicare Other | Admitting: Occupational Therapy

## 2021-01-03 ENCOUNTER — Other Ambulatory Visit: Payer: Self-pay

## 2021-01-03 DIAGNOSIS — R079 Chest pain, unspecified: Secondary | ICD-10-CM | POA: Diagnosis not present

## 2021-01-03 DIAGNOSIS — I972 Postmastectomy lymphedema syndrome: Secondary | ICD-10-CM | POA: Diagnosis not present

## 2021-01-03 DIAGNOSIS — I341 Nonrheumatic mitral (valve) prolapse: Secondary | ICD-10-CM | POA: Diagnosis not present

## 2021-01-03 DIAGNOSIS — E782 Mixed hyperlipidemia: Secondary | ICD-10-CM | POA: Diagnosis not present

## 2021-01-03 DIAGNOSIS — D0512 Intraductal carcinoma in situ of left breast: Secondary | ICD-10-CM | POA: Diagnosis not present

## 2021-01-03 NOTE — Therapy (Signed)
[facility] [address], [place], [address] Phone: [phone]   Fax:  [phone]  Occupational Therapy Treatment  Patient Details  Name: Debra Little MRN: [id number] Date of Birth: 11-Mar-1956 Referring Provider (OT): Dr Janese Banks   Encounter Date: 01/03/2021   OT End of Session - 01/03/21 1146    Visit Number 3    Number of Visits 12    Date for OT Re-Evaluation 02/02/21    OT Start Time 1105    OT Stop Time 1130    OT Time Calculation (min) 25 min    Activity Tolerance Patient tolerated treatment well    Behavior During Therapy [facility] for tasks assessed/performed           Past Medical History:  Diagnosis Date  . Anemia    d/t chemo - last Hgb 10.8  . Anxiety    sees Dr. [name]  . BRCA negative    Invitae panel neg except CHEK2 VUS  . Breast cancer ([place]) 2020  . Cyst of breast    per [name]  . Dysrhythmia    Hx of palpitations  . Family history of breast cancer   . Family history of colon cancer   . Family history of leukemia   . Family history of ovarian cancer   . Fibromyalgia   . GERD (gastroesophageal reflux disease)   . Heart murmur   . Hemorrhoids   . Herpes, genital 04/2015   confirmed with HSV 2 IgG  . Hiatal hernia   . Hypercholesterolemia    Dr. Kyra Searles  . Hyperlipidemia   . IBS (irritable bowel syndrome)    constipation predominant  . IC (interstitial cystitis)    per [facility]  . Mild depression ([place])   . MVP (mitral valve prolapse)    [facility] cards eval 2012  . Osteopenia    2010/2017, DEXA at [facility];spine and fem neck  . PONV (postoperative nausea and vomiting)   . Vitamin D deficiency    history of    Past Surgical History:  Procedure Laterality Date  . ABDOMINAL HYSTERECTOMY    . APPENDECTOMY    . BLADDER SURGERY     1980's  . BREAST EXCISIONAL BIOPSY Right    benign  . BREAST EXCISIONAL BIOPSY Bilateral    benign  . COLONOSCOPY  09/2014   Dr.  [name]  . COLONOSCOPY  2009   at [facility] 1 POLYP (BENIGN)  . excision of breast cysts     hx of multiple cyst aspirations  . EXPLORATORY LAPAROTOMY    . MASTECTOMY MODIFIED RADICAL Bilateral 10/09/2019   Procedure: RIGHT MASTECTOMY MODIFIED RADICAL AND LEFT TOTAL MASTECTOMY;  Surgeon: Rolm Bookbinder, MD;  Location: [facility];  Service: General;  Laterality: Bilateral;  . PORTA CATH INSERTION N/A 08/06/2019   Procedure: PORTA CATH INSERTION;  Surgeon: Katha Cabal, MD;  Location: [facility];  Service: Cardiovascular;  Laterality: N/A;  . PORTACATH PLACEMENT Left 08/05/2019   Procedure: INSERTION PORT-A-CATH, Attempted;  Surgeon: Jules Husbands, MD;  Location: [facility];  Service: General;  Laterality: Left;  . TUBAL LIGATION      There were no vitals filed for this visit.   Subjective Assessment - 01/03/21 1144    Subjective  My arm feels better since we done the compression. Heaviness, pressure and acheness is better - but I feel like my arm is more swollen than last week and I did wear the night  one like we set in up - I know my arm feels swollen during chemo and 1-2 days afterwards    Pertinent History Patient is a 65 year old female with seen [name] in the past and has undergone breast biopsies which did not previously showed malignancy.  More recently patient noted some red discoloration around her right perioral as well as a palpable mass which led to a diagnostic mammogram on the right side her prior mammogram in February 2020 was normal in August 2020 she was noted to have a 2.7 x 2 x 2.3 cm mass in her right breast along with 4 morphologically abnormal lymph nodes.  She underwent breast biopsy as well as lymph node biopsy which showed grade 3 invasive mammary carcinoma.  ER PR and HER-2/neu negative.  Sections show high-grade invasive carcinoma with focal squamous differentiation and pinpoint keratin ideation.  The features are concerning for metaplastic differentiation.[name]   Lymphovascular invasion present.  There was extranodal extension present on the lymph node biopsy.         MRI of bilateral breasts showed:   Primary right breast mass was 3.5 x 2.7 x 3.4 cm.  There were surrounding numerous nodules consistent with satellite lesions.  Extensive nodular non-mass enhancement throughout the right breast involving the upper and lower quadrants spanning 6.2 x 4.5 x 5.5 cm.  Markedly enhancement of the left breast diffusely.  Non-mass enhancement measures 1.9 x 2.4 cm.  At least 3 abnormal lymph nodes in the right axilla.  No abnormal left axillary lymph nodes.  Noted to have ER positive DCIS in her left breast on biopsy   Patient had 3 cycles of neoadjuvant AC chemotherapy along with Keytruda.  There was no significant response in her tumor and patient proceeded with bilateral mastectomy with targeted node dissection.  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  7 lymph nodes were positive for malignancy with extranodal extension overall cancer cellularity was 40% [id number].  Patient completed 1 cycle of adjuvant AC chemotherapy and will complete weekly Taxol chemotherapy in April 2021. She completed post mastectomy radiation     PET scan in November 2021 was done for ongoing chest wall pain.  It showed:1. Aggressive appearing and extensive recurrent right breast cancer  with right chest wall involvement as detailed above.  2. No findings for mediastinal/hilar metastatic lymphadenopathy or  metastatic pulmonary disease.  3. Single left hepatic lobe liver lesion consistent with metastatic  focus.  4. Hypermetabolic soft tissue mass in the left oropharynx/tonsillar  region suspicious for primary neoplasm.    Patient Stated Goals I want my R arm smaller - like the other one - they way it was    Currently in Pain? Yes    Pain Location --   Axilla, chest and scapula   Pain Orientation Right    Pain Descriptors / Indicators Shooting    Pain Type Acute  pain;Neuropathic pain               LYMPHEDEMA/ONCOLOGY QUESTIONNAIRE - 01/03/21 0001      Right Upper Extremity Lymphedema   15 cm Proximal to Olecranon Process 32 cm    10 cm Proximal to Olecranon Process 30 cm    Olecranon Process 26 cm    15 cm Proximal to Ulnar Styloid Process 24.5 cm    10 cm Proximal to Ulnar Styloid Process 22 cm    Just Proximal to Ulnar Styloid Process 16 cm    Across Hand at [employer]  18 cm      Left Upper Extremity Lymphedema   Just Proximal to Ulnar Styloid Process 15 cm             measurements taken -see flow sheet- R UE decrease last week  close to 2 cm in forearm and 1 cm proximal forearm, elbow and upper arm  BUT this date - week of chemo - tomorrow getting chemo again - her Measurement increased even with use of night time compression most all the time -and when out and about wearing temporary Tubigrip combination - but not containing her  Pt brought in her old [place] strong sleeve and glove in last time - but to tight - not fitting her   And her  daytime Job[employer] elvarex soft sleeve  also to tight   Discuss with pt and Rep at [employer] - appear pt lymphedema increases week of chemo - days prior and afterwards -  Pt to wear her night time Lelan Pons this week with chemo and then will reasses her Monday 14th  Will get her remeasure for new daytime compression sleeve -[employer] Elvarex for containment if need different size  Adjusted tabs on her night time Tribute sleeve and glove for gradient of compression Fitted with new isotoner glove , tubigrip D on hand to elbow and E forearm to axilla  To wear when out and about and not able to wear night time   She do Report during and after chemo her arm is fuller and tighter and more swollen- will address schedule of wearing compression next time               OT Education - 01/03/21 1146    Education Details wearing of compression and progress; POC    Person(s) Educated Patient     Methods Explanation;Demonstration;Tactile cues;Verbal cues    Comprehension Verbal cues required;Returned demonstration;Verbalized understanding               OT Long Term Goals - 12/22/20 1729      OT LONG TERM GOAL #1   Title Pt to be independent in HEP to decrease R UE circumference decrease in upper arm,elbow and forearm by more than 2 cm to fit in correct daytime compression without pain    Baseline increase compare to L by 4 cm in upper arm and forearm, elbow 3 mc , wrist 3cm and hand 1 cm    Time 3    Period Weeks    Status New    Target Date 01/12/21      OT LONG TERM GOAL #2   Title Pt to be independent in wearing correct night time and daytime compressoin sleeve to maintain her R UE lymphedema without increase symptoms    Baseline daytime compression to smalle and causing pain - night time pt did not wear since she got it -to hot and pain in upper arm and axilla    Time 6    Period Weeks    Status New    Target Date 02/02/21                 Plan - 01/03/21 1147    Clinical Impression Statement Pt show great decongesting of R UE last week - the week she did not had chemo- using her night time Tribute compression sleeve most of the time- but this week prior to chemo her circumference even with night time compression  increase but not back to baseline 2 wks ago - pt do  appear to show increase lymphedema days prior and after chemo - pt to use her night time compression this week during and after chemo and will remeasure her 14th - and probably get her remeasure for new daytime compression sleeve - heaviness, acheness, tightness decrease in R UE - but cont to have shooting pain in scapula, in bones per pt - pt to mention it to Dr Janese Banks    OT Occupational Profile and History Problem Focused Assessment - Including review of records relating to presenting problem    Occupational performance deficits (Please refer to evaluation for details): ADL's;IADL's;Play;Leisure;Rest and  Sleep    Body Structure / Function / Physical Skills Edema;Strength;ROM;UE functional use;Flexibility;Decreased knowledge of precautions;ADL;IADL;Pain    Rehab Potential Good    Clinical Decision Making Limited treatment options, no task modification necessary    Comorbidities Affecting Occupational Performance: May have comorbidities impacting occupational performance    Modification or Assistance to Complete Evaluation  No modification of tasks or assist necessary to complete eval    OT Frequency 1x / week    OT Duration 6 weeks    OT Treatment/Interventions Self-care/ADL training;Manual lymph drainage;Patient/family education;Compression bandaging;Manual Therapy;DME and/or AE instruction    Plan assess and get remeasure for new daytime compression sleeve    Consulted and Agree with Plan of Care Patient           Patient will benefit from skilled therapeutic intervention in order to improve the following deficits and impairments:   Body Structure / Function / Physical Skills: Edema,Strength,ROM,UE functional use,Flexibility,Decreased knowledge of precautions,ADL,IADL,Pain       Visit Diagnosis: Postmastectomy lymphedema syndrome    Problem List Patient Active Problem List   Diagnosis Date Noted  . Ductal carcinoma in situ (DCIS) of left breast 03/04/2020  . Bilateral breast cancer ([place]) 10/09/2019  . Genetic testing 08/27/2019  . Family history of breast cancer   . Family history of ovarian cancer   . Family history of colon cancer   . Family history of leukemia   . Goals of care, counseling/discussion 08/07/2019  . Breast cancer ([place]) 07/30/2019  . GERD (gastroesophageal reflux disease) 06/30/2019  . Abdominal discomfort 05/15/2019  . Advance care planning 07/14/2018  . Health care maintenance 07/14/2018  . Depression, recurrent ([place]) 07/14/2018  . Constipation 07/14/2018  . Altered taste 07/14/2018  . Fibrocystic breast changes of both breasts 01/24/2018  .  Vasomotor symptoms due to menopause 09/05/2017  . Herpes simplex vulvovaginitis 09/05/2017  . Skin nodule 05/25/2017  . Encounter for screening examination for infectious disease 03/18/2017  . Lipoma 03/18/2017  . Vaginitis 03/18/2017  . Radicular pain in left arm 07/27/2015  . Neuralgia and neuritis, unspecified 07/27/2015  . Fatigue 03/05/2015  . B12 deficiency 03/05/2015  . Skin rash 05/01/2014  . MVP (mitral valve prolapse) 04/30/2014  . Cyst of breast 04/05/2014  . Interstitial cystitis 03/31/2014  . Routine general medical examination at a health care facility 11/13/2011  . Anxiety and depression 10/06/2010  . BREAST CYSTS, BILATERAL 12/03/2008  . PALPITATIONS, CHRONIC 12/03/2008  . Vitamin D deficiency 09/10/2008  . HLD (hyperlipidemia) 09/10/2008  . IRRITABLE BOWEL SYNDROME 02/12/2008    Rosalyn Gess OTR/l,CLT 01/03/2021, 11:54 AM  [facility] [address] [place], [address] Phone: [phone]   Fax:  [phone]  Name: REIANNA BATDORF MRN: [id number] Date of Birth: 04/08/1956

## 2021-01-04 ENCOUNTER — Inpatient Hospital Stay: Payer: Medicare Other | Attending: Oncology | Admitting: Oncology

## 2021-01-04 ENCOUNTER — Inpatient Hospital Stay: Payer: Medicare Other

## 2021-01-04 ENCOUNTER — Encounter: Payer: Self-pay | Admitting: Oncology

## 2021-01-04 VITALS — BP 119/76 | HR 88 | Temp 97.9°F | Wt 165.6 lb

## 2021-01-04 DIAGNOSIS — Z5189 Encounter for other specified aftercare: Secondary | ICD-10-CM | POA: Insufficient documentation

## 2021-01-04 DIAGNOSIS — Z171 Estrogen receptor negative status [ER-]: Secondary | ICD-10-CM | POA: Diagnosis not present

## 2021-01-04 DIAGNOSIS — R309 Painful micturition, unspecified: Secondary | ICD-10-CM | POA: Diagnosis not present

## 2021-01-04 DIAGNOSIS — C50511 Malignant neoplasm of lower-outer quadrant of right female breast: Secondary | ICD-10-CM

## 2021-01-04 DIAGNOSIS — Z5112 Encounter for antineoplastic immunotherapy: Secondary | ICD-10-CM | POA: Diagnosis not present

## 2021-01-04 DIAGNOSIS — Z853 Personal history of malignant neoplasm of breast: Secondary | ICD-10-CM

## 2021-01-04 DIAGNOSIS — Z5111 Encounter for antineoplastic chemotherapy: Secondary | ICD-10-CM | POA: Diagnosis not present

## 2021-01-04 DIAGNOSIS — C50811 Malignant neoplasm of overlapping sites of right female breast: Secondary | ICD-10-CM | POA: Diagnosis not present

## 2021-01-04 LAB — CBC WITH DIFFERENTIAL/PLATELET
Abs Immature Granulocytes: 0.02 10*3/uL (ref 0.00–0.07)
Basophils Absolute: 0 10*3/uL (ref 0.0–0.1)
Basophils Relative: 0 %
Eosinophils Absolute: 0.1 10*3/uL (ref 0.0–0.5)
Eosinophils Relative: 1 %
HCT: 29.9 % — ABNORMAL LOW (ref 36.0–46.0)
Hemoglobin: 9.8 g/dL — ABNORMAL LOW (ref 12.0–15.0)
Immature Granulocytes: 0 %
Lymphocytes Relative: 9 %
Lymphs Abs: 0.6 10*3/uL — ABNORMAL LOW (ref 0.7–4.0)
MCH: 29.6 pg (ref 26.0–34.0)
MCHC: 32.8 g/dL (ref 30.0–36.0)
MCV: 90.3 fL (ref 80.0–100.0)
Monocytes Absolute: 1.2 10*3/uL — ABNORMAL HIGH (ref 0.1–1.0)
Monocytes Relative: 20 %
Neutro Abs: 4.3 10*3/uL (ref 1.7–7.7)
Neutrophils Relative %: 70 %
Platelets: 300 10*3/uL (ref 150–400)
RBC: 3.31 MIL/uL — ABNORMAL LOW (ref 3.87–5.11)
RDW: 16.6 % — ABNORMAL HIGH (ref 11.5–15.5)
WBC: 6.2 10*3/uL (ref 4.0–10.5)
nRBC: 0 % (ref 0.0–0.2)

## 2021-01-04 LAB — COMPREHENSIVE METABOLIC PANEL
ALT: 24 U/L (ref 0–44)
AST: 26 U/L (ref 15–41)
Albumin: 3.6 g/dL (ref 3.5–5.0)
Alkaline Phosphatase: 83 U/L (ref 38–126)
Anion gap: 10 (ref 5–15)
BUN: 11 mg/dL (ref 8–23)
CO2: 27 mmol/L (ref 22–32)
Calcium: 8.9 mg/dL (ref 8.9–10.3)
Chloride: 100 mmol/L (ref 98–111)
Creatinine, Ser: 0.68 mg/dL (ref 0.44–1.00)
GFR, Estimated: >60 mL/min (ref >60)
Glucose, Bld: 113 mg/dL — ABNORMAL HIGH (ref 70–99)
Potassium: 3.9 mmol/L (ref 3.5–5.1)
Sodium: 137 mmol/L (ref 135–145)
Total Bilirubin: 0.4 mg/dL (ref 0.3–1.2)
Total Protein: 7.1 g/dL (ref 6.5–8.1)

## 2021-01-04 LAB — URINALYSIS, COMPLETE (UACMP) WITH MICROSCOPIC
Bacteria, UA: NONE SEEN
Bilirubin Urine: NEGATIVE
Glucose, UA: NEGATIVE mg/dL
Hgb urine dipstick: NEGATIVE
Ketones, ur: NEGATIVE mg/dL
Leukocytes,Ua: NEGATIVE
Nitrite: NEGATIVE
Protein, ur: NEGATIVE mg/dL
Specific Gravity, Urine: 1.003 — ABNORMAL LOW (ref 1.005–1.030)
Squamous Epithelial / HPF: NONE SEEN (ref 0–5)
pH: 6 (ref 5.0–8.0)

## 2021-01-04 MED ORDER — HEPARIN SOD (PORK) LOCK FLUSH 100 UNIT/ML IV SOLN
500.0000 [IU] | Freq: Once | INTRAVENOUS | Status: AC
  Administered 2021-01-04: 500 [IU] via INTRAVENOUS
  Filled 2021-01-04: qty 5

## 2021-01-04 MED ORDER — HEPARIN SOD (PORK) LOCK FLUSH 100 UNIT/ML IV SOLN
500.0000 [IU] | Freq: Once | INTRAVENOUS | Status: DC | PRN
  Filled 2021-01-04: qty 5

## 2021-01-04 MED ORDER — PALONOSETRON HCL INJECTION 0.25 MG/5ML
0.2500 mg | Freq: Once | INTRAVENOUS | Status: AC
  Administered 2021-01-04: 0.25 mg via INTRAVENOUS
  Filled 2021-01-04: qty 5

## 2021-01-04 MED ORDER — SODIUM CHLORIDE 0.9 % IV SOLN: 4.0000 mg | Freq: Once | INTRAVENOUS | Status: DC

## 2021-01-04 MED ORDER — DEXAMETHASONE SODIUM PHOSPHATE 10 MG/ML IJ SOLN
4.0000 mg | Freq: Once | INTRAMUSCULAR | Status: AC
  Administered 2021-01-04: 4 mg via INTRAVENOUS
  Filled 2021-01-04: qty 1

## 2021-01-04 MED ORDER — SODIUM CHLORIDE 0.9 % IV SOLN
210.0000 mg | Freq: Once | INTRAVENOUS | Status: AC
  Administered 2021-01-04: 210 mg via INTRAVENOUS
  Filled 2021-01-04: qty 21

## 2021-01-04 MED ORDER — SODIUM CHLORIDE 0.9 % IV SOLN
Freq: Once | INTRAVENOUS | Status: AC
  Filled 2021-01-04: qty 250

## 2021-01-04 MED ORDER — SODIUM CHLORIDE 0.9% FLUSH
10.0000 mL | INTRAVENOUS | Status: DC | PRN
  Administered 2021-01-04: 10 mL via INTRAVENOUS
  Filled 2021-01-04: qty 10

## 2021-01-04 MED ORDER — GEMCITABINE HCL CHEMO INJECTION 1 GM/26.3ML
1800.0000 mg | Freq: Once | INTRAVENOUS | Status: AC
Start: 2021-01-04 — End: 2021-01-04
  Administered 2021-01-04: 1800 mg via INTRAVENOUS
  Filled 2021-01-04: qty 26.3

## 2021-01-04 NOTE — Progress Notes (Unsigned)
CARE Discharge Progress Report  Patient Details  Name: Debra Little MRN: [id number] Date of Birth: 1956/05/28 Referring Provider:     Number of Visits: 9  Reason for Discharge:  Early Exit:  Personal  Discharged: 11/30/2020  Smoking History:  Social History   Tobacco Use  Smoking Status Never Smoker  Smokeless Tobacco Never Used    Diagnosis:  HX: breast cancer  ADL UCSD:   Initial Exercise Prescription:   Discharge Exercise Prescription (Final Exercise Prescription Changes):  Exercise Prescription Changes - 09/21/20 1400      Response to Exercise   Blood Pressure (Admit) 106/58    Blood Pressure (Exercise) 126/62    Blood Pressure (Exit) 118/62    Heart Rate (Admit) 83 bpm    Heart Rate (Exercise) 112 bpm    Heart Rate (Exit) 86 bpm    Oxygen Saturation (Admit) 97 %    Oxygen Saturation (Exercise) 95 %    Oxygen Saturation (Exit) 97 %    Rating of Perceived Exertion (Exercise) 13    Perceived Dyspnea (Exercise) 1      Progression   Average METs 3.02      Resistance Training   Weight 3 lb    Reps 10-15      Treadmill   MPH 2.6    Grade 0    Minutes 15    METs 2.99      NuStep   Level 3    SPM 80    Minutes 15    METs 3.4      REL-XR   Level 3    Minutes 15    METs 2.6           Functional Capacity:   Psychological, QOL, Others - Outcomes: PHQ 2/9: Depression screen PHQ 2/9 04/09/2020  Decreased Interest 0  Down, Depressed, Hopeless 0  PHQ - 2 Score 0  Altered sleeping 0  Tired, decreased energy 0  Change in appetite 0  Feeling bad or failure about yourself  0  Trouble concentrating 0  Moving slowly or fidgety/restless 0  Suicidal thoughts 0  PHQ-9 Score 0  Difficult doing work/chores Not difficult at all  Some recent data might be hidden

## 2021-01-04 NOTE — Progress Notes (Signed)
Pt tolerated treatment well with no s/s of distress noted. Pt stable at discharge.

## 2021-01-05 ENCOUNTER — Telehealth: Payer: Self-pay | Admitting: *Deleted

## 2021-01-05 NOTE — Telephone Encounter (Signed)
Debra Little has seen pt and pt needs new sleeve and I faxed it to [employer] and maureen has asked for asst from [employer] to cover the sleeve.

## 2021-01-10 ENCOUNTER — Ambulatory Visit: Payer: Medicare Other | Admitting: Occupational Therapy

## 2021-01-10 NOTE — Progress Notes (Signed)
Hematology/Oncology Consult note [facility]  Telephone:([phone] [phone]  Patient Care Team: Tonia Ghent, MD as PCP - General (Family Medicine) Bary Castilla, Forest Gleason, MD (General Surgery) Modesto Charon, MD (Family Medicine) Laneta Simmers as Physician Assistant (Urology) [name] Javier Docker, MD as Consulting Physician (Cardiology) Sindy Guadeloupe, MD as Consulting Physician (Hematology and Oncology) Noreene Filbert, MD as Referring Physician (Radiation Oncology)   Name of the patient: Debra Little  [id number]  09-11-1956   Date of visit: 01/10/21  Diagnosis- 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  Heme/Onc history: Patient is a 65 year old female with seen [name] in the past and has undergone breast biopsies which did not previously showed malignancy. More recently patient noted some red discoloration around her right perioral as well as a palpable mass which led to a diagnostic mammogram on the right side her prior mammogram in February 2020 was normal in August 2020 she was noted to have a 2.7 x 2 x 2.3 cm mass in her right breast along with 4 morphologically abnormal lymph nodes. She underwent breast biopsy as well as lymph node biopsy which showed grade 3 invasive mammary carcinoma. ER PR and HER-2/neunegative.Sections show high-grade invasive carcinoma with focal squamous differentiation and pinpoint keratin ideation. The features are concerning for metaplastic differentiation.[name] Lymphovascular invasion present. There was extranodal extension present on the lymph node biopsy.   Her family history is significant for breast cancer in her mother in her 40s. Father had colon cancer in his70s.Family history also significant for ovarian cancer in her maternal  aunt.  MRI of bilateral breasts showed: Primary right breast mass was 3.5 x 2.7 x 3.4 cm. There were surrounding numerous nodules consistent with satellite lesions. Extensive nodular non-mass enhancement throughout the right breast involving the upper and lower quadrants spanning 6.2 x 4.5 x 5.5 cm. Markedly enhancement of the left breast diffusely. Non-mass enhancement measures 1.9 x 2.4 cm. At least 3 abnormal lymph nodes in the right axilla. No abnormal left axillary lymph nodes. 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 Equivocal inferior breast and left axillary nodal hypermetabolism. No evidence of extrathoracic hypermetabolic metastases. Lateral right breast primary with right axillary nodal metastases  Patient had 3 cycles of neoadjuvant AC chemotherapy along with Keytruda. There was no significant response in her tumor and patient proceeded with bilateral mastectomy with targeted node dissection. 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 7 lymph nodes were positive for malignancy with extranodal extension overall cancer cellularity was 40% [id number].Patient completed 1 cycle of adjuvant AC chemotherapy and willcomplete weekly Taxol chemotherapy in April 2021. She completed post mastectomy radiation  PET scan in November 2021 was done for ongoing chest wall pain. It showed:1. Aggressive appearing and extensive recurrent right breast cancer with right chest wall involvement as detailed above. 2. No findings for mediastinal/hilar metastatic lymphadenopathy or metastatic pulmonary disease. 3. Single left hepatic lobe liver lesion consistent with metastatic focus. 4. Hypermetabolic soft tissue mass in the left oropharynx/tonsillar region suspicious for primary neoplasm.  NGS testing showed PIK3CA mutation and PD-L1 CPS  score of 95. No other actionable mutation was seen.    Interval history-patient is concerned that the present treatment will work for her or not.  She continues to have right upper extremity pain and has received a new sleeve from occupational therapy.  She does not use Dilaudid as much.  Patient reports incomplete sensation and pain with bladder emptying.  ECOG PS- 1 Pain scale- 4 Opioid associated constipation- no  Review of systems- Review of Systems  Constitutional: Positive for malaise/fatigue. Negative for chills, fever and weight loss.  HENT: Negative for congestion, ear discharge and nosebleeds.   Eyes: Negative for blurred vision.  Respiratory: Negative for cough, hemoptysis, sputum production, shortness of breath and wheezing.   Cardiovascular: Negative for chest pain, palpitations, orthopnea and claudication.  Gastrointestinal: Negative for abdominal pain, blood in stool, constipation, diarrhea, heartburn, melena, nausea and vomiting.  Genitourinary: Negative for dysuria, flank pain, frequency, hematuria and urgency.  Musculoskeletal: Negative for back pain, joint pain and myalgias.       Right arm pain  Skin: Negative for rash.  Neurological: Negative for dizziness, tingling, focal weakness, seizures, weakness and headaches.  Endo/Heme/Allergies: Does not bruise/bleed easily.  Psychiatric/Behavioral: Negative for depression and suicidal ideas. The patient does not have insomnia.       Allergies  Allergen Reactions  . Amitiza [Lubiprostone] Other (See Comments)    Overly effective at higher dose  . Bentyl [Dicyclomine Hcl]     Lack of effect  . Cortisone Other (See Comments)    flush  . Mobic [Meloxicam]     Palpitations.  But can tolerate aleve  . Nitrofurantoin Monohyd Macro   . Sulfonamide Derivatives Nausea Only  . Zoloft [Sertraline Hcl] Diarrhea and Nausea Only  . Buspar [Buspirone] Palpitations  . Celecoxib Rash    REACTION: unspecified  . Doxycycline  Palpitations    chest pain  . Penicillins Rash     Past Medical History:  Diagnosis Date  . Anemia    d/t chemo - last Hgb 10.8  . Anxiety    sees Dr. [name]  . BRCA negative    Invitae panel neg except CHEK2 VUS  . Breast cancer ([place]) 2020  . Cyst of breast    per [name]  . Dysrhythmia    Hx of palpitations  . Family history of breast cancer   . Family history of colon cancer   . Family history of leukemia   . Family history of ovarian cancer   . Fibromyalgia   . GERD (gastroesophageal reflux disease)   . Heart murmur   . Hemorrhoids   . Herpes, genital 04/2015   confirmed with HSV 2 IgG  . Hiatal hernia   . Hypercholesterolemia    Dr. Kyra Searles  . Hyperlipidemia   . IBS (irritable bowel syndrome)    constipation predominant  . IC (interstitial cystitis)    per [facility]  . Mild depression ([place])   . MVP (mitral valve prolapse)    [facility] cards eval 2012  . Osteopenia    2010/2017, DEXA at [facility];spine and fem neck  . PONV (postoperative nausea and vomiting)   . Vitamin D deficiency    history of     Past Surgical History:  Procedure Laterality Date  . ABDOMINAL HYSTERECTOMY    . APPENDECTOMY    . BLADDER SURGERY     1980's  . BREAST EXCISIONAL BIOPSY Right    benign  . BREAST EXCISIONAL BIOPSY Bilateral    benign  . COLONOSCOPY  09/2014   Dr. [name]  . COLONOSCOPY  2009   at [facility] 1 POLYP (BENIGN)  . excision of breast cysts  hx of multiple cyst aspirations  . EXPLORATORY LAPAROTOMY    . MASTECTOMY MODIFIED RADICAL Bilateral 10/09/2019   Procedure: RIGHT MASTECTOMY MODIFIED RADICAL AND LEFT TOTAL MASTECTOMY;  Surgeon: Rolm Bookbinder, MD;  Location: [place];  Service: General;  Laterality: Bilateral;  . PORTA CATH INSERTION N/A 08/06/2019   Procedure: PORTA CATH INSERTION;  Surgeon: Katha Cabal, MD;  Location: [facility];  Service: Cardiovascular;  Laterality: N/A;  . PORTACATH PLACEMENT Left 08/05/2019   Procedure:  INSERTION PORT-A-CATH, Attempted;  Surgeon: Jules Husbands, MD;  Location: [facility];  Service: General;  Laterality: Left;  . TUBAL LIGATION      Social History   Socioeconomic History  . Marital status: Married    Spouse name: Not on file  . Number of children: 1  . Years of education: Not on file  . Highest education level: Not on file  Occupational History  . Occupation: [employer]  Tobacco Use  . Smoking status: Never Smoker  . Smokeless tobacco: Never Used  Vaping Use  . Vaping Use: Never used  Substance and Sexual Activity  . Alcohol use: No    Alcohol/week: 0.0 standard drinks  . Drug use: No  . Sexual activity: Yes    Birth control/protection: Surgical    Comment: Hysterectomy  Other Topics Concern  . Not on file  Social History Narrative   Married in 1997/02/14, divorced as of Feb 14, 2017, 1 son from previous relationship   Brother with MVA at 65, in rest home for many years as of Feb 14, 2018- died of covid recently   Social Determinants of Radio broadcast assistant Strain: [place]   . Difficulty of Paying Living Expenses: Not hard at all  Food Insecurity: No Food Insecurity  . Worried About Charity fundraiser in the Last Year: Never true  . Ran Out of Food in the Last Year: Never true  Transportation Needs: No Transportation Needs  . Lack of Transportation (Medical): No  . Lack of Transportation (Non-Medical): No  Physical Activity: Inactive  . Days of Exercise per Week: 0 days  . Minutes of Exercise per Session: 0 min  Stress: No Stress Concern Present  . Feeling of Stress : Not at all  Social Connections: Not on file  Intimate Partner Violence: Not At Risk  . Fear of Current or Ex-Partner: No  . Emotionally Abused: No  . Physically Abused: No  . Sexually Abused: No    Family History  Problem Relation Age of Onset  . Breast cancer Mother 90  . Hypertension Mother   . Colon cancer Father 62  . Hyperlipidemia Father   . Ovarian cancer Maternal Aunt 80  . Breast  cancer Cousin   . Gallbladder disease Paternal Grandmother   . Liver cancer Cousin 70       Malignant  . Cancer Maternal Uncle        unsure type     Current Outpatient Medications:  .  acetaminophen (TYLENOL) 650 MG CR tablet, Take 650 mg by mouth every 8 (eight) hours as needed for pain., Disp: , Rfl:  .  ascorbic acid (VITAMIN C) 500 MG tablet, Take 500 mg by mouth daily., Disp: , Rfl:  .  atorvastatin (LIPITOR) 40 MG tablet, Take 40 mg by mouth at bedtime. , Disp: , Rfl: 1 .  Calcium Carbonate-Vit D-Min (CALCIUM 1200 PO), Take 4 tablets by mouth daily., Disp: , Rfl:  .  cholecalciferol (VITAMIN D3) 25 MCG (1000 UNIT) tablet,  Take 1 tablet (1,000 Units total) by mouth daily., Disp: , Rfl:  .  clonazePAM (KLONOPIN) 0.5 MG tablet, Take 0.5 mg by mouth 2 (two) times daily. , Disp: , Rfl:  .  dexamethasone (DECADRON) 4 MG tablet, Take 2 tablets (8 mg total) by mouth daily. Start the day after chemotherapy for 2 days. Take with food., Disp: 30 tablet, Rfl: 1 .  Dexlansoprazole 30 MG capsule, Take 1 capsule (30 mg total) by mouth daily., Disp: 30 capsule, Rfl: 6 .  DULoxetine (CYMBALTA) 60 MG capsule, Take 60 mg by mouth daily. , Disp: , Rfl:  .  furosemide (LASIX) 20 MG tablet, Take 1 tablet (20 mg total) by mouth every other day., Disp: , Rfl:  .  HYDROmorphone HCl (DILAUDID) 1 MG/ML LIQD, Take 0.5 mLs (0.5 mg total) by mouth every 4 (four) hours as needed for severe pain., Disp: 90 mL, Rfl: 0 .  lactulose (CHRONULAC) 10 GM/15ML solution, Take 45 mLs (30 g total) by mouth 2 (two) times daily., Disp: 236 mL, Rfl: 1 .  lidocaine-prilocaine (EMLA) cream, Apply to affected area once, Disp: 30 g, Rfl: 3 .  loratadine (CLARITIN) 10 MG tablet, Take 1 tablet (10 mg total) by mouth daily., Disp: , Rfl:  .  NON FORMULARY, Take 5 mLs by mouth 4 (four) times daily as needed. Swish and swallow 5-10 ml of Medicated Mouthwash 4 x per day as needed, Disp: , Rfl:  .  OLANZapine (ZYPREXA) 10 MG tablet, Take 1  tablet (10 mg total) by mouth at bedtime. For nausea or vomiting, Disp: 30 tablet, Rfl: 0 .  ondansetron (ZOFRAN) 8 MG tablet, Take 1 tablet (8 mg total) by mouth 2 (two) times daily as needed for refractory nausea / vomiting. Start on day 3 after chemotherapy., Disp: 30 tablet, Rfl: 1 .  Plecanatide (TRULANCE) 3 MG TABS, Take 3 mg by mouth daily., Disp: 30 tablet, Rfl: 11 .  polyethylene glycol (MIRALAX / GLYCOLAX) 17 g packet, Take 17 g by mouth daily., Disp: , Rfl:  .  pregabalin (LYRICA) 75 MG capsule, Take 1 capsule (75 mg total) by mouth 2 (two) times daily., Disp: 60 capsule, Rfl: 2 .  prochlorperazine (COMPAZINE) 10 MG tablet, Take 1 tablet (10 mg total) by mouth every 6 (six) hours as needed (Nausea or vomiting)., Disp: 30 tablet, Rfl: 1 .  sennosides-docusate sodium (SENOKOT-S) 8.6-50 MG tablet, Take 1-2 tablets by mouth at bedtime., Disp: , Rfl:  .  sucralfate (CARAFATE) 1 g tablet, Take 1 g by mouth 3 (three) times daily., Disp: , Rfl:  .  triamcinolone (KENALOG) 0.1 %, Apply 1 application topically 2 (two) times daily as needed. For itching, Disp: 80 g, Rfl: 0 .  valACYclovir (VALTREX) 500 MG tablet, Take 1 tablet (500 mg total) by mouth daily., Disp: 90 tablet, Rfl: 3 .  vitamin B-12 (CYANOCOBALAMIN) 500 MCG tablet, Take 500 mcg by mouth daily., Disp: , Rfl:   Physical exam:  Vitals:   01/04/21 1021  BP: 119/76  Pulse: 88  Temp: 97.9 F (36.6 C)  TempSrc: Tympanic  SpO2: 97%  Weight: 165 lb 9.6 oz (75.1 kg)   Physical Exam Constitutional:      General: She is not in acute distress. Eyes:     Extraocular Movements: EOM normal.  Cardiovascular:     Rate and Rhythm: Normal rate and regular rhythm.     Heart sounds: Normal heart sounds.  Pulmonary:     Effort: Pulmonary effort is normal.  Breath sounds: Normal breath sounds.  Abdominal:     General: Bowel sounds are normal.     Palpations: Abdomen is soft.  Musculoskeletal:     Comments: Right upper extremity is in  a sleeve  Skin:    General: Skin is warm and dry.  Neurological:     Mental Status: She is alert and oriented to person, place, and time.      CMP Latest Ref Rng & Units 01/04/2021  Glucose 70 - 99 mg/dL 113(H)  BUN 8 - 23 mg/dL 11  Creatinine 0.44 - 1.00 mg/dL 0.68  Sodium 135 - 145 mmol/L 137  Potassium 3.5 - 5.1 mmol/L 3.9  Chloride 98 - 111 mmol/L 100  CO2 22 - 32 mmol/L 27  Calcium 8.9 - 10.3 mg/dL 8.9  Total Protein 6.5 - 8.1 g/dL 7.1  Total Bilirubin 0.3 - 1.2 mg/dL 0.4  Alkaline Phos 38 - 126 U/L 83  AST 15 - 41 U/L 26  ALT 0 - 44 U/L 24   CBC Latest Ref Rng & Units 01/04/2021  WBC 4.0 - 10.5 K/uL 6.2  Hemoglobin 12.0 - 15.0 g/dL 9.8(L)  Hematocrit 36.0 - 46.0 % 29.9(L)  Platelets 150 - 400 K/uL 300      Assessment and plan- Patient is a 65 y.o. female with metastatic triple negative metaplastic breast cancer with lymph node bone and liver metastases.  She is here for on treatment assessment prior to cycle 5-day 1 of carboplatin and gemcitabine chemotherapy  Counts okay to proceed with cycle 5-day 1 of gemcitabine chemotherapy today.Around 10.  She will receive Keytruda along with carboplatin and gemcitabine next week.  I will see her back in 3 weeks for cycle 6-day 1 of carboplatin and gemcitabine  Neoplasm related pain: Patient is currently on As needed Dilaudid which she does not use as much.  She is also on Cymbalta and Lyrica.  Dysuria: We will check urinalysis today   Visit Diagnosis 1. Pain emptying bladder   2. Encounter for antineoplastic chemotherapy   3. Encounter for antineoplastic immunotherapy      Dr. Randa Evens, MD, MPH [facility] [id number] 01/10/2021 8:09 AM

## 2021-01-11 ENCOUNTER — Other Ambulatory Visit: Payer: Self-pay | Admitting: Oncology

## 2021-01-11 ENCOUNTER — Inpatient Hospital Stay: Payer: Medicare Other

## 2021-01-11 VITALS — BP 144/77 | HR 89 | Temp 98.0°F | Wt 167.0 lb

## 2021-01-11 DIAGNOSIS — Z171 Estrogen receptor negative status [ER-]: Secondary | ICD-10-CM | POA: Diagnosis not present

## 2021-01-11 DIAGNOSIS — C50511 Malignant neoplasm of lower-outer quadrant of right female breast: Secondary | ICD-10-CM

## 2021-01-11 DIAGNOSIS — Z5111 Encounter for antineoplastic chemotherapy: Secondary | ICD-10-CM | POA: Diagnosis not present

## 2021-01-11 DIAGNOSIS — Z5189 Encounter for other specified aftercare: Secondary | ICD-10-CM | POA: Diagnosis not present

## 2021-01-11 DIAGNOSIS — C50811 Malignant neoplasm of overlapping sites of right female breast: Secondary | ICD-10-CM | POA: Diagnosis not present

## 2021-01-11 DIAGNOSIS — Z5112 Encounter for antineoplastic immunotherapy: Secondary | ICD-10-CM

## 2021-01-11 LAB — COMPREHENSIVE METABOLIC PANEL
ALT: 30 U/L (ref 0–44)
AST: 29 U/L (ref 15–41)
Albumin: 3.4 g/dL — ABNORMAL LOW (ref 3.5–5.0)
Alkaline Phosphatase: 80 U/L (ref 38–126)
Anion gap: 9 (ref 5–15)
BUN: 12 mg/dL (ref 8–23)
CO2: 27 mmol/L (ref 22–32)
Calcium: 8.6 mg/dL — ABNORMAL LOW (ref 8.9–10.3)
Chloride: 102 mmol/L (ref 98–111)
Creatinine, Ser: 0.79 mg/dL (ref 0.44–1.00)
GFR, Estimated: >60 mL/min (ref >60)
Glucose, Bld: 113 mg/dL — ABNORMAL HIGH (ref 70–99)
Potassium: 3.9 mmol/L (ref 3.5–5.1)
Sodium: 138 mmol/L (ref 135–145)
Total Bilirubin: 0.5 mg/dL (ref 0.3–1.2)
Total Protein: 6.8 g/dL (ref 6.5–8.1)

## 2021-01-11 LAB — CBC WITH DIFFERENTIAL/PLATELET
Abs Immature Granulocytes: 0.02 10*3/uL (ref 0.00–0.07)
Basophils Absolute: 0 10*3/uL (ref 0.0–0.1)
Basophils Relative: 1 %
Eosinophils Absolute: 0.1 10*3/uL (ref 0.0–0.5)
Eosinophils Relative: 2 %
HCT: 28.3 % — ABNORMAL LOW (ref 36.0–46.0)
Hemoglobin: 9.3 g/dL — ABNORMAL LOW (ref 12.0–15.0)
Immature Granulocytes: 1 %
Lymphocytes Relative: 16 %
Lymphs Abs: 0.5 10*3/uL — ABNORMAL LOW (ref 0.7–4.0)
MCH: 29.7 pg (ref 26.0–34.0)
MCHC: 32.9 g/dL (ref 30.0–36.0)
MCV: 90.4 fL (ref 80.0–100.0)
Monocytes Absolute: 0.8 10*3/uL (ref 0.1–1.0)
Monocytes Relative: 26 %
Neutro Abs: 1.7 10*3/uL (ref 1.7–7.7)
Neutrophils Relative %: 54 %
Platelets: 232 10*3/uL (ref 150–400)
RBC: 3.13 MIL/uL — ABNORMAL LOW (ref 3.87–5.11)
RDW: 16 % — ABNORMAL HIGH (ref 11.5–15.5)
WBC: 3.2 10*3/uL — ABNORMAL LOW (ref 4.0–10.5)
nRBC: 0 % (ref 0.0–0.2)

## 2021-01-11 LAB — IRON AND TIBC
Iron: 23 ug/dL — ABNORMAL LOW (ref 28–170)
Saturation Ratios: 8 % — ABNORMAL LOW (ref 10.4–31.8)
TIBC: 273 ug/dL (ref 250–450)
UIBC: 250 ug/dL

## 2021-01-11 LAB — FOLATE: Folate: 28 ng/mL (ref >5.9)

## 2021-01-11 LAB — FERRITIN: Ferritin: 90 ng/mL (ref 11–307)

## 2021-01-11 LAB — VITAMIN B12: Vitamin B-12: 1552 pg/mL — ABNORMAL HIGH (ref 180–914)

## 2021-01-11 MED ORDER — SODIUM CHLORIDE 0.9 % IV SOLN
1800.0000 mg | Freq: Once | INTRAVENOUS | Status: AC
  Administered 2021-01-11: 1800 mg via INTRAVENOUS
  Filled 2021-01-11: qty 26.3

## 2021-01-11 MED ORDER — HEPARIN SOD (PORK) LOCK FLUSH 100 UNIT/ML IV SOLN
INTRAVENOUS | Status: AC
  Filled 2021-01-11: qty 5

## 2021-01-11 MED ORDER — PALONOSETRON HCL INJECTION 0.25 MG/5ML
0.2500 mg | Freq: Once | INTRAVENOUS | Status: AC
  Administered 2021-01-11: 0.25 mg via INTRAVENOUS
  Filled 2021-01-11: qty 5

## 2021-01-11 MED ORDER — SODIUM CHLORIDE 0.9 % IV SOLN
Freq: Once | INTRAVENOUS | Status: AC
  Filled 2021-01-11: qty 250

## 2021-01-11 MED ORDER — DEXAMETHASONE SODIUM PHOSPHATE 10 MG/ML IJ SOLN
4.0000 mg | Freq: Once | INTRAMUSCULAR | Status: AC
  Administered 2021-01-11: 4 mg via INTRAVENOUS
  Filled 2021-01-11: qty 1

## 2021-01-11 MED ORDER — SODIUM CHLORIDE 0.9 % IV SOLN
200.0000 mg | Freq: Once | INTRAVENOUS | Status: AC
  Administered 2021-01-11: 200 mg via INTRAVENOUS
  Filled 2021-01-11: qty 8

## 2021-01-11 MED ORDER — PEGFILGRASTIM 6 MG/0.6ML ~~LOC~~ PSKT
6.0000 mg | PREFILLED_SYRINGE | Freq: Once | SUBCUTANEOUS | Status: AC
  Administered 2021-01-11: 6 mg via SUBCUTANEOUS
  Filled 2021-01-11: qty 0.6

## 2021-01-11 MED ORDER — CARBOPLATIN CHEMO INJECTION 450 MG/45ML
210.0000 mg | Freq: Once | INTRAVENOUS | Status: AC
Start: 2021-01-11 — End: 2021-01-11
  Administered 2021-01-11: 210 mg via INTRAVENOUS
  Filled 2021-01-11: qty 21

## 2021-01-11 MED ORDER — HEPARIN SOD (PORK) LOCK FLUSH 100 UNIT/ML IV SOLN
500.0000 [IU] | Freq: Once | INTRAVENOUS | Status: AC | PRN
  Administered 2021-01-11: 500 [IU]
  Filled 2021-01-11: qty 5

## 2021-01-11 MED ORDER — SODIUM CHLORIDE 0.9 % IV SOLN: 4.0000 mg | Freq: Once | INTRAVENOUS | Status: DC

## 2021-01-11 NOTE — Progress Notes (Signed)
Pt  reports SOB with excretion and increased fatigue/weakness, Dr. Janese Banks aware, per Dr, Janese Banks proceed with treatment , Beckey Rutter at chairside to assess pt.     1210: pt states that after she got home after her last treatment she had a bad headache (lasted about a day) and blurred vision (that lasted "a few days"). Pt states she is currently experiencing blurred vision but no headache and B/P 144/77. Beckey Rutter NP and Dr. Janese Banks aware. Per Beckey Rutter NP okay to discharge pt home, pt to contact clinic if symptoms worsens or does not improve. Pt aware and Verbalizes understanding. Pt will also monitor b/p at home per pt.  Pt stable at time of discharge.

## 2021-01-13 ENCOUNTER — Ambulatory Visit: Payer: Medicare Other

## 2021-01-13 ENCOUNTER — Other Ambulatory Visit: Payer: Medicare Other

## 2021-01-20 DIAGNOSIS — I972 Postmastectomy lymphedema syndrome: Secondary | ICD-10-CM | POA: Diagnosis not present

## 2021-01-20 DIAGNOSIS — C50911 Malignant neoplasm of unspecified site of right female breast: Secondary | ICD-10-CM | POA: Diagnosis not present

## 2021-01-20 DIAGNOSIS — C50112 Malignant neoplasm of central portion of left female breast: Secondary | ICD-10-CM | POA: Diagnosis not present

## 2021-01-20 DIAGNOSIS — Z9013 Acquired absence of bilateral breasts and nipples: Secondary | ICD-10-CM | POA: Diagnosis not present

## 2021-01-20 DIAGNOSIS — C50111 Malignant neoplasm of central portion of right female breast: Secondary | ICD-10-CM | POA: Diagnosis not present

## 2021-01-25 ENCOUNTER — Inpatient Hospital Stay: Payer: Medicare Other | Attending: Oncology

## 2021-01-25 ENCOUNTER — Other Ambulatory Visit: Payer: Self-pay

## 2021-01-25 ENCOUNTER — Inpatient Hospital Stay: Payer: Medicare Other | Admitting: Oncology

## 2021-01-25 ENCOUNTER — Inpatient Hospital Stay: Payer: Medicare Other

## 2021-01-25 ENCOUNTER — Encounter: Payer: Self-pay | Admitting: Oncology

## 2021-01-25 VITALS — BP 123/68 | HR 89 | Temp 98.9°F | Resp 16 | Wt 168.3 lb

## 2021-01-25 DIAGNOSIS — T451X5A Adverse effect of antineoplastic and immunosuppressive drugs, initial encounter: Secondary | ICD-10-CM | POA: Insufficient documentation

## 2021-01-25 DIAGNOSIS — D6481 Anemia due to antineoplastic chemotherapy: Secondary | ICD-10-CM | POA: Insufficient documentation

## 2021-01-25 DIAGNOSIS — R5381 Other malaise: Secondary | ICD-10-CM | POA: Diagnosis not present

## 2021-01-25 DIAGNOSIS — C50511 Malignant neoplasm of lower-outer quadrant of right female breast: Secondary | ICD-10-CM | POA: Diagnosis not present

## 2021-01-25 DIAGNOSIS — Z803 Family history of malignant neoplasm of breast: Secondary | ICD-10-CM | POA: Insufficient documentation

## 2021-01-25 DIAGNOSIS — R5383 Other fatigue: Secondary | ICD-10-CM | POA: Insufficient documentation

## 2021-01-25 DIAGNOSIS — Z171 Estrogen receptor negative status [ER-]: Secondary | ICD-10-CM | POA: Diagnosis not present

## 2021-01-25 DIAGNOSIS — F418 Other specified anxiety disorders: Secondary | ICD-10-CM | POA: Insufficient documentation

## 2021-01-25 DIAGNOSIS — C50112 Malignant neoplasm of central portion of left female breast: Secondary | ICD-10-CM | POA: Insufficient documentation

## 2021-01-25 DIAGNOSIS — Z5111 Encounter for antineoplastic chemotherapy: Secondary | ICD-10-CM

## 2021-01-25 DIAGNOSIS — G893 Neoplasm related pain (acute) (chronic): Secondary | ICD-10-CM | POA: Diagnosis not present

## 2021-01-25 DIAGNOSIS — R531 Weakness: Secondary | ICD-10-CM | POA: Insufficient documentation

## 2021-01-25 DIAGNOSIS — Z923 Personal history of irradiation: Secondary | ICD-10-CM | POA: Insufficient documentation

## 2021-01-25 DIAGNOSIS — Z9013 Acquired absence of bilateral breasts and nipples: Secondary | ICD-10-CM | POA: Diagnosis not present

## 2021-01-25 DIAGNOSIS — C787 Secondary malignant neoplasm of liver and intrahepatic bile duct: Secondary | ICD-10-CM | POA: Diagnosis not present

## 2021-01-25 DIAGNOSIS — R6883 Chills (without fever): Secondary | ICD-10-CM | POA: Diagnosis not present

## 2021-01-25 DIAGNOSIS — C50919 Malignant neoplasm of unspecified site of unspecified female breast: Secondary | ICD-10-CM

## 2021-01-25 DIAGNOSIS — Z5189 Encounter for other specified aftercare: Secondary | ICD-10-CM | POA: Diagnosis not present

## 2021-01-25 DIAGNOSIS — Z79899 Other long term (current) drug therapy: Secondary | ICD-10-CM | POA: Diagnosis not present

## 2021-01-25 DIAGNOSIS — G4700 Insomnia, unspecified: Secondary | ICD-10-CM | POA: Insufficient documentation

## 2021-01-25 DIAGNOSIS — M8580 Other specified disorders of bone density and structure, unspecified site: Secondary | ICD-10-CM | POA: Diagnosis not present

## 2021-01-25 LAB — COMPREHENSIVE METABOLIC PANEL
ALT: 28 U/L (ref 0–44)
AST: 30 U/L (ref 15–41)
Albumin: 3.4 g/dL — ABNORMAL LOW (ref 3.5–5.0)
Alkaline Phosphatase: 133 U/L — ABNORMAL HIGH (ref 38–126)
Anion gap: 10 (ref 5–15)
BUN: 14 mg/dL (ref 8–23)
CO2: 26 mmol/L (ref 22–32)
Calcium: 8.8 mg/dL — ABNORMAL LOW (ref 8.9–10.3)
Chloride: 103 mmol/L (ref 98–111)
Creatinine, Ser: 0.82 mg/dL (ref 0.44–1.00)
GFR, Estimated: >60 mL/min (ref >60)
Glucose, Bld: 119 mg/dL — ABNORMAL HIGH (ref 70–99)
Potassium: 4 mmol/L (ref 3.5–5.1)
Sodium: 139 mmol/L (ref 135–145)
Total Bilirubin: 0.6 mg/dL (ref 0.3–1.2)
Total Protein: 6.8 g/dL (ref 6.5–8.1)

## 2021-01-25 LAB — CBC WITH DIFFERENTIAL/PLATELET
Abs Immature Granulocytes: 0.49 10*3/uL — ABNORMAL HIGH (ref 0.00–0.07)
Basophils Absolute: 0.1 10*3/uL (ref 0.0–0.1)
Basophils Relative: 0 %
Eosinophils Absolute: 0.1 10*3/uL (ref 0.0–0.5)
Eosinophils Relative: 1 %
HCT: 31.6 % — ABNORMAL LOW (ref 36.0–46.0)
Hemoglobin: 9.9 g/dL — ABNORMAL LOW (ref 12.0–15.0)
Immature Granulocytes: 3 %
Lymphocytes Relative: 6 %
Lymphs Abs: 1 10*3/uL (ref 0.7–4.0)
MCH: 29.1 pg (ref 26.0–34.0)
MCHC: 31.3 g/dL (ref 30.0–36.0)
MCV: 92.9 fL (ref 80.0–100.0)
Monocytes Absolute: 2.2 10*3/uL — ABNORMAL HIGH (ref 0.1–1.0)
Monocytes Relative: 13 %
Neutro Abs: 12.7 10*3/uL — ABNORMAL HIGH (ref 1.7–7.7)
Neutrophils Relative %: 77 %
Platelets: 242 10*3/uL (ref 150–400)
RBC: 3.4 MIL/uL — ABNORMAL LOW (ref 3.87–5.11)
RDW: 17.6 % — ABNORMAL HIGH (ref 11.5–15.5)
WBC: 16.4 10*3/uL — ABNORMAL HIGH (ref 4.0–10.5)
nRBC: 0 % (ref 0.0–0.2)

## 2021-01-25 MED ORDER — GEMCITABINE HCL CHEMO INJECTION 1 GM/26.3ML
1800.0000 mg | Freq: Once | INTRAVENOUS | Status: AC
  Administered 2021-01-25: 1800 mg via INTRAVENOUS
  Filled 2021-01-25: qty 26.3

## 2021-01-25 MED ORDER — HEPARIN SOD (PORK) LOCK FLUSH 100 UNIT/ML IV SOLN
500.0000 [IU] | Freq: Once | INTRAVENOUS | Status: AC | PRN
  Administered 2021-01-25: 500 [IU]
  Filled 2021-01-25: qty 5

## 2021-01-25 MED ORDER — SODIUM CHLORIDE 0.9 % IV SOLN: 4.0000 mg | Freq: Once | INTRAVENOUS | Status: DC

## 2021-01-25 MED ORDER — SODIUM CHLORIDE 0.9 % IV SOLN
213.6000 mg | Freq: Once | INTRAVENOUS | Status: AC
  Administered 2021-01-25: 210 mg via INTRAVENOUS
  Filled 2021-01-25: qty 21

## 2021-01-25 MED ORDER — DEXAMETHASONE SODIUM PHOSPHATE 10 MG/ML IJ SOLN
4.0000 mg | Freq: Once | INTRAMUSCULAR | Status: AC
  Administered 2021-01-25: 4 mg via INTRAVENOUS
  Filled 2021-01-25: qty 1

## 2021-01-25 MED ORDER — HEPARIN SOD (PORK) LOCK FLUSH 100 UNIT/ML IV SOLN
INTRAVENOUS | Status: AC
  Filled 2021-01-25: qty 5

## 2021-01-25 MED ORDER — PALONOSETRON HCL INJECTION 0.25 MG/5ML
0.2500 mg | Freq: Once | INTRAVENOUS | Status: AC
  Administered 2021-01-25: 0.25 mg via INTRAVENOUS
  Filled 2021-01-25: qty 5

## 2021-01-25 MED ORDER — SODIUM CHLORIDE 0.9 % IV SOLN
Freq: Once | INTRAVENOUS | Status: AC
  Filled 2021-01-25: qty 250

## 2021-01-25 MED ORDER — LIDOCAINE-PRILOCAINE 2.5-2.5 % EX CREA: TOPICAL_CREAM | CUTANEOUS | 3 refills | Status: DC

## 2021-01-25 NOTE — Progress Notes (Signed)
Hematology/Oncology Consult note [facility]  Telephone:([phone] [phone]  Patient Care Team: Tonia Ghent, MD as PCP - General (Family Medicine) Bary Castilla, Forest Gleason, MD (General Surgery) Modesto Charon, MD (Family Medicine) Laneta Simmers as Physician Assistant (Urology) [name] Javier Docker, MD as Consulting Physician (Cardiology) Sindy Guadeloupe, MD as Consulting Physician (Hematology and Oncology) Noreene Filbert, MD as Referring Physician (Radiation Oncology)   Name of the patient: Debra Little  [id number]  Dec 24, 1955   Date of visit: 01/25/21  Diagnosis- 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 in the past and has undergone breast biopsies which did not previously showed malignancy. More recently patient noted some red discoloration around her right perioral as well as a palpable mass which led to a diagnostic mammogram on the right side her prior mammogram in February 2020 was normal in August 2020 she was noted to have a 2.7 x 2 x 2.3 cm mass in her right breast along with 4 morphologically abnormal lymph nodes. She underwent breast biopsy as well as lymph node biopsy which showed grade 3 invasive mammary carcinoma. ER PR and HER-2/neunegative.Sections show high-grade invasive carcinoma with focal squamous differentiation and pinpoint keratin ideation. The features are concerning for metaplastic differentiation.[name] Lymphovascular invasion present. There was extranodal extension present on the lymph node biopsy.   Her family history is significant for breast cancer in her mother in her 34s. Father had colon cancer in his70s.Family history also significant for ovarian cancer in her maternal  aunt.  MRI of bilateral breasts showed: Primary right breast mass was 3.5 x 2.7 x 3.4 cm. There were surrounding numerous nodules consistent with satellite lesions. Extensive nodular non-mass enhancement throughout the right breast involving the upper and lower quadrants spanning 6.2 x 4.5 x 5.5 cm. Markedly enhancement of the left breast diffusely. Non-mass enhancement measures 1.9 x 2.4 cm. At least 3 abnormal lymph nodes in the right axilla. No abnormal left axillary lymph nodes. 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 Equivocal inferior breast and left axillary nodal hypermetabolism. No evidence of extrathoracic hypermetabolic metastases. Lateral right breast primary with right axillary nodal metastases  Patient had 3 cycles of neoadjuvant AC chemotherapy along with Keytruda. There was no significant response in her tumor and patient proceeded with bilateral mastectomy with targeted node dissection. 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 7 lymph nodes were positive for malignancy with extranodal extension overall cancer cellularity was 40% [id number].Patient completed 1 cycle of adjuvant AC chemotherapy and willcomplete weekly Taxol chemotherapy in April 2021. She completed post mastectomy radiation  PET scan in November 2021 was done for ongoing chest wall pain. It showed:1. Aggressive appearing and extensive recurrent right breast cancer with right chest wall involvement as detailed above. 2. No findings for mediastinal/hilar metastatic lymphadenopathy or metastatic pulmonary disease. 3. Single left hepatic lobe liver lesion consistent with metastatic focus. 4. Hypermetabolic soft tissue mass in the left oropharynx/tonsillar region suspicious for primary neoplasm.  NGS testing showed PIK3CA mutation and PD-L1 CPS  score of 95. No other actionable mutation was seen.    Interval history-patient reports having generalized body aches especially during her week off chemotherapy which  lasts for about 4 to 5 days.  She has been mainly using Tylenol and Advil for this.  Continues to have dull aching pain in her right arm as well.  She has chronic lymphedema involving the right upper extremity for which she sees Luna Fuse and has a lymphedema sleeve in place.  Appetite is good and patient would ideally like to lose some weight.  She has not been using much of her Dilaudid as it makes her feel sleepy.  Also reports dry unproductive cough  ECOG PS- 1 Pain scale- 0  Review of systems- Review of Systems  Constitutional: Positive for malaise/fatigue. Negative for chills, fever and weight loss.       Body aches  HENT: Negative for congestion, ear discharge and nosebleeds.   Eyes: Negative for blurred vision.  Respiratory: Positive for cough. Negative for hemoptysis, sputum production, shortness of breath and wheezing.   Cardiovascular: Negative for chest pain, palpitations, orthopnea and claudication.  Gastrointestinal: Negative for abdominal pain, blood in stool, constipation, diarrhea, heartburn, melena, nausea and vomiting.  Genitourinary: Negative for dysuria, flank pain, frequency, hematuria and urgency.  Musculoskeletal: Negative for back pain, joint pain and myalgias.  Skin: Negative for rash.  Neurological: Negative for dizziness, tingling, focal weakness, seizures, weakness and headaches.  Endo/Heme/Allergies: Does not bruise/bleed easily.  Psychiatric/Behavioral: Negative for depression and suicidal ideas. The patient does not have insomnia.        Allergies  Allergen Reactions  . Amitiza [Lubiprostone] Other (See Comments)    Overly effective at higher dose  . Bentyl [Dicyclomine Hcl]     Lack of effect  . Cortisone Other (See Comments)    flush  . Mobic [Meloxicam]     Palpitations.   But can tolerate aleve  . Nitrofurantoin Monohyd Macro   . Sulfonamide Derivatives Nausea Only  . Zoloft [Sertraline Hcl] Diarrhea and Nausea Only  . Buspar [Buspirone] Palpitations  . Celecoxib Rash    REACTION: unspecified  . Doxycycline Palpitations    chest pain  . Penicillins Rash     Past Medical History:  Diagnosis Date  . Anemia    d/t chemo - last Hgb 10.8  . Anxiety    sees Dr. [name]  . BRCA negative    Inv[facility]ae panel neg except CHEK2 VUS  . Breast cancer ([place]) 2020  . Cyst of breast    per [name]  . Dysrhythmia    Hx of palpitations  . Family history of breast cancer   . Family history of colon cancer   . Family history of leukemia   . Family history of ovarian cancer   . Fibromyalgia   . GERD (gastroesophageal reflux disease)   . Heart murmur   . Hemorrhoids   . Herpes, genital 04/2015   confirmed with HSV 2 IgG  . Hiatal hernia   . Hypercholesterolemia    Dr. Kyra Searles  . Hyperlipidemia   . IBS (irritable bowel syndrome)    constipation predominant  . IC (interstitial cystitis)    per [facility]  . Mild depression ([place])   . MVP (mitral valve prolapse)    [facility] cards eval 2012  . Osteopenia    2010/2017, DEXA at [facility];spine and fem neck  . PONV (postoperative nausea and vomiting)   . Vitamin D deficiency    history of     Past Surgical History:  Procedure Laterality Date  . ABDOMINAL HYSTERECTOMY    . APPENDECTOMY    . BLADDER SURGERY  1980's  . BREAST EXCISIONAL BIOPSY Right    benign  . BREAST EXCISIONAL BIOPSY Bilateral    benign  . COLONOSCOPY  09/2014   Dr. [name]  . COLONOSCOPY  01/17/2008   at [facility] 1 POLYP (BENIGN)  . excision of breast cysts     hx of multiple cyst aspirations  . EXPLORATORY LAPAROTOMY    . MASTECTOMY MODIFIED RADICAL Bilateral 10/09/2019   Procedure: RIGHT MASTECTOMY MODIFIED RADICAL AND LEFT TOTAL MASTECTOMY;  Surgeon: Rolm Bookbinder, MD;  Location: [place];  Service: General;  Laterality:  Bilateral;  . PORTA CATH INSERTION N/A 08/06/2019   Procedure: PORTA CATH INSERTION;  Surgeon: Katha Cabal, MD;  Location: [place] [facility];  Service: Cardiovascular;  Laterality: N/A;  . PORTACATH PLACEMENT Left 08/05/2019   Procedure: INSERTION PORT-A-CATH, Attempted;  Surgeon: Jules Husbands, MD;  Location: [facility];  Service: General;  Laterality: Left;  . TUBAL LIGATION      Social History   Socioeconomic History  . Marital status: Married    Spouse name: Not on file  . Number of children: 1  . Years of education: Not on file  . Highest education level: Not on file  Occupational History  . Occupation: [employer]  Tobacco Use  . Smoking status: Never Smoker  . Smokeless tobacco: Never Used  Vaping Use  . Vaping Use: Never used  Substance and Sexual Activity  . Alcohol use: No    Alcohol/week: 0.0 standard drinks  . Drug use: No  . Sexual activity: Yes    Birth control/protection: Surgical    Comment: Hysterectomy  Other Topics Concern  . Not on file  Social History Narrative   Married in 01/16/97, divorced as of 01/16/17, 1 son from previous relationship   Brother with MVA at 54, in rest home for many years as of Jan 16, 2018- died of covid recently   Social Determinants of Radio broadcast assistant Strain: Orange   . Difficulty of Paying Living Expenses: Not hard at all  Food Insecurity: No Food Insecurity  . Worried About Charity fundraiser in the Last Year: Never true  . Ran Out of Food in the Last Year: Never true  Transportation Needs: No Transportation Needs  . Lack of Transportation (Medical): No  . Lack of Transportation (Non-Medical): No  Physical Activity: Inactive  . Days of Exercise per Week: 0 days  . Minutes of Exercise per Session: 0 min  Stress: No Stress Concern Present  . Feeling of Stress : Not at all  Social Connections: Not on file  Intimate Partner Violence: Not At Risk  . Fear of Current or Ex-Partner: No  . Emotionally Abused: No  .  Physically Abused: No  . Sexually Abused: No    Family History  Problem Relation Age of Onset  . Breast cancer Mother 55  . Hypertension Mother   . Colon cancer Father 29  . Hyperlipidemia Father   . Ovarian cancer Maternal Aunt 80  . Breast cancer Cousin   . Gallbladder disease Paternal Grandmother   . Liver cancer Cousin 70       Malignant  . Cancer Maternal Uncle        unsure type     Current Outpatient Medications:  .  acetaminophen (TYLENOL) 650 MG CR tablet, Take 650 mg by mouth every 8 (eight) hours as needed for pain., Disp: , Rfl:  .  ascorbic acid (VITAMIN C) 500 MG tablet, Take 500 mg by mouth  daily., Disp: , Rfl:  .  atorvastatin (LIPITOR) 40 MG tablet, Take 40 mg by mouth at bedtime. , Disp: , Rfl: 1 .  Calcium Carbonate-Vit D-Min (CALCIUM 1200 PO), Take 4 tablets by mouth daily., Disp: , Rfl:  .  cholecalciferol (VITAMIN D3) 25 MCG (1000 UNIT) tablet, Take 1 tablet (1,000 Units total) by mouth daily., Disp: , Rfl:  .  clonazePAM (KLONOPIN) 0.5 MG tablet, Take 0.5 mg by mouth 2 (two) times daily. , Disp: , Rfl:  .  dexamethasone (DECADRON) 4 MG tablet, Take 2 tablets (8 mg total) by mouth daily. Start the day after chemotherapy for 2 days. Take with food., Disp: 30 tablet, Rfl: 1 .  Dexlansoprazole 30 MG capsule, Take 1 capsule (30 mg total) by mouth daily., Disp: 30 capsule, Rfl: 6 .  DULoxetine (CYMBALTA) 60 MG capsule, Take 60 mg by mouth daily. , Disp: , Rfl:  .  furosemide (LASIX) 20 MG tablet, Take 1 tablet (20 mg total) by mouth every other day., Disp: , Rfl:  .  HYDROmorphone HCl (DILAUDID) 1 MG/ML LIQD, Take 0.5 mLs (0.5 mg total) by mouth every 4 (four) hours as needed for severe pain., Disp: 90 mL, Rfl: 0 .  lactulose (CHRONULAC) 10 GM/15ML solution, Take 45 mLs (30 g total) by mouth 2 (two) times daily., Disp: 236 mL, Rfl: 1 .  loratadine (CLARITIN) 10 MG tablet, Take 1 tablet (10 mg total) by mouth daily., Disp: , Rfl:  .  NON FORMULARY, Take 5 mLs by  mouth 4 (four) times daily as needed. Swish and swallow 5-10 ml of Medicated Mouthwash 4 x per day as needed, Disp: , Rfl:  .  OLANZapine (ZYPREXA) 10 MG tablet, Take 1 tablet (10 mg total) by mouth at bedtime. For nausea or vomiting, Disp: 30 tablet, Rfl: 0 .  ondansetron (ZOFRAN) 8 MG tablet, Take 1 tablet (8 mg total) by mouth 2 (two) times daily as needed for refractory nausea / vomiting. Start on day 3 after chemotherapy., Disp: 30 tablet, Rfl: 1 .  Plecanatide (TRULANCE) 3 MG TABS, Take 3 mg by mouth daily., Disp: 30 tablet, Rfl: 11 .  polyethylene glycol (MIRALAX / GLYCOLAX) 17 g packet, Take 17 g by mouth daily., Disp: , Rfl:  .  pregabalin (LYRICA) 75 MG capsule, Take 1 capsule (75 mg total) by mouth 2 (two) times daily., Disp: 60 capsule, Rfl: 2 .  prochlorperazine (COMPAZINE) 10 MG tablet, Take 1 tablet (10 mg total) by mouth every 6 (six) hours as needed (Nausea or vomiting)., Disp: 30 tablet, Rfl: 1 .  sennosides-docusate sodium (SENOKOT-S) 8.6-50 MG tablet, Take 1-2 tablets by mouth at bedtime., Disp: , Rfl:  .  sucralfate (CARAFATE) 1 g tablet, Take 1 g by mouth 3 (three) times daily., Disp: , Rfl:  .  triamcinolone (KENALOG) 0.1 %, Apply 1 application topically 2 (two) times daily as needed. For itching, Disp: 80 g, Rfl: 0 .  valACYclovir (VALTREX) 500 MG tablet, Take 1 tablet (500 mg total) by mouth daily., Disp: 90 tablet, Rfl: 3 .  vitamin B-12 (CYANOCOBALAMIN) 500 MCG tablet, Take 500 mcg by mouth daily., Disp: , Rfl:  .  lidocaine-prilocaine (EMLA) cream, Apply to affected area once, Disp: 30 g, Rfl: 3 No current facility-administered medications for this visit.  Facility-Administered Medications Ordered in Other Visits:  .  CARBOplatin (PARAPLATIN) 210 mg in sodium chloride 0.9 % 100 mL chemo infusion, 210 mg, Intravenous, Once, Sindy Guadeloupe, MD .  gemcitabine (GEMZAR) 1,800 mg  in sodium chloride 0.9 % 250 mL chemo infusion, 1,800 mg, Intravenous, Once, Sindy Guadeloupe, MD,  Last Rate: 595 mL/hr at 01/25/21 1203, 1,800 mg at 01/25/21 1203 .  heparin lock flush 100 unit/mL, 500 Units, Intracatheter, Once PRN, Sindy Guadeloupe, MD  Physical exam:  Vitals:   01/25/21 1030  BP: 123/68  Pulse: 89  Resp: 16  Temp: 98.9 F (37.2 C)  TempSrc: Tympanic  SpO2: 98%  Weight: 168 lb 4.8 oz (76.3 kg)   Physical Exam Constitutional:      General: She is not in acute distress. Eyes:     Extraocular Movements: EOM normal.  Pulmonary:     Effort: Pulmonary effort is normal.  Skin:    General: Skin is warm and dry.  Neurological:     Mental Status: She is alert and oriented to person, place, and time.      CMP Latest Ref Rng & Units 01/25/2021  Glucose 70 - 99 mg/dL 119(H)  BUN 8 - 23 mg/dL 14  Creatinine 0.44 - 1.00 mg/dL 0.82  Sodium 135 - 145 mmol/L 139  Potassium 3.5 - 5.1 mmol/L 4.0  Chloride 98 - 111 mmol/L 103  CO2 22 - 32 mmol/L 26  Calcium 8.9 - 10.3 mg/dL 8.8(L)  Total Protein 6.5 - 8.1 g/dL 6.8  Total Bilirubin 0.3 - 1.2 mg/dL 0.6  Alkaline Phos 38 - 126 U/L 133(H)  AST 15 - 41 U/L 30  ALT 0 - 44 U/L 28   CBC Latest Ref Rng & Units 01/25/2021  WBC 4.0 - 10.5 K/uL 16.4(H)  Hemoglobin 12.0 - 15.0 g/dL 9.9(L)  Hematocrit 36.0 - 46.0 % 31.6(L)  Platelets 150 - 400 K/uL 242      Assessment and plan- Patient is a 65 y.o. female with metastatic triple negative metaplastic breast cancer with lymph node bone and liver metastases.   She is here for on treatment assessment prior to cycle 6-day 1 of carboplatin gemcitabine chemotherapy  Counts okay to proceed with cycle 6-day 1 of carboplatin and gemcitabine chemotherapy today.  She will directly proceed for [employer] next week and I will see her back in 3 weeks for cycle 7.  Plan to repeat scans after 7 cycles.  If her [place] is greater than 5 in 1 week it would be okay to hold on pro-Neulasta.  Dry cough: We discussed starting Hydromet/Hycodan down the line if cough starts getting worse.  Body  aches: Patient states that she has had this even when she does not get Neulasta.  I have encouraged her to try as needed Tylenol in addition to Tylenol and Advil.  Normocytic anemia: Likely chemo induced.  TIBC is normal and iron saturation is low indicating of anemia of chronic disease.  We will hold off on giving her IV iron at this time.  If anemia continues to worsen we may have to reduce the dose of chemo or offer it less frequently.   Visit Diagnosis 1. Antineoplastic chemotherapy induced anemia   2. Encounter for antineoplastic chemotherapy   3. Metastatic breast cancer [facility])      Dr. Randa Evens, MD, MPH [facility] at [facility] [phone] 01/25/2021 12:12 PM

## 2021-01-26 ENCOUNTER — Other Ambulatory Visit: Payer: Self-pay

## 2021-01-26 ENCOUNTER — Telehealth: Payer: Self-pay | Admitting: *Deleted

## 2021-01-26 ENCOUNTER — Other Ambulatory Visit: Payer: Medicare Other

## 2021-01-26 ENCOUNTER — Inpatient Hospital Stay (HOSPITAL_BASED_OUTPATIENT_CLINIC_OR_DEPARTMENT_OTHER): Payer: Medicare Other | Admitting: Oncology

## 2021-01-26 ENCOUNTER — Inpatient Hospital Stay: Payer: Medicare Other | Admitting: Oncology

## 2021-01-26 DIAGNOSIS — C50511 Malignant neoplasm of lower-outer quadrant of right female breast: Secondary | ICD-10-CM

## 2021-01-26 DIAGNOSIS — Z171 Estrogen receptor negative status [ER-]: Secondary | ICD-10-CM

## 2021-01-26 DIAGNOSIS — M791 Myalgia, unspecified site: Secondary | ICD-10-CM

## 2021-01-26 NOTE — Telephone Encounter (Signed)
Patient called reporting that she had discussed with Dr Janese Banks yesterday that she was having body aches all over. She reports that she received chemotherapy treatment yesterday and last night was to worse the aching has gotten. She reports her body aches like she has to flu and worsens as the day goes on. She felt hot and had low grade fever oh 99.7 last night as well as a headache. She thinks she may have an infection somewhere and is asking if she needs antibiotics. She denies and problems with bladder abdomen or lungs, no coughing.  She does admit that the aching is worse in her right shoulder. She knows that Dr Janese Banks is off and requests Beckey Rutter, NP receive this message.Please advise  Assessment and plan- Patient is a 65 y.o. female with metastatic triple negative metaplastic breast cancer with lymph node bone and liver metastases.  She is here for on treatment assessment prior to cycle 6-day 1 of carboplatin gemcitabine chemotherapy  Counts okay to proceed with cycle 6-day 1 of carboplatin and gemcitabine chemotherapy today.  She will directly proceed for [employer] next week and I will see her back in 3 weeks for cycle 7.  Plan to repeat scans after 7 cycles.  If her [place] is greater than 5 in 1 week it would be okay to hold on pro-Neulasta.  Dry cough: We discussed starting Hydromet/Hycodan down the line if cough starts getting worse.  Body aches: Patient states that she has had this even when she does not get Neulasta.  I have encouraged her to try as needed Tylenol in addition to Tylenol and Advil.  Normocytic anemia: Likely chemo induced.  TIBC is normal and iron saturation is low indicating of anemia of chronic disease.  We will hold off on giving her IV iron at this time.  If anemia continues to worsen we may have to reduce the dose of chemo or offer it less frequently.   Visit Diagnosis 1. Antineoplastic chemotherapy induced anemia   2. Encounter for antineoplastic chemotherapy    3. Metastatic breast cancer [facility])      Dr. Randa Evens, MD, MPH [facility] at [facility] [phone] 01/25/2021 12:12 PM

## 2021-01-26 NOTE — Telephone Encounter (Signed)
Sorry to hear this. I can see her in [facility] tomorrow or she can see Sonia Baller today. Labs (cbc, cmp, ua), [facility], possible infusion.

## 2021-01-26 NOTE — Progress Notes (Signed)
Virtual Visit via Telephone Note  I connected with Debra Little on 01/26/21 at  1:15 PM EST by telephone and verified that I am speaking with the correct person using two identifiers.  Location: Patient: Home Provider: Clinic    I discussed the limitations, risks, security and privacy concerns of performing an evaluation and management service by telephone and the availability of in person appointments. I also discussed with the patient that there may be a patient responsible charge related to this service. The patient expressed understanding and agreed to proceed.   History of Present Illness: Debra Little is a 65 year old female with past medical history significant for recurrent metaplastic triple negative right breast cancer.  She has had multiple lines of treatment secondary to progression of disease.  She is currently status post 6 cycles of gemcitabine/carbo plus Keytruda.  Her last treatment was yesterday.  Labs were fairly stable except for mild leukocytosis and anemia secondary to on pro-Neulasta and chemo.  Today, she complains of generalized body aches for the past 1 to 2 months.  Symptoms appear to have worsened last night after her treatment.  States she was diagnosed with fibromyalgia many years ago and her muscle aches have worsened with her treatments.  She has tried Tylenol arthritis, hydromorphone 1 mg at bedtime (reluctantly) and Claritin.  She was recently switched from gabapentin to Lyrica for neuropathic pain.  She started taking her steroids (8 mg total) today after her treatment.  She takes Klonopin 0.5 mg at bedtime for sleep.  She is eating and drinking well.  She denies any nausea or vomiting.  Denies any constipation or diarrhea.  Denies any abdominal pain.  Had a low-grade temperature last night of 99.3.    Observations/Objective: Review of Systems  Constitutional: Positive for chills, fever (99.3) and malaise/fatigue.  Musculoskeletal: Positive for myalgias.   Neurological: Positive for weakness.   Physical Exam Neurological:     Mental Status: She is alert and oriented to person, place, and time.    Assessment and Plan: Recurrent metastatic triple negative metaplastic right breast cancer: -Status post several lines of treatment. -PET from 12/09/2020 showed mixed response to treatment. -Currently status post 6 cycles of carbo/gemcitabine plus Keytruda.  -Last treatment given on 01/25/2021.  Labs stable.  Mild leukocytosis secondary to on pro-Neulasta. -She is scheduled to return to clinic next week for cycle 6, day 8.   Generalized body pain: -Likely multifactorial due to cumulative side effects from treatments -This has been present for several months. -She is currently taking Lyrica 75 mg twice daily. -She has been compliant with Claritin and Tylenol for on pro-Neulasta prophylaxis. -She has been taking Tylenol arthritis, Dilaudid 1 mg w/out relief of her symptoms. -Recommend restarting olanzapine 10 mg at bedtime.  If this is not effective, would recommend trazodone.   Insomnia/anxiety/depression: -Continue Cymbalta 60 mg daily. -Continue Klonopin 0.5 mg tablets twice daily.  Follow Up Instructions: -We will call patient tomorrow to follow-up. -Keep all scheduled appointments as is.   I discussed the assessment and treatment plan with the patient. The patient was provided an opportunity to ask questions and all were answered. The patient agreed with the plan and demonstrated an understanding of the instructions.   The patient was advised to call back or seek an in-person evaluation if the symptoms worsen or if the condition fails to improve as anticipated.  I provided 25 minutes of non-face-to-face time during this encounter.   Jacquelin Hawking, NP

## 2021-01-26 NOTE — Progress Notes (Incomplete)
Virtual Visit via Video Note  I connected with Debra Little on 01/26/21 at  2:15 PM EST by a video enabled telemedicine application and verified that I am speaking with the correct person using two identifiers.  Location: Patient: home Provider: Clinic    I discussed the limitations of evaluation and management by telemedicine and the availability of in person appointments. The patient expressed understanding and agreed to proceed.  History of Present Illness: Debra Little is a 65 year old female with past medical history significant for mitral valve prolapse, GERD, neuralgia, hyperlipidemia, anxiety and depression, B12 deficiency who is followed by Dr. 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  She is currently s/p 6 cycles of carbo/gemcitabine.  Her last treatment was yesterday.  She had labs drawn yesterday which were fairly stable.  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  Complaints today she is asking me how however she and I said to and she said that 1070-minute clear talking will be 1000 and will use them consistently for the right was not expecting a during the collar he had a drop multifocal given last week.  WBC 16.4.  Hemoglobin 9.9 secondary to treatment.  Today, she complains of body aches x1 month.  Symptoms worsened last night.  Reports 4 weeks ago reviewed your feel.  Normal Broadnax hemoglobin yeah urogram MRI study but should have acceptable BG because of blood counts were low but there are no real EKG for several more often than the started 113 8 rather, so Debra Little she was just seen yesterday via I called her because I am not can have her come back in for lab work when she was little he just here yesterday  she said she is got major anxiety and like Debra Little placed states she is got his body aches and she had a regular month so now managed by my presented that way her nurse who wrote it down out of it now but she said called her and admitted she is on Dilaudid if she wants to take that he denies to get Lyrica she is got like everything I did this on and she still wanted to come 9 hours but we welcome to come and has it [place] labs other than her eating and drinking she is like well to the remainder As you sounds well as he is motor last night was probably worse because you are on steroids continue all those treatments if I can sleep got all of her joint pain already already had fibromyalgia exacerbated with treatment times would like a MRI for them to come in    Observations/Objective:   Assessment and Plan:   Follow Up Instructions:    I discussed the assessment and treatment plan with the patient. The patient was provided an opportunity to ask questions and all were answered. The patient agreed with the plan and demonstrated an understanding of the instructions.   The patient was advised to call back or seek an in-person evaluation if the symptoms worsen or if the condition fails to improve as anticipated.  I provided *** minutes of non-face-to-face time during this encounter.   Jacquelin Hawking, NP

## 2021-01-26 NOTE — Telephone Encounter (Signed)
Patient notified and prefers to come in today. Labs and [facility] visit scheduled for 2pm. Patient verbalized understanding.

## 2021-02-01 ENCOUNTER — Other Ambulatory Visit: Payer: Self-pay

## 2021-02-01 ENCOUNTER — Other Ambulatory Visit: Payer: Self-pay | Admitting: *Deleted

## 2021-02-01 ENCOUNTER — Inpatient Hospital Stay (HOSPITAL_BASED_OUTPATIENT_CLINIC_OR_DEPARTMENT_OTHER): Payer: Medicare Other

## 2021-02-01 ENCOUNTER — Ambulatory Visit: Payer: Medicare Other

## 2021-02-01 ENCOUNTER — Inpatient Hospital Stay (HOSPITAL_BASED_OUTPATIENT_CLINIC_OR_DEPARTMENT_OTHER): Payer: Medicare Other | Admitting: Nurse Practitioner

## 2021-02-01 VITALS — BP 143/75 | HR 100 | Temp 99.2°F | Resp 18 | Wt 168.4 lb

## 2021-02-01 DIAGNOSIS — Z5189 Encounter for other specified aftercare: Secondary | ICD-10-CM | POA: Diagnosis not present

## 2021-02-01 DIAGNOSIS — Z923 Personal history of irradiation: Secondary | ICD-10-CM | POA: Diagnosis not present

## 2021-02-01 DIAGNOSIS — C50511 Malignant neoplasm of lower-outer quadrant of right female breast: Secondary | ICD-10-CM | POA: Diagnosis not present

## 2021-02-01 DIAGNOSIS — G893 Neoplasm related pain (acute) (chronic): Secondary | ICD-10-CM

## 2021-02-01 DIAGNOSIS — D6481 Anemia due to antineoplastic chemotherapy: Secondary | ICD-10-CM | POA: Diagnosis not present

## 2021-02-01 DIAGNOSIS — T451X5A Adverse effect of antineoplastic and immunosuppressive drugs, initial encounter: Secondary | ICD-10-CM | POA: Diagnosis not present

## 2021-02-01 DIAGNOSIS — Z5111 Encounter for antineoplastic chemotherapy: Secondary | ICD-10-CM | POA: Diagnosis not present

## 2021-02-01 DIAGNOSIS — Z171 Estrogen receptor negative status [ER-]: Secondary | ICD-10-CM | POA: Diagnosis not present

## 2021-02-01 DIAGNOSIS — C787 Secondary malignant neoplasm of liver and intrahepatic bile duct: Secondary | ICD-10-CM | POA: Diagnosis not present

## 2021-02-01 DIAGNOSIS — R5381 Other malaise: Secondary | ICD-10-CM | POA: Diagnosis not present

## 2021-02-01 DIAGNOSIS — Z803 Family history of malignant neoplasm of breast: Secondary | ICD-10-CM | POA: Diagnosis not present

## 2021-02-01 DIAGNOSIS — R531 Weakness: Secondary | ICD-10-CM | POA: Diagnosis not present

## 2021-02-01 DIAGNOSIS — Z79899 Other long term (current) drug therapy: Secondary | ICD-10-CM | POA: Diagnosis not present

## 2021-02-01 DIAGNOSIS — R6883 Chills (without fever): Secondary | ICD-10-CM | POA: Diagnosis not present

## 2021-02-01 DIAGNOSIS — C50112 Malignant neoplasm of central portion of left female breast: Secondary | ICD-10-CM | POA: Diagnosis not present

## 2021-02-01 DIAGNOSIS — M8580 Other specified disorders of bone density and structure, unspecified site: Secondary | ICD-10-CM | POA: Diagnosis not present

## 2021-02-01 DIAGNOSIS — R5383 Other fatigue: Secondary | ICD-10-CM | POA: Diagnosis not present

## 2021-02-01 DIAGNOSIS — Z9013 Acquired absence of bilateral breasts and nipples: Secondary | ICD-10-CM | POA: Diagnosis not present

## 2021-02-01 DIAGNOSIS — G4700 Insomnia, unspecified: Secondary | ICD-10-CM | POA: Diagnosis not present

## 2021-02-01 MED ORDER — SODIUM CHLORIDE 0.9 % IV SOLN
210.0000 mg | Freq: Once | INTRAVENOUS | Status: AC
Start: 2021-02-01 — End: 2021-02-01
  Administered 2021-02-01: 210 mg via INTRAVENOUS
  Filled 2021-02-01: qty 21

## 2021-02-01 MED ORDER — PALONOSETRON HCL INJECTION 0.25 MG/5ML
0.2500 mg | Freq: Once | INTRAVENOUS | Status: AC
  Administered 2021-02-01: 0.25 mg via INTRAVENOUS
  Filled 2021-02-01: qty 5

## 2021-02-01 MED ORDER — SODIUM CHLORIDE 0.9 % IV SOLN
4.0000 mg | Freq: Once | INTRAVENOUS | Status: DC
  Filled 2021-02-01: qty 0.4

## 2021-02-01 MED ORDER — HEPARIN SOD (PORK) LOCK FLUSH 100 UNIT/ML IV SOLN
INTRAVENOUS | Status: AC
  Filled 2021-02-01: qty 5

## 2021-02-01 MED ORDER — HEPARIN SOD (PORK) LOCK FLUSH 100 UNIT/ML IV SOLN
500.0000 [IU] | Freq: Once | INTRAVENOUS | Status: AC | PRN
  Administered 2021-02-01: 500 [IU]
  Filled 2021-02-01: qty 5

## 2021-02-01 MED ORDER — SODIUM CHLORIDE 0.9% FLUSH
10.0000 mL | INTRAVENOUS | Status: DC | PRN
  Administered 2021-02-01: 10 mL
  Filled 2021-02-01: qty 10

## 2021-02-01 MED ORDER — SODIUM CHLORIDE 0.9 % IV SOLN
200.0000 mg | Freq: Once | INTRAVENOUS | Status: AC
  Administered 2021-02-01: 200 mg via INTRAVENOUS
  Filled 2021-02-01: qty 8

## 2021-02-01 MED ORDER — PEGFILGRASTIM 6 MG/0.6ML ~~LOC~~ PSKT
6.0000 mg | PREFILLED_SYRINGE | Freq: Once | SUBCUTANEOUS | Status: AC
  Administered 2021-02-01: 6 mg via SUBCUTANEOUS
  Filled 2021-02-01: qty 0.6

## 2021-02-01 MED ORDER — FENTANYL 12 MCG/HR TD PT72: 1.0000 | MEDICATED_PATCH | TRANSDERMAL | 0 refills | Status: DC

## 2021-02-01 MED ORDER — SODIUM CHLORIDE 0.9 % IV SOLN
Freq: Once | INTRAVENOUS | Status: AC
  Filled 2021-02-01: qty 250

## 2021-02-01 MED ORDER — DEXAMETHASONE SODIUM PHOSPHATE 10 MG/ML IJ SOLN
4.0000 mg | Freq: Once | INTRAMUSCULAR | Status: AC
  Administered 2021-02-01: 4 mg via INTRAVENOUS
  Filled 2021-02-01: qty 1

## 2021-02-01 MED ORDER — SODIUM CHLORIDE 0.9 % IV SOLN
1800.0000 mg | Freq: Once | INTRAVENOUS | Status: AC
  Administered 2021-02-01: 1800 mg via INTRAVENOUS
  Filled 2021-02-01: qty 26.3

## 2021-02-01 NOTE — Progress Notes (Signed)
Per Ander Purpura, NP - ok to proceed with tx today through port. No blood return noted, flushes easily with no signs of infiltration.

## 2021-02-01 NOTE — Progress Notes (Signed)
Pt received Derald Macleod tx in clinic today. Tolerated well.

## 2021-02-03 NOTE — Progress Notes (Signed)
Symptom Management Clinic [facility]  Telephone:([phone]) [phone] [phone]  Patient Care Team: Joaquim Nam, MD as PCP - General (Family Medicine) Lemar Livings, Merrily Pew, MD (General Surgery) Arta Silence, MD (Family Medicine) Harle Battiest, PA-C as Physician Assistant (Urology) Dalia Heading, MD as Consulting Physician (Cardiology) Creig Hines, MD as Consulting Physician (Hematology and Oncology) Carmina Miller, MD as Referring Physician (Radiation Oncology)   Name of the patient: Debra Little  [id number]  Aug 29, 1956   Date of visit: 02/03/21  Diagnosis-triple negative breast cancer  Chief complaint/ Reason for visit-myalgias  Heme/Onc history:  Oncology History  Breast cancer (HCC)  07/30/2019 Initial Diagnosis   Breast cancer (HCC)   08/06/2019 Cancer Staging   Staging form: Breast, AJCC 8th Edition - Clinical stage from 08/06/2019: Stage IIIB (cT2, cN1, cM0, G3, ER-, PR-, HER2-) - Signed by Creig Hines, MD on 08/07/2019   08/11/2019 - 03/02/2020 Chemotherapy   The patient had dexamethasone (DECADRON) 4 MG tablet, 1 of 1 cycle, Start date: 08/07/2019, End date: 04/20/2020 DOXOrubicin (ADRIAMYCIN) chemo injection 106 mg, 60 mg/m2 = 106 mg, Intravenous,  Once, 4 of 4 cycles Administration: 106 mg (08/11/2019), 106 mg (09/01/2019), 100 mg (09/22/2019), 100 mg (12/02/2019) palonosetron (ALOXI) injection 0.25 mg, 0.25 mg, Intravenous,  Once, 7 of 7 cycles Administration: 0.25 mg (08/11/2019), 0.25 mg (12/16/2019), 0.25 mg (01/06/2020), 0.25 mg (09/01/2019), 0.25 mg (12/23/2019), 0.25 mg (12/30/2019), 0.25 mg (09/22/2019), 0.25 mg (12/02/2019), 0.25 mg (01/20/2020), 0.25 mg (01/27/2020) pegfilgrastim-cbqv (UDENYCA) injection 6 mg, 6 mg, Subcutaneous, Once, 4 of 4 cycles Administration: 6 mg (08/12/2019), 6 mg (09/02/2019), 6 mg (09/23/2019), 6 mg (12/03/2019) cyclophosphamide (CYTOXAN) 1,060 mg in sodium chloride 0.9 % 250 mL chemo infusion, 600 mg/m2 = 1,060  mg, Intravenous,  Once, 4 of 4 cycles Administration: 1,000 mg (09/01/2019), 1,000 mg (09/22/2019), 1,000 mg (12/02/2019) PACLitaxel (TAXOL) 138 mg in sodium chloride 0.9 % 250 mL chemo infusion (</= 80mg /m2), 80 mg/m2 = 138 mg, Intravenous,  Once, 4 of 4 cycles Dose modification: 65 mg/m2 (original dose 80 mg/m2, Cycle 6, Reason: Provider Judgment) Administration: 138 mg (12/16/2019), 138 mg (12/23/2019), 114 mg (01/13/2020), 138 mg (12/30/2019), 114 mg (02/03/2020), 114 mg (01/20/2020), 114 mg (01/27/2020), 114 mg (02/10/2020), 114 mg (02/17/2020), 114 mg (02/24/2020), 114 mg (03/02/2020) pembrolizumab (KEYTRUDA) 200 mg in sodium chloride 0.9 % 50 mL chemo infusion, 200 mg (100 % of original dose 200 mg), Intravenous, Once, 3 of 3 cycles Dose modification: 200 mg (original dose 200 mg, Cycle 1, Reason: Provider Judgment) Administration: 200 mg (08/11/2019), 200 mg (09/01/2019), 200 mg (09/22/2019) fosaprepitant (EMEND) 150 mg, dexamethasone (DECADRON) 12 mg in sodium chloride 0.9 % 145 mL IVPB, , Intravenous,  Once, 4 of 4 cycles Administration:  (08/11/2019),  (09/01/2019),  (09/22/2019),  (12/02/2019)  for chemotherapy treatment.    08/26/2019 Genetic Testing   Negative genetic testing. VUS in CHEK2 called c.556A>C identified on the [employer] Common Hereditary Cancers Panel. The Common Hereditary Cancers Panel offered by [facility] includes sequencing and/or deletion duplication testing of the following 48 genes: APC, ATM, AXIN2, BARD1, BMPR1A, BRCA1, BRCA2, BRIP1, CDH1, CDKN2A (p14ARF), CDKN2A (p16INK4a), CKD4, CHEK2, CTNNA1, DICER1, EPCAM (Deletion/duplication testing only), GREM1 (promoter region deletion/duplication testing only), KIT, MEN1, MLH1, MSH2, MSH3, MSH6, MUTYH, NBN, NF1, NHTL1, PALB2, PDGFRA, PMS2, POLD1, POLE, PTEN, RAD50, RAD51C, RAD51D, RNF43, SDHB, SDHC, SDHD, SMAD4, SMARCA4. STK11, TP53, TSC1, TSC2, and VHL.  The following genes were evaluated for sequence changes only: SDHA and HOXB13 c.251G>A variant only.  The report date is 08/26/2019.    10/21/2019 Cancer Staging   Staging form: Breast, AJCC 8th Edition - Pathologic stage from 10/21/2019: Stage IIIC (pT2, pN2a, cM0, G3, ER-, PR-, HER2-) - Signed by Sindy Guadeloupe, MD on 10/27/2019   10/12/2020 -  Chemotherapy    Patient is on Treatment Plan: BREAST CARBOPLATIN / GEMCITABINE D1,8 Q21D        Interval history-patient is 65 year old female currently undergoing chemotherapy for recurrent triple negative breast cancer.  She presents to symptom management clinic for complaints of all over body aches and pains and low-grade fever.  Worse after her chemotherapy.  Bothersome and interferes with her quality of life.  Pain persists despite hydromorphone.  Some relief with Tylenol.  Concern for underlying etiology.  Pain interferes with sleep.  Otherwise she continues to feel at baseline and has been tolerating her chemotherapy well.  No dizziness or weakness.  Appetite is stable.  No chest pain or shortness of breath.  No nausea, vomiting, constipation, diarrhea.  No urinary complaints.  Review of systems- Review of Systems  Constitutional: Positive for malaise/fatigue. Negative for chills, fever and weight loss.  HENT: Negative for hearing loss, nosebleeds, sore throat and tinnitus.   Eyes: Negative for blurred vision and double vision.  Respiratory: Negative for cough, hemoptysis, shortness of breath and wheezing.   Cardiovascular: Negative for chest pain, palpitations and leg swelling.  Gastrointestinal: Negative for abdominal pain, blood in stool, constipation, diarrhea, melena, nausea and vomiting.  Genitourinary: Negative for dysuria and urgency.  Musculoskeletal: Positive for myalgias. Negative for back pain, falls and joint pain.       Chronic lymphedema of right arm; wearing brace  Skin: Negative for itching and rash.  Neurological: Negative for dizziness, tingling, sensory change, loss of consciousness, weakness and headaches.   Endo/Heme/Allergies: Negative for environmental allergies. Does not bruise/bleed easily.  Psychiatric/Behavioral: Positive for depression. The patient is nervous/anxious. The patient does not have insomnia.      Allergies  Allergen Reactions  . Amitiza [Lubiprostone] Other (See Comments)    Overly effective at higher dose  . Bentyl [Dicyclomine Hcl]     Lack of effect  . Cortisone Other (See Comments)    flush  . Mobic [Meloxicam]     Palpitations.  But can tolerate aleve  . Nitrofurantoin Monohyd Macro   . Sulfonamide Derivatives Nausea Only  . Zoloft [Sertraline Hcl] Diarrhea and Nausea Only  . Buspar [Buspirone] Palpitations  . Celecoxib Rash    REACTION: unspecified  . Doxycycline Palpitations    chest pain  . Penicillins Rash    Past Medical History:  Diagnosis Date  . Anemia    d/t chemo - last Hgb 10.8  . Anxiety    sees Dr. [name]  . BRCA negative    [facility] panel neg except CHEK2 VUS  . Breast cancer ([place]) 2020  . Cyst of breast    per [name]  . Dysrhythmia    Hx of palpitations  . Family history of breast cancer   . Family history of colon cancer   . Family history of leukemia   . Family history of ovarian cancer   . Fibromyalgia   . GERD (gastroesophageal reflux disease)   . Heart murmur   . Hemorrhoids   . Herpes, genital 04/2015   confirmed with HSV 2 IgG  . Hiatal hernia   . Hypercholesterolemia    Dr. Kyra Searles  . Hyperlipidemia   . IBS (irritable bowel syndrome)  constipation predominant  . IC (interstitial cystitis)    per [facility]  . Mild depression ([place])   . MVP (mitral valve prolapse)    [facility] cards eval 2011/03/04  . Osteopenia    2010/2017, DEXA at [facility];spine and fem neck  . PONV (postoperative nausea and vomiting)   . Vitamin D deficiency    history of    Past Surgical History:  Procedure Laterality Date  . ABDOMINAL HYSTERECTOMY    . APPENDECTOMY    . BLADDER SURGERY     1980's  . BREAST EXCISIONAL BIOPSY Right     benign  . BREAST EXCISIONAL BIOPSY Bilateral    benign  . COLONOSCOPY  09/2014   Dr. [name]  . COLONOSCOPY  Mar 03, 2008   at [facility] 1 POLYP (BENIGN)  . excision of breast cysts     hx of multiple cyst aspirations  . EXPLORATORY LAPAROTOMY    . MASTECTOMY MODIFIED RADICAL Bilateral 10/09/2019   Procedure: RIGHT MASTECTOMY MODIFIED RADICAL AND LEFT TOTAL MASTECTOMY;  Surgeon: Rolm Bookbinder, MD;  Location: [place];  Service: General;  Laterality: Bilateral;  . PORTA CATH INSERTION N/A 08/06/2019   Procedure: PORTA CATH INSERTION;  Surgeon: Katha Cabal, MD;  Location: [facility];  Service: Cardiovascular;  Laterality: N/A;  . PORTACATH PLACEMENT Left 08/05/2019   Procedure: INSERTION PORT-A-CATH, Attempted;  Surgeon: Jules Husbands, MD;  Location: [facility];  Service: General;  Laterality: Left;  . TUBAL LIGATION      Social History   Socioeconomic History  . Marital status: Married    Spouse name: Not on file  . Number of children: 1  . Years of education: Not on file  . Highest education level: Not on file  Occupational History  . Occupation: [employer]  Tobacco Use  . Smoking status: Never Smoker  . Smokeless tobacco: Never Used  Vaping Use  . Vaping Use: Never used  Substance and Sexual Activity  . Alcohol use: No    Alcohol/week: 0.0 standard drinks  . Drug use: No  . Sexual activity: Yes    Birth control/protection: Surgical    Comment: Hysterectomy  Other Topics Concern  . Not on file  Social History Narrative   Married in 03-03-97, divorced as of 03-03-17, 1 son from previous relationship   Brother with MVA at 36, in rest home for many years as of March 03, 2018- died of covid recently   Social Determinants of Radio broadcast assistant Strain: [place]   . Difficulty of Paying Living Expenses: Not hard at all  Food Insecurity: No Food Insecurity  . Worried About Charity fundraiser in the Last Year: Never true  . Ran Out of Food in the Last Year: Never true   Transportation Needs: No Transportation Needs  . Lack of Transportation (Medical): No  . Lack of Transportation (Non-Medical): No  Physical Activity: Inactive  . Days of Exercise per Week: 0 days  . Minutes of Exercise per Session: 0 min  Stress: No Stress Concern Present  . Feeling of Stress : Not at all  Social Connections: Not on file  Intimate Partner Violence: Not At Risk  . Fear of Current or Ex-Partner: No  . Emotionally Abused: No  . Physically Abused: No  . Sexually Abused: No    Family History  Problem Relation Age of Onset  . Breast cancer Mother 32  . Hypertension Mother   . Colon cancer Father 33  . Hyperlipidemia Father   .  Ovarian cancer Maternal Aunt 80  . Breast cancer Cousin   . Gallbladder disease Paternal Grandmother   . Liver cancer Cousin 70       Malignant  . Cancer Maternal Uncle        unsure type     Current Outpatient Medications:  .  acetaminophen (TYLENOL) 650 MG CR tablet, Take 650 mg by mouth every 8 (eight) hours as needed for pain., Disp: , Rfl:  .  ascorbic acid (VITAMIN C) 500 MG tablet, Take 500 mg by mouth daily., Disp: , Rfl:  .  atorvastatin (LIPITOR) 40 MG tablet, Take 40 mg by mouth at bedtime. , Disp: , Rfl: 1 .  Calcium Carbonate-Vit D-Min (CALCIUM 1200 PO), Take 4 tablets by mouth daily., Disp: , Rfl:  .  cholecalciferol (VITAMIN D3) 25 MCG (1000 UNIT) tablet, Take 1 tablet (1,000 Units total) by mouth daily., Disp: , Rfl:  .  clonazePAM (KLONOPIN) 0.5 MG tablet, Take 0.5 mg by mouth 2 (two) times daily. , Disp: , Rfl:  .  dexamethasone (DECADRON) 4 MG tablet, Take 2 tablets (8 mg total) by mouth daily. Start the day after chemotherapy for 2 days. Take with food., Disp: 30 tablet, Rfl: 1 .  Dexlansoprazole 30 MG capsule, Take 1 capsule (30 mg total) by mouth daily., Disp: 30 capsule, Rfl: 6 .  DULoxetine (CYMBALTA) 60 MG capsule, Take 60 mg by mouth daily. , Disp: , Rfl:  .  fentaNYL (DURAGESIC) 12 MCG/HR, Place 1 patch onto the  skin every 3 (three) days., Disp: 10 patch, Rfl: 0 .  furosemide (LASIX) 20 MG tablet, Take 1 tablet (20 mg total) by mouth every other day., Disp: , Rfl:  .  HYDROmorphone HCl (DILAUDID) 1 MG/ML LIQD, Take 0.5 mLs (0.5 mg total) by mouth every 4 (four) hours as needed for severe pain., Disp: 90 mL, Rfl: 0 .  lactulose (CHRONULAC) 10 GM/15ML solution, Take 45 mLs (30 g total) by mouth 2 (two) times daily., Disp: 236 mL, Rfl: 1 .  lidocaine-prilocaine (EMLA) cream, Apply to affected area once, Disp: 30 g, Rfl: 3 .  loratadine (CLARITIN) 10 MG tablet, Take 1 tablet (10 mg total) by mouth daily., Disp: , Rfl:  .  NON FORMULARY, Take 5 mLs by mouth 4 (four) times daily as needed. Swish and swallow 5-10 ml of Medicated Mouthwash 4 x per day as needed, Disp: , Rfl:  .  OLANZapine (ZYPREXA) 10 MG tablet, Take 1 tablet (10 mg total) by mouth at bedtime. For nausea or vomiting, Disp: 30 tablet, Rfl: 0 .  ondansetron (ZOFRAN) 8 MG tablet, Take 1 tablet (8 mg total) by mouth 2 (two) times daily as needed for refractory nausea / vomiting. Start on day 3 after chemotherapy., Disp: 30 tablet, Rfl: 1 .  Plecanatide (TRULANCE) 3 MG TABS, Take 3 mg by mouth daily., Disp: 30 tablet, Rfl: 11 .  polyethylene glycol (MIRALAX / GLYCOLAX) 17 g packet, Take 17 g by mouth daily., Disp: , Rfl:  .  pregabalin (LYRICA) 75 MG capsule, Take 1 capsule (75 mg total) by mouth 2 (two) times daily., Disp: 60 capsule, Rfl: 2 .  prochlorperazine (COMPAZINE) 10 MG tablet, Take 1 tablet (10 mg total) by mouth every 6 (six) hours as needed (Nausea or vomiting)., Disp: 30 tablet, Rfl: 1 .  sennosides-docusate sodium (SENOKOT-S) 8.6-50 MG tablet, Take 1-2 tablets by mouth at bedtime., Disp: , Rfl:  .  sucralfate (CARAFATE) 1 g tablet, Take 1 g by mouth 3 (  three) times daily., Disp: , Rfl:  .  triamcinolone (KENALOG) 0.1 %, Apply 1 application topically 2 (two) times daily as needed. For itching, Disp: 80 g, Rfl: 0 .  valACYclovir (VALTREX)  500 MG tablet, Take 1 tablet (500 mg total) by mouth daily., Disp: 90 tablet, Rfl: 3 .  vitamin B-12 (CYANOCOBALAMIN) 500 MCG tablet, Take 500 mcg by mouth daily., Disp: , Rfl:   Physical exam: There were no vitals filed for this visit. - vitals reviewed under infusion encounter Physical Exam Constitutional:      Appearance: She is well-developed. She is not ill-appearing.  HENT:     Head: Atraumatic.     Nose: Nose normal.     Mouth/Throat:     Pharynx: No oropharyngeal exudate.  Eyes:     General: No scleral icterus.    Conjunctiva/sclera: Conjunctivae normal.  Cardiovascular:     Rate and Rhythm: Normal rate and regular rhythm.  Pulmonary:     Effort: Pulmonary effort is normal. No respiratory distress.  Abdominal:     General: There is no distension.     Palpations: Abdomen is soft.     Tenderness: There is no abdominal tenderness.  Musculoskeletal:        General: No deformity. Normal range of motion.     Cervical back: Normal range of motion and neck supple.     Comments: Right arm swelling- right hand pale, cool to touch. Pulse 2+  Skin:    General: Skin is warm and dry.  Neurological:     Mental Status: She is alert and oriented to person, place, and time.     Motor: No weakness.  Psychiatric:        Mood and Affect: Mood normal.        Behavior: Behavior normal.      CMP Latest Ref Rng & Units 01/25/2021  Glucose 70 - 99 mg/dL 119(H)  BUN 8 - 23 mg/dL 14  Creatinine 0.44 - 1.00 mg/dL 0.82  Sodium 135 - 145 mmol/L 139  Potassium 3.5 - 5.1 mmol/L 4.0  Chloride 98 - 111 mmol/L 103  CO2 22 - 32 mmol/L 26  Calcium 8.9 - 10.3 mg/dL 8.8(L)  Total Protein 6.5 - 8.1 g/dL 6.8  Total Bilirubin 0.3 - 1.2 mg/dL 0.6  Alkaline Phos 38 - 126 U/L 133(H)  AST 15 - 41 U/L 30  ALT 0 - 44 U/L 28   CBC Latest Ref Rng & Units 01/25/2021  WBC 4.0 - 10.5 K/uL 16.4(H)  Hemoglobin 12.0 - 15.0 g/dL 9.9(L)  Hematocrit 36.0 - 46.0 % 31.6(L)  Platelets 150 - 400 K/uL 242    No  images are attached to the encounter.  No results found.  Assessment and plan- Patient is a 65 y.o. female currently undergoing chemotherapy for recurrent triple negative right breast cancer.  Complains of myalgias today. Refractory to tylenol and hydromorphone. Intermittent low grade fevers (99). Suspect symptoms related to chemotherapy and effect on bone marrow vs malignancy vs gcsf. Will add long acting pain medication with low dose fentanyl 12 mcg/hr q72 hours with continued hydromorphone and tylenol for breakthrough pain. Expect to up titrate fentanyl to response. Bowel prophylaxis with narcotics. Continue antihistamine.   Return to clinic if symptoms don't improve or worsen in the interim.   Prior authorization completed for fentanyl.    Visit Diagnosis 1. Cancer associated pain    Patient expressed understanding and was in agreement with this plan. She also understands that She can  call clinic at any time with any questions, concerns, or complaints.   Thank you for allowing me to participate in the care of this very pleasant patient.   Beckey Rutter, DNP, AGNP-C [facility]  CC: Dr. Janese Banks

## 2021-02-04 ENCOUNTER — Other Ambulatory Visit: Payer: Self-pay

## 2021-02-04 ENCOUNTER — Ambulatory Visit
Admission: RE | Admit: 2021-02-04 | Discharge: 2021-02-04 | Disposition: A | Discharge status: Home / Self Care | Payer: Medicare Other | Source: Ambulatory Visit | Attending: Nurse Practitioner | Admitting: Nurse Practitioner

## 2021-02-04 ENCOUNTER — Encounter: Payer: Self-pay | Admitting: Nurse Practitioner

## 2021-02-04 ENCOUNTER — Telehealth: Payer: Self-pay | Admitting: *Deleted

## 2021-02-04 ENCOUNTER — Inpatient Hospital Stay (HOSPITAL_BASED_OUTPATIENT_CLINIC_OR_DEPARTMENT_OTHER): Payer: Medicare Other | Admitting: Nurse Practitioner

## 2021-02-04 ENCOUNTER — Other Ambulatory Visit: Payer: Self-pay | Admitting: *Deleted

## 2021-02-04 ENCOUNTER — Inpatient Hospital Stay: Payer: Medicare Other

## 2021-02-04 ENCOUNTER — Telehealth: Payer: Self-pay | Admitting: Nurse Practitioner

## 2021-02-04 ENCOUNTER — Other Ambulatory Visit: Payer: Self-pay | Admitting: Nurse Practitioner

## 2021-02-04 VITALS — BP 117/52 | HR 92 | Temp 97.1°F | Resp 18

## 2021-02-04 DIAGNOSIS — R600 Localized edema: Secondary | ICD-10-CM | POA: Insufficient documentation

## 2021-02-04 DIAGNOSIS — R5383 Other fatigue: Secondary | ICD-10-CM | POA: Diagnosis not present

## 2021-02-04 DIAGNOSIS — M79601 Pain in right arm: Secondary | ICD-10-CM | POA: Diagnosis not present

## 2021-02-04 DIAGNOSIS — Z79899 Other long term (current) drug therapy: Secondary | ICD-10-CM | POA: Diagnosis not present

## 2021-02-04 DIAGNOSIS — Z5111 Encounter for antineoplastic chemotherapy: Secondary | ICD-10-CM | POA: Diagnosis not present

## 2021-02-04 DIAGNOSIS — C50919 Malignant neoplasm of unspecified site of unspecified female breast: Secondary | ICD-10-CM

## 2021-02-04 DIAGNOSIS — Z923 Personal history of irradiation: Secondary | ICD-10-CM | POA: Diagnosis not present

## 2021-02-04 DIAGNOSIS — Z9013 Acquired absence of bilateral breasts and nipples: Secondary | ICD-10-CM | POA: Diagnosis not present

## 2021-02-04 DIAGNOSIS — Z171 Estrogen receptor negative status [ER-]: Secondary | ICD-10-CM | POA: Diagnosis not present

## 2021-02-04 DIAGNOSIS — R6883 Chills (without fever): Secondary | ICD-10-CM | POA: Diagnosis not present

## 2021-02-04 DIAGNOSIS — Z803 Family history of malignant neoplasm of breast: Secondary | ICD-10-CM | POA: Diagnosis not present

## 2021-02-04 DIAGNOSIS — M8580 Other specified disorders of bone density and structure, unspecified site: Secondary | ICD-10-CM | POA: Diagnosis not present

## 2021-02-04 DIAGNOSIS — G4700 Insomnia, unspecified: Secondary | ICD-10-CM | POA: Diagnosis not present

## 2021-02-04 DIAGNOSIS — C50511 Malignant neoplasm of lower-outer quadrant of right female breast: Secondary | ICD-10-CM | POA: Diagnosis not present

## 2021-02-04 DIAGNOSIS — C787 Secondary malignant neoplasm of liver and intrahepatic bile duct: Secondary | ICD-10-CM | POA: Diagnosis not present

## 2021-02-04 DIAGNOSIS — G893 Neoplasm related pain (acute) (chronic): Secondary | ICD-10-CM | POA: Diagnosis not present

## 2021-02-04 DIAGNOSIS — C50112 Malignant neoplasm of central portion of left female breast: Secondary | ICD-10-CM | POA: Diagnosis not present

## 2021-02-04 DIAGNOSIS — R5381 Other malaise: Secondary | ICD-10-CM | POA: Diagnosis not present

## 2021-02-04 DIAGNOSIS — R531 Weakness: Secondary | ICD-10-CM | POA: Diagnosis not present

## 2021-02-04 DIAGNOSIS — D6481 Anemia due to antineoplastic chemotherapy: Secondary | ICD-10-CM | POA: Diagnosis not present

## 2021-02-04 DIAGNOSIS — Z5189 Encounter for other specified aftercare: Secondary | ICD-10-CM | POA: Diagnosis not present

## 2021-02-04 DIAGNOSIS — T451X5A Adverse effect of antineoplastic and immunosuppressive drugs, initial encounter: Secondary | ICD-10-CM | POA: Diagnosis not present

## 2021-02-04 LAB — BASIC METABOLIC PANEL
Anion gap: 9 (ref 5–15)
BUN: 18 mg/dL (ref 8–23)
CO2: 25 mmol/L (ref 22–32)
Calcium: 9.1 mg/dL (ref 8.9–10.3)
Chloride: 105 mmol/L (ref 98–111)
Creatinine, Ser: 0.8 mg/dL (ref 0.44–1.00)
GFR, Estimated: >60 mL/min (ref >60)
Glucose, Bld: 77 mg/dL (ref 70–99)
Potassium: 3.7 mmol/L (ref 3.5–5.1)
Sodium: 139 mmol/L (ref 135–145)

## 2021-02-04 LAB — CBC WITH DIFFERENTIAL/PLATELET
Abs Immature Granulocytes: 2.4 10*3/uL — ABNORMAL HIGH (ref 0.00–0.07)
Band Neutrophils: 12 %
Basophils Absolute: 0 10*3/uL (ref 0.0–0.1)
Basophils Relative: 0 %
Eosinophils Absolute: 0 10*3/uL (ref 0.0–0.5)
Eosinophils Relative: 0 %
HCT: 27 % — ABNORMAL LOW (ref 36.0–46.0)
Hemoglobin: 8.7 g/dL — ABNORMAL LOW (ref 12.0–15.0)
Lymphocytes Relative: 1 %
Lymphs Abs: 0.6 10*3/uL — ABNORMAL LOW (ref 0.7–4.0)
MCH: 30.1 pg (ref 26.0–34.0)
MCHC: 32.2 g/dL (ref 30.0–36.0)
MCV: 93.4 fL (ref 80.0–100.0)
Metamyelocytes Relative: 4 %
Monocytes Absolute: 1.8 10*3/uL — ABNORMAL HIGH (ref 0.1–1.0)
Monocytes Relative: 3 %
Neutro Abs: 55.6 10*3/uL — ABNORMAL HIGH (ref 1.7–7.7)
Neutrophils Relative %: 80 %
Platelets: 131 10*3/uL — ABNORMAL LOW (ref 150–400)
RBC: 2.89 MIL/uL — ABNORMAL LOW (ref 3.87–5.11)
RDW: 17 % — ABNORMAL HIGH (ref 11.5–15.5)
Smear Review: NORMAL
WBC: 60.4 10*3/uL (ref 4.0–10.5)
nRBC: 0 % (ref 0.0–0.2)

## 2021-02-04 MED ORDER — SODIUM CHLORIDE 0.9% FLUSH
10.0000 mL | INTRAVENOUS | Status: DC | PRN
  Administered 2021-02-04: 10 mL via INTRAVENOUS
  Filled 2021-02-04: qty 10

## 2021-02-04 MED ORDER — HEPARIN SOD (PORK) LOCK FLUSH 100 UNIT/ML IV SOLN
500.0000 [IU] | Freq: Once | INTRAVENOUS | Status: AC
  Administered 2021-02-04: 500 [IU] via INTRAVENOUS
  Filled 2021-02-04: qty 5

## 2021-02-04 NOTE — Telephone Encounter (Signed)
Patient called reporting concerns of shortness of breath and weakness, has to sit and rest when she tries to do anything. She is concerned that her blood levels have dropped and that she did not get labs prior to her chemotherapy on Tuesday. She also reports concern that she has enlarging blood vessels going across her chest from her right arm towards her port. Please advise

## 2021-02-04 NOTE — Telephone Encounter (Signed)
Called pt and offered her 10 am [facility] visit. And she said it would take her longer to get there.

## 2021-02-04 NOTE — Telephone Encounter (Signed)
Lauren- are you able to get a ct chest abdomen pelvis with contrast done asap? We can see her before or after that. Please call her to get a sense if she needs to be seen today or after the scans

## 2021-02-04 NOTE — Telephone Encounter (Signed)
She agreed for 10:30.

## 2021-02-04 NOTE — Telephone Encounter (Signed)
Called patient to review results of ultrasound which was negative for dvt. No answer. Left voicemail.

## 2021-02-04 NOTE — Progress Notes (Addendum)
Symptom Management [place]  [phone] [phone]  Patient Care Team: Tonia Ghent, MD as PCP - General (Family Medicine) Bary Castilla, Forest Gleason, MD (General Surgery) Modesto Charon, MD (Family Medicine) Nori Riis, PA-C as Physician Assistant (Urology) Teodoro Spray, MD as Consulting Physician (Cardiology) Sindy Guadeloupe, MD as Consulting Physician (Hematology and Oncology) Noreene Filbert, MD as Referring Physician (Radiation Oncology)   Name of the patient: Debra Little  [id number]  08-30-1956   Date of visit: 02/04/21  Diagnosis- Breast Cancer  Chief complaint/ Reason for visit- Arm swelling & pain  Heme/Onc history:  Oncology History  Breast cancer ([place])  07/30/2019 Initial Diagnosis   Breast cancer ([place])   08/06/2019 Cancer Staging   Staging form: Breast, AJCC 8th Edition - Clinical stage from 08/06/2019: Stage IIIB (cT2, cN1, cM0, G3, ER-, PR-, HER2-) - Signed by Sindy Guadeloupe, MD on 08/07/2019   08/11/2019 - 03/02/2020 Chemotherapy   The patient had dexamethasone (DECADRON) 4 MG tablet, 1 of 1 cycle, Start date: 08/07/2019, End date: 04/20/2020 DOXOrubicin (ADRIAMYCIN) chemo injection 106 mg, 60 mg/m2 = 106 mg, Intravenous,  Once, 4 of 4 cycles Administration: 106 mg (08/11/2019), 106 mg (09/01/2019), 100 mg (09/22/2019), 100 mg (12/02/2019) palonosetron (ALOXI) injection 0.25 mg, 0.25 mg, Intravenous,  Once, 7 of 7 cycles Administration: 0.25 mg (08/11/2019), 0.25 mg (12/16/2019), 0.25 mg (01/06/2020), 0.25 mg (09/01/2019), 0.25 mg (12/23/2019), 0.25 mg (12/30/2019), 0.25 mg (09/22/2019), 0.25 mg (12/02/2019), 0.25 mg (01/20/2020), 0.25 mg (01/27/2020) pegfilgrastim-cbqv (UDENYCA) injection 6 mg, 6 mg, Subcutaneous, Once, 4 of 4 cycles Administration: 6 mg (08/12/2019), 6 mg (09/02/2019), 6 mg (09/23/2019), 6 mg (12/03/2019) cyclophosphamide (CYTOXAN) 1,060 mg in sodium chloride 0.9 % 250 mL chemo infusion, 600 mg/m2 = 1,060  mg, Intravenous,  Once, 4 of 4 cycles Administration: 1,000 mg (09/01/2019), 1,000 mg (09/22/2019), 1,000 mg (12/02/2019) PACLitaxel (TAXOL) 138 mg in sodium chloride 0.9 % 250 mL chemo infusion (</= 67m/m2), 80 mg/m2 = 138 mg, Intravenous,  Once, 4 of 4 cycles Dose modification: 65 mg/m2 (original dose 80 mg/m2, Cycle 6, Reason: Provider Judgment) Administration: 138 mg (12/16/2019), 138 mg (12/23/2019), 114 mg (01/13/2020), 138 mg (12/30/2019), 114 mg (02/03/2020), 114 mg (01/20/2020), 114 mg (01/27/2020), 114 mg (02/10/2020), 114 mg (02/17/2020), 114 mg (02/24/2020), 114 mg (03/02/2020) pembrolizumab (KEYTRUDA) 200 mg in sodium chloride 0.9 % 50 mL chemo infusion, 200 mg (100 % of original dose 200 mg), Intravenous, Once, 3 of 3 cycles Dose modification: 200 mg (original dose 200 mg, Cycle 1, Reason: Provider Judgment) Administration: 200 mg (08/11/2019), 200 mg (09/01/2019), 200 mg (09/22/2019) fosaprepitant (EMEND) 150 mg, dexamethasone (DECADRON) 12 mg in sodium chloride 0.9 % 145 mL IVPB, , Intravenous,  Once, 4 of 4 cycles Administration:  (08/11/2019),  (09/01/2019),  (09/22/2019),  (12/02/2019)  for chemotherapy treatment.    08/26/2019 Genetic Testing   Negative genetic testing. VUS in CHEK2 called c.556A>C identified on the [facility] Common Hereditary Cancers Panel. The Common Hereditary Cancers Panel offered by [facility] includes sequencing and/or deletion duplication testing of the following 48 genes: APC, ATM, AXIN2, BARD1, BMPR1A, BRCA1, BRCA2, BRIP1, CDH1, CDKN2A (p14ARF), CDKN2A (p16INK4a), CKD4, CHEK2, CTNNA1, DICER1, EPCAM (Deletion/duplication testing only), GREM1 (promoter region deletion/duplication testing only), KIT, MEN1, MLH1, MSH2, MSH3, MSH6, MUTYH, NBN, NF1, NHTL1, PALB2, PDGFRA, PMS2, POLD1, POLE, PTEN, RAD50, RAD51C, RAD51D, RNF43, SDHB, SDHC, SDHD, SMAD4, SMARCA4. STK11, TP53, TSC1, TSC2, and VHL.  The following genes were evaluated for sequence changes only: SDHA and HOXB13  c.251G>A variant only.  The report date is 08/26/2019.    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  Feels her arm is more swollen than normal.  History of chronic lymphedema.  No increased pain.  Is concerned for blood clot she recently had a friend passed away from a blood clot.  Port in left chest.  Feels her right neck is slightly swollen.  Review of systems- Review of Systems  Constitutional: Positive for malaise/fatigue. Negative for chills, fever and weight loss.  HENT: Negative for hearing loss, nosebleeds, sore throat and tinnitus.   Eyes: Negative for blurred vision and double vision.  Respiratory: Negative for cough, hemoptysis, shortness of breath and wheezing.   Cardiovascular: Negative for chest pain, palpitations and leg swelling.  Gastrointestinal: Negative for abdominal pain, blood in stool, constipation, diarrhea, melena, nausea and vomiting.  Genitourinary: Negative for dysuria and urgency.  Musculoskeletal: Positive for myalgias. Negative for back pain, falls and joint pain.       Right arm lymphedema  Skin: Negative for itching and rash.  Neurological: Negative for dizziness, tingling, sensory change, loss of consciousness, weakness and headaches.  Endo/Heme/Allergies: Negative for environmental allergies. Does not bruise/bleed easily.  Psychiatric/Behavioral: Positive for depression. The patient is not nervous/anxious and does not have insomnia.       Allergies   Allergen Reactions  . Amitiza [Lubiprostone] Other (See Comments)    Overly effective at higher dose  . Bentyl [Dicyclomine Hcl]     Lack of effect  . Cortisone Other (See Comments)    flush  . Mobic [Meloxicam]     Palpitations.  But can tolerate aleve  . Nitrofurantoin Monohyd Macro   . Sulfonamide Derivatives Nausea Only  . Zoloft [Sertraline Hcl] Diarrhea and Nausea Only  . Buspar [Buspirone] Palpitations  . Celecoxib Rash    REACTION: unspecified  . Doxycycline Palpitations    chest pain  . Penicillins Rash    Past Medical History:  Diagnosis Date  . Anemia    d/t chemo - last Hgb 10.8  . Anxiety    sees Dr. [name]  . BRCA negative    [facility] panel neg except CHEK2 VUS  . Breast cancer ([place]) 2020  . Cyst of breast    per [name]  . Dysrhythmia    Hx of palpitations  . Family history of breast cancer   . Family history of colon cancer   . Family history of leukemia   . Family history of ovarian cancer   . Fibromyalgia   . GERD (gastroesophageal reflux disease)   . Heart murmur   . Hemorrhoids   . Herpes, genital 04/2015   confirmed with HSV 2 IgG  . Hiatal hernia   . Hypercholesterolemia    Dr. Kyra Searles  . Hyperlipidemia   . IBS (irritable bowel syndrome)    constipation predominant  . IC (interstitial cystitis)    per [facility]  . Mild depression ([place])   . MVP (mitral  valve prolapse)    [facility] cards eval 01/22/11  . Osteopenia    2010/2017, DEXA at [facility];spine and fem neck  . PONV (postoperative nausea and vomiting)   . Vitamin D deficiency    history of    Past Surgical History:  Procedure Laterality Date  . ABDOMINAL HYSTERECTOMY    . APPENDECTOMY    . BLADDER SURGERY     1980's  . BREAST EXCISIONAL BIOPSY Right    benign  . BREAST EXCISIONAL BIOPSY Bilateral    benign  . COLONOSCOPY  09/2014   Dr. [name]  . COLONOSCOPY  01-23-2008   at [facility] 1 POLYP (BENIGN)  . excision of breast cysts     hx of multiple cyst aspirations  .  EXPLORATORY LAPAROTOMY    . MASTECTOMY MODIFIED RADICAL Bilateral 10/09/2019   Procedure: RIGHT MASTECTOMY MODIFIED RADICAL AND LEFT TOTAL MASTECTOMY;  Surgeon: Rolm Bookbinder, MD;  Location: [place];  Service: General;  Laterality: Bilateral;  . PORTA CATH INSERTION N/A 08/06/2019   Procedure: PORTA CATH INSERTION;  Surgeon: Katha Cabal, MD;  Location: [facility] [facility];  Service: Cardiovascular;  Laterality: N/A;  . PORTACATH PLACEMENT Left 08/05/2019   Procedure: INSERTION PORT-A-CATH, Attempted;  Surgeon: Jules Husbands, MD;  Location: [facility];  Service: General;  Laterality: Left;  . TUBAL LIGATION      Social History   Socioeconomic History  . Marital status: Married    Spouse name: Not on file  . Number of children: 1  . Years of education: Not on file  . Highest education level: Not on file  Occupational History  . Occupation: [employer]  Tobacco Use  . Smoking status: Never Smoker  . Smokeless tobacco: Never Used  Vaping Use  . Vaping Use: Never used  Substance and Sexual Activity  . Alcohol use: No    Alcohol/week: 0.0 standard drinks  . Drug use: No  . Sexual activity: Yes    Birth control/protection: Surgical    Comment: Hysterectomy  Other Topics Concern  . Not on file  Social History Narrative   Married in Jan 22, 1997, divorced as of Jan 22, 2017, 1 son from previous relationship   Brother with MVA at 72, in rest home for many years as of 22-Jan-2018- died of covid recently   Social Determinants of Radio broadcast assistant Strain: [place]   . Difficulty of Paying Living Expenses: Not hard at all  Food Insecurity: No Food Insecurity  . Worried About Charity fundraiser in the Last Year: Never true  . Ran Out of Food in the Last Year: Never true  Transportation Needs: No Transportation Needs  . Lack of Transportation (Medical): No  . Lack of Transportation (Non-Medical): No  Physical Activity: Inactive  . Days of Exercise per Week: 0 days  . Minutes of Exercise per  Session: 0 min  Stress: No Stress Concern Present  . Feeling of Stress : Not at all  Social Connections: Not on file  Intimate Partner Violence: Not At Risk  . Fear of Current or Ex-Partner: No  . Emotionally Abused: No  . Physically Abused: No  . Sexually Abused: No    Family History  Problem Relation Age of Onset  . Breast cancer Mother 72  . Hypertension Mother   . Colon cancer Father 30  . Hyperlipidemia Father   . Ovarian cancer Maternal Aunt 80  . Breast cancer Cousin   . Gallbladder disease Paternal Grandmother   . Liver cancer Cousin  70       Malignant  . Cancer Maternal Uncle        unsure type     Current Outpatient Medications:  .  acetaminophen (TYLENOL) 650 MG CR tablet, Take 650 mg by mouth every 8 (eight) hours as needed for pain., Disp: , Rfl:  .  ascorbic acid (VITAMIN C) 500 MG tablet, Take 500 mg by mouth daily., Disp: , Rfl:  .  atorvastatin (LIPITOR) 40 MG tablet, Take 40 mg by mouth at bedtime. , Disp: , Rfl: 1 .  Calcium Carbonate-Vit D-Min (CALCIUM 1200 PO), Take 4 tablets by mouth daily., Disp: , Rfl:  .  cholecalciferol (VITAMIN D3) 25 MCG (1000 UNIT) tablet, Take 1 tablet (1,000 Units total) by mouth daily., Disp: , Rfl:  .  clonazePAM (KLONOPIN) 0.5 MG tablet, Take 0.5 mg by mouth 2 (two) times daily. , Disp: , Rfl:  .  dexamethasone (DECADRON) 4 MG tablet, Take 2 tablets (8 mg total) by mouth daily. Start the day after chemotherapy for 2 days. Take with food., Disp: 30 tablet, Rfl: 1 .  Dexlansoprazole 30 MG capsule, Take 1 capsule (30 mg total) by mouth daily., Disp: 30 capsule, Rfl: 6 .  DULoxetine (CYMBALTA) 60 MG capsule, Take 60 mg by mouth daily. , Disp: , Rfl:  .  fentaNYL (DURAGESIC) 12 MCG/HR, Place 1 patch onto the skin every 3 (three) days., Disp: 10 patch, Rfl: 0 .  furosemide (LASIX) 20 MG tablet, Take 1 tablet (20 mg total) by mouth every other day., Disp: , Rfl:  .  HYDROmorphone HCl (DILAUDID) 1 MG/ML LIQD, Take 0.5 mLs (0.5 mg  total) by mouth every 4 (four) hours as needed for severe pain., Disp: 90 mL, Rfl: 0 .  lactulose (CHRONULAC) 10 GM/15ML solution, Take 45 mLs (30 g total) by mouth 2 (two) times daily., Disp: 236 mL, Rfl: 1 .  lidocaine-prilocaine (EMLA) cream, Apply to affected area once, Disp: 30 g, Rfl: 3 .  loratadine (CLARITIN) 10 MG tablet, Take 1 tablet (10 mg total) by mouth daily., Disp: , Rfl:  .  NON FORMULARY, Take 5 mLs by mouth 4 (four) times daily as needed. Swish and swallow 5-10 ml of Medicated Mouthwash 4 x per day as needed, Disp: , Rfl:  .  OLANZapine (ZYPREXA) 10 MG tablet, Take 1 tablet (10 mg total) by mouth at bedtime. For nausea or vomiting, Disp: 30 tablet, Rfl: 0 .  ondansetron (ZOFRAN) 8 MG tablet, Take 1 tablet (8 mg total) by mouth 2 (two) times daily as needed for refractory nausea / vomiting. Start on day 3 after chemotherapy., Disp: 30 tablet, Rfl: 1 .  Plecanatide (TRULANCE) 3 MG TABS, Take 3 mg by mouth daily., Disp: 30 tablet, Rfl: 11 .  polyethylene glycol (MIRALAX / GLYCOLAX) 17 g packet, Take 17 g by mouth daily., Disp: , Rfl:  .  pregabalin (LYRICA) 75 MG capsule, Take 1 capsule (75 mg total) by mouth 2 (two) times daily., Disp: 60 capsule, Rfl: 2 .  prochlorperazine (COMPAZINE) 10 MG tablet, Take 1 tablet (10 mg total) by mouth every 6 (six) hours as needed (Nausea or vomiting)., Disp: 30 tablet, Rfl: 1 .  sennosides-docusate sodium (SENOKOT-S) 8.6-50 MG tablet, Take 1-2 tablets by mouth at bedtime., Disp: , Rfl:  .  sucralfate (CARAFATE) 1 g tablet, Take 1 g by mouth 3 (three) times daily., Disp: , Rfl:  .  triamcinolone (KENALOG) 0.1 %, Apply 1 application topically 2 (two) times daily as needed.  For itching, Disp: 80 g, Rfl: 0 .  valACYclovir (VALTREX) 500 MG tablet, Take 1 tablet (500 mg total) by mouth daily., Disp: 90 tablet, Rfl: 3 .  vitamin B-12 (CYANOCOBALAMIN) 500 MCG tablet, Take 500 mcg by mouth daily., Disp: , Rfl:  No current facility-administered medications  for this visit.  Facility-Administered Medications Ordered in Other Visits:  .  sodium chloride flush (NS) 0.9 % injection 10 mL, 10 mL, Intravenous, PRN, Verlon Au, NP, 10 mL at 02/04/21 1159  Physical exam:  Vitals:   02/04/21 1045  BP: (!) 117/52  Pulse: 92  Resp: 18  Temp: (!) 97.1 F (36.2 C)  TempSrc: Tympanic  SpO2: 100%   Physical Exam Constitutional:      Appearance: She is not ill-appearing.  Cardiovascular:     Pulses: Normal pulses.  Abdominal:     General: There is distension.     Palpations: Abdomen is soft.     Tenderness: There is no abdominal tenderness.  Musculoskeletal:     Cervical back: Tenderness present.  Skin:    Comments: See attached photo  Neurological:     Mental Status: She is alert and oriented to person, place, and time.  Psychiatric:        Mood and Affect: Mood normal.        Behavior: Behavior normal.      CMP Latest Ref Rng & Units 02/04/2021  Glucose 70 - 99 mg/dL 77  BUN 8 - 23 mg/dL 18  Creatinine 0.44 - 1.00 mg/dL 0.80  Sodium 135 - 145 mmol/L 139  Potassium 3.5 - 5.1 mmol/L 3.7  Chloride 98 - 111 mmol/L 105  CO2 22 - 32 mmol/L 25  Calcium 8.9 - 10.3 mg/dL 9.1  Total Protein 6.5 - 8.1 g/dL -  Total Bilirubin 0.3 - 1.2 mg/dL -  Alkaline Phos 38 - 126 U/L -  AST 15 - 41 U/L -  ALT 0 - 44 U/L -   CBC Latest Ref Rng & Units 02/04/2021  WBC 4.0 - 10.5 K/uL 60.4(HH)  Hemoglobin 12.0 - 15.0 g/dL 8.7(L)  Hematocrit 36.0 - 46.0 % 27.0(L)  Platelets 150 - 400 K/uL 131(L)      No results found.  Assessment and plan- Patient is a 65 y.o. 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  Will get ultrasound to evaluate for possible DVT.  If negative, consider CT chest abdomen pelvis to assess response to treatment.  Leukocytosis- secondary to [place].   Follow up based on results. Returnt o clinic if symptoms  don't improve or worsen in the interim.    Visit Diagnosis 1. Metastatic breast cancer ([place])   2. Localized edema     Patient expressed understanding and was in agreement with this plan. She also understands that She can call clinic at any time with any questions, concerns, or complaints.   Thank you for allowing me to participate in the care of this very pleasant patient.   Beckey Rutter, DNP, AGNP-C [facility]

## 2021-02-07 ENCOUNTER — Other Ambulatory Visit: Payer: Medicare Other

## 2021-02-08 ENCOUNTER — Telehealth: Payer: Self-pay | Admitting: Oncology

## 2021-02-08 NOTE — Telephone Encounter (Signed)
Pt returned call from VM left earlier today notifying her of CT scan scheduled on 3/17. Pt states that she will be out of town this day and stated that she would be open to discussing the need to r/s the CT at her appt on 3/22.   Please advise on whether the pt needs the CT done prior to treatment on 3/22.

## 2021-02-09 NOTE — Telephone Encounter (Signed)
We would not be able to get ct before 2/22 md appt. I will put in her next appt  notes to talk about r/s ct . thanks

## 2021-02-10 ENCOUNTER — Ambulatory Visit: Payer: Medicare Other

## 2021-02-15 ENCOUNTER — Other Ambulatory Visit: Payer: Self-pay

## 2021-02-15 ENCOUNTER — Inpatient Hospital Stay (HOSPITAL_BASED_OUTPATIENT_CLINIC_OR_DEPARTMENT_OTHER): Payer: Medicare Other | Admitting: Oncology

## 2021-02-15 ENCOUNTER — Inpatient Hospital Stay: Payer: Medicare Other

## 2021-02-15 ENCOUNTER — Encounter: Payer: Self-pay | Admitting: Oncology

## 2021-02-15 VITALS — BP 132/65 | HR 97 | Temp 96.5°F | Resp 18 | Wt 168.4 lb

## 2021-02-15 DIAGNOSIS — Z5111 Encounter for antineoplastic chemotherapy: Secondary | ICD-10-CM | POA: Diagnosis not present

## 2021-02-15 DIAGNOSIS — D6481 Anemia due to antineoplastic chemotherapy: Secondary | ICD-10-CM

## 2021-02-15 DIAGNOSIS — T451X5A Adverse effect of antineoplastic and immunosuppressive drugs, initial encounter: Secondary | ICD-10-CM

## 2021-02-15 DIAGNOSIS — Z803 Family history of malignant neoplasm of breast: Secondary | ICD-10-CM | POA: Diagnosis not present

## 2021-02-15 DIAGNOSIS — R6883 Chills (without fever): Secondary | ICD-10-CM | POA: Diagnosis not present

## 2021-02-15 DIAGNOSIS — R531 Weakness: Secondary | ICD-10-CM | POA: Diagnosis not present

## 2021-02-15 DIAGNOSIS — R5381 Other malaise: Secondary | ICD-10-CM | POA: Diagnosis not present

## 2021-02-15 DIAGNOSIS — G893 Neoplasm related pain (acute) (chronic): Secondary | ICD-10-CM | POA: Diagnosis not present

## 2021-02-15 DIAGNOSIS — C50511 Malignant neoplasm of lower-outer quadrant of right female breast: Secondary | ICD-10-CM | POA: Diagnosis not present

## 2021-02-15 DIAGNOSIS — R5383 Other fatigue: Secondary | ICD-10-CM | POA: Diagnosis not present

## 2021-02-15 DIAGNOSIS — Z08 Encounter for follow-up examination after completed treatment for malignant neoplasm: Secondary | ICD-10-CM

## 2021-02-15 DIAGNOSIS — Z5189 Encounter for other specified aftercare: Secondary | ICD-10-CM | POA: Diagnosis not present

## 2021-02-15 DIAGNOSIS — Z171 Estrogen receptor negative status [ER-]: Secondary | ICD-10-CM | POA: Diagnosis not present

## 2021-02-15 DIAGNOSIS — G4700 Insomnia, unspecified: Secondary | ICD-10-CM | POA: Diagnosis not present

## 2021-02-15 DIAGNOSIS — M8580 Other specified disorders of bone density and structure, unspecified site: Secondary | ICD-10-CM | POA: Diagnosis not present

## 2021-02-15 DIAGNOSIS — C787 Secondary malignant neoplasm of liver and intrahepatic bile duct: Secondary | ICD-10-CM | POA: Diagnosis not present

## 2021-02-15 DIAGNOSIS — C50112 Malignant neoplasm of central portion of left female breast: Secondary | ICD-10-CM

## 2021-02-15 DIAGNOSIS — Z9013 Acquired absence of bilateral breasts and nipples: Secondary | ICD-10-CM | POA: Diagnosis not present

## 2021-02-15 DIAGNOSIS — Z923 Personal history of irradiation: Secondary | ICD-10-CM | POA: Diagnosis not present

## 2021-02-15 DIAGNOSIS — Z79899 Other long term (current) drug therapy: Secondary | ICD-10-CM | POA: Diagnosis not present

## 2021-02-15 LAB — COMPREHENSIVE METABOLIC PANEL
ALT: 21 U/L (ref 0–44)
AST: 25 U/L (ref 15–41)
Albumin: 3.4 g/dL — ABNORMAL LOW (ref 3.5–5.0)
Alkaline Phosphatase: 108 U/L (ref 38–126)
Anion gap: 8 (ref 5–15)
BUN: 15 mg/dL (ref 8–23)
CO2: 25 mmol/L (ref 22–32)
Calcium: 8.7 mg/dL — ABNORMAL LOW (ref 8.9–10.3)
Chloride: 102 mmol/L (ref 98–111)
Creatinine, Ser: 0.74 mg/dL (ref 0.44–1.00)
GFR, Estimated: >60 mL/min (ref >60)
Glucose, Bld: 111 mg/dL — ABNORMAL HIGH (ref 70–99)
Potassium: 4.1 mmol/L (ref 3.5–5.1)
Sodium: 135 mmol/L (ref 135–145)
Total Bilirubin: 0.5 mg/dL (ref 0.3–1.2)
Total Protein: 7 g/dL (ref 6.5–8.1)

## 2021-02-15 LAB — CBC WITH DIFFERENTIAL/PLATELET
Abs Immature Granulocytes: 0.08 10*3/uL — ABNORMAL HIGH (ref 0.00–0.07)
Basophils Absolute: 0 10*3/uL (ref 0.0–0.1)
Basophils Relative: 0 %
Eosinophils Absolute: 0 10*3/uL (ref 0.0–0.5)
Eosinophils Relative: 0 %
HCT: 29.7 % — ABNORMAL LOW (ref 36.0–46.0)
Hemoglobin: 9.5 g/dL — ABNORMAL LOW (ref 12.0–15.0)
Immature Granulocytes: 1 %
Lymphocytes Relative: 6 %
Lymphs Abs: 0.6 10*3/uL — ABNORMAL LOW (ref 0.7–4.0)
MCH: 29.1 pg (ref 26.0–34.0)
MCHC: 32 g/dL (ref 30.0–36.0)
MCV: 91.1 fL (ref 80.0–100.0)
Monocytes Absolute: 1.6 10*3/uL — ABNORMAL HIGH (ref 0.1–1.0)
Monocytes Relative: 16 %
Neutro Abs: 7.6 10*3/uL (ref 1.7–7.7)
Neutrophils Relative %: 77 %
Platelets: 274 10*3/uL (ref 150–400)
RBC: 3.26 MIL/uL — ABNORMAL LOW (ref 3.87–5.11)
RDW: 17.1 % — ABNORMAL HIGH (ref 11.5–15.5)
WBC: 9.9 10*3/uL (ref 4.0–10.5)
nRBC: 0 % (ref 0.0–0.2)

## 2021-02-15 MED ORDER — SODIUM CHLORIDE 0.9% FLUSH
10.0000 mL | INTRAVENOUS | Status: DC | PRN
  Administered 2021-02-15: 10 mL via INTRAVENOUS
  Filled 2021-02-15: qty 10

## 2021-02-15 MED ORDER — SODIUM CHLORIDE 0.9 % IV SOLN
217.6000 mg | Freq: Once | INTRAVENOUS | Status: AC
  Administered 2021-02-15: 220 mg via INTRAVENOUS
  Filled 2021-02-15: qty 22

## 2021-02-15 MED ORDER — SODIUM CHLORIDE 0.9 % IV SOLN
Freq: Once | INTRAVENOUS | Status: AC
  Filled 2021-02-15: qty 250

## 2021-02-15 MED ORDER — DEXAMETHASONE SODIUM PHOSPHATE 10 MG/ML IJ SOLN
4.0000 mg | Freq: Once | INTRAMUSCULAR | Status: AC
  Administered 2021-02-15: 4 mg via INTRAVENOUS
  Filled 2021-02-15: qty 1

## 2021-02-15 MED ORDER — PALONOSETRON HCL INJECTION 0.25 MG/5ML
0.2500 mg | Freq: Once | INTRAVENOUS | Status: AC
  Administered 2021-02-15: 0.25 mg via INTRAVENOUS
  Filled 2021-02-15: qty 5

## 2021-02-15 MED ORDER — HEPARIN SOD (PORK) LOCK FLUSH 100 UNIT/ML IV SOLN
INTRAVENOUS | Status: AC
  Filled 2021-02-15: qty 5

## 2021-02-15 MED ORDER — SODIUM CHLORIDE 0.9 % IV SOLN
1800.0000 mg | Freq: Once | INTRAVENOUS | Status: AC
  Administered 2021-02-15: 1800 mg via INTRAVENOUS
  Filled 2021-02-15: qty 26.3

## 2021-02-15 MED ORDER — HEPARIN SOD (PORK) LOCK FLUSH 100 UNIT/ML IV SOLN
500.0000 [IU] | Freq: Once | INTRAVENOUS | Status: AC
  Administered 2021-02-15: 500 [IU] via INTRAVENOUS
  Filled 2021-02-15: qty 5

## 2021-02-15 MED ORDER — SODIUM CHLORIDE 0.9 % IV SOLN: 4.0000 mg | Freq: Once | INTRAVENOUS | Status: DC

## 2021-02-15 NOTE — Research (Signed)
Patient Debra Little returns to clinic as scheduled this morning for her medical oncology follow up, her Survivorship visit and her 56 week study assessment for the SWOG S1714 study. Debra Little week 59 - S1714 Neuropathy Assessment was held today and will be completed next Wednesday when she comes in for her treatment. She states she just doesn't feel well enough today to complete the study testing. The patient did complete her 52 week study questionnaires including EORTC, CIPN20, PROMIS-29, GSLTPAQ, Patient Reported Symptom Burden, and the new FACT/GOG-NTX. There were no protocol required research labs to be drawn for this patient.  Debra Little does report some neuropathy continuing,especially in her toes and feet. She states her toenails haven't been cut as she gets out of breath when she tries, patient encouraged to see a podiatrist to have nail care complete. Patient confirms she is able to manage all of her ADLs without any assistance. Solicited CTCAE Assessment as noted below with AE grade and attribution verified with Dr. Janese Banks. She sees OT for the Lymphedema in her right arm, and has a compression glove and sleeve for this. Patient is wearing her lymphedema sleeves today, states she wears the one for nighttime as it feels much better during the day for her. She is right hand dominant, and does not want to remove her sleeves to perform the neuropathy assessment today.  Patient denies using any cold or compression gloves or having had acupuncture or massage therapy to manage her neuropathy symptoms. The patient was reassured that her assessments can be performed next week, without any problems. Dr. Janese Banks was notified of the all the above. She reportedly has a history of Fibromyalgia and has been treated for this with gabapentin since 05/18/2011. Solicited AEs with grade and attribution as noted below:  Study/Protocol: SWOG S8110 Cycle: 52 week solicited AEs Event Grade Onset Date Resolved Date Drug Name  Attribution Treatment Comments  Dysesthesia 1 05/18/2011   unrelated gabapentin Reported at baseline  Neuralgia 0        Paresthesia 1 05/18/2011   unrelated gabapentin Reported at baseline  Peripheral motor neuropathy 0        Peripheral sensory neuropathy 1 2 1  06/15/2020 03/16/2020 05/18/2011  06/15/2020 03/16/2020  possibly gabapentin Reported at baseline, increased with paclitaxel, improved since off chemo  Localized edema BLE 2 03/16/2020 05/17/2020  probably Lasix  Resolved with Lasix   Jeral Fruit, RN 02/15/21 9:37 AM

## 2021-02-15 NOTE — Progress Notes (Signed)
Pt in for follow up, reports pain in right shoulder and arm, with lymphedema. Pt with shortness of breath on minimal exertion. Pt states having a cough at times.   130pm: pt came back to clinic after infusion and RN and pt walked three long laps around halls pt starting and ending oxygen saturation remained 96%.

## 2021-02-15 NOTE — Progress Notes (Signed)
Hematology/Oncology Consult note [facility]  Telephone:([phone] [phone]  Patient Care Team: Tonia Ghent, MD as PCP - General (Family Medicine) Bary Castilla, Forest Gleason, MD (General Surgery) Modesto Charon, MD (Family Medicine) Laneta Simmers as Physician Assistant (Urology) [name] Javier Docker, MD as Consulting Physician (Cardiology) Sindy Guadeloupe, MD as Consulting Physician (Hematology and Oncology) Noreene Filbert, MD as Referring Physician (Radiation Oncology)   Name of the patient: Debra Little  [id number]  05/26/1956   Date of visit: 02/15/21  Diagnosis- 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 in the past and has undergone breast biopsies which did not previously showed malignancy. More recently patient noted some red discoloration around her right perioral as well as a palpable mass which led to a diagnostic mammogram on the right side her prior mammogram in February 2020 was normal in August 2020 she was noted to have a 2.7 x 2 x 2.3 cm mass in her right breast along with 4 morphologically abnormal lymph nodes. She underwent breast biopsy as well as lymph node biopsy which showed grade 3 invasive mammary carcinoma. ER PR and HER-2/neunegative.Sections show high-grade invasive carcinoma with focal squamous differentiation and pinpoint keratin ideation. The features are concerning for metaplastic differentiation.[name] Lymphovascular invasion present. There was extranodal extension present on the lymph node biopsy.   Her family history is significant for breast cancer in her mother in her 35s. Father had colon cancer in his70s.Family history also significant for ovarian cancer in her maternal  aunt.  MRI of bilateral breasts showed: Primary right breast mass was 3.5 x 2.7 x 3.4 cm. There were surrounding numerous nodules consistent with satellite lesions. Extensive nodular non-mass enhancement throughout the right breast involving the upper and lower quadrants spanning 6.2 x 4.5 x 5.5 cm. Markedly enhancement of the left breast diffusely. Non-mass enhancement measures 1.9 x 2.4 cm. At least 3 abnormal lymph nodes in the right axilla. No abnormal left axillary lymph nodes. 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 Equivocal inferior breast and left axillary nodal hypermetabolism. No evidence of extrathoracic hypermetabolic metastases. Lateral right breast primary with right axillary nodal metastases  Patient had 3 cycles of neoadjuvant AC chemotherapy along with Keytruda. There was no significant response in her tumor and patient proceeded with bilateral mastectomy with targeted node dissection. 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 7 lymph nodes were positive for malignancy with extranodal extension overall cancer cellularity was 40% [id number].Patient completed 1 cycle of adjuvant AC chemotherapy and willcomplete weekly Taxol chemotherapy in April 2021. She completed post mastectomy radiation  PET scan in November 2021 was done for ongoing chest wall pain. It showed:1. Aggressive appearing and extensive recurrent right breast cancer with right chest wall involvement as detailed above. 2. No findings for mediastinal/hilar metastatic lymphadenopathy or metastatic pulmonary disease. 3. Single left hepatic lobe liver lesion consistent with metastatic focus. 4. Hypermetabolic soft tissue mass in the left oropharynx/tonsillar region suspicious for primary neoplasm.  NGS testing showed PIK3CA mutation and PD-L1 CPS  score of 95. No other actionable mutation was seen.   Interval history-patient continues to feel fatigued.  She reports exertional shortness of breath and has to  take multiple breaks in between tasks.  Continues to have pain and burning in her right upper extremity and she is using her lymphedema sleeve.  ECOG PS- 1 Pain scale- 4 Opioid associated constipation- no  Review of systems- Review of Systems  Constitutional: Positive for malaise/fatigue. Negative for chills, fever and weight loss.  HENT: Negative for congestion, ear discharge and nosebleeds.   Eyes: Negative for blurred vision.  Respiratory: Positive for shortness of breath. Negative for cough, hemoptysis, sputum production and wheezing.   Cardiovascular: Negative for chest pain, palpitations, orthopnea and claudication.  Gastrointestinal: Negative for abdominal pain, blood in stool, constipation, diarrhea, heartburn, melena, nausea and vomiting.  Genitourinary: Negative for dysuria, flank pain, frequency, hematuria and urgency.  Musculoskeletal: Negative for back pain, joint pain and myalgias.       Right arm pain and swelling  Skin: Negative for rash.  Neurological: Negative for dizziness, tingling, focal weakness, seizures, weakness and headaches.  Endo/Heme/Allergies: Does not bruise/bleed easily.  Psychiatric/Behavioral: Negative for depression and suicidal ideas. The patient does not have insomnia.       Allergies  Allergen Reactions  . Amitiza [Lubiprostone] Other (See Comments)    Overly effective at higher dose  . Bentyl [Dicyclomine Hcl]     Lack of effect  . Cortisone Other (See Comments)    flush  . Mobic [Meloxicam]     Palpitations.  But can tolerate aleve  . Nitrofurantoin Monohyd Macro   . Sulfonamide Derivatives Nausea Only  . Zoloft [Sertraline Hcl] Diarrhea and Nausea Only  . Buspar [Buspirone] Palpitations  . Celecoxib Rash    REACTION: unspecified  . Doxycycline Palpitations    chest pain   . Penicillins Rash     Past Medical History:  Diagnosis Date  . Anemia    d/t chemo - last Hgb 10.8  . Anxiety    sees Dr. [name]  . BRCA negative    Inv[facility]ae panel neg except CHEK2 VUS  . Breast cancer ([place]) 2020  . Cyst of breast    per [name]  . Dysrhythmia    Hx of palpitations  . Family history of breast cancer   . Family history of colon cancer   . Family history of leukemia   . Family history of ovarian cancer   . Fibromyalgia   . GERD (gastroesophageal reflux disease)   . Heart murmur   . Hemorrhoids   . Herpes, genital 04/2015   confirmed with HSV 2 IgG  . Hiatal hernia   . Hypercholesterolemia    Dr. Kyra Searles  . Hyperlipidemia   . IBS (irritable bowel syndrome)    constipation predominant  . IC (interstitial cystitis)    per [facility]  . Mild depression ([place])   . MVP (mitral valve prolapse)    [facility] cards eval 2012  . Osteopenia    2010/2017, DEXA at [facility];spine and fem neck  . PONV (postoperative nausea and vomiting)   . Vitamin D deficiency    history of     Past Surgical History:  Procedure Laterality Date  . ABDOMINAL HYSTERECTOMY    . APPENDECTOMY    . BLADDER SURGERY     1980's  . BREAST EXCISIONAL BIOPSY Right    benign  . BREAST EXCISIONAL BIOPSY Bilateral    benign  . COLONOSCOPY  09/2014   Dr. [name]  . COLONOSCOPY  2009   at [facility] 1 POLYP (BENIGN)  . excision of breast cysts     hx of multiple cyst  aspirations  . EXPLORATORY LAPAROTOMY    . MASTECTOMY MODIFIED RADICAL Bilateral 10/09/2019   Procedure: RIGHT MASTECTOMY MODIFIED RADICAL AND LEFT TOTAL MASTECTOMY;  Surgeon: Rolm Bookbinder, MD;  Location: [facility];  Service: General;  Laterality: Bilateral;  . PORTA CATH INSERTION N/A 08/06/2019   Procedure: PORTA CATH INSERTION;  Surgeon: Katha Cabal, MD;  Location: [place] [facility];  Service: Cardiovascular;  Laterality: N/A;  . PORTACATH PLACEMENT Left 08/05/2019   Procedure: INSERTION PORT-A-CATH, Attempted;   Surgeon: Jules Husbands, MD;  Location: [facility];  Service: General;  Laterality: Left;  . TUBAL LIGATION      Social History   Socioeconomic History  . Marital status: Married    Spouse name: Not on file  . Number of children: 1  . Years of education: Not on file  . Highest education level: Not on file  Occupational History  . Occupation: [employer]  Tobacco Use  . Smoking status: Never Smoker  . Smokeless tobacco: Never Used  Vaping Use  . Vaping Use: Never used  Substance and Sexual Activity  . Alcohol use: No    Alcohol/week: 0.0 standard drinks  . Drug use: No  . Sexual activity: Yes    Birth control/protection: Surgical    Comment: Hysterectomy  Other Topics Concern  . Not on file  Social History Narrative   Married in 02-Jan-1997, divorced as of 01/02/17, 1 son from previous relationship   Brother with MVA at 68, in rest home for many years as of 02-Jan-2018- died of covid recently   Social Determinants of Radio broadcast assistant Strain: Protection   . Difficulty of Paying Living Expenses: Not hard at all  Food Insecurity: No Food Insecurity  . Worried About Charity fundraiser in the Last Year: Never true  . Ran Out of Food in the Last Year: Never true  Transportation Needs: No Transportation Needs  . Lack of Transportation (Medical): No  . Lack of Transportation (Non-Medical): No  Physical Activity: Inactive  . Days of Exercise per Week: 0 days  . Minutes of Exercise per Session: 0 min  Stress: No Stress Concern Present  . Feeling of Stress : Not at all  Social Connections: Not on file  Intimate Partner Violence: Not At Risk  . Fear of Current or Ex-Partner: No  . Emotionally Abused: No  . Physically Abused: No  . Sexually Abused: No    Family History  Problem Relation Age of Onset  . Breast cancer Mother 38  . Hypertension Mother   . Colon cancer Father 103  . Hyperlipidemia Father   . Ovarian cancer Maternal Aunt 80  . Breast cancer Cousin   . Gallbladder disease  Paternal Grandmother   . Liver cancer Cousin 70       Malignant  . Cancer Maternal Uncle        unsure type     Current Outpatient Medications:  .  acetaminophen (TYLENOL) 650 MG CR tablet, Take 650 mg by mouth every 8 (eight) hours as needed for pain., Disp: , Rfl:  .  ascorbic acid (VITAMIN C) 500 MG tablet, Take 500 mg by mouth daily., Disp: , Rfl:  .  atorvastatin (LIPITOR) 40 MG tablet, Take 40 mg by mouth at bedtime. , Disp: , Rfl: 1 .  Calcium Carbonate-Vit D-Min (CALCIUM 1200 PO), Take 4 tablets by mouth daily., Disp: , Rfl:  .  cholecalciferol (VITAMIN D3) 25 MCG (1000 UNIT) tablet, Take 1 tablet (1,000  Units total) by mouth daily., Disp: , Rfl:  .  clonazePAM (KLONOPIN) 0.5 MG tablet, Take 0.5 mg by mouth 2 (two) times daily. , Disp: , Rfl:  .  dexamethasone (DECADRON) 4 MG tablet, Take 2 tablets (8 mg total) by mouth daily. Start the day after chemotherapy for 2 days. Take with food., Disp: 30 tablet, Rfl: 1 .  Dexlansoprazole 30 MG capsule, Take 1 capsule (30 mg total) by mouth daily., Disp: 30 capsule, Rfl: 6 .  DULoxetine (CYMBALTA) 60 MG capsule, Take 60 mg by mouth daily. , Disp: , Rfl:  .  gabapentin (NEURONTIN) 100 MG capsule, Take 100 mg by mouth at bedtime., Disp: , Rfl:  .  HYDROmorphone HCl (DILAUDID) 1 MG/ML LIQD, Take 0.5 mLs (0.5 mg total) by mouth every 4 (four) hours as needed for severe pain., Disp: 90 mL, Rfl: 0 .  lactulose (CHRONULAC) 10 GM/15ML solution, Take 45 mLs (30 g total) by mouth 2 (two) times daily., Disp: 236 mL, Rfl: 1 .  lidocaine-prilocaine (EMLA) cream, Apply to affected area once, Disp: 30 g, Rfl: 3 .  loratadine (CLARITIN) 10 MG tablet, Take 1 tablet (10 mg total) by mouth daily., Disp: , Rfl:  .  NON FORMULARY, Take 5 mLs by mouth 4 (four) times daily as needed. Swish and swallow 5-10 ml of Medicated Mouthwash 4 x per day as needed, Disp: , Rfl:  .  Plecanatide (TRULANCE) 3 MG TABS, Take 3 mg by mouth daily., Disp: 30 tablet, Rfl: 11 .   polyethylene glycol (MIRALAX / GLYCOLAX) 17 g packet, Take 17 g by mouth daily., Disp: , Rfl:  .  sennosides-docusate sodium (SENOKOT-S) 8.6-50 MG tablet, Take 1-2 tablets by mouth at bedtime., Disp: , Rfl:  .  vitamin B-12 (CYANOCOBALAMIN) 500 MCG tablet, Take 500 mcg by mouth daily., Disp: , Rfl:  .  fentaNYL (DURAGESIC) 12 MCG/HR, Place 1 patch onto the skin every 3 (three) days. (Patient not taking: Reported on 02/15/2021), Disp: 10 patch, Rfl: 0 .  furosemide (LASIX) 20 MG tablet, Take 1 tablet (20 mg total) by mouth every other day. (Patient not taking: Reported on 02/15/2021), Disp: , Rfl:  .  OLANZapine (ZYPREXA) 10 MG tablet, Take 1 tablet (10 mg total) by mouth at bedtime. For nausea or vomiting (Patient not taking: Reported on 02/15/2021), Disp: 30 tablet, Rfl: 0 .  ondansetron (ZOFRAN) 8 MG tablet, Take 1 tablet (8 mg total) by mouth 2 (two) times daily as needed for refractory nausea / vomiting. Start on day 3 after chemotherapy. (Patient not taking: Reported on 02/15/2021), Disp: 30 tablet, Rfl: 1 .  pregabalin (LYRICA) 75 MG capsule, Take 1 capsule (75 mg total) by mouth 2 (two) times daily. (Patient not taking: Reported on 02/15/2021), Disp: 60 capsule, Rfl: 2 .  prochlorperazine (COMPAZINE) 10 MG tablet, Take 1 tablet (10 mg total) by mouth every 6 (six) hours as needed (Nausea or vomiting). (Patient not taking: Reported on 02/15/2021), Disp: 30 tablet, Rfl: 1 .  sucralfate (CARAFATE) 1 g tablet, Take 1 g by mouth 3 (three) times daily. (Patient not taking: Reported on 02/15/2021), Disp: , Rfl:  .  triamcinolone (KENALOG) 0.1 %, Apply 1 application topically 2 (two) times daily as needed. For itching (Patient not taking: Reported on 02/15/2021), Disp: 80 g, Rfl: 0 .  valACYclovir (VALTREX) 500 MG tablet, Take 1 tablet (500 mg total) by mouth daily. (Patient not taking: Reported on 02/15/2021), Disp: 90 tablet, Rfl: 3 No current facility-administered medications for this  visit.  Facility-Administered Medications Ordered in Other Visits:  .  sodium chloride flush (NS) 0.9 % injection 10 mL, 10 mL, Intravenous, PRN, Sindy Guadeloupe, MD, 10 mL at 02/15/21 0931  Physical exam:  Vitals:   02/15/21 1014  BP: 132/65  Pulse: 97  Resp: 18  Temp: (!) 96.5 F (35.8 C)  SpO2: 100%  Weight: 168 lb 6.4 oz (76.4 kg)   Physical Exam Constitutional:      General: She is not in acute distress. Cardiovascular:     Rate and Rhythm: Normal rate and regular rhythm.     Heart sounds: Normal heart sounds.  Pulmonary:     Effort: Pulmonary effort is normal.     Breath sounds: Normal breath sounds.  Skin:    General: Skin is warm and dry.  Neurological:     Mental Status: She is alert and oriented to person, place, and time.      CMP Latest Ref Rng & Units 02/15/2021  Glucose 70 - 99 mg/dL 111(H)  BUN 8 - 23 mg/dL 15  Creatinine 0.44 - 1.00 mg/dL 0.74  Sodium 135 - 145 mmol/L 135  Potassium 3.5 - 5.1 mmol/L 4.1  Chloride 98 - 111 mmol/L 102  CO2 22 - 32 mmol/L 25  Calcium 8.9 - 10.3 mg/dL 8.7(L)  Total Protein 6.5 - 8.1 g/dL 7.0  Total Bilirubin 0.3 - 1.2 mg/dL 0.5  Alkaline Phos 38 - 126 U/L 108  AST 15 - 41 U/L 25  ALT 0 - 44 U/L 21   CBC Latest Ref Rng & Units 02/15/2021  WBC 4.0 - 10.5 K/uL 9.9  Hemoglobin 12.0 - 15.0 g/dL 9.5(L)  Hematocrit 36.0 - 46.0 % 29.7(L)  Platelets 150 - 400 K/uL 274    No images are attached to the encounter.  US Venous Img Upper Uni Right  Result Date: 02/04/2021 CLINICAL DATA:  Localized edema the right upper extremity with pain for greater than 1 year EXAM: RIGHT UPPER EXTREMITY VENOUS DOPPLER ULTRASOUND TECHNIQUE: Gray-scale sonography with graded compression, as well as color Doppler and duplex ultrasound were performed to evaluate the upper extremity deep venous system from the level of the subclavian vein and including the jugular, axillary, basilic, radial, ulnar and upper cephalic vein. Spectral Doppler was  utilized to evaluate flow at rest and with distal augmentation maneuvers. COMPARISON:  None. FINDINGS: Contralateral Subclavian Vein: Respiratory phasicity is normal and symmetric with the symptomatic side. No evidence of thrombus. Normal compressibility. Internal Jugular Vein: No evidence of thrombus. Normal compressibility, respiratory phasicity and response to augmentation. Subclavian Vein: No evidence of thrombus. Normal compressibility, respiratory phasicity and response to augmentation. Axillary Vein: No evidence of thrombus. Normal compressibility, respiratory phasicity and response to augmentation. Cephalic Vein: No evidence of thrombus. Normal compressibility, respiratory phasicity and response to augmentation. Basilic Vein: No evidence of thrombus. Normal compressibility, respiratory phasicity and response to augmentation. Brachial Veins: No evidence of thrombus. Normal compressibility, respiratory phasicity and response to augmentation. Radial Veins: No evidence of thrombus. Normal compressibility, respiratory phasicity and response to augmentation. Ulnar Veins: No evidence of thrombus. Normal compressibility, respiratory phasicity and response to augmentation. Other Findings:  None visualized. IMPRESSION: No evidence of DVT within the right upper extremity. Electronically Signed   By: Constance Holster M.D.   On: 02/04/2021 17:37     Assessment and plan- Patient is a 65 y.o. female with metastatic triple negative metaplastic breast cancer with lymph node bone and liver metastases.   She is here for  on treatment assessment prior to cycle 7-day 1 of carboplatin gemcitabine chemotherapy  Plans okay to proceed with cycle 7-day 1 of gemcitabine carboplatin chemotherapy today.  She will receive cycle 7-day 8 next week and I will see her back in 3 weeks for cycle 8-day 1.  She is scheduled for repeat CT chest abdomen pelvis with contrast tomorrow.  She may receive Neulasta next week based on her white  cell count  Exertional shortness of breath: Suspect this is secondary to fatigue and chemo-induced anemia.  Her lung sounds normal to auscultation.  Her oxygen saturation is normal at rest and on ambulation.  Suspicion for pulmonary involvement from breast cancer is low on the differential.  But we will evaluate this further with a CT scan tomorrow.  Cancer associated fatigue: I have encouraged the patient to try American ginseng 1 g twice daily  Normocytic anemia: Secondary to chemotherapy continue to monitor  Suspect right upper extremity swelling and prominent chest wall veins likely secondary to lymphedema   Visit Diagnosis 1. Encounter for antineoplastic chemotherapy   2. Antineoplastic chemotherapy induced anemia   3. Malignant neoplasm of central portion of left breast in female, estrogen receptor negative ([place])      Dr. Randa Evens, MD, MPH [facility] [phone] 02/15/2021 1:48 PM

## 2021-02-16 ENCOUNTER — Ambulatory Visit
Admission: RE | Admit: 2021-02-16 | Discharge: 2021-02-16 | Disposition: A | Discharge status: Home / Self Care | Payer: Medicare Other | Source: Ambulatory Visit | Attending: Nurse Practitioner | Admitting: Nurse Practitioner

## 2021-02-16 DIAGNOSIS — R590 Localized enlarged lymph nodes: Secondary | ICD-10-CM | POA: Insufficient documentation

## 2021-02-16 DIAGNOSIS — R918 Other nonspecific abnormal finding of lung field: Secondary | ICD-10-CM | POA: Diagnosis not present

## 2021-02-16 DIAGNOSIS — G893 Neoplasm related pain (acute) (chronic): Secondary | ICD-10-CM | POA: Insufficient documentation

## 2021-02-16 DIAGNOSIS — J984 Other disorders of lung: Secondary | ICD-10-CM | POA: Insufficient documentation

## 2021-02-16 DIAGNOSIS — C50919 Malignant neoplasm of unspecified site of unspecified female breast: Secondary | ICD-10-CM | POA: Insufficient documentation

## 2021-02-16 DIAGNOSIS — J9811 Atelectasis: Secondary | ICD-10-CM | POA: Diagnosis not present

## 2021-02-16 DIAGNOSIS — J90 Pleural effusion, not elsewhere classified: Secondary | ICD-10-CM | POA: Diagnosis not present

## 2021-02-16 DIAGNOSIS — N2889 Other specified disorders of kidney and ureter: Secondary | ICD-10-CM | POA: Diagnosis not present

## 2021-02-16 MED ORDER — IOHEXOL 300 MG/ML  SOLN
100.0000 mL | Freq: Once | INTRAMUSCULAR | Status: AC | PRN
  Administered 2021-02-16: 100 mL via INTRAVENOUS

## 2021-02-17 ENCOUNTER — Telehealth: Payer: Self-pay | Admitting: Oncology

## 2021-02-17 ENCOUNTER — Other Ambulatory Visit: Payer: Self-pay | Admitting: Gastroenterology

## 2021-02-17 NOTE — Telephone Encounter (Signed)
Thanks! I will call her back shortly. Dr. Janese Banks is pretty booked on Monday---so waiting for approval to offer her a virtual visit.

## 2021-02-17 NOTE — Telephone Encounter (Signed)
Pt is calling saying she wants a visit with you about her CT results before April.

## 2021-02-18 ENCOUNTER — Inpatient Hospital Stay (HOSPITAL_BASED_OUTPATIENT_CLINIC_OR_DEPARTMENT_OTHER): Payer: Medicare Other | Admitting: Oncology

## 2021-02-18 ENCOUNTER — Encounter: Payer: Self-pay | Admitting: Oncology

## 2021-02-18 VITALS — BP 108/63 | HR 97 | Temp 98.9°F | Resp 18 | Wt 168.9 lb

## 2021-02-18 DIAGNOSIS — Z7189 Other specified counseling: Secondary | ICD-10-CM

## 2021-02-18 DIAGNOSIS — Z5111 Encounter for antineoplastic chemotherapy: Secondary | ICD-10-CM | POA: Diagnosis not present

## 2021-02-18 DIAGNOSIS — Z171 Estrogen receptor negative status [ER-]: Secondary | ICD-10-CM | POA: Diagnosis not present

## 2021-02-18 DIAGNOSIS — C787 Secondary malignant neoplasm of liver and intrahepatic bile duct: Secondary | ICD-10-CM | POA: Diagnosis not present

## 2021-02-18 DIAGNOSIS — Z803 Family history of malignant neoplasm of breast: Secondary | ICD-10-CM | POA: Diagnosis not present

## 2021-02-18 DIAGNOSIS — R531 Weakness: Secondary | ICD-10-CM | POA: Diagnosis not present

## 2021-02-18 DIAGNOSIS — Z79899 Other long term (current) drug therapy: Secondary | ICD-10-CM | POA: Diagnosis not present

## 2021-02-18 DIAGNOSIS — C50112 Malignant neoplasm of central portion of left female breast: Secondary | ICD-10-CM | POA: Diagnosis not present

## 2021-02-18 DIAGNOSIS — C50511 Malignant neoplasm of lower-outer quadrant of right female breast: Secondary | ICD-10-CM | POA: Diagnosis not present

## 2021-02-18 DIAGNOSIS — Z5189 Encounter for other specified aftercare: Secondary | ICD-10-CM | POA: Diagnosis not present

## 2021-02-18 DIAGNOSIS — G893 Neoplasm related pain (acute) (chronic): Secondary | ICD-10-CM | POA: Diagnosis not present

## 2021-02-18 DIAGNOSIS — R5381 Other malaise: Secondary | ICD-10-CM | POA: Diagnosis not present

## 2021-02-18 DIAGNOSIS — M8580 Other specified disorders of bone density and structure, unspecified site: Secondary | ICD-10-CM | POA: Diagnosis not present

## 2021-02-18 DIAGNOSIS — R6883 Chills (without fever): Secondary | ICD-10-CM | POA: Diagnosis not present

## 2021-02-18 DIAGNOSIS — Z923 Personal history of irradiation: Secondary | ICD-10-CM | POA: Diagnosis not present

## 2021-02-18 DIAGNOSIS — R5383 Other fatigue: Secondary | ICD-10-CM | POA: Diagnosis not present

## 2021-02-18 DIAGNOSIS — D6481 Anemia due to antineoplastic chemotherapy: Secondary | ICD-10-CM | POA: Diagnosis not present

## 2021-02-18 DIAGNOSIS — G4700 Insomnia, unspecified: Secondary | ICD-10-CM | POA: Diagnosis not present

## 2021-02-18 DIAGNOSIS — Z9013 Acquired absence of bilateral breasts and nipples: Secondary | ICD-10-CM | POA: Diagnosis not present

## 2021-02-18 DIAGNOSIS — T451X5A Adverse effect of antineoplastic and immunosuppressive drugs, initial encounter: Secondary | ICD-10-CM | POA: Diagnosis not present

## 2021-02-18 MED ORDER — LOPERAMIDE HCL 2 MG PO TABS: ORAL_TABLET | ORAL | 1 refills | Status: AC

## 2021-02-18 MED ORDER — DEXAMETHASONE 4 MG PO TABS: ORAL_TABLET | ORAL | 1 refills | Status: AC

## 2021-02-18 NOTE — Progress Notes (Signed)
Hematology/Oncology Consult note [facility]  Telephone:([phone] [phone]  Patient Care Team: Tonia Ghent, MD as PCP - General (Family Medicine) Bary Castilla, Forest Gleason, MD (General Surgery) Modesto Charon, MD (Family Medicine) Laneta Simmers as Physician Assistant (Urology) [name] Javier Docker, MD as Consulting Physician (Cardiology) Sindy Guadeloupe, MD as Consulting Physician (Hematology and Oncology) Noreene Filbert, MD as Referring Physician (Radiation Oncology)   Name of the patient: Debra Little  [id number]  [birth year]   Date of visit: 02/18/21  Diagnosis- 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 in the past and has undergone breast biopsies which did not previously showed malignancy. More recently patient noted some red discoloration around her right perioral as well as a palpable mass which led to a diagnostic mammogram on the right side her prior mammogram in February 2020 was normal in August 2020 she was noted to have a 2.7 x 2 x 2.3 cm mass in her right breast along with 4 morphologically abnormal lymph nodes. She underwent breast biopsy as well as lymph node biopsy which showed grade 3 invasive mammary carcinoma. ER PR and HER-2/neunegative.Sections show high-grade invasive carcinoma with focal squamous differentiation and pinpoint keratin ideation. The features are concerning for metaplastic differentiation.[name] Lymphovascular invasion present. There was extranodal extension present on the lymph node biopsy.   Her family history is significant for breast cancer in her mother in her 52s. Father had colon cancer in his70s.Family history also significant for ovarian cancer in her maternal aunt.  MRI of bilateral breasts  showed: Primary right breast mass was 3.5 x 2.7 x 3.4 cm. There were surrounding numerous nodules consistent with satellite lesions. Extensive nodular non-mass enhancement throughout the right breast involving the upper and lower quadrants spanning 6.2 x 4.5 x 5.5 cm. Markedly enhancement of the left breast diffusely. Non-mass enhancement measures 1.9 x 2.4 cm. At least 3 abnormal lymph nodes in the right axilla. No abnormal left axillary lymph nodes. 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 Equivocal inferior breast and left axillary nodal hypermetabolism. No evidence of extrathoracic hypermetabolic metastases. Lateral right breast primary with right axillary nodal metastases  Patient had 3 cycles of neoadjuvant AC chemotherapy along with Keytruda. There was no significant response in her tumor and patient proceeded with bilateral mastectomy with targeted node dissection. 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 7 lymph nodes were positive for malignancy with extranodal extension overall cancer cellularity was 40% [id number].Patient completed 1 cycle of adjuvant AC chemotherapy and willcomplete weekly Taxol chemotherapy in April 2021. She completed post mastectomy radiation  PET scan in November 2021 was done for ongoing chest wall pain. It showed:1. Aggressive appearing and extensive recurrent right breast cancer with right chest wall involvement as detailed above. 2. No findings for mediastinal/hilar metastatic lymphadenopathy or metastatic pulmonary disease. 3. Single left hepatic lobe liver lesion consistent with metastatic focus. 4. Hypermetabolic soft tissue mass in the left oropharynx/tonsillar region suspicious for primary neoplasm.  NGS testing showed PIK3CA mutation and PD-L1 CPS score of 95. No other actionable  mutation was seen.   Patient started on carboplatin and gemcitabine and later Keytruda was added.  She had disease progression on that regimen plan  is to switch her to sacituzumab govitecan  Interval history-patient reports ongoing fatigue and difficulty carrying out IADLs.  She remains independent of her ADLs.  Reports dull aching chronic right upper extremity pain.  Also reports occasional pain in her right shoulder.  Reports exertional shortness of breath  ECOG PS- 1 Pain scale- 5 Opioid associated constipation- no  Review of systems- Review of Systems  Constitutional: Positive for malaise/fatigue. Negative for chills, fever and weight loss.  HENT: Negative for congestion, ear discharge and nosebleeds.   Eyes: Negative for blurred vision.  Respiratory: Positive for shortness of breath. Negative for cough, hemoptysis, sputum production and wheezing.   Cardiovascular: Negative for chest pain, palpitations, orthopnea and claudication.  Gastrointestinal: Negative for abdominal pain, blood in stool, constipation, diarrhea, heartburn, melena, nausea and vomiting.  Genitourinary: Negative for dysuria, flank pain, frequency, hematuria and urgency.  Musculoskeletal: Negative for back pain, joint pain and myalgias.  Skin: Negative for rash.  Neurological: Negative for dizziness, tingling, focal weakness, seizures, weakness and headaches.  Endo/Heme/Allergies: Does not bruise/bleed easily.  Psychiatric/Behavioral: Negative for depression and suicidal ideas. The patient does not have insomnia.       Allergies  Allergen Reactions  . Amitiza [Lubiprostone] Other (See Comments)    Overly effective at higher dose  . Bentyl [Dicyclomine Hcl]     Lack of effect  . Cortisone Other (See Comments)    flush  . Mobic [Meloxicam]     Palpitations.  But can tolerate aleve  . Nitrofurantoin Monohyd Macro   . Sulfonamide Derivatives Nausea Only  . Zoloft [Sertraline Hcl] Diarrhea and Nausea Only  .  Buspar [Buspirone] Palpitations  . Celecoxib Rash    REACTION: unspecified  . Doxycycline Palpitations    chest pain  . Penicillins Rash     Past Medical History:  Diagnosis Date  . Anemia    d/t chemo - last Hgb 10.8  . Anxiety    sees Dr. [name]  . BRCA negative    [facility]ae panel neg except CHEK2 VUS  . Breast cancer ([place]) 2020  . Cyst of breast    per [name]  . Dysrhythmia    Hx of palpitations  . Family history of breast cancer   . Family history of colon cancer   . Family history of leukemia   . Family history of ovarian cancer   . Fibromyalgia   . GERD (gastroesophageal reflux disease)   . Heart murmur   . Hemorrhoids   . Herpes, genital 04/2015   confirmed with HSV 2 IgG  . Hiatal hernia   . Hypercholesterolemia    Dr. Kyra Searles  . Hyperlipidemia   . IBS (irritable bowel syndrome)    constipation predominant  . IC (interstitial cystitis)    per [facility]  . Mild depression ([place])   . MVP (mitral valve prolapse)    [facility] cards eval 2012  . Osteopenia    2010/2017, DEXA at [facility];spine and fem neck  . PONV (postoperative nausea and vomiting)   . Vitamin D deficiency    history of     Past Surgical History:  Procedure Laterality Date  . ABDOMINAL HYSTERECTOMY    . APPENDECTOMY    . BLADDER SURGERY     1980's  . BREAST EXCISIONAL BIOPSY Right    benign  . BREAST EXCISIONAL BIOPSY Bilateral    benign  . COLONOSCOPY  09/2014   Dr. [name]  . COLONOSCOPY  2009   at [facility] 1 POLYP (BENIGN)  .  excision of breast cysts     hx of multiple cyst aspirations  . EXPLORATORY LAPAROTOMY    . MASTECTOMY MODIFIED RADICAL Bilateral 10/09/2019   Procedure: RIGHT MASTECTOMY MODIFIED RADICAL AND LEFT TOTAL MASTECTOMY;  Surgeon: Rolm Bookbinder, MD;  Location: [place];  Service: General;  Laterality: Bilateral;  . PORTA CATH INSERTION N/A 08/06/2019   Procedure: PORTA CATH INSERTION;  Surgeon: Katha Cabal, MD;  Location: [place] [facility];  Service:  Cardiovascular;  Laterality: N/A;  . PORTACATH PLACEMENT Left 08/05/2019   Procedure: INSERTION PORT-A-CATH, Attempted;  Surgeon: Jules Husbands, MD;  Location: [facility];  Service: General;  Laterality: Left;  . TUBAL LIGATION      Social History   Socioeconomic History  . Marital status: Married    Spouse name: Not on file  . Number of children: 1  . Years of education: Not on file  . Highest education level: Not on file  Occupational History  . Occupation: [employer]  Tobacco Use  . Smoking status: Never Smoker  . Smokeless tobacco: Never Used  Vaping Use  . Vaping Use: Never used  Substance and Sexual Activity  . Alcohol use: No    Alcohol/week: 0.0 standard drinks  . Drug use: No  . Sexual activity: Yes    Birth control/protection: Surgical    Comment: Hysterectomy  Other Topics Concern  . Not on file  Social History Narrative   Married in 1997/02/08, divorced as of 02/08/17, 1 son from previous relationship   Brother with MVA at 57, in rest home for many years as of 2018-02-08- died of covid recently   Social Determinants of Radio broadcast assistant Strain: [place]   . Difficulty of Paying Living Expenses: Not hard at all  Food Insecurity: No Food Insecurity  . Worried About Charity fundraiser in the Last Year: Never true  . Ran Out of Food in the Last Year: Never true  Transportation Needs: No Transportation Needs  . Lack of Transportation (Medical): No  . Lack of Transportation (Non-Medical): No  Physical Activity: Inactive  . Days of Exercise per Week: 0 days  . Minutes of Exercise per Session: 0 min  Stress: No Stress Concern Present  . Feeling of Stress : Not at all  Social Connections: Not on file  Intimate Partner Violence: Not At Risk  . Fear of Current or Ex-Partner: No  . Emotionally Abused: No  . Physically Abused: No  . Sexually Abused: No    Family History  Problem Relation Age of Onset  . Breast cancer Mother 1  . Hypertension Mother   . Colon cancer  Father 81  . Hyperlipidemia Father   . Ovarian cancer Maternal Aunt 80  . Breast cancer Cousin   . Gallbladder disease Paternal Grandmother   . Liver cancer Cousin 70       Malignant  . Cancer Maternal Uncle        unsure type     Current Outpatient Medications:  .  acetaminophen (TYLENOL) 650 MG CR tablet, Take 650 mg by mouth every 8 (eight) hours as needed for pain., Disp: , Rfl:  .  ascorbic acid (VITAMIN C) 500 MG tablet, Take 500 mg by mouth daily., Disp: , Rfl:  .  atorvastatin (LIPITOR) 40 MG tablet, Take 40 mg by mouth at bedtime. , Disp: , Rfl: 1 .  Calcium Carbonate-Vit D-Min (CALCIUM 1200 PO), Take 4 tablets by mouth daily., Disp: , Rfl:  .  cholecalciferol (VITAMIN D3) 25 MCG (1000 UNIT) tablet, Take 1 tablet (1,000 Units total) by mouth daily., Disp: , Rfl:  .  clonazePAM (KLONOPIN) 0.5 MG tablet, Take 0.5 mg by mouth 2 (two) times daily. , Disp: , Rfl:  .  dexamethasone (DECADRON) 4 MG tablet, Take 2 tablets (8 mg) daily for 3 days after chemotherapy. Take with food., Disp: 30 tablet, Rfl: 1 .  Dexlansoprazole 30 MG capsule, Take 1 capsule (30 mg total) by mouth daily., Disp: 30 capsule, Rfl: 6 .  DULoxetine (CYMBALTA) 60 MG capsule, Take 60 mg by mouth daily. , Disp: , Rfl:  .  fentaNYL (DURAGESIC) 12 MCG/HR, Place 1 patch onto the skin every 3 (three) days., Disp: 10 patch, Rfl: 0 .  gabapentin (NEURONTIN) 100 MG capsule, Take 100 mg by mouth at bedtime., Disp: , Rfl:  .  HYDROmorphone HCl (DILAUDID) 1 MG/ML LIQD, Take 0.5 mLs (0.5 mg total) by mouth every 4 (four) hours as needed for severe pain., Disp: 90 mL, Rfl: 0 .  lactulose (CHRONULAC) 10 GM/15ML solution, Take 45 mLs (30 g total) by mouth 2 (two) times daily., Disp: 236 mL, Rfl: 1 .  loperamide (IMODIUM A-D) 2 MG tablet, Take 2 at diarrhea onset, then 1 every 2hr until 12hrs with no BM. May take 2 every 4hrs at night. If diarrhea recurs repeat., Disp: 100 tablet, Rfl: 1 .  loratadine (CLARITIN) 10 MG tablet, Take 1  tablet (10 mg total) by mouth daily., Disp: , Rfl:  .  Plecanatide (TRULANCE) 3 MG TABS, Take 3 mg by mouth daily., Disp: 30 tablet, Rfl: 11 .  polyethylene glycol (MIRALAX / GLYCOLAX) 17 g packet, Take 17 g by mouth daily., Disp: , Rfl:  .  sennosides-docusate sodium (SENOKOT-S) 8.6-50 MG tablet, Take 1-2 tablets by mouth at bedtime., Disp: , Rfl:  .  vitamin B-12 (CYANOCOBALAMIN) 500 MCG tablet, Take 500 mcg by mouth daily., Disp: , Rfl:  .  furosemide (LASIX) 20 MG tablet, Take 1 tablet (20 mg total) by mouth every other day. (Patient not taking: No sig reported), Disp: , Rfl:  .  NON FORMULARY, Take 5 mLs by mouth 4 (four) times daily as needed. Swish and swallow 5-10 ml of Medicated Mouthwash 4 x per day as needed (Patient not taking: Reported on 02/18/2021), Disp: , Rfl:  .  OLANZapine (ZYPREXA) 10 MG tablet, Take 1 tablet (10 mg total) by mouth at bedtime. For nausea or vomiting (Patient not taking: No sig reported), Disp: 30 tablet, Rfl: 0 .  pregabalin (LYRICA) 75 MG capsule, Take 1 capsule (75 mg total) by mouth 2 (two) times daily. (Patient not taking: No sig reported), Disp: 60 capsule, Rfl: 2 .  sucralfate (CARAFATE) 1 g tablet, Take 1 g by mouth 3 (three) times daily. (Patient not taking: No sig reported), Disp: , Rfl:  .  triamcinolone (KENALOG) 0.1 %, Apply 1 application topically 2 (two) times daily as needed. For itching (Patient not taking: No sig reported), Disp: 80 g, Rfl: 0 .  valACYclovir (VALTREX) 500 MG tablet, Take 1 tablet (500 mg total) by mouth daily. (Patient not taking: No sig reported), Disp: 90 tablet, Rfl: 3  Physical exam:  Vitals:   02/18/21 0845  BP: 108/63  Pulse: 97  Resp: 18  Temp: 98.9 F (37.2 C)  TempSrc: Tympanic  SpO2: 98%  Weight: 168 lb 14.4 oz (76.6 kg)   Physical Exam Pulmonary:     Effort: Pulmonary effort is normal.  Skin:  General: Skin is warm and dry.  Neurological:     Mental Status: She is alert and oriented to person, place, and  time.      CMP Latest Ref Rng & Units 02/15/2021  Glucose 70 - 99 mg/dL 111(H)  BUN 8 - 23 mg/dL 15  Creatinine 0.44 - 1.00 mg/dL 0.74  Sodium 135 - 145 mmol/L 135  Potassium 3.5 - 5.1 mmol/L 4.1  Chloride 98 - 111 mmol/L 102  CO2 22 - 32 mmol/L 25  Calcium 8.9 - 10.3 mg/dL 8.7(L)  Total Protein 6.5 - 8.1 g/dL 7.0  Total Bilirubin 0.3 - 1.2 mg/dL 0.5  Alkaline Phos 38 - 126 U/L 108  AST 15 - 41 U/L 25  ALT 0 - 44 U/L 21   CBC Latest Ref Rng & Units 02/15/2021  WBC 4.0 - 10.5 K/uL 9.9  Hemoglobin 12.0 - 15.0 g/dL 9.5(L)  Hematocrit 36.0 - 46.0 % 29.7(L)  Platelets 150 - 400 K/uL 274           CT CHEST ABDOMEN PELVIS W CONTRAST  Result Date: 02/16/2021 CLINICAL DATA:  History of metastatic breast cancer. Increased swelling in the right upper chest area. EXAM: CT CHEST, ABDOMEN, AND PELVIS WITH CONTRAST TECHNIQUE: Multidetector CT imaging of the chest, abdomen and pelvis was performed following the standard protocol during bolus administration of intravenous contrast. CONTRAST:  173m OMNIPAQUE IOHEXOL 300 MG/ML  SOLN COMPARISON:  CT scan 07/07/2020 and PET-CT 12/09/2020 FINDINGS: CT CHEST FINDINGS Cardiovascular: Heart is normal in size. No pericardial effusion. The aorta is normal in caliber. No dissection. The branch vessels are patent. No definite coronary artery calcifications. Mediastinum/Nodes: New mediastinal and hilar lymphadenopathy. Some of the adenopathy appears slightly necrotic. 14 mm precarinal lymph node on image 21/2. This previously measured 6 mm. 10 mm right hilar node on image 23/2 11.5 mm right-sided subcarinal node on image number 29/2 Smaller bilateral hilar lymph nodes are also new and appears somewhat necrotic. There is moderate contrast in the esophagus which could suggest reflux. Lungs/Pleura: New bilateral pleural effusions with overlying atelectasis. 15 mm subpleural density in the right upper lobe anteriorly has vessels entering in it and could be an area  of rounded atelectasis. Other smaller pleural or subpleural lesions somewhat worrisome for pleural mets. A right-sided thoracentesis may be helpful. Do not see any definite metastatic pulmonary nodules. Patchy ground-glass opacity could suggest edema or inflammation. Stable apical scarring changes. Musculoskeletal: There is a rim enhancing low-attenuation lesion in the right axilla located between surgical clips. This was also noted on the prior PET-CT and was hypermetabolic. Findings consistent with necrotic tumor. This measures a maximum of 2.8 x 2.5 cm. It previously measured approximately 18 x 17 mm. Irregular enhancing lesion involves the right chest wall anteriorly near the sternum. This measures a maximum of 25 mm image 23/2. This previously measured 18.5 mm. Slightly more inferiorly and medially there is a rim enhancing necrotic appearing mass measuring 3.3 x 2.3 cm. This is causing erosion of the right aspect of the sternum and likely originated in the bone. It previously measured 2.7 x 2.0 cm. Stable elongated fluid collection involving the right chest wall likely a postoperative seroma. I do not see any lytic or sclerotic vertebral body lesions to suggest metastatic disease. The ribs appear intact. CT ABDOMEN PELVIS FINDINGS Hepatobiliary: No hepatic lesions to suggest metastatic disease. No intrahepatic biliary dilatation. The gallbladder is unremarkable. No common bile duct dilatation. Pancreas: No mass, inflammation or ductal dilatation. Spleen: Normal  size. Few small splenic lesions are stable and likely benign. Adrenals/Urinary Tract: Adrenal glands are normal. No renal lesions or hydronephrosis. Bladder is unremarkable. Stomach/Bowel: The stomach, duodenum, small bowel and colon are unremarkable. No acute inflammatory changes, mass lesions or obstructive findings. Vascular/Lymphatic: Stable scattered vascular calcifications. No aneurysm or dissection. The branch vessels are patent. Borderline  enlarged celiac axis and periportal lymph nodes are noted. No retroperitoneal lymphadenopathy. No pelvic lymphadenopathy. Reproductive: Surgically absent. Other: No pelvic mass or adenopathy. No free pelvic fluid collections. No inguinal mass or adenopathy. No abdominal wall hernia or subcutaneous lesions. Musculoskeletal: No lytic or sclerotic bone lesions involving the lumbar spine or pelvis are identified. IMPRESSION: 1. New and progressive mediastinal and hilar lymphadenopathy. Some of the adenopathy appears slightly necrotic. 2. Slightly larger rim enhancing necrotic mass in the right axilla. 3. Two enlarging adjacent necrotic appearing lesions involving the right chest wall. The larger lesion is causing erosion of the right aspect of the sternum (or originated in the sternum). 4. New bilateral pleural effusions with overlying atelectasis. 5. 15 mm subpleural density in the right upper lobe anteriorly has vessels entering in it and could be an area of rounded atelectasis. Other smaller pleural or subpleural lesions somewhat worrisome for pleural mets. A right-sided thoracentesis may be helpful. 6. Borderline enlarged celiac axis and periportal lymph nodes. No other findings for metastatic disease involving the abdomen/pelvis. 7. Stable elongated fluid collection involving the right chest wall, likely a postoperative seroma. Electronically Signed   By: Marijo Sanes M.D.   On: 02/16/2021 14:39   US Venous Img Upper Uni Right  Result Date: 02/04/2021 CLINICAL DATA:  Localized edema the right upper extremity with pain for greater than 1 year EXAM: RIGHT UPPER EXTREMITY VENOUS DOPPLER ULTRASOUND TECHNIQUE: Gray-scale sonography with graded compression, as well as color Doppler and duplex ultrasound were performed to evaluate the upper extremity deep venous system from the level of the subclavian vein and including the jugular, axillary, basilic, radial, ulnar and upper cephalic vein. Spectral Doppler was  utilized to evaluate flow at rest and with distal augmentation maneuvers. COMPARISON:  None. FINDINGS: Contralateral Subclavian Vein: Respiratory phasicity is normal and symmetric with the symptomatic side. No evidence of thrombus. Normal compressibility. Internal Jugular Vein: No evidence of thrombus. Normal compressibility, respiratory phasicity and response to augmentation. Subclavian Vein: No evidence of thrombus. Normal compressibility, respiratory phasicity and response to augmentation. Axillary Vein: No evidence of thrombus. Normal compressibility, respiratory phasicity and response to augmentation. Cephalic Vein: No evidence of thrombus. Normal compressibility, respiratory phasicity and response to augmentation. Basilic Vein: No evidence of thrombus. Normal compressibility, respiratory phasicity and response to augmentation. Brachial Veins: No evidence of thrombus. Normal compressibility, respiratory phasicity and response to augmentation. Radial Veins: No evidence of thrombus. Normal compressibility, respiratory phasicity and response to augmentation. Ulnar Veins: No evidence of thrombus. Normal compressibility, respiratory phasicity and response to augmentation. Other Findings:  None visualized. IMPRESSION: No evidence of DVT within the right upper extremity. Electronically Signed   By: Constance Holster M.D.   On: 02/04/2021 17:37     Assessment and plan- Patient is a 65 y.o. female with recurrent metastatic metaplastic triple negative breast cancer here to discuss CT scan results and further management  I have reviewed CT chest abdomen pelvis images independently.  I have shown the images to the patient and her husband as well as discussed the results in detail.  Unfortunately patient has had progression on carboplatin gemcitabine Keytruda regimen with progression of mediastinal  adenopathy and development of new bilateral pleural effusions as well as subpleural disease.Liver lesion not seen.   Chest wall lesion next to the sternum appears larger.  I will therefore plan to switch her to fourth line treatment with sacituzumab govitecan  Treatment will be given at 10 mg/kg 2 weeks on and 1 week off until progression or toxicity.  Discussed risks and benefits of this regimen including all but not limited to possible risk of infusion reaction, neutropenic sepsis, anemia thrombocytopenia nausea vomiting diarrhea.  Treatment will be given with a palliative intent.  Patient already received chemotherapy this week and will receive sacituzumab govitecan next week followed by a week off.  Following that she will receive this regimen 2 weeks on 1 week off.  Fatigue: Likely secondary to underlying malignancy as well as chemo-induced anemia.  She has purchased [place] ginseng which she will take 1 g twice daily  Chemo-induced anemia: Stable continue to monitor  Exertional shortness of breath: Likely multifactorial secondary to anemia and possibly pleural effusions as well.  She is not hypoxic.  We discussed considering right thoracentesis if need be to see if it would alleviate her symptoms but her fluid may reaccumulate.  Patient would like to hold off on thoracentesis at this time but will let us know if her dyspnea worsens.  Right upper extremity pain secondary to lymphedema: She wears a lymphedema sleeve.  I have also encouraged her to try the fentanyl patch which she has not started using yet.  Visit Diagnosis 1. Malignant neoplasm of lower-outer quadrant of right breast of female, estrogen receptor negative ([place])   2. Goals of care, counseling/discussion      Dr. Randa Evens, MD, MPH [facility] [phone] 02/18/2021 12:46 PM

## 2021-02-18 NOTE — Progress Notes (Signed)
DISCONTINUE OFF PATHWAY REGIMEN - Other   [id number]:Gemcitabine + Carboplatin (1,000/2) q21 Days:   A cycle is every 21 days:     Gemcitabine      Carboplatin   **Always confirm dose/schedule in your pharmacy ordering system**  REASON: Disease Progression PRIOR TREATMENT: Gemcitabine + Carboplatin (1,000/2) q21 Days TREATMENT RESPONSE: Progressive Disease (PD)  START OFF PATHWAY REGIMEN - Other   [id number]:Sacituzumab govitecan-hziy 10 mg/kg IV D1,8 q21 Days:   A cycle is every 21 days:     Sacituzumab govitecan-hziy   **Always confirm dose/schedule in your pharmacy ordering system**  Patient Characteristics: Intent of Therapy: Non-Curative / Palliative Intent, Discussed with Patient

## 2021-02-21 ENCOUNTER — Ambulatory Visit
Admission: RE | Admit: 2021-02-21 | Discharge: 2021-02-21 | Disposition: A | Discharge status: Home / Self Care | Payer: Medicare Other | Source: Ambulatory Visit | Attending: Radiology | Admitting: Radiology

## 2021-02-21 ENCOUNTER — Other Ambulatory Visit
Admission: RE | Admit: 2021-02-21 | Discharge: 2021-02-21 | Disposition: A | Discharge status: Home / Self Care | Payer: Medicare Other | Source: Ambulatory Visit | Attending: Nurse Practitioner | Admitting: Nurse Practitioner

## 2021-02-21 ENCOUNTER — Telehealth: Payer: Self-pay | Admitting: *Deleted

## 2021-02-21 ENCOUNTER — Other Ambulatory Visit: Payer: Self-pay

## 2021-02-21 ENCOUNTER — Other Ambulatory Visit: Payer: Self-pay | Admitting: Nurse Practitioner

## 2021-02-21 ENCOUNTER — Ambulatory Visit
Admission: RE | Admit: 2021-02-21 | Discharge: 2021-02-21 | Disposition: A | Discharge status: Home / Self Care | Payer: Medicare Other | Source: Ambulatory Visit | Attending: Nurse Practitioner | Admitting: Nurse Practitioner

## 2021-02-21 ENCOUNTER — Other Ambulatory Visit: Payer: Self-pay | Admitting: Radiology

## 2021-02-21 DIAGNOSIS — Z20822 Contact with and (suspected) exposure to covid-19: Secondary | ICD-10-CM | POA: Diagnosis not present

## 2021-02-21 DIAGNOSIS — Z9889 Other specified postprocedural states: Secondary | ICD-10-CM

## 2021-02-21 DIAGNOSIS — Z7689 Persons encountering health services in other specified circumstances: Secondary | ICD-10-CM | POA: Diagnosis not present

## 2021-02-21 DIAGNOSIS — J90 Pleural effusion, not elsewhere classified: Secondary | ICD-10-CM | POA: Diagnosis not present

## 2021-02-21 DIAGNOSIS — J910 Malignant pleural effusion: Secondary | ICD-10-CM | POA: Diagnosis not present

## 2021-02-21 DIAGNOSIS — C50919 Malignant neoplasm of unspecified site of unspecified female breast: Secondary | ICD-10-CM | POA: Diagnosis not present

## 2021-02-21 LAB — SARS CORONAVIRUS 2 BY RT PCR (HOSPITAL ORDER, PERFORMED IN ~~LOC~~ HOSPITAL LAB): SARS Coronavirus 2: NEGATIVE

## 2021-02-21 NOTE — Procedures (Signed)
Ultrasound-guided diagnostic and therapeutic right thoracentesis performed yielding 550 mililiters of straw colored fluid. No immediate complications.   Diagnostic fluid was sent to the lab for further analysis. Follow-up chest x-ray pending. EBL is < 2 ml.

## 2021-02-21 NOTE — Progress Notes (Signed)
Patient reporting SOB. Previous imaging showed pleural effusion. Thoracentesis ordered with cytology. Spoke to IR who can see patient this afternoon. Patient will go to preadmit testing for rapid covid testing now then [facility] for thor[facility] at 2:30. Moishe Spice to fax order to IR. Reviewed instructions with patient.

## 2021-02-21 NOTE — Telephone Encounter (Signed)
Thoracentesis and rapid covid testing arranged for today and patient aware.

## 2021-02-21 NOTE — Telephone Encounter (Signed)
Would you like for me to call patient and let her know it is scheduled for this afternoon?  Debra Little

## 2021-02-21 NOTE — Telephone Encounter (Signed)
Central scheduling called reporting that [place] requested an order for patient to have a Left Thoracentesis this week, They did right side today, but she has fluid on both sides

## 2021-02-21 NOTE — Telephone Encounter (Signed)
Patient called stating that she was told to call if she wants fluid drawn off her lung and she sates she is short of breath and would like to get this done

## 2021-02-21 NOTE — Telephone Encounter (Signed)
Sonia Baller- can you please arrange this for her?

## 2021-02-22 ENCOUNTER — Other Ambulatory Visit
Admission: RE | Admit: 2021-02-22 | Discharge: 2021-02-22 | Disposition: A | Discharge status: Home / Self Care | Payer: Medicare Other | Source: Ambulatory Visit | Attending: Nurse Practitioner | Admitting: Nurse Practitioner

## 2021-02-22 ENCOUNTER — Inpatient Hospital Stay: Payer: Medicare Other

## 2021-02-22 ENCOUNTER — Other Ambulatory Visit: Payer: Self-pay | Admitting: Nurse Practitioner

## 2021-02-22 DIAGNOSIS — Z20822 Contact with and (suspected) exposure to covid-19: Secondary | ICD-10-CM | POA: Diagnosis not present

## 2021-02-22 DIAGNOSIS — Z01812 Encounter for preprocedural laboratory examination: Secondary | ICD-10-CM | POA: Diagnosis not present

## 2021-02-22 DIAGNOSIS — J90 Pleural effusion, not elsewhere classified: Secondary | ICD-10-CM

## 2021-02-22 NOTE — Telephone Encounter (Signed)
I called this afternoon and verified that she needed a covid test and it will be done on Friday. I asked if she needs anything from me and the answer no.

## 2021-02-22 NOTE — Progress Notes (Signed)
Patient continues to have shortness of breath, somewhat improved post right thoracentesis. She would like to proceed with thoracentesis on left. Spoke to Nancee Liter who will schedule patient for thoracentesis tomorrow, 3/30 at 8:30 am. Patient to have stat covid test today. Orders placed and notified pre-admit testing.

## 2021-02-23 ENCOUNTER — Ambulatory Visit: Payer: Medicare Other

## 2021-02-23 ENCOUNTER — Ambulatory Visit
Admission: RE | Admit: 2021-02-23 | Discharge: 2021-02-23 | Disposition: A | Discharge status: Home / Self Care | Payer: Medicare Other | Source: Ambulatory Visit | Attending: Interventional Radiology | Admitting: Interventional Radiology

## 2021-02-23 ENCOUNTER — Other Ambulatory Visit: Payer: Self-pay | Admitting: Nurse Practitioner

## 2021-02-23 ENCOUNTER — Ambulatory Visit: Admission: RE | Admit: 2021-02-23 | Payer: Medicare Other | Source: Ambulatory Visit

## 2021-02-23 ENCOUNTER — Other Ambulatory Visit: Payer: Self-pay

## 2021-02-23 ENCOUNTER — Ambulatory Visit
Admission: RE | Admit: 2021-02-23 | Discharge: 2021-02-23 | Disposition: A | Discharge status: Home / Self Care | Payer: Medicare Other | Source: Ambulatory Visit | Attending: Nurse Practitioner | Admitting: Nurse Practitioner

## 2021-02-23 DIAGNOSIS — J90 Pleural effusion, not elsewhere classified: Secondary | ICD-10-CM

## 2021-02-23 DIAGNOSIS — J910 Malignant pleural effusion: Secondary | ICD-10-CM | POA: Diagnosis not present

## 2021-02-23 DIAGNOSIS — C801 Malignant (primary) neoplasm, unspecified: Secondary | ICD-10-CM | POA: Insufficient documentation

## 2021-02-23 DIAGNOSIS — J939 Pneumothorax, unspecified: Secondary | ICD-10-CM

## 2021-02-23 DIAGNOSIS — Z7689 Persons encountering health services in other specified circumstances: Secondary | ICD-10-CM | POA: Diagnosis not present

## 2021-02-23 LAB — SARS CORONAVIRUS 2 (TAT 6-24 HRS): SARS Coronavirus 2: NEGATIVE

## 2021-02-23 NOTE — Procedures (Signed)
Interventional Radiology Procedure Note  Procedure: Left thoracentesis  Indication: Left pleural effusion  Findings: Please refer to procedural dictation for full description.  Complications: Small left pneumothorax  EBL: < 10 mL  Miachel Roux, MD [phone]

## 2021-02-24 ENCOUNTER — Inpatient Hospital Stay: Payer: Medicare Other

## 2021-02-24 ENCOUNTER — Other Ambulatory Visit: Payer: Self-pay | Admitting: *Deleted

## 2021-02-24 ENCOUNTER — Other Ambulatory Visit: Payer: Self-pay

## 2021-02-24 ENCOUNTER — Telehealth: Payer: Self-pay | Admitting: *Deleted

## 2021-02-24 VITALS — BP 100/51 | HR 92 | Temp 96.3°F | Resp 18 | Ht 67.0 in | Wt 168.6 lb

## 2021-02-24 DIAGNOSIS — Z171 Estrogen receptor negative status [ER-]: Secondary | ICD-10-CM

## 2021-02-24 DIAGNOSIS — C50112 Malignant neoplasm of central portion of left female breast: Secondary | ICD-10-CM | POA: Diagnosis not present

## 2021-02-24 DIAGNOSIS — R5383 Other fatigue: Secondary | ICD-10-CM | POA: Diagnosis not present

## 2021-02-24 DIAGNOSIS — Z9013 Acquired absence of bilateral breasts and nipples: Secondary | ICD-10-CM | POA: Diagnosis not present

## 2021-02-24 DIAGNOSIS — M8580 Other specified disorders of bone density and structure, unspecified site: Secondary | ICD-10-CM | POA: Diagnosis not present

## 2021-02-24 DIAGNOSIS — G4700 Insomnia, unspecified: Secondary | ICD-10-CM | POA: Diagnosis not present

## 2021-02-24 DIAGNOSIS — C50511 Malignant neoplasm of lower-outer quadrant of right female breast: Secondary | ICD-10-CM

## 2021-02-24 DIAGNOSIS — T451X5A Adverse effect of antineoplastic and immunosuppressive drugs, initial encounter: Secondary | ICD-10-CM | POA: Diagnosis not present

## 2021-02-24 DIAGNOSIS — Z95828 Presence of other vascular implants and grafts: Secondary | ICD-10-CM

## 2021-02-24 DIAGNOSIS — Z5111 Encounter for antineoplastic chemotherapy: Secondary | ICD-10-CM | POA: Diagnosis not present

## 2021-02-24 DIAGNOSIS — R6883 Chills (without fever): Secondary | ICD-10-CM | POA: Diagnosis not present

## 2021-02-24 DIAGNOSIS — D6481 Anemia due to antineoplastic chemotherapy: Secondary | ICD-10-CM | POA: Diagnosis not present

## 2021-02-24 DIAGNOSIS — G893 Neoplasm related pain (acute) (chronic): Secondary | ICD-10-CM | POA: Diagnosis not present

## 2021-02-24 DIAGNOSIS — Z5189 Encounter for other specified aftercare: Secondary | ICD-10-CM | POA: Diagnosis not present

## 2021-02-24 DIAGNOSIS — Z803 Family history of malignant neoplasm of breast: Secondary | ICD-10-CM | POA: Diagnosis not present

## 2021-02-24 DIAGNOSIS — Z79899 Other long term (current) drug therapy: Secondary | ICD-10-CM | POA: Diagnosis not present

## 2021-02-24 DIAGNOSIS — C787 Secondary malignant neoplasm of liver and intrahepatic bile duct: Secondary | ICD-10-CM | POA: Diagnosis not present

## 2021-02-24 DIAGNOSIS — R5381 Other malaise: Secondary | ICD-10-CM | POA: Diagnosis not present

## 2021-02-24 DIAGNOSIS — Z923 Personal history of irradiation: Secondary | ICD-10-CM | POA: Diagnosis not present

## 2021-02-24 DIAGNOSIS — R531 Weakness: Secondary | ICD-10-CM | POA: Diagnosis not present

## 2021-02-24 LAB — COMPREHENSIVE METABOLIC PANEL
ALT: 29 U/L (ref 0–44)
AST: 31 U/L (ref 15–41)
Albumin: 3 g/dL — ABNORMAL LOW (ref 3.5–5.0)
Alkaline Phosphatase: 82 U/L (ref 38–126)
Anion gap: 10 (ref 5–15)
BUN: 15 mg/dL (ref 8–23)
CO2: 25 mmol/L (ref 22–32)
Calcium: 8.8 mg/dL — ABNORMAL LOW (ref 8.9–10.3)
Chloride: 102 mmol/L (ref 98–111)
Creatinine, Ser: 0.83 mg/dL (ref 0.44–1.00)
GFR, Estimated: >60 mL/min (ref >60)
Glucose, Bld: 130 mg/dL — ABNORMAL HIGH (ref 70–99)
Potassium: 4.1 mmol/L (ref 3.5–5.1)
Sodium: 137 mmol/L (ref 135–145)
Total Bilirubin: 0.5 mg/dL (ref 0.3–1.2)
Total Protein: 6.6 g/dL (ref 6.5–8.1)

## 2021-02-24 LAB — CBC WITH DIFFERENTIAL/PLATELET
Abs Immature Granulocytes: 0.04 10*3/uL (ref 0.00–0.07)
Basophils Absolute: 0 10*3/uL (ref 0.0–0.1)
Basophils Relative: 0 %
Eosinophils Absolute: 0 10*3/uL (ref 0.0–0.5)
Eosinophils Relative: 1 %
HCT: 28.1 % — ABNORMAL LOW (ref 36.0–46.0)
Hemoglobin: 8.9 g/dL — ABNORMAL LOW (ref 12.0–15.0)
Immature Granulocytes: 1 %
Lymphocytes Relative: 6 %
Lymphs Abs: 0.4 10*3/uL — ABNORMAL LOW (ref 0.7–4.0)
MCH: 28.3 pg (ref 26.0–34.0)
MCHC: 31.7 g/dL (ref 30.0–36.0)
MCV: 89.5 fL (ref 80.0–100.0)
Monocytes Absolute: 1.8 10*3/uL — ABNORMAL HIGH (ref 0.1–1.0)
Monocytes Relative: 26 %
Neutro Abs: 4.6 10*3/uL (ref 1.7–7.7)
Neutrophils Relative %: 66 %
Platelets: 162 10*3/uL (ref 150–400)
RBC: 3.14 MIL/uL — ABNORMAL LOW (ref 3.87–5.11)
RDW: 16.6 % — ABNORMAL HIGH (ref 11.5–15.5)
WBC: 6.9 10*3/uL (ref 4.0–10.5)
nRBC: 0 % (ref 0.0–0.2)

## 2021-02-24 LAB — MAGNESIUM: Magnesium: 1.6 mg/dL — ABNORMAL LOW (ref 1.7–2.4)

## 2021-02-24 LAB — PHOSPHORUS: Phosphorus: 3.7 mg/dL (ref 2.5–4.6)

## 2021-02-24 MED ORDER — ATROPINE SULFATE 1 MG/ML IJ SOLN: 0.5000 mg | Freq: Once | INTRAMUSCULAR | Status: DC | PRN

## 2021-02-24 MED ORDER — HEPARIN SOD (PORK) LOCK FLUSH 100 UNIT/ML IV SOLN
500.0000 [IU] | Freq: Once | INTRAVENOUS | Status: AC
  Administered 2021-02-24: 500 [IU] via INTRAVENOUS
  Filled 2021-02-24: qty 5

## 2021-02-24 MED ORDER — DIPHENHYDRAMINE HCL 50 MG/ML IJ SOLN: 50.0000 mg | Freq: Once | INTRAMUSCULAR | Status: DC

## 2021-02-24 MED ORDER — MAGNESIUM OXIDE 400 (241.3 MG) MG PO TABS: 400.0000 mg | ORAL_TABLET | Freq: Two times a day (BID) | ORAL | 0 refills | Status: DC

## 2021-02-24 MED ORDER — SODIUM CHLORIDE 0.9 % IV SOLN
Freq: Once | INTRAVENOUS | Status: DC
  Filled 2021-02-24: qty 250

## 2021-02-24 MED ORDER — FAMOTIDINE 20 MG IN NS 100 ML IVPB
20.0000 mg | Freq: Once | INTRAVENOUS | Status: DC
  Filled 2021-02-24: qty 100

## 2021-02-24 MED ORDER — SODIUM CHLORIDE 0.9 % IV SOLN: 10.0000 mg/kg | Freq: Once | INTRAVENOUS | Status: DC

## 2021-02-24 MED ORDER — PALONOSETRON HCL INJECTION 0.25 MG/5ML: 0.2500 mg | Freq: Once | INTRAVENOUS | Status: DC

## 2021-02-24 MED ORDER — ACETAMINOPHEN 325 MG PO TABS: 650.0000 mg | ORAL_TABLET | Freq: Once | ORAL | Status: DC

## 2021-02-24 MED ORDER — SODIUM CHLORIDE 0.9 % IV SOLN: 10.0000 mg | Freq: Once | INTRAVENOUS | Status: DC

## 2021-02-24 MED ORDER — SODIUM CHLORIDE 0.9 % IV SOLN: 150.0000 mg | Freq: Once | INTRAVENOUS | Status: DC

## 2021-02-24 MED ORDER — SODIUM CHLORIDE 0.9% FLUSH
10.0000 mL | Freq: Once | INTRAVENOUS | Status: AC
  Administered 2021-02-24: 10 mL via INTRAVENOUS
  Filled 2021-02-24: qty 10

## 2021-02-24 NOTE — Research (Signed)
[id number] 52 Month Assessment / Visit:   Research nurse met with patient in the infusion room prior to her treatment this morning. She states she is okay today to complete her peripheral neuropathy assessments for the study. She is feeling better. She is receiving a new chemotherapy treatment, and admits anxiety regarding this. She didn't wear her compression sleeve, states she wore it all night. Her right arm is slightly swollen today. The patient confirms she is right hand dominant. Mauricio Po, CRS was present to assist with the timing of the tuning fork and documentation of all research assessments in real time. The monofilament tip assessment was completed with the patient able to feel all sites without difficulty. The neuro-tip assessment was then completed without difficulty, there were instances that the patient was not able to feel a sharp sensation but could feel the pressure of the neuro-tip. The tuning fork assessments were completed to her right lower extremity and her right upper extremity at the prescribed locations to test per the protocol. The patient confirmed that she was able to feel the tuning fork in her right upper extremity without difficulty from her lymphedema. The patient denies any falls in the last six months, and was able to complete the Timed Get Up and Go assessment without any difficulty today. All times for the assessments were documented on the appropriate protocol CRF. The patient is aware that we will see her again at her one year follow up. The patient was encouraged to call the research department if she has any questions or concerns prior to that visit. Approximately 30 minutes were spent with the patient to complete all of her research activities today.   Jeral Fruit, RN 02/24/21 9:46 AM

## 2021-02-24 NOTE — Telephone Encounter (Signed)
-----   Message from Sindy Guadeloupe, MD sent at 02/24/2021  9:06 AM EDT ----- Send prescription for mag oxide 400 mg BID for 1 week

## 2021-02-24 NOTE — Progress Notes (Signed)
Patient here for new chemo treatment.  RN was about to begin premeds when patient asked "what if I have changed my mind."  Dr Janese Banks notified about patients concerns, came over to chairside to discuss treatment options.  Patient decided not to proceed with treatment today.

## 2021-02-24 NOTE — Telephone Encounter (Signed)
Called office at [facility] [phone]. Was told that md would like pt to have second opinion and would prefer Dr. Janese Banks. I was told that sammons schedule is about 7 to 10 days. I faxed the referral to [phone].

## 2021-02-24 NOTE — Telephone Encounter (Signed)
02/24/21-spoke with patient and let her know that Dr. Janese Banks wanted her to take mag ox twice a day for 1 week. SJC

## 2021-02-25 LAB — CYTOLOGY - NON PAP

## 2021-02-28 ENCOUNTER — Other Ambulatory Visit: Payer: Self-pay | Admitting: *Deleted

## 2021-02-28 ENCOUNTER — Ambulatory Visit: Payer: Medicare Other | Admitting: Oncology

## 2021-02-28 ENCOUNTER — Other Ambulatory Visit: Payer: Self-pay

## 2021-02-28 ENCOUNTER — Telehealth: Payer: Self-pay | Admitting: *Deleted

## 2021-02-28 ENCOUNTER — Other Ambulatory Visit
Admission: RE | Admit: 2021-02-28 | Discharge: 2021-02-28 | Disposition: A | Discharge status: Home / Self Care | Payer: Medicare Other | Source: Ambulatory Visit | Attending: Oncology | Admitting: Oncology

## 2021-02-28 ENCOUNTER — Inpatient Hospital Stay: Payer: Medicare Other | Attending: Oncology | Admitting: Oncology

## 2021-02-28 ENCOUNTER — Ambulatory Visit
Admission: RE | Admit: 2021-02-28 | Discharge: 2021-02-28 | Disposition: A | Discharge status: Home / Self Care | Payer: Medicare Other | Source: Ambulatory Visit | Attending: Oncology | Admitting: Oncology

## 2021-02-28 ENCOUNTER — Ambulatory Visit
Admission: RE | Admit: 2021-02-28 | Discharge: 2021-02-28 | Disposition: A | Discharge status: Home / Self Care | Payer: Medicare Other | Source: Ambulatory Visit | Attending: Radiology | Admitting: Radiology

## 2021-02-28 VITALS — BP 115/59 | HR 83 | Temp 98.9°F | Resp 16

## 2021-02-28 DIAGNOSIS — Z9221 Personal history of antineoplastic chemotherapy: Secondary | ICD-10-CM | POA: Insufficient documentation

## 2021-02-28 DIAGNOSIS — T451X5A Adverse effect of antineoplastic and immunosuppressive drugs, initial encounter: Secondary | ICD-10-CM | POA: Insufficient documentation

## 2021-02-28 DIAGNOSIS — Z20822 Contact with and (suspected) exposure to covid-19: Secondary | ICD-10-CM | POA: Diagnosis not present

## 2021-02-28 DIAGNOSIS — R221 Localized swelling, mass and lump, neck: Secondary | ICD-10-CM | POA: Diagnosis not present

## 2021-02-28 DIAGNOSIS — Z171 Estrogen receptor negative status [ER-]: Secondary | ICD-10-CM

## 2021-02-28 DIAGNOSIS — C50911 Malignant neoplasm of unspecified site of right female breast: Secondary | ICD-10-CM | POA: Diagnosis not present

## 2021-02-28 DIAGNOSIS — G893 Neoplasm related pain (acute) (chronic): Secondary | ICD-10-CM | POA: Diagnosis not present

## 2021-02-28 DIAGNOSIS — C787 Secondary malignant neoplasm of liver and intrahepatic bile duct: Secondary | ICD-10-CM | POA: Insufficient documentation

## 2021-02-28 DIAGNOSIS — J90 Pleural effusion, not elsewhere classified: Secondary | ICD-10-CM | POA: Diagnosis not present

## 2021-02-28 DIAGNOSIS — Z9013 Acquired absence of bilateral breasts and nipples: Secondary | ICD-10-CM | POA: Diagnosis not present

## 2021-02-28 DIAGNOSIS — C50511 Malignant neoplasm of lower-outer quadrant of right female breast: Secondary | ICD-10-CM

## 2021-02-28 DIAGNOSIS — L24A9 Irritant contact dermatitis due friction or contact with other specified body fluids: Secondary | ICD-10-CM

## 2021-02-28 DIAGNOSIS — L539 Erythematous condition, unspecified: Secondary | ICD-10-CM | POA: Diagnosis not present

## 2021-02-28 DIAGNOSIS — D6481 Anemia due to antineoplastic chemotherapy: Secondary | ICD-10-CM | POA: Insufficient documentation

## 2021-02-28 DIAGNOSIS — Z803 Family history of malignant neoplasm of breast: Secondary | ICD-10-CM | POA: Diagnosis not present

## 2021-02-28 DIAGNOSIS — Z800 Family history of malignant neoplasm of digestive organs: Secondary | ICD-10-CM | POA: Diagnosis not present

## 2021-02-28 DIAGNOSIS — R5383 Other fatigue: Secondary | ICD-10-CM | POA: Insufficient documentation

## 2021-02-28 DIAGNOSIS — Z79899 Other long term (current) drug therapy: Secondary | ICD-10-CM | POA: Diagnosis not present

## 2021-02-28 DIAGNOSIS — R5381 Other malaise: Secondary | ICD-10-CM | POA: Diagnosis not present

## 2021-02-28 DIAGNOSIS — F419 Anxiety disorder, unspecified: Secondary | ICD-10-CM | POA: Diagnosis not present

## 2021-02-28 DIAGNOSIS — C773 Secondary and unspecified malignant neoplasm of axilla and upper limb lymph nodes: Secondary | ICD-10-CM | POA: Diagnosis not present

## 2021-02-28 DIAGNOSIS — Z9889 Other specified postprocedural states: Secondary | ICD-10-CM

## 2021-02-28 LAB — SARS CORONAVIRUS 2 BY RT PCR (HOSPITAL ORDER, PERFORMED IN ~~LOC~~ HOSPITAL LAB): SARS Coronavirus 2: NEGATIVE

## 2021-02-28 MED ORDER — SULFAMETHOXAZOLE-TRIMETHOPRIM 800-160 MG PO TABS: 1.0000 | ORAL_TABLET | Freq: Two times a day (BID) | ORAL | 0 refills | Status: DC

## 2021-02-28 NOTE — Telephone Encounter (Signed)
Patient called requesting to be set up for a right sided Thoracentesis stating that she is short of breath.

## 2021-02-28 NOTE — Telephone Encounter (Signed)
Called pt and wanted to see when she wants thoracentesis, patient would like her thoracentesis today because she is going tomorrow to see Dr. Dani Gobble at [facility] for second opinion.  I did asked the patient if she was ready to get a Pleurx catheter so she does not have to come over here every single time she needs a thoracentesis.  She can do it from home and the home health person would start her off with and then she had learned to do it herself.  Patient was told about this but she says it is hard for you because you cannot do your best very well and you have to do up keep with that and at this time she is not interested.  Appointment made today for Covid test she is to come over here immediately and then this afternoon [facility] the [facility] mall for thoracentesis at 2 PM.  Patient agreeable to plan

## 2021-02-28 NOTE — Progress Notes (Signed)
Pt is add on due to a open area started draining under right axilla that is same side of breast cancer. We have put gauze with tape but hard area to keep bandage. , put paper tape on but every time she moves the tape will come off. Offered coban or burn net but pt did not want it. She will hold her arm tight to keep dressing there when needed. We are scheduling her u/s but need to check insurance. Will let pt know tom. Pt is aware

## 2021-02-28 NOTE — Telephone Encounter (Signed)
Patient coming in to see Dr Janese Banks

## 2021-02-28 NOTE — Telephone Encounter (Signed)
Called stating "something has broke loose under my arm and is leaking, has some blood in it and I need to be seen

## 2021-02-28 NOTE — Progress Notes (Signed)
Ardelle Park did this for the pt

## 2021-02-28 NOTE — Procedures (Signed)
Ultrasound-guided therapeutic right sided thoracentesis performed yielding 650 mililiters of straw colored fluid. No immediate complications. Follow-up chest x-ray pending. EBL is < 2ml.      

## 2021-02-28 NOTE — Telephone Encounter (Signed)
Debra Little is working on getting thoracentesis done

## 2021-03-01 ENCOUNTER — Telehealth: Payer: Self-pay | Admitting: Oncology

## 2021-03-01 DIAGNOSIS — C50911 Malignant neoplasm of unspecified site of right female breast: Secondary | ICD-10-CM | POA: Diagnosis not present

## 2021-03-01 DIAGNOSIS — C50819 Malignant neoplasm of overlapping sites of unspecified female breast: Secondary | ICD-10-CM | POA: Diagnosis not present

## 2021-03-01 DIAGNOSIS — Z171 Estrogen receptor negative status [ER-]: Secondary | ICD-10-CM | POA: Diagnosis not present

## 2021-03-01 DIAGNOSIS — C799 Secondary malignant neoplasm of unspecified site: Secondary | ICD-10-CM | POA: Diagnosis not present

## 2021-03-01 NOTE — Telephone Encounter (Signed)
Called patient to notify her of ultrasound scheduled tomorrow. Patient stated the area is still draining & so painful that she doesn't want to have it done. She's hoping antibiotics will "clear it up". Confirmed future appts with patient. MD aware.

## 2021-03-02 ENCOUNTER — Inpatient Hospital Stay: Payer: Medicare Other | Admitting: Hospice and Palliative Medicine

## 2021-03-02 ENCOUNTER — Inpatient Hospital Stay: Payer: Medicare Other

## 2021-03-02 ENCOUNTER — Other Ambulatory Visit: Payer: Medicare Other

## 2021-03-06 NOTE — Progress Notes (Signed)
Hematology/Oncology Consult note [facility]  Telephone:([phone] [phone]  Patient Care Team: Tonia Ghent, MD as PCP - General (Family Medicine) Bary Castilla, Forest Gleason, MD (General Surgery) Modesto Charon, MD (Family Medicine) Laneta Simmers as Physician Assistant (Urology) [name] Javier Docker, MD as Consulting Physician (Cardiology) Sindy Guadeloupe, MD as Consulting Physician (Hematology and Oncology) Noreene Filbert, MD as Referring Physician (Radiation Oncology)   Name of the patient: Debra Little  [id number]  February 05, 1956   Date of visit: 03/06/21  Diagnosis- 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 in the past and has undergone breast biopsies which did not previously showed malignancy. More recently patient noted some red discoloration around her right perioral as well as a palpable mass which led to a diagnostic mammogram on the right side her prior mammogram in February 2020 was normal in August 2020 she was noted to have a 2.7 x 2 x 2.3 cm mass in her right breast along with 4 morphologically abnormal lymph nodes. She underwent breast biopsy as well as lymph node biopsy which showed grade 3 invasive mammary carcinoma. ER PR and HER-2/neunegative.Sections show high-grade invasive carcinoma with focal squamous differentiation and pinpoint keratin ideation. The features are concerning for metaplastic differentiation.[name] Lymphovascular invasion present. There was extranodal extension present on the lymph node biopsy.   Her family history is significant for breast cancer in her mother in her 62s. Father had colon cancer in his70s.Family history also significant for ovarian cancer in her maternal aunt.   Interval history- Patient  reports right axillary swelling started to drain with bloody discharge. Has ongoing fatigue, chest wall pain  ECOG PS- 1 Pain scale- 4   Review of systems- Review of Systems  Constitutional: Positive for malaise/fatigue. Negative for chills, fever and weight loss.  HENT: Negative for congestion, ear discharge and nosebleeds.   Eyes: Negative for blurred vision.  Respiratory: Negative for cough, hemoptysis, sputum production, shortness of breath and wheezing.   Cardiovascular: Negative for chest pain, palpitations, orthopnea and claudication.  Gastrointestinal: Negative for abdominal pain, blood in stool, constipation, diarrhea, heartburn, melena, nausea and vomiting.  Genitourinary: Negative for dysuria, flank pain, frequency, hematuria and urgency.  Musculoskeletal: Negative for back pain, joint pain and myalgias.  Skin: Negative for rash.  Neurological: Negative for dizziness, tingling, focal weakness, seizures, weakness and headaches.  Endo/Heme/Allergies: Does not bruise/bleed easily.  Psychiatric/Behavioral: Negative for depression and suicidal ideas. The patient does not have insomnia.     Chest wall: draiing wound in right axilla.   Allergies  Allergen Reactions  . Amitiza [Lubiprostone] Other (See Comments)    Overly effective at higher dose  . Bentyl [Dicyclomine Hcl]     Lack of effect  . Cortisone Other (See Comments)    flush  . Mobic [Meloxicam]     Palpitations.  But can tolerate aleve  . Nitrofurantoin Monohyd Macro   . Sulfonamide Derivatives Nausea Only  . Zoloft [Sertraline Hcl] Diarrhea and Nausea Only  . Buspar [Buspirone] Palpitations  . Celecoxib Rash    REACTION: unspecified  . Doxycycline Palpitations    chest pain  . Penicillins Rash     Past Medical History:  Diagnosis Date  . Anemia    d/t chemo - last Hgb 10.8  . Anxiety    sees Dr. [name]  .  BRCA negative    [facility]ae panel neg except CHEK2 VUS  . Breast cancer ([place]) 2019-02-22  . Cyst of  breast    per [name]  . Dysrhythmia    Hx of palpitations  . Family history of breast cancer   . Family history of colon cancer   . Family history of leukemia   . Family history of ovarian cancer   . Fibromyalgia   . GERD (gastroesophageal reflux disease)   . Heart murmur   . Hemorrhoids   . Herpes, genital 04/2015   confirmed with HSV 2 IgG  . Hiatal hernia   . Hypercholesterolemia    Dr. Kyra Searles  . Hyperlipidemia   . IBS (irritable bowel syndrome)    constipation predominant  . IC (interstitial cystitis)    per [place] [facility]  . Mild depression ([place])   . MVP (mitral valve prolapse)    [facility] cards eval 02-22-11  . Osteopenia    2010/2017, DEXA at [facility];spine and fem neck  . PONV (postoperative nausea and vomiting)   . Vitamin D deficiency    history of     Past Surgical History:  Procedure Laterality Date  . ABDOMINAL HYSTERECTOMY    . APPENDECTOMY    . BLADDER SURGERY     1980's  . BREAST EXCISIONAL BIOPSY Right    benign  . BREAST EXCISIONAL BIOPSY Bilateral    benign  . COLONOSCOPY  09/2014   Dr. [name]  . COLONOSCOPY  February 22, 2008   at [facility] 1 POLYP (BENIGN)  . excision of breast cysts     hx of multiple cyst aspirations  . EXPLORATORY LAPAROTOMY    . MASTECTOMY MODIFIED RADICAL Bilateral 10/09/2019   Procedure: RIGHT MASTECTOMY MODIFIED RADICAL AND LEFT TOTAL MASTECTOMY;  Surgeon: Rolm Bookbinder, MD;  Location: [place];  Service: General;  Laterality: Bilateral;  . PORTA CATH INSERTION N/A 08/06/2019   Procedure: PORTA CATH INSERTION;  Surgeon: Katha Cabal, MD;  Location: [facility];  Service: Cardiovascular;  Laterality: N/A;  . PORTACATH PLACEMENT Left 08/05/2019   Procedure: INSERTION PORT-A-CATH, Attempted;  Surgeon: Jules Husbands, MD;  Location: [facility];  Service: General;  Laterality: Left;  . TUBAL LIGATION      Social History   Socioeconomic History  . Marital status: Married    Spouse name: Not on file  . Number of children: 1  .  Years of education: Not on file  . Highest education level: Not on file  Occupational History  . Occupation: [employer]  Tobacco Use  . Smoking status: Never Smoker  . Smokeless tobacco: Never Used  Vaping Use  . Vaping Use: Never used  Substance and Sexual Activity  . Alcohol use: No    Alcohol/week: 0.0 standard drinks  . Drug use: No  . Sexual activity: Yes    Birth control/protection: Surgical    Comment: Hysterectomy  Other Topics Concern  . Not on file  Social History Narrative   Married in 1997/02/21, divorced as of 02-21-17, 1 son from previous relationship   Brother with MVA at 34, in rest home for many years as of 02-21-18- died of covid recently   Social Determinants of Radio broadcast assistant Strain: [place]   . Difficulty of Paying Living Expenses: Not hard at all  Food Insecurity: No Food Insecurity  . Worried About Charity fundraiser in the Last Year: Never true  . Ran Out of Food in the Last Year: Never true  Transportation Needs: No Transportation Needs  . Lack of Transportation (Medical): No  . Lack of Transportation (Non-Medical): No  Physical Activity: Inactive  . Days of Exercise per Week: 0 days  . Minutes of Exercise per Session: 0 min  Stress: No Stress Concern Present  . Feeling of Stress : Not at all  Social Connections: Not on file  Intimate Partner Violence: Not At Risk  . Fear of Current or Ex-Partner: No  . Emotionally Abused: No  . Physically Abused: No  . Sexually Abused: No    Family History  Problem Relation Age of Onset  . Breast cancer Mother 80  . Hypertension Mother   . Colon cancer Father 61  . Hyperlipidemia Father   . Ovarian cancer Maternal Aunt 80  . Breast cancer Cousin   . Gallbladder disease Paternal Grandmother   . Liver cancer Cousin 70       Malignant  . Cancer Maternal Uncle        unsure type     Current Outpatient Medications:  .  acetaminophen (TYLENOL) 650 MG CR tablet, Take 650 mg by mouth every 8 (eight) hours  as needed for pain., Disp: , Rfl:  .  ascorbic acid (VITAMIN C) 500 MG tablet, Take 500 mg by mouth daily., Disp: , Rfl:  .  atorvastatin (LIPITOR) 40 MG tablet, Take 40 mg by mouth at bedtime. , Disp: , Rfl: 1 .  Calcium Carbonate-Vit D-Min (CALCIUM 1200 PO), Take 4 tablets by mouth daily., Disp: , Rfl:  .  cholecalciferol (VITAMIN D3) 25 MCG (1000 UNIT) tablet, Take 1 tablet (1,000 Units total) by mouth daily., Disp: , Rfl:  .  clonazePAM (KLONOPIN) 0.5 MG tablet, Take 0.5 mg by mouth 2 (two) times daily. , Disp: , Rfl:  .  DEXILANT 30 MG capsule, TAKE 1 CAPSULE BY MOUTH ONCE DAILY, Disp: 30 capsule, Rfl: 6 .  DULoxetine (CYMBALTA) 60 MG capsule, Take 60 mg by mouth daily. , Disp: , Rfl:  .  fentaNYL (DURAGESIC) 12 MCG/HR, Place 1 patch onto the skin every 3 (three) days., Disp: 10 patch, Rfl: 0 .  gabapentin (NEURONTIN) 100 MG capsule, Take 100 mg by mouth at bedtime., Disp: , Rfl:  .  HYDROmorphone HCl (DILAUDID) 1 MG/ML LIQD, Take 0.5 mLs (0.5 mg total) by mouth every 4 (four) hours as needed for severe pain., Disp: 90 mL, Rfl: 0 .  loratadine (CLARITIN) 10 MG tablet, Take 1 tablet (10 mg total) by mouth daily., Disp: , Rfl:  .  Plecanatide (TRULANCE) 3 MG TABS, Take 3 mg by mouth daily., Disp: 30 tablet, Rfl: 11 .  polyethylene glycol (MIRALAX / GLYCOLAX) 17 g packet, Take 17 g by mouth daily., Disp: , Rfl:  .  sulfamethoxazole-trimethoprim (BACTRIM DS) 800-160 MG tablet, Take 1 tablet by mouth 2 (two) times daily., Disp: 14 tablet, Rfl: 0 .  valACYclovir (VALTREX) 500 MG tablet, Take 1 tablet (500 mg total) by mouth daily., Disp: 90 tablet, Rfl: 3 .  vitamin B-12 (CYANOCOBALAMIN) 500 MCG tablet, Take 500 mcg by mouth daily., Disp: , Rfl:  .  dexamethasone (DECADRON) 4 MG tablet, Take 2 tablets (8 mg) daily for 3 days after chemotherapy. Take with food. (Patient not taking: Reported on 02/28/2021), Disp: 30 tablet, Rfl: 1 .  furosemide (LASIX) 20 MG tablet, Take 1 tablet (20 mg total) by mouth  every other day. (Patient not taking: No sig reported), Disp: , Rfl:  .  lactulose (CHRONULAC) 10 GM/15ML solution, Take 45  mLs (30 g total) by mouth 2 (two) times daily. (Patient not taking: Reported on 02/28/2021), Disp: 236 mL, Rfl: 1 .  loperamide (IMODIUM A-D) 2 MG tablet, Take 2 at diarrhea onset, then 1 every 2hr until 12hrs with no BM. May take 2 every 4hrs at night. If diarrhea recurs repeat. (Patient not taking: Reported on 02/28/2021), Disp: 100 tablet, Rfl: 1 .  NON FORMULARY, Take 5 mLs by mouth 4 (four) times daily as needed. Swish and swallow 5-10 ml of Medicated Mouthwash 4 x per day as needed (Patient not taking: No sig reported), Disp: , Rfl:  .  OLANZapine (ZYPREXA) 10 MG tablet, Take 1 tablet (10 mg total) by mouth at bedtime. For nausea or vomiting (Patient not taking: No sig reported), Disp: 30 tablet, Rfl: 0 .  pregabalin (LYRICA) 75 MG capsule, Take 1 capsule (75 mg total) by mouth 2 (two) times daily. (Patient not taking: No sig reported), Disp: 60 capsule, Rfl: 2 .  sennosides-docusate sodium (SENOKOT-S) 8.6-50 MG tablet, Take 1-2 tablets by mouth at bedtime. (Patient not taking: Reported on 02/28/2021), Disp: , Rfl:  .  sucralfate (CARAFATE) 1 g tablet, Take 1 g by mouth 3 (three) times daily. (Patient not taking: No sig reported), Disp: , Rfl:  .  triamcinolone (KENALOG) 0.1 %, Apply 1 application topically 2 (two) times daily as needed. For itching (Patient not taking: No sig reported), Disp: 80 g, Rfl: 0  Physical exam:  Vitals:   02/28/21 1708  BP: (!) 115/59  Pulse: 83  Resp: 16  Temp: 98.9 F (37.2 C)   Physical Exam Constitutional:      General: She is not in acute distress. Cardiovascular:     Rate and Rhythm: Normal rate and regular rhythm.     Heart sounds: Normal heart sounds.  Pulmonary:     Effort: Pulmonary effort is normal.     Breath sounds: Normal breath sounds.  Abdominal:     General: Bowel sounds are normal.     Palpations: Abdomen is soft.   Skin:    General: Skin is warm and dry.  Neurological:     Mental Status: She is alert and oriented to person, place, and time.     Patient is s/p b/l mastectomy with no reconstruction. Right axillary LN is enlarged and there appears to be overlying skin breakdown. Right chest wall is erythematous and there is local warmth   CMP Latest Ref Rng & Units 02/24/2021  Glucose 70 - 99 mg/dL 130(H)  BUN 8 - 23 mg/dL 15  Creatinine 0.44 - 1.00 mg/dL 0.83  Sodium 135 - 145 mmol/L 137  Potassium 3.5 - 5.1 mmol/L 4.1  Chloride 98 - 111 mmol/L 102  CO2 22 - 32 mmol/L 25  Calcium 8.9 - 10.3 mg/dL 8.8(L)  Total Protein 6.5 - 8.1 g/dL 6.6  Total Bilirubin 0.3 - 1.2 mg/dL 0.5  Alkaline Phos 38 - 126 U/L 82  AST 15 - 41 U/L 31  ALT 0 - 44 U/L 29   CBC Latest Ref Rng & Units 02/24/2021  WBC 4.0 - 10.5 K/uL 6.9  Hemoglobin 12.0 - 15.0 g/dL 8.9(L)  Hematocrit 36.0 - 46.0 % 28.1(L)  Platelets 150 - 400 K/uL 162      DG Chest 1 View  Result Date: 02/23/2021 CLINICAL DATA:  Status post left thoracentesis EXAM: CHEST  1 VIEW COMPARISON:  02/21/2021 FINDINGS: Heart size is within normal limits. Small pneumothorax noted along the medial apex of the left chest.  Interval decrease of left pleural effusion. No significant right pleural effusion and fide on radiograph. Bilateral parenchymal opacities again seen. IMPRESSION: Small left medial apical pneumothorax status post left thoracentesis. Electronically Signed   By: Miachel Roux M.D.   On: 02/23/2021 09:31   CT CHEST ABDOMEN PELVIS W CONTRAST  Result Date: 02/16/2021 CLINICAL DATA:  History of metastatic breast cancer. Increased swelling in the right upper chest area. EXAM: CT CHEST, ABDOMEN, AND PELVIS WITH CONTRAST TECHNIQUE: Multidetector CT imaging of the chest, abdomen and pelvis was performed following the standard protocol during bolus administration of intravenous contrast. CONTRAST:  147mL OMNIPAQUE IOHEXOL 300 MG/ML  SOLN COMPARISON:  CT scan  07/07/2020 and PET-CT 12/09/2020 FINDINGS: CT CHEST FINDINGS Cardiovascular: Heart is normal in size. No pericardial effusion. The aorta is normal in caliber. No dissection. The branch vessels are patent. No definite coronary artery calcifications. Mediastinum/Nodes: New mediastinal and hilar lymphadenopathy. Some of the adenopathy appears slightly necrotic. 14 mm precarinal lymph node on image 21/2. This previously measured 6 mm. 10 mm right hilar node on image 23/2 11.5 mm right-sided subcarinal node on image number 29/2 Smaller bilateral hilar lymph nodes are also new and appears somewhat necrotic. There is moderate contrast in the esophagus which could suggest reflux. Lungs/Pleura: New bilateral pleural effusions with overlying atelectasis. 15 mm subpleural density in the right upper lobe anteriorly has vessels entering in it and could be an area of rounded atelectasis. Other smaller pleural or subpleural lesions somewhat worrisome for pleural mets. A right-sided thoracentesis may be helpful. Do not see any definite metastatic pulmonary nodules. Patchy ground-glass opacity could suggest edema or inflammation. Stable apical scarring changes. Musculoskeletal: There is a rim enhancing low-attenuation lesion in the right axilla located between surgical clips. This was also noted on the prior PET-CT and was hypermetabolic. Findings consistent with necrotic tumor. This measures a maximum of 2.8 x 2.5 cm. It previously measured approximately 18 x 17 mm. Irregular enhancing lesion involves the right chest wall anteriorly near the sternum. This measures a maximum of 25 mm image 23/2. This previously measured 18.5 mm. Slightly more inferiorly and medially there is a rim enhancing necrotic appearing mass measuring 3.3 x 2.3 cm. This is causing erosion of the right aspect of the sternum and likely originated in the bone. It previously measured 2.7 x 2.0 cm. Stable elongated fluid collection involving the right chest wall  likely a postoperative seroma. I do not see any lytic or sclerotic vertebral body lesions to suggest metastatic disease. The ribs appear intact. CT ABDOMEN PELVIS FINDINGS Hepatobiliary: No hepatic lesions to suggest metastatic disease. No intrahepatic biliary dilatation. The gallbladder is unremarkable. No common bile duct dilatation. Pancreas: No mass, inflammation or ductal dilatation. Spleen: Normal size. Few small splenic lesions are stable and likely benign. Adrenals/Urinary Tract: Adrenal glands are normal. No renal lesions or hydronephrosis. Bladder is unremarkable. Stomach/Bowel: The stomach, duodenum, small bowel and colon are unremarkable. No acute inflammatory changes, mass lesions or obstructive findings. Vascular/Lymphatic: Stable scattered vascular calcifications. No aneurysm or dissection. The branch vessels are patent. Borderline enlarged celiac axis and periportal lymph nodes are noted. No retroperitoneal lymphadenopathy. No pelvic lymphadenopathy. Reproductive: Surgically absent. Other: No pelvic mass or adenopathy. No free pelvic fluid collections. No inguinal mass or adenopathy. No abdominal wall hernia or subcutaneous lesions. Musculoskeletal: No lytic or sclerotic bone lesions involving the lumbar spine or pelvis are identified. IMPRESSION: 1. New and progressive mediastinal and hilar lymphadenopathy. Some of the adenopathy appears slightly necrotic. 2.  Slightly larger rim enhancing necrotic mass in the right axilla. 3. Two enlarging adjacent necrotic appearing lesions involving the right chest wall. The larger lesion is causing erosion of the right aspect of the sternum (or originated in the sternum). 4. New bilateral pleural effusions with overlying atelectasis. 5. 15 mm subpleural density in the right upper lobe anteriorly has vessels entering in it and could be an area of rounded atelectasis. Other smaller pleural or subpleural lesions somewhat worrisome for pleural mets. A right-sided  thoracentesis may be helpful. 6. Borderline enlarged celiac axis and periportal lymph nodes. No other findings for metastatic disease involving the abdomen/pelvis. 7. Stable elongated fluid collection involving the right chest wall, likely a postoperative seroma. Electronically Signed   By: Marijo Sanes M.D.   On: 02/16/2021 14:39   US Venous Img Upper Uni Right  Result Date: 02/04/2021 CLINICAL DATA:  Localized edema the right upper extremity with pain for greater than 1 year EXAM: RIGHT UPPER EXTREMITY VENOUS DOPPLER ULTRASOUND TECHNIQUE: Gray-scale sonography with graded compression, as well as color Doppler and duplex ultrasound were performed to evaluate the upper extremity deep venous system from the level of the subclavian vein and including the jugular, axillary, basilic, radial, ulnar and upper cephalic vein. Spectral Doppler was utilized to evaluate flow at rest and with distal augmentation maneuvers. COMPARISON:  None. FINDINGS: Contralateral Subclavian Vein: Respiratory phasicity is normal and symmetric with the symptomatic side. No evidence of thrombus. Normal compressibility. Internal Jugular Vein: No evidence of thrombus. Normal compressibility, respiratory phasicity and response to augmentation. Subclavian Vein: No evidence of thrombus. Normal compressibility, respiratory phasicity and response to augmentation. Axillary Vein: No evidence of thrombus. Normal compressibility, respiratory phasicity and response to augmentation. Cephalic Vein: No evidence of thrombus. Normal compressibility, respiratory phasicity and response to augmentation. Basilic Vein: No evidence of thrombus. Normal compressibility, respiratory phasicity and response to augmentation. Brachial Veins: No evidence of thrombus. Normal compressibility, respiratory phasicity and response to augmentation. Radial Veins: No evidence of thrombus. Normal compressibility, respiratory phasicity and response to augmentation. Ulnar Veins: No  evidence of thrombus. Normal compressibility, respiratory phasicity and response to augmentation. Other Findings:  None visualized. IMPRESSION: No evidence of DVT within the right upper extremity. Electronically Signed   By: Constance Holster M.D.   On: 02/04/2021 17:37   DG Chest Port 1 View  Result Date: 02/28/2021 CLINICAL DATA:  65 year old female with a history of pleural effusion status post thoracentesis EXAM: PORTABLE CHEST 1 VIEW COMPARISON:  02/23/2021 FINDINGS: Cardiomediastinal silhouette unchanged in size and contour. Left chest wall port catheter via IJ approach, unchanged. Opacity at the left lung base persists, with partial obscuration left hemidiaphragm and blunting of the costophrenic angle. No significant residual right-sided pleural effusion. No pneumothorax. Similar appearance of reticulonodular opacities of the lungs in this patient with known breast carcinoma. IMPRESSION: No complicating features status post thoracentesis, with small residual left-sided pleural effusion. Electronically Signed   By: Corrie Mckusick D.O.   On: 02/28/2021 15:00   DG Chest Port 1 View  Result Date: 02/23/2021 CLINICAL DATA:  Follow-up left pneumothorax EXAM: PORTABLE CHEST 1 VIEW COMPARISON:  02/23/2021 FINDINGS: Cardiomediastinal silhouette and pulmonary vasculature are within normal limits. Left chest port again seen. Small left pleural effusion again identified. Previously seen left pneumothorax no longer identified. IMPRESSION: No residual left pneumothorax. Electronically Signed   By: Miachel Roux M.D.   On: 02/23/2021 10:50   DG Chest Port 1 View  Result Date: 02/21/2021 CLINICAL DATA:  Status post  thoracentesis. Metastatic breast cancer. EXAM: PORTABLE CHEST 1 VIEW COMPARISON:  CT chest dated February 16, 2021. FINDINGS: Decreased now trace right pleural effusion status post thoracentesis. No pneumothorax. Unchanged small left pleural effusion and mild left basilar atelectasis. No consolidation.  Unchanged left chest wall port catheter. Stable cardiomediastinal silhouette. No acute osseous abnormality. IMPRESSION: 1. Decreased now trace right pleural effusion status post thoracentesis. No pneumothorax. 2. Unchanged small left pleural effusion. Electronically Signed   By: Titus Dubin M.D.   On: 02/21/2021 16:33   US THORACENTESIS ASP PLEURAL SPACE W/IMG GUIDE  Result Date: 02/28/2021 INDICATION: Patient with history of recurrent metastatic breast cancer with recurrentpleural effusion. Request is for therapeutic right-sided thoracentesis EXAM: ULTRASOUND GUIDED THERAPEUTIC THORACENTESIS MEDICATIONS: Lidocaine 1% 10 mL COMPLICATIONS: None immediate. PROCEDURE: An ultrasound guided thoracentesis was thoroughly discussed with the patient and questions answered. The benefits, risks, alternatives and complications were also discussed. The patient understands and wishes to proceed with the procedure. Written consent was obtained. Ultrasound was performed to localize and mark an adequate pocket of fluid in the right chest. The area was then prepped and draped in the normal sterile fashion. 1% Lidocaine was used for local anesthesia. Under ultrasound guidance a 6 Fr Safe-T-Centesis catheter was introduced. Thoracentesis was performed. The catheter was removed and a dressing applied. FINDINGS: A total of approximately 650 mL of straw-colored fluid was removed. IMPRESSION: Successful ultrasound guided right-sided therapeutic thoracentesis yielding 650 mL of pleural fluid. Read by: Rushie Nyhan, NP Electronically Signed   By: Corrie Mckusick D.O.   On: 02/28/2021 15:21   US THORACENTESIS ASP PLEURAL SPACE W/IMG GUIDE  Result Date: 02/23/2021 INDICATION: Metastatic breast cancer. Bilateral pleural effusions. Right thoracentesis performed on 02/21/2021. Patient returns today for left thoracentesis. EXAM: ULTRASOUND GUIDED LEFT THORACENTESIS MEDICATIONS: None. COMPLICATIONS: SIR Level A - No therapy, no  consequence. PROCEDURE: An ultrasound guided thoracentesis was thoroughly discussed with the patient and questions answered. The benefits, risks, alternatives and complications were also discussed. The patient understands and wishes to proceed with the procedure. Written consent was obtained. Ultrasound was performed to localize and mark an adequate pocket of fluid in the left chest. The area was then prepped and draped in the normal sterile fashion. 1% Lidocaine was used for local anesthesia. Under ultrasound visualization a 19 gauge, 7-cm, Yueh catheter was introduced. Thoracentesis was performed. The catheter was removed and a dressing applied. FINDINGS: A total of approximately 110 mL of straw-colored fluid was removed. Samples were sent to the laboratory as requested by the clinical team. IMPRESSION: Successful ultrasound guided left thoracentesis yielding 110 mL of pleural fluid. Electronically Signed   By: Miachel Roux M.D.   On: 02/23/2021 10:52   US THORACENTESIS ASP PLEURAL SPACE W/IMG GUIDE  Result Date: 02/21/2021 INDICATION: Patient with history of recurrent metastatic breast cancer. Found to have pleural effusion. Request is for therapeutic and diagnostic thoracentesis EXAM: ULTRASOUND GUIDED THERAPEUTIC AND DIAGNOSTIC THORACENTESIS MEDICATIONS: Lidocaine 1% 10 mL COMPLICATIONS: None immediate. PROCEDURE: An ultrasound guided thoracentesis was thoroughly discussed with the patient and questions answered. The benefits, risks, alternatives and complications were also discussed. The patient understands and wishes to proceed with the procedure. Written consent was obtained. Ultrasound was performed to localize and mark an adequate pocket of fluid in the right chest. The area was then prepped and draped in the normal sterile fashion. 1% Lidocaine was used for local anesthesia. Under ultrasound guidance a 6 Fr Safe-T-Centesis catheter was introduced. Thoracentesis was performed. The catheter was removed  and a dressing applied. FINDINGS: A total of approximately 550 mL of straw-colored fluid was removed. Samples were sent to the laboratory as requested by the clinical team. IMPRESSION: Successful ultrasound guided therapeutic and diagnostic right-sided thoracentesis yielding 550 mL of pleural fluid. Read by: Rushie Nyhan, NP Electronically Signed   By: Jerilynn Mages.  Shick M.D.   On: 02/21/2021 15:55     Assessment and plan- Patient is a 65 y.o. female with recurrent metastatic metaplastic breast cancer here for acute visit of draining right axillary wound  Patient has skin breakdown from enlarging right axillary adenopathy. There is overlying erythema of right chest wall which is concerning for cellulitis versus involvement of chest wall with malignancy. I will empirically cover her with bactrim given her multiple drug allergies. She is seeing [facility] for second opinion tomorrow. We have not started fourth line sacituzumab yet. Recommend putting dry gauze over the area of right axillary skin breakdown. I would prefer systemic chemotherapy instead of palliative RT to the area which can further worsen her lymphedema.   She will keep her appt with me in 2 weeks pending [facility] recs   Visit Diagnosis 1. Carcinoma of right breast metastatic to axillary lymph node ([place])   2. Chest wall erythema      Dr. Randa Evens, MD, MPH [facility] [phone] 03/06/2021 2:24 PM

## 2021-03-07 ENCOUNTER — Telehealth: Payer: Self-pay | Admitting: *Deleted

## 2021-03-07 ENCOUNTER — Other Ambulatory Visit: Payer: Self-pay | Admitting: Internal Medicine

## 2021-03-07 ENCOUNTER — Other Ambulatory Visit: Payer: Self-pay | Admitting: *Deleted

## 2021-03-07 ENCOUNTER — Other Ambulatory Visit: Payer: Self-pay | Admitting: Nurse Practitioner

## 2021-03-07 DIAGNOSIS — J90 Pleural effusion, not elsewhere classified: Secondary | ICD-10-CM

## 2021-03-07 DIAGNOSIS — C50911 Malignant neoplasm of unspecified site of right female breast: Secondary | ICD-10-CM

## 2021-03-07 DIAGNOSIS — Z171 Estrogen receptor negative status [ER-]: Secondary | ICD-10-CM | POA: Diagnosis not present

## 2021-03-07 DIAGNOSIS — D0512 Intraductal carcinoma in situ of left breast: Secondary | ICD-10-CM | POA: Diagnosis not present

## 2021-03-07 DIAGNOSIS — C50511 Malignant neoplasm of lower-outer quadrant of right female breast: Secondary | ICD-10-CM | POA: Diagnosis not present

## 2021-03-07 DIAGNOSIS — C50912 Malignant neoplasm of unspecified site of left female breast: Secondary | ICD-10-CM | POA: Diagnosis not present

## 2021-03-07 DIAGNOSIS — C773 Secondary and unspecified malignant neoplasm of axilla and upper limb lymph nodes: Secondary | ICD-10-CM | POA: Diagnosis not present

## 2021-03-07 NOTE — Progress Notes (Signed)
Spoke to patient. She is requiring multiple thoracentesis and continues to experience progressive shortness of breath between thoracentesis. She requests pleurx catheter placement. Order placed. Will order supplies in anticipation of placement.

## 2021-03-07 NOTE — Telephone Encounter (Signed)
Spoke to patient. Will plan for pleurx placement. Order placed. Faxed order to [facility] for supplies. Will coordinate with nursing for teaching. Anderson Malta, could you please assist with getting her scheduled?

## 2021-03-07 NOTE — Telephone Encounter (Signed)
Pt spoke to [facility] about pleurex catheter and Lauren working on it. Then pt called later in day to check on it and pt states she can't wait to get thoracentesis til Friday. She needs one soon. She sits in chair to sleep- can't tolerate otherwise. She state she is sob. I filled out paper for both thoracentesis tom. If possible and then pleurex Friday.. will check with janet in am about if they can do it tom.pt made aware of this and will call her.

## 2021-03-07 NOTE — Telephone Encounter (Signed)
Patient called requesting to be set up to have fluid removed from her right lung to relieve her shortness of breath asap. She is also requesting to have a Pleur ex catheter inserted. Please arrange and contact patient with appointment

## 2021-03-07 NOTE — Telephone Encounter (Signed)
Lauren can you please look into this?

## 2021-03-07 NOTE — Telephone Encounter (Signed)
Spoke to pt and she wants thoracentesis now. I filled out paper and sent both the thoracentesis paper and the other paper for pleurex. Debra Little said that she has to have one before Friday. She is sitting up in chair to sleep. Can't tolerate any other position

## 2021-03-08 ENCOUNTER — Ambulatory Visit
Admission: RE | Admit: 2021-03-08 | Discharge: 2021-03-08 | Disposition: A | Discharge status: Home / Self Care | Payer: Medicare Other | Source: Ambulatory Visit | Attending: Oncology | Admitting: Oncology

## 2021-03-08 ENCOUNTER — Ambulatory Visit: Payer: Medicare Other | Admitting: Oncology

## 2021-03-08 ENCOUNTER — Ambulatory Visit: Payer: Medicare Other

## 2021-03-08 ENCOUNTER — Ambulatory Visit
Admission: RE | Admit: 2021-03-08 | Discharge: 2021-03-08 | Disposition: A | Discharge status: Home / Self Care | Payer: Medicare Other | Source: Ambulatory Visit | Attending: Student | Admitting: Student

## 2021-03-08 ENCOUNTER — Other Ambulatory Visit
Admission: RE | Admit: 2021-03-08 | Discharge: 2021-03-08 | Disposition: A | Discharge status: Home / Self Care | Payer: Medicare Other | Source: Ambulatory Visit | Attending: Oncology | Admitting: Oncology

## 2021-03-08 ENCOUNTER — Other Ambulatory Visit: Payer: Medicare Other

## 2021-03-08 ENCOUNTER — Other Ambulatory Visit: Payer: Self-pay | Admitting: *Deleted

## 2021-03-08 ENCOUNTER — Other Ambulatory Visit: Payer: Self-pay | Admitting: Nurse Practitioner

## 2021-03-08 ENCOUNTER — Other Ambulatory Visit: Payer: Self-pay

## 2021-03-08 ENCOUNTER — Other Ambulatory Visit: Payer: Self-pay | Admitting: Student

## 2021-03-08 DIAGNOSIS — C50911 Malignant neoplasm of unspecified site of right female breast: Secondary | ICD-10-CM

## 2021-03-08 DIAGNOSIS — J90 Pleural effusion, not elsewhere classified: Secondary | ICD-10-CM

## 2021-03-08 DIAGNOSIS — Z20822 Contact with and (suspected) exposure to covid-19: Secondary | ICD-10-CM | POA: Diagnosis not present

## 2021-03-08 DIAGNOSIS — Z9889 Other specified postprocedural states: Secondary | ICD-10-CM

## 2021-03-08 DIAGNOSIS — C773 Secondary and unspecified malignant neoplasm of axilla and upper limb lymph nodes: Secondary | ICD-10-CM | POA: Diagnosis not present

## 2021-03-08 DIAGNOSIS — J9811 Atelectasis: Secondary | ICD-10-CM | POA: Diagnosis not present

## 2021-03-08 LAB — SARS CORONAVIRUS 2 BY RT PCR (HOSPITAL ORDER, PERFORMED IN ~~LOC~~ HOSPITAL LAB): SARS Coronavirus 2: NEGATIVE

## 2021-03-08 NOTE — Procedures (Signed)
PROCEDURE SUMMARY:  Successful US guided right thoracentesis. Yielded 775 of amber-colored fluid. Pt tolerated procedure well. No immediate complications.  CXR ordered; no post-procedure pneumothorax identified.   EBL < 2 mL  Theresa Duty, NP 03/08/2021 1:04 PM

## 2021-03-09 ENCOUNTER — Other Ambulatory Visit: Payer: Self-pay | Admitting: Radiology

## 2021-03-09 ENCOUNTER — Ambulatory Visit
Admission: RE | Admit: 2021-03-09 | Discharge: 2021-03-09 | Disposition: A | Discharge status: Home / Self Care | Payer: Medicare Other | Source: Ambulatory Visit | Attending: Interventional Radiology | Admitting: Interventional Radiology

## 2021-03-09 ENCOUNTER — Other Ambulatory Visit: Payer: Self-pay | Admitting: Interventional Radiology

## 2021-03-09 ENCOUNTER — Ambulatory Visit
Admission: RE | Admit: 2021-03-09 | Discharge: 2021-03-09 | Disposition: A | Discharge status: Home / Self Care | Payer: Medicare Other | Source: Ambulatory Visit | Attending: Nurse Practitioner | Admitting: Nurse Practitioner

## 2021-03-09 DIAGNOSIS — Z9889 Other specified postprocedural states: Secondary | ICD-10-CM | POA: Insufficient documentation

## 2021-03-09 DIAGNOSIS — J90 Pleural effusion, not elsewhere classified: Secondary | ICD-10-CM | POA: Diagnosis not present

## 2021-03-09 DIAGNOSIS — R0989 Other specified symptoms and signs involving the circulatory and respiratory systems: Secondary | ICD-10-CM | POA: Insufficient documentation

## 2021-03-09 NOTE — Procedures (Signed)
Pre procedural Dx: Symptomatic pleural effusion Post procedural Dx: Same  Successful US guided left sided thoracentesis yielding 250 cc of serous pleural fluid.    EBL: None Complications: None immediate.  Ronny Bacon, MD Pager #: [phone]

## 2021-03-10 ENCOUNTER — Inpatient Hospital Stay: Payer: Medicare Other

## 2021-03-10 ENCOUNTER — Encounter: Payer: Self-pay | Admitting: Oncology

## 2021-03-10 ENCOUNTER — Inpatient Hospital Stay: Payer: Medicare Other | Admitting: Hospice and Palliative Medicine

## 2021-03-10 ENCOUNTER — Inpatient Hospital Stay (HOSPITAL_BASED_OUTPATIENT_CLINIC_OR_DEPARTMENT_OTHER): Payer: Medicare Other | Admitting: Oncology

## 2021-03-10 VITALS — BP 108/57 | HR 93 | Temp 97.6°F | Resp 18 | Wt 163.3 lb

## 2021-03-10 DIAGNOSIS — Z171 Estrogen receptor negative status [ER-]: Secondary | ICD-10-CM

## 2021-03-10 DIAGNOSIS — D6481 Anemia due to antineoplastic chemotherapy: Secondary | ICD-10-CM | POA: Diagnosis not present

## 2021-03-10 DIAGNOSIS — Z803 Family history of malignant neoplasm of breast: Secondary | ICD-10-CM | POA: Diagnosis not present

## 2021-03-10 DIAGNOSIS — Z7189 Other specified counseling: Secondary | ICD-10-CM | POA: Diagnosis not present

## 2021-03-10 DIAGNOSIS — R5383 Other fatigue: Secondary | ICD-10-CM | POA: Diagnosis not present

## 2021-03-10 DIAGNOSIS — Z5111 Encounter for antineoplastic chemotherapy: Secondary | ICD-10-CM | POA: Diagnosis not present

## 2021-03-10 DIAGNOSIS — R221 Localized swelling, mass and lump, neck: Secondary | ICD-10-CM | POA: Diagnosis not present

## 2021-03-10 DIAGNOSIS — C773 Secondary and unspecified malignant neoplasm of axilla and upper limb lymph nodes: Secondary | ICD-10-CM | POA: Diagnosis not present

## 2021-03-10 DIAGNOSIS — Z9221 Personal history of antineoplastic chemotherapy: Secondary | ICD-10-CM | POA: Diagnosis not present

## 2021-03-10 DIAGNOSIS — Z79899 Other long term (current) drug therapy: Secondary | ICD-10-CM | POA: Diagnosis not present

## 2021-03-10 DIAGNOSIS — G893 Neoplasm related pain (acute) (chronic): Secondary | ICD-10-CM | POA: Diagnosis not present

## 2021-03-10 DIAGNOSIS — Z9013 Acquired absence of bilateral breasts and nipples: Secondary | ICD-10-CM | POA: Diagnosis not present

## 2021-03-10 DIAGNOSIS — C50919 Malignant neoplasm of unspecified site of unspecified female breast: Secondary | ICD-10-CM | POA: Diagnosis not present

## 2021-03-10 DIAGNOSIS — R5381 Other malaise: Secondary | ICD-10-CM | POA: Diagnosis not present

## 2021-03-10 DIAGNOSIS — T451X5A Adverse effect of antineoplastic and immunosuppressive drugs, initial encounter: Secondary | ICD-10-CM

## 2021-03-10 DIAGNOSIS — C50511 Malignant neoplasm of lower-outer quadrant of right female breast: Secondary | ICD-10-CM

## 2021-03-10 DIAGNOSIS — C787 Secondary malignant neoplasm of liver and intrahepatic bile duct: Secondary | ICD-10-CM | POA: Diagnosis not present

## 2021-03-10 DIAGNOSIS — Z800 Family history of malignant neoplasm of digestive organs: Secondary | ICD-10-CM | POA: Diagnosis not present

## 2021-03-10 LAB — CBC WITH DIFFERENTIAL/PLATELET
Abs Immature Granulocytes: 0.06 10*3/uL (ref 0.00–0.07)
Basophils Absolute: 0.1 10*3/uL (ref 0.0–0.1)
Basophils Relative: 1 %
Eosinophils Absolute: 0.1 10*3/uL (ref 0.0–0.5)
Eosinophils Relative: 1 %
HCT: 31.1 % — ABNORMAL LOW (ref 36.0–46.0)
Hemoglobin: 9.6 g/dL — ABNORMAL LOW (ref 12.0–15.0)
Immature Granulocytes: 1 %
Lymphocytes Relative: 7 %
Lymphs Abs: 0.6 10*3/uL — ABNORMAL LOW (ref 0.7–4.0)
MCH: 26.7 pg (ref 26.0–34.0)
MCHC: 30.9 g/dL (ref 30.0–36.0)
MCV: 86.4 fL (ref 80.0–100.0)
Monocytes Absolute: 1.4 10*3/uL — ABNORMAL HIGH (ref 0.1–1.0)
Monocytes Relative: 15 %
Neutro Abs: 7.2 10*3/uL (ref 1.7–7.7)
Neutrophils Relative %: 75 %
Platelets: 315 10*3/uL (ref 150–400)
RBC: 3.6 MIL/uL — ABNORMAL LOW (ref 3.87–5.11)
RDW: 17.2 % — ABNORMAL HIGH (ref 11.5–15.5)
WBC: 9.4 10*3/uL (ref 4.0–10.5)
nRBC: 0 % (ref 0.0–0.2)

## 2021-03-10 LAB — MAGNESIUM: Magnesium: 1.7 mg/dL (ref 1.7–2.4)

## 2021-03-10 LAB — COMPREHENSIVE METABOLIC PANEL
ALT: 19 U/L (ref 0–44)
AST: 29 U/L (ref 15–41)
Albumin: 2.8 g/dL — ABNORMAL LOW (ref 3.5–5.0)
Alkaline Phosphatase: 84 U/L (ref 38–126)
Anion gap: 11 (ref 5–15)
BUN: 15 mg/dL (ref 8–23)
CO2: 21 mmol/L — ABNORMAL LOW (ref 22–32)
Calcium: 8.5 mg/dL — ABNORMAL LOW (ref 8.9–10.3)
Chloride: 104 mmol/L (ref 98–111)
Creatinine, Ser: 0.87 mg/dL (ref 0.44–1.00)
GFR, Estimated: >60 mL/min (ref >60)
Glucose, Bld: 139 mg/dL — ABNORMAL HIGH (ref 70–99)
Potassium: 4.1 mmol/L (ref 3.5–5.1)
Sodium: 136 mmol/L (ref 135–145)
Total Bilirubin: 0.3 mg/dL (ref 0.3–1.2)
Total Protein: 6.8 g/dL (ref 6.5–8.1)

## 2021-03-10 LAB — PHOSPHORUS: Phosphorus: 3.9 mg/dL (ref 2.5–4.6)

## 2021-03-10 MED ORDER — HEPARIN SOD (PORK) LOCK FLUSH 100 UNIT/ML IV SOLN
500.0000 [IU] | Freq: Once | INTRAVENOUS | Status: AC
Start: 2021-03-10 — End: 2021-03-10
  Administered 2021-03-10: 500 [IU] via INTRAVENOUS
  Filled 2021-03-10: qty 5

## 2021-03-10 MED ORDER — SODIUM CHLORIDE 0.9% FLUSH
10.0000 mL | Freq: Once | INTRAVENOUS | Status: AC
  Administered 2021-03-10: 10 mL via INTRAVENOUS
  Filled 2021-03-10: qty 10

## 2021-03-10 NOTE — Progress Notes (Signed)
Pt in for follow up, reports feeling more anxious today.  Having some pain in right shoulder when coughs. States has been more short of breath when ambulating.

## 2021-03-10 NOTE — Progress Notes (Signed)
Hematology/Oncology Consult note [facility]  Telephone:([phone] [phone]  Patient Care Team: Tonia Ghent, MD as PCP - General (Family Medicine) Bary Castilla, Forest Gleason, MD (General Surgery) Modesto Charon, MD (Family Medicine) Laneta Simmers as Physician Assistant (Urology) [name] Javier Docker, MD as Consulting Physician (Cardiology) Sindy Guadeloupe, MD as Consulting Physician (Hematology and Oncology) Noreene Filbert, MD as Referring Physician (Radiation Oncology)   Name of the patient: Debra Little  [id number]  [birth year]   Date of visit: 03/10/21  Diagnosis- 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 in the past and has undergone breast biopsies which did not previously showed malignancy. More recently patient noted some red discoloration around her right perioral as well as a palpable mass which led to a diagnostic mammogram on the right side her prior mammogram in February 2020 was normal in August 2020 she was noted to have a 2.7 x 2 x 2.3 cm mass in her right breast along with 4 morphologically abnormal lymph nodes. She underwent breast biopsy as well as lymph node biopsy which showed grade 3 invasive mammary carcinoma. ER PR and HER-2/neunegative.Sections show high-grade invasive carcinoma with focal squamous differentiation and pinpoint keratin ideation. The features are concerning for metaplastic differentiation.[name] Lymphovascular invasion present. There was extranodal extension present on the lymph node biopsy.   PET CT scan showed hypermetabolic possible satellite nodule just cephalad to the right breast lesion. Equivocal inferior breast and left axillary nodal hypermetabolism. No evidence of extrathoracic hypermetabolic  metastases. Lateral right breast primary with right axillary nodal metastases  Patient had 3 cycles of neoadjuvant AC chemotherapy along with Keytruda. There was no significant response in her tumor and patient proceeded with bilateral mastectomy with targeted node dissection. 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 7 lymph nodes were positive for malignancy with extranodal extension overall cancer cellularity was 40% [id number].Patient completed 1 cycle of adjuvant AC chemotherapy and willcomplete weekly Taxol chemotherapy in April 2021. She completed post mastectomy radiation  PET scan in November 2021 was done for ongoing chest wall pain. It showed:1. Aggressive appearing and extensive recurrent right breast cancer with right chest wall involvement as detailed above. 2. No findings for mediastinal/hilar metastatic lymphadenopathy or metastatic pulmonary disease. 3. Single left hepatic lobe liver lesion consistent with metastatic focus. 4. Hypermetabolic soft tissue mass in the left oropharynx/tonsillar region suspicious for primary neoplasm.  NGS testing showed PIK3CA mutation and PD-L1 CPS score of 95. No other actionable mutation was seen.  Patient had disease progression on carboplatin gemcitabine and Keytruda and has been recommended fourth line sacituzumab   Interval history-patient was seen by [name] for right axillary wound.  She was prescribed topical agent to prevent oozing.  She has a dressing in place which has been working well.  Continues to have right chest wall pain and pain with taking deep breaths.  Feels fatigued.  She is still not sure if she wants to proceed with treatment today or not.  She is on a fentanyl patch 12 mcg.  She does not use as much Dilaudid though maybe once or twice a day at most.  ECOG PS- 1 Pain scale- 3 Opioid associated constipation- no  Review of systems- Review of Systems  Constitutional: Positive  for malaise/fatigue. Negative for chills, fever and weight  loss.  HENT: Negative for congestion, ear discharge and nosebleeds.   Eyes: Negative for blurred vision.  Respiratory: Negative for cough, hemoptysis, sputum production, shortness of breath and wheezing.   Cardiovascular: Negative for chest pain, palpitations, orthopnea and claudication.  Gastrointestinal: Negative for abdominal pain, blood in stool, constipation, diarrhea, heartburn, melena, nausea and vomiting.  Genitourinary: Negative for dysuria, flank pain, frequency, hematuria and urgency.  Musculoskeletal: Negative for back pain, joint pain and myalgias.       Right chest wall pain.  Right upper extremity pain and swelling  Skin: Negative for rash.  Neurological: Negative for dizziness, tingling, focal weakness, seizures, weakness and headaches.  Endo/Heme/Allergies: Does not bruise/bleed easily.  Psychiatric/Behavioral: Negative for depression and suicidal ideas. The patient does not have insomnia.       Allergies  Allergen Reactions  . Amitiza [Lubiprostone] Other (See Comments)    Overly effective at higher dose  . Bentyl [Dicyclomine Hcl]     Lack of effect  . Cortisone Other (See Comments)    flush  . Mobic [Meloxicam]     Palpitations.  But can tolerate aleve  . Nitrofurantoin Monohyd Macro   . Sulfonamide Derivatives Nausea Only  . Zoloft [Sertraline Hcl] Diarrhea and Nausea Only  . Buspar [Buspirone] Palpitations  . Celecoxib Rash    REACTION: unspecified  . Doxycycline Palpitations    chest pain  . Penicillins Rash     Past Medical History:  Diagnosis Date  . Anemia    d/t chemo - last Hgb 10.8  . Anxiety    sees Dr. [name]  . BRCA negative    Invitae panel neg except CHEK2 VUS  . Breast cancer ([place]) 2020  . Cyst of breast    per [name]  . Dysrhythmia    Hx of palpitations  . Family history of breast cancer   . Family history of colon cancer   . Family history of leukemia   . Family  history of ovarian cancer   . Fibromyalgia   . GERD (gastroesophageal reflux disease)   . Heart murmur   . Hemorrhoids   . Herpes, genital 04/2015   confirmed with HSV 2 IgG  . Hiatal hernia   . Hypercholesterolemia    Dr. Kyra Searles  . Hyperlipidemia   . IBS (irritable bowel syndrome)    constipation predominant  . IC (interstitial cystitis)    per [facility]  . Mild depression ([place])   . MVP (mitral valve prolapse)    [facility] cards eval 2012  . Osteopenia    2010/2017, DEXA at [facility];spine and fem neck  . PONV (postoperative nausea and vomiting)   . Vitamin D deficiency    history of     Past Surgical History:  Procedure Laterality Date  . ABDOMINAL HYSTERECTOMY    . APPENDECTOMY    . BLADDER SURGERY     1980's  . BREAST EXCISIONAL BIOPSY Right    benign  . BREAST EXCISIONAL BIOPSY Bilateral    benign  . COLONOSCOPY  09/2014   Dr. [name]  . COLONOSCOPY  2009   at [facility] 1 POLYP (BENIGN)  . excision of breast cysts     hx of multiple cyst aspirations  . EXPLORATORY LAPAROTOMY    . MASTECTOMY MODIFIED RADICAL Bilateral 10/09/2019   Procedure: RIGHT MASTECTOMY MODIFIED RADICAL AND LEFT TOTAL MASTECTOMY;  Surgeon: Rolm Bookbinder, MD;  Location: [facility];  Service: General;  Laterality: Bilateral;  . PORTA CATH INSERTION N/A 08/06/2019   Procedure:  PORTA CATH INSERTION;  Surgeon: Katha Cabal, MD;  Location: [place] [facility];  Service: Cardiovascular;  Laterality: N/A;  . PORTACATH PLACEMENT Left 08/05/2019   Procedure: INSERTION PORT-A-CATH, Attempted;  Surgeon: Jules Husbands, MD;  Location: [facility];  Service: General;  Laterality: Left;  . TUBAL LIGATION      Social History   Socioeconomic History  . Marital status: Married    Spouse name: Not on file  . Number of children: 1  . Years of education: Not on file  . Highest education level: Not on file  Occupational History  . Occupation: [employer]  Tobacco Use  . Smoking status: Never Smoker  . Smokeless  tobacco: Never Used  Vaping Use  . Vaping Use: Never used  Substance and Sexual Activity  . Alcohol use: No    Alcohol/week: 0.0 standard drinks  . Drug use: No  . Sexual activity: Yes    Birth control/protection: Surgical    Comment: Hysterectomy  Other Topics Concern  . Not on file  Social History Narrative   Married in 02/04/97, divorced as of 02-04-17, 1 son from previous relationship   Brother with MVA at 13, in rest home for many years as of February 04, 2018- died of covid recently   Social Determinants of Radio broadcast assistant Strain: [place]   . Difficulty of Paying Living Expenses: Not hard at all  Food Insecurity: No Food Insecurity  . Worried About Charity fundraiser in the Last Year: Never true  . Ran Out of Food in the Last Year: Never true  Transportation Needs: No Transportation Needs  . Lack of Transportation (Medical): No  . Lack of Transportation (Non-Medical): No  Physical Activity: Inactive  . Days of Exercise per Week: 0 days  . Minutes of Exercise per Session: 0 min  Stress: No Stress Concern Present  . Feeling of Stress : Not at all  Social Connections: Not on file  Intimate Partner Violence: Not At Risk  . Fear of Current or Ex-Partner: No  . Emotionally Abused: No  . Physically Abused: No  . Sexually Abused: No    Family History  Problem Relation Age of Onset  . Breast cancer Mother 54  . Hypertension Mother   . Colon cancer Father 50  . Hyperlipidemia Father   . Ovarian cancer Maternal Aunt 80  . Breast cancer Cousin   . Gallbladder disease Paternal Grandmother   . Liver cancer Cousin 70       Malignant  . Cancer Maternal Uncle        unsure type     Current Outpatient Medications:  .  acetaminophen (TYLENOL) 650 MG CR tablet, Take 650 mg by mouth every 8 (eight) hours as needed for pain., Disp: , Rfl:  .  ascorbic acid (VITAMIN C) 500 MG tablet, Take 500 mg by mouth daily., Disp: , Rfl:  .  atorvastatin (LIPITOR) 40 MG tablet, Take 40 mg by  mouth at bedtime. , Disp: , Rfl: 1 .  Calcium Carbonate-Vit D-Min (CALCIUM 1200 PO), Take 4 tablets by mouth daily., Disp: , Rfl:  .  cholecalciferol (VITAMIN D3) 25 MCG (1000 UNIT) tablet, Take 1 tablet (1,000 Units total) by mouth daily., Disp: , Rfl:  .  clonazePAM (KLONOPIN) 0.5 MG tablet, Take 0.5 mg by mouth 2 (two) times daily. , Disp: , Rfl:  .  dexamethasone (DECADRON) 4 MG tablet, Take 2 tablets (8 mg) daily for 3 days after chemotherapy. Take with  food. (Patient not taking: Reported on 02/28/2021), Disp: 30 tablet, Rfl: 1 .  DEXILANT 30 MG capsule, TAKE 1 CAPSULE BY MOUTH ONCE DAILY, Disp: 30 capsule, Rfl: 6 .  DULoxetine (CYMBALTA) 60 MG capsule, Take 60 mg by mouth daily. , Disp: , Rfl:  .  fentaNYL (DURAGESIC) 12 MCG/HR, Place 1 patch onto the skin every 3 (three) days., Disp: 10 patch, Rfl: 0 .  furosemide (LASIX) 20 MG tablet, Take 1 tablet (20 mg total) by mouth every other day. (Patient not taking: No sig reported), Disp: , Rfl:  .  gabapentin (NEURONTIN) 100 MG capsule, Take 100 mg by mouth at bedtime., Disp: , Rfl:  .  HYDROmorphone HCl (DILAUDID) 1 MG/ML LIQD, Take 0.5 mLs (0.5 mg total) by mouth every 4 (four) hours as needed for severe pain., Disp: 90 mL, Rfl: 0 .  lactulose (CHRONULAC) 10 GM/15ML solution, Take 45 mLs (30 g total) by mouth 2 (two) times daily. (Patient not taking: Reported on 02/28/2021), Disp: 236 mL, Rfl: 1 .  loperamide (IMODIUM A-D) 2 MG tablet, Take 2 at diarrhea onset, then 1 every 2hr until 12hrs with no BM. May take 2 every 4hrs at night. If diarrhea recurs repeat. (Patient not taking: Reported on 02/28/2021), Disp: 100 tablet, Rfl: 1 .  loratadine (CLARITIN) 10 MG tablet, Take 1 tablet (10 mg total) by mouth daily., Disp: , Rfl:  .  NON FORMULARY, Take 5 mLs by mouth 4 (four) times daily as needed. Swish and swallow 5-10 ml of Medicated Mouthwash 4 x per day as needed (Patient not taking: No sig reported), Disp: , Rfl:  .  OLANZapine (ZYPREXA) 10 MG tablet,  Take 1 tablet (10 mg total) by mouth at bedtime. For nausea or vomiting (Patient not taking: No sig reported), Disp: 30 tablet, Rfl: 0 .  Plecanatide (TRULANCE) 3 MG TABS, Take 3 mg by mouth daily., Disp: 30 tablet, Rfl: 11 .  polyethylene glycol (MIRALAX / GLYCOLAX) 17 g packet, Take 17 g by mouth daily., Disp: , Rfl:  .  pregabalin (LYRICA) 75 MG capsule, Take 1 capsule (75 mg total) by mouth 2 (two) times daily. (Patient not taking: No sig reported), Disp: 60 capsule, Rfl: 2 .  sennosides-docusate sodium (SENOKOT-S) 8.6-50 MG tablet, Take 1-2 tablets by mouth at bedtime. (Patient not taking: Reported on 02/28/2021), Disp: , Rfl:  .  sucralfate (CARAFATE) 1 g tablet, Take 1 g by mouth 3 (three) times daily. (Patient not taking: No sig reported), Disp: , Rfl:  .  sulfamethoxazole-trimethoprim (BACTRIM DS) 800-160 MG tablet, Take 1 tablet by mouth 2 (two) times daily., Disp: 14 tablet, Rfl: 0 .  triamcinolone (KENALOG) 0.1 %, Apply 1 application topically 2 (two) times daily as needed. For itching (Patient not taking: No sig reported), Disp: 80 g, Rfl: 0 .  valACYclovir (VALTREX) 500 MG tablet, Take 1 tablet (500 mg total) by mouth daily., Disp: 90 tablet, Rfl: 3 .  vitamin B-12 (CYANOCOBALAMIN) 500 MCG tablet, Take 500 mcg by mouth daily., Disp: , Rfl:   Physical exam: There were no vitals filed for this visit. Physical Exam Cardiovascular:     Rate and Rhythm: Normal rate and regular rhythm.  Pulmonary:     Effort: Pulmonary effort is normal.     Breath sounds: Normal breath sounds.     Comments: Right chest wall redness is decreased.  Dressing in place over the right axillary wound Musculoskeletal:     Comments: Right upper extremity appears more swollen than the  left  Skin:    General: Skin is warm and dry.  Neurological:     Mental Status: She is alert and oriented to person, place, and time.      CMP Latest Ref Rng & Units 02/24/2021  Glucose 70 - 99 mg/dL 130(H)  BUN 8 - 23 mg/dL 15   Creatinine 0.44 - 1.00 mg/dL 0.83  Sodium 135 - 145 mmol/L 137  Potassium 3.5 - 5.1 mmol/L 4.1  Chloride 98 - 111 mmol/L 102  CO2 22 - 32 mmol/L 25  Calcium 8.9 - 10.3 mg/dL 8.8(L)  Total Protein 6.5 - 8.1 g/dL 6.6  Total Bilirubin 0.3 - 1.2 mg/dL 0.5  Alkaline Phos 38 - 126 U/L 82  AST 15 - 41 U/L 31  ALT 0 - 44 U/L 29   CBC Latest Ref Rng & Units 02/24/2021  WBC 4.0 - 10.5 K/uL 6.9  Hemoglobin 12.0 - 15.0 g/dL 8.9(L)  Hematocrit 36.0 - 46.0 % 28.1(L)  Platelets 150 - 400 K/uL 162    No images are attached to the encounter.  DG Chest 1 View  Result Date: 02/23/2021 CLINICAL DATA:  Status post left thoracentesis EXAM: CHEST  1 VIEW COMPARISON:  02/21/2021 FINDINGS: Heart size is within normal limits. Small pneumothorax noted along the medial apex of the left chest. Interval decrease of left pleural effusion. No significant right pleural effusion and fide on radiograph. Bilateral parenchymal opacities again seen. IMPRESSION: Small left medial apical pneumothorax status post left thoracentesis. Electronically Signed   By: Miachel Roux M.D.   On: 02/23/2021 09:31   CT CHEST ABDOMEN PELVIS W CONTRAST  Result Date: 02/16/2021 CLINICAL DATA:  History of metastatic breast cancer. Increased swelling in the right upper chest area. EXAM: CT CHEST, ABDOMEN, AND PELVIS WITH CONTRAST TECHNIQUE: Multidetector CT imaging of the chest, abdomen and pelvis was performed following the standard protocol during bolus administration of intravenous contrast. CONTRAST:  135mL OMNIPAQUE IOHEXOL 300 MG/ML  SOLN COMPARISON:  CT scan 07/07/2020 and PET-CT 12/09/2020 FINDINGS: CT CHEST FINDINGS Cardiovascular: Heart is normal in size. No pericardial effusion. The aorta is normal in caliber. No dissection. The branch vessels are patent. No definite coronary artery calcifications. Mediastinum/Nodes: New mediastinal and hilar lymphadenopathy. Some of the adenopathy appears slightly necrotic. 14 mm precarinal lymph node  on image 21/2. This previously measured 6 mm. 10 mm right hilar node on image 23/2 11.5 mm right-sided subcarinal node on image number 29/2 Smaller bilateral hilar lymph nodes are also new and appears somewhat necrotic. There is moderate contrast in the esophagus which could suggest reflux. Lungs/Pleura: New bilateral pleural effusions with overlying atelectasis. 15 mm subpleural density in the right upper lobe anteriorly has vessels entering in it and could be an area of rounded atelectasis. Other smaller pleural or subpleural lesions somewhat worrisome for pleural mets. A right-sided thoracentesis may be helpful. Do not see any definite metastatic pulmonary nodules. Patchy ground-glass opacity could suggest edema or inflammation. Stable apical scarring changes. Musculoskeletal: There is a rim enhancing low-attenuation lesion in the right axilla located between surgical clips. This was also noted on the prior PET-CT and was hypermetabolic. Findings consistent with necrotic tumor. This measures a maximum of 2.8 x 2.5 cm. It previously measured approximately 18 x 17 mm. Irregular enhancing lesion involves the right chest wall anteriorly near the sternum. This measures a maximum of 25 mm image 23/2. This previously measured 18.5 mm. Slightly more inferiorly and medially there is a rim enhancing necrotic appearing mass measuring  3.3 x 2.3 cm. This is causing erosion of the right aspect of the sternum and likely originated in the bone. It previously measured 2.7 x 2.0 cm. Stable elongated fluid collection involving the right chest wall likely a postoperative seroma. I do not see any lytic or sclerotic vertebral body lesions to suggest metastatic disease. The ribs appear intact. CT ABDOMEN PELVIS FINDINGS Hepatobiliary: No hepatic lesions to suggest metastatic disease. No intrahepatic biliary dilatation. The gallbladder is unremarkable. No common bile duct dilatation. Pancreas: No mass, inflammation or ductal dilatation.  Spleen: Normal size. Few small splenic lesions are stable and likely benign. Adrenals/Urinary Tract: Adrenal glands are normal. No renal lesions or hydronephrosis. Bladder is unremarkable. Stomach/Bowel: The stomach, duodenum, small bowel and colon are unremarkable. No acute inflammatory changes, mass lesions or obstructive findings. Vascular/Lymphatic: Stable scattered vascular calcifications. No aneurysm or dissection. The branch vessels are patent. Borderline enlarged celiac axis and periportal lymph nodes are noted. No retroperitoneal lymphadenopathy. No pelvic lymphadenopathy. Reproductive: Surgically absent. Other: No pelvic mass or adenopathy. No free pelvic fluid collections. No inguinal mass or adenopathy. No abdominal wall hernia or subcutaneous lesions. Musculoskeletal: No lytic or sclerotic bone lesions involving the lumbar spine or pelvis are identified. IMPRESSION: 1. New and progressive mediastinal and hilar lymphadenopathy. Some of the adenopathy appears slightly necrotic. 2. Slightly larger rim enhancing necrotic mass in the right axilla. 3. Two enlarging adjacent necrotic appearing lesions involving the right chest wall. The larger lesion is causing erosion of the right aspect of the sternum (or originated in the sternum). 4. New bilateral pleural effusions with overlying atelectasis. 5. 15 mm subpleural density in the right upper lobe anteriorly has vessels entering in it and could be an area of rounded atelectasis. Other smaller pleural or subpleural lesions somewhat worrisome for pleural mets. A right-sided thoracentesis may be helpful. 6. Borderline enlarged celiac axis and periportal lymph nodes. No other findings for metastatic disease involving the abdomen/pelvis. 7. Stable elongated fluid collection involving the right chest wall, likely a postoperative seroma. Electronically Signed   By: Marijo Sanes M.D.   On: 02/16/2021 14:39   DG Chest Port 1 View  Result Date: 03/09/2021 CLINICAL  DATA:  History of breast cancer with symptomatic bilateral pleural effusions. Post left-sided thoracentesis. EXAM: PORTABLE CHEST 1 VIEW COMPARISON:  Chest radiograph-03/08/2021; ultrasound-guided left-sided thoracentesis-02/23/2021 (yielding only 110 mL of pleural fluid) FINDINGS: Grossly unchanged cardiac silhouette and mediastinal contours. Stable positioning of support apparatus. Slight reduction in persistent trace potentially partially loculated left-sided effusion post thoracentesis. No pneumothorax. Improved aeration of the left lung base. Unchanged trace right-sided pleural effusion and associated right basilar opacities. Redemonstrated mild pulmonary venous congestion without frank evidence of edema. No new focal airspace opacities. Sequela of previous bilateral mastectomies and axillary adenectomy. No acute osseous abnormalities. IMPRESSION: 1. Slight reduction in persistent trace potentially partially loculated left-sided effusion post thoracentesis. No pneumothorax. 2. Unchanged trace right-sided pleural effusion. 3. Similar findings of pulmonary venous congestion without frank evidence of edema. Electronically Signed   By: Sandi Mariscal M.D.   On: 03/09/2021 09:57   DG Chest Port 1 View  Result Date: 03/08/2021 CLINICAL DATA:  S/P thoracentesis EXAM: PORTABLE CHEST - 1 VIEW COMPARISON:  02/28/2021 FINDINGS: The mediastinal contours are within normal limits. No cardiomegaly. Left internal jugular approach Port-A-Cath in place with the tip at the cavoatrial junction, unchanged. Trace residual right pleural effusion after thoracentesis. Similar appearing right middle lobe linear atelectasis. The left costophrenic angle remains blunted with left basilar subsegmental  atelectasis. Bilateral axillary and left breast surgical clips in place. No acute osseous abnormality. IMPRESSION: 1. No complicating features after right thoracentesis. 2. Similar appearing right middle and left basilar subsegmental  atelectasis with trace left pleural effusion. Ruthann Cancer, MD [facility] Electronically Signed   By: Ruthann Cancer MD   On: 03/08/2021 12:15   DG Chest Port 1 View  Result Date: 02/28/2021 CLINICAL DATA:  65 year old female with a history of pleural effusion status post thoracentesis EXAM: PORTABLE CHEST 1 VIEW COMPARISON:  02/23/2021 FINDINGS: Cardiomediastinal silhouette unchanged in size and contour. Left chest wall port catheter via IJ approach, unchanged. Opacity at the left lung base persists, with partial obscuration left hemidiaphragm and blunting of the costophrenic angle. No significant residual right-sided pleural effusion. No pneumothorax. Similar appearance of reticulonodular opacities of the lungs in this patient with known breast carcinoma. IMPRESSION: No complicating features status post thoracentesis, with small residual left-sided pleural effusion. Electronically Signed   By: Corrie Mckusick D.O.   On: 02/28/2021 15:00   DG Chest Port 1 View  Result Date: 02/23/2021 CLINICAL DATA:  Follow-up left pneumothorax EXAM: PORTABLE CHEST 1 VIEW COMPARISON:  02/23/2021 FINDINGS: Cardiomediastinal silhouette and pulmonary vasculature are within normal limits. Left chest port again seen. Small left pleural effusion again identified. Previously seen left pneumothorax no longer identified. IMPRESSION: No residual left pneumothorax. Electronically Signed   By: Miachel Roux M.D.   On: 02/23/2021 10:50   DG Chest Port 1 View  Result Date: 02/21/2021 CLINICAL DATA:  Status post thoracentesis. Metastatic breast cancer. EXAM: PORTABLE CHEST 1 VIEW COMPARISON:  CT chest dated February 16, 2021. FINDINGS: Decreased now trace right pleural effusion status post thoracentesis. No pneumothorax. Unchanged small left pleural effusion and mild left basilar atelectasis. No consolidation. Unchanged left chest wall port catheter. Stable cardiomediastinal  silhouette. No acute osseous abnormality. IMPRESSION: 1. Decreased now trace right pleural effusion status post thoracentesis. No pneumothorax. 2. Unchanged small left pleural effusion. Electronically Signed   By: Titus Dubin M.D.   On: 02/21/2021 16:33   US THORACENTESIS ASP PLEURAL SPACE W/IMG GUIDE  Result Date: 03/09/2021 INDICATION: History of metastatic breast cancer with recurrent bilateral pleural effusions. Patient underwent ultrasound-guided right sided thoracentesis yesterday returns today for ultrasound-guided left-sided thoracentesis. EXAM: US THORACENTESIS ASP PLEURAL SPACE W/IMG GUIDE COMPARISON:  Left-sided thoracentesis-02/23/2021 (yielding only 110 cc of pleural fluid) Chest radiograph-03/08/2021 MEDICATIONS: None. COMPLICATIONS: None immediate. TECHNIQUE: Informed written consent was obtained from the patient after a discussion of the risks, benefits and alternatives to treatment. A timeout was performed prior to the initiation of the procedure. Initial ultrasound scanning demonstrates a small left-sided pleural effusion. The lower chest was prepped and draped in the usual sterile fashion. 1% lidocaine was used for local anesthesia. An ultrasound image was saved for documentation purposes. An 8 Fr Safe-T-Centesis catheter was introduced. The thoracentesis was performed. The catheter was removed and a dressing was applied. The patient tolerated the procedure well without immediate post procedural complication. The patient was escorted to have an upright chest radiograph. FINDINGS: A total of approximately 250 cc of serous fluid was removed. IMPRESSION: Successful ultrasound-guided left sided thoracentesis yielding only 250 cc of pleural fluid. Electronically Signed   By: Sandi Mariscal M.D.   On: 03/09/2021 10:14   US THORACENTESIS ASP PLEURAL SPACE W/IMG GUIDE  Result Date: 03/08/2021 INDICATION: Patient with a history of metastatic breast cancer with recurrent bilateral pleural  effusions. Interventional radiology asked to perform therapeutic  thoracentesis. EXAM: ULTRASOUND GUIDED THORACENTESIS MEDICATIONS: 1% lidocaine 10 mL COMPLICATIONS: None immediate. PROCEDURE: An ultrasound guided thoracentesis was thoroughly discussed with the patient and questions answered. The benefits, risks, alternatives and complications were also discussed. The patient understands and wishes to proceed with the procedure. Written consent was obtained. Ultrasound was performed to localize and mark an adequate pocket of fluid in the right chest. The area was then prepped and draped in the normal sterile fashion. 1% Lidocaine was used for local anesthesia. Under ultrasound guidance a 6 Fr Safe-T-Centesis catheter was introduced. Thoracentesis was performed. The catheter was removed and a dressing applied. FINDINGS: A total of approximately 775 mL of amber-colored fluid was removed. IMPRESSION: Successful ultrasound guided right thoracentesis yielding 775 mL of pleural fluid. Read by: Soyla Dryer, NP Electronically Signed   By: Markus Daft M.D.   On: 03/08/2021 12:49   US THORACENTESIS ASP PLEURAL SPACE W/IMG GUIDE  Result Date: 02/28/2021 INDICATION: Patient with history of recurrent metastatic breast cancer with recurrentpleural effusion. Request is for therapeutic right-sided thoracentesis EXAM: ULTRASOUND GUIDED THERAPEUTIC THORACENTESIS MEDICATIONS: Lidocaine 1% 10 mL COMPLICATIONS: None immediate. PROCEDURE: An ultrasound guided thoracentesis was thoroughly discussed with the patient and questions answered. The benefits, risks, alternatives and complications were also discussed. The patient understands and wishes to proceed with the procedure. Written consent was obtained. Ultrasound was performed to localize and mark an adequate pocket of fluid in the right chest. The area was then prepped and draped in the normal sterile fashion. 1% Lidocaine was used for local anesthesia. Under ultrasound guidance a  6 Fr Safe-T-Centesis catheter was introduced. Thoracentesis was performed. The catheter was removed and a dressing applied. FINDINGS: A total of approximately 650 mL of straw-colored fluid was removed. IMPRESSION: Successful ultrasound guided right-sided therapeutic thoracentesis yielding 650 mL of pleural fluid. Read by: Rushie Nyhan, NP Electronically Signed   By: Corrie Mckusick D.O.   On: 02/28/2021 15:21   US THORACENTESIS ASP PLEURAL SPACE W/IMG GUIDE  Result Date: 02/23/2021 INDICATION: Metastatic breast cancer. Bilateral pleural effusions. Right thoracentesis performed on 02/21/2021. Patient returns today for left thoracentesis. EXAM: ULTRASOUND GUIDED LEFT THORACENTESIS MEDICATIONS: None. COMPLICATIONS: SIR Level A - No therapy, no consequence. PROCEDURE: An ultrasound guided thoracentesis was thoroughly discussed with the patient and questions answered. The benefits, risks, alternatives and complications were also discussed. The patient understands and wishes to proceed with the procedure. Written consent was obtained. Ultrasound was performed to localize and mark an adequate pocket of fluid in the left chest. The area was then prepped and draped in the normal sterile fashion. 1% Lidocaine was used for local anesthesia. Under ultrasound visualization a 19 gauge, 7-cm, Yueh catheter was introduced. Thoracentesis was performed. The catheter was removed and a dressing applied. FINDINGS: A total of approximately 110 mL of straw-colored fluid was removed. Samples were sent to the laboratory as requested by the clinical team. IMPRESSION: Successful ultrasound guided left thoracentesis yielding 110 mL of pleural fluid. Electronically Signed   By: Miachel Roux M.D.   On: 02/23/2021 10:52   US THORACENTESIS ASP PLEURAL SPACE W/IMG GUIDE  Result Date: 02/21/2021 INDICATION: Patient with history of recurrent metastatic breast cancer. Found to have pleural effusion. Request is for therapeutic and  diagnostic thoracentesis EXAM: ULTRASOUND GUIDED THERAPEUTIC AND DIAGNOSTIC THORACENTESIS MEDICATIONS: Lidocaine 1% 10 mL COMPLICATIONS: None immediate. PROCEDURE: An ultrasound guided thoracentesis was thoroughly discussed with the patient and questions answered. The benefits, risks, alternatives and complications were also discussed. The patient understands and wishes  to proceed with the procedure. Written consent was obtained. Ultrasound was performed to localize and mark an adequate pocket of fluid in the right chest. The area was then prepped and draped in the normal sterile fashion. 1% Lidocaine was used for local anesthesia. Under ultrasound guidance a 6 Fr Safe-T-Centesis catheter was introduced. Thoracentesis was performed. The catheter was removed and a dressing applied. FINDINGS: A total of approximately 550 mL of straw-colored fluid was removed. Samples were sent to the laboratory as requested by the clinical team. IMPRESSION: Successful ultrasound guided therapeutic and diagnostic right-sided thoracentesis yielding 550 mL of pleural fluid. Read by: Rushie Nyhan, NP Electronically Signed   By: Jerilynn Mages.  Shick M.D.   On: 02/21/2021 15:55     Assessment and plan- Patient is a 65 y.o. female with recurrent triple negative breast cancer here for on treatment assessment prior to cycle 1 of sacituzumab govitecan further goals of care discussion  Patient was seen for second opinion at [facility] and they also recommended proceeding with sacituzumab govitecan.  Patient is still unsure if she wants to proceed with this treatment or not. Sacituzumab govitecan is approved and metastatic triple negative breast cancer and is typically given 2 weeks on 1 week off until progression or toxicity.  Side effects include leg swelling, hair loss, electrolyte disturbances, nausea vomiting diarrhea low blood counts and possible risk of infusion reaction and abnormal LFTs.  Treatment will need to be given until progression or  toxicity.  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  Her ongoing symptoms are due to rapid progression of disease as well as prior chemotherapy.  However if we have to attempt any systemic treatment now this would be a better time to do so.  If we continue to wait longer the concern is for the progression of disease and declining performance status that would preclude any treatment in the future.  I have again tried to reassure the patient that if she were to have any side effects from chemotherapy we can see her in symptom management to reduce the risk of hospitalization.  However no treatment would be side effect free.  Even if we do not do sacituzumab govitecan other treatments were also chemo based treatments.  Patient would like to meet with palliative care NP Leota Jacobsen before deciding if she wants to do treatment or not.  She will therefore not be receiving treatment today and it has been 3 weeks since she has received treatment.  We also discussed about proceeding with best supportive care/hospice should she decide not to pursue any further chemotherapy.  Patient is unsure what she wants to do  Patient's albumin level is going down and I have encouraged her to improve her protein intake by drinking Ensure or boost.  Normocytic anemia overall stable  Bilateral pleural effusions: Initial plan was for Pleurx placement but patient decided to proceed with thoracentesis 2 days ago.  She will therefore not be receiving Pleurx this week.  Neoplasm related pain: We will increase her fentanyl patch dose to 25 mcg and she will continue as needed Dilaudid  Follow-up with me to be decided based on her visit with NP Altha Harm.   Visit Diagnosis 1. Encounter for antineoplastic chemotherapy   2. Metastatic breast cancer ([place])   3. Antineoplastic chemotherapy induced anemia   4. Goals of care, counseling/discussion      Dr. Randa Evens, MD, MPH [facility] at [facility] [phone] 03/10/2021 10:44  AM

## 2021-03-11 ENCOUNTER — Inpatient Hospital Stay (HOSPITAL_BASED_OUTPATIENT_CLINIC_OR_DEPARTMENT_OTHER): Payer: Medicare Other | Admitting: Hospice and Palliative Medicine

## 2021-03-11 ENCOUNTER — Ambulatory Visit: Admission: RE | Admit: 2021-03-11 | Payer: Medicare Other | Source: Ambulatory Visit

## 2021-03-11 VITALS — BP 126/55 | HR 87 | Temp 98.1°F | Resp 16

## 2021-03-11 DIAGNOSIS — Z9221 Personal history of antineoplastic chemotherapy: Secondary | ICD-10-CM | POA: Diagnosis not present

## 2021-03-11 DIAGNOSIS — C50919 Malignant neoplasm of unspecified site of unspecified female breast: Secondary | ICD-10-CM | POA: Diagnosis not present

## 2021-03-11 DIAGNOSIS — G893 Neoplasm related pain (acute) (chronic): Secondary | ICD-10-CM | POA: Diagnosis not present

## 2021-03-11 DIAGNOSIS — R5381 Other malaise: Secondary | ICD-10-CM | POA: Diagnosis not present

## 2021-03-11 DIAGNOSIS — Z171 Estrogen receptor negative status [ER-]: Secondary | ICD-10-CM | POA: Diagnosis not present

## 2021-03-11 DIAGNOSIS — C50511 Malignant neoplasm of lower-outer quadrant of right female breast: Secondary | ICD-10-CM | POA: Diagnosis not present

## 2021-03-11 DIAGNOSIS — Z803 Family history of malignant neoplasm of breast: Secondary | ICD-10-CM | POA: Diagnosis not present

## 2021-03-11 DIAGNOSIS — R221 Localized swelling, mass and lump, neck: Secondary | ICD-10-CM | POA: Diagnosis not present

## 2021-03-11 DIAGNOSIS — Z515 Encounter for palliative care: Secondary | ICD-10-CM

## 2021-03-11 DIAGNOSIS — C787 Secondary malignant neoplasm of liver and intrahepatic bile duct: Secondary | ICD-10-CM | POA: Diagnosis not present

## 2021-03-11 DIAGNOSIS — R5383 Other fatigue: Secondary | ICD-10-CM | POA: Diagnosis not present

## 2021-03-11 DIAGNOSIS — Z9013 Acquired absence of bilateral breasts and nipples: Secondary | ICD-10-CM | POA: Diagnosis not present

## 2021-03-11 DIAGNOSIS — Z800 Family history of malignant neoplasm of digestive organs: Secondary | ICD-10-CM | POA: Diagnosis not present

## 2021-03-11 DIAGNOSIS — Z79899 Other long term (current) drug therapy: Secondary | ICD-10-CM | POA: Diagnosis not present

## 2021-03-11 DIAGNOSIS — D6481 Anemia due to antineoplastic chemotherapy: Secondary | ICD-10-CM | POA: Diagnosis not present

## 2021-03-11 DIAGNOSIS — C773 Secondary and unspecified malignant neoplasm of axilla and upper limb lymph nodes: Secondary | ICD-10-CM | POA: Diagnosis not present

## 2021-03-11 DIAGNOSIS — T451X5A Adverse effect of antineoplastic and immunosuppressive drugs, initial encounter: Secondary | ICD-10-CM | POA: Diagnosis not present

## 2021-03-11 MED ORDER — FENTANYL 25 MCG/HR TD PT72: 1.0000 | MEDICATED_PATCH | TRANSDERMAL | 0 refills | Status: AC

## 2021-03-11 NOTE — Progress Notes (Signed)
She is here to speak to [facility] about chemotherapy, if she will decide to get new tx.

## 2021-03-11 NOTE — Progress Notes (Signed)
Debra Little  [phone] [phone]   Name: Debra Little Date: 03/11/2021 MRN: [id number]  DOB: 1956/05/25  Patient Care Team: Tonia Ghent, MD as PCP - General (Family Medicine) Bary Castilla, Forest Gleason, MD (General Surgery) Modesto Charon, MD (Family Medicine) Laneta Simmers as Physician Assistant (Urology) [name] Javier Docker, MD as Consulting Physician (Cardiology) Sindy Guadeloupe, MD as Consulting Physician (Hematology and Oncology) Noreene Filbert, MD as Referring Physician (Radiation Oncology)    REASON FOR CONSULTATION: Debra Little is a 65 y.o. 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  Patient has had bilateral malignant pleural effusions requiring thoracentesis. patient saw medical oncology at [facility] for second opinion with recommendation to proceed with sacituzumab govitecan.  Patient is unsure if she wants to proceed with treatment.  She was referred to palliative care to address goals.  SOCIAL HISTORY:     reports that she has never smoked. She has never used smokeless tobacco. She reports that she does not drink alcohol and does not use drugs.   Patient is married and lives at home with her husband.  They are recently married.  She has a son who lives nearby but says that he is not very involved.  She has some grandchildren.  Patient previously worked in Aeronautical engineer at [employer].  She has a [place].  ADVANCE DIRECTIVES:  None on file  CODE STATUS: DNR/DNI (DNR order signed on 03/11/2021)  PAST MEDICAL HISTORY: Past Medical History:  Diagnosis Date  . Anemia    d/t chemo - last Hgb 10.8  . Anxiety    sees Dr. [name]  . BRCA negative    [facility]ae panel neg except CHEK2 VUS  . Breast cancer ([place]) 2020  . Cyst of breast    per  [name]  . Dysrhythmia    Hx of palpitations  . Family history of breast cancer   . Family history of colon cancer   . Family history of leukemia   . Family history of ovarian cancer   . Fibromyalgia   . GERD (gastroesophageal reflux disease)   . Heart murmur   . Hemorrhoids   . Herpes, genital 04/2015   confirmed with HSV 2 IgG  . Hiatal hernia   . Hypercholesterolemia    Dr. Kyra Searles  . Hyperlipidemia   . IBS (irritable bowel syndrome)    constipation predominant  . IC (interstitial cystitis)    per [facility]  . Mild depression ([place])   . MVP (mitral valve prolapse)    [facility] cards eval 2012  . Osteopenia    2010/2017, DEXA at [facility];spine and fem neck  . PONV (postoperative nausea and vomiting)   . Vitamin D deficiency    history of    PAST SURGICAL HISTORY:  Past Surgical History:  Procedure Laterality Date  . ABDOMINAL HYSTERECTOMY    . APPENDECTOMY    . BLADDER SURGERY     1980's  . BREAST EXCISIONAL BIOPSY Right    benign  . BREAST EXCISIONAL BIOPSY Bilateral    benign  . COLONOSCOPY  09/2014   Dr. [name]  . COLONOSCOPY  2009   at [facility] 1 POLYP (BENIGN)  . excision of breast cysts     hx of multiple cyst aspirations  . EXPLORATORY LAPAROTOMY    . MASTECTOMY MODIFIED RADICAL Bilateral 10/09/2019  Procedure: RIGHT MASTECTOMY MODIFIED RADICAL AND LEFT TOTAL MASTECTOMY;  Surgeon: Rolm Bookbinder, MD;  Location: [facility];  Service: General;  Laterality: Bilateral;  . PORTA CATH INSERTION N/A 08/06/2019   Procedure: PORTA CATH INSERTION;  Surgeon: Katha Cabal, MD;  Location: [facility];  Service: Cardiovascular;  Laterality: N/A;  . PORTACATH PLACEMENT Left 08/05/2019   Procedure: INSERTION PORT-A-CATH, Attempted;  Surgeon: Jules Husbands, MD;  Location: [facility];  Service: General;  Laterality: Left;  . TUBAL LIGATION      HEMATOLOGY/ONCOLOGY HISTORY:  Oncology History  Breast cancer ([place])  07/30/2019 Initial Diagnosis   Breast cancer  ([place])   08/06/2019 Cancer Staging   Staging form: Breast, AJCC 8th Edition - Clinical stage from 08/06/2019: Stage IIIB (cT2, cN1, cM0, G3, ER-, PR-, HER2-) - Signed by Sindy Guadeloupe, MD on 08/07/2019   08/11/2019 - 03/02/2020 Chemotherapy   The patient had dexamethasone (DECADRON) 4 MG tablet, 1 of 1 cycle, Start date: 08/07/2019, End date: 04/20/2020 DOXOrubicin (ADRIAMYCIN) chemo injection 106 mg, 60 mg/m2 = 106 mg, Intravenous,  Once, 4 of 4 cycles Administration: 106 mg (08/11/2019), 106 mg (09/01/2019), 100 mg (09/22/2019), 100 mg (12/02/2019) palonosetron (ALOXI) injection 0.25 mg, 0.25 mg, Intravenous,  Once, 7 of 7 cycles Administration: 0.25 mg (08/11/2019), 0.25 mg (12/16/2019), 0.25 mg (01/06/2020), 0.25 mg (09/01/2019), 0.25 mg (12/23/2019), 0.25 mg (12/30/2019), 0.25 mg (09/22/2019), 0.25 mg (12/02/2019), 0.25 mg (01/20/2020), 0.25 mg (01/27/2020) pegfilgrastim-cbqv (UDENYCA) injection 6 mg, 6 mg, Subcutaneous, Once, 4 of 4 cycles Administration: 6 mg (08/12/2019), 6 mg (09/02/2019), 6 mg (09/23/2019), 6 mg (12/03/2019) cyclophosphamide (CYTOXAN) 1,060 mg in sodium chloride 0.9 % 250 mL chemo infusion, 600 mg/m2 = 1,060 mg, Intravenous,  Once, 4 of 4 cycles Administration: 1,000 mg (09/01/2019), 1,000 mg (09/22/2019), 1,000 mg (12/02/2019) PACLitaxel (TAXOL) 138 mg in sodium chloride 0.9 % 250 mL chemo infusion (</= 72m/m2), 80 mg/m2 = 138 mg, Intravenous,  Once, 4 of 4 cycles Dose modification: 65 mg/m2 (original dose 80 mg/m2, Cycle 6, Reason: Provider Judgment) Administration: 138 mg (12/16/2019), 138 mg (12/23/2019), 114 mg (01/13/2020), 138 mg (12/30/2019), 114 mg (02/03/2020), 114 mg (01/20/2020), 114 mg (01/27/2020), 114 mg (02/10/2020), 114 mg (02/17/2020), 114 mg (02/24/2020), 114 mg (03/02/2020) pembrolizumab (KEYTRUDA) 200 mg in sodium chloride 0.9 % 50 mL chemo infusion, 200 mg (100 % of original dose 200 mg), Intravenous, Once, 3 of 3 cycles Dose modification: 200 mg (original dose 200 mg, Cycle 1, Reason: Provider  Judgment) Administration: 200 mg (08/11/2019), 200 mg (09/01/2019), 200 mg (09/22/2019) fosaprepitant (EMEND) 150 mg, dexamethasone (DECADRON) 12 mg in sodium chloride 0.9 % 145 mL IVPB, , Intravenous,  Once, 4 of 4 cycles Administration:  (08/11/2019),  (09/01/2019),  (09/22/2019),  (12/02/2019)  for chemotherapy treatment.    08/26/2019 Genetic Testing   Negative genetic testing. VUS in CHEK2 called c.556A>C identified on the [employer] Common Hereditary Cancers Panel. The Common Hereditary Cancers Panel offered by [facility] includes sequencing and/or deletion duplication testing of the following 48 genes: APC, ATM, AXIN2, BARD1, BMPR1A, BRCA1, BRCA2, BRIP1, CDH1, CDKN2A (p14ARF), CDKN2A (p16INK4a), CKD4, CHEK2, CTNNA1, DICER1, EPCAM (Deletion/duplication testing only), GREM1 (promoter region deletion/duplication testing only), KIT, MEN1, MLH1, MSH2, MSH3, MSH6, MUTYH, NBN, NF1, NHTL1, PALB2, PDGFRA, PMS2, POLD1, POLE, PTEN, RAD50, RAD51C, RAD51D, RNF43, SDHB, SDHC, SDHD, SMAD4, SMARCA4. STK11, TP53, TSC1, TSC2, and VHL.  The following genes were evaluated for sequence changes only: SDHA and HOXB13 c.251G>A variant only. The report date is 08/26/2019.    10/21/2019 Cancer  Staging   Staging form: Breast, AJCC 8th Edition - Pathologic stage from 10/21/2019: Stage IIIC (pT2, pN2a, cM0, G3, ER-, PR-, HER2-) - Signed by Sindy Guadeloupe, MD on 10/27/2019   10/12/2020 - 02/15/2021 Chemotherapy         02/24/2021 -  Chemotherapy    Patient is on Treatment Plan: BLADDER SACITUZUMAB GOVITECAN-HZIY ([place]) Q21D        ALLERGIES:  is allergic to amitiza [lubiprostone], bentyl [dicyclomine hcl], cortisone, mobic [meloxicam], nitrofurantoin monohyd macro, sulfonamide derivatives, zoloft [sertraline hcl], buspar [buspirone], celecoxib, doxycycline, and penicillins.  MEDICATIONS:  Current Outpatient Medications  Medication Sig Dispense Refill  . acetaminophen (TYLENOL) 650 MG CR tablet Take 650 mg by mouth every  8 (eight) hours as needed for pain.    [name] ascorbic acid (VITAMIN C) 500 MG tablet Take 500 mg by mouth daily.    [name] atorvastatin (LIPITOR) 40 MG tablet Take 40 mg by mouth at bedtime.   1  . Calcium Carbonate-Vit D-Min (CALCIUM 1200 PO) Take 4 tablets by mouth daily.    . cholecalciferol (VITAMIN D3) 25 MCG (1000 UNIT) tablet Take 1 tablet (1,000 Units total) by mouth daily.    . clonazePAM (KLONOPIN) 0.5 MG tablet Take 0.5 mg by mouth 2 (two) times daily.     [name] dexamethasone (DECADRON) 4 MG tablet Take 2 tablets (8 mg) daily for 3 days after chemotherapy. Take with food. 30 tablet 1  . DEXILANT 30 MG capsule TAKE 1 CAPSULE BY MOUTH ONCE DAILY 30 capsule 6  . DULoxetine (CYMBALTA) 60 MG capsule Take 60 mg by mouth daily.     . fentaNYL (DURAGESIC) 12 MCG/HR Place 1 patch onto the skin every 3 (three) days. 10 patch 0  . furosemide (LASIX) 20 MG tablet Take 1 tablet (20 mg total) by mouth every other day. (Patient not taking: No sig reported)    . gabapentin (NEURONTIN) 100 MG capsule Take 100 mg by mouth at bedtime.    [name] HYDROmorphone HCl (DILAUDID) 1 MG/ML LIQD Take 0.5 mLs (0.5 mg total) by mouth every 4 (four) hours as needed for severe pain. 90 mL 0  . lactulose (CHRONULAC) 10 GM/15ML solution Take 45 mLs (30 g total) by mouth 2 (two) times daily. 236 mL 1  . loperamide (IMODIUM A-D) 2 MG tablet Take 2 at diarrhea onset, then 1 every 2hr until 12hrs with no BM. May take 2 every 4hrs at night. If diarrhea recurs repeat. (Patient not taking: No sig reported) 100 tablet 1  . loratadine (CLARITIN) 10 MG tablet Take 1 tablet (10 mg total) by mouth daily.    . NON FORMULARY Take 5 mLs by mouth 4 (four) times daily as needed. Swish and swallow 5-10 ml of Medicated Mouthwash 4 x per day as needed (Patient not taking: No sig reported)    . OLANZapine (ZYPREXA) 10 MG tablet Take 1 tablet (10 mg total) by mouth at bedtime. For nausea or vomiting (Patient not taking: No sig reported) 30 tablet 0  .  Plecanatide (TRULANCE) 3 MG TABS Take 3 mg by mouth daily. 30 tablet 11  . polyethylene glycol (MIRALAX / GLYCOLAX) 17 g packet Take 17 g by mouth daily.    . pregabalin (LYRICA) 75 MG capsule Take 1 capsule (75 mg total) by mouth 2 (two) times daily. (Patient not taking: No sig reported) 60 capsule 2  . sennosides-docusate sodium (SENOKOT-S) 8.6-50 MG tablet Take 1-2 tablets by mouth at bedtime.    . sucralfate (CARAFATE)  1 g tablet Take 1 g by mouth 3 (three) times daily. (Patient not taking: No sig reported)    . triamcinolone (KENALOG) 0.1 % Apply 1 application topically 2 (two) times daily as needed. For itching (Patient not taking: No sig reported) 80 g 0  . valACYclovir (VALTREX) 500 MG tablet Take 1 tablet (500 mg total) by mouth daily. (Patient not taking: Reported on 03/10/2021) 90 tablet 3  . vitamin B-12 (CYANOCOBALAMIN) 500 MCG tablet Take 500 mcg by mouth daily.     No current facility-administered medications for this visit.    VITAL SIGNS: There were no vitals taken for this visit. There were no vitals filed for this visit.  Estimated body mass index is 25.58 kg/m as calculated from the following:   Height as of 02/24/21: _0  (1.702 m).   Weight as of 03/10/21: 163 lb 4.8 oz (74.1 kg).  LABS: CBC:    Component Value Date/Time   WBC 9.4 03/10/2021 0831   HGB 9.6 (L) 03/10/2021 0831   HCT 31.1 (L) 03/10/2021 0831   PLT 315 03/10/2021 0831   MCV 86.4 03/10/2021 0831   NEUTROABS 7.2 03/10/2021 0831   LYMPHSABS 0.6 (L) 03/10/2021 0831   MONOABS 1.4 (H) 03/10/2021 0831   EOSABS 0.1 03/10/2021 0831   BASOSABS 0.1 03/10/2021 0831   Comprehensive Metabolic Panel:    Component Value Date/Time   NA 136 03/10/2021 0831   K 4.1 03/10/2021 0831   CL 104 03/10/2021 0831   CO2 21 (L) 03/10/2021 0831   BUN 15 03/10/2021 0831   CREATININE 0.87 03/10/2021 0831   CREATININE 0.88 06/29/2017 0000   GLUCOSE 139 (H) 03/10/2021 0831   CALCIUM 8.5 (L) 03/10/2021 0831   AST 29  03/10/2021 0831   AST 13 06/29/2017 0000   ALT 19 03/10/2021 0831   ALT 10 06/29/2017 0000   ALKPHOS 84 03/10/2021 0831   BILITOT 0.3 03/10/2021 0831   PROT 6.8 03/10/2021 0831   ALBUMIN 2.8 (L) 03/10/2021 0831    RADIOGRAPHIC STUDIES: DG Chest 1 View  Result Date: 02/23/2021 CLINICAL DATA:  Status post left thoracentesis EXAM: CHEST  1 VIEW COMPARISON:  02/21/2021 FINDINGS: Heart size is within normal limits. Small pneumothorax noted along the medial apex of the left chest. Interval decrease of left pleural effusion. No significant right pleural effusion and fide on radiograph. Bilateral parenchymal opacities again seen. IMPRESSION: Small left medial apical pneumothorax status post left thoracentesis. Electronically Signed   By: Miachel Roux M.D.   On: 02/23/2021 09:31   CT CHEST ABDOMEN PELVIS W CONTRAST  Result Date: 02/16/2021 CLINICAL DATA:  History of metastatic breast cancer. Increased swelling in the right upper chest area. EXAM: CT CHEST, ABDOMEN, AND PELVIS WITH CONTRAST TECHNIQUE: Multidetector CT imaging of the chest, abdomen and pelvis was performed following the standard protocol during bolus administration of intravenous contrast. CONTRAST:  191m OMNIPAQUE IOHEXOL 300 MG/ML  SOLN COMPARISON:  CT scan 07/07/2020 and PET-CT 12/09/2020 FINDINGS: CT CHEST FINDINGS Cardiovascular: Heart is normal in size. No pericardial effusion. The aorta is normal in caliber. No dissection. The branch vessels are patent. No definite coronary artery calcifications. Mediastinum/Nodes: New mediastinal and hilar lymphadenopathy. Some of the adenopathy appears slightly necrotic. 14 mm precarinal lymph node on image 21/2. This previously measured 6 mm. 10 mm right hilar node on image 23/2 11.5 mm right-sided subcarinal node on image number 29/2 Smaller bilateral hilar lymph nodes are also new and appears somewhat necrotic. There is moderate contrast in  the esophagus which could suggest reflux. Lungs/Pleura:  New bilateral pleural effusions with overlying atelectasis. 15 mm subpleural density in the right upper lobe anteriorly has vessels entering in it and could be an area of rounded atelectasis. Other smaller pleural or subpleural lesions somewhat worrisome for pleural mets. A right-sided thoracentesis may be helpful. Do not see any definite metastatic pulmonary nodules. Patchy ground-glass opacity could suggest edema or inflammation. Stable apical scarring changes. Musculoskeletal: There is a rim enhancing low-attenuation lesion in the right axilla located between surgical clips. This was also noted on the prior PET-CT and was hypermetabolic. Findings consistent with necrotic tumor. This measures a maximum of 2.8 x 2.5 cm. It previously measured approximately 18 x 17 mm. Irregular enhancing lesion involves the right chest wall anteriorly near the sternum. This measures a maximum of 25 mm image 23/2. This previously measured 18.5 mm. Slightly more inferiorly and medially there is a rim enhancing necrotic appearing mass measuring 3.3 x 2.3 cm. This is causing erosion of the right aspect of the sternum and likely originated in the bone. It previously measured 2.7 x 2.0 cm. Stable elongated fluid collection involving the right chest wall likely a postoperative seroma. I do not see any lytic or sclerotic vertebral body lesions to suggest metastatic disease. The ribs appear intact. CT ABDOMEN PELVIS FINDINGS Hepatobiliary: No hepatic lesions to suggest metastatic disease. No intrahepatic biliary dilatation. The gallbladder is unremarkable. No common bile duct dilatation. Pancreas: No mass, inflammation or ductal dilatation. Spleen: Normal size. Few small splenic lesions are stable and likely benign. Adrenals/Urinary Tract: Adrenal glands are normal. No renal lesions or hydronephrosis. Bladder is unremarkable. Stomach/Bowel: The stomach, duodenum, small bowel and colon are unremarkable. No acute inflammatory changes, mass  lesions or obstructive findings. Vascular/Lymphatic: Stable scattered vascular calcifications. No aneurysm or dissection. The branch vessels are patent. Borderline enlarged celiac axis and periportal lymph nodes are noted. No retroperitoneal lymphadenopathy. No pelvic lymphadenopathy. Reproductive: Surgically absent. Other: No pelvic mass or adenopathy. No free pelvic fluid collections. No inguinal mass or adenopathy. No abdominal wall hernia or subcutaneous lesions. Musculoskeletal: No lytic or sclerotic bone lesions involving the lumbar spine or pelvis are identified. IMPRESSION: 1. New and progressive mediastinal and hilar lymphadenopathy. Some of the adenopathy appears slightly necrotic. 2. Slightly larger rim enhancing necrotic mass in the right axilla. 3. Two enlarging adjacent necrotic appearing lesions involving the right chest wall. The larger lesion is causing erosion of the right aspect of the sternum (or originated in the sternum). 4. New bilateral pleural effusions with overlying atelectasis. 5. 15 mm subpleural density in the right upper lobe anteriorly has vessels entering in it and could be an area of rounded atelectasis. Other smaller pleural or subpleural lesions somewhat worrisome for pleural mets. A right-sided thoracentesis may be helpful. 6. Borderline enlarged celiac axis and periportal lymph nodes. No other findings for metastatic disease involving the abdomen/pelvis. 7. Stable elongated fluid collection involving the right chest wall, likely a postoperative seroma. Electronically Signed   By: Marijo Sanes M.D.   On: 02/16/2021 14:39   DG Chest Port 1 View  Result Date: 03/09/2021 CLINICAL DATA:  History of breast cancer with symptomatic bilateral pleural effusions. Post left-sided thoracentesis. EXAM: PORTABLE CHEST 1 VIEW COMPARISON:  Chest radiograph-03/08/2021; ultrasound-guided left-sided thoracentesis-02/23/2021 (yielding only 110 mL of pleural fluid) FINDINGS: Grossly unchanged  cardiac silhouette and mediastinal contours. Stable positioning of support apparatus. Slight reduction in persistent trace potentially partially loculated left-sided effusion post thoracentesis. No pneumothorax. Improved aeration  of the left lung base. Unchanged trace right-sided pleural effusion and associated right basilar opacities. Redemonstrated mild pulmonary venous congestion without frank evidence of edema. No new focal airspace opacities. Sequela of previous bilateral mastectomies and axillary adenectomy. No acute osseous abnormalities. IMPRESSION: 1. Slight reduction in persistent trace potentially partially loculated left-sided effusion post thoracentesis. No pneumothorax. 2. Unchanged trace right-sided pleural effusion. 3. Similar findings of pulmonary venous congestion without frank evidence of edema. Electronically Signed   By: Sandi Mariscal M.D.   On: 03/09/2021 09:57   DG Chest Port 1 View  Result Date: 03/08/2021 CLINICAL DATA:  S/P thoracentesis EXAM: PORTABLE CHEST - 1 VIEW COMPARISON:  02/28/2021 FINDINGS: The mediastinal contours are within normal limits. No cardiomegaly. Left internal jugular approach Port-A-Cath in place with the tip at the cavoatrial junction, unchanged. Trace residual right pleural effusion after thoracentesis. Similar appearing right middle lobe linear atelectasis. The left costophrenic angle remains blunted with left basilar subsegmental atelectasis. Bilateral axillary and left breast surgical clips in place. No acute osseous abnormality. IMPRESSION: 1. No complicating features after right thoracentesis. 2. Similar appearing right middle and left basilar subsegmental atelectasis with trace left pleural effusion. Ruthann Cancer, MD [facility] Electronically Signed   By: Ruthann Cancer MD   On: 03/08/2021 12:15   DG Chest Port 1 View  Result Date: 02/28/2021 CLINICAL DATA:  65 year old female with a history of  pleural effusion status post thoracentesis EXAM: PORTABLE CHEST 1 VIEW COMPARISON:  02/23/2021 FINDINGS: Cardiomediastinal silhouette unchanged in size and contour. Left chest wall port catheter via IJ approach, unchanged. Opacity at the left lung base persists, with partial obscuration left hemidiaphragm and blunting of the costophrenic angle. No significant residual right-sided pleural effusion. No pneumothorax. Similar appearance of reticulonodular opacities of the lungs in this patient with known breast carcinoma. IMPRESSION: No complicating features status post thoracentesis, with small residual left-sided pleural effusion. Electronically Signed   By: Corrie Mckusick D.O.   On: 02/28/2021 15:00   DG Chest Port 1 View  Result Date: 02/23/2021 CLINICAL DATA:  Follow-up left pneumothorax EXAM: PORTABLE CHEST 1 VIEW COMPARISON:  02/23/2021 FINDINGS: Cardiomediastinal silhouette and pulmonary vasculature are within normal limits. Left chest port again seen. Small left pleural effusion again identified. Previously seen left pneumothorax no longer identified. IMPRESSION: No residual left pneumothorax. Electronically Signed   By: Miachel Roux M.D.   On: 02/23/2021 10:50   DG Chest Port 1 View  Result Date: 02/21/2021 CLINICAL DATA:  Status post thoracentesis. Metastatic breast cancer. EXAM: PORTABLE CHEST 1 VIEW COMPARISON:  CT chest dated February 16, 2021. FINDINGS: Decreased now trace right pleural effusion status post thoracentesis. No pneumothorax. Unchanged small left pleural effusion and mild left basilar atelectasis. No consolidation. Unchanged left chest wall port catheter. Stable cardiomediastinal silhouette. No acute osseous abnormality. IMPRESSION: 1. Decreased now trace right pleural effusion status post thoracentesis. No pneumothorax. 2. Unchanged small left pleural effusion. Electronically Signed   By: Titus Dubin M.D.   On: 02/21/2021 16:33   US THORACENTESIS ASP PLEURAL SPACE W/IMG  GUIDE  Result Date: 03/09/2021 INDICATION: History of metastatic breast cancer with recurrent bilateral pleural effusions. Patient underwent ultrasound-guided right sided thoracentesis yesterday returns today for ultrasound-guided left-sided thoracentesis. EXAM: US THORACENTESIS ASP PLEURAL SPACE W/IMG GUIDE COMPARISON:  Left-sided thoracentesis-02/23/2021 (yielding only 110 cc of pleural fluid) Chest radiograph-03/08/2021 MEDICATIONS: None. COMPLICATIONS: None immediate. TECHNIQUE: Informed written consent was obtained from the patient after a discussion of the risks, benefits and  alternatives to treatment. A timeout was performed prior to the initiation of the procedure. Initial ultrasound scanning demonstrates a small left-sided pleural effusion. The lower chest was prepped and draped in the usual sterile fashion. 1% lidocaine was used for local anesthesia. An ultrasound image was saved for documentation purposes. An 8 Fr Safe-T-Centesis catheter was introduced. The thoracentesis was performed. The catheter was removed and a dressing was applied. The patient tolerated the procedure well without immediate post procedural complication. The patient was escorted to have an upright chest radiograph. FINDINGS: A total of approximately 250 cc of serous fluid was removed. IMPRESSION: Successful ultrasound-guided left sided thoracentesis yielding only 250 cc of pleural fluid. Electronically Signed   By: Sandi Mariscal M.D.   On: 03/09/2021 10:14   US THORACENTESIS ASP PLEURAL SPACE W/IMG GUIDE  Result Date: 03/08/2021 INDICATION: Patient with a history of metastatic breast cancer with recurrent bilateral pleural effusions. Interventional radiology asked to perform therapeutic thoracentesis. EXAM: ULTRASOUND GUIDED THORACENTESIS MEDICATIONS: 1% lidocaine 10 mL COMPLICATIONS: None immediate. PROCEDURE: An ultrasound guided thoracentesis was thoroughly discussed with the patient and questions answered. The benefits,  risks, alternatives and complications were also discussed. The patient understands and wishes to proceed with the procedure. Written consent was obtained. Ultrasound was performed to localize and mark an adequate pocket of fluid in the right chest. The area was then prepped and draped in the normal sterile fashion. 1% Lidocaine was used for local anesthesia. Under ultrasound guidance a 6 Fr Safe-T-Centesis catheter was introduced. Thoracentesis was performed. The catheter was removed and a dressing applied. FINDINGS: A total of approximately 775 mL of amber-colored fluid was removed. IMPRESSION: Successful ultrasound guided right thoracentesis yielding 775 mL of pleural fluid. Read by: Soyla Dryer, NP Electronically Signed   By: Markus Daft M.D.   On: 03/08/2021 12:49   US THORACENTESIS ASP PLEURAL SPACE W/IMG GUIDE  Result Date: 02/28/2021 INDICATION: Patient with history of recurrent metastatic breast cancer with recurrentpleural effusion. Request is for therapeutic right-sided thoracentesis EXAM: ULTRASOUND GUIDED THERAPEUTIC THORACENTESIS MEDICATIONS: Lidocaine 1% 10 mL COMPLICATIONS: None immediate. PROCEDURE: An ultrasound guided thoracentesis was thoroughly discussed with the patient and questions answered. The benefits, risks, alternatives and complications were also discussed. The patient understands and wishes to proceed with the procedure. Written consent was obtained. Ultrasound was performed to localize and mark an adequate pocket of fluid in the right chest. The area was then prepped and draped in the normal sterile fashion. 1% Lidocaine was used for local anesthesia. Under ultrasound guidance a 6 Fr Safe-T-Centesis catheter was introduced. Thoracentesis was performed. The catheter was removed and a dressing applied. FINDINGS: A total of approximately 650 mL of straw-colored fluid was removed. IMPRESSION: Successful ultrasound guided right-sided therapeutic thoracentesis yielding 650 mL of  pleural fluid. Read by: Rushie Nyhan, NP Electronically Signed   By: Corrie Mckusick D.O.   On: 02/28/2021 15:21   US THORACENTESIS ASP PLEURAL SPACE W/IMG GUIDE  Result Date: 02/23/2021 INDICATION: Metastatic breast cancer. Bilateral pleural effusions. Right thoracentesis performed on 02/21/2021. Patient returns today for left thoracentesis. EXAM: ULTRASOUND GUIDED LEFT THORACENTESIS MEDICATIONS: None. COMPLICATIONS: SIR Level A - No therapy, no consequence. PROCEDURE: An ultrasound guided thoracentesis was thoroughly discussed with the patient and questions answered. The benefits, risks, alternatives and complications were also discussed. The patient understands and wishes to proceed with the procedure. Written consent was obtained. Ultrasound was performed to localize and mark an adequate pocket of fluid in the left chest. The area was then prepped  and draped in the normal sterile fashion. 1% Lidocaine was used for local anesthesia. Under ultrasound visualization a 19 gauge, 7-cm, Yueh catheter was introduced. Thoracentesis was performed. The catheter was removed and a dressing applied. FINDINGS: A total of approximately 110 mL of straw-colored fluid was removed. Samples were sent to the laboratory as requested by the clinical team. IMPRESSION: Successful ultrasound guided left thoracentesis yielding 110 mL of pleural fluid. Electronically Signed   By: Miachel Roux M.D.   On: 02/23/2021 10:52   US THORACENTESIS ASP PLEURAL SPACE W/IMG GUIDE  Result Date: 02/21/2021 INDICATION: Patient with history of recurrent metastatic breast cancer. Found to have pleural effusion. Request is for therapeutic and diagnostic thoracentesis EXAM: ULTRASOUND GUIDED THERAPEUTIC AND DIAGNOSTIC THORACENTESIS MEDICATIONS: Lidocaine 1% 10 mL COMPLICATIONS: None immediate. PROCEDURE: An ultrasound guided thoracentesis was thoroughly discussed with the patient and questions answered. The benefits, risks, alternatives and  complications were also discussed. The patient understands and wishes to proceed with the procedure. Written consent was obtained. Ultrasound was performed to localize and mark an adequate pocket of fluid in the right chest. The area was then prepped and draped in the normal sterile fashion. 1% Lidocaine was used for local anesthesia. Under ultrasound guidance a 6 Fr Safe-T-Centesis catheter was introduced. Thoracentesis was performed. The catheter was removed and a dressing applied. FINDINGS: A total of approximately 550 mL of straw-colored fluid was removed. Samples were sent to the laboratory as requested by the clinical team. IMPRESSION: Successful ultrasound guided therapeutic and diagnostic right-sided thoracentesis yielding 550 mL of pleural fluid. Read by: Rushie Nyhan, NP Electronically Signed   By: Jerilynn Mages.  Shick M.D.   On: 02/21/2021 15:55    PERFORMANCE STATUS (ECOG) : 2 - Symptomatic, <50% confined to bed  Review of Systems Unless otherwise noted, a complete review of systems is negative.  Physical Exam General: NAD Pulmonary: Unlabored Extremities: Lymphedema right upper extremity, no joint deformities Skin: no rashes Neurological: Weakness but otherwise nonfocal  IMPRESSION: I met with patient and husband today in the clinic.  Introduced palliative care services and attempted to establish therapeutic rapport.  We spoke extensively about her goals related to future cancer treatment.  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  There are future lines of treatment but response rates are minimal and would likely only prolong her life by me or months.  Patient verbalized that she thought she was leaning away from further treatment.  She is understandably fearful of a symptom burden with more chemotherapy, which could negatively impact her quality of life.  We also discussed  the option in detail for hospice involvement at home.  She has a cousin who is a Merchandiser, retail locally.  Symptomatically, patient has pain in the right arm and shoulder from extensive lymphedema.  Her transdermal fentanyl patch was recently increased by Dr. Janese Banks to 25 mcg every 72 hours.  Patient had been previously instructed to use two 12 mcg fentanyl patches to equal this dose.  Patient has yet to increase the fentanyl.  We will send a new Rx for 25 mcg dose.  Patient says that she rarely takes that hydromorphone for breakthrough pain.  She does take acetaminophen/ibuprofen 3 times daily.  Patient has progressive fatigue likely secondary to her tumor burden.  She also endorses chronic dyspnea due to recurrent malignant pleural effusions.  We discussed option for Pleurx catheter today but the patient is unsure  if she wants another "tube" or procedure.  I would recommend as needed thoracentesis for therapeutic relief.  Patient often has to sleep in a recliner due to dyspnea.  I suggested that she might have some benefit from a hospital bed but she wants to think about this.  She was in agreement to a wheelchair, which I will order.  We discussed CODE STATUS.  Patient stated clearly that she would not want to be resuscitated or have her life prolonged artificially machines.  Her husband was in agreement with this decision.  I signed a DNR order for her to take home.     Durable Medical Equipment  (From admission, onward)         Start     Ordered   03/11/21 1548  For home use only DME lightweight manual wheelchair with seat cushion  Once       Comments: Patient suffers from stage IV breast cancer which impairs their ability to perform daily activities like dressing, feeding, grooming and toileting in the home.  A cane, crutch or walker will not resolve  issue with performing activities of daily living. A wheelchair will allow patient to safely perform daily activities. Patient is not able to  propel themselves in the home using a standard weight wheelchair due to endurance and general weakness. Patient can self propel in the lightweight wheelchair. Length of need Lifetime. Accessories: elevating leg rests (ELRs), wheel locks, extensions and anti-tippers.   03/11/21 1548           PLAN: -Patient still considering option for hospice versus more treatment.  We will plan to follow-up with her next week virtually -DME: Lightweight wheelchair -Fentanyl 25 mcg every 72 hours, #10 patches -Thoracentesis as needed -Follow-up MyChart visit next week  Case and plan discussed with Dr. Janese Banks  Patient expressed understanding and was in agreement with this plan. She also understands that She can call the clinic at any time with any questions, concerns, or complaints.     Time Total: 60 minutes  Visit consisted of counseling and education dealing with the complex and emotionally intense issues of symptom management and palliative care in the setting of serious and potentially life-threatening illness.Greater than 50%  of this time was spent counseling and coordinating care related to the above assessment and plan.  Signed by: Altha Harm, PhD, NP-C

## 2021-03-14 ENCOUNTER — Other Ambulatory Visit
Admission: RE | Admit: 2021-03-14 | Discharge: 2021-03-14 | Disposition: A | Discharge status: Home / Self Care | Payer: Medicare Other | Source: Ambulatory Visit | Attending: Oncology | Admitting: Oncology

## 2021-03-14 ENCOUNTER — Telehealth: Payer: Self-pay | Admitting: *Deleted

## 2021-03-14 ENCOUNTER — Other Ambulatory Visit: Payer: Self-pay

## 2021-03-14 ENCOUNTER — Other Ambulatory Visit: Payer: Self-pay | Admitting: *Deleted

## 2021-03-14 DIAGNOSIS — Z20822 Contact with and (suspected) exposure to covid-19: Secondary | ICD-10-CM | POA: Diagnosis not present

## 2021-03-14 DIAGNOSIS — C50919 Malignant neoplasm of unspecified site of unspecified female breast: Secondary | ICD-10-CM

## 2021-03-14 DIAGNOSIS — Z01812 Encounter for preprocedural laboratory examination: Secondary | ICD-10-CM | POA: Insufficient documentation

## 2021-03-14 DIAGNOSIS — J90 Pleural effusion, not elsewhere classified: Secondary | ICD-10-CM

## 2021-03-14 NOTE — Telephone Encounter (Signed)
Patient called stating that she cannot breathe for past 3 days and is having to sit up to sleep. She is requesting to have right sided Thoracentesis TODAY and to be scheduled for Pleur ex cath  Placement Friday.

## 2021-03-14 NOTE — Telephone Encounter (Signed)
I have placed an order for thoracentesis and filled out form. Lauren to speak to pt about pleurex catheter.

## 2021-03-15 ENCOUNTER — Other Ambulatory Visit: Payer: Self-pay | Admitting: Interventional Radiology

## 2021-03-15 ENCOUNTER — Ambulatory Visit
Admission: RE | Admit: 2021-03-15 | Discharge: 2021-03-15 | Disposition: A | Discharge status: Home / Self Care | Payer: Medicare Other | Source: Ambulatory Visit | Attending: Interventional Radiology | Admitting: Interventional Radiology

## 2021-03-15 ENCOUNTER — Ambulatory Visit
Admission: RE | Admit: 2021-03-15 | Discharge: 2021-03-15 | Disposition: A | Discharge status: Home / Self Care | Payer: Medicare Other | Source: Ambulatory Visit | Attending: Oncology | Admitting: Oncology

## 2021-03-15 ENCOUNTER — Other Ambulatory Visit: Payer: Self-pay | Admitting: *Deleted

## 2021-03-15 ENCOUNTER — Inpatient Hospital Stay (HOSPITAL_BASED_OUTPATIENT_CLINIC_OR_DEPARTMENT_OTHER): Payer: Medicare Other | Admitting: Hospice and Palliative Medicine

## 2021-03-15 DIAGNOSIS — J90 Pleural effusion, not elsewhere classified: Secondary | ICD-10-CM

## 2021-03-15 DIAGNOSIS — Z9889 Other specified postprocedural states: Secondary | ICD-10-CM

## 2021-03-15 DIAGNOSIS — C50919 Malignant neoplasm of unspecified site of unspecified female breast: Secondary | ICD-10-CM | POA: Diagnosis not present

## 2021-03-15 DIAGNOSIS — Z515 Encounter for palliative care: Secondary | ICD-10-CM | POA: Diagnosis not present

## 2021-03-15 DIAGNOSIS — J910 Malignant pleural effusion: Secondary | ICD-10-CM | POA: Insufficient documentation

## 2021-03-15 DIAGNOSIS — C50911 Malignant neoplasm of unspecified site of right female breast: Secondary | ICD-10-CM | POA: Diagnosis not present

## 2021-03-15 DIAGNOSIS — J9811 Atelectasis: Secondary | ICD-10-CM | POA: Diagnosis not present

## 2021-03-15 DIAGNOSIS — C50912 Malignant neoplasm of unspecified site of left female breast: Secondary | ICD-10-CM | POA: Diagnosis not present

## 2021-03-15 LAB — SARS CORONAVIRUS 2 (TAT 6-24 HRS): SARS Coronavirus 2: NEGATIVE

## 2021-03-15 NOTE — Progress Notes (Signed)
Virtual Visit via Telephone Note  I connected with Debra Little on 03/15/21 at 10:30 AM EDT by telephone and verified that I am speaking with the correct person using two identifiers.  Location: Patient: home Provider: clinic   I discussed the limitations, risks, security and privacy concerns of performing an evaluation and management service by telephone and the availability of in person appointments. I also discussed with the patient that there may be a patient responsible charge related to this service. The patient expressed understanding and agreed to proceed.   History of Present Illness: Debra Little is a 65 y.o. 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  Patient has had bilateral malignant pleural effusions requiring thoracentesis. patient saw medical oncology at [facility] for second opinion with recommendation to proceed with sacituzumab govitecan.  Patient is unsure if she wants to proceed with treatment.  She was referred to palliative care to address goals.   Observations/Objective: I spoke with patient by phone today.  She reports worsening dyspnea likely secondary to recurrent malignant pleural effusion.  Patient is scheduled for thoracentesis today.  Pleurx has been previously recommended but scheduling has been difficult as patient does not wish to wait.  We will reach out to [facility] and [facility] to see what options are available.  Patient says that she removed the transdermal fentanyl patch as she felt too drowsy.  She says pain is otherwise stable.  Patient says that she does not "see how" that she could pursue further cancer treatment.  She says that she needs help at home given her declining status.  We again discussed the option of hospice involvement at home and she verbalized agreement.  We will pursue hospice referral.  Assessment and  Plan: Triple negative metaplastic breast cancer -overall clinically declining with poor prognosis and limited treatment options.  Patient in agreement for hospice involvement.  Recurrent malignant pleural effusion -patient scheduled for thoracentesis today.  Would recommend Pleurx when able.  Anxiety -okay to increase Klonopin 1 mg twice daily  Pain -continue hydromorphone elixir 0.5 to 1 mg every 4 hours as needed  Follow Up Instructions: As needed  Case and plan discussed with Dr. Janese Banks   I discussed the assessment and treatment plan with the patient. The patient was provided an opportunity to ask questions and all were answered. The patient agreed with the plan and demonstrated an understanding of the instructions.   The patient was advised to call back or seek an in-person evaluation if the symptoms worsen or if the condition fails to improve as anticipated.  I provided 15 minutes of non-face-to-face time during this encounter.   [name], NP

## 2021-03-16 ENCOUNTER — Other Ambulatory Visit: Payer: Self-pay | Admitting: Interventional Radiology

## 2021-03-16 ENCOUNTER — Other Ambulatory Visit: Payer: Self-pay

## 2021-03-16 ENCOUNTER — Ambulatory Visit
Admission: RE | Admit: 2021-03-16 | Discharge: 2021-03-16 | Disposition: A | Discharge status: Home / Self Care | Payer: Medicare Other | Source: Ambulatory Visit | Attending: Interventional Radiology | Admitting: Interventional Radiology

## 2021-03-16 ENCOUNTER — Ambulatory Visit
Admission: RE | Admit: 2021-03-16 | Discharge: 2021-03-16 | Disposition: A | Discharge status: Home / Self Care | Payer: Medicare Other | Source: Ambulatory Visit | Attending: Oncology | Admitting: Oncology

## 2021-03-16 DIAGNOSIS — J910 Malignant pleural effusion: Secondary | ICD-10-CM | POA: Diagnosis not present

## 2021-03-16 DIAGNOSIS — J90 Pleural effusion, not elsewhere classified: Secondary | ICD-10-CM

## 2021-03-16 DIAGNOSIS — J9811 Atelectasis: Secondary | ICD-10-CM | POA: Insufficient documentation

## 2021-03-16 DIAGNOSIS — Z9889 Other specified postprocedural states: Secondary | ICD-10-CM

## 2021-03-16 DIAGNOSIS — C50919 Malignant neoplasm of unspecified site of unspecified female breast: Secondary | ICD-10-CM

## 2021-03-16 DIAGNOSIS — C50912 Malignant neoplasm of unspecified site of left female breast: Secondary | ICD-10-CM | POA: Diagnosis not present

## 2021-03-16 DIAGNOSIS — C50911 Malignant neoplasm of unspecified site of right female breast: Secondary | ICD-10-CM | POA: Diagnosis not present

## 2021-03-16 NOTE — Telephone Encounter (Signed)
Pt was called earlier in the day and she was told already about the date and time and then I told her about getting covid test Thursday am in order to get pleurex catheter on Friday. She is ok with this

## 2021-03-16 NOTE — Progress Notes (Signed)
Patient here today for thora on left,husband with patient. Placed on schedule for Pleurx catheter placement 03/18/2021. Made aware to be here @ 0930, NPO after MN prior to procedure, and driver post procedure/recovery. Stated understanding.

## 2021-03-17 ENCOUNTER — Telehealth: Payer: Self-pay | Admitting: *Deleted

## 2021-03-17 ENCOUNTER — Other Ambulatory Visit: Payer: Self-pay | Admitting: Radiology

## 2021-03-17 ENCOUNTER — Other Ambulatory Visit
Admission: RE | Admit: 2021-03-17 | Discharge: 2021-03-17 | Disposition: A | Discharge status: Home / Self Care | Payer: Medicare Other | Source: Ambulatory Visit | Attending: Oncology | Admitting: Oncology

## 2021-03-17 DIAGNOSIS — Z20822 Contact with and (suspected) exposure to covid-19: Secondary | ICD-10-CM | POA: Diagnosis not present

## 2021-03-17 DIAGNOSIS — Z01812 Encounter for preprocedural laboratory examination: Secondary | ICD-10-CM | POA: Diagnosis not present

## 2021-03-17 LAB — SARS CORONAVIRUS 2 (TAT 6-24 HRS): SARS Coronavirus 2: NEGATIVE

## 2021-03-17 NOTE — Telephone Encounter (Signed)
I let deidra know that pt is suppose to get pleurex catheter placed 4/22. I know that Josh had called hospice with approval from pt. To start care after she gets pleurex. deidra said that they have her set up for Friday but if she is going to have pleurex then they will start her case on Saturday because once it starts anything that happens that day will be absorbed into hospice services. deidra said that they will let her know

## 2021-03-18 ENCOUNTER — Ambulatory Visit
Admission: RE | Admit: 2021-03-18 | Discharge: 2021-03-18 | Disposition: A | Discharge status: Home / Self Care | Payer: Medicare Other | Source: Ambulatory Visit | Attending: Interventional Radiology | Admitting: Interventional Radiology

## 2021-03-18 ENCOUNTER — Other Ambulatory Visit: Payer: Self-pay

## 2021-03-18 ENCOUNTER — Ambulatory Visit
Admission: RE | Admit: 2021-03-18 | Discharge: 2021-03-18 | Disposition: A | Discharge status: Home / Self Care | Payer: Medicare Other | Source: Ambulatory Visit | Attending: Oncology | Admitting: Oncology

## 2021-03-18 ENCOUNTER — Other Ambulatory Visit: Payer: Self-pay | Admitting: Nurse Practitioner

## 2021-03-18 DIAGNOSIS — R0902 Hypoxemia: Secondary | ICD-10-CM

## 2021-03-18 DIAGNOSIS — J9811 Atelectasis: Secondary | ICD-10-CM | POA: Diagnosis not present

## 2021-03-18 DIAGNOSIS — Z79899 Other long term (current) drug therapy: Secondary | ICD-10-CM | POA: Diagnosis not present

## 2021-03-18 DIAGNOSIS — Z880 Allergy status to penicillin: Secondary | ICD-10-CM | POA: Diagnosis not present

## 2021-03-18 DIAGNOSIS — C50911 Malignant neoplasm of unspecified site of right female breast: Secondary | ICD-10-CM

## 2021-03-18 DIAGNOSIS — C50912 Malignant neoplasm of unspecified site of left female breast: Secondary | ICD-10-CM | POA: Diagnosis not present

## 2021-03-18 DIAGNOSIS — Z882 Allergy status to sulfonamides status: Secondary | ICD-10-CM | POA: Diagnosis not present

## 2021-03-18 DIAGNOSIS — J90 Pleural effusion, not elsewhere classified: Secondary | ICD-10-CM

## 2021-03-18 DIAGNOSIS — Z888 Allergy status to other drugs, medicaments and biological substances status: Secondary | ICD-10-CM | POA: Diagnosis not present

## 2021-03-18 DIAGNOSIS — J910 Malignant pleural effusion: Secondary | ICD-10-CM | POA: Diagnosis not present

## 2021-03-18 DIAGNOSIS — C773 Secondary and unspecified malignant neoplasm of axilla and upper limb lymph nodes: Secondary | ICD-10-CM | POA: Insufficient documentation

## 2021-03-18 DIAGNOSIS — R911 Solitary pulmonary nodule: Secondary | ICD-10-CM | POA: Diagnosis not present

## 2021-03-18 MED ORDER — FENTANYL CITRATE (PF) 100 MCG/2ML IJ SOLN
INTRAMUSCULAR | Status: AC
  Filled 2021-03-18: qty 2

## 2021-03-18 MED ORDER — ONDANSETRON HCL 4 MG/2ML IJ SOLN
INTRAMUSCULAR | Status: AC
  Filled 2021-03-18: qty 2

## 2021-03-18 MED ORDER — VANCOMYCIN HCL IN DEXTROSE 1-5 GM/200ML-% IV SOLN
1000.0000 mg | Freq: Once | INTRAVENOUS | Status: DC
  Filled 2021-03-18: qty 200

## 2021-03-18 MED ORDER — VANCOMYCIN HCL 500 MG/100ML IV SOLN
INTRAVENOUS | Status: AC | PRN
  Administered 2021-03-18: 1000 mg via INTRAVENOUS

## 2021-03-18 MED ORDER — FENTANYL CITRATE (PF) 100 MCG/2ML IJ SOLN
INTRAMUSCULAR | Status: AC | PRN
  Administered 2021-03-18 (×2): 50 ug via INTRAVENOUS

## 2021-03-18 MED ORDER — MIDAZOLAM HCL 2 MG/2ML IJ SOLN
INTRAMUSCULAR | Status: AC | PRN
  Administered 2021-03-18 (×2): 1 mg via INTRAVENOUS

## 2021-03-18 MED ORDER — SODIUM CHLORIDE 0.9 % IV SOLN: INTRAVENOUS | Status: DC

## 2021-03-18 MED ORDER — MIDAZOLAM HCL 2 MG/2ML IJ SOLN
INTRAMUSCULAR | Status: AC
  Filled 2021-03-18: qty 2

## 2021-03-18 MED ORDER — ONDANSETRON HCL 4 MG/2ML IJ SOLN
INTRAMUSCULAR | Status: AC | PRN
  Administered 2021-03-18: 4 mg via INTRAVENOUS

## 2021-03-18 MED ORDER — SODIUM CHLORIDE 0.9 % IV SOLN
8.0000 mg | Freq: Once | INTRAVENOUS | Status: DC
  Filled 2021-03-18: qty 4

## 2021-03-18 NOTE — Progress Notes (Signed)
CxR seen by MD. Cancer center RN Melissa brought oxygen tank for pt. To go home with. Pt. Stable to be DC'd home on O2 at 1L/[place]. Pleurix site clean, dry, intact without hematoma, edema, drainage. DC instructions reviewed with pt. & spouse with verbalized understanding. Pt. To be seen by Hospice tomorrow at 1 PM.

## 2021-03-18 NOTE — H&P (Signed)
Chief Complaint: Patient was seen in consultation today for recurrent right pleural effusion  Referring Physician(s): Belmore C  Supervising Physician: Aletta Edouard  Patient Status: [facility] - Out-pt  History of Present Illness: Debra Little is a 65 y.o. female with past medical history of triple negative metaplastic right breast cancer s/p bilateral mastectomy, XRT, and several rounds of chemotherapy with disease progression.  Patient also with bilateral recurrent pleural effusions, right greater than left requiring weekly right-sided thoracentesis.  She is planning to enter hospice care and has decided to proceed with PleurX catheter placement for symptom management and comfort.  Debra Little presents today accompanied by her husband.  Although anxious for procedure today is agreeable to proceed.  Hospice is aware of her needs and plans to visit tomorrow. She is otherwise in her usual state of health today without fever, chills, vomiting, abdominal pain, cough, shortness of breath.  She endorses nausea and does have swelling in the right arm with ongoing edema and drainage.  Her husband reports changing bandages several times daily.  She has been NPO.   Past Medical History:  Diagnosis Date  . Anemia    d/t chemo - last Hgb 10.8  . Anxiety    sees Dr. [name]  . BRCA negative    [employer] panel neg except CHEK2 VUS  . Breast cancer ([place]) 2020  . Cyst of breast    per [name]  . Dysrhythmia    Hx of palpitations  . Family history of breast cancer   . Family history of colon cancer   . Family history of leukemia   . Family history of ovarian cancer   . Fibromyalgia   . GERD (gastroesophageal reflux disease)   . Heart murmur   . Hemorrhoids   . Herpes, genital 04/2015   confirmed with HSV 2 IgG  . Hiatal hernia   . Hypercholesterolemia    Dr. Kyra Searles  . Hyperlipidemia   . IBS (irritable bowel syndrome)    constipation predominant  . IC (interstitial  cystitis)    per [facility]  . Mild depression ([place])   . MVP (mitral valve prolapse)    [facility] cards eval 2012  . Osteopenia    2010/2017, DEXA at [facility];spine and fem neck  . PONV (postoperative nausea and vomiting)   . Vitamin D deficiency    history of    Past Surgical History:  Procedure Laterality Date  . ABDOMINAL HYSTERECTOMY    . APPENDECTOMY    . BLADDER SURGERY     1980's  . BREAST EXCISIONAL BIOPSY Right    benign  . BREAST EXCISIONAL BIOPSY Bilateral    benign  . COLONOSCOPY  09/2014   Dr. [name]  . COLONOSCOPY  2009   at [facility] 1 POLYP (BENIGN)  . excision of breast cysts     hx of multiple cyst aspirations  . EXPLORATORY LAPAROTOMY    . MASTECTOMY MODIFIED RADICAL Bilateral 10/09/2019   Procedure: RIGHT MASTECTOMY MODIFIED RADICAL AND LEFT TOTAL MASTECTOMY;  Surgeon: Rolm Bookbinder, MD;  Location: [place];  Service: General;  Laterality: Bilateral;  . PORTA CATH INSERTION N/A 08/06/2019   Procedure: PORTA CATH INSERTION;  Surgeon: Katha Cabal, MD;  Location: [place] [facility];  Service: Cardiovascular;  Laterality: N/A;  . PORTACATH PLACEMENT Left 08/05/2019   Procedure: INSERTION PORT-A-CATH, Attempted;  Surgeon: Jules Husbands, MD;  Location: [facility];  Service: General;  Laterality: Left;  . TUBAL LIGATION  Allergies: Amitiza [lubiprostone], Bentyl [dicyclomine hcl], Cortisone, Mobic [meloxicam], Nitrofurantoin monohyd macro, Sulfonamide derivatives, Zoloft [sertraline hcl], Buspar [buspirone], Celecoxib, Doxycycline, and Penicillins  Medications: Prior to Admission medications   Medication Sig Start Date End Date Taking? Authorizing Provider  acetaminophen (TYLENOL) 650 MG CR tablet Take 650 mg by mouth every 8 (eight) hours as needed for pain.   Yes [provider]  atorvastatin (LIPITOR) 40 MG tablet Take 40 mg by mouth at bedtime.  01/16/17  Yes [provider]  Calcium Carbonate-Vit D-Min (CALCIUM 1200 PO) Take 4  tablets by mouth daily.   Yes [provider]  cholecalciferol (VITAMIN D3) 25 MCG (1000 UNIT) tablet Take 1 tablet (1,000 Units total) by mouth daily. 04/21/20  Yes Tonia Ghent, MD  clonazePAM (KLONOPIN) 0.5 MG tablet Take 0.5 mg by mouth 2 (two) times daily.  01/30/17  Yes [provider]  DEXILANT 30 MG capsule TAKE 1 CAPSULE BY MOUTH ONCE DAILY 02/21/21  Yes Lucilla Lame, MD  DULoxetine (CYMBALTA) 60 MG capsule Take 60 mg by mouth daily.    Yes [provider]  gabapentin (NEURONTIN) 100 MG capsule Take 100 mg by mouth at bedtime.   Yes [provider]  HYDROmorphone HCl (DILAUDID) 1 MG/ML LIQD Take 0.5 mLs (0.5 mg total) by mouth every 4 (four) hours as needed for severe pain. 10/08/20  Yes Sindy Guadeloupe, MD  loratadine (CLARITIN) 10 MG tablet Take 1 tablet (10 mg total) by mouth daily. 04/10/14  Yes Tonia Ghent, MD  sennosides-docusate sodium (SENOKOT-S) 8.6-50 MG tablet Take 1-2 tablets by mouth at bedtime.   Yes [provider]  vitamin B-12 (CYANOCOBALAMIN) 500 MCG tablet Take 500 mcg by mouth daily.   Yes [provider]  ascorbic acid (VITAMIN C) 500 MG tablet Take 500 mg by mouth daily.    [provider]  dexamethasone (DECADRON) 4 MG tablet Take 2 tablets (8 mg) daily for 3 days after chemotherapy. Take with food. Patient not taking: No sig reported 02/18/21   Sindy Guadeloupe, MD  fentaNYL (DURAGESIC) 25 MCG/HR Place 1 patch onto the skin every 3 (three) days. Patient not taking: Reported on 03/18/2021 03/11/21   Borders, Kirt Boys, NP  furosemide (LASIX) 20 MG tablet Take 1 tablet (20 mg total) by mouth every other day. Patient not taking: No sig reported 04/20/20   Tonia Ghent, MD  lactulose (CHRONULAC) 10 GM/15ML solution Take 45 mLs (30 g total) by mouth 2 (two) times daily. Patient not taking: Reported on 03/18/2021 10/14/20   Verlon Au, NP  loperamide (IMODIUM A-D) 2 MG tablet Take 2 at diarrhea onset,  then 1 every 2hr until 12hrs with no BM. May take 2 every 4hrs at night. If diarrhea recurs repeat. Patient not taking: No sig reported 02/18/21   Sindy Guadeloupe, MD  NON FORMULARY Take 5 mLs by mouth 4 (four) times daily as needed. Swish and swallow 5-10 ml of Medicated Mouthwash 4 x per day as needed Patient not taking: No sig reported    [provider]  OLANZapine (ZYPREXA) 10 MG tablet Take 1 tablet (10 mg total) by mouth at bedtime. For nausea or vomiting Patient not taking: No sig reported 10/15/20   Verlon Au, NP  Plecanatide (TRULANCE) 3 MG TABS Take 3 mg by mouth daily. Patient not taking: No sig reported 11/11/20   Lucilla Lame, MD  polyethylene glycol (MIRALAX / GLYCOLAX) 17 g packet Take 17 g by mouth  daily.    [provider]  pregabalin (LYRICA) 75 MG capsule Take 1 capsule (75 mg total) by mouth 2 (two) times daily. Patient not taking: No sig reported 11/23/20   Sindy Guadeloupe, MD  sucralfate (CARAFATE) 1 g tablet Take 1 g by mouth 3 (three) times daily. Patient not taking: No sig reported 11/12/19   [provider]  triamcinolone (KENALOG) 0.1 % Apply 1 application topically 2 (two) times daily as needed. For itching Patient not taking: Reported on 03/11/2021 11/09/20   Verlon Au, NP  valACYclovir (VALTREX) 500 MG tablet Take 1 tablet (500 mg total) by mouth daily. Patient not taking: No sig reported 67/20/94   Copland, Alicia B, PA-C  prochlorperazine (COMPAZINE) 10 MG tablet Take 1 tablet (10 mg total) by mouth every 6 (six) hours as needed (Nausea or vomiting). Patient not taking: Reported on 02/15/2021 10/12/20 02/18/21  Sindy Guadeloupe, MD     Family History  Problem Relation Age of Onset  . Breast cancer Mother 51  . Hypertension Mother   . Colon cancer Father 20  . Hyperlipidemia Father   . Ovarian cancer Maternal Aunt 80  . Breast cancer Cousin   . Gallbladder disease Paternal Grandmother   . Liver cancer Cousin 70        Malignant  . Cancer Maternal Uncle        unsure type    Social History   Socioeconomic History  . Marital status: Married    Spouse name: Lennette Bihari  . Number of children: 1  . Years of education: Not on file  . Highest education level: Not on file  Occupational History  . Occupation: [employer]  Tobacco Use  . Smoking status: Never Smoker  . Smokeless tobacco: Never Used  Vaping Use  . Vaping Use: Never used  Substance and Sexual Activity  . Alcohol use: No    Alcohol/week: 0.0 standard drinks  . Drug use: No  . Sexual activity: Yes    Birth control/protection: Surgical    Comment: Hysterectomy  Other Topics Concern  . Not on file  Social History Narrative   Married in February 13, 1997, divorced as of 02/13/17, 1 son from previous relationship   Brother with MVA at 72, in rest home for many years as of 02/13/2018- died of covid recently   Social Determinants of Radio broadcast assistant Strain: [place]   . Difficulty of Paying Living Expenses: Not hard at all  Food Insecurity: No Food Insecurity  . Worried About Charity fundraiser in the Last Year: Never true  . Ran Out of Food in the Last Year: Never true  Transportation Needs: No Transportation Needs  . Lack of Transportation (Medical): No  . Lack of Transportation (Non-Medical): No  Physical Activity: Inactive  . Days of Exercise per Week: 0 days  . Minutes of Exercise per Session: 0 min  Stress: No Stress Concern Present  . Feeling of Stress : Not at all  Social Connections: Not on file     Review of Systems: A 12 point ROS discussed and pertinent positives are indicated in the HPI above.  All other systems are negative.  Review of Systems  Constitutional: Negative for fatigue and fever.  Respiratory: Positive for shortness of breath (with fluid). Negative for cough.   Cardiovascular: Negative for chest pain.  Gastrointestinal: Positive for nausea. Negative for abdominal pain and vomiting.  Genitourinary: Negative for dysuria.   Musculoskeletal: Negative for back pain.  Psychiatric/Behavioral: Negative for behavioral problems and confusion.    Vital Signs: BP (!) 112/58   Pulse (!) 113   Temp 98.6 F (37 C) (Oral)   Resp 20   Ht $R'5\' 7"'tG$  (1.702 m)   Wt 155 lb (70.3 kg)   SpO2 93%   BMI 24.28 kg/m   Physical Exam Vitals and nursing note reviewed.  Constitutional:      General: She is not in acute distress.    Appearance: Normal appearance. She is not ill-appearing.  HENT:     Mouth/Throat:     Mouth: Mucous membranes are moist.     Pharynx: Oropharynx is clear.  Cardiovascular:     Rate and Rhythm: Regular rhythm.  Pulmonary:     Effort: Pulmonary effort is normal. No respiratory distress.     Breath sounds: Normal breath sounds.  Abdominal:     General: Abdomen is flat.     Palpations: Abdomen is soft.  Skin:    General: Skin is warm and dry.  Neurological:     General: No focal deficit present.     Mental Status: She is alert and oriented to person, place, and time. Mental status is at baseline.  Psychiatric:        Mood and Affect: Mood normal.        Behavior: Behavior normal.        Thought Content: Thought content normal.        Judgment: Judgment normal.      MD Evaluation Airway: WNL Heart: WNL Abdomen: WNL Chest/ Lungs: WNL ASA  Classification: 3 Mallampati/Airway Score: Two   Imaging: DG Chest 1 View  Result Date: 02/23/2021 CLINICAL DATA:  Status post left thoracentesis EXAM: CHEST  1 VIEW COMPARISON:  02/21/2021 FINDINGS: Heart size is within normal limits. Small pneumothorax noted along the medial apex of the left chest. Interval decrease of left pleural effusion. No significant right pleural effusion and fide on radiograph. Bilateral parenchymal opacities again seen. IMPRESSION: Small left medial apical pneumothorax status post left thoracentesis. Electronically Signed   By: Miachel Roux M.D.   On: 02/23/2021 09:31   CT CHEST ABDOMEN PELVIS W CONTRAST  Result Date:  02/16/2021 CLINICAL DATA:  History of metastatic breast cancer. Increased swelling in the right upper chest area. EXAM: CT CHEST, ABDOMEN, AND PELVIS WITH CONTRAST TECHNIQUE: Multidetector CT imaging of the chest, abdomen and pelvis was performed following the standard protocol during bolus administration of intravenous contrast. CONTRAST:  147mL OMNIPAQUE IOHEXOL 300 MG/ML  SOLN COMPARISON:  CT scan 07/07/2020 and PET-CT 12/09/2020 FINDINGS: CT CHEST FINDINGS Cardiovascular: Heart is normal in size. No pericardial effusion. The aorta is normal in caliber. No dissection. The branch vessels are patent. No definite coronary artery calcifications. Mediastinum/Nodes: New mediastinal and hilar lymphadenopathy. Some of the adenopathy appears slightly necrotic. 14 mm precarinal lymph node on image 21/2. This previously measured 6 mm. 10 mm right hilar node on image 23/2 11.5 mm right-sided subcarinal node on image number 29/2 Smaller bilateral hilar lymph nodes are also new and appears somewhat necrotic. There is moderate contrast in the esophagus which could suggest reflux. Lungs/Pleura: New bilateral pleural effusions with overlying atelectasis. 15 mm subpleural density in the right upper lobe anteriorly has vessels entering in it and could be an area of rounded atelectasis. Other smaller pleural or subpleural lesions somewhat worrisome for pleural mets. A right-sided thoracentesis may be helpful. Do not see any definite metastatic pulmonary nodules. Patchy ground-glass opacity could suggest edema or  inflammation. Stable apical scarring changes. Musculoskeletal: There is a rim enhancing low-attenuation lesion in the right axilla located between surgical clips. This was also noted on the prior PET-CT and was hypermetabolic. Findings consistent with necrotic tumor. This measures a maximum of 2.8 x 2.5 cm. It previously measured approximately 18 x 17 mm. Irregular enhancing lesion involves the right chest wall anteriorly  near the sternum. This measures a maximum of 25 mm image 23/2. This previously measured 18.5 mm. Slightly more inferiorly and medially there is a rim enhancing necrotic appearing mass measuring 3.3 x 2.3 cm. This is causing erosion of the right aspect of the sternum and likely originated in the bone. It previously measured 2.7 x 2.0 cm. Stable elongated fluid collection involving the right chest wall likely a postoperative seroma. I do not see any lytic or sclerotic vertebral body lesions to suggest metastatic disease. The ribs appear intact. CT ABDOMEN PELVIS FINDINGS Hepatobiliary: No hepatic lesions to suggest metastatic disease. No intrahepatic biliary dilatation. The gallbladder is unremarkable. No common bile duct dilatation. Pancreas: No mass, inflammation or ductal dilatation. Spleen: Normal size. Few small splenic lesions are stable and likely benign. Adrenals/Urinary Tract: Adrenal glands are normal. No renal lesions or hydronephrosis. Bladder is unremarkable. Stomach/Bowel: The stomach, duodenum, small bowel and colon are unremarkable. No acute inflammatory changes, mass lesions or obstructive findings. Vascular/Lymphatic: Stable scattered vascular calcifications. No aneurysm or dissection. The branch vessels are patent. Borderline enlarged celiac axis and periportal lymph nodes are noted. No retroperitoneal lymphadenopathy. No pelvic lymphadenopathy. Reproductive: Surgically absent. Other: No pelvic mass or adenopathy. No free pelvic fluid collections. No inguinal mass or adenopathy. No abdominal wall hernia or subcutaneous lesions. Musculoskeletal: No lytic or sclerotic bone lesions involving the lumbar spine or pelvis are identified. IMPRESSION: 1. New and progressive mediastinal and hilar lymphadenopathy. Some of the adenopathy appears slightly necrotic. 2. Slightly larger rim enhancing necrotic mass in the right axilla. 3. Two enlarging adjacent necrotic appearing lesions involving the right chest  wall. The larger lesion is causing erosion of the right aspect of the sternum (or originated in the sternum). 4. New bilateral pleural effusions with overlying atelectasis. 5. 15 mm subpleural density in the right upper lobe anteriorly has vessels entering in it and could be an area of rounded atelectasis. Other smaller pleural or subpleural lesions somewhat worrisome for pleural mets. A right-sided thoracentesis may be helpful. 6. Borderline enlarged celiac axis and periportal lymph nodes. No other findings for metastatic disease involving the abdomen/pelvis. 7. Stable elongated fluid collection involving the right chest wall, likely a postoperative seroma. Electronically Signed   By: Marijo Sanes M.D.   On: 02/16/2021 14:39   DG Chest Port 1 View  Result Date: 03/16/2021 CLINICAL DATA:  Recurrent malignant pleural effusions secondary to metastatic breast carcinoma and status post right-sided thoracentesis yesterday and left-sided thoracentesis today. EXAM: PORTABLE CHEST 1 VIEW COMPARISON:  03/15/2021 FINDINGS: The heart size and mediastinal contours are within normal limits. Stable positioning of Port-A-Cath. Decrease in left pleural fluid after thoracentesis with no evidence of pneumothorax. There has been some partial reaccumulation of right-sided pleural fluid since thoracentesis yesterday. There also is increased atelectasis in the right lower lung. No pneumothorax identified on the right. The visualized skeletal structures are unremarkable. IMPRESSION: Partial reaccumulation of right pleural fluid after thoracentesis yesterday with no evidence of pneumothorax. Increased atelectasis in the right lower lung. Decrease in left pleural fluid with no evidence of left pneumothorax after left-sided thoracentesis today. Electronically Signed  By: Aletta Edouard M.D.   On: 03/16/2021 16:16   DG Chest Port 1 View  Result Date: 03/15/2021 CLINICAL DATA:  Status post right thoracentesis. EXAM: PORTABLE CHEST 1  VIEW COMPARISON:  Single-view of the chest 03/09/2021. FINDINGS: Small right pleural effusion is decreased after thoracentesis. No pneumothorax. Small left pleural effusion and basilar airspace disease are unchanged. Heart size is upper normal. Basilar atelectasis and pulmonary vascular congestion noted. Port-A-Cath is unchanged. IMPRESSION: Small right pleural effusion is decreased after thoracentesis. No pneumothorax or other new abnormality. Electronically Signed   By: Inge Rise M.D.   On: 03/15/2021 14:39   DG Chest Port 1 View  Result Date: 03/09/2021 CLINICAL DATA:  History of breast cancer with symptomatic bilateral pleural effusions. Post left-sided thoracentesis. EXAM: PORTABLE CHEST 1 VIEW COMPARISON:  Chest radiograph-03/08/2021; ultrasound-guided left-sided thoracentesis-02/23/2021 (yielding only 110 mL of pleural fluid) FINDINGS: Grossly unchanged cardiac silhouette and mediastinal contours. Stable positioning of support apparatus. Slight reduction in persistent trace potentially partially loculated left-sided effusion post thoracentesis. No pneumothorax. Improved aeration of the left lung base. Unchanged trace right-sided pleural effusion and associated right basilar opacities. Redemonstrated mild pulmonary venous congestion without frank evidence of edema. No new focal airspace opacities. Sequela of previous bilateral mastectomies and axillary adenectomy. No acute osseous abnormalities. IMPRESSION: 1. Slight reduction in persistent trace potentially partially loculated left-sided effusion post thoracentesis. No pneumothorax. 2. Unchanged trace right-sided pleural effusion. 3. Similar findings of pulmonary venous congestion without frank evidence of edema. Electronically Signed   By: Sandi Mariscal M.D.   On: 03/09/2021 09:57   DG Chest Port 1 View  Result Date: 03/08/2021 CLINICAL DATA:  S/P thoracentesis EXAM: PORTABLE CHEST - 1 VIEW COMPARISON:  02/28/2021 FINDINGS: The mediastinal  contours are within normal limits. No cardiomegaly. Left internal jugular approach Port-A-Cath in place with the tip at the cavoatrial junction, unchanged. Trace residual right pleural effusion after thoracentesis. Similar appearing right middle lobe linear atelectasis. The left costophrenic angle remains blunted with left basilar subsegmental atelectasis. Bilateral axillary and left breast surgical clips in place. No acute osseous abnormality. IMPRESSION: 1. No complicating features after right thoracentesis. 2. Similar appearing right middle and left basilar subsegmental atelectasis with trace left pleural effusion. Ruthann Cancer, MD [facility] Electronically Signed   By: Ruthann Cancer MD   On: 03/08/2021 12:15   DG Chest Port 1 View  Result Date: 02/28/2021 CLINICAL DATA:  65 year old female with a history of pleural effusion status post thoracentesis EXAM: PORTABLE CHEST 1 VIEW COMPARISON:  02/23/2021 FINDINGS: Cardiomediastinal silhouette unchanged in size and contour. Left chest wall port catheter via IJ approach, unchanged. Opacity at the left lung base persists, with partial obscuration left hemidiaphragm and blunting of the costophrenic angle. No significant residual right-sided pleural effusion. No pneumothorax. Similar appearance of reticulonodular opacities of the lungs in this patient with known breast carcinoma. IMPRESSION: No complicating features status post thoracentesis, with small residual left-sided pleural effusion. Electronically Signed   By: Corrie Mckusick D.O.   On: 02/28/2021 15:00   DG Chest Port 1 View  Result Date: 02/23/2021 CLINICAL DATA:  Follow-up left pneumothorax EXAM: PORTABLE CHEST 1 VIEW COMPARISON:  02/23/2021 FINDINGS: Cardiomediastinal silhouette and pulmonary vasculature are within normal limits. Left chest port again seen. Small left pleural effusion again identified. Previously seen left pneumothorax no longer  identified. IMPRESSION: No residual left pneumothorax. Electronically Signed   By: Miachel Roux M.D.   On: 02/23/2021 10:50   DG  Chest Port 1 View  Result Date: 02/21/2021 CLINICAL DATA:  Status post thoracentesis. Metastatic breast cancer. EXAM: PORTABLE CHEST 1 VIEW COMPARISON:  CT chest dated February 16, 2021. FINDINGS: Decreased now trace right pleural effusion status post thoracentesis. No pneumothorax. Unchanged small left pleural effusion and mild left basilar atelectasis. No consolidation. Unchanged left chest wall port catheter. Stable cardiomediastinal silhouette. No acute osseous abnormality. IMPRESSION: 1. Decreased now trace right pleural effusion status post thoracentesis. No pneumothorax. 2. Unchanged small left pleural effusion. Electronically Signed   By: Titus Dubin M.D.   On: 02/21/2021 16:33   US THORACENTESIS ASP PLEURAL SPACE W/IMG GUIDE  Result Date: 03/16/2021 CLINICAL DATA:  Recurrent bilateral malignant pleural effusions secondary to metastatic breast carcinoma, right greater than left. Status post right thoracentesis yesterday. EXAM: ULTRASOUND GUIDED LEFT THORACENTESIS COMPARISON:  None. PROCEDURE: An ultrasound guided thoracentesis was thoroughly discussed with the patient and questions answered. The benefits, risks, alternatives and complications were also discussed. The patient understands and wishes to proceed with the procedure. Written consent was obtained. A time-out was performed prior to initiating the procedure. Ultrasound was performed to localize and mark an adequate pocket of fluid in the left chest. The area was then prepped and draped in the normal sterile fashion. 1% Lidocaine was used for local anesthesia. Under ultrasound guidance a 6 French Safe-T-Centesis catheter was introduced. Thoracentesis was performed. The catheter was removed and a dressing applied. COMPLICATIONS: None FINDINGS: A total of approximately 400 mL of yellow fluid was removed. Postprocedure  ultrasound demonstrates no significant residual pleural fluid remaining. IMPRESSION: Successful ultrasound guided left thoracentesis yielding 400 mL of pleural fluid. Electronically Signed   By: Aletta Edouard M.D.   On: 03/16/2021 16:25   US THORACENTESIS ASP PLEURAL SPACE W/IMG GUIDE  Result Date: 03/15/2021 CLINICAL DATA:  Recurrent bilateral malignant pleural effusions secondary to metastatic breast carcinoma. EXAM: ULTRASOUND GUIDED RIGHT THORACENTESIS COMPARISON:  03/08/2021 PROCEDURE: An ultrasound guided thoracentesis was thoroughly discussed with the patient and questions answered. The benefits, risks, alternatives and complications were also discussed. The patient understands and wishes to proceed with the procedure. Written consent was obtained. Bilateral pleural effusions were localized by ultrasound with volume estimated from a posterior approach. Ultrasound was performed to localize and mark an adequate pocket of fluid in the right chest. The area was then prepped and draped in the normal sterile fashion. 1% Lidocaine was used for local anesthesia. Under ultrasound guidance a 6 French Safe-T-Centesis catheter was introduced. Thoracentesis was performed. The catheter was removed and a dressing applied. COMPLICATIONS: None FINDINGS: Ultrasound demonstrates bilateral pleural effusions, right greater than left. A total of approximately 1.2 L of clear, yellow-green fluid was removed from the right pleural space. IMPRESSION: Successful ultrasound guided right thoracentesis yielding 1.2 L of pleural fluid. Electronically Signed   By: Aletta Edouard M.D.   On: 03/15/2021 14:15   US THORACENTESIS ASP PLEURAL SPACE W/IMG GUIDE  Result Date: 03/09/2021 INDICATION: History of metastatic breast cancer with recurrent bilateral pleural effusions. Patient underwent ultrasound-guided right sided thoracentesis yesterday returns today for ultrasound-guided left-sided thoracentesis. EXAM: US THORACENTESIS ASP  PLEURAL SPACE W/IMG GUIDE COMPARISON:  Left-sided thoracentesis-02/23/2021 (yielding only 110 cc of pleural fluid) Chest radiograph-03/08/2021 MEDICATIONS: None. COMPLICATIONS: None immediate. TECHNIQUE: Informed written consent was obtained from the patient after a discussion of the risks, benefits and alternatives to treatment. A timeout was performed prior to the initiation of the procedure. Initial ultrasound scanning demonstrates a small left-sided pleural effusion. The lower chest was prepped  and draped in the usual sterile fashion. 1% lidocaine was used for local anesthesia. An ultrasound image was saved for documentation purposes. An 8 Fr Safe-T-Centesis catheter was introduced. The thoracentesis was performed. The catheter was removed and a dressing was applied. The patient tolerated the procedure well without immediate post procedural complication. The patient was escorted to have an upright chest radiograph. FINDINGS: A total of approximately 250 cc of serous fluid was removed. IMPRESSION: Successful ultrasound-guided left sided thoracentesis yielding only 250 cc of pleural fluid. Electronically Signed   By: Sandi Mariscal M.D.   On: 03/09/2021 10:14   US THORACENTESIS ASP PLEURAL SPACE W/IMG GUIDE  Result Date: 03/08/2021 INDICATION: Patient with a history of metastatic breast cancer with recurrent bilateral pleural effusions. Interventional radiology asked to perform therapeutic thoracentesis. EXAM: ULTRASOUND GUIDED THORACENTESIS MEDICATIONS: 1% lidocaine 10 mL COMPLICATIONS: None immediate. PROCEDURE: An ultrasound guided thoracentesis was thoroughly discussed with the patient and questions answered. The benefits, risks, alternatives and complications were also discussed. The patient understands and wishes to proceed with the procedure. Written consent was obtained. Ultrasound was performed to localize and mark an adequate pocket of fluid in the right chest. The area was then prepped and draped in  the normal sterile fashion. 1% Lidocaine was used for local anesthesia. Under ultrasound guidance a 6 Fr Safe-T-Centesis catheter was introduced. Thoracentesis was performed. The catheter was removed and a dressing applied. FINDINGS: A total of approximately 775 mL of amber-colored fluid was removed. IMPRESSION: Successful ultrasound guided right thoracentesis yielding 775 mL of pleural fluid. Read by: Soyla Dryer, NP Electronically Signed   By: Markus Daft M.D.   On: 03/08/2021 12:49   US THORACENTESIS ASP PLEURAL SPACE W/IMG GUIDE  Result Date: 02/28/2021 INDICATION: Patient with history of recurrent metastatic breast cancer with recurrentpleural effusion. Request is for therapeutic right-sided thoracentesis EXAM: ULTRASOUND GUIDED THERAPEUTIC THORACENTESIS MEDICATIONS: Lidocaine 1% 10 mL COMPLICATIONS: None immediate. PROCEDURE: An ultrasound guided thoracentesis was thoroughly discussed with the patient and questions answered. The benefits, risks, alternatives and complications were also discussed. The patient understands and wishes to proceed with the procedure. Written consent was obtained. Ultrasound was performed to localize and mark an adequate pocket of fluid in the right chest. The area was then prepped and draped in the normal sterile fashion. 1% Lidocaine was used for local anesthesia. Under ultrasound guidance a 6 Fr Safe-T-Centesis catheter was introduced. Thoracentesis was performed. The catheter was removed and a dressing applied. FINDINGS: A total of approximately 650 mL of straw-colored fluid was removed. IMPRESSION: Successful ultrasound guided right-sided therapeutic thoracentesis yielding 650 mL of pleural fluid. Read by: Rushie Nyhan, NP Electronically Signed   By: Corrie Mckusick D.O.   On: 02/28/2021 15:21   US THORACENTESIS ASP PLEURAL SPACE W/IMG GUIDE  Result Date: 02/23/2021 INDICATION: Metastatic breast cancer. Bilateral pleural effusions. Right thoracentesis performed on  02/21/2021. Patient returns today for left thoracentesis. EXAM: ULTRASOUND GUIDED LEFT THORACENTESIS MEDICATIONS: None. COMPLICATIONS: SIR Level A - No therapy, no consequence. PROCEDURE: An ultrasound guided thoracentesis was thoroughly discussed with the patient and questions answered. The benefits, risks, alternatives and complications were also discussed. The patient understands and wishes to proceed with the procedure. Written consent was obtained. Ultrasound was performed to localize and mark an adequate pocket of fluid in the left chest. The area was then prepped and draped in the normal sterile fashion. 1% Lidocaine was used for local anesthesia. Under ultrasound visualization a 19 gauge, 7-cm, Yueh catheter was introduced. Thoracentesis was performed.  The catheter was removed and a dressing applied. FINDINGS: A total of approximately 110 mL of straw-colored fluid was removed. Samples were sent to the laboratory as requested by the clinical team. IMPRESSION: Successful ultrasound guided left thoracentesis yielding 110 mL of pleural fluid. Electronically Signed   By: Miachel Roux M.D.   On: 02/23/2021 10:52   US THORACENTESIS ASP PLEURAL SPACE W/IMG GUIDE  Result Date: 02/21/2021 INDICATION: Patient with history of recurrent metastatic breast cancer. Found to have pleural effusion. Request is for therapeutic and diagnostic thoracentesis EXAM: ULTRASOUND GUIDED THERAPEUTIC AND DIAGNOSTIC THORACENTESIS MEDICATIONS: Lidocaine 1% 10 mL COMPLICATIONS: None immediate. PROCEDURE: An ultrasound guided thoracentesis was thoroughly discussed with the patient and questions answered. The benefits, risks, alternatives and complications were also discussed. The patient understands and wishes to proceed with the procedure. Written consent was obtained. Ultrasound was performed to localize and mark an adequate pocket of fluid in the right chest. The area was then prepped and draped in the normal sterile fashion. 1%  Lidocaine was used for local anesthesia. Under ultrasound guidance a 6 Fr Safe-T-Centesis catheter was introduced. Thoracentesis was performed. The catheter was removed and a dressing applied. FINDINGS: A total of approximately 550 mL of straw-colored fluid was removed. Samples were sent to the laboratory as requested by the clinical team. IMPRESSION: Successful ultrasound guided therapeutic and diagnostic right-sided thoracentesis yielding 550 mL of pleural fluid. Read by: Rushie Nyhan, NP Electronically Signed   By: Jerilynn Mages.  Shick M.D.   On: 02/21/2021 15:55    Labs:  CBC: Recent Labs    02/04/21 1112 02/15/21 0931 02/24/21 0830 03/10/21 0831  WBC 60.4* 9.9 6.9 9.4  HGB 8.7* 9.5* 8.9* 9.6*  HCT 27.0* 29.7* 28.1* 31.1*  PLT 131* 274 162 315    COAGS: No results for input(s): INR, APTT in the last 8760 hours.  BMP: Recent Labs    04/29/20 1351 05/26/20 1416 06/23/20 1414 08/10/20 1134 10/12/20 0825 02/04/21 1112 02/15/21 0931 02/24/21 0830 03/10/21 0831  NA 142 140 137 137   < > 139 135 137 136  K 3.9 3.7 3.8 3.8   < > 3.7 4.1 4.1 4.1  CL 104 104 104 101   < > 105 102 102 104  CO2 $Re'30 27 27 28   'IjD$ < > $R'25 25 25 'Ch$ 21*  GLUCOSE 100* 141* 152* 124*   < > 77 111* 130* 139*  BUN $Re'15 11 12 14   'zvA$ < > $R'18 15 15 15  'os$ CALCIUM 8.9 8.7* 8.4* 8.7*   < > 9.1 8.7* 8.8* 8.5*  CREATININE 0.89 0.90 0.78 0.72   < > 0.80 0.74 0.83 0.87  GFRNONAA >60 >60 >60 >60   < > >60 >60 >60 >60  GFRAA >60 >60 >60 >60  --   --   --   --   --    < > = values in this interval not displayed.    LIVER FUNCTION TESTS: Recent Labs    01/25/21 0932 02/15/21 0931 02/24/21 0830 03/10/21 0831  BILITOT 0.6 0.5 0.5 0.3  AST $Re'30 25 31 29  'rXw$ ALT $R'28 21 29 19  'Ic$ ALKPHOS 133* 108 82 84  PROT 6.8 7.0 6.6 6.8  ALBUMIN 3.4* 3.4* 3.0* 2.8*    TUMOR MARKERS: No results for input(s): AFPTM, CEA, CA199, CHROMGRNA in the last 8760 hours.  Assessment and Plan: Patient with past medical history of triple negative breast  cancer s/p multiple interventions without response presents with complaint of recurrent right pleural  effusion.  IR consulted for PleurX placement at the request of Dr. Janese Banks. Case reviewed by Dr. Kathlene Cote who approves patient for procedure.  Patient presents today in their usual state of health.  She has been NPO and is not currently on blood thinners.  Hospice is planning to visit tomorrow for initial assessment and supply distribution.   Risks and benefits discussed with the patient including bleeding, infection, damage to adjacent structures, bowel perforation/fistula connection, and sepsis.  All of the patient's questions were answered, patient is agreeable to proceed. Consent signed and in chart.  Thank you for this interesting consult.  I greatly enjoyed meeting Khalis A Donahoo and look forward to participating in their care.  A copy of this report was sent to the requesting provider on this date.  Electronically Signed: Docia Barrier, PA 03/18/2021, 10:53 AM   I spent a total of  30 Minutes   in face to face in clinical consultation, greater than 50% of which was counseling/coordinating care for recurrent right malignant pleural effusion.

## 2021-03-18 NOTE — Progress Notes (Signed)
Patient clinically stable post Pleurx catheter placement per DR Kathlene Cote, tolerated with vitals stable pre and post procedure. Report given to Fransico Michael RN in specials post procedure/specials.

## 2021-03-18 NOTE — Progress Notes (Signed)
Pt. O2 sats. On room air : 87-88%. Spoke with Dr. Kathlene Cote re: dyspneic with any exertion. O2 back on at 1 L/[place]. Portable CxR ordered now. Pt. And spouse made aware. Cancer center RN phoned also by Philemon Kingdom, RN to see if O2 available for home use.

## 2021-03-18 NOTE — Procedures (Signed)
Interventional Radiology Procedure Note  Procedure: Tunneled right pleural PleurX catheter placement  Complications: None  Estimated Blood Loss: < 10 mL  Findings: 15 Fr tunneled PleurX catheter placed in right pleural space from lower mid-axillary approach. 700 mL of yellowish fluid removed after placement.  Venetia Night. Kathlene Cote, M.D Pager:  [phone]

## 2021-03-22 ENCOUNTER — Other Ambulatory Visit: Payer: Self-pay | Admitting: Student

## 2021-03-22 ENCOUNTER — Other Ambulatory Visit: Payer: Self-pay | Admitting: Oncology

## 2021-03-22 ENCOUNTER — Telehealth: Payer: Self-pay | Admitting: *Deleted

## 2021-03-22 ENCOUNTER — Ambulatory Visit
Admission: RE | Admit: 2021-03-22 | Discharge: 2021-03-22 | Disposition: A | Discharge status: Home / Self Care | Source: Ambulatory Visit | Attending: Oncology | Admitting: Oncology

## 2021-03-22 ENCOUNTER — Other Ambulatory Visit
Admission: RE | Admit: 2021-03-22 | Discharge: 2021-03-22 | Disposition: A | Discharge status: Home / Self Care | Source: Ambulatory Visit | Attending: Oncology | Admitting: Oncology

## 2021-03-22 ENCOUNTER — Ambulatory Visit
Admission: RE | Admit: 2021-03-22 | Discharge: 2021-03-22 | Disposition: A | Discharge status: Home / Self Care | Source: Ambulatory Visit | Attending: Student | Admitting: Student

## 2021-03-22 ENCOUNTER — Other Ambulatory Visit: Payer: Self-pay | Admitting: *Deleted

## 2021-03-22 ENCOUNTER — Inpatient Hospital Stay: Payer: Medicare Other

## 2021-03-22 ENCOUNTER — Other Ambulatory Visit: Payer: Self-pay

## 2021-03-22 DIAGNOSIS — R5383 Other fatigue: Secondary | ICD-10-CM | POA: Diagnosis not present

## 2021-03-22 DIAGNOSIS — Z9889 Other specified postprocedural states: Secondary | ICD-10-CM | POA: Insufficient documentation

## 2021-03-22 DIAGNOSIS — Z79899 Other long term (current) drug therapy: Secondary | ICD-10-CM | POA: Diagnosis not present

## 2021-03-22 DIAGNOSIS — J90 Pleural effusion, not elsewhere classified: Secondary | ICD-10-CM

## 2021-03-22 DIAGNOSIS — R221 Localized swelling, mass and lump, neck: Secondary | ICD-10-CM | POA: Diagnosis not present

## 2021-03-22 DIAGNOSIS — C787 Secondary malignant neoplasm of liver and intrahepatic bile duct: Secondary | ICD-10-CM | POA: Diagnosis not present

## 2021-03-22 DIAGNOSIS — C50511 Malignant neoplasm of lower-outer quadrant of right female breast: Secondary | ICD-10-CM | POA: Diagnosis not present

## 2021-03-22 DIAGNOSIS — C773 Secondary and unspecified malignant neoplasm of axilla and upper limb lymph nodes: Secondary | ICD-10-CM | POA: Diagnosis not present

## 2021-03-22 DIAGNOSIS — Z171 Estrogen receptor negative status [ER-]: Secondary | ICD-10-CM | POA: Diagnosis not present

## 2021-03-22 DIAGNOSIS — G893 Neoplasm related pain (acute) (chronic): Secondary | ICD-10-CM

## 2021-03-22 DIAGNOSIS — C50919 Malignant neoplasm of unspecified site of unspecified female breast: Secondary | ICD-10-CM

## 2021-03-22 DIAGNOSIS — R918 Other nonspecific abnormal finding of lung field: Secondary | ICD-10-CM | POA: Diagnosis not present

## 2021-03-22 DIAGNOSIS — R5381 Other malaise: Secondary | ICD-10-CM | POA: Diagnosis not present

## 2021-03-22 DIAGNOSIS — Z452 Encounter for adjustment and management of vascular access device: Secondary | ICD-10-CM | POA: Diagnosis not present

## 2021-03-22 DIAGNOSIS — D6481 Anemia due to antineoplastic chemotherapy: Secondary | ICD-10-CM | POA: Diagnosis not present

## 2021-03-22 DIAGNOSIS — Z20822 Contact with and (suspected) exposure to covid-19: Secondary | ICD-10-CM | POA: Insufficient documentation

## 2021-03-22 DIAGNOSIS — Z800 Family history of malignant neoplasm of digestive organs: Secondary | ICD-10-CM | POA: Diagnosis not present

## 2021-03-22 DIAGNOSIS — Z9221 Personal history of antineoplastic chemotherapy: Secondary | ICD-10-CM | POA: Diagnosis not present

## 2021-03-22 DIAGNOSIS — Z9013 Acquired absence of bilateral breasts and nipples: Secondary | ICD-10-CM | POA: Diagnosis not present

## 2021-03-22 DIAGNOSIS — T451X5A Adverse effect of antineoplastic and immunosuppressive drugs, initial encounter: Secondary | ICD-10-CM | POA: Diagnosis not present

## 2021-03-22 DIAGNOSIS — Z803 Family history of malignant neoplasm of breast: Secondary | ICD-10-CM | POA: Diagnosis not present

## 2021-03-22 LAB — HEMOGLOBIN: Hemoglobin: 10.3 g/dL — ABNORMAL LOW (ref 12.0–15.0)

## 2021-03-22 LAB — SARS CORONAVIRUS 2 BY RT PCR (HOSPITAL ORDER, PERFORMED IN ~~LOC~~ HOSPITAL LAB): SARS Coronavirus 2: NEGATIVE

## 2021-03-22 NOTE — Procedures (Signed)
PROCEDURE SUMMARY:  Successful US guided left thoracentesis. Yielded 450 ml of clear yellow fluid. Pt tolerated procedure well. No immediate complications.  CXR ordered; no post-procedure pneumothorax identified  EBL < 2 mL  Theresa Duty, NP 03/22/2021 3:06 PM

## 2021-03-22 NOTE — Telephone Encounter (Signed)
Got a call from [facility] for IR for thoracentesis. I needed to put covid test today and call pt. I called pt and she has appt this afternoon for thoracentesis of left lung today. I called pt. And let her know that she needs to come over and get stat test for the thoracentesis and she says that is sob more than usual and she got the pleurex on right side done yest. She is on oxygen and she is still sob. That is why she wanted the thoracentesis on left today. Then I got a call from [place] and her husband wanted to check hgb to see if that may be part of the problem. I asked rao and she said that it would be faster if pt came to cancer center and got hgb checked. I added it on and Horris Latino called pt. I now have the hgb and it was 10.3. I have called bonnie back and told her the hgb level and faxed it also to bonnie at [phone]

## 2021-03-23 ENCOUNTER — Other Ambulatory Visit: Payer: Self-pay | Admitting: Oncology

## 2021-03-23 ENCOUNTER — Ambulatory Visit: Admission: RE | Admit: 2021-03-23 | Payer: Medicare Other | Source: Ambulatory Visit

## 2021-03-24 ENCOUNTER — Telehealth: Payer: Self-pay | Admitting: *Deleted

## 2021-03-24 NOTE — Telephone Encounter (Signed)
Can you call Debra Little?

## 2021-03-24 NOTE — Telephone Encounter (Signed)
Otila Kluver with hosoice called stating that nurse is on way to [facility] home and is needing orders for draining the 2 pleurex caths/ She said that their protocol for in home drainage is 1000 ml, but they have never had a patient with bilateral caths and need clarification of how much which side etc Otila Kluver [phone]

## 2021-03-24 NOTE — Telephone Encounter (Signed)
Called tina and the pt got thoracentesis 4/26 400 ml from left side but no pleurex. The pleurex is on right side only and and per tina in the home the most that it is 1049ml  Three times a week but if needed can be 1000 ml daily. If pt needs the left side done then they will need to call us for order to get left side drained. Otila Kluver will let us know when she needs another one

## 2021-03-25 ENCOUNTER — Other Ambulatory Visit: Payer: PPO

## 2021-04-12 ENCOUNTER — Ambulatory Visit: Payer: PPO

## 2021-04-27 DEATH — deceased

## 2021-06-13 ENCOUNTER — Ambulatory Visit: Payer: Medicare Other | Admitting: Radiation Oncology

## 2022-10-04 IMAGING — CT CT CHEST-ABD-PELV W/ CM
3 of 7 series · 9 of 36 positions shown, 13 images · IV contrast (omnipaque)
Comparison: CT scan 07/07/2020 and PET-CT 12/09/2020

CLINICAL DATA: History of metastatic breast cancer. Increased
swelling in the right upper chest area.

EXAM:
CT CHEST, ABDOMEN, AND PELVIS WITH CONTRAST
TECHNIQUE: Multidetector CT imaging of the chest, abdomen and pelvis was
performed following the standard protocol during bolus
administration of intravenous contrast.
CONTRAST:  100mL OMNIPAQUE IOHEXOL 300 MG/ML  SOLN

[Series 2: axials cap 5.00 · axial · 0.64mm/px · z∈[-1287,-1077]mm · 2 of 127 slices shown, 5 images]
[im 43/127  mediastinal]
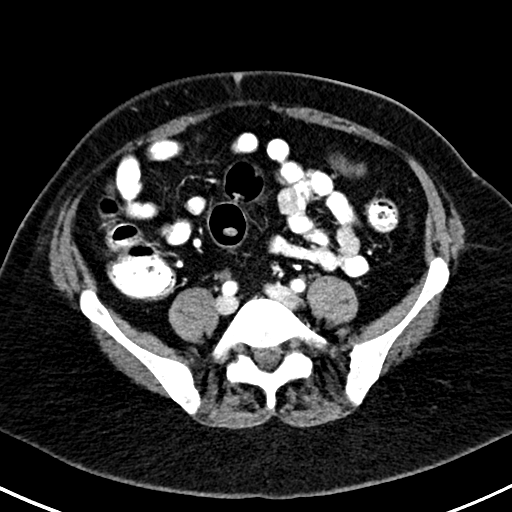
[im 43/127  lung]
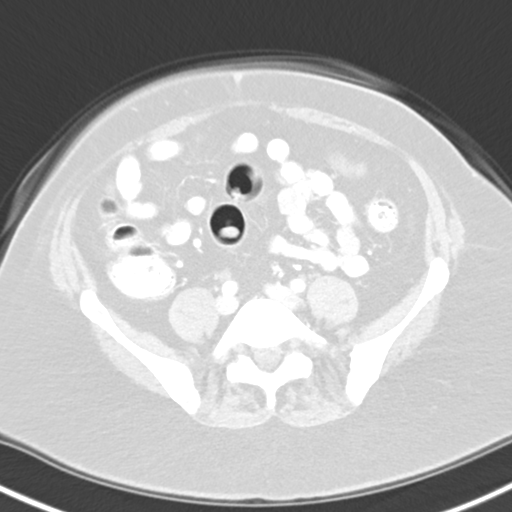
[im 43/127  bone]
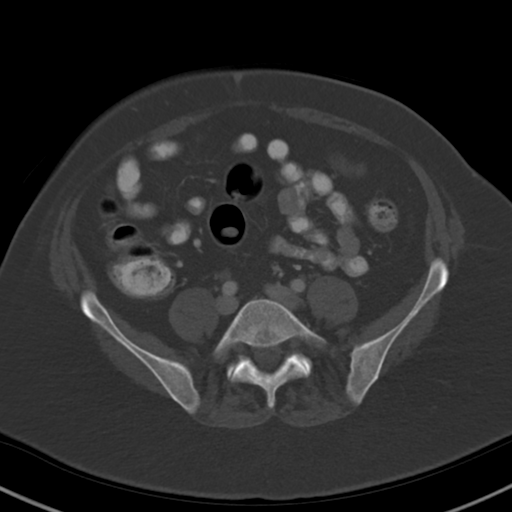
[im 85/127  mediastinal]
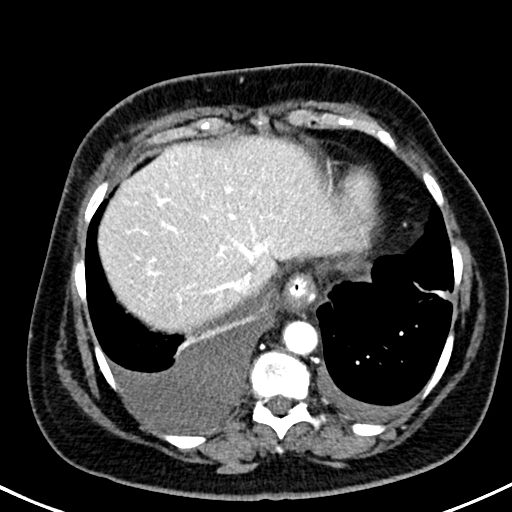
[im 85/127  lung]
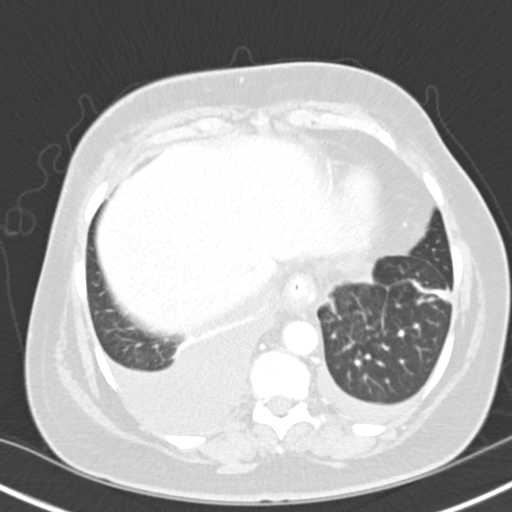

[Series 5: coronals cap 2.00 cor · coronal · 0.64mm/px · 3 of 152 slices shown, 4 images]
[im 31/152  mediastinal]
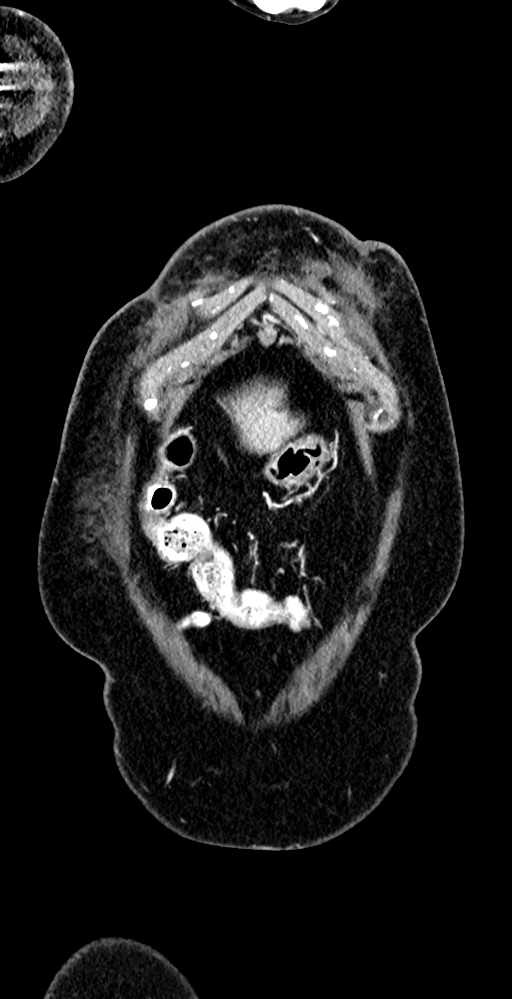
[im 61/152  mediastinal]
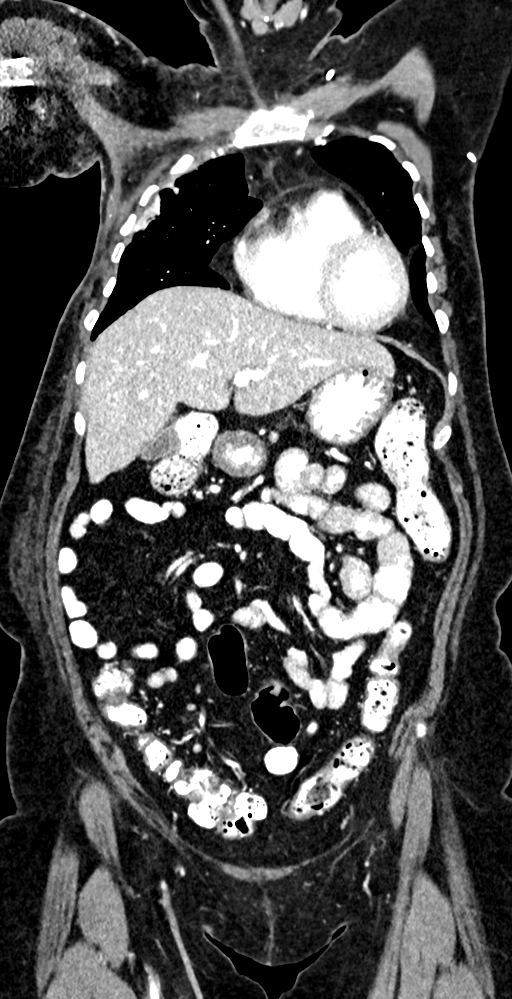
[im 61/152  bone]
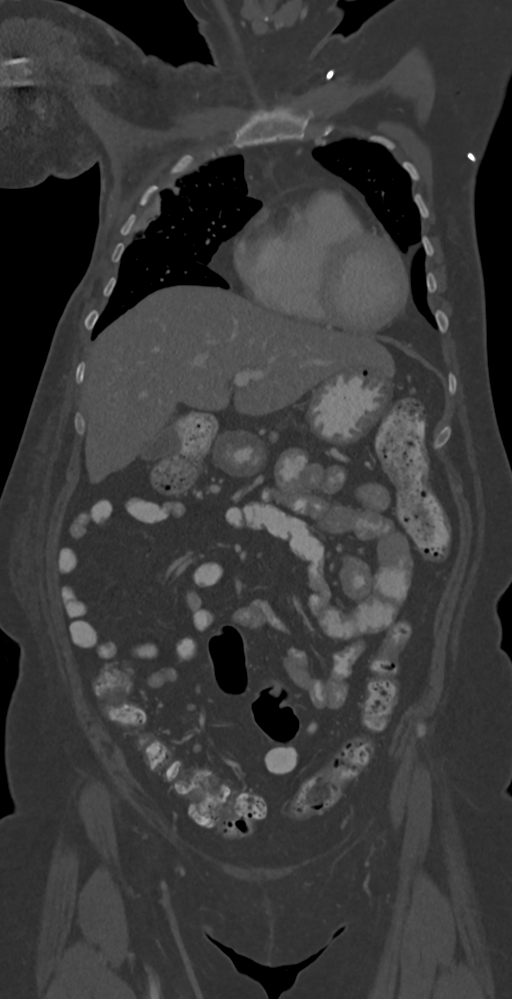
[im 91/152  mediastinal]
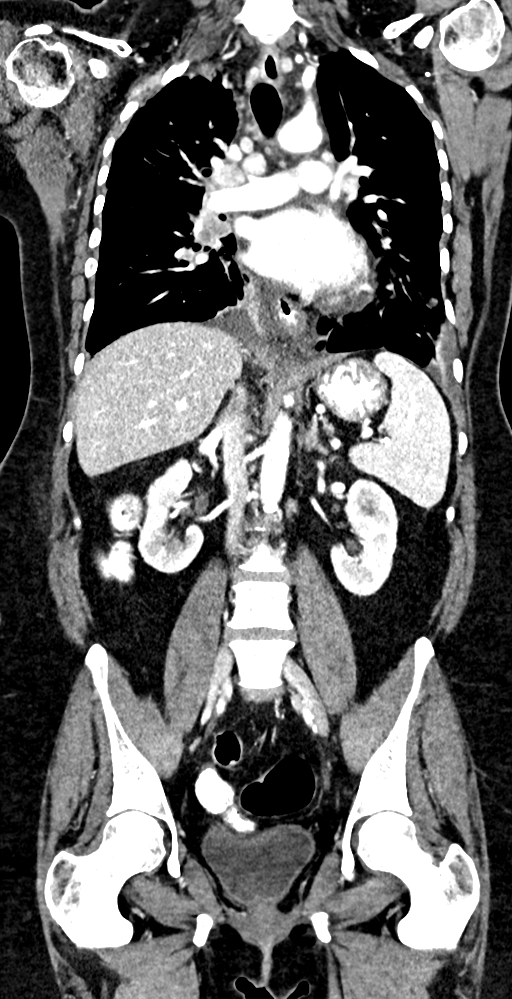

[Series 11: bone windows cap 2.00 · axial · 0.65mm/px · z∈[-1438,-1248]mm · 4 of 318 slices shown]
[im 32/318  bone]
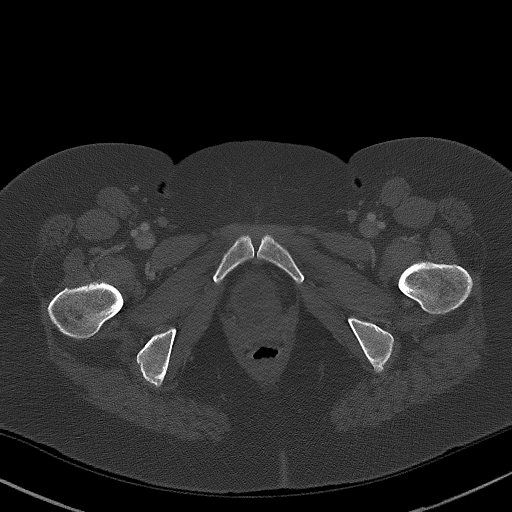
[im 64/318  bone]
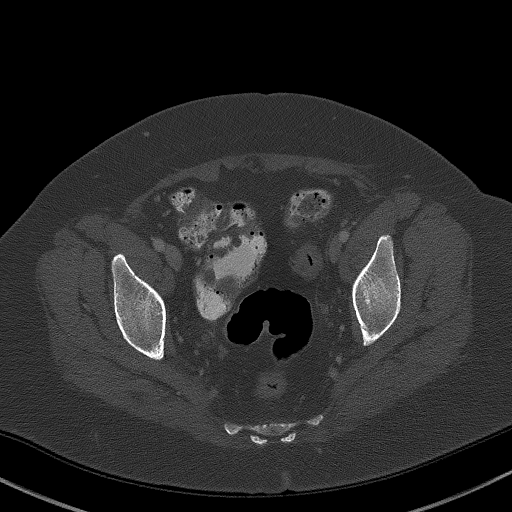
[im 96/318  bone]
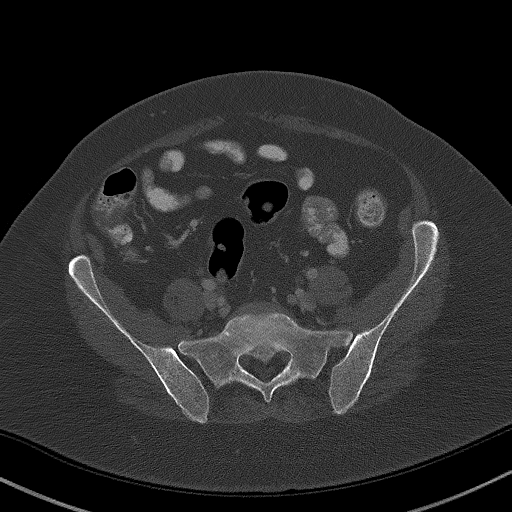
[im 127/318  bone]
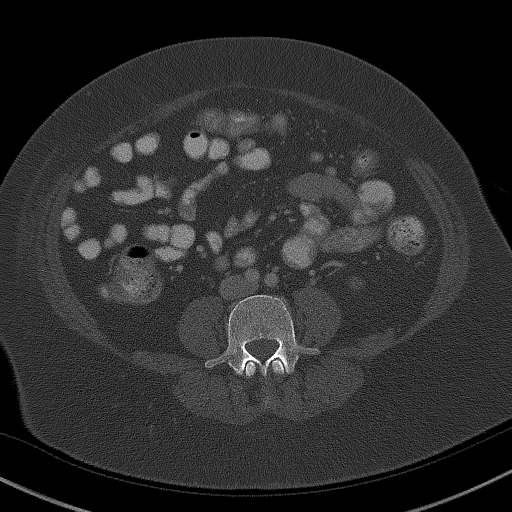

[9 of 36 positions shown; findings below may reference images not displayed]

FINDINGS: CT CHEST FINDINGS

Cardiovascular: Heart is normal in size. No pericardial effusion.
The aorta is normal in caliber. No dissection. The branch vessels
are patent. No definite coronary artery calcifications.

Mediastinum/Nodes: New mediastinal and hilar lymphadenopathy. Some
of the adenopathy appears slightly necrotic.

14 mm precarinal lymph node on image [DATE]. This previously measured
6 mm.

10 mm right hilar node on image [DATE]

11.5 mm right-sided subcarinal node on image number [DATE]

Smaller bilateral hilar lymph nodes are also new and appears
somewhat necrotic.

There is moderate contrast in the esophagus which could suggest
reflux.

Lungs/Pleura: New bilateral pleural effusions with overlying
atelectasis. 15 mm subpleural density in the right upper lobe
anteriorly has vessels entering in it and could be an area of
rounded atelectasis. Other smaller pleural or subpleural lesions
somewhat worrisome for pleural mets. A right-sided thoracentesis may
be helpful. Do not see any definite metastatic pulmonary nodules.
Patchy ground-glass opacity could suggest edema or inflammation.
Stable apical scarring changes.

Musculoskeletal: There is a rim enhancing low-attenuation lesion in
the right axilla located between surgical clips. This was also noted
on the prior PET-CT and was hypermetabolic. Findings consistent with
necrotic tumor. This measures a maximum of 2.8 x 2.5 cm. It
previously measured approximately 18 x 17 mm.

Irregular enhancing lesion involves the right chest wall anteriorly
near the sternum. This measures a maximum of 25 mm image [DATE]. This
previously measured 18.5 mm. Slightly more inferiorly and medially
there is a rim enhancing necrotic appearing mass measuring 3.3 x
cm. This is causing erosion of the right aspect of the sternum and
likely originated in the bone. It previously measured 2.7 x 2.0 cm.

Stable elongated fluid collection involving the right chest wall
likely a postoperative seroma.

I do not see any lytic or sclerotic vertebral body lesions to
suggest metastatic disease. The ribs appear intact.

CT ABDOMEN PELVIS FINDINGS

Hepatobiliary: No hepatic lesions to suggest metastatic disease. No
intrahepatic biliary dilatation. The gallbladder is unremarkable. No
common bile duct dilatation.

Pancreas: No mass, inflammation or ductal dilatation.

Spleen: Normal size. Few small splenic lesions are stable and likely
benign.

Adrenals/Urinary Tract: Adrenal glands are normal. No renal lesions
or hydronephrosis. Bladder is unremarkable.

Stomach/Bowel: The stomach, duodenum, small bowel and colon are
unremarkable. No acute inflammatory changes, mass lesions or
obstructive findings.

Vascular/Lymphatic: Stable scattered vascular calcifications. No
aneurysm or dissection. The branch vessels are patent.

Borderline enlarged celiac axis and periportal lymph nodes are
noted. No retroperitoneal lymphadenopathy. No pelvic
lymphadenopathy.

Reproductive: Surgically absent.

Other: No pelvic mass or adenopathy. No free pelvic fluid
collections. No inguinal mass or adenopathy. No abdominal wall
hernia or subcutaneous lesions.

Musculoskeletal: No lytic or sclerotic bone lesions involving the
lumbar spine or pelvis are identified.
IMPRESSION: 1. New and progressive mediastinal and hilar lymphadenopathy. Some
of the adenopathy appears slightly necrotic.
2. Slightly larger rim enhancing necrotic mass in the right axilla.
3. Two enlarging adjacent necrotic appearing lesions involving the
right chest wall. The larger lesion is causing erosion of the right
aspect of the sternum (or originated in the sternum).
4. New bilateral pleural effusions with overlying atelectasis.
5. 15 mm subpleural density in the right upper lobe anteriorly has
vessels entering in it and could be an area of rounded atelectasis.
Other smaller pleural or subpleural lesions somewhat worrisome for
pleural mets. A right-sided thoracentesis may be helpful.
6. Borderline enlarged celiac axis and periportal lymph nodes. No
other findings for metastatic disease involving the abdomen/pelvis.
7. Stable elongated fluid collection involving the right chest wall,
likely a postoperative seroma.
